# Patient Record
Sex: Male | Born: 1935
Health system: Southern US, Community
[De-identification: ages and names within clinical notes are randomized; demographics above are authoritative.]

## PROBLEM LIST (undated history)

## (undated) ENCOUNTER — Emergency Department (HOSPITAL_BASED_OUTPATIENT_CLINIC_OR_DEPARTMENT_OTHER): Payer: Medicare Other

## (undated) DIAGNOSIS — R001 Bradycardia, unspecified: Secondary | ICD-10-CM

## (undated) DIAGNOSIS — C61 Malignant neoplasm of prostate: Secondary | ICD-10-CM

## (undated) DIAGNOSIS — E785 Hyperlipidemia, unspecified: Secondary | ICD-10-CM

## (undated) DIAGNOSIS — J189 Pneumonia, unspecified organism: Secondary | ICD-10-CM

## (undated) DIAGNOSIS — I480 Paroxysmal atrial fibrillation: Secondary | ICD-10-CM

## (undated) DIAGNOSIS — I5042 Chronic combined systolic (congestive) and diastolic (congestive) heart failure: Secondary | ICD-10-CM

## (undated) DIAGNOSIS — I10 Essential (primary) hypertension: Secondary | ICD-10-CM

## (undated) DIAGNOSIS — M8580 Other specified disorders of bone density and structure, unspecified site: Secondary | ICD-10-CM

## (undated) DIAGNOSIS — Z8719 Personal history of other diseases of the digestive system: Secondary | ICD-10-CM

## (undated) DIAGNOSIS — N182 Chronic kidney disease, stage 2 (mild): Secondary | ICD-10-CM

## (undated) DIAGNOSIS — I509 Heart failure, unspecified: Secondary | ICD-10-CM

## (undated) DIAGNOSIS — R609 Edema, unspecified: Secondary | ICD-10-CM

## (undated) DIAGNOSIS — E669 Obesity, unspecified: Secondary | ICD-10-CM

## (undated) DIAGNOSIS — C4490 Unspecified malignant neoplasm of skin, unspecified: Secondary | ICD-10-CM

## (undated) DIAGNOSIS — R0600 Dyspnea, unspecified: Secondary | ICD-10-CM

## (undated) DIAGNOSIS — R0609 Other forms of dyspnea: Secondary | ICD-10-CM

## (undated) DIAGNOSIS — Z950 Presence of cardiac pacemaker: Secondary | ICD-10-CM

## (undated) DIAGNOSIS — I2510 Atherosclerotic heart disease of native coronary artery without angina pectoris: Secondary | ICD-10-CM

## (undated) DIAGNOSIS — D126 Benign neoplasm of colon, unspecified: Secondary | ICD-10-CM

## (undated) DIAGNOSIS — J45909 Unspecified asthma, uncomplicated: Secondary | ICD-10-CM

## (undated) HISTORY — PX: C-EYE SURGERY PROCEDURE: 102257504

## (undated) HISTORY — PX: ANTERIOR CERVICAL DECOMP/DISCECTOMY FUSION: SHX1161

## (undated) HISTORY — PX: PROSTATE BIOPSY: SHX241

## (undated) HISTORY — PX: BACK SURGERY: SHX140

## (undated) HISTORY — DX: Essential (primary) hypertension: I10

## (undated) HISTORY — DX: Malignant neoplasm of prostate: C61

## (undated) HISTORY — PX: EYE SURGERY: SHX253

## (undated) HISTORY — PX: MOHS SURGERY: SUR867

## (undated) HISTORY — PX: TRANSURETHRAL RESECTION OF PROSTATE: SHX73

## (undated) HISTORY — DX: Obesity, unspecified: E66.9

## (undated) HISTORY — DX: Hyperlipidemia, unspecified: E78.5

## (undated) HISTORY — DX: Edema, unspecified: R60.9

## (undated) HISTORY — DX: Atherosclerotic heart disease of native coronary artery without angina pectoris: I25.10

## (undated) HISTORY — PX: PACEMAKER INSERTION: SHX728

## (undated) HISTORY — PX: CATARACT EXTRACTION: SUR2

## (undated) HISTORY — DX: Benign neoplasm of colon, unspecified: D12.6

## (undated) HISTORY — DX: Other specified disorders of bone density and structure, unspecified site: M85.80

## (undated) HISTORY — DX: Other forms of dyspnea: R06.09

---

## 2000-08-02 ENCOUNTER — Emergency Department (HOSPITAL_COMMUNITY): Admission: EM | Admit: 2000-08-02 | Discharge: 2000-08-02 | Payer: Self-pay | Admitting: Emergency Medicine

## 2000-08-02 ENCOUNTER — Encounter: Payer: Self-pay | Admitting: Emergency Medicine

## 2002-08-06 ENCOUNTER — Encounter: Payer: Self-pay | Admitting: Urology

## 2002-08-09 ENCOUNTER — Observation Stay (HOSPITAL_COMMUNITY): Admission: RE | Admit: 2002-08-09 | Discharge: 2002-08-10 | Payer: Self-pay | Admitting: Urology

## 2002-08-09 ENCOUNTER — Encounter (INDEPENDENT_AMBULATORY_CARE_PROVIDER_SITE_OTHER): Payer: Self-pay

## 2002-11-16 ENCOUNTER — Ambulatory Visit (HOSPITAL_COMMUNITY): Admission: RE | Admit: 2002-11-16 | Discharge: 2002-11-16 | Payer: Self-pay | Admitting: Urology

## 2002-11-16 ENCOUNTER — Encounter: Payer: Self-pay | Admitting: Urology

## 2002-12-16 ENCOUNTER — Encounter: Payer: Self-pay | Admitting: Emergency Medicine

## 2002-12-16 ENCOUNTER — Emergency Department (HOSPITAL_COMMUNITY): Admission: EM | Admit: 2002-12-16 | Discharge: 2002-12-16 | Payer: Self-pay | Admitting: Emergency Medicine

## 2004-07-11 ENCOUNTER — Ambulatory Visit: Payer: Self-pay | Admitting: Gastroenterology

## 2006-04-10 ENCOUNTER — Emergency Department (HOSPITAL_COMMUNITY): Admission: EM | Admit: 2006-04-10 | Discharge: 2006-04-10 | Payer: Self-pay | Admitting: Emergency Medicine

## 2006-05-16 ENCOUNTER — Ambulatory Visit: Payer: Self-pay

## 2007-04-13 ENCOUNTER — Emergency Department (HOSPITAL_COMMUNITY): Admission: EM | Admit: 2007-04-13 | Discharge: 2007-04-13 | Payer: Self-pay | Admitting: Emergency Medicine

## 2008-09-12 ENCOUNTER — Ambulatory Visit (HOSPITAL_COMMUNITY): Admission: RE | Admit: 2008-09-12 | Discharge: 2008-09-12 | Payer: Self-pay | Admitting: Family Medicine

## 2009-06-28 ENCOUNTER — Encounter (INDEPENDENT_AMBULATORY_CARE_PROVIDER_SITE_OTHER): Payer: Self-pay | Admitting: *Deleted

## 2009-10-04 ENCOUNTER — Emergency Department (HOSPITAL_COMMUNITY): Admission: EM | Admit: 2009-10-04 | Discharge: 2009-10-04 | Payer: Self-pay | Admitting: Emergency Medicine

## 2009-12-04 ENCOUNTER — Encounter: Payer: Self-pay | Admitting: Gastroenterology

## 2009-12-04 DIAGNOSIS — Z86010 Personal history of colonic polyps: Secondary | ICD-10-CM

## 2009-12-08 ENCOUNTER — Telehealth: Payer: Self-pay | Admitting: Gastroenterology

## 2009-12-13 ENCOUNTER — Encounter: Payer: Self-pay | Admitting: Cardiovascular Disease

## 2009-12-25 ENCOUNTER — Telehealth: Payer: Self-pay | Admitting: Gastroenterology

## 2009-12-27 DIAGNOSIS — R5383 Other fatigue: Secondary | ICD-10-CM

## 2009-12-27 DIAGNOSIS — J40 Bronchitis, not specified as acute or chronic: Secondary | ICD-10-CM

## 2009-12-27 DIAGNOSIS — D126 Benign neoplasm of colon, unspecified: Secondary | ICD-10-CM | POA: Insufficient documentation

## 2009-12-27 DIAGNOSIS — M949 Disorder of cartilage, unspecified: Secondary | ICD-10-CM

## 2009-12-27 DIAGNOSIS — J438 Other emphysema: Secondary | ICD-10-CM | POA: Insufficient documentation

## 2009-12-27 DIAGNOSIS — E785 Hyperlipidemia, unspecified: Secondary | ICD-10-CM | POA: Insufficient documentation

## 2009-12-27 DIAGNOSIS — M899 Disorder of bone, unspecified: Secondary | ICD-10-CM | POA: Insufficient documentation

## 2009-12-27 DIAGNOSIS — R5381 Other malaise: Secondary | ICD-10-CM | POA: Insufficient documentation

## 2009-12-27 DIAGNOSIS — R071 Chest pain on breathing: Secondary | ICD-10-CM

## 2009-12-27 DIAGNOSIS — I10 Essential (primary) hypertension: Secondary | ICD-10-CM | POA: Insufficient documentation

## 2009-12-27 DIAGNOSIS — C61 Malignant neoplasm of prostate: Secondary | ICD-10-CM

## 2009-12-27 DIAGNOSIS — R079 Chest pain, unspecified: Secondary | ICD-10-CM

## 2009-12-27 DIAGNOSIS — I5030 Unspecified diastolic (congestive) heart failure: Secondary | ICD-10-CM | POA: Insufficient documentation

## 2009-12-28 ENCOUNTER — Ambulatory Visit: Payer: Self-pay | Admitting: Cardiovascular Disease

## 2010-01-04 ENCOUNTER — Ambulatory Visit (HOSPITAL_COMMUNITY): Admission: RE | Admit: 2010-01-04 | Discharge: 2010-01-04 | Payer: Self-pay | Admitting: Cardiovascular Disease

## 2010-01-04 ENCOUNTER — Encounter (INDEPENDENT_AMBULATORY_CARE_PROVIDER_SITE_OTHER): Payer: Self-pay | Admitting: *Deleted

## 2010-01-04 ENCOUNTER — Ambulatory Visit: Payer: Self-pay | Admitting: Internal Medicine

## 2010-01-04 ENCOUNTER — Ambulatory Visit: Payer: Self-pay

## 2010-01-04 ENCOUNTER — Ambulatory Visit: Payer: Self-pay | Admitting: Cardiovascular Disease

## 2010-01-04 ENCOUNTER — Encounter: Payer: Self-pay | Admitting: Cardiovascular Disease

## 2010-02-09 ENCOUNTER — Ambulatory Visit: Payer: Self-pay | Admitting: Internal Medicine

## 2010-02-12 ENCOUNTER — Encounter: Payer: Self-pay | Admitting: Internal Medicine

## 2010-04-05 ENCOUNTER — Ambulatory Visit: Payer: Self-pay | Admitting: Cardiovascular Disease

## 2010-08-02 DIAGNOSIS — I2510 Atherosclerotic heart disease of native coronary artery without angina pectoris: Secondary | ICD-10-CM

## 2010-08-02 HISTORY — DX: Atherosclerotic heart disease of native coronary artery without angina pectoris: I25.10

## 2010-08-20 ENCOUNTER — Inpatient Hospital Stay (HOSPITAL_COMMUNITY)
Admission: EM | Admit: 2010-08-20 | Discharge: 2010-08-28 | Payer: Self-pay | Source: Home / Self Care | Attending: Thoracic Surgery (Cardiothoracic Vascular Surgery) | Admitting: Thoracic Surgery (Cardiothoracic Vascular Surgery)

## 2010-08-21 ENCOUNTER — Encounter: Payer: Self-pay | Admitting: Thoracic Surgery (Cardiothoracic Vascular Surgery)

## 2010-08-22 ENCOUNTER — Encounter: Payer: Self-pay | Admitting: Thoracic Surgery (Cardiothoracic Vascular Surgery)

## 2010-08-23 HISTORY — PX: CORONARY ARTERY BYPASS GRAFT: SHX141

## 2010-08-23 HISTORY — PX: MEDIAN STERNOTOMY: SUR860

## 2010-08-31 ENCOUNTER — Ambulatory Visit: Admission: RE | Admit: 2010-08-31 | Discharge: 2010-08-31 | Payer: Self-pay | Source: Home / Self Care

## 2010-09-04 ENCOUNTER — Encounter: Payer: Self-pay | Admitting: Cardiovascular Disease

## 2010-09-10 ENCOUNTER — Ambulatory Visit
Admission: RE | Admit: 2010-09-10 | Discharge: 2010-09-10 | Payer: Self-pay | Source: Home / Self Care | Attending: Thoracic Surgery (Cardiothoracic Vascular Surgery) | Admitting: Thoracic Surgery (Cardiothoracic Vascular Surgery)

## 2010-09-10 ENCOUNTER — Encounter: Payer: Self-pay | Admitting: Cardiovascular Disease

## 2010-09-10 ENCOUNTER — Encounter
Admission: RE | Admit: 2010-09-10 | Discharge: 2010-09-10 | Payer: Self-pay | Source: Home / Self Care | Attending: Thoracic Surgery (Cardiothoracic Vascular Surgery) | Admitting: Thoracic Surgery (Cardiothoracic Vascular Surgery)

## 2010-09-10 ENCOUNTER — Ambulatory Visit: Admission: RE | Admit: 2010-09-10 | Discharge: 2010-09-10 | Payer: Self-pay | Source: Home / Self Care

## 2010-09-10 ENCOUNTER — Encounter: Payer: Self-pay | Admitting: Cardiology

## 2010-09-17 ENCOUNTER — Ambulatory Visit: Admission: RE | Admit: 2010-09-17 | Discharge: 2010-09-17 | Payer: Self-pay | Source: Home / Self Care

## 2010-09-17 LAB — CONVERTED CEMR LAB: POC INR: 1.3

## 2010-09-20 ENCOUNTER — Ambulatory Visit
Admission: RE | Admit: 2010-09-20 | Discharge: 2010-09-20 | Payer: Self-pay | Source: Home / Self Care | Attending: Cardiovascular Disease | Admitting: Cardiovascular Disease

## 2010-09-20 ENCOUNTER — Encounter: Payer: Self-pay | Admitting: Cardiovascular Disease

## 2010-09-20 DIAGNOSIS — I480 Paroxysmal atrial fibrillation: Secondary | ICD-10-CM | POA: Insufficient documentation

## 2010-09-24 ENCOUNTER — Ambulatory Visit: Admit: 2010-09-24 | Payer: Self-pay

## 2010-09-24 ENCOUNTER — Telehealth: Payer: Self-pay | Admitting: Cardiovascular Disease

## 2010-09-27 ENCOUNTER — Encounter (HOSPITAL_COMMUNITY)
Admission: RE | Admit: 2010-09-27 | Discharge: 2010-10-02 | Payer: Self-pay | Source: Home / Self Care | Attending: Cardiovascular Disease | Admitting: Cardiovascular Disease

## 2010-09-27 ENCOUNTER — Telehealth (INDEPENDENT_AMBULATORY_CARE_PROVIDER_SITE_OTHER): Payer: Self-pay | Admitting: *Deleted

## 2010-10-02 NOTE — Letter (Signed)
Summary: Outpatient Coinsurance Notice  Outpatient Coinsurance Notice   Imported By: Marylou Mccoy 01/05/2010 15:08:18  _____________________________________________________________________  External Attachment:    Type:   Image     Comment:   External Document

## 2010-10-02 NOTE — Letter (Signed)
Summary: [facility] Instructions  [facility]  [address] [place], [place] [address]   Phone: [phone]  Fax: [phone]       DAVIDMICHAEL ZARAZUA    28-Dec-1928    MRN: [id number]        Procedure Day /Date: Thursday 12/14/2009     Arrival Time: 10:30am       Procedure Time: 11:30am     Location of Procedure:                     [facility] (4th Floor)                        PREPARATION FOR COLONOSCOPY WITH MOVIPREP   Starting 5 days prior to your procedure 4/9/201 do not eat nuts, seeds, popcorn, corn, beans, peas,  salads, or any raw vegetables.  Do not take any fiber supplements (e.g. Metamucil, Citrucel, and Benefiber).  THE DAY BEFORE YOUR PROCEDURE         DATE: 12/13/2009  DAY:  Wednesday   1.  Drink clear liquids the entire day-NO SOLID FOOD  2.  Do not drink anything colored red or purple.  Avoid juices with pulp.  No orange juice.  3.  Drink at least 64 oz. (8 glasses) of fluid/clear liquids during the day to prevent dehydration and help the prep work efficiently.  CLEAR LIQUIDS INCLUDE: Water Jello Ice Popsicles Tea (sugar ok, no milk/cream) Powdered fruit flavored drinks Coffee (sugar ok, no milk/cream) Gatorade Juice: apple, white grape, white cranberry  Lemonade Clear bullion, consomm, broth Carbonated beverages (any kind) Strained chicken noodle soup Hard Candy                             4.  In the morning, mix first dose of MoviPrep solution:    Empty 1 Pouch A and 1 Pouch B into the disposable container    Add lukewarm drinking water to the top line of the container. Mix to dissolve    Refrigerate (mixed solution should be used within 24 hrs)  5.  Begin drinking the prep at 5:00 p.m. The MoviPrep container is divided by 4 marks.   Every 15 minutes drink the solution down to the next mark (approximately 8 oz) until the full liter is complete.   6.  Follow completed prep with 16 oz of clear liquid of your choice  (Nothing red or purple).  Continue to drink clear liquids until bedtime.  7.  Before going to bed, mix second dose of MoviPrep solution:    Empty 1 Pouch A and 1 Pouch B into the disposable container    Add lukewarm drinking water to the top line of the container. Mix to dissolve    Refrigerate  THE DAY OF YOUR PROCEDURE      DATE: 12/14/2009 DAY: Thursday  Beginning at 6:30a.m. (5 hours before procedure):         1. Every 15 minutes, drink the solution down to the next mark (approx 8 oz) until the full liter is complete.  2. Follow completed prep with 16 oz. of clear liquid of your choice.    3. You may drink clear liquids until 9:30a.m. (2 HOURS BEFORE PROCEDURE).   MEDICATION INSTRUCTIONS  Unless otherwise instructed, you should take regular prescription medications with a small sip of water   as early as possible the morning of your  procedure.         OTHER INSTRUCTIONS  You will need a responsible adult at least 75 years of age to accompany you and drive you home.   This person must remain in the waiting room during your procedure.  Wear loose fitting clothing that is easily removed.  Leave jewelry and other valuables at home.  However, you may wish to bring a book to read or  an iPod/MP3 player to listen to music as you wait for your procedure to start.  Remove all body piercing jewelry and leave at home.  Total time from sign-in until discharge is approximately 2-3 hours.  You should go home directly after your procedure and rest.  You can resume normal activities the  day after your procedure.   The day of your procedure you should not:   Drive   Make legal decisions   Operate machinery   Drink alcohol   Return to work  You will receive specific instructions about eating, activities and medications before you leave.   The above instructions have been reviewed and explained to me by   Harlow Mares CMA Duncan Dull)  December 04, 2009 10:25 AM      I  fully understand and can verbalize these instructions _____________________________ Date 12/04/2009

## 2010-10-02 NOTE — Progress Notes (Signed)
Summary: TRIAGE-SOB/Colonoscopy Cx.  Phone Note Call from Patient Call back at Home Phone [phone]   Caller: wife, Windell Moulding Call For: DR.KAPLAN Reason for Call: Talk to Nurse Complaint: Breathing Problems Summary of Call: worried about health condition before having procedure... pt having trouble breathing Initial call taken by: Vallarie Mare,  December 08, 2009 11:14 AM  Follow-up for Phone Call        DR.KAPLAN--Pt. wife states pt. is having episodes of fatigue and SOB, seems to be getting worse. I have advised Mrs.Arviso to get pt. scheduled w/his PCP ASAP for eval. of c/o. Pt. is scheduled for a Colonoscopy on 12-14-09, do you want him to procede with appt., or r/s until after c/o have been evaluated by PCP?  Follow-up by: Laureen Ochs LPN,  December 08, 2009 11:48 AM  Additional Follow-up for Phone Call Additional follow up Details #1::        wife called back and said that pt told her to just cancel the COL until he sees a cardiologist... informed wife that she was going to be getting a call back from our office regarding this issue and advise pt due to the original message taken... wife said she didnt want to wait and was sure in pt's decision to cancel... procedure is cancelled in [facility]  Additional Follow-up by: Vallarie Mare,  December 08, 2009 2:06 PM    Additional Follow-up for Phone Call Additional follow up Details #2::    ok Follow-up by: Louis Meckel MD,  December 11, 2009 8:37 AM  Additional Follow-up for Phone Call Additional follow up Details #3:: Details for Additional Follow-up Action Taken: Pt. wife advised to callback to reschedule Colonoscopy when they are ready. Additional Follow-up by: Laureen Ochs LPN,  December 11, 2009 8:40 AM

## 2010-10-02 NOTE — Procedures (Signed)
Summary: Colonoscopy  Patient: Levi [name] Note: All result statuses are Final unless otherwise noted.  Tests: (1) Colonoscopy (COL)   COL Colonoscopy           DONE     [facility]     [address]     [place], [place]  [address]           COLONOSCOPY PROCEDURE REPORT           PATIENT:  Levi [name], Levi [name]  MR#:  [id number]     BIRTHDATE:  08/11/1929, 80 yrs. old  GENDER:  male     ENDOSCOPIST:  Hedwig Morton. Juanda Chance, MD     REF. BY:     PROCEDURE DATE:  02/09/2010     PROCEDURE:  Colonoscopy 19147     ASA CLASS:  Class II     INDICATIONS:  surveillance and high-risk screening adenom polyps     2005 x2     MEDICATIONS:   Versed 10 mg, Fentanyl 100 mcg           DESCRIPTION OF PROCEDURE:   After the risks benefits and     alternatives of the procedure were thoroughly explained, informed     consent was obtained.  Digital rectal exam was performed and     revealed no rectal masses.   The [id number] endoscope was     introduced through the anus and advanced to the cecum, which was     identified by the ileocecal valve, without limitations.  The     quality of the prep was good, using MiraLax.  The instrument was     then slowly withdrawn as the colon was fully examined.     <<PROCEDUREIMAGES>>           FINDINGS:  There were multiple polyps identified and removed.     throughout the colon. 2 polyps right colon at 85 and 90 cm,     3 diminutive polyps at 20 cm The polyps were removed using cold     biopsy forceps. Polyp was snared without cautery. Retrieval was     successful (see image2, image3, and image9). snare polyp  Mild     diverticulosis was found in the sigmoid colon (see image1).     Internal hemorrhoids were found (see image10).  This was otherwise     a normal examination of the colon (see image8, image7, image6,     image5, and image4). unable to visualize appendiceal opening     Retroflexed views in the rectum revealed no abnormalities.    The     scope was  then withdrawn from the patient and the procedure     completed.           COMPLICATIONS:  None     ENDOSCOPIC IMPRESSION:     1) Polyps, multiple throughout the colon     2) Mild diverticulosis in the sigmoid colon     3) Internal hemorrhoids     4) Otherwise normal examination     RECOMMENDATIONS:     1) Await pathology results     REPEAT EXAM:  In 5 year(s) for.           ______________________________     Hedwig Morton. Juanda Chance, MD           CC:           n.     eSIGNED:   Hedwig Morton. Brodie at 02/09/2010 08:47 AM  Levi [name], Levi [name], [id number]  Note: An exclamation mark (!) indicates a result that was not dispersed into the flowsheet. Document Creation Date: 02/09/2010 8:48 AM _______________________________________________________________________  (1) Order result status: Final Collection or observation date-time: 02/09/2010 08:32 Requested date-time:  Receipt date-time:  Reported date-time:  Referring Physician:   Ordering Physician: Lina Sar [phone]) Specimen Source:  Source: Launa Grill Order Number: [id number] Lab site:   Appended Document: Colonoscopy     Procedures Next Due Date:    Colonoscopy: 02/2015

## 2010-10-02 NOTE — Consult Note (Signed)
Summary: [facility]  [facility]   Imported By: Marylou Mccoy 12/27/2009 12:46:06  _____________________________________________________________________  External Attachment:    Type:   Image     Comment:   External Document

## 2010-10-02 NOTE — Miscellaneous (Signed)
Summary: Orders Update  Clinical Lists Changes  Problems: Added new problem of COLONIC POLYPS, ADENOMATOUS, HX OF (ICD-V12.72) Medications: Added new medication of MOVIPREP 100 GM  SOLR (PEG-KCL-NACL-NASULF-NA ASC-C) As per prep instructions. - Signed Rx of MOVIPREP 100 GM  SOLR (PEG-KCL-NACL-NASULF-NA ASC-C) As per prep instructions.;  #1 x 0;  Signed;  Entered by: Harlow Mares CMA (AAMA);  Authorized by: Louis Meckel MD;  Method used: Electronically to [facility]*, [address], [place], [place]  [address], Ph: [phone] or [phone], Fax: [phone] Orders: Added new Test order of Colonoscopy (Colon) - Signed    Prescriptions: MOVIPREP 100 GM  SOLR (PEG-KCL-NACL-NASULF-NA ASC-C) As per prep instructions.  #1 x 0   Entered by:   Harlow Mares CMA (AAMA)   Authorized by:   Louis Meckel MD   Signed by:   Harlow Mares CMA (AAMA) on 12/04/2009   Method used:   Electronically to        [facility]* (retail)       [address] [place], [place]  [address]       Ph: [phone] or [phone]       Fax: [phone]   RxID:   [id number]

## 2010-10-02 NOTE — Progress Notes (Signed)
Summary: [facility]  Phone Note Other Incoming   Summary of Call: Patient was in the office with his wife today to see Dr Juanda Chance. Patient is due for a colonoscopy but when he called to schedule it, he was told that he would have procedure with Dr Arlyce Dice, so he cancelled. Patient is adament that he saw Dr Juanda Chance for his colonoscopy although our records clearly indicate otherwise. Dr Arlyce Dice, since patient wants further procedures completed by Dr Juanda Chance, are you okay with him switching to her care? Initial call taken by: Lamona Curl CMA Duncan Dull),  December 25, 2009 11:15 AM  Follow-up for Phone Call        ok Follow-up by: Louis Meckel MD,  December 25, 2009 2:40 PM  Additional Follow-up for Phone Call Additional follow up Details #1::        Dr Juanda Chance,  Will you accept patient? Additional Follow-up by: Lamona Curl CMA Duncan Dull),  December 25, 2009 3:00 PM    Additional Follow-up for Phone Call Additional follow up Details #2::    OK Follow-up by: Hart Carwin MD,  December 25, 2009 7:20 PM

## 2010-10-02 NOTE — Assessment & Plan Note (Signed)
Summary: 3 month rov.sl   Primary Provider:  Dr Tracey Harries  CC:  sob.  History of Present Illness: Levi [name] is seen today at the request of Dr Everlene Other for progressive dyspnea.  He has HTN, elevated lipids and quit smoking over 30 years ago.  He has gained over 30 lbs the last 2 years and is quite heavy.  He has PFT's apparantly that showed decreased FVC with ? emphysema on CXR.  He had no significant improvement on spireva.  He had some LE edema when on Ca blocker but this is improved.  He denies history of CAD, valvular heart disease, cough fever sputum.  He has not had any previous cardiac w/u.  He has progressive fatigue, exertional dyspnea.  No PND or orthopnea.  No audible wheezing.  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  He has mild LVH normal EF, no significant valve disease and normal RV size.  There was no evidence of pulmonary HTN.  We had a long talk about his diet and weight.  I will see him back in 3 months but do not think further cardiac testing is indicated at this time.  Further lung w/u will be per Dr Everlene Other.  ? apnea testing  Current Problems (verified): 1)  Hypertension  (ICD-401.9) 2)  Hyperlipidemia  (ICD-272.4) 3)  Chest Pain Unspecified  (ICD-786.50) 4)  Painful Respiration  (ICD-786.52) 5)  Edema  (ICD-782.3) 6)  Adenomatous Colonic Polyp  (ICD-211.3) 7)  Dyspnea On Exertion  (ICD-786.09) 8)  Prostate Cancer  (ICD-185) 9)  Osteopenia  (ICD-733.90) 10)  Emphysema  (ICD-492.8) 11)  Bronchitis  (ICD-490) 12)  Colonic Polyps, Adenomatous, Hx of  (ICD-V12.72) 13)  Obesity  (ICD-278.00) 14)  Malaise and Fatigue  (ICD-780.79)  Current Medications (verified): 1)  Hydrochlorothiazide 25 Mg Tabs (Hydrochlorothiazide) .... Take One Tablet By Mouth Daily. 2)  Benazepril Hcl 40 Mg Tabs (Benazepril Hcl) .Marland Kitchen.. 1 Tab By Mouth Once Daily 3)  Fish Oil   Oil (Fish Oil) .Marland Kitchen.. 1  Tab By Mouth Once Daily 4)  Calcium  (Calcium Carbonate) .... 2 Tabs By Mouth Once Daily  Allergies (verified): No Known Drug Allergies  Past History:  Past Medical History: Last updated: 12/27/2009 Current Problems:  HYPERTENSION (ICD-401.9) HYPERLIPIDEMIA (ICD-272.4) CHEST PAIN UNSPECIFIED (ICD-786.50) PAINFUL RESPIRATION (ICD-786.52) EDEMA (ICD-782.3) ADENOMATOUS COLONIC POLYP (ICD-211.3) DYSPNEA ON EXERTION (ICD-786.09) PROSTATE CANCER (ICD-185) OSTEOPENIA (ICD-733.90) EMPHYSEMA (ICD-492.8) BRONCHITIS (ICD-490) COLONIC POLYPS, ADENOMATOUS, HX OF (ICD-V12.72) OBESITY (ICD-278.00) MALAISE AND FATIGUE (ICD-780.79)  Past Surgical History: Last updated: 12/27/2009 cervical disk surgery  Family History: Last updated: 12/27/2009 Brother(2) Heart disease and skin cancer Brother(1) Heart disease Brother (3) Prostate cancer Father kidney cancer and heart attack  Social History: Last updated: 12/28/2009 former smoker, non-drinker, no drug abuse 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   Vital Signs:  Patient profile:   77 year old male Height:      70 inches Weight:      265 pounds BMI:     38.16 Pulse rate:   70 / minute Resp:     14 per minute BP sitting:   140 / 80  (left arm)  Vitals Entered By: Kem Parkinson (April 05, 2010 10:58 AM)  Physical Exam  General:  Affect appropriate Healthy:  appears stated age HEENT: normal Neck supple with no adenopathy JVP normal no bruits no thyromegaly Lungs clear with no wheezing and good diaphragmatic motion Heart:  S1/S2 no murmur,rub, gallop or click PMI normal Abdomen: benighn, BS positve, no tenderness, no AAA no bruit.  No HSM or HJR Distal pulses intact  with no bruits Plus one bilateral  edema Neuro non-focal Skin warm and dry    Impression & Recommendations:  Problem # 1:  HYPERTENSION (ICD-401.9) Well controlled His updated medication list for this problem includes:    Hydrochlorothiazide 25 Mg Tabs (Hydrochlorothiazide) .Marland Kitchen... Take one tablet by mouth daily.    Benazepril Hcl 40 Mg Tabs (Benazepril hcl) .Marland Kitchen... 1 tab by mouth once daily  Problem # 2:  HYPERLIPIDEMIA (ICD-272.4) F/U [place]  continue low chol diet  Problem # 3:  EDEMA (ICD-782.3) Continue current dose of diuretic.  Weight loss and low sodium diet  Problem # 4:  DYSPNEA ON EXERTION (ICD-786.09) In my mind clearly related to marked weight gain and obesity.  Discussed low carb [place] type diet.  Also previous smoker with moderate COPD.  F/U nutritionist and [facility] for Rx like Advair His updated medication list for this problem includes:    Hydrochlorothiazide 25 Mg Tabs (Hydrochlorothiazide) .Marland Kitchen... Take one tablet by mouth daily.    Benazepril Hcl 40 Mg Tabs (Benazepril hcl) .Marland Kitchen... 1 tab by mouth once daily  Patient Instructions: 1)  Your physician recommends that you schedule a follow-up appointment in: ONE YEAR

## 2010-10-02 NOTE — Assessment & Plan Note (Signed)
Summary: ok per nurse/pt having echo @ 1pm/saf   Visit Type:  Follow-up Primary Provider:  Dr Tracey Harries  CC:  Pt just had echo- t having sob  and headaches and chest pain.  History of Present Illness: Levi [name] is seen today at the request of Dr Everlene Other for progressive dyspnea.  He has HTN, elevated lipids and quit smoking over 30 years ago.  He has gained over 30 lbs the last 2 years and is quite heavy.  He has PFT's apparantly that showed decreased FVC with ? emphysema on CXR.  He had no significant improvement on spireva.  He had some LE edema when on Ca blocker but this is improved.  He denies history of CAD, valvular heart disease, cough fever sputum.  He has not had any previous cardiac w/u.  He has progressive fatigue, exertional dyspnea.  No PND or orthopnea.  No audible wheezing.  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  He has mild LVH normal EF, no significant valve disease and normal RV size.  There was no evidence of pulmonary HTN.  We had a long talk about his diet and weight.  I will see him back in 3 months but do not think further cardiac testing is indicated at this time.  Further lung w/u will be per Dr Everlene Other.  ? apnea testing  Current Problems (verified): 1)  Hypertension  (ICD-401.9) 2)  Hyperlipidemia  (ICD-272.4) 3)  Chest Pain Unspecified  (ICD-786.50) 4)  Painful Respiration  (ICD-786.52) 5)  Edema  (ICD-782.3) 6)  Adenomatous Colonic Polyp  (ICD-211.3) 7)  Dyspnea On Exertion  (ICD-786.09) 8)  Prostate Cancer  (ICD-185) 9)  Osteopenia  (ICD-733.90) 10)  Emphysema  (ICD-492.8) 11)  Bronchitis  (ICD-490) 12)  Colonic Polyps, Adenomatous, Hx of  (ICD-V12.72) 13)  Obesity  (ICD-278.00) 14)  Malaise and Fatigue  (ICD-780.79)  Current Medications (verified): 1)  Hydrochlorothiazide 25 Mg Tabs (Hydrochlorothiazide) .... Take One Tablet By Mouth Daily. 2)   Benazepril Hcl 40 Mg Tabs (Benazepril Hcl) .Marland Kitchen.. 1 Tab By Mouth Once Daily 3)  Fish Oil   Oil (Fish Oil) .Marland Kitchen.. 1 Tab By Mouth Once Daily 4)  Calcium  (Calcium Carbonate) .... 2 Tabs By Mouth Once Daily  Allergies (verified): No Known Drug Allergies  Past History:  Past Medical History: Last updated: 12/27/2009 Current Problems:  HYPERTENSION (ICD-401.9) HYPERLIPIDEMIA (ICD-272.4) CHEST PAIN UNSPECIFIED (ICD-786.50) PAINFUL RESPIRATION (ICD-786.52) EDEMA (ICD-782.3) ADENOMATOUS COLONIC POLYP (ICD-211.3) DYSPNEA ON EXERTION (ICD-786.09) PROSTATE CANCER (ICD-185) OSTEOPENIA (ICD-733.90) EMPHYSEMA (ICD-492.8) BRONCHITIS (ICD-490) COLONIC POLYPS, ADENOMATOUS, HX OF (ICD-V12.72) OBESITY (ICD-278.00) MALAISE AND FATIGUE (ICD-780.79)  Past Surgical History: Last updated: 12/27/2009 cervical disk surgery  Family History: Last updated: 12/27/2009 Brother(2) Heart disease and skin cancer Brother(1) Heart disease Brother (3) Prostate cancer Father kidney cancer and heart attack  Social History: Last updated: 12/28/2009 former smoker, non-drinker, no drug abuse 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   Vital Signs:  Patient profile:   77 year old male Height:      70 inches Weight:      268 pounds BMI:     38.59 Pulse rate:   70 /  minute BP sitting:   124 / 82  (left arm) Cuff size:   large  Vitals Entered By: Levi Kanaris, CNA (Jan 04, 2010 2:23 PM)  Physical Exam  General:  Affect appropriate Healthy:  appears stated age HEENT: normal Neck supple with no adenopathy JVP normal no bruits no thyromegaly Lungs clear with no wheezing and good diaphragmatic motion Heart:  S1/S2 no murmur,rub, gallop or click PMI  normal Abdomen: benighn, BS positve, no tenderness, no AAA no bruit.  No HSM or HJR Distal pulses intact with no bruits No edema Neuro non-focal Skin warm and dry    Impression & Recommendations:  Problem # 1:  HYPERTENSION (ICD-401.9) Well controlled His updated medication list for this problem includes:    Hydrochlorothiazide 25 Mg Tabs (Hydrochlorothiazide) .Marland Kitchen... Take one tablet by mouth daily.    Benazepril Hcl 40 Mg Tabs (Benazepril hcl) .Marland Kitchen... 1 tab by mouth once daily  BP today: 124/82 Prior BP: 136/78 (12/28/2009)  Problem # 2:  HYPERLIPIDEMIA (ICD-272.4) F/U [place].    Problem # 3:  DYSPNEA ON EXERTION (ICD-786.09) Does not appear to be cardiac.  F/U with me in 3 months.  Weight loss.   His updated medication list for this problem includes:    Hydrochlorothiazide 25 Mg Tabs (Hydrochlorothiazide) .Marland Kitchen... Take one tablet by mouth daily.    Benazepril Hcl 40 Mg Tabs (Benazepril hcl) .Marland Kitchen... 1 tab by mouth once daily  Patient Instructions: 1)  Your physician recommends that you schedule a follow-up appointment in: 3 months with Dr. Eden Emms 2)  Your physician recommends that you continue on your current medications as directed. Please refer to the Current Medication list given to you today.

## 2010-10-02 NOTE — Letter (Signed)
Summary: Patient Notice- Polyp Results  [facility]  [address] [place], [place] [address]   Phone: [phone]  Fax: [phone]        February 12, 2010 MRN: [id number]    Levi [name] [address] [place], [place]  [address]    Dear Levi [name],  I am pleased to inform you that the colon polyp(s) removed during your recent colonoscopy was (were) found to be benign (no cancer detected) upon pathologic examination.The polyps were adenomatous ( precancerous)  I recommend you have a repeat colonoscopy examination in 5_ years to look for recurrent polyps, as having colon polyps increases your risk for having recurrent polyps or even colon cancer in the future.  Should you develop new or worsening symptoms of abdominal pain, bowel habit changes or bleeding from the rectum or bowels, please schedule an evaluation with either your primary care physician or with me.  Additional information/recommendations:  _x_ No further action with gastroenterology is needed at this time. Please      follow-up with your primary care physician for your other healthcare      needs.  __ Please call [phone] to schedule a return visit to review your      situation.  __ Please keep your follow-up visit as already scheduled.  __ Continue treatment plan as outlined the day of your exam.  Please call us if you are having persistent problems or have questions about your condition that have not been fully answered at this time.  Sincerely,  Hart Carwin MD  This letter has been electronically signed by your physician.  Appended Document: Patient Notice- Polyp Results letter mailed 6.16.11

## 2010-10-02 NOTE — Assessment & Plan Note (Signed)
Summary: np3/DOE/jml   CC:  sob.  History of Present Illness: Levi [name] is seen today at the request of Dr Everlene Other for progressive dyspnea.  He has HTN, elevated lipids and quit smoking over 30 years ago.  He has gained over 30 lbs the last 2 years and is quite heavy.  He has PFT's apparantly that showed decreased FVC with ? emphysema on CXR.  He had no significant improvement on spireva.  He had some LE edema when on Ca blocker but this is improved.  He denies history of CAD, valvular heart disease, cough fever sputum.  He has not had any previous cardiac w/u.  He has progressive fatigue, exertional dyspnea.  No PND or orthopnea.  No audible wheezing.  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  Current Problems (verified): 1)  Hypertension  (ICD-401.9) 2)  Hyperlipidemia  (ICD-272.4) 3)  Chest Pain Unspecified  (ICD-786.50) 4)  Painful Respiration  (ICD-786.52) 5)  Edema  (ICD-782.3) 6)  Adenomatous Colonic Polyp  (ICD-211.3) 7)  Dyspnea On Exertion  (ICD-786.09) 8)  Prostate Cancer  (ICD-185) 9)  Osteopenia  (ICD-733.90) 10)  Emphysema  (ICD-492.8) 11)  Bronchitis  (ICD-490) 12)  Colonic Polyps, Adenomatous, Hx of  (ICD-V12.72) 13)  Obesity  (ICD-278.00) 14)  Malaise and Fatigue  (ICD-780.79)  Current Medications (verified): 1)  Hydrochlorothiazide 25 Mg Tabs (Hydrochlorothiazide) .... Take One Tablet By Mouth Daily. 2)  Benazepril Hcl 40 Mg Tabs (Benazepril Hcl) .Marland Kitchen.. 1 Tab By Mouth Once Daily 3)  Fish Oil   Oil (Fish Oil) .Marland Kitchen.. 1 Tab By Mouth Once Daily 4)  Calcium  (Calcium Carbonate) .... 2 Tabs By Mouth Once Daily  Allergies (verified): No Known Drug Allergies  Past History:  Past Medical History: Last updated: 12/27/2009 Current Problems:  HYPERTENSION (ICD-401.9) HYPERLIPIDEMIA (ICD-272.4) CHEST PAIN UNSPECIFIED (ICD-786.50) PAINFUL RESPIRATION (ICD-786.52) EDEMA (ICD-782.3) ADENOMATOUS COLONIC POLYP  (ICD-211.3) DYSPNEA ON EXERTION (ICD-786.09) PROSTATE CANCER (ICD-185) OSTEOPENIA (ICD-733.90) EMPHYSEMA (ICD-492.8) BRONCHITIS (ICD-490) COLONIC POLYPS, ADENOMATOUS, HX OF (ICD-V12.72) OBESITY (ICD-278.00) MALAISE AND FATIGUE (ICD-780.79)  Past Surgical History: Last updated: 12/27/2009 cervical disk surgery  Family History: Last updated: 12/27/2009 Brother(2) Heart disease and skin cancer Brother(1) Heart disease Brother (3) Prostate cancer Father kidney cancer and heart attack  Social History: Last updated: 12/28/2009 formersmoker, non-drinker, no drug abuse 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   Vital Signs:  Patient profile:   77 year old male Height:      70 inches Weight:      270 pounds BMI:     38.88 Pulse rate:   67 / minute Resp:     14 per minute BP sitting:   136 / 78  (left arm)  Vitals Entered By: Kem Parkinson (December 28, 2009 3:08 PM)  Physical Exam  General:  Affect appropriate Healthy:  appears stated age HEENT: normal Neck supple with no adenopathy JVP normal no bruits no thyromegaly Lungs clear with no wheezing and good diaphragmatic motion Heart:  S1/S2 no murmur,rub, gallop or click PMI normal Abdomen: benighn, BS positve, no tenderness, no AAA no bruit.  No HSM or HJR  Distal pulses intact with no bruits No edema Neuro non-focal Skin warm and dry    Impression & Recommendations:  Problem # 1:  HYPERTENSION (ICD-401.9) Well contorlled continue low sodium diet His updated medication list for this problem includes:    Hydrochlorothiazide 25 Mg Tabs  (Hydrochlorothiazide) .Marland Kitchen... Take one tablet by mouth daily.    Benazepril Hcl 40 Mg Tabs (Benazepril hcl) .Marland Kitchen... 1 tab by mouth once daily  Problem # 2:  HYPERLIPIDEMIA (ICD-272.4) F/U labs [place].  Target LDL under 130 with no documented vascular disease  Problem # 3:  EDEMA (ICD-782.3) Improved off calcium blocker.  Likely some dependant edema.  Depending on cardiac w/u may benefit from diuretic  Problem # 4:  DYSPNEA ON EXERTION (ICD-786.09) Likely from lung disease and deconditioning with marked weight gain.  Check echo for starters to assess RV and LV funciton His updated medication list for this problem includes:    Hydrochlorothiazide 25 Mg Tabs (Hydrochlorothiazide) .Marland Kitchen... Take one tablet by mouth daily.    Benazepril Hcl 40 Mg Tabs (Benazepril hcl) .Marland Kitchen... 1 tab by mouth once daily  Orders: Echocardiogram (Echo)  Patient Instructions: 1)  Your physician recommends that you schedule a follow-up appointment next week on day of echo 2)  Your physician has requested that you have an echocardiogram.  Echocardiography is a painless test that uses sound waves to create images of your heart. It provides your doctor with information about the size and shape of your heart and how well your heart's chambers and valves are working.  This procedure takes approximately one hour. There are no restrictions for this procedure. To be done next week and appt with Dr. Eden Emms same day   EKG Report  Procedure date:  12/28/2009  Findings:      NSR 66 RBBB/LAFB LVH Abnormal ECG

## 2010-10-03 ENCOUNTER — Encounter (HOSPITAL_COMMUNITY): Payer: Medicare Other | Attending: Cardiovascular Disease

## 2010-10-03 DIAGNOSIS — Z7901 Long term (current) use of anticoagulants: Secondary | ICD-10-CM | POA: Insufficient documentation

## 2010-10-03 DIAGNOSIS — I200 Unstable angina: Secondary | ICD-10-CM | POA: Insufficient documentation

## 2010-10-03 DIAGNOSIS — Z87891 Personal history of nicotine dependence: Secondary | ICD-10-CM | POA: Insufficient documentation

## 2010-10-03 DIAGNOSIS — I4891 Unspecified atrial fibrillation: Secondary | ICD-10-CM | POA: Insufficient documentation

## 2010-10-03 DIAGNOSIS — Z7982 Long term (current) use of aspirin: Secondary | ICD-10-CM | POA: Insufficient documentation

## 2010-10-03 DIAGNOSIS — I2510 Atherosclerotic heart disease of native coronary artery without angina pectoris: Secondary | ICD-10-CM | POA: Insufficient documentation

## 2010-10-03 DIAGNOSIS — I10 Essential (primary) hypertension: Secondary | ICD-10-CM | POA: Insufficient documentation

## 2010-10-03 DIAGNOSIS — E669 Obesity, unspecified: Secondary | ICD-10-CM | POA: Insufficient documentation

## 2010-10-03 DIAGNOSIS — Z951 Presence of aortocoronary bypass graft: Secondary | ICD-10-CM | POA: Insufficient documentation

## 2010-10-03 DIAGNOSIS — E119 Type 2 diabetes mellitus without complications: Secondary | ICD-10-CM | POA: Insufficient documentation

## 2010-10-03 DIAGNOSIS — C61 Malignant neoplasm of prostate: Secondary | ICD-10-CM | POA: Insufficient documentation

## 2010-10-03 DIAGNOSIS — E785 Hyperlipidemia, unspecified: Secondary | ICD-10-CM | POA: Insufficient documentation

## 2010-10-03 DIAGNOSIS — Z5189 Encounter for other specified aftercare: Secondary | ICD-10-CM | POA: Insufficient documentation

## 2010-10-04 NOTE — Progress Notes (Signed)
Summary: Records Request  Faxed OV to [facility] at Cardiac Rehab ([phone]). Debby Freiberg  September 27, 2010 2:25 PM

## 2010-10-04 NOTE — Medication Information (Signed)
Summary: rov/ewj  Anticoagulant Therapy  Managed by: Cloyde Reams, RN, BSN Referring MD: Eden Emms PCP: Dr Tracey Harries Supervising MD: Daleen Squibb MD, Maisie Fus Indication 1: Atrial Fibrillation Lab Used: [facility] [place] Site: [address] INR POC 1.3 INR RANGE 2.0-3.0  Dietary changes: no    Health status changes: no    Bleeding/hemorrhagic complications: no    Recent/future hospitalizations: no    Any changes in medication regimen? no    Recent/future dental: no  Any missed doses?: no       Is patient compliant with meds? yes       Allergies: No Known Drug Allergies  Anticoagulation Management History:      The patient is taking warfarin and comes in today for a routine follow up visit.  Positive risk factors for bleeding include an age of 64 years or older.  The bleeding index is 'intermediate risk'.  Positive CHADS2 values include History of HTN and Age > 5 years old.  Anticoagulation responsible provider: Daleen Squibb MD, Maisie Fus.  INR POC: 1.3.  Cuvette Lot#: [id number].  Exp: 10/2011.    Anticoagulation Management Assessment/Plan:      The patient's current anticoagulation dose is Warfarin sodium 2.5 mg tabs: Use as directed by Anticoagualtion Clinic.  The target INR is 2.0-3.0.  The next INR is due 09/24/2010.  Anticoagulation instructions were given to patient.  Results were reviewed/authorized by Cloyde Reams, RN, BSN.  He was notified by Cloyde Reams RN.         Prior Anticoagulation Instructions: INR 1.3  Take an extra tablet today, then start taking 1 tablet daily except 1.5 tablets on Mondays and Fridays. Recheck in 1 week.   Current Anticoagulation Instructions: INR 1.3  Take an extra 1 tablet today, then start taking 1.5 tablets daily except 1 tablet on Tuesdays, Thursdays, and Saturdays.  Recheck in 1 week.   Prescriptions: WARFARIN SODIUM 2.5 MG TABS (WARFARIN SODIUM) Use as directed by Anticoagualtion Clinic  #50 x 3   Entered by:   Cloyde Reams  RN   Authorized by:   Colon Branch, MD, [facility]   Signed by:   Cloyde Reams RN on 09/17/2010   Method used:   Electronically to        [facility]* (retail)       [address] [place], [place]  [address]       Ph: [phone] or [phone]       Fax: [phone]   RxID:   [id number]

## 2010-10-04 NOTE — Medication Information (Signed)
Summary: ccr/  Anticoagulant Therapy  Managed by: Weston Brass, PharmD Referring MD: Eden Emms PCP: Dr Tracey Harries Supervising MD: Clifton James MD, Cristal Deer Indication 1: Atrial Fibrillation Lab Used: [facility] [place] Site: [address] INR POC 2.1 INR RANGE 2.0-3.0  Dietary changes: no    Health status changes: no    Bleeding/hemorrhagic complications: no    Recent/future hospitalizations: yes       Details: discharged on 12/27 after CABG x 4.  Post op afib- now on Coumadin  Any changes in medication regimen? yes       Details: on amiodarone  Recent/future dental: no  Any missed doses?: no       Is patient compliant with meds? yes       Allergies: No Known Drug Allergies  Anticoagulation Management History:      The patient is taking warfarin and comes in today for a routine follow up visit.  Positive risk factors for bleeding include an age of 75 years or older.  The bleeding index is 'intermediate risk'.  Positive CHADS2 values include History of HTN and Age > 67 years old.  Anticoagulation responsible provider: Clifton James MD, Cristal Deer.  INR POC: 2.1.  Cuvette Lot#: [id number].  Exp: 10/2011.    Anticoagulation Management Assessment/Plan:      The target INR is 2.0-3.0.  The next INR is due 09/07/2010.  Anticoagulation instructions were given to patient.  Results were reviewed/authorized by Weston Brass, PharmD.  He was notified by Weston Brass PharmD.         Current Anticoagulation Instructions: INR 2.1  Continue same dose of 1 tablet every day.  Recheck INR in 1 week.

## 2010-10-04 NOTE — Miscellaneous (Signed)
Summary: Physician Order/Treatment Plan  Physician Order/Treatment Plan   Imported By: Roderic Ovens 09/18/2010 13:28:45  _____________________________________________________________________  External Attachment:    Type:   Image     Comment:   External Document

## 2010-10-04 NOTE — Progress Notes (Signed)
Summary: dose Levi [name] need pre medication  Phone Note Call from Patient Call back at Home Phone [phone]   Caller: Patient Reason for Call: Talk to Nurse, Talk to Doctor Summary of Call: Levi [name] needs to have some dental work done and he had cabg and needs to know if he needs pre medication Initial call taken by: Omer Jack,  September 24, 2010 8:50 AM  Follow-up for Phone Call        Levi [name]. would like to know if he needs to have antibiotics prior dental work. Levi [name]. had CABGs in December 2011, and no history of valve disease. I let Levi [name] know he does not need pre-op antibiotics, but it will be better if he could wait at least three months after heart surgery to have an elective prosedure to give his body time to recover. Levi [name]. verbalized understanding. Follow-up by: Ollen Gross, RN, BSN,  September 24, 2010 9:05 AM

## 2010-10-04 NOTE — Letter (Signed)
Summary: TCT Office Visit  TCT Office Visit   Imported By: Marylou Mccoy 09/19/2010 14:06:54  _____________________________________________________________________  External Attachment:    Type:   Image     Comment:   External Document

## 2010-10-04 NOTE — Medication Information (Signed)
Summary: rov/sp  Anticoagulant Therapy  Managed by: Cloyde Reams, RN, BSN Referring MD: Eden Emms PCP: Dr Tracey Harries Supervising MD: Excell Seltzer MD, Casimiro Needle Indication 1: Atrial Fibrillation Lab Used: [facility] [place] Site: [address] INR POC 1.3 INR RANGE 2.0-3.0  Dietary changes: no    Health status changes: no    Bleeding/hemorrhagic complications: no    Recent/future hospitalizations: no    Any changes in medication regimen? no    Recent/future dental: no  Any missed doses?: no       Is patient compliant with meds? yes       Allergies: No Known Drug Allergies  Anticoagulation Management History:      The patient is taking warfarin and comes in today for a routine follow up visit.  Positive risk factors for bleeding include an age of 75 years or older.  The bleeding index is 'intermediate risk'.  Positive CHADS2 values include History of HTN and Age > 38 years old.  Anticoagulation responsible provider: Excell Seltzer MD, Casimiro Needle.  INR POC: 1.3.  Cuvette Lot#: [id number].  Exp: 10/2011.    Anticoagulation Management Assessment/Plan:      The target INR is 2.0-3.0.  The next INR is due 09/17/2010.  Anticoagulation instructions were given to patient.  Results were reviewed/authorized by Cloyde Reams, RN, BSN.  He was notified by Cloyde Reams RN.         Prior Anticoagulation Instructions: INR 2.1  Continue same dose of 1 tablet every day.  Recheck INR in 1 week.   Current Anticoagulation Instructions: INR 1.3  Take an extra tablet today, then start taking 1 tablet daily except 1.5 tablets on Mondays and Fridays. Recheck in 1 week.

## 2010-10-04 NOTE — Assessment & Plan Note (Signed)
Summary: eph/d/c [facility] 08/28/10   Primary Provider:  Dr Tracey Harries  CC:  sob.  History of Present Illness: Levi [name] is seen today post hospital D/C.  I reviewed his notes and cath films.  He had recurrent indigestin and dyspnea.  R/O and cath showed LM and 3vd with normal EF.  Post op course complicated by PAF D/C on amiodarone and coumadin.  INR subRx today.  Has not F/U with rehab.  LDL cholesterol in hospital only 71 and Dr Clarisse Gouge stopped his statin.  Maint NSR since D/C about a month ago and amiodarone stopped.  Told him we could stop coumadin today.  LE edema from venotomy will continue HCTZ/  No SSCP, wounds healed well.  No palpitaitons or TIA like symptoms  Left carotid bruit with no high grade disease on preop duplex.  Discussed exercise and diet restrictions.  Will refer to cardiac rehab at [facility].  F/U BMET in 8 weeks given diuretic  Current Problems (verified): 1)  Hypertension  (ICD-401.9) 2)  Hyperlipidemia  (ICD-272.4) 3)  Chest Pain Unspecified  (ICD-786.50) 4)  Painful Respiration  (ICD-786.52) 5)  Edema  (ICD-782.3) 6)  Adenomatous Colonic Polyp  (ICD-211.3) 7)  Dyspnea On Exertion  (ICD-786.09) 8)  Prostate Cancer  (ICD-185) 9)  Osteopenia  (ICD-733.90) 10)  Emphysema  (ICD-492.8) 11)  Bronchitis  (ICD-490) 12)  Colonic Polyps, Adenomatous, Hx of  (ICD-V12.72) 13)  Obesity  (ICD-278.00) 14)  Malaise and Fatigue  (ICD-780.79)  Current Medications (verified): 1)  Hydrochlorothiazide 25 Mg Tabs (Hydrochlorothiazide) .... Take One Tablet By Mouth Daily. 2)  Calcium  (Calcium Carbonate) .... 2 Tabs By Mouth Once Daily 3)  Metoprolol Tartrate 25 Mg Tabs (Metoprolol Tartrate) .... 1/2 Tab By Mouth Two Times A Day 4)  Aspirin 81 Mg Tbec (Aspirin) .... Take One Tablet By Mouth Daily  Allergies (verified): No Known Drug Allergies  Past History:  Past Medical History: Last updated: 12/27/2009 Current Problems:  HYPERTENSION (ICD-401.9) HYPERLIPIDEMIA (ICD-272.4) CHEST  PAIN UNSPECIFIED (ICD-786.50) PAINFUL RESPIRATION (ICD-786.52) EDEMA (ICD-782.3) ADENOMATOUS COLONIC POLYP (ICD-211.3) DYSPNEA ON EXERTION (ICD-786.09) PROSTATE CANCER (ICD-185) OSTEOPENIA (ICD-733.90) EMPHYSEMA (ICD-492.8) BRONCHITIS (ICD-490) COLONIC POLYPS, ADENOMATOUS, HX OF (ICD-V12.72) OBESITY (ICD-278.00) MALAISE AND FATIGUE (ICD-780.79)  Past Surgical History: Last updated: 09/19/2010  PROCEDURE:  Median sternotomy for coronary artery bypass grafting x4  (left internal mammary artery to second diagonal branch, saphenous vein  graft to distal left anterior descending coronary artery, saphenous vein  graft to second obtuse marginal branch of the left circumflex coronary  artery, saphenous vein graft to posterior descending coronary artery,  endoscopic saphenous vein harvest from right thigh and right lower leg)  with clipping of left atrial appendage. SURGEON:  Salvatore Decent. 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   Vital Signs:  Patient profile:   77 year old male Height:  70 inches Weight:      262 pounds BMI:     37.73 Pulse rate:   58 / minute Resp:     14 per minute BP sitting:   129 / 67  (left arm)  Vitals Entered By: Kem Parkinson (September 20, 2010 1:48 PM)  Physical Exam  General:   Affect appropriate Healthy:  appears stated age HEENT: normal Neck supple with no adenopathy JVP normal no bruits no thyromegaly Lungs clear with no wheezing and good diaphragmatic motion Heart:  S1/S2 no murmur,rub, gallop or click PMI normal Abdomen: benighn, BS positve, no tenderness, no AAA no bruit.  No HSM or HJR Distal pulses intact with no bruits Plus one bilateral  edema Neuro non-focal Skin warm and dry Sternotomy well healed    Impression & Recommendations:  Problem # 1:  CHEST PAIN UNSPECIFIED (ICD-786.50) S/P CABG 12/11 with normal LV.  Cardiac rehab. Continue ASA and BB The following medications were removed from the medication list:    Benazepril Hcl 40 Mg Tabs (Benazepril hcl) .Marland Kitchen... 1 tab by mouth once daily    Warfarin Sodium 2.5 Mg Tabs (Warfarin sodium) ..... Use as directed by anticoagualtion clinic His updated medication list for this problem includes:    Metoprolol Tartrate 25 Mg Tabs (Metoprolol tartrate) .Marland Kitchen... 1/2 tab by mouth two times a day    Aspirin 81 Mg Tbec (Aspirin) .Marland Kitchen... Take one tablet by mouth daily  Problem # 2:  HYPERLIPIDEMIA (ICD-272.4) LDL 71 diet Rx and weight loss no need for statin  Problem # 3:  HYPERTENSION (ICD-401.9) Well controlled ARB not started post op The following medications were removed from the medication list:    Benazepril Hcl 40 Mg Tabs (Benazepril hcl) .Marland Kitchen... 1 tab by mouth once daily His updated medication list for this problem includes:    Hydrochlorothiazide 25 Mg Tabs (Hydrochlorothiazide) .Marland Kitchen... Take one tablet by mouth daily.    Metoprolol Tartrate 25 Mg Tabs (Metoprolol tartrate) .Marland Kitchen... 1/2 tab by mouth two times a day    Aspirin 81 Mg Tbec (Aspirin) .Marland Kitchen... Take one tablet by mouth daily  Problem # 4:  EDEMA (ICD-782.3) Continue HCTZ  may need loop diuretic F/U 8 weeks with BMET  Problem # 5:  ATRIAL FIBRILLATION (ICD-427.31) Maint NSR stop coumadin remain on ASA The following medications were removed from  the medication list:    Warfarin Sodium 2.5 Mg Tabs (Warfarin sodium) ..... Use as directed by anticoagualtion clinic His updated medication list for this problem includes:    Metoprolol Tartrate 25 Mg Tabs (Metoprolol tartrate) .Marland Kitchen... 1/2 tab by mouth two times a day    Aspirin 81 Mg Tbec (Aspirin) .Marland Kitchen... Take one tablet by mouth daily  Problem # 6:  OTHER SYMPTOMS INVOLVING CARDIOVASCULAR SYSTEM (ICD-785.9) Left bruit F/U carotid duplex 6 months  Patient Instructions: 1)  Your physician has recommended you make the following change in your medication: STOP WARFARIN 2)  Your physician recommends referral and attendance at a Cardiac Rehab Program. 3)  Your physician recommends that you schedule a follow-up appointment in: 8 WEEKS

## 2010-10-04 NOTE — Medication Information (Signed)
Summary: Coumadin Clinic  Anticoagulant Therapy  Managed by: Inactive Referring MD: Eden Emms PCP: Dr Tracey Harries Supervising MD: Daleen Squibb MD, Maisie Fus Indication 1: Atrial Fibrillation Lab Used: [facility] [place] Site: [address] INR RANGE 2.0-3.0          Comments: coumdin Cx at Dr Eden Emms appt today. Now on ASA  Allergies: No Known Drug Allergies  Anticoagulation Management History:      Positive risk factors for bleeding include an age of 75 years or older.  The bleeding index is 'intermediate risk'.  Positive CHADS2 values include History of HTN and Age > 75 years old.  Anticoagulation responsible provider: Daleen Squibb MD, Maisie Fus.  Exp: 10/2011.    Anticoagulation Management Assessment/Plan:      The target INR is 2.0-3.0.  The next INR is due 09/24/2010.  Anticoagulation instructions were given to patient.  Results were reviewed/authorized by Inactive.         Prior Anticoagulation Instructions: INR 1.3  Take an extra 1 tablet today, then start taking 1.5 tablets daily except 1 tablet on Tuesdays, Thursdays, and Saturdays.  Recheck in 1 week.

## 2010-10-05 ENCOUNTER — Encounter (HOSPITAL_COMMUNITY): Payer: Medicare Other

## 2010-10-08 ENCOUNTER — Encounter (HOSPITAL_COMMUNITY): Payer: Medicare Other

## 2010-10-10 ENCOUNTER — Encounter (HOSPITAL_COMMUNITY): Payer: Medicare Other

## 2010-10-12 ENCOUNTER — Encounter: Payer: Self-pay | Admitting: Cardiovascular Disease

## 2010-10-12 ENCOUNTER — Encounter (HOSPITAL_COMMUNITY): Payer: Medicare Other

## 2010-10-15 ENCOUNTER — Encounter: Payer: Self-pay | Admitting: Cardiovascular Disease

## 2010-10-15 ENCOUNTER — Encounter (HOSPITAL_COMMUNITY): Payer: Medicare Other

## 2010-10-17 ENCOUNTER — Encounter (HOSPITAL_COMMUNITY): Payer: Medicare Other

## 2010-10-17 ENCOUNTER — Encounter: Payer: Self-pay | Admitting: Cardiovascular Disease

## 2010-10-18 NOTE — Miscellaneous (Signed)
Summary: [place] Physician Order/Treatment Plan   [facility] Physician Order/Treatment Plan   Imported By: Roderic Ovens 10/08/2010 14:18:46  _____________________________________________________________________  External Attachment:    Type:   Image     Comment:   External Document

## 2010-10-19 ENCOUNTER — Encounter (HOSPITAL_COMMUNITY): Payer: Medicare Other

## 2010-10-22 ENCOUNTER — Encounter (HOSPITAL_COMMUNITY): Payer: Medicare Other

## 2010-10-24 ENCOUNTER — Encounter (HOSPITAL_COMMUNITY): Payer: Medicare Other

## 2010-10-24 NOTE — Progress Notes (Signed)
Summary: [facility]: Office Visit  [facility]: Office Visit   Imported By: Earl Many 10/11/2010 18:19:58  _____________________________________________________________________  External Attachment:    Type:   Image     Comment:   External Document

## 2010-10-25 ENCOUNTER — Telehealth (INDEPENDENT_AMBULATORY_CARE_PROVIDER_SITE_OTHER): Payer: Self-pay | Admitting: *Deleted

## 2010-10-26 ENCOUNTER — Encounter (HOSPITAL_COMMUNITY): Payer: Medicare Other

## 2010-10-29 ENCOUNTER — Encounter (HOSPITAL_COMMUNITY): Payer: Medicare Other

## 2010-10-30 NOTE — Progress Notes (Signed)
Summary: pt rtn call  Phone Note Outgoing Call   Call placed by: Deliah Goody, RN,  October 25, 2010 11:17 AM Summary of Call: received paperwork from cardiac rehab regarding elevated bp at rest before exercise. dr Eden Emms reviewed and wants pt to start cozaar 50mg  daily and have a bmp checked in 4 weeks. Left message to call back Deliah Goody, RN  October 25, 2010 11:19 AM   Follow-up for Phone Call        pt rtn call Levi [name]  October 25, 2010 1:17 PM   spoke with pt, his bp is running fine at home, usually 120/60's. he is going to take his bp cuff to rehab tomorrow and have them check it for him. he will cont to track his bp at home and let me know of consistant elevation Deliah Goody, RN  October 25, 2010 4:32 PM\par

## 2010-10-31 ENCOUNTER — Encounter (HOSPITAL_COMMUNITY): Payer: Medicare Other

## 2010-11-02 ENCOUNTER — Encounter (HOSPITAL_COMMUNITY): Payer: Medicare Other

## 2010-11-05 ENCOUNTER — Encounter (HOSPITAL_COMMUNITY): Payer: Medicare Other

## 2010-11-07 ENCOUNTER — Encounter (HOSPITAL_COMMUNITY): Payer: Medicare Other

## 2010-11-08 NOTE — Miscellaneous (Signed)
Summary: [facility] Physician Response to Notification of Patient Event   [place] Physician Response to Notification of Patient Event   Imported By: Roderic Ovens 11/01/2010 09:49:48  _____________________________________________________________________  External Attachment:    Type:   Image     Comment:   External Document

## 2010-11-09 ENCOUNTER — Encounter (HOSPITAL_COMMUNITY): Payer: Medicare Other

## 2010-11-12 ENCOUNTER — Encounter (HOSPITAL_COMMUNITY): Payer: Medicare Other

## 2010-11-12 LAB — POCT I-STAT 3, ART BLOOD GAS (G3+)
Acid-base deficit: 4 mmol/L — ABNORMAL HIGH (ref 0.0–2.0)
Bicarbonate: 24.2 mEq/L — ABNORMAL HIGH (ref 20.0–24.0)
O2 Saturation: 96 %
O2 Saturation: 96 %
O2 Saturation: 97 %
Patient temperature: 35.7
Patient temperature: 37.7
TCO2: 22 mmol/L (ref 0–100)
TCO2: 24 mmol/L (ref 0–100)
TCO2: 25 mmol/L (ref 0–100)
pCO2 arterial: 38.4 mmHg (ref 35.0–45.0)
pCO2 arterial: 38.6 mmHg (ref 35.0–45.0)
pCO2 arterial: 40.6 mmHg (ref 35.0–45.0)
pCO2 arterial: 42.3 mmHg (ref 35.0–45.0)
pH, Arterial: 7.324 — ABNORMAL LOW (ref 7.350–7.450)
pH, Arterial: 7.429 (ref 7.350–7.450)
pO2, Arterial: 81 mmHg (ref 80.0–100.0)
pO2, Arterial: 91 mmHg (ref 80.0–100.0)

## 2010-11-12 LAB — LIPID PANEL
HDL: 30 mg/dL — ABNORMAL LOW (ref >39)
LDL Cholesterol: 71 mg/dL (ref 0–99)
Total CHOL/HDL Ratio: 5.1 RATIO
Triglycerides: 253 mg/dL — ABNORMAL HIGH (ref <150)
VLDL: 51 mg/dL — ABNORMAL HIGH (ref 0–40)

## 2010-11-12 LAB — CBC
HCT: 29.2 % — ABNORMAL LOW (ref 39.0–52.0)
HCT: 32.2 % — ABNORMAL LOW (ref 39.0–52.0)
HCT: 36.3 % — ABNORMAL LOW (ref 39.0–52.0)
HCT: 40.5 % (ref 39.0–52.0)
Hemoglobin: 10.4 g/dL — ABNORMAL LOW (ref 13.0–17.0)
Hemoglobin: 12.5 g/dL — ABNORMAL LOW (ref 13.0–17.0)
Hemoglobin: 13.6 g/dL (ref 13.0–17.0)
Hemoglobin: 13.8 g/dL (ref 13.0–17.0)
Hemoglobin: 16.3 g/dL (ref 13.0–17.0)
Hemoglobin: 9.7 g/dL — ABNORMAL LOW (ref 13.0–17.0)
Hemoglobin: 9.7 g/dL — ABNORMAL LOW (ref 13.0–17.0)
MCH: 29.9 pg (ref 26.0–34.0)
MCH: 30 pg (ref 26.0–34.0)
MCH: 30 pg (ref 26.0–34.0)
MCH: 30.2 pg (ref 26.0–34.0)
MCH: 30.3 pg (ref 26.0–34.0)
MCH: 30.8 pg (ref 26.0–34.0)
MCH: 30.9 pg (ref 26.0–34.0)
MCHC: 32.8 g/dL (ref 30.0–36.0)
MCHC: 33.2 g/dL (ref 30.0–36.0)
MCHC: 33.3 g/dL (ref 30.0–36.0)
MCV: 89.3 fL (ref 78.0–100.0)
MCV: 89.5 fL (ref 78.0–100.0)
MCV: 90.2 fL (ref 78.0–100.0)
MCV: 90.2 fL (ref 78.0–100.0)
MCV: 90.7 fL (ref 78.0–100.0)
Platelets: 160 10*3/uL (ref 150–400)
Platelets: 165 10*3/uL (ref 150–400)
Platelets: 180 10*3/uL (ref 150–400)
Platelets: 197 10*3/uL (ref 150–400)
Platelets: 232 10*3/uL (ref 150–400)
RBC: 3.24 MIL/uL — ABNORMAL LOW (ref 4.22–5.81)
RBC: 3.27 MIL/uL — ABNORMAL LOW (ref 4.22–5.81)
RBC: 3.44 MIL/uL — ABNORMAL LOW (ref 4.22–5.81)
RBC: 3.77 MIL/uL — ABNORMAL LOW (ref 4.22–5.81)
RBC: 4.48 MIL/uL (ref 4.22–5.81)
RBC: 4.49 MIL/uL (ref 4.22–5.81)
RDW: 12.9 % (ref 11.5–15.5)
RDW: 13.3 % (ref 11.5–15.5)
RDW: 13.4 % (ref 11.5–15.5)
WBC: 15.2 10*3/uL — ABNORMAL HIGH (ref 4.0–10.5)
WBC: 15.9 10*3/uL — ABNORMAL HIGH (ref 4.0–10.5)
WBC: 22.2 10*3/uL — ABNORMAL HIGH (ref 4.0–10.5)
WBC: 9.3 10*3/uL (ref 4.0–10.5)
WBC: 9.4 10*3/uL (ref 4.0–10.5)

## 2010-11-12 LAB — BASIC METABOLIC PANEL
BUN: 17 mg/dL (ref 6–23)
BUN: 22 mg/dL (ref 6–23)
BUN: 30 mg/dL — ABNORMAL HIGH (ref 6–23)
BUN: 33 mg/dL — ABNORMAL HIGH (ref 6–23)
CO2: 20 mEq/L (ref 19–32)
CO2: 27 mEq/L (ref 19–32)
CO2: 29 mEq/L (ref 19–32)
CO2: 29 mEq/L (ref 19–32)
Calcium: 10.1 mg/dL (ref 8.4–10.5)
Calcium: 8.3 mg/dL — ABNORMAL LOW (ref 8.4–10.5)
Calcium: 8.3 mg/dL — ABNORMAL LOW (ref 8.4–10.5)
Chloride: 100 mEq/L (ref 96–112)
Chloride: 101 mEq/L (ref 96–112)
Chloride: 102 mEq/L (ref 96–112)
Chloride: 105 mEq/L (ref 96–112)
Chloride: 107 mEq/L (ref 96–112)
Creatinine, Ser: 1.3 mg/dL (ref 0.4–1.5)
Creatinine, Ser: 1.38 mg/dL (ref 0.4–1.5)
Creatinine, Ser: 1.47 mg/dL (ref 0.4–1.5)
Creatinine, Ser: 1.5 mg/dL (ref 0.4–1.5)
GFR calc Af Amer: 53 mL/min — ABNORMAL LOW (ref >60)
GFR calc Af Amer: 55 mL/min — ABNORMAL LOW (ref >60)
GFR calc Af Amer: 56 mL/min — ABNORMAL LOW (ref >60)
GFR calc Af Amer: >60 mL/min (ref >60)
GFR calc non Af Amer: 45 mL/min — ABNORMAL LOW (ref >60)
GFR calc non Af Amer: 46 mL/min — ABNORMAL LOW (ref >60)
GFR calc non Af Amer: 51 mL/min — ABNORMAL LOW (ref >60)
Glucose, Bld: 106 mg/dL — ABNORMAL HIGH (ref 70–99)
Glucose, Bld: 136 mg/dL — ABNORMAL HIGH (ref 70–99)
Glucose, Bld: 177 mg/dL — ABNORMAL HIGH (ref 70–99)
Potassium: 4.3 mEq/L (ref 3.5–5.1)
Potassium: 4.3 mEq/L (ref 3.5–5.1)
Potassium: 4.3 mEq/L (ref 3.5–5.1)
Sodium: 132 mEq/L — ABNORMAL LOW (ref 135–145)
Sodium: 133 mEq/L — ABNORMAL LOW (ref 135–145)
Sodium: 138 mEq/L (ref 135–145)
Sodium: 139 mEq/L (ref 135–145)

## 2010-11-12 LAB — POCT I-STAT 4, (NA,K, GLUC, HGB,HCT)
Glucose, Bld: 102 mg/dL — ABNORMAL HIGH (ref 70–99)
Glucose, Bld: 108 mg/dL — ABNORMAL HIGH (ref 70–99)
HCT: 35 % — ABNORMAL LOW (ref 39.0–52.0)
HCT: 36 % — ABNORMAL LOW (ref 39.0–52.0)
Hemoglobin: 10.2 g/dL — ABNORMAL LOW (ref 13.0–17.0)
Hemoglobin: 14.6 g/dL (ref 13.0–17.0)
Hemoglobin: 9.2 g/dL — ABNORMAL LOW (ref 13.0–17.0)
Potassium: 3.7 mEq/L (ref 3.5–5.1)
Potassium: 4.2 mEq/L (ref 3.5–5.1)
Potassium: 4.9 mEq/L (ref 3.5–5.1)
Sodium: 133 mEq/L — ABNORMAL LOW (ref 135–145)
Sodium: 136 mEq/L (ref 135–145)
Sodium: 140 mEq/L (ref 135–145)

## 2010-11-12 LAB — PROTIME-INR
INR: 0.95 (ref 0.00–1.49)
INR: 1.2 (ref 0.00–1.49)
INR: 1.22 (ref 0.00–1.49)
INR: 1.24 (ref 0.00–1.49)
INR: 1.54 — ABNORMAL HIGH (ref 0.00–1.49)
Prothrombin Time: 13.3 seconds (ref 11.6–15.2)
Prothrombin Time: 15.6 seconds — ABNORMAL HIGH (ref 11.6–15.2)
Prothrombin Time: 18.7 seconds — ABNORMAL HIGH (ref 11.6–15.2)

## 2010-11-12 LAB — GLUCOSE, CAPILLARY
Glucose-Capillary: 100 mg/dL — ABNORMAL HIGH (ref 70–99)
Glucose-Capillary: 106 mg/dL — ABNORMAL HIGH (ref 70–99)
Glucose-Capillary: 107 mg/dL — ABNORMAL HIGH (ref 70–99)
Glucose-Capillary: 116 mg/dL — ABNORMAL HIGH (ref 70–99)
Glucose-Capillary: 116 mg/dL — ABNORMAL HIGH (ref 70–99)
Glucose-Capillary: 116 mg/dL — ABNORMAL HIGH (ref 70–99)
Glucose-Capillary: 119 mg/dL — ABNORMAL HIGH (ref 70–99)
Glucose-Capillary: 122 mg/dL — ABNORMAL HIGH (ref 70–99)
Glucose-Capillary: 125 mg/dL — ABNORMAL HIGH (ref 70–99)
Glucose-Capillary: 137 mg/dL — ABNORMAL HIGH (ref 70–99)
Glucose-Capillary: 141 mg/dL — ABNORMAL HIGH (ref 70–99)
Glucose-Capillary: 147 mg/dL — ABNORMAL HIGH (ref 70–99)
Glucose-Capillary: 176 mg/dL — ABNORMAL HIGH (ref 70–99)
Glucose-Capillary: 75 mg/dL (ref 70–99)
Glucose-Capillary: 85 mg/dL (ref 70–99)

## 2010-11-12 LAB — APTT: aPTT: 39 seconds — ABNORMAL HIGH (ref 24–37)

## 2010-11-12 LAB — CARDIAC PANEL(CRET KIN+CKTOT+MB+TROPI)
CK, MB: 2.6 ng/mL (ref 0.3–4.0)
CK, MB: 2.8 ng/mL (ref 0.3–4.0)
Relative Index: INVALID (ref 0.0–2.5)
Relative Index: INVALID (ref 0.0–2.5)
Total CK: 48 U/L (ref 7–232)
Total CK: 54 U/L (ref 7–232)
Troponin I: 0.03 ng/mL (ref 0.00–0.06)

## 2010-11-12 LAB — MAGNESIUM
Magnesium: 2.1 mg/dL (ref 1.5–2.5)
Magnesium: 2.9 mg/dL — ABNORMAL HIGH (ref 1.5–2.5)

## 2010-11-12 LAB — BLOOD GAS, ARTERIAL
Bicarbonate: 24.8 mEq/L — ABNORMAL HIGH (ref 20.0–24.0)
TCO2: 25.9 mmol/L (ref 0–100)
pCO2 arterial: 35.9 mmHg (ref 35.0–45.0)
pH, Arterial: 7.453 — ABNORMAL HIGH (ref 7.350–7.450)
pO2, Arterial: 78.7 mmHg — ABNORMAL LOW (ref 80.0–100.0)

## 2010-11-12 LAB — CROSSMATCH
Antibody Screen: NEGATIVE
Unit division: 0

## 2010-11-12 LAB — URINALYSIS, ROUTINE W REFLEX MICROSCOPIC
Bilirubin Urine: NEGATIVE
Glucose, UA: NEGATIVE mg/dL
Hgb urine dipstick: NEGATIVE
Ketones, ur: NEGATIVE mg/dL
Nitrite: NEGATIVE
Protein, ur: NEGATIVE mg/dL
Specific Gravity, Urine: 1.018 (ref 1.005–1.030)
Urobilinogen, UA: 1 mg/dL (ref 0.0–1.0)
pH: 5.5 (ref 5.0–8.0)

## 2010-11-12 LAB — HEPARIN LEVEL (UNFRACTIONATED)
Heparin Unfractionated: 0.14 IU/mL — ABNORMAL LOW (ref 0.30–0.70)
Heparin Unfractionated: 0.19 IU/mL — ABNORMAL LOW (ref 0.30–0.70)

## 2010-11-12 LAB — COMPREHENSIVE METABOLIC PANEL
AST: 19 U/L (ref 0–37)
Alkaline Phosphatase: 70 U/L (ref 39–117)
BUN: 22 mg/dL (ref 6–23)
CO2: 30 mEq/L (ref 19–32)
GFR calc Af Amer: >60 mL/min (ref >60)
GFR calc non Af Amer: 55 mL/min — ABNORMAL LOW (ref >60)
Sodium: 139 mEq/L (ref 135–145)

## 2010-11-12 LAB — URINE CULTURE

## 2010-11-12 LAB — POCT I-STAT 3, VENOUS BLOOD GAS (G3P V)
Acid-base deficit: 1 mmol/L (ref 0.0–2.0)
pH, Ven: 7.336 — ABNORMAL HIGH (ref 7.250–7.300)

## 2010-11-12 LAB — CREATININE, SERUM
Creatinine, Ser: 1.07 mg/dL (ref 0.4–1.5)
GFR calc Af Amer: >60 mL/min (ref >60)

## 2010-11-12 LAB — URINE MICROSCOPIC-ADD ON

## 2010-11-12 LAB — HEMOGLOBIN A1C: Mean Plasma Glucose: 117 mg/dL — ABNORMAL HIGH (ref <117)

## 2010-11-12 LAB — POCT CARDIAC MARKERS
CKMB, poc: 1.7 ng/mL (ref 1.0–8.0)
Myoglobin, poc: 143 ng/mL (ref 12–200)

## 2010-11-12 LAB — CK TOTAL AND CKMB (NOT AT ARMC): CK, MB: 2.7 ng/mL (ref 0.3–4.0)

## 2010-11-12 LAB — HEMOGLOBIN AND HEMATOCRIT, BLOOD: Hemoglobin: 10.5 g/dL — ABNORMAL LOW (ref 13.0–17.0)

## 2010-11-12 LAB — POCT I-STAT, CHEM 8
BUN: 15 mg/dL (ref 6–23)
Calcium, Ion: 1.13 mmol/L (ref 1.12–1.32)
Creatinine, Ser: 1 mg/dL (ref 0.4–1.5)
Hemoglobin: 11.2 g/dL — ABNORMAL LOW (ref 13.0–17.0)
TCO2: 19 mmol/L (ref 0–100)

## 2010-11-12 LAB — MRSA PCR SCREENING: MRSA by PCR: NEGATIVE

## 2010-11-13 NOTE — Miscellaneous (Signed)
Summary: [place] [facility] Progress Report   [place] [facility] Progress Report   Imported By: Roderic Ovens 11/06/2010 15:35:43  _____________________________________________________________________  External Attachment:    Type:   Image     Comment:   External Document

## 2010-11-13 NOTE — Letter (Signed)
Summary: [facility]  [facility]   Imported By: Marylou Mccoy 11/07/2010 12:00:28  _____________________________________________________________________  External Attachment:    Type:   Image     Comment:   External Document

## 2010-11-14 ENCOUNTER — Encounter: Payer: Self-pay | Admitting: Cardiovascular Disease

## 2010-11-14 ENCOUNTER — Encounter (HOSPITAL_COMMUNITY): Payer: Medicare Other

## 2010-11-16 ENCOUNTER — Encounter (HOSPITAL_COMMUNITY): Payer: Medicare Other

## 2010-11-19 ENCOUNTER — Encounter (HOSPITAL_COMMUNITY): Payer: Medicare Other

## 2010-11-20 NOTE — Miscellaneous (Signed)
Summary: [place] [facility] Progress Report   [place] Progress Report   Imported By: Roderic Ovens 11/12/2010 13:53:29  _____________________________________________________________________  External Attachment:    Type:   Image     Comment:   External Document

## 2010-11-21 ENCOUNTER — Encounter (HOSPITAL_COMMUNITY): Payer: Medicare Other | Attending: Cardiovascular Disease

## 2010-11-21 DIAGNOSIS — E119 Type 2 diabetes mellitus without complications: Secondary | ICD-10-CM | POA: Insufficient documentation

## 2010-11-21 DIAGNOSIS — Z5189 Encounter for other specified aftercare: Secondary | ICD-10-CM | POA: Insufficient documentation

## 2010-11-21 DIAGNOSIS — Z7901 Long term (current) use of anticoagulants: Secondary | ICD-10-CM | POA: Insufficient documentation

## 2010-11-21 DIAGNOSIS — I2510 Atherosclerotic heart disease of native coronary artery without angina pectoris: Secondary | ICD-10-CM | POA: Insufficient documentation

## 2010-11-21 DIAGNOSIS — I4891 Unspecified atrial fibrillation: Secondary | ICD-10-CM | POA: Insufficient documentation

## 2010-11-21 DIAGNOSIS — Z87891 Personal history of nicotine dependence: Secondary | ICD-10-CM | POA: Insufficient documentation

## 2010-11-21 DIAGNOSIS — E785 Hyperlipidemia, unspecified: Secondary | ICD-10-CM | POA: Insufficient documentation

## 2010-11-21 DIAGNOSIS — I10 Essential (primary) hypertension: Secondary | ICD-10-CM | POA: Insufficient documentation

## 2010-11-21 DIAGNOSIS — Z7982 Long term (current) use of aspirin: Secondary | ICD-10-CM | POA: Insufficient documentation

## 2010-11-21 DIAGNOSIS — C61 Malignant neoplasm of prostate: Secondary | ICD-10-CM | POA: Insufficient documentation

## 2010-11-21 DIAGNOSIS — E669 Obesity, unspecified: Secondary | ICD-10-CM | POA: Insufficient documentation

## 2010-11-21 DIAGNOSIS — I200 Unstable angina: Secondary | ICD-10-CM | POA: Insufficient documentation

## 2010-11-21 DIAGNOSIS — Z951 Presence of aortocoronary bypass graft: Secondary | ICD-10-CM | POA: Insufficient documentation

## 2010-11-23 ENCOUNTER — Encounter (HOSPITAL_COMMUNITY): Payer: Medicare Other

## 2010-11-26 ENCOUNTER — Encounter (HOSPITAL_COMMUNITY): Payer: Medicare Other

## 2010-11-28 ENCOUNTER — Encounter: Payer: Self-pay | Admitting: Cardiovascular Disease

## 2010-11-28 ENCOUNTER — Ambulatory Visit (INDEPENDENT_AMBULATORY_CARE_PROVIDER_SITE_OTHER): Payer: Medicare Other | Admitting: Cardiovascular Disease

## 2010-11-28 ENCOUNTER — Encounter (HOSPITAL_COMMUNITY): Payer: Medicare Other

## 2010-11-28 DIAGNOSIS — E785 Hyperlipidemia, unspecified: Secondary | ICD-10-CM

## 2010-11-28 DIAGNOSIS — R079 Chest pain, unspecified: Secondary | ICD-10-CM

## 2010-11-28 DIAGNOSIS — I4891 Unspecified atrial fibrillation: Secondary | ICD-10-CM

## 2010-11-28 NOTE — Assessment & Plan Note (Signed)
Maint NSR Cotinue ASA No indication for coumadin

## 2010-11-28 NOTE — Progress Notes (Signed)
Levi [name] is seen today post hospital D/C  CABG 08/22/10 .  I reviewed his notes and cath films.  He had recurrent indigestin and dyspnea.  R/O and cath showed LM and 3vd with normal EF.  Post op course complicated by PAF D/C on amiodarone and coumadin.  INR subRx today.  Has not F/U with rehab.  LDL cholesterol in hospital only 71 and Dr Clarisse Gouge stopped his statin.  Maint NSR since D/C about a month ago and amiodarone stopped.  Told him we could stop coumadin today.  LE edema from venotomy will continue HCTZ/  No SSCP, wounds healed well.  No palpitaitons or TIA like symptoms  Left carotid bruit with no high grade disease on preop duplex.  Discussed exercise and diet restrictions.  Does not want to go back to cardiac rehab.  Recent cough Rx by Dr Everlene Other.  No fever or sputum  ROS: Denies fever, malais, weight loss, blurry vision, decreased visual acuity, cough, sputum, SOB, hemoptysis, pleuritic pain, palpitaitons, heartburn, abdominal pain, melena, lower extremity edema, claudication, or rash.   General: Affect appropriate Healthy:  appears stated age HEENT: normal Neck supple with no adenopathy JVP normal left  bruits no thyromegaly Lungs clear with no wheezing and good diaphragmatic motion Heart:  S1/S2 no murmur,rub, gallop or click PMI normal Abdomen: benighn, BS positve, no tenderness, no AAA no bruit.  No HSM or HJR Distal pulses intact with no bruits No edema Neuro non-focal Skin warm and dry No muscular weakness   Current Outpatient Prescriptions  Medication Sig Dispense Refill  . aspirin 81 MG tablet Take 81 mg by mouth daily.        . calcium carbonate 200 MG capsule Take 250 mg by mouth 2 (two) times daily with a meal.        . hydrochlorothiazide 25 MG tablet Take 25 mg by mouth daily.        . metoprolol tartrate (LOPRESSOR) 25 MG tablet Take 12.5 mg by mouth 2 (two) times daily.          Allergies  NKDA  Electrocardiogram:  NSR 59  RBBB LAFB LVH  Assessment and  Plan

## 2010-11-28 NOTE — Assessment & Plan Note (Signed)
S/P CABG 12/11  Dr Cornelius Moras,    Median sternotomy for coronary artery bypass grafting x4 (left internal mammary artery to second diagonal branch, saphenous vein graft to distal left anterior descending coronary artery, saphenous vein graft to second obtuse marginal branch of the left circumflex coronary artery, saphenous vein graft to posterior descending coronary artery, endoscopic saphenous vein harvest from right thigh and right lower leg) with clipping of left atrial appendage.  Stable no angina

## 2010-11-28 NOTE — Patient Instructions (Signed)
Your physician recommends that you schedule a follow-up appointment in: 6 months with Dr. Nishan 

## 2010-11-28 NOTE — Assessment & Plan Note (Signed)
Lab Results  Component Value Date   LDLCALC  Value: 70        Total Cholesterol/HDL:CHD Risk Coronary Heart Disease Risk Table                     Men   Women  1/2 Average Risk   3.4   3.3  Average Risk       5.0   4.4  2 X Average Risk   9.6   7.1  3 X Average Risk  23.4   11.0        Use the calculated Patient Ratio above and the CHD Risk Table to determine the patient's CHD Risk.        ATP III CLASSIFICATION (LDL):  <100     mg/dL   Optimal  562-130  mg/dL   Near or Above                    Optimal  130-159  mg/dL   Borderline  865-784  mg/dL   High  >696     mg/dL   Very High 29/52/8413  LDL less than 100 At goal

## 2010-11-29 ENCOUNTER — Encounter: Payer: Self-pay | Admitting: Cardiovascular Disease

## 2010-11-30 ENCOUNTER — Encounter (HOSPITAL_COMMUNITY): Payer: Medicare Other

## 2010-12-03 ENCOUNTER — Encounter (HOSPITAL_COMMUNITY): Payer: Medicare Other

## 2010-12-04 NOTE — Progress Notes (Signed)
Addended by: Kem Parkinson on: 12/04/2010 04:54 PM   Modules accepted: Orders

## 2010-12-05 ENCOUNTER — Encounter (HOSPITAL_COMMUNITY): Payer: Medicare Other

## 2010-12-07 ENCOUNTER — Encounter (HOSPITAL_COMMUNITY): Payer: Medicare Other

## 2010-12-10 ENCOUNTER — Encounter (HOSPITAL_COMMUNITY): Payer: Medicare Other

## 2010-12-10 ENCOUNTER — Telehealth: Payer: Self-pay | Admitting: Cardiovascular Disease

## 2010-12-10 MED ORDER — FUROSEMIDE 20 MG PO TABS: 20.0000 mg | ORAL_TABLET | Freq: Every day | ORAL | Status: DC

## 2010-12-10 NOTE — Telephone Encounter (Signed)
Per Dr Shirlee Latch stop hctz and start lasix 20mg  daily, check bmet and bnp in 1 week, attempted to call pt but line busy will try again later

## 2010-12-10 NOTE — Telephone Encounter (Signed)
Spoke w/pts wife, she states since Fri pt has been having trouble w/LE edema and increased SOB, no chest pain.  She states his legs are better in the AM but swell during the day and are sore and painful.  She states he is much more SOB than usual for him.  He is on HCTZ 25mg  daily and has been taking it.  She reports no increase in salt or fluid intake, will discuss w/DOD and call her back.

## 2010-12-10 NOTE — Telephone Encounter (Signed)
Pt's wife aware, new rx sent in, pt will come for labs Mon 4/16, also discussed keeping legs elevated when possible and watching salt and fluid intack

## 2010-12-12 ENCOUNTER — Encounter (HOSPITAL_COMMUNITY): Payer: Medicare Other

## 2010-12-14 ENCOUNTER — Encounter (HOSPITAL_COMMUNITY): Payer: Medicare Other

## 2010-12-17 ENCOUNTER — Other Ambulatory Visit (INDEPENDENT_AMBULATORY_CARE_PROVIDER_SITE_OTHER): Payer: Medicare Other | Admitting: *Deleted

## 2010-12-17 ENCOUNTER — Encounter (HOSPITAL_COMMUNITY): Payer: Medicare Other

## 2010-12-17 DIAGNOSIS — R609 Edema, unspecified: Secondary | ICD-10-CM

## 2010-12-17 DIAGNOSIS — R0989 Other specified symptoms and signs involving the circulatory and respiratory systems: Secondary | ICD-10-CM

## 2010-12-17 LAB — BRAIN NATRIURETIC PEPTIDE: Pro B Natriuretic peptide (BNP): 167.6 pg/mL — ABNORMAL HIGH (ref 0.0–100.0)

## 2010-12-17 LAB — BASIC METABOLIC PANEL WITH GFR
BUN: 15 mg/dL (ref 6–23)
CO2: 29 meq/L (ref 19–32)
Calcium: 9.1 mg/dL (ref 8.4–10.5)
Chloride: 103 meq/L (ref 96–112)
Creatinine, Ser: 1 mg/dL (ref 0.4–1.5)
GFR: 73.07 mL/min
Glucose, Bld: 78 mg/dL (ref 70–99)
Potassium: 4.8 meq/L (ref 3.5–5.1)
Sodium: 139 meq/L (ref 135–145)

## 2010-12-19 ENCOUNTER — Encounter (HOSPITAL_COMMUNITY): Payer: Medicare Other

## 2010-12-21 ENCOUNTER — Encounter (HOSPITAL_COMMUNITY): Payer: Medicare Other

## 2010-12-24 ENCOUNTER — Encounter (HOSPITAL_COMMUNITY): Payer: Medicare Other

## 2010-12-26 ENCOUNTER — Encounter (HOSPITAL_COMMUNITY): Payer: Medicare Other

## 2010-12-28 ENCOUNTER — Encounter (HOSPITAL_COMMUNITY): Payer: Medicare Other

## 2010-12-31 ENCOUNTER — Encounter (HOSPITAL_COMMUNITY): Payer: Medicare Other

## 2011-01-02 ENCOUNTER — Encounter (HOSPITAL_COMMUNITY): Payer: Medicare Other

## 2011-01-04 ENCOUNTER — Encounter (HOSPITAL_COMMUNITY): Payer: Medicare Other

## 2011-01-07 ENCOUNTER — Encounter (HOSPITAL_COMMUNITY): Payer: Medicare Other

## 2011-01-09 ENCOUNTER — Encounter (HOSPITAL_COMMUNITY): Payer: Medicare Other

## 2011-01-11 ENCOUNTER — Encounter (HOSPITAL_COMMUNITY): Payer: Medicare Other

## 2011-01-14 ENCOUNTER — Other Ambulatory Visit: Payer: Self-pay | Admitting: *Deleted

## 2011-01-14 ENCOUNTER — Encounter (HOSPITAL_COMMUNITY): Payer: Medicare Other

## 2011-01-14 MED ORDER — METOPROLOL TARTRATE 25 MG PO TABS: ORAL_TABLET | ORAL | Status: DC

## 2011-01-14 MED ORDER — FUROSEMIDE 20 MG PO TABS: 20.0000 mg | ORAL_TABLET | Freq: Every day | ORAL | Status: DC

## 2011-01-15 NOTE — Assessment & Plan Note (Signed)
OFFICE VISIT   HITESH, FOUCHE  DOB:  1928/12/02                                        September 10, 2010  CHART #:  [id number]   HISTORY OF PRESENT ILLNESS:  The patient returns for routine follow up  status post coronary artery bypass grafting x4 with clipping of left  atrial appendage on August 22, 2010.  The patient's postoperative  recovery has been uncomplicated.  Since hospital discharge, he has  continued to do quite nicely and he returns to our office for routine  follow up today.  Coumadin dose has been monitored and adjusted through  the [facility].  Early after surgery, he had problems with  nausea and dizziness.  This resolved after amiodarone and Crestor were  both discontinued.  Over the last few weeks, he has done very nicely.  He denies any fevers, chills, or productive cough.  He states he does  have some exertional shortness of breath and a dry nonproductive cough.  Overall, he states that he feels much better than he did prior to  surgery.  He has no significant soreness at all in his chest.  His  appetite is good.  He is not sleeping very well, but he states that he  just simply is not sleeping.  He is not having problems with pain or  shortness of breath at night.  He has not yet started the cardiac rehab  program.  The remainder of his review of systems are unremarkable.   PHYSICAL EXAMINATION:  Notable for well-appearing male with blood  pressure 116/75, pulse 58 and regular, oxygen saturation 96% on room  air.  Examination of the chest demonstrates a median sternotomy scar  that is healing nicely.  The sternum is stable on palpation.  Breath  sounds are clear to auscultation and symmetrical bilaterally.  Cardiovascular exam includes regular rate and rhythm.  No murmurs, rubs,  or gallops are noted.  The abdomen is soft, nontender.  The extremities  are warm and well perfused.  The small incisions from endoscopic vein  harvest is healing well, although there is some dry eschar over the  incision.  There is no cellulitis nor other sign of infection at all.  The remainder of his physical exam is unremarkable.   DIAGNOSTIC TESTS:  Chest x-ray performed today at the [facility] is reviewed.  This demonstrates clear lung fields bilaterally.  All the sternal wires appear intact.  There are no significant pleural  effusions at all.  No other abnormalities are noted.   IMPRESSION:  Excellent progress following recent coronary artery bypass  grafting with clipping of left atrial appendage.  The patient had one  brief episode of atrial fibrillation postoperatively, but for the most  part has remained in sinus rhythm.  He is doing well at this time.  I do  think he would benefit from getting involved in the cardiac rehab  program.   PLAN:  I have encouraged the patient to continue to gradually increase  his physical activity as tolerated with his only significant limitation  at this point remaining that he refrain from heavy lifting or strenuous  use of his arms or shoulders for least another 2 months.  I have  encouraged him to get involved in the cardiac rehab program.  We have  not made any changes to his current medications.  All of his questions  have been addressed.  In the future, he will call and return to see [place]  as needed.  He is scheduled to see Dr. Eden Emms for further follow up next  week.   Salvatore Decent. Cornelius Moras, M.D.  Electronically Signed   CHO/MEDQ  D:  09/10/2010  T:  09/11/2010  Job:  [id number]   cc:   Everardo Beals. Juanda Chance, MD, [facility]  Peter C. Eden Emms, MD, [facility]  Tracey Harries, M.D.

## 2011-01-16 ENCOUNTER — Encounter (HOSPITAL_COMMUNITY): Payer: Medicare Other

## 2011-01-17 ENCOUNTER — Telehealth: Payer: Self-pay | Admitting: Cardiovascular Disease

## 2011-01-17 MED ORDER — FUROSEMIDE 20 MG PO TABS: 20.0000 mg | ORAL_TABLET | Freq: Every day | ORAL | Status: DC

## 2011-01-17 MED ORDER — METOPROLOL TARTRATE 25 MG PO TABS: ORAL_TABLET | ORAL | Status: DC

## 2011-01-17 NOTE — Telephone Encounter (Signed)
Rx sent into pharmacy. Pt wife notified.

## 2011-01-17 NOTE — Telephone Encounter (Signed)
Pt needs three months supply on his meds to be fax or called in to prescription solution # [phone]

## 2011-01-18 ENCOUNTER — Encounter (HOSPITAL_COMMUNITY): Payer: Medicare Other

## 2011-01-18 NOTE — Op Note (Signed)
NAME:  Levi [name], Levi [name]                         ACCOUNT NO.:  [id number]   MEDICAL RECORD NO.:  [id number]                   PATIENT TYPE:  AMB   LOCATION:  DAY                                  FACILITY:  [facility]   PHYSICIAN:  Melvyn Novas, M.D.                DATE OF BIRTH:  15-Jun-1929   DATE OF PROCEDURE:  08/09/2002  DATE OF DISCHARGE:                                 OPERATIVE REPORT   PREOPERATIVE DIAGNOSES:  Bladder outlet obstruction and midline prostatic  hypertrophy.   POSTOPERATIVE DIAGNOSES:  Bladder outlet obstruction and midline prostatic  hypertrophy and bladder stones.   PROCEDURE:  1. Evacuation of bladder stones.  2. Transurethral resection of the prostate.   ANESTHESIA:  General.   SURGEON:  Sigmund I. Patsi Sears, M.D.]   ASSISTANT:  Melvyn Novas, M.D.   ESTIMATED BLOOD LOSS:  300 cc   DRAINS:  Foley catheter continuous bladder irrigation.   COMPLICATIONS:  None.   SPECIMENS:  TUR chips.   BRIEF HISTORY:  [name] is a 77 year old white male with a history of BPH  who has not responded to oral therapy for bladder outlet obstructive  symptoms.   DESCRIPTION OF PROCEDURE:  Following the administration of antibiotics and  anesthesia, Mr. Achille was prepped and draped in the dorsal lithotomy  position. A standard cystoscopy was performed using a 12 degree lens. The  patient was noted to have multiple bladder stones. These were removed from  the bladder through irrigation. The cystoscope was then removed. The urethra  was systematically dilated to 28 [place] using [employer]. A 28 French  resectoscope sheath was then placed using an obturator. The resectoscope was  then placed. The prostate contained a very large median lobe as well as  large lateral lobes. The bladder neck was quite high. We therefore placed a  Collins knife and excised the lateral aspects of the middle lobe from the  bladder neck to the verumontanum. We then replaced the  [facility] with  the resectoscope loop and proceeded to take down the middle lobe from the  bladder neck to the verumontanum down to the circular fibers of the surgical  capsule. The patient was noted to have a very large prostate. Following  taking down the middle lobe, we turned our attention to the patient's left  lateral lobe which was taken down from the 1 o'clock to the 6 o'clock  position systematically from the bladder neck to the verumontanum. This was  repeated on the opposite side. We then took down the hanging tissue at the  anterior prostate systematically again from the bladder neck to the  verumontanum. We evacuated several more bladder stones. Following resection  which was considerable, we irrigated all the prostate chips from the bladder  using an Ellik evacuator. We then fulgurated the resection bed at any sites  of bleeding systematically. A Foley catheter was then placed using  a  catheter guide. Of note, the patient still had some tissue at the bladder  neck. I am real concerned about undermining the bladder neck _________  without a catheter guide. A catheter guide was used to place the catheter,  it was placed without difficulty. Clear urine was evacuated. Final  irrigation was started. Lasix was given prior to the termination of the  procedure.   DISPOSITION:  The patient was taken to the recovery room in stable  condition.                                                 Melvyn Novas, M.D.    DK/MEDQ  D:  08/09/2002  T:  08/09/2002  Job:  [id number]

## 2011-01-18 NOTE — Op Note (Signed)
[place]  Patient:    Levi [name], Levi [name]                      MRN: [id number] Proc. Date: 08/02/00 Adm. Date:  [id number] Attending:  Susa Day                           Operative Report  PREOPERATIVE DIAGNOSIS:  Saw laceration, radial aspect, right wrist at base of thenar eminence and distal wrist flexion crease with uncontrolled arterial bleeding, and loss of sensibility on palmar surface of thumb, rule out laceration of radial artery and radial superficial femoral branch.  POSTOPERATIVE DIAGNOSIS:  Laceration of superficial branch of radial artery with arterial hemorrhage and laceration of palmar radial superficial sensory branch.  OPERATION: 1. Exploration of right radial wrist wound with identification of superficial    branch of radial artery laceration and chronic arterial hemorrhage.  This    was managed by identification of intact dorsal and proper radial artery    followed by ligation of the radial artery. 2. Microsurgical repair of radial superficial sensory branch.  SURGEON:  Katy Fitch. Sypher, Montez Hageman., M.D.  ASSISTANT:  Marveen Reeks Dasnoit, P.A.-C.  ANESTHESIA:  Axillary block.  SUPERVISING ANESTHESIOLOGIST:  Edwin Cap. Zoila Shutter, M.D.  INDICATIONS:  The patient is a 77 year old man who was in the process of putting away a sagittal saw at home earlier today.  He was wrapped the cord of the saw around the saw, and accidentally impaled his right dominant wrist on the saw blade.  He had immediate arterial bleeding from a 1.5 cm oblique laceration of the radial aspect of his wrist at the base of the thumb.  He was taken on an urgent basis to the [facility] on [address], where he was evaluated by Dr. Eula Listen.  Dr. Althea Charon was unable to control hemorrhage with just pressure and sought an urgent hand surgery consult.  The patient was placed in a compression dressing and transferred to the  [facility] Emergency Room for an urgent hand surgery evaluation followed by anticipated exploration and appropriate repairs.  Preoperatively, he was advised that we would repair his nerves and any tendon or arterial injury we deemed necessary upon exploration.  A preoperative History and Physical was completed.  Questions were invited and answered with the patient, his wife, and his son.  After informed consent, he was brought to the operating room at this time.  DESCRIPTION OF PROCEDURE:  The patient was brought to the operating room and placed in the supine position on the operating table.  Following placement of an axillary block in the holding area, anesthesia was satisfactory of the right arm.  The arm was then prepped with Betadine soap and solution, and sterilely draped.  A pneumatic tourniquet was applied to the proximal brachium.  Following exsanguination of the limb with an Esmarch bandage, the arterial tourniquet was inflated to 250 mmHg due to mild systolic hypertension.  The procedure commenced with debridement of the skin margins of the untidy saw wound.  These were freshened with a 15 blade.  The wound was extended with a Brunner zigzag incision to explore the radial artery proximal to the zone of injury.  The radial artery proper was identified.  Inversion tissue was dissected distally.  The dorsal branch of the radial artery passed deep to the first dorsal compartment tendons and was uninjured.  The superficial sensory branch was lacerated just at the level of its fascial perforation, and had retracted up near the thenar muscles.  This was the source of the chronic hemorrhage.  A considerable amount of clot was evacuated.  As the clot was cleared, it was obvious that he had lacerated the palmar branch of the radial superficial sensory branches.  Fortunately, the first dorsal compartment tendons were spared.  The wound was thoroughly lavaged with sterile  saline followed by debridement of all clot and foreign material.  The radial superficial branch was suture ligated with 4-0 Vicryl and was cauterized at its lumen.  The operating microscope was brought into position and the radial superficial sensory branch was repaired after attempted fascicular matching with 9-0 nylon using epineural technique.  An anatomic repair was achieved.  The wound was then lavaged with sterile saline and repaired with intradermal 3-0 Prolene and Steri-Strips.  A compressive dressing was applied with plaster splints, maintaining the wrist in radial deviation and the thumb in abduction at the [facility] and MP joints to relax the radial sensory branch.  There were no apparent complications.  The patient tolerated surgery and anesthesia well.  He was transferred to the recovery room with stable vital signs.  AFTERCARE:  He is a given a prescription for Percocet 5/225 one or two tablets p.o. q.4-6h. p.r.n. pain, a total of 24 tablets without refill.  He is also given Keflex 500 mg one p.o. q.8h. x 4 days as a prophylactic antibiotic.  He will continue to take a single aspirin tablet daily.  Note, that he was given a ____ tetanus booster in the holding area when it was identified that he had not had a tetanus booster for approximately 10 years. DD:  08/02/00 TD:  08/03/00 Job: [id number]

## 2011-01-21 ENCOUNTER — Encounter (HOSPITAL_COMMUNITY): Payer: Medicare Other

## 2011-01-23 ENCOUNTER — Encounter (HOSPITAL_COMMUNITY): Payer: Medicare Other

## 2011-01-25 ENCOUNTER — Encounter (HOSPITAL_COMMUNITY): Payer: Medicare Other

## 2011-01-28 ENCOUNTER — Encounter (HOSPITAL_COMMUNITY): Payer: Medicare Other

## 2011-01-30 ENCOUNTER — Encounter (HOSPITAL_COMMUNITY): Payer: Medicare Other

## 2011-02-01 ENCOUNTER — Encounter (HOSPITAL_COMMUNITY): Payer: Medicare Other

## 2011-02-04 ENCOUNTER — Encounter (HOSPITAL_COMMUNITY): Payer: Medicare Other

## 2011-02-06 ENCOUNTER — Encounter (HOSPITAL_COMMUNITY): Payer: Medicare Other

## 2011-05-08 ENCOUNTER — Telehealth: Payer: Self-pay | Admitting: Physician Assistant

## 2011-05-08 NOTE — Telephone Encounter (Signed)
Pt called Sun, 9/2, with c/o DOE for approx. 2 weeks. No CP. Has scheduled f/u with Dr Eden Emms on 10/3.  I instructed him to monitor for any worsening of symptoms, and that I would leave a message for our office RN staff to contact him for close f/u, and further recommendations.

## 2011-05-27 ENCOUNTER — Ambulatory Visit: Payer: Medicare Other | Admitting: Cardiovascular Disease

## 2011-06-04 ENCOUNTER — Encounter: Payer: Self-pay | Admitting: Cardiovascular Disease

## 2011-06-05 ENCOUNTER — Ambulatory Visit (INDEPENDENT_AMBULATORY_CARE_PROVIDER_SITE_OTHER): Payer: Medicare Other | Admitting: Cardiovascular Disease

## 2011-06-05 ENCOUNTER — Encounter: Payer: Self-pay | Admitting: Cardiovascular Disease

## 2011-06-05 DIAGNOSIS — I10 Essential (primary) hypertension: Secondary | ICD-10-CM

## 2011-06-05 DIAGNOSIS — I2510 Atherosclerotic heart disease of native coronary artery without angina pectoris: Secondary | ICD-10-CM | POA: Insufficient documentation

## 2011-06-05 DIAGNOSIS — R609 Edema, unspecified: Secondary | ICD-10-CM

## 2011-06-05 DIAGNOSIS — E785 Hyperlipidemia, unspecified: Secondary | ICD-10-CM

## 2011-06-05 MED ORDER — LOSARTAN POTASSIUM 50 MG PO TABS: 50.0000 mg | ORAL_TABLET | Freq: Every day | ORAL | Status: DC

## 2011-06-05 NOTE — Assessment & Plan Note (Signed)
No angina.  Low normal EF  Continue asa and beta blocker

## 2011-06-05 NOTE — Assessment & Plan Note (Signed)
Cholesterol is at goal.  Continue current dose of statin and diet Rx.  No myalgias or side effects.  F/U  LFT's in 6 months. Lab Results  Component Value Date   LDLCALC  Value: 3        Total Cholesterol/HDL:CHD Risk Coronary Heart Disease Risk Table                     Men   Women  1/2 Average Risk   3.4   3.3  Average Risk       5.0   4.4  2 X Average Risk   9.6   7.1  3 X Average Risk  23.4   11.0        Use the calculated Patient Ratio above and the CHD Risk Table to determine the patient's CHD Risk.        ATP III CLASSIFICATION (LDL):  <100     mg/dL   Optimal  161-096  mg/dL   Near or Above                    Optimal  130-159  mg/dL   Borderline  045-409  mg/dL   High  >811     mg/dL   Very High 91/47/8295

## 2011-06-05 NOTE — Progress Notes (Signed)
Levi [name] is seen today post hospital D/C CABG 08/22/10 . I reviewed his notes and cath films. He had recurrent indigestin and dyspnea. R/O and cath showed LM and 3vd with normal EF. Post op course complicated by PAF D/C on amiodarone and coumadin.  Marland Kitchen LDL cholesterol in hospital only 71 and Dr Clarisse Gouge stopped his statin. Maint NSR since D/C about a month ago and amiodarone stopped.  Now off coumadin. LE edema from venotomy will continue HCTZ/  No SSCP. No palpitaitons or TIA like symptoms Left carotid bruit with no high grade disease on preop duplex. Discussed exercise and diet restrictions. Does not want to go back to cardiac rehab.   SOB persists.  Functional with obesity.  Discussed exercise program and low carb diet   ROS: Denies fever, malais, weight loss, blurry vision, decreased visual acuity, cough, sputum, SOB, hemoptysis, pleuritic pain, palpitaitons, heartburn, abdominal pain, melena, lower extremity edema, claudication, or rash.  All other systems reviewed and negative  General: Affect appropriate Healthy:  appears stated age HEENT: normal Neck supple with no adenopathy JVP normal no bruits no thyromegaly Lungs clear with no wheezing and good diaphragmatic motion Heart:  S1/S2 no murmur,rub, gallop or click PMI normal Abdomen: benighn, BS positve, no tenderness, no AAA no bruit.  No HSM or HJR Distal pulses intact with no bruits Plus one bilateral edema Neuro non-focal Skin warm and dry No muscular weakness   Current Outpatient Prescriptions  Medication Sig Dispense Refill  . aspirin 81 MG tablet Take 81 mg by mouth daily.        . calcium carbonate 200 MG capsule Take 250 mg by mouth 2 (two) times daily with a meal.        . furosemide (LASIX) 20 MG tablet Take 1 tablet (20 mg total) by mouth daily.  90 tablet  2  . metoprolol tartrate (LOPRESSOR) 25 MG tablet 1/2 tab po bid  90 tablet  3    Allergies  Review of patient's allergies indicates no known  allergies.  Electrocardiogram:  Assessment and Plan

## 2011-06-05 NOTE — Assessment & Plan Note (Signed)
Start Cozaar low sodium F/U in 6 weeks

## 2011-06-05 NOTE — Patient Instructions (Signed)
Your physician recommends that you schedule a follow-up appointment in: 6 WEEKS WITH DR [facility] Your physician has recommended you make the following change in your medication: LOSARTAN  50 MG EVERY DAY

## 2011-06-05 NOTE — Assessment & Plan Note (Signed)
Continue current dose of lasix

## 2011-07-03 ENCOUNTER — Ambulatory Visit: Payer: Medicare Other | Admitting: Cardiovascular Disease

## 2011-07-12 ENCOUNTER — Ambulatory Visit: Payer: Medicare Other | Admitting: Cardiovascular Disease

## 2011-07-16 ENCOUNTER — Encounter: Payer: Self-pay | Admitting: Cardiovascular Disease

## 2011-07-16 ENCOUNTER — Ambulatory Visit (INDEPENDENT_AMBULATORY_CARE_PROVIDER_SITE_OTHER): Payer: Medicare Other | Admitting: Cardiovascular Disease

## 2011-07-16 DIAGNOSIS — I10 Essential (primary) hypertension: Secondary | ICD-10-CM

## 2011-07-16 DIAGNOSIS — I2510 Atherosclerotic heart disease of native coronary artery without angina pectoris: Secondary | ICD-10-CM

## 2011-07-16 DIAGNOSIS — E785 Hyperlipidemia, unspecified: Secondary | ICD-10-CM

## 2011-07-16 DIAGNOSIS — R609 Edema, unspecified: Secondary | ICD-10-CM

## 2011-07-16 MED ORDER — HYDROCHLOROTHIAZIDE 25 MG PO TABS: 25.0000 mg | ORAL_TABLET | Freq: Every day | ORAL | Status: DC

## 2011-07-16 MED ORDER — LOSARTAN POTASSIUM 50 MG PO TABS: 50.0000 mg | ORAL_TABLET | Freq: Every day | ORAL | Status: DC

## 2011-07-16 NOTE — Patient Instructions (Signed)
Your physician wants you to follow-up in:  6 months. You will receive a reminder letter in the mail two months in advance. If you don't receive a letter, please call our office to schedule the follow-up appointment.   

## 2011-07-16 NOTE — Assessment & Plan Note (Signed)
Well controlled.  Continue current medications and low sodium Dash type diet.    

## 2011-07-16 NOTE — Progress Notes (Signed)
Levi [name] is seen today post hospital D/C CABG 08/22/10 . I reviewed his notes and cath films. He had recurrent indigestin and dyspnea. R/O and cath showed LM and 3vd with normal EF. Post op course complicated by PAF D/C on amiodarone and coumadin. Marland Kitchen LDL cholesterol in hospital only 71 and Dr Cornelius Moras stopped his statin. Maint NSR since D/C about a month ago and amiodarone stopped. Now off coumadin. LE edema from venotomy will continue HCTZ/ No SSCP. No palpitaitons or TIA like symptoms Left carotid bruit with no high grade disease on preop duplex. Discussed exercise and diet restrictions. Does not want to go back to cardiac rehab.   ROS: Denies fever, malais, weight loss, blurry vision, decreased visual acuity, cough, sputum, SOB, hemoptysis, pleuritic pain, palpitaitons, heartburn, abdominal pain, melena, lower extremity edema, claudication, or rash.  All other systems reviewed and negative  General: Affect appropriate Healthy:  appears stated age HEENT: normal Neck supple with no adenopathy JVP normal no bruits no thyromegaly Lungs clear with no wheezing and good diaphragmatic motion Heart:  S1/S2 no murmur,rub, gallop or click PMI normal Abdomen: benighn, BS positve, no tenderness, no AAA no bruit.  No HSM or HJR Distal pulses intact with no bruits Plus one bilateral edema Neuro non-focal Skin warm and dry No muscular weakness   Current Outpatient Prescriptions  Medication Sig Dispense Refill  . aspirin 81 MG tablet Take 81 mg by mouth daily.        . furosemide (LASIX) 20 MG tablet Take 1 tablet (20 mg total) by mouth daily.  90 tablet  2  . hydrochlorothiazide (HYDRODIURIL) 25 MG tablet Take 25 mg by mouth daily.        Marland Kitchen losartan (COZAAR) 50 MG tablet Take 1 tablet (50 mg total) by mouth daily.  30 tablet  11  . metoprolol tartrate (LOPRESSOR) 25 MG tablet 1/2 tab po bid  90 tablet  3    Allergies  Review of patient's allergies indicates no known  allergies.  Electrocardiogram:  Assessment and Plan

## 2011-07-16 NOTE — Assessment & Plan Note (Signed)
No angina post CABG.  Continue ASA

## 2011-07-16 NOTE — Assessment & Plan Note (Signed)
Dependant from obesity  Continue low sodium diet and current dose of diuretic

## 2011-07-16 NOTE — Assessment & Plan Note (Signed)
Cholesterol is at goal.  Continue current dose of statin and diet Rx.  No myalgias or side effects.  F/U  LFT's in 6 months. Lab Results  Component Value Date   LDLCALC  Value: 71        Total Cholesterol/HDL:CHD Risk Coronary Heart Disease Risk Table                     Men   Women  1/2 Average Risk   3.4   3.3  Average Risk       5.0   4.4  2 X Average Risk   9.6   7.1  3 X Average Risk  23.4   11.0        Use the calculated Patient Ratio above and the CHD Risk Table to determine the patient's CHD Risk.        ATP III CLASSIFICATION (LDL):  <100     mg/dL   Optimal  100-129  mg/dL   Near or Above                    Optimal  130-159  mg/dL   Borderline  160-189  mg/dL   High  >190     mg/dL   Very High 08/21/2010             

## 2011-08-31 ENCOUNTER — Ambulatory Visit (INDEPENDENT_AMBULATORY_CARE_PROVIDER_SITE_OTHER): Payer: Medicare Other

## 2011-08-31 DIAGNOSIS — J111 Influenza due to unidentified influenza virus with other respiratory manifestations: Secondary | ICD-10-CM

## 2011-08-31 DIAGNOSIS — R05 Cough: Secondary | ICD-10-CM

## 2011-08-31 DIAGNOSIS — R071 Chest pain on breathing: Secondary | ICD-10-CM

## 2011-08-31 DIAGNOSIS — R059 Cough, unspecified: Secondary | ICD-10-CM

## 2011-09-23 ENCOUNTER — Telehealth: Payer: Self-pay | Admitting: Cardiovascular Disease

## 2011-09-23 NOTE — Telephone Encounter (Signed)
SPOKE WITH PT'S WIFE ,  PT UNABLE TO WALK ANY DISTANCE  WITHOUT COMPLAINING OF SOB. PER WIFE SYMPTOM NOT NEW  HAS BEEN GOING  ON SINCE  CABG ( OVER YEAR AGO) NO DIFFERENCE  . PER  WIFE NO  SWELLING NOTED  APPT MADE WITH LORI GERHARDT NP  FOR 09-25-11 AT 1:30 PM./CY

## 2011-09-23 NOTE — Telephone Encounter (Signed)
New Problem:    Patient's wife called because he seems to be doing worse after having his Open heart surgery and would like to have you give her a call back.  When offered to try and schedule an appointment for him, his wife said she preferred to speak with you first. Please call back.

## 2011-09-25 ENCOUNTER — Ambulatory Visit (INDEPENDENT_AMBULATORY_CARE_PROVIDER_SITE_OTHER): Payer: Medicare Other | Admitting: Nurse Practitioner

## 2011-09-25 ENCOUNTER — Encounter: Payer: Self-pay | Admitting: Nurse Practitioner

## 2011-09-25 VITALS — BP 118/72 | HR 61 | Ht 70.0 in | Wt 266.0 lb

## 2011-09-25 DIAGNOSIS — R5381 Other malaise: Secondary | ICD-10-CM | POA: Diagnosis not present

## 2011-09-25 DIAGNOSIS — R0989 Other specified symptoms and signs involving the circulatory and respiratory systems: Secondary | ICD-10-CM

## 2011-09-25 DIAGNOSIS — R0600 Dyspnea, unspecified: Secondary | ICD-10-CM

## 2011-09-25 DIAGNOSIS — R5383 Other fatigue: Secondary | ICD-10-CM

## 2011-09-25 DIAGNOSIS — R0609 Other forms of dyspnea: Secondary | ICD-10-CM

## 2011-09-25 LAB — BASIC METABOLIC PANEL
BUN: 20 mg/dL (ref 6–23)
CO2: 30 mEq/L (ref 19–32)
Calcium: 9.1 mg/dL (ref 8.4–10.5)
Chloride: 100 mEq/L (ref 96–112)
Creatinine, Ser: 1.1 mg/dL (ref 0.4–1.5)
GFR: 70.58 mL/min (ref >60.00)
Glucose, Bld: 82 mg/dL (ref 70–99)
Potassium: 3.6 mEq/L (ref 3.5–5.1)
Sodium: 138 mEq/L (ref 135–145)

## 2011-09-25 LAB — CBC WITH DIFFERENTIAL/PLATELET
Basophils Absolute: 0 10*3/uL (ref 0.0–0.1)
Basophils Relative: 0.4 % (ref 0.0–3.0)
Eosinophils Absolute: 0.4 10*3/uL (ref 0.0–0.7)
Eosinophils Relative: 4.6 % (ref 0.0–5.0)
HCT: 42.8 % (ref 39.0–52.0)
Hemoglobin: 14.8 g/dL (ref 13.0–17.0)
Lymphocytes Relative: 23.5 % (ref 12.0–46.0)
Lymphs Abs: 2 10*3/uL (ref 0.7–4.0)
MCHC: 34.6 g/dL (ref 30.0–36.0)
MCV: 90.6 fl (ref 78.0–100.0)
Monocytes Absolute: 0.8 10*3/uL (ref 0.1–1.0)
Monocytes Relative: 9.3 % (ref 3.0–12.0)
Neutro Abs: 5.3 10*3/uL (ref 1.4–7.7)
Neutrophils Relative %: 62.2 % (ref 43.0–77.0)
Platelets: 241 10*3/uL (ref 150.0–400.0)
RBC: 4.72 Mil/uL (ref 4.22–5.81)
RDW: 13.4 % (ref 11.5–14.6)
WBC: 8.6 10*3/uL (ref 4.5–10.5)

## 2011-09-25 LAB — BRAIN NATRIURETIC PEPTIDE: Pro B Natriuretic peptide (BNP): 50 pg/mL (ref 0.0–100.0)

## 2011-09-25 LAB — TSH: TSH: 3.68 u[IU]/mL (ref 0.35–5.50)

## 2011-09-25 NOTE — Progress Notes (Signed)
   Levi [name] Date of Birth: 1928/11/22 Medical Record #[id number]  History of Present Illness: Levi [name] is seen today for a work in visit. He is seen for Dr. Eden Emms. He is an 78 year old male with known CAD, prior CABG a little over one year ago. He does have HTN and is obese. Last visit here was in November.  He comes in today. He remains short of breath. He notes that he just can't do anything. This has been a chronic issue but he thinks it is worse. He thinks something is wrong with him and his wife agrees. He does not have chest pain but doesn't look like he had chest pain prior to his surgery. Has only been short of breath. EF in 2011 was normal. He does remain obese. He is on 2 diuretics but does not think he is taking both. He does report exertional fatigue. He is not having any lightheaded or dizziness. No syncope reported.   Current Outpatient Prescriptions on File Prior to Visit  Medication Sig Dispense Refill  . aspirin 81 MG tablet Take 81 mg by mouth daily.        . furosemide (LASIX) 20 MG tablet Take 1 tablet (20 mg total) by mouth daily.  90 tablet  2  . losartan (COZAAR) 50 MG tablet Take 1 tablet (50 mg total) by mouth daily.  90 tablet  3  . DISCONTD: metoprolol tartrate (LOPRESSOR) 25 MG tablet 1/2 tab po bid  90 tablet  3  . hydrochlorothiazide (HYDRODIURIL) 25 MG tablet Take 1 tablet (25 mg total) by mouth daily.  90 tablet  3    No Known Allergies  Past Medical History  Diagnosis Date  . HTN (hypertension)   . HLD (hyperlipidemia)   . Chest pain   . Painful respiration   . Edema   . Benign neoplasm of colon   . Dyspnea on exertion   . Prostate cancer   . Osteopenia   . Emphysema   . Bronchitis   . Obesity   . Malaise and fatigue     Past Surgical History  Procedure Date  . Median sternotomy 08/23/10  . Coronary artery bypass graft 08/23/10  . Cervical disc surgery     History  Smoking status  . Former Smoker  Smokeless tobacco  . Not on  file    History  Alcohol Use No    Family History  Problem Relation Age of Onset  . Heart disease Brother   . Skin cancer Brother   . Prostate cancer Brother   . Kidney cancer Father   . Heart attack Father     Review of Systems: The review of systems is positive for worsening DOE with exertional fatigue.  All other systems were reviewed and are negative.  Physical Exam: BP 118/72  Pulse 61  Ht 5\' 10"  (1.778 m)  Wt 266 lb (120.657 kg)  BMI 38.17 kg/m2  SpO2 97% Patient is very pleasant and in no acute distress. He is obese. Skin is warm and dry. Color is normal.  HEENT is unremarkable. Normocephalic/atraumatic. PERRL. Sclera are nonicteric. Neck is supple. No masses. No JVD. Lungs are clear with decreased breath sounds. Cardiac exam shows a regular rate and rhythm. Abdomen is obese but soft. Extremities are full but without edema. Gait and ROM are intact. No gross neurologic deficits noted.   LABORATORY DATA: PENDING  Assessment / Plan:

## 2011-09-25 NOTE — Patient Instructions (Signed)
We are going to check some labs today to include a test that tells [place] if your shortness of breath is coming from your heart or from your lungs.  We are going to arrange for a stress test to make sure your heart is ok.   We may need to send you to the lung doctor to evaluate your breathing.   For now, stay on your current medicines.   Call the [facility] office at [phone] if you have any questions, problems or concerns.

## 2011-09-25 NOTE — Assessment & Plan Note (Signed)
He is complaining of worsening symptoms. Does not appear that he had actual chest pain prior to his CABG. Also with exertional fatigue. We will check labs today. We will arrange for stress testing. I will check a BNP level today. We will check an EKG today as well. If his BNP is normal, he could be referred to pulmonary. Further disposition to follow. Patient is agreeable to this plan and will call if any problems develop in the interim.

## 2011-10-02 ENCOUNTER — Ambulatory Visit (HOSPITAL_COMMUNITY): Payer: Medicare Other | Attending: Cardiology | Admitting: Radiology

## 2011-10-02 DIAGNOSIS — Z87891 Personal history of nicotine dependence: Secondary | ICD-10-CM | POA: Insufficient documentation

## 2011-10-02 DIAGNOSIS — E785 Hyperlipidemia, unspecified: Secondary | ICD-10-CM | POA: Diagnosis not present

## 2011-10-02 DIAGNOSIS — R5381 Other malaise: Secondary | ICD-10-CM | POA: Insufficient documentation

## 2011-10-02 DIAGNOSIS — E669 Obesity, unspecified: Secondary | ICD-10-CM | POA: Diagnosis not present

## 2011-10-02 DIAGNOSIS — I10 Essential (primary) hypertension: Secondary | ICD-10-CM | POA: Diagnosis not present

## 2011-10-02 DIAGNOSIS — R0602 Shortness of breath: Secondary | ICD-10-CM | POA: Diagnosis not present

## 2011-10-02 DIAGNOSIS — Z8249 Family history of ischemic heart disease and other diseases of the circulatory system: Secondary | ICD-10-CM | POA: Insufficient documentation

## 2011-10-02 DIAGNOSIS — R0609 Other forms of dyspnea: Secondary | ICD-10-CM | POA: Insufficient documentation

## 2011-10-02 DIAGNOSIS — Z951 Presence of aortocoronary bypass graft: Secondary | ICD-10-CM | POA: Diagnosis not present

## 2011-10-02 DIAGNOSIS — I4510 Unspecified right bundle-branch block: Secondary | ICD-10-CM | POA: Insufficient documentation

## 2011-10-02 DIAGNOSIS — R000 Tachycardia, unspecified: Secondary | ICD-10-CM | POA: Diagnosis not present

## 2011-10-02 DIAGNOSIS — R079 Chest pain, unspecified: Secondary | ICD-10-CM | POA: Insufficient documentation

## 2011-10-02 DIAGNOSIS — I4891 Unspecified atrial fibrillation: Secondary | ICD-10-CM

## 2011-10-02 DIAGNOSIS — R0989 Other specified symptoms and signs involving the circulatory and respiratory systems: Secondary | ICD-10-CM | POA: Insufficient documentation

## 2011-10-02 DIAGNOSIS — R5383 Other fatigue: Secondary | ICD-10-CM

## 2011-10-02 DIAGNOSIS — I2510 Atherosclerotic heart disease of native coronary artery without angina pectoris: Secondary | ICD-10-CM

## 2011-10-02 MED ORDER — TECHNETIUM TC 99M TETROFOSMIN IV KIT
30.0000 | PACK | Freq: Once | INTRAVENOUS | Status: AC | PRN
  Administered 2011-10-02: 30 via INTRAVENOUS

## 2011-10-02 MED ORDER — REGADENOSON 0.4 MG/5ML IV SOLN
0.4000 mg | Freq: Once | INTRAVENOUS | Status: AC
  Administered 2011-10-02: 0.4 mg via INTRAVENOUS

## 2011-10-02 MED ORDER — TECHNETIUM TC 99M TETROFOSMIN IV KIT
10.0000 | PACK | Freq: Once | INTRAVENOUS | Status: AC | PRN
  Administered 2011-10-02: 10 via INTRAVENOUS

## 2011-10-02 NOTE — Progress Notes (Signed)
[facility] SITE 3 NUCLEAR MED [address] [place] [address] [phone]  Cardiology Nuclear Med Study  Levi [name] is a 78 y.o. male [id number] [birth year]   Nuclear Med Background Indication for Stress Test:  Evaluation for Ischemia and Graft Patency History:2007 Myocardial Perfusion Study EF 59% 2011 Heart Catheterization  12/11  CABG, 2011 Echo-EF 55-60% Cardiac Risk Factors: Family History - CAD, History of Smoking, Hypertension, Lipids, Obesity and RBBB  Symptoms:  Chest Pain, DOE, Fatigue, Rapid HR and SOB   Nuclear Pre-Procedure Caffeine/Decaff Intake:  None NPO After: 8:30pm   Lungs:  Clear IV 0.9% NS with Angio Cath:  18g  IV Site: R Antecubital  IV Started by:  Stanton Kidney, EMT-P  Chest Size (in):  48 Cup Size: n/a  Height: 5\' 10"  (1.778 m)  Weight:  265 lb (120.203 kg)  BMI:  Body mass index is 38.02 kg/(m^2). Tech Comments:  NA    Nuclear Med Study 1 or 2 day study: 1 day  Stress Test Type:  Lexiscan  Reading MD: Marca Ancona, MD  Order Authorizing Provider:  Charlton Haws, MD  Resting Radionuclide: Technetium 47m Tetrofosmin  Resting Radionuclide Dose: 11.0 mCi   Stress Radionuclide:  Technetium 67m Tetrofosmin  Stress Radionuclide Dose: 33.0 mCi           Stress Protocol Rest HR: 50 Stress HR: 62  Rest BP: 128/79 Stress BP: 100/64  Exercise Time (min): n/a METS: n/a   Predicted Max HR: 140 bpm % Max HR: 52.86 bpm Rate Pressure Product: 8584   Dose of Adenosine (mg):  n/a Dose of Lexiscan: 0.4 mg  Dose of Atropine (mg): n/a Dose of Dobutamine: n/a mcg/kg/min (at max HR)  Stress Test Technologist: Bonnita Levan, RN  Nuclear Technologist:  Domenic Polite, CNMT     Rest Procedure:  Myocardial perfusion imaging was performed at rest 45 minutes following the intravenous administration of Technetium 55m Tetrofosmin. Rest ECG: Sinus Bradycardia, RBBB  Stress Procedure:  The patient received IV Lexiscan 0.4 mg over 15-seconds.   Technetium 91m Tetrofosmin injected at 30-seconds.  There were no significant changes with Lexiscan. Patient became hypotensive with injection(63/44), and placed in Trendelenburg position. BP improved in recovery. Quantitative spect images were obtained after a 45 minute delay. Stress ECG: No significant change from baseline ECG  QPS Raw Data Images:  Normal; no motion artifact; normal heart/lung ratio. Stress Images:  Small, Mild to moderate in intensity apical inferior perfusion defect.  Rest Images:  Normal homogeneous uptake in all areas of the myocardium. Subtraction (SDS):  Reversible apical inferior perfusion defect.  Transient Ischemic Dilatation (Normal <1.22):  1.17 Lung/Heart Ratio (Normal <0.45):  0.38  Quantitative Gated Spect Images QGS EDV:  109 ml QGS ESV:  46 ml QGS cine images:  Septal hypokinesis.  QGS EF: 58%  Impression Exercise Capacity:  Lexiscan with no exercise. BP Response:  Hypotensive blood pressure response. Clinical Symptoms:  Lightheaded.  ECG Impression:  RBBB, no change with infusion.  Comparison with Prior Nuclear Study: Apical inferior perfusion defect not present on prior study.   Overall Impression:  Abnormal stress nuclear study.  There is a small, mild to moderate in intensity apical inferior perfusion defect.  This was not seen on the prior study.  This may represent ischemia.  EF normal with septal hypokinesis.    Levi [name] [employer]

## 2011-10-03 ENCOUNTER — Encounter: Payer: Self-pay | Admitting: *Deleted

## 2011-10-04 ENCOUNTER — Other Ambulatory Visit (INDEPENDENT_AMBULATORY_CARE_PROVIDER_SITE_OTHER): Payer: Medicare Other | Admitting: *Deleted

## 2011-10-04 ENCOUNTER — Other Ambulatory Visit: Payer: Self-pay | Admitting: *Deleted

## 2011-10-04 DIAGNOSIS — Z01810 Encounter for preprocedural cardiovascular examination: Secondary | ICD-10-CM

## 2011-10-04 DIAGNOSIS — R0602 Shortness of breath: Secondary | ICD-10-CM

## 2011-10-04 LAB — PROTIME-INR: Prothrombin Time: 10.8 s (ref 10.2–12.4)

## 2011-10-05 ENCOUNTER — Other Ambulatory Visit: Payer: Self-pay | Admitting: Cardiovascular Disease

## 2011-10-07 ENCOUNTER — Encounter (HOSPITAL_BASED_OUTPATIENT_CLINIC_OR_DEPARTMENT_OTHER)
Admission: RE | Disposition: A | Discharge status: Home / Self Care | Payer: Self-pay | Source: Ambulatory Visit | Attending: Cardiovascular Disease

## 2011-10-07 ENCOUNTER — Inpatient Hospital Stay (HOSPITAL_BASED_OUTPATIENT_CLINIC_OR_DEPARTMENT_OTHER)
Admission: RE | Admit: 2011-10-07 | Discharge: 2011-10-07 | Disposition: A | Discharge status: Home / Self Care | Payer: Medicare Other | Source: Ambulatory Visit | Attending: Cardiovascular Disease | Admitting: Cardiovascular Disease

## 2011-10-07 DIAGNOSIS — I25810 Atherosclerosis of coronary artery bypass graft(s) without angina pectoris: Secondary | ICD-10-CM | POA: Insufficient documentation

## 2011-10-07 DIAGNOSIS — R0609 Other forms of dyspnea: Secondary | ICD-10-CM | POA: Insufficient documentation

## 2011-10-07 DIAGNOSIS — R9439 Abnormal result of other cardiovascular function study: Secondary | ICD-10-CM | POA: Insufficient documentation

## 2011-10-07 DIAGNOSIS — I2510 Atherosclerotic heart disease of native coronary artery without angina pectoris: Secondary | ICD-10-CM

## 2011-10-07 DIAGNOSIS — R0989 Other specified symptoms and signs involving the circulatory and respiratory systems: Secondary | ICD-10-CM | POA: Insufficient documentation

## 2011-10-07 LAB — POCT I-STAT 3, VENOUS BLOOD GAS (G3P V)
Bicarbonate: 25.6 mEq/L — ABNORMAL HIGH (ref 20.0–24.0)
pH, Ven: 7.369 — ABNORMAL HIGH (ref 7.250–7.300)
pO2, Ven: 36 mmHg (ref 30.0–45.0)

## 2011-10-07 LAB — POCT I-STAT 3, ART BLOOD GAS (G3+)
Acid-Base Excess: 1 mmol/L (ref 0.0–2.0)
pH, Arterial: 7.414 (ref 7.350–7.450)

## 2011-10-07 SURGERY — JV LEFT HEART CATHETERIZATION WITH CORONARY/GRAFT ANGIOGRAM
Anesthesia: Moderate Sedation

## 2011-10-07 SURGERY — JV LEFT AND RIGHT HEART CATHETERIZATION WITH CORONARY ANGIOGRAM
Anesthesia: Moderate Sedation

## 2011-10-07 MED ORDER — ACETAMINOPHEN 325 MG PO TABS: 650.0000 mg | ORAL_TABLET | ORAL | Status: DC | PRN

## 2011-10-07 MED ORDER — SODIUM CHLORIDE 0.9 % IJ SOLN: 3.0000 mL | INTRAMUSCULAR | Status: DC | PRN

## 2011-10-07 MED ORDER — ONDANSETRON HCL 4 MG/2ML IJ SOLN: 4.0000 mg | Freq: Four times a day (QID) | INTRAMUSCULAR | Status: DC | PRN

## 2011-10-07 MED ORDER — SODIUM CHLORIDE 0.9 % IV SOLN: 250.0000 mL | INTRAVENOUS | Status: DC | PRN

## 2011-10-07 MED ORDER — SODIUM CHLORIDE 0.9 % IV SOLN
INTRAVENOUS | Status: DC
  Administered 2011-10-07: 08:00:00 via INTRAVENOUS

## 2011-10-07 MED ORDER — SODIUM CHLORIDE 0.9 % IV SOLN: INTRAVENOUS | Status: AC

## 2011-10-07 MED ORDER — ASPIRIN 81 MG PO CHEW
324.0000 mg | CHEWABLE_TABLET | ORAL | Status: AC
  Administered 2011-10-07: 324 mg via ORAL

## 2011-10-07 MED ORDER — SODIUM CHLORIDE 0.9 % IJ SOLN: 3.0000 mL | Freq: Two times a day (BID) | INTRAMUSCULAR | Status: DC

## 2011-10-07 NOTE — H&P (View-Only) (Signed)
   Levi [name] Date of Birth: 05/23/1929 Medical Record #[id number]  History of Present Illness: [name] is seen today for a work in visit. He is seen for Dr. Nishan. He is an 78 year old male with known CAD, prior CABG a little over one year ago. He does have HTN and is obese. Last visit here was in November.  He comes in today. He remains short of breath. He notes that he just can't do anything. This has been a chronic issue but he thinks it is worse. He thinks something is wrong with him and his wife agrees. He does not have chest pain but doesn't look like he had chest pain prior to his surgery. Has only been short of breath. EF in 2011 was normal. He does remain obese. He is on 2 diuretics but does not think he is taking both. He does report exertional fatigue. He is not having any lightheaded or dizziness. No syncope reported.   Current Outpatient Prescriptions on File Prior to Visit  Medication Sig Dispense Refill  . aspirin 81 MG tablet Take 81 mg by mouth daily.        . furosemide (LASIX) 20 MG tablet Take 1 tablet (20 mg total) by mouth daily.  90 tablet  2  . losartan (COZAAR) 50 MG tablet Take 1 tablet (50 mg total) by mouth daily.  90 tablet  3  . DISCONTD: metoprolol tartrate (LOPRESSOR) 25 MG tablet 1/2 tab po bid  90 tablet  3  . hydrochlorothiazide (HYDRODIURIL) 25 MG tablet Take 1 tablet (25 mg total) by mouth daily.  90 tablet  3    No Known Allergies  Past Medical History  Diagnosis Date  . HTN (hypertension)   . HLD (hyperlipidemia)   . Chest pain   . Painful respiration   . Edema   . Benign neoplasm of colon   . Dyspnea on exertion   . Prostate cancer   . Osteopenia   . Emphysema   . Bronchitis   . Obesity   . Malaise and fatigue     Past Surgical History  Procedure Date  . Median sternotomy 08/23/10  . Coronary artery bypass graft 08/23/10  . Cervical disc surgery     History  Smoking status  . Former Smoker  Smokeless tobacco  . Not on  file    History  Alcohol Use No    Family History  Problem Relation Age of Onset  . Heart disease Brother   . Skin cancer Brother   . Prostate cancer Brother   . Kidney cancer Father   . Heart attack Father     Review of Systems: The review of systems is positive for worsening DOE with exertional fatigue.  All other systems were reviewed and are negative.  Physical Exam: BP 118/72  Pulse 61  Ht 5' 10" (1.778 m)  Wt 266 lb (120.657 kg)  BMI 38.17 kg/m2  SpO2 97% Patient is very pleasant and in no acute distress. He is obese. Skin is warm and dry. Color is normal.  HEENT is unremarkable. Normocephalic/atraumatic. PERRL. Sclera are nonicteric. Neck is supple. No masses. No JVD. Lungs are clear with decreased breath sounds. Cardiac exam shows a regular rate and rhythm. Abdomen is obese but soft. Extremities are full but without edema. Gait and ROM are intact. No gross neurologic deficits noted.   LABORATORY DATA: PENDING  Assessment / Plan:  

## 2011-10-07 NOTE — Progress Notes (Signed)
Bedrest begins @ 0935.

## 2011-10-07 NOTE — Op Note (Signed)
Cardiac Catheterization Operative Report  Levi [name] [id number] 2/4/20139:22 AM Aura Dials, MD, MD  Procedure Performed:  1. Left Heart Catheterization 2. Selective Coronary Angiography 3. SVG angiography 4. LIMA graft angiography 5. Right Heart Catheterization 6. Left ventricular angiogram  Operator: Verne Carrow, MD  Indication: Dyspnea, prior CABG 12/11.   Abnormal stress myoview.                             Procedure Details: The risks, benefits, complications, treatment options, and expected outcomes were discussed with the patient. The patient and/or family concurred with the proposed plan, giving informed consent. The patient was brought to the outpatient cath lab after IV hydration was begun and oral premedication was given. The patient was further sedated with Versed and Fentanyl. The right groin was prepped and draped in the usual manner. Using the modified Seldinger access technique, a 4 French sheath was placed in the right femoral artery. A 6 French sheath was inserted into the right femoral vein. A multi-purpose catheter was used to perform a right heart catheterization. Standard diagnostic catheters were used to perform selective coronary angiography. An LCB catheter was used to engage the left sided vein grafts. A 3DRC catheter was used to engage the LIMA graft. A pigtail catheter was used to perform a left ventricular angiogram. There were no immediate complications. The patient was taken to the recovery area in stable condition.   Hemodynamic Findings: Ao:  154/74     LV:  157/15/17 RA:  8              RV:  34/6/9 PA:  37/11 (mean 21)       PCWP:  20 Fick Cardiac Output: 4.9 L/min Fick Cardiac Index:  2.1 L/min/m2 Central Aortic Saturation: 96% Pulmonary Artery Saturation: 66%   Angiographic Findings:  Left main:  Distal 70% stenosis.   Left Anterior Descending Artery: The ostium has 80% stenosis. The proximal LAD has diffuse 70% stenosis. The  first diagonal branch is occluded. The diagonal branch fills from the LIMA graft. The mid vessel has serial 90% lesions after the takeoff of the diagonal branch. The moderate sized septal perforating branch has 99% ostial stenosis. The distal vessel has diffuse plaque disease.   Circumflex Artery: This is a moderate sized vessel with diffuse disease. The proximal vessel has diffuse 50% stenosis. The OM branch has serial 70% lesions. The vein graft to this vessel is now occluded. The posterolateral branch has mild plaque.   Right Coronary Artery: Large dominant vessel with 30% proximal stenosis and mild plaque mid and distal. The posterolateral branch has 50% stenosis. The PDA is small and diffusely diseased.   SVG to PDA is occluded.  SVG to LAD is occluded.  SVG to OM is occluded.  LIMA to Diagonal is patent.   Left Ventricular Angiogram: LVEF 55%.    Impression: 1. Severe triple vessel CAD s/p 4 V CABG with only 1 patent bypass graft.  2. Severe disease in native LAD and Circumflex.  3. Preserved LV systolic function.   Recommendations: He has complex disease in his LAD that begins at the ostium and extends throughout the entire vessel. The LIMA has been used for the diagonal bypass. PCI of the LAD would not be beneficial. He also has disease in his Circumflex involving the OM. The distal LM has 70% narrowing. This makes PCI more difficult. Will attempt medical management for now. Will d/w Dr. Eden Emms.  If we cannot manage his symptoms with medical management, may have to consider PCI in the future.        Complications:  None; patient tolerated the procedure well.

## 2011-10-07 NOTE — Interval H&P Note (Signed)
History and Physical Interval Note:  10/07/2011 8:37 AM  Levi [name]  has presented today for cardiac cath with the diagnosis of Chest Pain abnormal myovue  The various methods of treatment have been discussed with the patient and family. After consideration of risks, benefits and other options for treatment, the patient has consented to  Procedure(s): JV LEFT HEART CATHETERIZATION WITH CORONARY/GRAFT ANGIOGRAM as a surgical intervention .  The patients' history has been reviewed, patient examined, no change in status, stable for surgery.  I have reviewed the patients' chart and labs.  Questions were answered to the patient's satisfaction.     [name]

## 2011-10-07 NOTE — Progress Notes (Signed)
Venous 6 french sheath removed @ 0924 by Theodoro Grist. Arterial 4 french sheath removed @ 0926 by Theodoro Grist.

## 2011-10-07 NOTE — Progress Notes (Signed)
Discharge instructions completed with patient and wife.  Heart Healthy diet info given to patient.  Discharged to home via wheelchair.

## 2011-10-08 ENCOUNTER — Telehealth: Payer: Self-pay | Admitting: Cardiovascular Disease

## 2011-10-08 NOTE — Telephone Encounter (Signed)
LOV x4 faxed to [facility] @ [phone] 10/08/11/KM

## 2011-10-24 ENCOUNTER — Ambulatory Visit (INDEPENDENT_AMBULATORY_CARE_PROVIDER_SITE_OTHER): Payer: Medicare Other | Admitting: Cardiovascular Disease

## 2011-10-24 ENCOUNTER — Encounter: Payer: Self-pay | Admitting: Cardiovascular Disease

## 2011-10-24 DIAGNOSIS — I2510 Atherosclerotic heart disease of native coronary artery without angina pectoris: Secondary | ICD-10-CM

## 2011-10-24 DIAGNOSIS — J438 Other emphysema: Secondary | ICD-10-CM

## 2011-10-24 DIAGNOSIS — E785 Hyperlipidemia, unspecified: Secondary | ICD-10-CM

## 2011-10-24 DIAGNOSIS — I10 Essential (primary) hypertension: Secondary | ICD-10-CM

## 2011-10-24 DIAGNOSIS — M549 Dorsalgia, unspecified: Secondary | ICD-10-CM

## 2011-10-24 MED ORDER — NITROGLYCERIN 0.4 MG SL SUBL: 0.4000 mg | SUBLINGUAL_TABLET | SUBLINGUAL | Status: DC | PRN

## 2011-10-24 MED ORDER — ATORVASTATIN CALCIUM 40 MG PO TABS: 40.0000 mg | ORAL_TABLET | Freq: Every day | ORAL | Status: DC

## 2011-10-24 NOTE — Assessment & Plan Note (Signed)
Change to generic lipitor to save money

## 2011-10-24 NOTE — Assessment & Plan Note (Signed)
Occluded SVG;s and patent Lima to D1/  Native LM disease.  Medical Rx unless intractable symptoms as only option would be fairly high risk stenting per CM.  Nitro called in to [employer] [facility]

## 2011-10-24 NOTE — Assessment & Plan Note (Signed)
Effusion resolved.  Dyspnea from obesity and COPD.  Right heart ok and normal EF  Dont think this is anginal equivalent

## 2011-10-24 NOTE — Assessment & Plan Note (Signed)
Well controlled.  Continue current medications and low sodium Dash type diet.    

## 2011-10-24 NOTE — Assessment & Plan Note (Signed)
Will call Dr Everlene Other to arrange plain films of right hip and ? MRI of lumbar spine.

## 2011-10-24 NOTE — Progress Notes (Signed)
Elderly male with recent cath by Dr Gabriel Carina.  Myovue 1/30 prior to cath with small amount of ischemia  Overall Impression: Abnormal stress nuclear study. There is a small, mild to moderate in intensity apical inferior perfusion defect. This was not seen on the prior study. This may represent ischemia. EF normal with septal hypokinesis.   Cath Results:  Impression:  1. Severe triple vessel CAD s/p 4 V CABG with only 1 patent bypass graft. Lima to D1 patent 2. Severe disease in native LAD and Circumflex.  3. Preserved LV systolic function.  Recommendations: He has complex disease in his LAD that begins at the ostium and extends throughout the entire vessel. The LIMA has been used for the diagonal bypass. PCI of the LAD would not be beneficial. He also has disease in his Circumflex involving the OM. The distal LM has 70% narrowing. This makes PCI more difficult. Will attempt medical management for now. Will d/w Dr. Eden Emms. If we cannot manage his symptoms with medical management, may have to consider PCI in the future.  Has had some dyspnea:  Right heart done at time of cath with reasonable PCWP and no pulmonary hypertension  Hemodynamic Findings:  Ao: 154/74  LV: 157/15/17  RA: 8  RV: 34/6/9  PA: 37/11 (mean 21)  PCWP: 20  Fick Cardiac Output: 4.9 L/min  Fick Cardiac Index: 2.1 L/min/m2  Central Aortic Saturation: 96%  Pulmonary Artery Saturation: 66%k  Complains of cost of crestor and Ranexa.  Has significant back pain on right with previous history of pinched nerve  Pain starts in lower back and radiates To inner thigh on right  ROS: Denies fever, malais, weight loss, blurry vision, decreased visual acuity, cough, sputum,  hemoptysis, pleuritic pain, palpitaitons, heartburn, abdominal pain, melena, lower extremity edema, claudication, or rash.  All other systems reviewed and negative  General: Affect appropriate Healthy:  obese HEENT: normal Neck supple with no adenopathy JVP  normal no bruits no thyromegaly Lungs clear with no wheezing and good diaphragmatic motion Heart:  S1/S2 no murmur, no rub, gallop or click PMI normal Abdomen: benighn, BS positve, no tenderness, no AAA no bruit.  No HSM or HJR Distal pulses intact with no bruits No edema Neuro non-focal Skin warm and dry No muscular weakness   Current Outpatient Prescriptions  Medication Sig Dispense Refill  . aspirin 81 MG tablet Take 81 mg by mouth daily.        . CRESTOR 10 MG tablet Take 1 tablet by mouth Daily.      . hydrochlorothiazide (HYDRODIURIL) 25 MG tablet Take 1 tablet (25 mg total) by mouth daily.  90 tablet  3  . losartan (COZAAR) 50 MG tablet Take 1 tablet (50 mg total) by mouth daily.  90 tablet  3  . metoprolol tartrate (LOPRESSOR) 25 MG tablet Take 12.5 mg by mouth 2 (two) times daily. 1/2 tab po bid      . ranolazine (RANEXA) 500 MG 12 hr tablet Take 500 mg by mouth 2 (two) times daily.        Allergies  Review of patient's allergies indicates no known allergies.  Electrocardiogram:  Assessment and Plan

## 2011-10-24 NOTE — Patient Instructions (Addendum)
Your physician recommends that you schedule a follow-up appointment in:  3 MONTHS WITH DR [facility] Your physician has recommended you make the following change in your medication: STOP CRESTOR  START ATORVASTATIN 40 MG EVERY DAY

## 2011-12-18 ENCOUNTER — Telehealth: Payer: Self-pay | Admitting: Cardiovascular Disease

## 2011-12-18 NOTE — Telephone Encounter (Signed)
Please return call to patient wife Bernardino Dowell [phone]  [facility] request call from Dr. Ladona Ridgel, who is suppose to be looking over patients chart to make medical treatment suggestion even though patient see's Nishan.  Please return call to patient to discuss future treatment. Patient request this message be sent to [facility] and [place].

## 2011-12-18 NOTE — Telephone Encounter (Signed)
Spoke with Ms Walkins and Dr Ladona Ridgel is going to discuss Mr Barton husband with Dr Eden Emms He had CABG X 4 and 3 of the 4 grafts are occluded.  She says he was told they could not do anything for him and Dr Ladona Ridgel was going to talk with Dr Eden Emms about it

## 2011-12-24 NOTE — Telephone Encounter (Signed)
Dr Eden Emms is going to review the films tomorrow and call the patient

## 2011-12-26 ENCOUNTER — Telehealth: Payer: Self-pay | Admitting: Cardiovascular Disease

## 2011-12-26 ENCOUNTER — Other Ambulatory Visit: Payer: Self-pay | Admitting: *Deleted

## 2011-12-26 MED ORDER — RANOLAZINE ER 500 MG PO TB12: 500.0000 mg | ORAL_TABLET | Freq: Two times a day (BID) | ORAL | Status: DC

## 2011-12-26 NOTE — Telephone Encounter (Signed)
PT'S WIFE AWARE  RANEXA CALLED INTO  PHARMACY TODAY. WILL SEND A 90 DAY SCRIPT  WHEN VISITS IN MAY  TO MAKE SURE  PT TO CONT WITH MED .Zack Seal

## 2011-12-26 NOTE — Telephone Encounter (Signed)
Fu call °Pt returning your call  °

## 2012-01-06 ENCOUNTER — Other Ambulatory Visit: Payer: Self-pay

## 2012-01-06 MED ORDER — METOPROLOL TARTRATE 25 MG PO TABS: 12.5000 mg | ORAL_TABLET | Freq: Two times a day (BID) | ORAL | Status: DC

## 2012-01-13 ENCOUNTER — Encounter: Payer: Self-pay | Admitting: Cardiovascular Disease

## 2012-01-13 ENCOUNTER — Ambulatory Visit (INDEPENDENT_AMBULATORY_CARE_PROVIDER_SITE_OTHER): Payer: Medicare Other | Admitting: Cardiovascular Disease

## 2012-01-13 VITALS — BP 121/72 | HR 54 | Ht 70.0 in | Wt 267.0 lb

## 2012-01-13 DIAGNOSIS — E785 Hyperlipidemia, unspecified: Secondary | ICD-10-CM

## 2012-01-13 DIAGNOSIS — I2510 Atherosclerotic heart disease of native coronary artery without angina pectoris: Secondary | ICD-10-CM

## 2012-01-13 DIAGNOSIS — I10 Essential (primary) hypertension: Secondary | ICD-10-CM

## 2012-01-13 DIAGNOSIS — J438 Other emphysema: Secondary | ICD-10-CM

## 2012-01-13 MED ORDER — METOPROLOL TARTRATE 25 MG PO TABS: ORAL_TABLET | ORAL | Status: DC

## 2012-01-13 NOTE — Assessment & Plan Note (Signed)
Stable tolerating Ranexa 500 bid.  Films reviewed with Dr Excell Seltzer, [place] and [place].  Medical Rx only option

## 2012-01-13 NOTE — Assessment & Plan Note (Signed)
Cholesterol is at goal.  Continue current dose of statin and diet Rx.  No myalgias or side effects.  F/U  LFT's in 6 months. Lab Results  Component Value Date   LDLCALC  Value: 71        Total Cholesterol/HDL:CHD Risk Coronary Heart Disease Risk Table                     Men   Women  1/2 Average Risk   3.4   3.3  Average Risk       5.0   4.4  2 X Average Risk   9.6   7.1  3 X Average Risk  23.4   11.0        Use the calculated Patient Ratio above and the CHD Risk Table to determine the patient's CHD Risk.        ATP III CLASSIFICATION (LDL):  <100     mg/dL   Optimal  100-129  mg/dL   Near or Above                    Optimal  130-159  mg/dL   Borderline  160-189  mg/dL   High  >190     mg/dL   Very High 08/21/2010             

## 2012-01-13 NOTE — Patient Instructions (Signed)
Your physician recommends that you schedule a follow-up appointment in:  3 MONTHS WITH  DR NISHAN  Your physician recommends that you continue on your current medications as directed. Please refer to the Current Medication list given to you today.  

## 2012-01-13 NOTE — Progress Notes (Signed)
Patient ID: Levi [name], male   DOB: April 16, 1929, 78 y.o.   MRN: [id number] Elderly male with recent cath by Dr Gabriel Carina 10/07/11  . Myovue 1/30 prior to cath with small amount of ischemia  Overall Impression: Abnormal stress nuclear study. There is a small, mild to moderate in intensity apical inferior perfusion defect. This was not seen on the prior study. This may represent ischemia. EF normal with septal hypokinesis.  Cath Results:  Impression:  1. Severe triple vessel CAD s/p 4 V CABG with only 1 patent bypass graft. Lima to D1 patent  2. Severe disease in native LAD and Circumflex.  3. Preserved LV systolic function.  Recommendations: He has complex disease in his LAD that begins at the ostium and extends throughout the entire vessel. The LIMA has been used for the diagonal bypass. PCI of the LAD would not be beneficial. He also has disease in his Circumflex involving the OM. The distal LM has 70% narrowing. This makes PCI more difficult. Will attempt medical management for now. Will d/w Dr. Eden Emms. If we cannot manage his symptoms with medical management, may have to consider PCI in the future.  Has had some dyspnea: Right heart done at time of cath with reasonable PCWP and no pulmonary hypertension  Hemodynamic Findings:  Ao: 154/74  LV: 157/15/17  RA: 8  RV: 34/6/9  PA: 37/11 (mean 21)  PCWP: 20  Fick Cardiac Output: 4.9 L/min  Fick Cardiac Index: 2.1 L/min/m2  Central Aortic Saturation: 96%  Pulmonary Artery Saturation: 66%k   Complains of cost of crestor and Ranexa. Has significant back pain on right with previous history of pinched nerve Pain starts in lower back and radiates  To inner thigh on right  ROS: Denies fever, malais, weight loss, blurry vision, decreased visual acuity, cough, sputum, SOB, hemoptysis, pleuritic pain, palpitaitons, heartburn, abdominal pain, melena, lower extremity edema, claudication, or rash.  All other systems reviewed and negative  General: Affect  appropriate Healthy:  appears stated age HEENT: normal Neck supple with no adenopathy JVP normal no bruits no thyromegaly Lungs clear with no wheezing and good diaphragmatic motion Heart:  S1/S2 no murmur, no rub, gallop or click PMI normal Abdomen: benighn, BS positve, no tenderness, no AAA no bruit.  No HSM or HJR Distal pulses intact with no bruits No edema Neuro non-focal Skin warm and dry No muscular weakness   Current Outpatient Prescriptions  Medication Sig Dispense Refill  . aspirin 81 MG tablet Take 81 mg by mouth daily.        Marland Kitchen atorvastatin (LIPITOR) 40 MG tablet Take 1 tablet (40 mg total) by mouth daily.  90 tablet  3  . hydrochlorothiazide (HYDRODIURIL) 25 MG tablet Take 1 tablet (25 mg total) by mouth daily.  90 tablet  3  . losartan (COZAAR) 50 MG tablet Take 1 tablet (50 mg total) by mouth daily.  90 tablet  3  . metoprolol tartrate (LOPRESSOR) 25 MG tablet Take 0.5 tablets (12.5 mg total) by mouth 2 (two) times daily. 1/2 tab po bid  90 tablet  4  . nitroGLYCERIN (NITROSTAT) 0.4 MG SL tablet Place 1 tablet (0.4 mg total) under the tongue every 5 (five) minutes as needed for chest pain.  25 tablet  4  . ranolazine (RANEXA) 500 MG 12 hr tablet Take 1 tablet (500 mg total) by mouth 2 (two) times daily.  60 tablet  11    Allergies  Review of patient's allergies indicates no known allergies.  Electrocardiogram:  Assessment and Plan

## 2012-01-13 NOTE — Assessment & Plan Note (Signed)
Hard to know how much of dyspnea is deconditioning, COPD or anginal equivalent.  [employer]adic[employer] forms filled out for parking Continue current meds

## 2012-01-13 NOTE — Assessment & Plan Note (Signed)
Well controlled.  Continue current medications and low sodium Dash type diet.    

## 2012-01-17 ENCOUNTER — Telehealth: Payer: Self-pay | Admitting: Cardiovascular Disease

## 2012-01-17 MED ORDER — METOPROLOL TARTRATE 25 MG PO TABS: ORAL_TABLET | ORAL | Status: DC

## 2012-01-17 NOTE — Telephone Encounter (Signed)
New Problem:     Patient's wife called in needing a refill of her husband's metoprolol tartrate (LOPRESSOR) 25 MG tablet with [facility] [phone].  Please call when you have placed the order.

## 2012-02-14 ENCOUNTER — Other Ambulatory Visit: Payer: Self-pay | Admitting: Dermatology

## 2012-02-17 ENCOUNTER — Telehealth: Payer: Self-pay | Admitting: Cardiovascular Disease

## 2012-02-17 NOTE — Telephone Encounter (Signed)
New problem:  Patient wife calling, need something gentric -crestor  due to cost.

## 2012-02-17 NOTE — Telephone Encounter (Signed)
Patient's wife concern of the cost of Crestor. Pt and wife made aware that crestor was discontinued on 10/24/11 and started on Atorvastatin 40 mg tablet daily. Patient verbalized understanding.

## 2012-03-09 ENCOUNTER — Telehealth: Payer: Self-pay | Admitting: Cardiovascular Disease

## 2012-03-09 NOTE — Telephone Encounter (Signed)
PER WIFE  RENEXA WILL COST  $129.00  FOR 3 MONTHS WOULD LIKE DIFFER MED  THAT'S CHEAPER  IN MEANTIME HAVE LEFT 2 WEEKS WORTH OF SAMPLES AT FRONT DESK FOR PICK UP   WOULD LIKE NEW SCRIPT SENT TO [facility] WILL FORWARD TO DR Eden Emms FOR REVIEW./CY

## 2012-03-09 NOTE — Telephone Encounter (Signed)
Ranexa is unique there is no other med like it

## 2012-03-09 NOTE — Telephone Encounter (Signed)
Please return call to Brynn Reznik  [phone] regarding patient medication.

## 2012-03-11 MED ORDER — METOPROLOL TARTRATE 25 MG PO TABS: ORAL_TABLET | ORAL | Status: DC

## 2012-03-11 NOTE — Telephone Encounter (Signed)
PER PT'S WIFE CANNOT AFFORD  RENEXA NEEDS CHEAPER  MED .Zack Seal

## 2012-03-11 NOTE — Telephone Encounter (Signed)
F/u   Patient wife calling back to speak with nurse.

## 2012-03-16 NOTE — Telephone Encounter (Signed)
F/U   Patient wife calling to check on the status of medication changed due to cost.

## 2012-03-16 NOTE — Telephone Encounter (Signed)
Patient's wife called today to check on the status of Ranexa 500 mg  medication changed, due to cost. Patient states has enough medication for one week. A two weeks supply placed at the front desk. Patient's  aware.

## 2012-03-17 NOTE — Telephone Encounter (Signed)
I have answered this twice already there is no substiute for Ranexa it is a unique med

## 2012-03-17 NOTE — Telephone Encounter (Signed)
NEEDS DIFFER MED THAN RENEXA  TO EXPENSIVE./CY

## 2012-03-18 NOTE — Telephone Encounter (Signed)
PT'S WIFE AWARE  THAT THERE IS NO SUBSTITUTE  PT FEELS LIKE HES NOT ANY BETTER SINCE STARTING MED  HAS APPT MID AUGUST WILL DISCUSS AT THAT TIME .Levi [name]

## 2012-03-26 ENCOUNTER — Other Ambulatory Visit: Payer: Self-pay | Admitting: Dermatology

## 2012-04-06 ENCOUNTER — Telehealth: Payer: Self-pay | Admitting: Cardiovascular Disease

## 2012-04-06 NOTE — Telephone Encounter (Signed)
New msg Pt wants to know if he should continue ranexa. Please call

## 2012-04-06 NOTE — Telephone Encounter (Signed)
PT AWARE NEEDS TO CONT MED  NO CHANGES WERE MADE  PER LAST  PHONE NOTE PT TO DISCUSS AT NEXT VISIT  PER WIFE PT  OUT OF MED  SAMPLES LEFT AT FRONT DESK  FOR PICK UP   RENEXA 500 MG  NUMBER 32  LOT [id number]./CY

## 2012-04-17 ENCOUNTER — Encounter: Payer: Self-pay | Admitting: Cardiovascular Disease

## 2012-04-17 ENCOUNTER — Ambulatory Visit (INDEPENDENT_AMBULATORY_CARE_PROVIDER_SITE_OTHER): Payer: Medicare Other | Admitting: Cardiovascular Disease

## 2012-04-17 VITALS — BP 142/72 | HR 53 | Resp 17 | Ht 70.0 in | Wt 266.0 lb

## 2012-04-17 DIAGNOSIS — E785 Hyperlipidemia, unspecified: Secondary | ICD-10-CM

## 2012-04-17 DIAGNOSIS — I2510 Atherosclerotic heart disease of native coronary artery without angina pectoris: Secondary | ICD-10-CM

## 2012-04-17 DIAGNOSIS — I10 Essential (primary) hypertension: Secondary | ICD-10-CM

## 2012-04-17 MED ORDER — PRAVASTATIN SODIUM 20 MG PO TABS: 20.0000 mg | ORAL_TABLET | Freq: Every day | ORAL | Status: DC

## 2012-04-17 NOTE — Assessment & Plan Note (Signed)
Called in generic pravachol  Labs in 6 months

## 2012-04-17 NOTE — Progress Notes (Signed)
Patient ID: Levi [name], male   DOB: May 15, 1929, 78 y.o.   MRN: [id number] Elderly male with recent cath by Dr Gabriel Carina 10/07/11 . Myovue 1/30 prior to cath with small amount of ischemia  Overall Impression: Abnormal stress nuclear study. There is a small, mild to moderate in intensity apical inferior perfusion defect. This was not seen on the prior study. This may represent ischemia. EF normal with septal hypokinesis.  Cath Results:  Impression:  1. Severe triple vessel CAD s/p 4 V CABG with only 1 patent bypass graft. Lima to D1 patent  2. Severe disease in native LAD and Circumflex.  3. Preserved LV systolic function.  Recommendations: He has complex disease in his LAD that begins at the ostium and extends throughout the entire vessel. The LIMA has been used for the diagonal bypass. PCI of the LAD would not be beneficial. He also has disease in his Circumflex involving the OM. The distal LM has 70% narrowing. This makes PCI more difficult. Will attempt medical management for now. Will d/w Dr. Eden Emms. If we cannot manage his symptoms with medical management, may have to consider PCI in the future.  Has had some dyspnea: Right heart done at time of cath with reasonable PCWP and no pulmonary hypertension  Hemodynamic Findings:  Ao: 154/74  LV: 157/15/17  RA: 8  RV: 34/6/9  PA: 37/11 (mean 21)  PCWP: 20  Fick Cardiac Output: 4.9 L/min  Fick Cardiac Index: 2.1 L/min/m2  Central Aortic Saturation: 96%  Pulmonary Artery Saturation: 66%k   Complains of cost of crestor and Ranexa. Stopped crestor and getting samples of ranexa at office.   Has significant back pain on right with previous history of pinched nerve Pain starts in lower back and radiates  To inner thigh on right  ROS: Denies fever, malais, weight loss, blurry vision, decreased visual acuity, cough, sputum, SOB, hemoptysis, pleuritic pain, palpitaitons, heartburn, abdominal pain, melena, lower extremity edema, claudication, or rash.   All other systems reviewed and negative  General: Affect appropriate Obese white male HEENT: normal Neck supple with no adenopathy JVP normal no bruits no thyromegaly Lungs clear with no wheezing and good diaphragmatic motion Heart:  S1/S2 no murmur, no rub, gallop or click PMI normal Abdomen: benighn, BS positve, no tenderness, no AAA no bruit.  No HSM or HJR Distal pulses intact with no bruits No edema Neuro non-focal Skin warm and dry  Basal cell over left ear No muscular weakness   Current Outpatient Prescriptions  Medication Sig Dispense Refill  . aspirin 81 MG tablet Take 81 mg by mouth daily.        . hydrochlorothiazide (HYDRODIURIL) 25 MG tablet Take 1 tablet (25 mg total) by mouth daily.  90 tablet  3  . metoprolol tartrate (LOPRESSOR) 25 MG tablet 1/2 tab po bid  90 tablet  4  . ranolazine (RANEXA) 500 MG 12 hr tablet Take 1 tablet (500 mg total) by mouth 2 (two) times daily.  60 tablet  11    Allergies  Review of patient's allergies indicates no known allergies.  Electrocardiogram:  Assessment and Plan

## 2012-04-17 NOTE — Assessment & Plan Note (Signed)
Well controlled.  Continue current medications and low sodium Dash type diet.    

## 2012-04-17 NOTE — Patient Instructions (Signed)
Your physician wants you to follow-up in:   6 MONTHS WITH DR Haywood Filler will receive a reminder letter in the mail two months in advance. If you don't receive a letter, please call our office to schedule the follow-up appointment. Your physician has recommended you make the following change in your medication: START PRAVASTATIN  20 MG EVERY DAY

## 2012-04-17 NOTE — Assessment & Plan Note (Signed)
Stable with no angina and good activity level.  Continue medical Rx  Samples of Ranexa given

## 2012-05-13 ENCOUNTER — Telehealth: Payer: Self-pay | Admitting: Cardiovascular Disease

## 2012-05-13 NOTE — Telephone Encounter (Signed)
Walk In pt Form " Pt needs refills" placed in [facility] 05/13/12/KM

## 2012-05-14 ENCOUNTER — Telehealth: Payer: Self-pay | Admitting: Cardiovascular Disease

## 2012-05-14 NOTE — Telephone Encounter (Signed)
New Problem:    Patient called in wanting a prescription for his Losartin sent in to his pharmacy.   Please call back if you have any questions.

## 2012-05-15 MED ORDER — LOSARTAN POTASSIUM 50 MG PO TABS: 50.0000 mg | ORAL_TABLET | Freq: Every day | ORAL | Status: DC

## 2012-05-15 NOTE — Telephone Encounter (Signed)
Seems as if losratan was taken off the list last visit called pt to verify if he i taken this med or not. Left voice mail

## 2012-06-15 DIAGNOSIS — J309 Allergic rhinitis, unspecified: Secondary | ICD-10-CM | POA: Insufficient documentation

## 2012-06-22 ENCOUNTER — Telehealth: Payer: Self-pay | Admitting: Cardiovascular Disease

## 2012-06-22 NOTE — Telephone Encounter (Signed)
New Problem:    Patient's wife called in wanting to know if there were any samples of ranolazine (RANEXA) 500 MG 12 hr tablet available for the patient to have.  Please call back.

## 2012-06-22 NOTE — Telephone Encounter (Signed)
Pt notified samples of Ranexa 500mg  4 weeks left at front desk for pick up.

## 2012-07-09 DIAGNOSIS — C61 Malignant neoplasm of prostate: Secondary | ICD-10-CM | POA: Insufficient documentation

## 2012-07-27 ENCOUNTER — Other Ambulatory Visit: Payer: Self-pay | Admitting: *Deleted

## 2012-07-27 ENCOUNTER — Telehealth: Payer: Self-pay | Admitting: Cardiovascular Disease

## 2012-07-27 MED ORDER — RANOLAZINE ER 500 MG PO TB12: 500.0000 mg | ORAL_TABLET | Freq: Two times a day (BID) | ORAL | Status: DC

## 2012-07-27 NOTE — Telephone Encounter (Signed)
New problem    Returning call back to nurse.   

## 2012-07-27 NOTE — Telephone Encounter (Signed)
SENT  1 MO SUPPLY TO  [facility]  AWAITING  REPS RETURN CALL ./CY

## 2012-07-27 NOTE — Telephone Encounter (Signed)
PT NEEDS RANEXA SAMPLES  CHECKED SAMPLE ROOM NONE AVAILABLE CALL OUT TO REP   LEFT MESSAGE NEEDING  SAMPLES AWAITING RETURN PHONE CALL ./CY

## 2012-07-27 NOTE — Telephone Encounter (Signed)
lmtcb ./cy 

## 2012-07-28 NOTE — Telephone Encounter (Signed)
[facility] REP LEFT RANEXA SAMPLES  PT'

## 2012-08-03 NOTE — Telephone Encounter (Signed)
SAMPLES LEFT AT FRONT DESK FOR PT TO PICK UP./CY

## 2012-08-29 ENCOUNTER — Encounter (HOSPITAL_COMMUNITY): Payer: Self-pay

## 2012-08-29 ENCOUNTER — Emergency Department (HOSPITAL_COMMUNITY): Payer: Medicare Other

## 2012-08-29 ENCOUNTER — Emergency Department (HOSPITAL_COMMUNITY)
Admission: EM | Admit: 2012-08-29 | Discharge: 2012-08-29 | Disposition: A | Discharge status: Home / Self Care | Payer: Medicare Other | Attending: Emergency Medicine | Admitting: Emergency Medicine

## 2012-08-29 DIAGNOSIS — Z87891 Personal history of nicotine dependence: Secondary | ICD-10-CM | POA: Insufficient documentation

## 2012-08-29 DIAGNOSIS — Z7982 Long term (current) use of aspirin: Secondary | ICD-10-CM | POA: Insufficient documentation

## 2012-08-29 DIAGNOSIS — I2510 Atherosclerotic heart disease of native coronary artery without angina pectoris: Secondary | ICD-10-CM | POA: Insufficient documentation

## 2012-08-29 DIAGNOSIS — R110 Nausea: Secondary | ICD-10-CM

## 2012-08-29 DIAGNOSIS — Z79899 Other long term (current) drug therapy: Secondary | ICD-10-CM | POA: Diagnosis not present

## 2012-08-29 DIAGNOSIS — R61 Generalized hyperhidrosis: Secondary | ICD-10-CM | POA: Diagnosis not present

## 2012-08-29 DIAGNOSIS — E785 Hyperlipidemia, unspecified: Secondary | ICD-10-CM | POA: Insufficient documentation

## 2012-08-29 DIAGNOSIS — Z8709 Personal history of other diseases of the respiratory system: Secondary | ICD-10-CM | POA: Insufficient documentation

## 2012-08-29 DIAGNOSIS — Z85038 Personal history of other malignant neoplasm of large intestine: Secondary | ICD-10-CM | POA: Insufficient documentation

## 2012-08-29 DIAGNOSIS — Z951 Presence of aortocoronary bypass graft: Secondary | ICD-10-CM | POA: Diagnosis not present

## 2012-08-29 DIAGNOSIS — E669 Obesity, unspecified: Secondary | ICD-10-CM | POA: Diagnosis not present

## 2012-08-29 DIAGNOSIS — I10 Essential (primary) hypertension: Secondary | ICD-10-CM | POA: Insufficient documentation

## 2012-08-29 DIAGNOSIS — R0602 Shortness of breath: Secondary | ICD-10-CM | POA: Insufficient documentation

## 2012-08-29 DIAGNOSIS — Z8546 Personal history of malignant neoplasm of prostate: Secondary | ICD-10-CM | POA: Diagnosis not present

## 2012-08-29 LAB — CBC
Hemoglobin: 14.5 g/dL (ref 13.0–17.0)
Platelets: 231 10*3/uL (ref 150–400)
RBC: 4.66 MIL/uL (ref 4.22–5.81)
WBC: 10.6 10*3/uL — ABNORMAL HIGH (ref 4.0–10.5)

## 2012-08-29 LAB — COMPREHENSIVE METABOLIC PANEL
ALT: 12 U/L (ref 0–53)
AST: 19 U/L (ref 0–37)
Alkaline Phosphatase: 80 U/L (ref 39–117)
CO2: 25 mEq/L (ref 19–32)
Chloride: 96 mEq/L (ref 96–112)
GFR calc non Af Amer: 50 mL/min — ABNORMAL LOW (ref >90)
Potassium: 3.3 mEq/L — ABNORMAL LOW (ref 3.5–5.1)
Sodium: 134 mEq/L — ABNORMAL LOW (ref 135–145)
Total Bilirubin: 0.5 mg/dL (ref 0.3–1.2)

## 2012-08-29 LAB — POCT I-STAT TROPONIN I

## 2012-08-29 LAB — TROPONIN I: Troponin I: <0.3 ng/mL (ref <0.30)

## 2012-08-29 NOTE — ED Provider Notes (Signed)
History     CSN: [id number]  Arrival date & time 08/29/12  1208   First MD Initiated Contact with Patient 08/29/12 1211      Chief Complaint  Patient presents with  . Shortness of Breath    (Consider location/radiation/quality/duration/timing/severity/associated sxs/prior treatment) The history is provided by the patient and medical records. No language interpreter was used.   78 year old male presents the emergency department with chief complaint of sudden onset nausea, diaphoresis, and shortness of breath.  The patient was riding his golf cart at the time that he began.  Patient has a past medical history significant for coronary artery disease.  He has had a quadruple bypass.  His cardiologist is Dr. Eden Emms. Patient denies chest pain or chest pressure.  He denies a history of congestive heart failure.  His son and wife are in the room and provides some of the history.  They state that he is chronically fatigued and has shortness of breath with it with exertion.  However they say he was "really struggling to breathe and gasping for air earlier."  The patient received nitroglycerin and aspirin on the ambulance ride.  He appears to be doing much better.  Patient denies a history of COPD or asthma. Eyes a recent viral illnesses or symptoms of upper respiratory infection.   Past Medical History  Diagnosis Date  . HTN (hypertension)   . HLD (hyperlipidemia)   . Edema   . Benign neoplasm of colon   . Dyspnea on exertion   . Prostate cancer   . Osteopenia   . Emphysema   . Bronchitis   . Obesity   . Malaise and fatigue   . CAD (coronary artery disease) Dec 2011    s/p CABG per Dr. Cornelius Moras; had normal EF    Past Surgical History  Procedure Date  . Median sternotomy 08/23/10  . Coronary artery bypass graft 08/23/10  . Cervical disc surgery     Family History  Problem Relation Age of Onset  . Heart disease Brother   . Skin cancer Brother   . Prostate cancer Brother   . Kidney  cancer Father   . Heart attack Father     History  Substance Use Topics  . Smoking status: Former Games developer  . Smokeless tobacco: Not on file  . Alcohol Use: No      Review of Systems Ten systems reviewed and are negative for acute change, except as noted in the HPI.   Allergies  Review of patient's allergies indicates no known allergies.  Home Medications   Current Outpatient Rx  Name  Route  Sig  Dispense  Refill  . IPRATROPIUM-ALBUTEROL 18-103 MCG/ACT IN AERO   Inhalation   Inhale 2 puffs into the lungs every 6 (six) hours as needed. For wheezing         . ASPIRIN 81 MG PO TABS   Oral   Take 81 mg by mouth at bedtime.          Marland Kitchen HYDROCHLOROTHIAZIDE 25 MG PO TABS   Oral   Take 25 mg by mouth every morning.         Marland Kitchen LOSARTAN POTASSIUM 50 MG PO TABS   Oral   Take 50 mg by mouth at bedtime.         Marland Kitchen METOPROLOL TARTRATE 25 MG PO TABS   Oral   Take 12.5 mg by mouth 2 (two) times daily.         Marland Kitchen PRAVASTATIN SODIUM 20  MG PO TABS   Oral   Take 20 mg by mouth daily.         Marland Kitchen RANOLAZINE ER 500 MG PO TB12   Oral   Take 1 tablet (500 mg total) by mouth 2 (two) times daily.   60 tablet   1     BP 138/64  Pulse 57  Temp 97.6 F (36.4 C) (Oral)  Resp 20  SpO2 100%  Physical Exam  Nursing note and vitals reviewed. Constitutional: He is oriented to person, place, and time. He appears well-developed and well-nourished. No distress.  HENT:  Head: Normocephalic and atraumatic.  Eyes: Conjunctivae normal are normal. No scleral icterus.  Neck: Normal range of motion. Neck supple.  Cardiovascular: Normal rate, regular rhythm and normal heart sounds.   Pulmonary/Chest: Breath sounds normal. No respiratory distress. He has no wheezes. He has no rales. He exhibits no tenderness.       Increased effort  Abdominal: Soft. There is no tenderness.  Musculoskeletal: Normal range of motion. He exhibits no edema and no tenderness.  Neurological: He is alert and  oriented to person, place, and time. No cranial nerve deficit.  Skin: Skin is warm and dry. He is not diaphoretic.  Psychiatric: His behavior is normal.    ED Course  Procedures (including critical care time)  Labs Reviewed  CBC - Abnormal; Notable for the following:    WBC 10.6 (*)     All other components within normal limits  PRO B NATRIURETIC PEPTIDE  COMPREHENSIVE METABOLIC PANEL    Date: 08/29/2012  Rate: 56  Rhythm: normal sinus rhythm  QRS Axis: right  Intervals: normal  ST/T Wave abnormalities: nonspecific T wave changes  Conduction Disutrbances:first-degree A-V block  and nonspecific intraventricular conduction delay  Narrative Interpretation:   Old EKG Reviewed: unchanged     No results found.   No diagnosis found.    MDM  1:33 PM Filed Vitals:   08/29/12 1215  BP: 138/64  Pulse: 57  Temp: 97.6 F (36.4 C)  Resp: 20  patient with sig, risk factors for ACS.  Initial ekg and troponin negative.  I have spoken with Dr. Johney Frame  Who is oncall for  [facility] cardiology. He asks that we get second troponin in 4 hours and if negative, patient may f/u in the office this week  6:15 PM Patient's second troponin negative.  He has had no return of sxs. Patient will be discharged with f/u at [facility] cardiology this week.  Office staff is aware and will contact him Monday morning.  At this time there does not appear to be any evidence of an acute emergency medical condition and the patient appears stable for discharge with appropriate outpatient follow up.Diagnosis was discussed with patient who verbalizes understanding and is agreeable to discharge. Pt case discussed with Dr. Blinda Leatherwood who agrees with my plan.         Arthor Captain, PA-C 09/02/12 1753

## 2012-08-29 NOTE — ED Notes (Signed)
Pt was driving a golf cart and sudden onset of SOB, diaphoresis, and nausea.  Pt denies pain, denies vomiting.  Pt took 1 SL Nitro prior to EMS arrival.  Pt with quadruple bypass in 2011, family reports on 1 "took".

## 2012-08-31 ENCOUNTER — Inpatient Hospital Stay (HOSPITAL_COMMUNITY)
Admission: AD | Admit: 2012-08-31 | Discharge: 2012-09-02 | DRG: 287 | Disposition: A | Discharge status: Home / Self Care | Payer: Medicare Other | Source: Ambulatory Visit | Attending: Internal Medicine | Admitting: Internal Medicine

## 2012-08-31 ENCOUNTER — Ambulatory Visit (INDEPENDENT_AMBULATORY_CARE_PROVIDER_SITE_OTHER): Payer: Medicare Other | Admitting: Internal Medicine

## 2012-08-31 ENCOUNTER — Encounter: Payer: Self-pay | Admitting: Internal Medicine

## 2012-08-31 ENCOUNTER — Telehealth: Payer: Self-pay | Admitting: Cardiovascular Disease

## 2012-08-31 ENCOUNTER — Encounter (HOSPITAL_COMMUNITY): Payer: Self-pay | Admitting: *Deleted

## 2012-08-31 VITALS — BP 130/76 | HR 55 | Ht 70.0 in | Wt 267.0 lb

## 2012-08-31 DIAGNOSIS — I200 Unstable angina: Secondary | ICD-10-CM

## 2012-08-31 DIAGNOSIS — I25810 Atherosclerosis of coronary artery bypass graft(s) without angina pectoris: Secondary | ICD-10-CM | POA: Diagnosis present

## 2012-08-31 DIAGNOSIS — R5383 Other fatigue: Secondary | ICD-10-CM

## 2012-08-31 DIAGNOSIS — M549 Dorsalgia, unspecified: Secondary | ICD-10-CM

## 2012-08-31 DIAGNOSIS — J438 Other emphysema: Secondary | ICD-10-CM

## 2012-08-31 DIAGNOSIS — R0989 Other specified symptoms and signs involving the circulatory and respiratory systems: Secondary | ICD-10-CM

## 2012-08-31 DIAGNOSIS — Z87891 Personal history of nicotine dependence: Secondary | ICD-10-CM

## 2012-08-31 DIAGNOSIS — I2510 Atherosclerotic heart disease of native coronary artery without angina pectoris: Principal | ICD-10-CM

## 2012-08-31 DIAGNOSIS — M899 Disorder of bone, unspecified: Secondary | ICD-10-CM

## 2012-08-31 DIAGNOSIS — D126 Benign neoplasm of colon, unspecified: Secondary | ICD-10-CM

## 2012-08-31 DIAGNOSIS — I4891 Unspecified atrial fibrillation: Secondary | ICD-10-CM

## 2012-08-31 DIAGNOSIS — E669 Obesity, unspecified: Secondary | ICD-10-CM

## 2012-08-31 DIAGNOSIS — E785 Hyperlipidemia, unspecified: Secondary | ICD-10-CM

## 2012-08-31 DIAGNOSIS — J40 Bronchitis, not specified as acute or chronic: Secondary | ICD-10-CM

## 2012-08-31 DIAGNOSIS — Z860100 Personal history of colon polyps, unspecified: Secondary | ICD-10-CM

## 2012-08-31 DIAGNOSIS — I10 Essential (primary) hypertension: Secondary | ICD-10-CM

## 2012-08-31 DIAGNOSIS — R079 Chest pain, unspecified: Secondary | ICD-10-CM

## 2012-08-31 DIAGNOSIS — Z6838 Body mass index (BMI) 38.0-38.9, adult: Secondary | ICD-10-CM

## 2012-08-31 DIAGNOSIS — R071 Chest pain on breathing: Secondary | ICD-10-CM

## 2012-08-31 DIAGNOSIS — C61 Malignant neoplasm of prostate: Secondary | ICD-10-CM

## 2012-08-31 DIAGNOSIS — M949 Disorder of cartilage, unspecified: Secondary | ICD-10-CM

## 2012-08-31 DIAGNOSIS — R609 Edema, unspecified: Secondary | ICD-10-CM

## 2012-08-31 DIAGNOSIS — R5381 Other malaise: Secondary | ICD-10-CM

## 2012-08-31 LAB — COMPREHENSIVE METABOLIC PANEL
ALT: 12 U/L (ref 0–53)
Alkaline Phosphatase: 89 U/L (ref 39–117)
CO2: 30 mEq/L (ref 19–32)
Chloride: 99 mEq/L (ref 96–112)
GFR calc Af Amer: 52 mL/min — ABNORMAL LOW (ref >90)
GFR calc non Af Amer: 45 mL/min — ABNORMAL LOW (ref >90)
Glucose, Bld: 94 mg/dL (ref 70–99)
Potassium: 4.3 mEq/L (ref 3.5–5.1)
Sodium: 140 mEq/L (ref 135–145)
Total Bilirubin: 0.3 mg/dL (ref 0.3–1.2)

## 2012-08-31 LAB — CBC WITH DIFFERENTIAL/PLATELET
Basophils Absolute: 0 10*3/uL (ref 0.0–0.1)
Basophils Relative: 0 % (ref 0–1)
Eosinophils Relative: 4 % (ref 0–5)
HCT: 40.3 % (ref 39.0–52.0)
MCHC: 33 g/dL (ref 30.0–36.0)
Monocytes Absolute: 0.8 10*3/uL (ref 0.1–1.0)
Neutro Abs: 4.9 10*3/uL (ref 1.7–7.7)
RDW: 12.9 % (ref 11.5–15.5)

## 2012-08-31 MED ORDER — IPRATROPIUM-ALBUTEROL 18-103 MCG/ACT IN AERO
2.0000 | INHALATION_SPRAY | Freq: Four times a day (QID) | RESPIRATORY_TRACT | Status: DC | PRN
  Filled 2012-08-31: qty 14.7

## 2012-08-31 MED ORDER — ASPIRIN 300 MG RE SUPP
300.0000 mg | RECTAL | Status: AC
  Filled 2012-08-31: qty 1

## 2012-08-31 MED ORDER — NITROGLYCERIN 0.4 MG SL SUBL: 0.4000 mg | SUBLINGUAL_TABLET | SUBLINGUAL | Status: DC | PRN

## 2012-08-31 MED ORDER — LOSARTAN POTASSIUM 50 MG PO TABS
50.0000 mg | ORAL_TABLET | Freq: Every day | ORAL | Status: DC
  Administered 2012-08-31 – 2012-09-01 (×2): 50 mg via ORAL
  Filled 2012-08-31 (×3): qty 1

## 2012-08-31 MED ORDER — HEPARIN BOLUS VIA INFUSION
4000.0000 [IU] | Freq: Once | INTRAVENOUS | Status: AC
  Administered 2012-08-31: 4000 [IU] via INTRAVENOUS
  Filled 2012-08-31: qty 4000

## 2012-08-31 MED ORDER — ASPIRIN 81 MG PO CHEW
324.0000 mg | CHEWABLE_TABLET | ORAL | Status: AC
  Administered 2012-08-31: 324 mg via ORAL
  Filled 2012-08-31: qty 4

## 2012-08-31 MED ORDER — RANOLAZINE ER 500 MG PO TB12
500.0000 mg | ORAL_TABLET | Freq: Two times a day (BID) | ORAL | Status: DC
  Administered 2012-08-31 – 2012-09-02 (×4): 500 mg via ORAL
  Filled 2012-08-31 (×5): qty 1

## 2012-08-31 MED ORDER — ONDANSETRON HCL 4 MG/2ML IJ SOLN: 4.0000 mg | Freq: Four times a day (QID) | INTRAMUSCULAR | Status: DC | PRN

## 2012-08-31 MED ORDER — METOPROLOL TARTRATE 12.5 MG HALF TABLET
12.5000 mg | ORAL_TABLET | Freq: Two times a day (BID) | ORAL | Status: DC
  Administered 2012-08-31: 12.5 mg via ORAL
  Filled 2012-08-31 (×3): qty 1

## 2012-08-31 MED ORDER — ASPIRIN EC 81 MG PO TBEC
81.0000 mg | DELAYED_RELEASE_TABLET | Freq: Every day | ORAL | Status: DC
  Administered 2012-08-31 – 2012-09-01 (×2): 81 mg via ORAL
  Filled 2012-08-31 (×3): qty 1

## 2012-08-31 MED ORDER — ASPIRIN 81 MG PO TABS: 81.0000 mg | ORAL_TABLET | Freq: Every day | ORAL | Status: DC

## 2012-08-31 MED ORDER — ASPIRIN EC 81 MG PO TBEC
81.0000 mg | DELAYED_RELEASE_TABLET | Freq: Every day | ORAL | Status: DC
  Administered 2012-09-01 – 2012-09-02 (×2): 81 mg via ORAL
  Filled 2012-08-31 (×2): qty 1

## 2012-08-31 MED ORDER — ACETAMINOPHEN 325 MG PO TABS: 650.0000 mg | ORAL_TABLET | ORAL | Status: DC | PRN

## 2012-08-31 MED ORDER — HYDROCHLOROTHIAZIDE 25 MG PO TABS
25.0000 mg | ORAL_TABLET | Freq: Every morning | ORAL | Status: DC
  Administered 2012-09-01 – 2012-09-02 (×2): 25 mg via ORAL
  Filled 2012-08-31 (×2): qty 1

## 2012-08-31 MED ORDER — HEPARIN (PORCINE) IN NACL 100-0.45 UNIT/ML-% IJ SOLN
1500.0000 [IU]/h | INTRAMUSCULAR | Status: DC
  Administered 2012-08-31: 1500 [IU]/h via INTRAVENOUS
  Filled 2012-08-31 (×3): qty 250

## 2012-08-31 MED ORDER — SIMVASTATIN 20 MG PO TABS
20.0000 mg | ORAL_TABLET | Freq: Every day | ORAL | Status: DC
  Administered 2012-08-31 – 2012-09-01 (×2): 20 mg via ORAL
  Filled 2012-08-31 (×3): qty 1

## 2012-08-31 MED ORDER — ALPRAZOLAM 0.25 MG PO TABS: 0.2500 mg | ORAL_TABLET | Freq: Two times a day (BID) | ORAL | Status: DC | PRN

## 2012-08-31 NOTE — Telephone Encounter (Signed)
Pt needs renexa samples, pls call

## 2012-08-31 NOTE — Progress Notes (Signed)
See history and physical in notes section.  Patient admitted to [facility].

## 2012-08-31 NOTE — Telephone Encounter (Signed)
Patient called spoke to daughter n law was told spoke to DOD Dr.Ross she will see patient today at 2:00 pm.Advised to go to ER if needed.

## 2012-08-31 NOTE — Progress Notes (Signed)
ANTICOAGULATION CONSULT NOTE - Initial Consult  Pharmacy Consult for heparin Indication: chest pain/ACS  No Known Allergies  Patient Measurements: Height: 5\' 10"  (177.8 cm) Weight: 263 lb 0.1 oz (119.3 kg) IBW/kg (Calculated) : 73  Heparin Dosing Weight: 100  Vital Signs: Temp: 97.4 F (36.3 C) (12/30 1611) Temp src: Oral (12/30 1611) BP: 148/62 mmHg (12/30 1611) Pulse Rate: 52  (12/30 1611)  Labs:  Basename 08/29/12 1457 08/29/12 1224  HGB -- 14.5  HCT -- 41.4  PLT -- 231  APTT -- --  LABPROT -- --  INR -- --  HEPARINUNFRC -- --  CREATININE -- 1.29  CKTOTAL -- --  CKMB -- --  TROPONINI <0.30 --    Estimated Creatinine Clearance: 58.1 ml/min (by C-G formula based on Cr of 1.29).   Medical History: Past Medical History  Diagnosis Date  . HTN (hypertension)   . HLD (hyperlipidemia)   . Edema   . Benign neoplasm of colon   . Dyspnea on exertion   . Prostate cancer   . Osteopenia   . Emphysema   . Bronchitis   . Obesity   . Malaise and fatigue   . CAD (coronary artery disease) Dec 2011    s/p CABG per Dr. Cornelius Moras; had normal EF    Medications:  Prescriptions prior to admission  Medication Sig Dispense Refill  . albuterol-ipratropium (COMBIVENT) 18-103 MCG/ACT inhaler Inhale 2 puffs into the lungs every 6 (six) hours as needed. For wheezing      . aspirin 81 MG tablet Take 81 mg by mouth at bedtime.       . hydrochlorothiazide (HYDRODIURIL) 25 MG tablet Take 25 mg by mouth every morning.      Marland Kitchen losartan (COZAAR) 50 MG tablet Take 50 mg by mouth at bedtime.      . metoprolol tartrate (LOPRESSOR) 25 MG tablet Take 12.5 mg by mouth 2 (two) times daily.      . pravastatin (PRAVACHOL) 20 MG tablet Take 20 mg by mouth daily.      . ranolazine (RANEXA) 500 MG 12 hr tablet Take 1 tablet (500 mg total) by mouth 2 (two) times daily.  60 tablet  1   Scheduled:    . aspirin  324 mg Oral NOW   Or  . aspirin  300 mg Rectal NOW  . aspirin EC  81 mg Oral Daily  .  aspirin EC  81 mg Oral QHS  . hydrochlorothiazide  25 mg Oral q morning - 10a  . losartan  50 mg Oral QHS  . metoprolol tartrate  12.5 mg Oral BID  . ranolazine  500 mg Oral BID  . simvastatin  20 mg Oral q1800  . [DISCONTINUED] aspirin  81 mg Oral QHS    Assessment: 78 yo with hx CAD who was admitted for CP. IV heparin has been ordered to r/o MI. He is not on any anticoag PTA.   Goal of Therapy:  Heparin level 0.3-0.7 units/ml Monitor platelets by anticoagulation protocol: Yes   Plan:  Heparin bolus 4000 units x1 Heparin drip at 1500 units/hr F/u with 8 hr heparin level  Ulyses Southward [place] 08/31/2012,4:15 PM

## 2012-08-31 NOTE — H&P (Signed)
Patient ID: Levi [name] MRN: [id number], DOB/AGE: Apr 09, 1929   Admit date: 08/31/2012 Date of Consult: @TODAY @  Primary Physician: Aura Dials, MD Primary Cardiologist: P. Nishan    Problem List: Past Medical History  Diagnosis Date  . HTN (hypertension)   . HLD (hyperlipidemia)   . Edema   . Benign neoplasm of colon   . Dyspnea on exertion   . Prostate cancer   . Osteopenia   . Emphysema   . Bronchitis   . Obesity   . Malaise and fatigue   . CAD (coronary artery disease) Dec 2011    s/p CABG per Dr. Cornelius Moras; had normal EF    Past Surgical History  Procedure Date  . Median sternotomy 08/23/10  . Coronary artery bypass graft 08/23/10  . Cervical disc surgery      Allergies: No Known Allergies  HPI:  Patient is an 78 yo with a history of CAD  PResented today to clinic for evaluation of chest tighness, nausea, sweats.  The patient has a history of CAD  He was last seen in cardiology clinic in [place].  Prior to that he had a cardiac cath in Feb 2013 (after myoview showed small area of apical ischemia) Cardiac cath showed severe 3 V CAD  There was only 1 patent graft out of 4 (LIMA to D1).  Severe diseaseof native LAD and LCx.  70% distal LM.  PAD with diffuse disease.  Recomm was for increased medical therapy. The family and patient report that he has chronic dyspnea with exertion over the summer  On Saturday he was sitting in a golf cart when he broke out into a sweat.  Felt weak.  Had chest pressure.  Devel N/  Patient brought to [facility] ER and snet home.  SUnday he sat around house  No real activity.  Today got up.  Again developed tightness, diaphoresis, SOB  CLinic was called  The patient notes chronic SOB that he says is without signif change. Sleeps in bed and in recliner.   Inpatient Medications:    . aspirin  324 mg Oral NOW   Or  . aspirin  300 mg Rectal NOW  . aspirin EC  81 mg Oral Daily  . aspirin EC  81 mg Oral QHS  . heparin  4,000 Units  Intravenous Once  . hydrochlorothiazide  25 mg Oral q morning - 10a  . losartan  50 mg Oral QHS  . metoprolol tartrate  12.5 mg Oral BID  . ranolazine  500 mg Oral BID  . simvastatin  20 mg Oral q1800    Family History  Problem Relation Age of Onset  . Heart disease Brother   . Skin cancer Brother   . Prostate cancer Brother   . Kidney cancer Father   . Heart attack Father      History   Social History  . Marital Status: Married    Spouse Name: N/A    Number of Children: N/A  . Years of Education: N/A   Occupational History  . retired Chartered certified accountant    Social History Main Topics  . Smoking status: Former Games developer  . Smokeless tobacco: Not on file  . Alcohol Use: No  . Drug Use: No  . Sexually Active: Not on file   Other Topics Concern  . Not on file   Social History Narrative  . No narrative on file     Review of Systems: General: negative for chills, fever, night sweats or weight  changes.  Cardiovascular: negative for chest pain, dyspnea on exertion, edema, orthopnea, palpitations, paroxysmal nocturnal dyspnea or shortness of breath Dermatological: negative for rash Respiratory: negative for cough or wheezing Urologic: negative for hematuria Abdominal: negative for nausea, vomiting, diarrhea, bright red blood per rectum, melena, or hematemesis Neurologic: negative for visual changes, syncope, or dizziness All other systems reviewed and are otherwise negative except as noted above.  Physical Exam: Filed Vitals:   08/31/12 1611  BP: 148/62  Pulse: 52  Temp: 97.4 F (36.3 C)  Resp: 20   No intake or output data in the 24 hours ending 08/31/12 1742  General: Well developed, obese 78 yo, in no acute distress. Head: Normocephalic, atraumatic, sclera non-icteric Neck: Negative for carotid bruits. JVP not elevated. Lungs: Clear bilaterally to auscultation without wheezes, rales, or rhonchi. Breathing is unlabored. Heart: RRR with S1 S2. No murmurs, rubs, or  gallops appreciated. Abdomen: Soft, non-tender, non-distended with normoactive bowel sounds. No hepatomegaly. No rebound/guarding. No obvious abdominal masses. Msk:  Strength and tone appears normal for age. Extremities: No clubbing, cyanosis or edema.  Distal pedal pulses are 2+ and equal bilaterally. Neuro: Alert and oriented X 3. Moves all extremities spontaneously. Psych:  Responds to questions appropriately with a normal affect.  Labs: Results for orders placed during the hospital encounter of 08/31/12 (from the past 24 hour(s))  CBC WITH DIFFERENTIAL     Status: Normal   Collection Time   08/31/12  4:19 PM      Component Value Range   WBC 7.6  4.0 - 10.5 K/uL   RBC 4.49  4.22 - 5.81 MIL/uL   Hemoglobin 13.3  13.0 - 17.0 g/dL   HCT 16.1  09.6 - 04.5 %   MCV 89.8  78.0 - 100.0 fL   MCH 29.6  26.0 - 34.0 pg   MCHC 33.0  30.0 - 36.0 g/dL   RDW 40.9  81.1 - 91.4 %   Platelets 231  150 - 400 K/uL   Neutrophils Relative 64  43 - 77 %   Neutro Abs 4.9  1.7 - 7.7 K/uL   Lymphocytes Relative 22  12 - 46 %   Lymphs Abs 1.7  0.7 - 4.0 K/uL   Monocytes Relative 10  3 - 12 %   Monocytes Absolute 0.8  0.1 - 1.0 K/uL   Eosinophils Relative 4  0 - 5 %   Eosinophils Absolute 0.3  0.0 - 0.7 K/uL   Basophils Relative 0  0 - 1 %   Basophils Absolute 0.0  0.0 - 0.1 K/uL  PROTIME-INR     Status: Normal   Collection Time   08/31/12  4:19 PM      Component Value Range   Prothrombin Time 13.4  11.6 - 15.2 seconds   INR 1.03  0.00 - 1.49  APTT     Status: Normal   Collection Time   08/31/12  4:19 PM      Component Value Range   aPTT 34  24 - 37 seconds  COMPREHENSIVE METABOLIC PANEL     Status: Abnormal   Collection Time   08/31/12  4:19 PM      Component Value Range   Sodium 140  135 - 145 mEq/L   Potassium 4.3  3.5 - 5.1 mEq/L   Chloride 99  96 - 112 mEq/L   CO2 30  19 - 32 mEq/L   Glucose, Bld 94  70 - 99 mg/dL   BUN 26 (*) 6 -  23 mg/dL   Creatinine, Ser 1.61 (*) 0.50 - 1.35 mg/dL     Calcium 9.5  8.4 - 10.5 mg/dL   Total Protein 7.0  6.0 - 8.3 g/dL   Albumin 3.7  3.5 - 5.2 g/dL   AST 16  0 - 37 U/L   ALT 12  0 - 53 U/L   Alkaline Phosphatase 89  39 - 117 U/L   Total Bilirubin 0.3  0.3 - 1.2 mg/dL   GFR calc non Af Amer 45 (*) >90 mL/min   GFR calc Af Amer 52 (*) >90 mL/min  TROPONIN I     Status: Normal   Collection Time   08/31/12  4:20 PM      Component Value Range   Troponin I <0.30  <0.30 ng/mL    Radiology/Studies: Dg Chest 2 View  08/29/2012  *RADIOLOGY REPORT*  Clinical Data: Short of breath  CHEST - 2 VIEW  Comparison: Chest film 09/10/2010  Findings: Sternotomy wires overlie normal cardiac silhouette with ectatic aorta.  Mild basilar atelectasis unchanged from prior. There is mild bronchitic change centrally which is stable. Anterior cervical fusion noted.  No pleural fluid.  No focal consolidation.  No pneumothorax. Degenerative osteophytosis of the thoracic spine.  IMPRESSION:  1. No acute cardiopulmonary process. 2.  Low lung volumes and chronic bronchitic change.   Original Report Authenticated By: Genevive Bi, M.D.     EKG:  Sinus bradycardia  51 bpm.  RBBB. LAFB>    ASSESSMENT AND PLAN:  Patient is an 78 yo with severe CAD  I have reviewed films with C McAlhany who performed cath in Feb.   I am concerned by his current presentation.  Unclear if he has had signif change in his anatomy, possibly treatable by intervention.  I would recomm taht the patient be admitted to [facility].  Cycle enzymes.  Start heparin  Would reocmm L heart cath tomorro to define anatomy of native vessels and graft.  2.  HL  COntinue statin.  3.  HTN  Follow.  Signed, Dietrich Pates 08/31/2012, 5:42 PM

## 2012-08-31 NOTE — Telephone Encounter (Signed)
F/u   Pt daughter in law has called back regarding breathing issues, pt scheduled to see Nishan on 09/11/12.  Plz advise daughter at [phone].  Sent msg unable to get ahold of triage.

## 2012-08-31 NOTE — Telephone Encounter (Signed)
Spoke to patient's daughter n Social worker.She stated patient had a  episode this past Saturday sob,dizzy,clammy was taken to ER.States this morning patient walked out to his garage and had same symptoms dizzy,sob,clammy,no chest pain.States patient got like this when he had heart surgery.Daughter n law was told Dr.Nishan not in office today will check with DOD DR.Ross and call her back.

## 2012-09-01 ENCOUNTER — Ambulatory Visit (HOSPITAL_COMMUNITY): Admission: RE | Admit: 2012-09-01 | Payer: Medicare Other | Source: Ambulatory Visit | Admitting: Cardiology

## 2012-09-01 ENCOUNTER — Encounter (HOSPITAL_COMMUNITY)
Admission: AD | Disposition: A | Discharge status: Home / Self Care | Payer: Self-pay | Source: Ambulatory Visit | Attending: Internal Medicine

## 2012-09-01 DIAGNOSIS — I2510 Atherosclerotic heart disease of native coronary artery without angina pectoris: Secondary | ICD-10-CM

## 2012-09-01 HISTORY — PX: LEFT HEART CATHETERIZATION WITH CORONARY/GRAFT ANGIOGRAM: SHX5450

## 2012-09-01 LAB — BASIC METABOLIC PANEL
Calcium: 9 mg/dL (ref 8.4–10.5)
Creatinine, Ser: 1.23 mg/dL (ref 0.50–1.35)
GFR calc Af Amer: 62 mL/min — ABNORMAL LOW (ref >90)

## 2012-09-01 LAB — TROPONIN I: Troponin I: <0.3 ng/mL (ref <0.30)

## 2012-09-01 LAB — CREATININE, SERUM: GFR calc non Af Amer: 51 mL/min — ABNORMAL LOW (ref >90)

## 2012-09-01 LAB — CBC
MCH: 31.3 pg (ref 26.0–34.0)
MCHC: 35 g/dL (ref 30.0–36.0)
Platelets: 196 10*3/uL (ref 150–400)
Platelets: 172 10*3/uL (ref 150–400)
RBC: 4.48 MIL/uL (ref 4.22–5.81)
RDW: 12.7 % (ref 11.5–15.5)
RDW: 12.8 % (ref 11.5–15.5)
WBC: 8.4 10*3/uL (ref 4.0–10.5)

## 2012-09-01 LAB — POCT ACTIVATED CLOTTING TIME: Activated Clotting Time: 149 seconds

## 2012-09-01 SURGERY — LEFT HEART CATHETERIZATION WITH CORONARY/GRAFT ANGIOGRAM
Anesthesia: LOCAL

## 2012-09-01 MED ORDER — ACETAMINOPHEN 325 MG PO TABS: 650.0000 mg | ORAL_TABLET | ORAL | Status: DC | PRN

## 2012-09-01 MED ORDER — HEPARIN (PORCINE) IN NACL 2-0.9 UNIT/ML-% IJ SOLN
INTRAMUSCULAR | Status: AC
  Filled 2012-09-01: qty 1000

## 2012-09-01 MED ORDER — NITROGLYCERIN 0.2 MG/ML ON CALL CATH LAB
INTRAVENOUS | Status: AC
  Filled 2012-09-01: qty 1

## 2012-09-01 MED ORDER — HEPARIN SODIUM (PORCINE) 5000 UNIT/ML IJ SOLN
5000.0000 [IU] | Freq: Three times a day (TID) | INTRAMUSCULAR | Status: DC
  Administered 2012-09-01 – 2012-09-02 (×3): 5000 [IU] via SUBCUTANEOUS
  Filled 2012-09-01 (×6): qty 1

## 2012-09-01 MED ORDER — METOPROLOL TARTRATE 12.5 MG HALF TABLET
12.5000 mg | ORAL_TABLET | Freq: Two times a day (BID) | ORAL | Status: DC
  Filled 2012-09-01 (×3): qty 1

## 2012-09-01 MED ORDER — SODIUM CHLORIDE 0.9 % IJ SOLN: 3.0000 mL | INTRAMUSCULAR | Status: DC | PRN

## 2012-09-01 MED ORDER — POTASSIUM CHLORIDE 20 MEQ PO PACK
40.0000 meq | PACK | Freq: Once | ORAL | Status: AC
  Administered 2012-09-01: 40 meq via ORAL
  Filled 2012-09-01: qty 2

## 2012-09-01 MED ORDER — ONDANSETRON HCL 4 MG/2ML IJ SOLN: 4.0000 mg | Freq: Four times a day (QID) | INTRAMUSCULAR | Status: DC | PRN

## 2012-09-01 MED ORDER — POTASSIUM CHLORIDE 20 MEQ/15ML (10%) PO LIQD
ORAL | Status: AC
  Filled 2012-09-01: qty 30

## 2012-09-01 MED ORDER — MAGNESIUM HYDROXIDE 400 MG/5ML PO SUSP
30.0000 mL | Freq: Every day | ORAL | Status: DC | PRN
  Administered 2012-09-01: 30 mL via ORAL
  Filled 2012-09-01: qty 30

## 2012-09-01 MED ORDER — MIDAZOLAM HCL 2 MG/2ML IJ SOLN
INTRAMUSCULAR | Status: AC
  Filled 2012-09-01: qty 2

## 2012-09-01 MED ORDER — SODIUM CHLORIDE 0.9 % IV SOLN: 250.0000 mL | INTRAVENOUS | Status: DC | PRN

## 2012-09-01 MED ORDER — FENTANYL CITRATE 0.05 MG/ML IJ SOLN
INTRAMUSCULAR | Status: AC
  Filled 2012-09-01: qty 2

## 2012-09-01 MED ORDER — LIDOCAINE HCL (PF) 1 % IJ SOLN
INTRAMUSCULAR | Status: AC
  Filled 2012-09-01: qty 30

## 2012-09-01 MED ORDER — ASPIRIN 81 MG PO CHEW: 324.0000 mg | CHEWABLE_TABLET | ORAL | Status: DC

## 2012-09-01 MED ORDER — SODIUM CHLORIDE 0.9 % IV SOLN: 1.0000 mL/kg/h | INTRAVENOUS | Status: DC

## 2012-09-01 MED ORDER — SODIUM CHLORIDE 0.9 % IJ SOLN
3.0000 mL | Freq: Two times a day (BID) | INTRAMUSCULAR | Status: DC
  Administered 2012-09-01: 3 mL via INTRAVENOUS

## 2012-09-01 MED ORDER — ISOSORBIDE MONONITRATE ER 30 MG PO TB24
30.0000 mg | ORAL_TABLET | Freq: Every day | ORAL | Status: DC
  Administered 2012-09-01 – 2012-09-02 (×2): 30 mg via ORAL
  Filled 2012-09-01 (×2): qty 1

## 2012-09-01 MED ORDER — SODIUM CHLORIDE 0.9 % IV SOLN
INTRAVENOUS | Status: AC
  Administered 2012-09-01: 75 mL/h via INTRAVENOUS

## 2012-09-01 NOTE — Progress Notes (Signed)
ANTICOAGULATION CONSULT NOTE Pharmacy Consult for heparin Indication: chest pain/ACS  No Known Allergies  Patient Measurements: Height: 5\' 10"  (177.8 cm) Weight: 261 lb 4.8 oz (118.525 kg) IBW/kg (Calculated) : 73  Heparin Dosing Weight: 100  Vital Signs: Temp: 97.8 F (36.6 C) (12/31 0416) Temp src: Oral (12/31 0416) BP: 139/59 mmHg (12/31 0416) Pulse Rate: 50  (12/31 0416)  Labs:  Basename 09/01/12 0605 08/31/12 2351 08/31/12 1620 08/31/12 1619 08/29/12 1457 08/29/12 1224  HGB 13.6 -- -- 13.3 -- --  HCT 38.9* -- -- 40.3 -- 41.4  PLT 196 -- -- 231 -- 231  APTT -- -- -- 34 -- --  LABPROT -- -- -- 13.4 -- --  INR -- -- -- 1.03 -- --  HEPARINUNFRC 0.54 -- -- -- -- --  CREATININE -- -- -- 1.41* -- 1.29  CKTOTAL -- -- -- -- -- --  CKMB -- -- -- -- -- --  TROPONINI -- <0.30 <0.30 -- <0.30 --    Estimated Creatinine Clearance: 53 ml/min (by C-G formula based on Cr of 1.41).  Assessment: 78 yo with chest pani for Heparin   Goal of Therapy:  Heparin level 0.3-0.7 units/ml Monitor platelets by anticoagulation protocol: Yes   Plan:  Continue Heparin at current rate F/U after cath  Jadier Rockers, Gary Fleet 09/01/2012,7:11 AM

## 2012-09-01 NOTE — CV Procedure (Signed)
Procedure Performed:  1. Left Heart Catheterization 2. Selective Coronary Angiography 3. LIMA graft angiography 4. Left ventricular angiogram  Indication: Dyspnea, prior CABG 12/11.  Known occlusion of 3/4 bypass grafts.   Procedure Details:  The risks, benefits, complications, treatment options, and expected outcomes were discussed with the patient. The patient and/or family concurred with the proposed plan, giving informed consent. The patient was brought to the outpatient cath lab after IV hydration was begun and oral premedication was given. The patient was further sedated with Versed and Fentanyl. The right groin was prepped and draped in the usual manner. Using the modified Seldinger access technique, a 5 French sheath was placed in the right femoral artery.  Standard diagnostic catheters were used to perform selective coronary angiography and LIMA angiography.  A pigtail catheter was used to perform a left ventricular angiogram. There were no immediate complications. The patient was taken to the recovery area in stable condition.   Hemodynamic Findings:  Ao: 147/68  LV: 132/19  Angiographic Findings:   Left main: Distal 60-70% stenosis.   Left Anterior Descending Artery: The ostium has 40-50% stenosis. The first diagonal branch is occluded. The diagonal branch fills from the LIMA graft. The proximal LAD prior to D1 takeoff has 80% stenosis followed by a small aneurysmal segment.  Following the aneurysmal segment, there is a long segment of up to 80% stenosis. The moderate sized septal perforating branch has 99% ostial stenosis. The distal vessel is small with diffuse mild to moderate disease. SVG to LAD is known to be occluded at the ostium.   Circumflex Artery: This is a moderate sized vessel with diffuse disease. The proximal vessel has diffuse 50% stenosis. The OM branch has serial 60% lesions. The SVG to OM is known to be occluded.   Right Coronary Artery: Large dominant vessel with  30% proximal stenosis and mild plaque mid and distal. There is a small filling defect in the mid RCA likely representing a calcified nodule of plaque.  This does not create hemodynamically significant stenosis.  The posterolateral branch has 50% stenosis. The PDA is small and diffusely diseased.  SVG to PDA is known to be occluded.    Left Ventricular Angiogram: LVEF 55%, no regional wall motion abnormalities .  Impression:  1. Severe triple vessel CAD s/p 4 V CABG with only 1 patent bypass graft.  2. Severe disease in native LAD and PDA. Moderate disease in LCx. 3. Preserved LV systolic function.   Recommendations: There is moderate (60%) stenosis in the distal LM and complex severe disease in the proximal LAD around D1.  The LAD disease could potentially be treated interventionally but would be difficult.  Will start Imdur and ambulate patient.  If he remains symptomatic, will consider intervention to LAD.  Discussed with Dr. Excell Seltzer.   Levi [name] 09/01/2012 9:20 AM

## 2012-09-01 NOTE — Interval H&P Note (Signed)
History and Physical Interval Note:  09/01/2012 7:42 AM  Levi [name]  has presented today for surgery, with the diagnosis of Chest pain  The various methods of treatment have been discussed with the patient and family. After consideration of risks, benefits and other options for treatment, the patient has consented to  Procedure(s) (LRB) with comments: LEFT HEART CATHETERIZATION WITH CORONARY/GRAFT ANGIOGRAM (N/A) as a surgical intervention .  The patient's history has been reviewed, patient examined, no change in status, stable for surgery.  I have reviewed the patient's chart and labs.  Questions were answered to the patient's satisfaction.     Corianna Avallone [employer]

## 2012-09-02 DIAGNOSIS — I200 Unstable angina: Secondary | ICD-10-CM

## 2012-09-02 MED ORDER — METOPROLOL TARTRATE 12.5 MG HALF TABLET: 12.5000 mg | ORAL_TABLET | Freq: Two times a day (BID) | ORAL | Status: DC

## 2012-09-02 MED ORDER — METOPROLOL TARTRATE 12.5 MG HALF TABLET
12.5000 mg | ORAL_TABLET | Freq: Two times a day (BID) | ORAL | Status: DC
  Administered 2012-09-02: 12.5 mg via ORAL
  Filled 2012-09-02 (×2): qty 1

## 2012-09-02 MED ORDER — ISOSORBIDE MONONITRATE ER 30 MG PO TB24: 30.0000 mg | ORAL_TABLET | Freq: Every day | ORAL | Status: DC

## 2012-09-02 MED ORDER — ALPRAZOLAM 0.25 MG PO TABS: 0.2500 mg | ORAL_TABLET | Freq: Two times a day (BID) | ORAL | Status: DC | PRN

## 2012-09-02 MED ORDER — NITROGLYCERIN 0.4 MG SL SUBL: 0.4000 mg | SUBLINGUAL_TABLET | SUBLINGUAL | Status: DC | PRN

## 2012-09-02 NOTE — Progress Notes (Signed)
09/02/2012 9:22 AM Nursing note RN noted inability to administer scheduled metoprolol due to HR 54. Pt. Asymptomatic VSS. Dr. Jens Som paged and made aware. Orders received to place hold parameters for HR less than 50 bpm. Pt. Updated on plan. Will continue to monitor patient.  Clevon Khader, Blanchard Kelch

## 2012-09-02 NOTE — Progress Notes (Signed)
   Subjective:  Denies CP or dyspnea   Objective:  Filed Vitals:   09/01/12 1330 09/01/12 1508 09/01/12 1949 09/02/12 0430  BP: 112/48 116/56 112/45 102/41  Pulse:  55 55 55  Temp:  98 F (36.7 C) 97.5 F (36.4 C) 98.3 F (36.8 C)  TempSrc:  Oral Oral Oral  Resp:  18 18 18   Height:      Weight:    267 lb 3.2 oz (121.201 kg)  SpO2:  97% 96% 99%    Intake/Output from previous day:  Intake/Output Summary (Last 24 hours) at 09/02/12 0806 Last data filed at 09/01/12 1700  Gross per 24 hour  Intake    360 ml  Output      0 ml  Net    360 ml    Physical Exam: Physical exam: Well-developed well-nourished in no acute distress.  Skin is warm and dry.  HEENT is normal.  Neck is supple. Chest is clear to auscultation with normal expansion.  Cardiovascular exam is regular rate and rhythm.  Abdominal exam nontender or distended. No masses palpated. Right groin with no hematoma and no bruit Extremities show no edema. neuro grossly intact    Lab Results: Basic Metabolic Panel:  Basename 09/01/12 1419 09/01/12 0605 08/31/12 1619  NA -- 137 140  K -- 3.3* 4.3  CL -- 100 99  CO2 -- 27 30  GLUCOSE -- 95 94  BUN -- 22 26*  CREATININE 1.27 1.23 --  CALCIUM -- 9.0 9.5  MG -- -- --  PHOS -- -- --   CBC:  Basename 09/01/12 1044 09/01/12 0605 08/31/12 1619  WBC 7.2 8.4 --  NEUTROABS -- -- 4.9  HGB 14.0 13.6 --  HCT 40.2 38.9* --  MCV 89.7 89.2 --  PLT 172 196 --   Cardiac Enzymes:  Basename 09/01/12 0605 08/31/12 2351 08/31/12 1620  CKTOTAL -- -- --  CKMB -- -- --  CKMBINDEX -- -- --  TROPONINI <0.30 <0.30 <0.30     Assessment/Plan:  1 Chest pain - symptoms resolved; continue medical therapy; ambulate; DC later today if remains asymptomatic; if recurrent symptoms in the future, consider PCI. 2 CAD - continue asa and statin. 3 HTN - continue present BP meds 4 Hyperlipidemia - continue statin. >30 min PA and physician time D2  Olga Millers 09/02/2012, 8:06  AM

## 2012-09-02 NOTE — Discharge Summary (Signed)
Physician Discharge Summary  Patient ID: Levi [name] MRN: [id number] DOB/AGE: 1929/03/03 79 y.o.  Admit date: 08/31/2012 Discharge date: 09/02/2012  Primary Discharge Diagnosis: Unstable Angina Secondary Discharge Diagnosis 1. Severe CAD 2. DOE 3. COPD/Emphysema 4. Prostate Cancer 5. Obesity 6. Osteopenia   Significant Diagnostic Studies:  1.Cardiac Cath 09/01/12  Angiographic Findings:  Left main: Distal 60-70% stenosis.  Left Anterior Descending Artery: The ostium has 40-50% stenosis. The first diagonal branch is occluded. The diagonal branch fills from the LIMA graft. The proximal LAD prior to D1 takeoff has 80% stenosis followed by a small aneurysmal segment. Following the aneurysmal segment, there is a long segment of up to 80% stenosis. The moderate sized septal perforating branch has 99% ostial stenosis. The distal vessel is small with diffuse mild to moderate disease. SVG to LAD is known to be occluded at the ostium.  Circumflex Artery: This is a moderate sized vessel with diffuse disease. The proximal vessel has diffuse 50% stenosis. The OM branch has serial 60% lesions. The SVG to OM is known to be occluded.  Right Coronary Artery: Large dominant vessel with 30% proximal stenosis and mild plaque mid and distal. There is a small filling defect in the mid RCA likely representing a calcified nodule of plaque. This does not create hemodynamically significant stenosis. The posterolateral branch has 50% stenosis. The PDA is small and diffusely diseased. SVG to PDA is known to be occluded.  Left Ventricular Angiogram: LVEF 55%, no regional wall motion abnormalities  .  Impression:  1. Severe triple vessel CAD s/p 4 V CABG with only 1 patent bypass graft.  2. Severe disease in native LAD and PDA. Moderate disease in LCx.  3. Preserved LV systolic function.  Recommendations: There is moderate (60%) stenosis in the distal LM and complex severe disease in the proximal LAD around D1.  The LAD disease could potentially be treated interventionally but would be difficult. Will start Imdur and ambulate patient. If he remains symptomatic, will consider intervention to LAD. Discussed with Dr. Excell Seltzer.    Hospital Course: [name] is an 79 year old patient of Dr. Charlton Haws admitted with complaints of chest tightness, nausea, and diaphoresis. He is a history of CAD and was seen in by Dr. Eden Emms in August of 2013. He had a cardiac catheterization in February 2013 after a nap Myoview showed a small area of apical ischemia. Cardiac cath showed three-vessel CAD with only one patent graft out of the 4 (LIMA to LAD) and severe disease of the native LAD and circumflex. He was recommended for increased medical therapy. On day of admission he was experiencing unstable angina while sitting in a golf cart. He described initial onset of diaphoresis, weakness, and then followed by chest pressure. He was seen at [facility] [place] but sent home. He rested the following day, the date admission again developed chest tightness and diaphoresis with dyspnea. Office was called and he was sent for direct admission and cardiac catheterization.    Cardiac catheterization was performed by Dr. Marca Ancona on 09/01/2002 with results as described above. His recommendation stated that there was moderate 60% stenosis of the distal left main and complex severe disease of the proximal LAD and diagonal one. The LAD disease could potentially be treated interventionally but it would be difficult. He wished to start the patient on nitrates and ambulate. If he remains symptomatic he would consider intervention to the LAD.   The patient was seen and examined by Dr. Jens Som on day of  discharge. Metoprolol was decreased to 12.5 mg daily due to bradycardia.Marland Kitchen He was continued on medical therapy. He had no further complaints of chest discomfort on day of discharge. The patient will return home with a followup appointment with Dr. Eden Emms  in the next couple of weeks. If he continues to have recurrent discomfort in his chest he will be readmitted with possibility of PCI to the LAD as described for cardiac catheterization recommendations.    Discharge Exam: Blood pressure 102/41, pulse 55, temperature 98.3 F (36.8 C), temperature source Oral, resp. rate 18, height 5\' 10"  (1.778 m), weight 267 lb 3.2 oz (121.201 kg), SpO2 99.00%.   Labs:   Lab Results  Component Value Date   WBC 7.2 09/01/2012   HGB 14.0 09/01/2012   HCT 40.2 09/01/2012   MCV 89.7 09/01/2012   PLT 172 09/01/2012    Lab Results  Component Value Date   CKTOTAL 48 08/21/2010   CKMB 2.8 08/21/2010   TROPONINI <0.30 09/01/2012    Lab Results  Component Value Date   CHOL  Value: 152        ATP III CLASSIFICATION:  <200     mg/dL   Desirable  295-621  mg/dL   Borderline High  >=308    mg/dL   High        65/78/4696   Lab Results  Component Value Date   HDL 30* 08/21/2010   Lab Results  Component Value Date   LDLCALC  Value: 71        Total Cholesterol/HDL:CHD Risk Coronary Heart Disease Risk Table                     Men   Women  1/2 Average Risk   3.4   3.3  Average Risk       5.0   4.4  2 X Average Risk   9.6   7.1  3 X Average Risk  23.4   11.0        Use the calculated Patient Ratio above and the CHD Risk Table to determine the patient's CHD Risk.        ATP III CLASSIFICATION (LDL):  <100     mg/dL   Optimal  295-284  mg/dL   Near or Above                    Optimal  130-159  mg/dL   Borderline  132-440  mg/dL   High  >102     mg/dL   Very High 72/53/6644   Lab Results  Component Value Date   TRIG 253* 08/21/2010   Lab Results  Component Value Date   CHOLHDL 5.1 08/21/2010   No results found for this basename: LDLDIRECT      Radiology: Dg Chest 2 View  08/29/2012  *RADIOLOGY REPORT*  Clinical Data: Short of breath  CHEST - 2 VIEW  Comparison: Chest film 09/10/2010  Findings: Sternotomy wires overlie normal cardiac silhouette with ectatic  aorta.  Mild basilar atelectasis unchanged from prior. There is mild bronchitic change centrally which is stable. Anterior cervical fusion noted.  No pleural fluid.  No focal consolidation.  No pneumothorax. Degenerative osteophytosis of the thoracic spine.  IMPRESSION:  1. No acute cardiopulmonary process. 2.  Low lung volumes and chronic bronchitic change.   Original Report Authenticated By: Genevive Bi, M.D.     EKG Rate of 51 bpm Sinus bradycardia with 1st degree A-V block Right bundle branch  block Left anterior fascicular block Bifascicular block  Minimal voltage criteria for LVH, may be normal variant Lateral infarct , age undetermined  FOLLOW UP PLANS AND APPOINTMENTS Discharge Orders    Future Appointments: Provider: Department: Dept Phone: Center:   09/11/2012 3:15 PM Wendall Stade, MD [facility] Main Office [place]) [phone] [id number]     Future Orders Please Complete By Expires   Diet - low sodium heart healthy      Increase activity slowly          Medication List     As of 09/02/2012  9:24 AM    TAKE these medications         albuterol-ipratropium 18-103 MCG/ACT inhaler   Commonly known as: COMBIVENT   Inhale 2 puffs into the lungs every 6 (six) hours as needed. For wheezing      ALPRAZolam 0.25 MG tablet   Commonly known as: XANAX   Take 1 tablet (0.25 mg total) by mouth 2 (two) times daily as needed for anxiety.      aspirin EC 81 MG tablet   Take 81 mg by mouth every evening.      hydrochlorothiazide 25 MG tablet   Commonly known as: HYDRODIURIL   Take 25 mg by mouth every morning.      isosorbide mononitrate 30 MG 24 hr tablet   Commonly known as: IMDUR   Take 1 tablet (30 mg total) by mouth daily.      losartan 50 MG tablet   Commonly known as: COZAAR   Take 50 mg by mouth at bedtime.      metoprolol tartrate 12.5 mg Tabs   Commonly known as: LOPRESSOR   Take 0.5 tablets (12.5 mg total) by mouth 2 (two) times daily.       nitroGLYCERIN 0.4 MG SL tablet   Commonly known as: NITROSTAT   Place 1 tablet (0.4 mg total) under the tongue every 5 (five) minutes x 3 doses as needed for chest pain.      pravastatin 20 MG tablet   Commonly known as: PRAVACHOL   Take 20 mg by mouth daily.      ranolazine 500 MG 12 hr tablet   Commonly known as: RANEXA   Take 1 tablet (500 mg total) by mouth 2 (two) times daily.           Follow-up Information    Follow up with Charlton Haws, MD. (Our office will call you for appointment)    Contact information:   [address] [place] [address] [phone]            Time spent with patient to include physician time:35 minutes Signed: Joni Reining 09/02/2012, 9:24 AM Co-Sign MD

## 2012-09-02 NOTE — Progress Notes (Signed)
09/02/2012 11:48 AM Nursing note Discharge avs form, rx, medications already taken today and those due this evening given and explained to patient and family. Follow up appointments, cath site care and when to call MD reviewed. Reviewed correct use of SL NTG with patient using teach back method. Pt. Verbalized understanding of SL NTG as well as how to correctly take BP/HR. D/c iv line. D/c tele. D/c home with wife per orders. Questions and concerns addressed.  Milanya Sunderland, Blanchard Kelch

## 2012-09-03 NOTE — ED Provider Notes (Signed)
Medical screening examination/treatment/procedure(s) were conducted as a shared visit with non-physician practitioner(s) and myself.  I personally evaluated the patient during the encounter.  Patient seen and examined. Care plan was developed in conjunction with Dr. Johney Frame, of cardiology. Patient will be seen in the office and followup at that time. Return if symptoms worsen.  Gilda Crease, MD 09/03/12 [phone]

## 2012-09-03 NOTE — Discharge Summary (Signed)
See progress notes Levi [name]  

## 2012-09-11 ENCOUNTER — Encounter: Payer: Medicare Other | Admitting: Cardiovascular Disease

## 2012-09-14 ENCOUNTER — Other Ambulatory Visit: Payer: Self-pay | Admitting: Cardiovascular Disease

## 2012-09-14 NOTE — Telephone Encounter (Signed)
New Problem:    Patient called in wanting to know if there were any samples of ranolazine (RANEXA) 500 MG 12 hr tablet available for him to have.  Please call back

## 2012-09-15 NOTE — Telephone Encounter (Signed)
F/u   Samples of ranexa 500 mg.

## 2012-09-15 NOTE — Telephone Encounter (Signed)
RANEXA SAMPLES  LEFT AT FRONT DESK  PT'S WIFE AWARE .Zack Seal

## 2012-09-16 ENCOUNTER — Encounter: Payer: Medicare Other | Admitting: Cardiovascular Disease

## 2012-09-21 ENCOUNTER — Other Ambulatory Visit: Payer: Self-pay | Admitting: Cardiovascular Disease

## 2012-09-21 ENCOUNTER — Encounter: Payer: Medicare Other | Admitting: Internal Medicine

## 2012-09-21 MED ORDER — HYDROCHLOROTHIAZIDE 25 MG PO TABS: 25.0000 mg | ORAL_TABLET | Freq: Every morning | ORAL | Status: DC

## 2012-09-28 ENCOUNTER — Encounter: Payer: Self-pay | Admitting: Internal Medicine

## 2012-09-28 ENCOUNTER — Ambulatory Visit (INDEPENDENT_AMBULATORY_CARE_PROVIDER_SITE_OTHER): Payer: Medicare Other | Admitting: Internal Medicine

## 2012-09-28 ENCOUNTER — Telehealth: Payer: Self-pay | Admitting: *Deleted

## 2012-09-28 VITALS — BP 100/55 | HR 52 | Ht 70.0 in | Wt 269.0 lb

## 2012-09-28 DIAGNOSIS — I10 Essential (primary) hypertension: Secondary | ICD-10-CM

## 2012-09-28 DIAGNOSIS — I25810 Atherosclerosis of coronary artery bypass graft(s) without angina pectoris: Secondary | ICD-10-CM

## 2012-09-28 DIAGNOSIS — E785 Hyperlipidemia, unspecified: Secondary | ICD-10-CM

## 2012-09-28 NOTE — Progress Notes (Signed)
HPI Patient is an 79 yo with a history of CAD>   Presented to [facility] in late December with CP  Cath showed  3V CAD  With 1/4 patient grafts.  Patient with 60^ distal LM  Complex disease of Prox LAD   Difficult to intervene on Plan for trial of medical Rx  Imdur started.   SInce d/c he continues to get SOB with activity.  No CP  Just cant do anything 2 episodes of near syncope  One occurred after walking to shop  Turned around  [place] back into house  Broke out into sweat No PND No Known Allergies  Current Outpatient Prescriptions  Medication Sig Dispense Refill  . albuterol-ipratropium (COMBIVENT) 18-103 MCG/ACT inhaler Inhale 2 puffs into the lungs every 6 (six) hours as needed. For wheezing      . ALPRAZolam (XANAX) 0.25 MG tablet Take 1 tablet (0.25 mg total) by mouth 2 (two) times daily as needed for anxiety.  30 tablet  1  . aspirin 325 MG tablet Take 1/2 tab daily      . hydrochlorothiazide (HYDRODIURIL) 25 MG tablet Take 1 tablet (25 mg total) by mouth every morning.  90 tablet  1  . isosorbide mononitrate (IMDUR) 30 MG 24 hr tablet Take 1 tablet (30 mg total) by mouth daily.  30 tablet  10  . losartan (COZAAR) 50 MG tablet Take 50 mg by mouth at bedtime.      . metoprolol tartrate (LOPRESSOR) 25 MG tablet Take 1/2 tab twice a day      . nitroGLYCERIN (NITROSTAT) 0.4 MG SL tablet Place 1 tablet (0.4 mg total) under the tongue every 5 (five) minutes x 3 doses as needed for chest pain.  30 tablet  4  . pravastatin (PRAVACHOL) 20 MG tablet Take 20 mg by mouth daily.      . ranolazine (RANEXA) 500 MG 12 hr tablet Take 1 tablet (500 mg total) by mouth 2 (two) times daily.  60 tablet  1    Past Medical History  Diagnosis Date  . HTN (hypertension)   . HLD (hyperlipidemia)   . Edema   . Benign neoplasm of colon   . Dyspnea on exertion   . Prostate cancer   . Osteopenia   . Emphysema   . Bronchitis   . Obesity   . Malaise and fatigue   . CAD (coronary artery disease) Dec 2011   s/p CABG per Dr. Cornelius Moras; had normal EF    Past Surgical History  Procedure Date  . Median sternotomy 08/23/10  . Coronary artery bypass graft 08/23/10  . Cervical disc surgery     Family History  Problem Relation Age of Onset  . Heart disease Brother   . Skin cancer Brother   . Prostate cancer Brother   . Kidney cancer Father   . Heart attack Father     History   Social History  . Marital Status: Married    Spouse Name: N/A    Number of Children: N/A  . Years of Education: N/A   Occupational History  . retired Chartered certified accountant    Social History Main Topics  . Smoking status: Former Games developer  . Smokeless tobacco: Not on file  . Alcohol Use: No  . Drug Use: No  . Sexually Active: Not on file   Other Topics Concern  . Not on file   Social History Narrative  . No narrative on file    Review of Systems:  All systems  reviewed.  They are negative to the above problem except as previously stated.  Vital Signs: BP 100/55  Pulse 52  Ht 5\' 10"  (1.778 m)  Wt 269 lb (122.018 kg)  BMI 38.60 kg/m2  Physical Exam Patient is in NAD HEENT:  Normocephalic, atraumatic. EOMI, PERRLA.  Neck: JVP is normal.   Lungs: clear to auscultation. No rales no wheezes.  Heart: Regular rate and rhythm. Normal S1, S2. No S3.   No significant murmurs. PMI not displaced.  Abdomen:  Supple, nontender. Normal bowel sounds. No masses. No hepatomegaly.  Extremities:   Good distal pulses throughout. No lower extremity edema.  Musculoskeletal :moving all extremities.  Neuro:   alert and oriented x3.  CN II-XII grossly intact.   Assessment and Plan:  1.  CAD  I have contacted [facility] and reviewed.  Will plan to sched patient for elective PTCA.  Patient would like take vacation  Will call back to set date.  Will need precath labs done prior  2.  HL  Keep on statin  3.  HTN  Good control.

## 2012-09-28 NOTE — Telephone Encounter (Signed)
Dr. Tenny Craw spoke with pt at length regarding the need for heart cath with intervention to be done by Dr. Excell Seltzer.  Pt was provided with Dr. Earmon Phoenix available cath lab dates.  Pt states that he will call back to schedule when he has decided on a date.

## 2012-09-28 NOTE — Patient Instructions (Addendum)
Dr. Tenny Craw wants to discuss your case with Dr. Excell Seltzer.  We will call you once Dr. Tenny Craw works out the details.

## 2012-10-01 ENCOUNTER — Encounter: Payer: Self-pay | Admitting: Internal Medicine

## 2012-10-01 NOTE — Telephone Encounter (Signed)
Error

## 2012-10-05 ENCOUNTER — Ambulatory Visit (INDEPENDENT_AMBULATORY_CARE_PROVIDER_SITE_OTHER): Payer: Medicare Other | Admitting: Cardiovascular Disease

## 2012-10-05 ENCOUNTER — Encounter: Payer: Self-pay | Admitting: Cardiovascular Disease

## 2012-10-05 VITALS — BP 104/60 | HR 48 | Ht 70.0 in | Wt 269.0 lb

## 2012-10-05 DIAGNOSIS — I2510 Atherosclerotic heart disease of native coronary artery without angina pectoris: Secondary | ICD-10-CM

## 2012-10-05 NOTE — Progress Notes (Signed)
 HPI:  79-year-old gentleman presenting for evaluation of coronary artery disease and chronic angina. The patient's had prior CABG. All of his vein grafts are occluded and his only remaining graft is a LIMA to diagonal. I have reviewed his cardiac cath films and he has moderately tight distal left main disease, severe diffuse proximal and mid LAD stenosis, moderate left circumflex stenosis, and moderate stenosis of the right coronary artery. He presents today to discuss potential revascularization option with PCI.  The patient is primarily limited by shortness of breath. He describes shortness of breath with low-level activity. This is been present for several years. He did not notice any improvement even early after his bypass surgery. He's had a few episodes of tightness in his chest associated diaphoresis. This is occurred 3 times in the last 2 months. He denies exertional chest tightness, chest pain, or lightheadedness. He denies orthopnea, PND, or leg swelling.  Outpatient Encounter Prescriptions as of 10/05/2012  Medication Sig Dispense Refill  . albuterol-ipratropium (COMBIVENT) 18-103 MCG/ACT inhaler Inhale 2 puffs into the lungs every 6 (six) hours as needed. For wheezing      . ALPRAZolam (XANAX) 0.25 MG tablet Take 1 tablet (0.25 mg total) by mouth 2 (two) times daily as needed for anxiety.  30 tablet  1  . aspirin 325 MG tablet Take 1/2 tab daily      . hydrochlorothiazide (HYDRODIURIL) 25 MG tablet Take 1 tablet (25 mg total) by mouth every morning.  90 tablet  1  . isosorbide mononitrate (IMDUR) 30 MG 24 hr tablet Take 1 tablet (30 mg total) by mouth daily.  30 tablet  10  . losartan (COZAAR) 50 MG tablet Take 50 mg by mouth at bedtime.      . metoprolol tartrate (LOPRESSOR) 25 MG tablet Take 1/2 tab twice a day      . nitroGLYCERIN (NITROSTAT) 0.4 MG SL tablet Place 1 tablet (0.4 mg total) under the tongue every 5 (five) minutes x 3 doses as needed for chest pain.  30 tablet  4  .  pravastatin (PRAVACHOL) 20 MG tablet Take 20 mg by mouth daily.      . ranolazine (RANEXA) 500 MG 12 hr tablet Take 1 tablet (500 mg total) by mouth 2 (two) times daily.  60 tablet  1  . fluticasone (FLONASE) 50 MCG/ACT nasal spray         Review of patient's allergies indicates no known allergies.  Past Medical History  Diagnosis Date  . HTN (hypertension)   . HLD (hyperlipidemia)   . Edema   . Benign neoplasm of colon   . Dyspnea on exertion   . Prostate cancer   . Osteopenia   . Emphysema   . Bronchitis   . Obesity   . Malaise and fatigue   . CAD (coronary artery disease) Dec 2011    s/p CABG per Dr. Owen; had normal EF    Past Surgical History  Procedure Date  . Median sternotomy 08/23/10  . Coronary artery bypass graft 08/23/10  . Cervical disc surgery     History   Social History  . Marital Status: Married    Spouse Name: N/A    Number of Children: N/A  . Years of Education: N/A   Occupational History  . retired machinist    Social History Main Topics  . Smoking status: Former Smoker  . Smokeless tobacco: Not on file  . Alcohol Use: No  . Drug Use: No  . Sexually   Active: Not on file   Other Topics Concern  . Not on file   Social History Narrative  . No narrative on file    Family History  Problem Relation Age of Onset  . Heart disease Brother   . Skin cancer Brother   . Prostate cancer Brother   . Kidney cancer Father   . Heart attack Father     ROS:  Negative except as outlined in the history of present illness  BP 104/60  Pulse 48  Ht 5' 10" (1.778 m)  Wt 122.018 kg (269 lb)  BMI 38.60 kg/m2  PHYSICAL EXAM: Pt is alert and oriented, WD, WN, obese elderly male in no distress. HEENT: normal Neck: JVP normal. Carotid upstrokes normal without bruits. No thyromegaly. Lungs: equal expansion, clear bilaterally CV: Apex is discrete and nondisplaced, RRR without murmur or gallop Abd: soft, NT, +BS Back: no CVA tenderness Ext: no C/C/E          DP/PT pulses intact and = Skin: warm and dry without rash Neuro: CNII-XII intact             Strength intact = bilaterally  Cardiac catheterization: Hemodynamic Findings:  Ao: 147/68  LV: 132/19  Angiographic Findings:  Left main: Distal 60-70% stenosis.  Left Anterior Descending Artery: The ostium has 40-50% stenosis. The first diagonal branch is occluded. The diagonal branch fills from the LIMA graft. The proximal LAD prior to D1 takeoff has 80% stenosis followed by a small aneurysmal segment. Following the aneurysmal segment, there is a long segment of up to 80% stenosis. The moderate sized septal perforating branch has 99% ostial stenosis. The distal vessel is small with diffuse mild to moderate disease. SVG to LAD is known to be occluded at the ostium.  Circumflex Artery: This is a moderate sized vessel with diffuse disease. The proximal vessel has diffuse 50% stenosis. The OM branch has serial 60% lesions. The SVG to OM is known to be occluded.  Right Coronary Artery: Large dominant vessel with 30% proximal stenosis and mild plaque mid and distal. There is a small filling defect in the mid RCA likely representing a calcified nodule of plaque. This does not create hemodynamically significant stenosis. The posterolateral branch has 50% stenosis. The PDA is small and diffusely diseased. SVG to PDA is known to be occluded.  Left Ventricular Angiogram: LVEF 55%, no regional wall motion abnormalities  .  Impression:  1. Severe triple vessel CAD s/p 4 V CABG with only 1 patent bypass graft.  2. Severe disease in native LAD and PDA. Moderate disease in LCx.  3. Preserved LV systolic function.  Recommendations: There is moderate (60%) stenosis in the distal LM and complex severe disease in the proximal LAD around D1. The LAD disease could potentially be treated interventionally but would be difficult. Will start Imdur and ambulate patient. If he remains symptomatic, will consider intervention  to LAD. Discussed with Dr. Jayleen Scaglione.   ASSESSMENT AND PLAN: Severe three-vessel coronary artery disease status post CABG with class III symptoms, primarily dyspnea. I have carefully reviewed the patient's cardiac catheterization studies. Images were reviewed in depth with the patient and his family. I think it is technically feasible to treat his LAD with percutaneous intervention. The procedure would be complex because of his distal left main disease which is moderate by my estimate. He also has long segment stenosis of the LAD. The diagonal branch is the only protected vessel. I reviewed these technical issues, but I believe the procedure   would have a high likelihood of success and the risk of major complication such as myocardial infarction or death would be in the 1-2% range.  My biggest concern is whether this patient's shortness of breath is related to coronary ischemia. He is obese and sedentary and I suspect these are the primary reasons for his dyspnea. He understands that PCI may not improve his breathing, but certainly could improve his symptoms of angina. He would like to think over his options a bit more and will contact me if he would like to proceed. In the meantime he will remain on his current medical program and will followup as scheduled with Dr. Ross.  Greater than 45 minutes was spent in discussion with the patient and his family today.  Levi [name] 10/05/2012 9:20 AM      

## 2012-10-05 NOTE — Patient Instructions (Signed)
Please contact the office if you would like to proceed with a coronary intervention.

## 2012-10-06 ENCOUNTER — Telehealth: Payer: Self-pay | Admitting: Cardiovascular Disease

## 2012-10-06 NOTE — Telephone Encounter (Signed)
New Problem:    Patient called in to schedule the date for his stent placement.  Please call back.

## 2012-10-06 NOTE — Telephone Encounter (Signed)
Will forward to Lauren to set-up.

## 2012-10-07 ENCOUNTER — Encounter (HOSPITAL_COMMUNITY): Payer: Self-pay | Admitting: Pharmacy Technician

## 2012-10-07 MED ORDER — CLOPIDOGREL BISULFATE 75 MG PO TABS: 75.0000 mg | ORAL_TABLET | Freq: Every day | ORAL | Status: AC

## 2012-10-07 NOTE — Telephone Encounter (Signed)
I spoke with the pt and gave him instructions for cardiac cath on 10/14/12. Rx for plavix sent to the pharmacy. The pt will start Plavix 75mg  once a day on 10/08/12.

## 2012-10-07 NOTE — Telephone Encounter (Signed)
New Problem    Pt wanted to schedule an appt to put a stient in.

## 2012-10-07 NOTE — Telephone Encounter (Signed)
Per Dr Excell Seltzer the pt should start Plavix 75mg  once a day.

## 2012-10-07 NOTE — Telephone Encounter (Signed)
I spoke with the pt and will arrange PCI on 10/14/12.  I will speak with Dr Excell Seltzer to determine if the pt needs to start Plavix etc. prior to PCI.

## 2012-10-13 ENCOUNTER — Encounter: Payer: Self-pay | Admitting: Cardiovascular Disease

## 2012-10-13 NOTE — Telephone Encounter (Signed)
New Problem   Pt is calling wanting to speak to nurse. He wants to know if his heart cath that is scheduled is totally necessarily.

## 2012-10-13 NOTE — Telephone Encounter (Signed)
This encounter was created in error - please disregard.

## 2012-10-14 ENCOUNTER — Encounter (HOSPITAL_COMMUNITY)
Admission: RE | Disposition: A | Discharge status: Home / Self Care | Payer: Self-pay | Source: Ambulatory Visit | Attending: Cardiovascular Disease

## 2012-10-14 ENCOUNTER — Ambulatory Visit (HOSPITAL_COMMUNITY)
Admission: RE | Admit: 2012-10-14 | Discharge: 2012-10-15 | Disposition: A | Discharge status: Home / Self Care | Payer: Medicare Other | Source: Ambulatory Visit | Attending: Cardiovascular Disease | Admitting: Cardiovascular Disease

## 2012-10-14 ENCOUNTER — Other Ambulatory Visit: Payer: Self-pay | Admitting: Cardiovascular Disease

## 2012-10-14 DIAGNOSIS — I2510 Atherosclerotic heart disease of native coronary artery without angina pectoris: Secondary | ICD-10-CM | POA: Insufficient documentation

## 2012-10-14 DIAGNOSIS — I209 Angina pectoris, unspecified: Secondary | ICD-10-CM | POA: Diagnosis not present

## 2012-10-14 DIAGNOSIS — E785 Hyperlipidemia, unspecified: Secondary | ICD-10-CM | POA: Diagnosis not present

## 2012-10-14 DIAGNOSIS — I10 Essential (primary) hypertension: Secondary | ICD-10-CM | POA: Insufficient documentation

## 2012-10-14 DIAGNOSIS — Z951 Presence of aortocoronary bypass graft: Secondary | ICD-10-CM | POA: Diagnosis not present

## 2012-10-14 HISTORY — PX: PERCUTANEOUS CORONARY STENT INTERVENTION (PCI-S): SHX5485

## 2012-10-14 HISTORY — PX: CORONARY ANGIOPLASTY WITH STENT PLACEMENT: SHX49

## 2012-10-14 LAB — CBC
HCT: 41.4 % (ref 39.0–52.0)
Hemoglobin: 14.1 g/dL (ref 13.0–17.0)
MCH: 30.6 pg (ref 26.0–34.0)
MCV: 89.8 fL (ref 78.0–100.0)
RBC: 4.61 MIL/uL (ref 4.22–5.81)

## 2012-10-14 LAB — BASIC METABOLIC PANEL
CO2: 27 mEq/L (ref 19–32)
Calcium: 9.4 mg/dL (ref 8.4–10.5)
Chloride: 101 mEq/L (ref 96–112)
Creatinine, Ser: 1.18 mg/dL (ref 0.50–1.35)
Glucose, Bld: 94 mg/dL (ref 70–99)

## 2012-10-14 SURGERY — PERCUTANEOUS CORONARY STENT INTERVENTION (PCI-S)
Anesthesia: LOCAL

## 2012-10-14 MED ORDER — ALPRAZOLAM 0.25 MG PO TABS: 0.2500 mg | ORAL_TABLET | Freq: Two times a day (BID) | ORAL | Status: DC | PRN

## 2012-10-14 MED ORDER — SODIUM CHLORIDE 0.9 % IJ SOLN: 3.0000 mL | INTRAMUSCULAR | Status: DC | PRN

## 2012-10-14 MED ORDER — ISOSORBIDE MONONITRATE ER 30 MG PO TB24
30.0000 mg | ORAL_TABLET | Freq: Every day | ORAL | Status: DC
  Administered 2012-10-15: 30 mg via ORAL
  Filled 2012-10-14: qty 1

## 2012-10-14 MED ORDER — SODIUM CHLORIDE 0.9 % IJ SOLN
3.0000 mL | Freq: Two times a day (BID) | INTRAMUSCULAR | Status: DC
  Administered 2012-10-14 – 2012-10-15 (×2): 3 mL via INTRAVENOUS

## 2012-10-14 MED ORDER — SODIUM CHLORIDE 0.9 % IV SOLN
INTRAVENOUS | Status: DC
  Administered 2012-10-14: 10:00:00 via INTRAVENOUS

## 2012-10-14 MED ORDER — NITROGLYCERIN 0.4 MG SL SUBL: 0.4000 mg | SUBLINGUAL_TABLET | SUBLINGUAL | Status: DC | PRN

## 2012-10-14 MED ORDER — FLUTICASONE PROPIONATE 50 MCG/ACT NA SUSP
1.0000 | Freq: Every day | NASAL | Status: DC | PRN
  Filled 2012-10-14: qty 16

## 2012-10-14 MED ORDER — HYDROCHLOROTHIAZIDE 25 MG PO TABS
25.0000 mg | ORAL_TABLET | Freq: Every morning | ORAL | Status: DC
  Administered 2012-10-15: 10:00:00 25 mg via ORAL
  Filled 2012-10-14: qty 1

## 2012-10-14 MED ORDER — ACETAMINOPHEN 325 MG PO TABS: 650.0000 mg | ORAL_TABLET | ORAL | Status: DC | PRN

## 2012-10-14 MED ORDER — SODIUM CHLORIDE 0.9 % IV SOLN
1.0000 mL/kg/h | INTRAVENOUS | Status: AC
  Administered 2012-10-14: 1 mL/kg/h via INTRAVENOUS

## 2012-10-14 MED ORDER — SODIUM CHLORIDE 0.9 % IJ SOLN: 3.0000 mL | Freq: Two times a day (BID) | INTRAMUSCULAR | Status: DC

## 2012-10-14 MED ORDER — LOSARTAN POTASSIUM 50 MG PO TABS
50.0000 mg | ORAL_TABLET | Freq: Every day | ORAL | Status: DC
  Administered 2012-10-14: 50 mg via ORAL
  Filled 2012-10-14 (×2): qty 1

## 2012-10-14 MED ORDER — METOPROLOL TARTRATE 12.5 MG HALF TABLET
12.5000 mg | ORAL_TABLET | Freq: Two times a day (BID) | ORAL | Status: DC
  Administered 2012-10-14 – 2012-10-15 (×2): 12.5 mg via ORAL
  Filled 2012-10-14 (×3): qty 1

## 2012-10-14 MED ORDER — IPRATROPIUM-ALBUTEROL 18-103 MCG/ACT IN AERO
2.0000 | INHALATION_SPRAY | Freq: Four times a day (QID) | RESPIRATORY_TRACT | Status: DC | PRN
  Filled 2012-10-14: qty 14.7

## 2012-10-14 MED ORDER — CLOPIDOGREL BISULFATE 75 MG PO TABS
75.0000 mg | ORAL_TABLET | Freq: Every day | ORAL | Status: DC
  Administered 2012-10-15: 10:00:00 75 mg via ORAL
  Filled 2012-10-14: qty 1

## 2012-10-14 MED ORDER — LIDOCAINE HCL (PF) 1 % IJ SOLN
INTRAMUSCULAR | Status: AC
  Filled 2012-10-14: qty 30

## 2012-10-14 MED ORDER — ASPIRIN 81 MG PO CHEW
324.0000 mg | CHEWABLE_TABLET | ORAL | Status: AC
  Administered 2012-10-14: 324 mg via ORAL

## 2012-10-14 MED ORDER — SIMVASTATIN 10 MG PO TABS
10.0000 mg | ORAL_TABLET | Freq: Every day | ORAL | Status: DC
  Administered 2012-10-14: 18:00:00 10 mg via ORAL
  Filled 2012-10-14 (×2): qty 1

## 2012-10-14 MED ORDER — OXYCODONE-ACETAMINOPHEN 5-325 MG PO TABS: 1.0000 | ORAL_TABLET | ORAL | Status: DC | PRN

## 2012-10-14 MED ORDER — HEPARIN (PORCINE) IN NACL 2-0.9 UNIT/ML-% IJ SOLN
INTRAMUSCULAR | Status: AC
  Filled 2012-10-14: qty 500

## 2012-10-14 MED ORDER — ONDANSETRON HCL 4 MG/2ML IJ SOLN: 4.0000 mg | Freq: Four times a day (QID) | INTRAMUSCULAR | Status: DC | PRN

## 2012-10-14 MED ORDER — SODIUM CHLORIDE 0.9 % IV SOLN: 250.0000 mL | INTRAVENOUS | Status: DC | PRN

## 2012-10-14 MED ORDER — RANOLAZINE ER 500 MG PO TB12
500.0000 mg | ORAL_TABLET | Freq: Two times a day (BID) | ORAL | Status: DC
  Administered 2012-10-14 – 2012-10-15 (×2): 500 mg via ORAL
  Filled 2012-10-14 (×3): qty 1

## 2012-10-14 MED ORDER — VERAPAMIL HCL 2.5 MG/ML IV SOLN
INTRAVENOUS | Status: AC
  Filled 2012-10-14: qty 2

## 2012-10-14 MED ORDER — BIVALIRUDIN 250 MG IV SOLR
0.2500 mg/kg/h | INTRAVENOUS | Status: DC
  Filled 2012-10-14 (×2): qty 250

## 2012-10-14 MED ORDER — HEPARIN (PORCINE) IN NACL 2-0.9 UNIT/ML-% IJ SOLN
INTRAMUSCULAR | Status: AC
  Filled 2012-10-14: qty 1000

## 2012-10-14 MED ORDER — ASPIRIN 81 MG PO CHEW
CHEWABLE_TABLET | ORAL | Status: AC
  Administered 2012-10-14: 324 mg via ORAL
  Filled 2012-10-14: qty 4

## 2012-10-14 MED ORDER — ASPIRIN 325 MG PO TABS
162.5000 mg | ORAL_TABLET | Freq: Every day | ORAL | Status: DC
  Filled 2012-10-14: qty 0.5

## 2012-10-14 MED ORDER — NITROGLYCERIN 1 MG/10 ML FOR IR/CATH LAB
INTRA_ARTERIAL | Status: AC
  Filled 2012-10-14: qty 10

## 2012-10-14 MED ORDER — BIVALIRUDIN 250 MG IV SOLR
INTRAVENOUS | Status: AC
  Filled 2012-10-14: qty 250

## 2012-10-14 MED ORDER — MIDAZOLAM HCL 2 MG/2ML IJ SOLN
INTRAMUSCULAR | Status: AC
  Filled 2012-10-14: qty 2

## 2012-10-14 MED ORDER — FENTANYL CITRATE 0.05 MG/ML IJ SOLN
INTRAMUSCULAR | Status: AC
  Filled 2012-10-14: qty 2

## 2012-10-14 NOTE — Progress Notes (Signed)
TR BAND REMOVAL  LOCATION:    right radial  DEFLATED PER PROTOCOL:    yes  TIME BAND OFF / DRESSING APPLIED:    1730   SITE UPON ARRIVAL:    Level 0  SITE AFTER BAND REMOVAL:    Level 0  REVERSE ALLEN'S TEST:     positive  CIRCULATION SENSATION AND MOVEMENT:    Within Normal Limits   yes  COMMENTS:   Tolerated procedure well 

## 2012-10-14 NOTE — CV Procedure (Signed)
   CARDIAC CATH NOTE  Name: Levi [name] MRN: [id number] DOB: Jul 30, 1929  Procedure: PTCA and stenting of the proximal and mid-LAD  Indication: CCS Class 3 angina (dyspnea as anginal equivalent), progressive symptoms, s/p CABG with only one remaining patent graft.  Procedural Details: The right wrist was prepped, draped, and anesthetized with 1% lidocaine. Using the modified Seldinger technique, a 6 Fr sheath was introduced into the radial artery. 3 mg verapamil was administered through the radial sheath. Weight-based bivalirudin was given for anticoagulation. Once a therapeutic ACT was achieved, a 6 [place] XB-LAD 4cm guide catheter was inserted.  A Cougar coronary guidewire was used to cross the lesion.  The lesion was predilated with a 2.0x20 mm balloon.  The lesion was then stented with a 2.25x32 mm Promus Premier drug-eluting stent.  Because of long lesion length, a second stent was required to be placed in overlapping fashion over the proximal edge of the first stent. A 2.5x20 mm Promus was chosen. The stent was postdilated with a 2.5x20 mm noncompliant balloon to 16 atm throughout.  Following PCI, there was 0% residual stenosis and TIMI-3 flow. Final angiography confirmed an excellent result. The patient tolerated the procedure well. There were no immediate procedural complications. A TR band was used for radial hemostasis. The patient was transferred to the post catheterization recovery area for further monitoring.  Lesion Data: Vessel: LAD/prox and mid Percent stenosis (pre): 90 TIMI-flow (pre):  3 Stent:  2.25x32 mm Promus DES and 2.5x20 mm Promus DES Percent stenosis (post): 0 TIMI-flow (post): 3  Conclusions: Successful PCI of severe stenosis in the proximal and mid-LAD  Recommendations: ASA/plavix at least 12 months. Complex CAD with residual moderate left main, ostial LAD, and proximal LCx stenoses. Any further intervention would require extremely complex stenting of the distal  left main and I would recommend medical therapy as first-line approach for treatment of residual CAD. He has failed medical therapy up to now. Despite max meds (imdur, ranolazine, metoprolol), he continued to have severe symptoms. I did not perform FFR because I felt the calcification and severe diffuse disease would lead to a high-risk of wire dissection.  Tonny Bollman 10/14/2012, 2:01 PM

## 2012-10-14 NOTE — H&P (View-Only) (Signed)
HPI:  79 year old gentleman presenting for evaluation of coronary artery disease and chronic angina. The patient's had prior CABG. All of his vein grafts are occluded and his only remaining graft is a LIMA to diagonal. I have reviewed his cardiac cath films and he has moderately tight distal left main disease, severe diffuse proximal and mid LAD stenosis, moderate left circumflex stenosis, and moderate stenosis of the right coronary artery. He presents today to discuss potential revascularization option with PCI.  The patient is primarily limited by shortness of breath. He describes shortness of breath with low-level activity. This is been present for several years. He did not notice any improvement even early after his bypass surgery. He's had a few episodes of tightness in his chest associated diaphoresis. This is occurred 3 times in the last 2 months. He denies exertional chest tightness, chest pain, or lightheadedness. He denies orthopnea, PND, or leg swelling.  Outpatient Encounter Prescriptions as of 10/05/2012  Medication Sig Dispense Refill  . albuterol-ipratropium (COMBIVENT) 18-103 MCG/ACT inhaler Inhale 2 puffs into the lungs every 6 (six) hours as needed. For wheezing      . ALPRAZolam (XANAX) 0.25 MG tablet Take 1 tablet (0.25 mg total) by mouth 2 (two) times daily as needed for anxiety.  30 tablet  1  . aspirin 325 MG tablet Take 1/2 tab daily      . hydrochlorothiazide (HYDRODIURIL) 25 MG tablet Take 1 tablet (25 mg total) by mouth every morning.  90 tablet  1  . isosorbide mononitrate (IMDUR) 30 MG 24 hr tablet Take 1 tablet (30 mg total) by mouth daily.  30 tablet  10  . losartan (COZAAR) 50 MG tablet Take 50 mg by mouth at bedtime.      . metoprolol tartrate (LOPRESSOR) 25 MG tablet Take 1/2 tab twice a day      . nitroGLYCERIN (NITROSTAT) 0.4 MG SL tablet Place 1 tablet (0.4 mg total) under the tongue every 5 (five) minutes x 3 doses as needed for chest pain.  30 tablet  4  .  pravastatin (PRAVACHOL) 20 MG tablet Take 20 mg by mouth daily.      . ranolazine (RANEXA) 500 MG 12 hr tablet Take 1 tablet (500 mg total) by mouth 2 (two) times daily.  60 tablet  1  . fluticasone (FLONASE) 50 MCG/ACT nasal spray         Review of patient's allergies indicates no known allergies.  Past Medical History  Diagnosis Date  . HTN (hypertension)   . HLD (hyperlipidemia)   . Edema   . Benign neoplasm of colon   . Dyspnea on exertion   . Prostate cancer   . Osteopenia   . Emphysema   . Bronchitis   . Obesity   . Malaise and fatigue   . CAD (coronary artery disease) Dec 2011    s/p CABG per Dr. Cornelius Moras; had normal EF    Past Surgical History  Procedure Date  . Median sternotomy 08/23/10  . Coronary artery bypass graft 08/23/10  . Cervical disc surgery     History   Social History  . Marital Status: Married    Spouse Name: N/A    Number of Children: N/A  . Years of Education: N/A   Occupational History  . retired Chartered certified accountant    Social History Main Topics  . Smoking status: Former Games developer  . Smokeless tobacco: Not on file  . Alcohol Use: No  . Drug Use: No  . Sexually  Active: Not on file   Other Topics Concern  . Not on file   Social History Narrative  . No narrative on file    Family History  Problem Relation Age of Onset  . Heart disease Brother   . Skin cancer Brother   . Prostate cancer Brother   . Kidney cancer Father   . Heart attack Father     ROS:  Negative except as outlined in the history of present illness  BP 104/60  Pulse 48  Ht 5\' 10"  (1.778 m)  Wt 122.018 kg (269 lb)  BMI 38.60 kg/m2  PHYSICAL EXAM: Pt is alert and oriented, WD, WN, obese elderly male in no distress. HEENT: normal Neck: JVP normal. Carotid upstrokes normal without bruits. No thyromegaly. Lungs: equal expansion, clear bilaterally CV: Apex is discrete and nondisplaced, RRR without murmur or gallop Abd: soft, NT, +BS Back: no CVA tenderness Ext: no C/C/E          DP/PT pulses intact and = Skin: warm and dry without rash Neuro: CNII-XII intact             Strength intact = bilaterally  Cardiac catheterization: Hemodynamic Findings:  Ao: 147/68  LV: 132/19  Angiographic Findings:  Left main: Distal 60-70% stenosis.  Left Anterior Descending Artery: The ostium has 40-50% stenosis. The first diagonal branch is occluded. The diagonal branch fills from the LIMA graft. The proximal LAD prior to D1 takeoff has 80% stenosis followed by a small aneurysmal segment. Following the aneurysmal segment, there is a long segment of up to 80% stenosis. The moderate sized septal perforating branch has 99% ostial stenosis. The distal vessel is small with diffuse mild to moderate disease. SVG to LAD is known to be occluded at the ostium.  Circumflex Artery: This is a moderate sized vessel with diffuse disease. The proximal vessel has diffuse 50% stenosis. The OM branch has serial 60% lesions. The SVG to OM is known to be occluded.  Right Coronary Artery: Large dominant vessel with 30% proximal stenosis and mild plaque mid and distal. There is a small filling defect in the mid RCA likely representing a calcified nodule of plaque. This does not create hemodynamically significant stenosis. The posterolateral branch has 50% stenosis. The PDA is small and diffusely diseased. SVG to PDA is known to be occluded.  Left Ventricular Angiogram: LVEF 55%, no regional wall motion abnormalities  .  Impression:  1. Severe triple vessel CAD s/p 4 V CABG with only 1 patent bypass graft.  2. Severe disease in native LAD and PDA. Moderate disease in LCx.  3. Preserved LV systolic function.  Recommendations: There is moderate (60%) stenosis in the distal LM and complex severe disease in the proximal LAD around D1. The LAD disease could potentially be treated interventionally but would be difficult. Will start Imdur and ambulate patient. If he remains symptomatic, will consider intervention  to LAD. Discussed with Dr. Excell Seltzer.   ASSESSMENT AND PLAN: Severe three-vessel coronary artery disease status post CABG with class III symptoms, primarily dyspnea. I have carefully reviewed the patient's cardiac catheterization studies. Images were reviewed in depth with the patient and his family. I think it is technically feasible to treat his LAD with percutaneous intervention. The procedure would be complex because of his distal left main disease which is moderate by my estimate. He also has long segment stenosis of the LAD. The diagonal branch is the only protected vessel. I reviewed these technical issues, but I believe the procedure  would have a high likelihood of success and the risk of major complication such as myocardial infarction or death would be in the 1-2% range.  My biggest concern is whether this patient's shortness of breath is related to coronary ischemia. He is obese and sedentary and I suspect these are the primary reasons for his dyspnea. He understands that PCI may not improve his breathing, but certainly could improve his symptoms of angina. He would like to think over his options a bit more and will contact me if he would like to proceed. In the meantime he will remain on his current medical program and will followup as scheduled with Dr. Tenny Craw.  Greater than 45 minutes was spent in discussion with the patient and his family today.  Tonny Bollman 10/05/2012 9:20 AM

## 2012-10-14 NOTE — Interval H&P Note (Signed)
History and Physical Interval Note:  10/14/2012 12:41 PM  Levi [name]  has presented today for surgery, with the diagnosis of pci lad  The various methods of treatment have been discussed with the patient and family. After consideration of risks, benefits and other options for treatment, the patient has consented to  Procedure(s): PERCUTANEOUS CORONARY STENT INTERVENTION (PCI-S) (N/A) as a surgical intervention .  The patient's history has been reviewed, patient examined, no change in status, stable for surgery.  I have reviewed the patient's chart and labs.  Questions were answered to the patient's satisfaction.     Tonny Bollman

## 2012-10-15 ENCOUNTER — Encounter (HOSPITAL_COMMUNITY): Payer: Self-pay | Admitting: *Deleted

## 2012-10-15 DIAGNOSIS — E785 Hyperlipidemia, unspecified: Secondary | ICD-10-CM

## 2012-10-15 DIAGNOSIS — I2510 Atherosclerotic heart disease of native coronary artery without angina pectoris: Secondary | ICD-10-CM

## 2012-10-15 DIAGNOSIS — I10 Essential (primary) hypertension: Secondary | ICD-10-CM

## 2012-10-15 DIAGNOSIS — I209 Angina pectoris, unspecified: Secondary | ICD-10-CM | POA: Diagnosis present

## 2012-10-15 LAB — CBC
HCT: 39.7 % (ref 39.0–52.0)
Hemoglobin: 13.7 g/dL (ref 13.0–17.0)
MCHC: 34.5 g/dL (ref 30.0–36.0)
MCV: 89.8 fL (ref 78.0–100.0)
RDW: 13.2 % (ref 11.5–15.5)
WBC: 7.3 10*3/uL (ref 4.0–10.5)

## 2012-10-15 LAB — BASIC METABOLIC PANEL
BUN: 18 mg/dL (ref 6–23)
Chloride: 100 mEq/L (ref 96–112)
Creatinine, Ser: 1.12 mg/dL (ref 0.50–1.35)
GFR calc Af Amer: 69 mL/min — ABNORMAL LOW (ref >90)
Glucose, Bld: 103 mg/dL — ABNORMAL HIGH (ref 70–99)

## 2012-10-15 MED ORDER — ASPIRIN 81 MG PO CHEW
162.0000 mg | CHEWABLE_TABLET | Freq: Every day | ORAL | Status: DC
  Administered 2012-10-15: 10:00:00 162 mg via ORAL

## 2012-10-15 MED ORDER — POTASSIUM CHLORIDE CRYS ER 20 MEQ PO TBCR
40.0000 meq | EXTENDED_RELEASE_TABLET | Freq: Once | ORAL | Status: AC
  Administered 2012-10-15: 13:00:00 40 meq via ORAL
  Filled 2012-10-15: qty 2

## 2012-10-15 MED FILL — Dextrose Inj 5%: INTRAVENOUS | Qty: 1000 | Status: AC

## 2012-10-15 NOTE — Discharge Instructions (Signed)
PLEASE REMEMBER TO BRING ALL OF YOUR MEDICATIONS TO EACH OF YOUR FOLLOW-UP OFFICE VISITS.  PLEASE ATTEND ALL SCHEDULED FOLLOW-UP APPOINTMENTS.   Activity: Increase activity slowly as tolerated. You may shower, but no soaking baths (or swimming) for 1 week. No driving for 2 days. No lifting over 5 lbs for 1 week. No sexual activity for 1 week.   You May Return to Work: in 1 week (if applicable)  Wound Care: You may wash cath site gently with soap and water. Keep cath site clean and dry. If you notice pain, swelling, bleeding or pus at your cath site, please call [phone].    Cardiac Cath Site Care Refer to this sheet in the next few weeks. These instructions provide you with information on caring for yourself after your procedure. Your caregiver may also give you more specific instructions. Your treatment has been planned according to current medical practices, but problems sometimes occur. Call your caregiver if you have any problems or questions after your procedure. HOME CARE INSTRUCTIONS  You may shower 24 hours after the procedure. Remove the bandage (dressing) and gently wash the site with plain soap and water. Gently pat the site dry.   Do not apply powder or lotion to the site.   Do not sit in a bathtub, swimming pool, or whirlpool for 5 to 7 days.   No bending, squatting, or lifting anything over 10 pounds (4.5 kg) as directed by your caregiver.   Inspect the site at least twice daily.   Do not drive home if you are discharged the same day of the procedure. Have someone else drive you.   You may drive 24 hours after the procedure unless otherwise instructed by your caregiver.  What to expect:  Any bruising will usually fade within 1 to 2 weeks.   Blood that collects in the tissue (hematoma) may be painful to the touch. It should usually decrease in size and tenderness within 1 to 2 weeks.  SEEK IMMEDIATE MEDICAL CARE IF:  You have unusual pain at the site or down the  affected limb.   You have redness, warmth, swelling, or pain at the site.   You have drainage (other than a small amount of blood on the dressing).   You have chills.   You have a fever or persistent symptoms for more than 72 hours.   You have a fever and your symptoms suddenly get worse.   Your leg becomes pale, cool, tingly, or numb.   You have heavy bleeding from the site. Hold pressure on the site.  Document Released: 09/21/2010 Document Revised: 08/08/2011 Document Reviewed: 09/21/2010 [facility] Patient Information 2012 [place], [place]. Coronary Angiography with Stent This is a procedure to widen or open a narrow blood vessel of the heart (coronary artery). When a coronary artery becomes partially blocked it decreases blood flow to that area. This may lead to chest pain or a heart attack (myocardial infarction). Arteries may become blocked by cholesterol buildup (plaque) in the lining or wall. A stent is a small piece of metal that looks like a mesh or a spring. Stent placement may be done right after an angiogram that finds a blocked artery or as a treatment for a heart attack. RISKS AND COMPLICATIONS  Damage to the heart.  A blockage may return.  Bleeding at the site.  Blood clot to another part of the body. PROCEDURE  You may be given a medication to help you relax before and during the procedure through an  IV in your hand or arm.  A local anesthetic to make the area numb may be used before inserting the catheter (a long, hollow tube about the size of a piece of cooked spaghetti).  You will be prepared for the procedure by washing and shaving the area where the catheter will be inserted. This is usually done in the groin.  A specially trained doctor will insert the catheter with a guide wire into an artery. This is guided under a special type of X-ray (fluoroscopy) to the opening of the blocked artery.  Special dye is then injected and X-rays are taken.  A tiny wire is  guided to the blocked spot and a balloon is inflated to make the artery wider. The stent is expanded and crushes the plaque into the wall of the vessel. The stent holds the area open like a scaffolding and improves the blood flow.  Sometimes the artery may be made wider using a laser or other tools to remove plaque.  When the blood flow is better, the catheter is removed. The lining of the artery will grow over the stent which stays where it was placed. AFTER THE PROCEDURE  You will stay in bed for several hours.  The access site will be watched and you will be checked frequently.  Blood tests, X-rays and an EKG may be done.  You may stay in the hospital overnight for observation. SEEK IMMEDIATE MEDICAL CARE IF:   You develop chest pain, shortness of breath, feel faint, or pass out.  There is bleeding, swelling, or drainage from the catheter insertion site.  You develop pain, discoloration, coldness, or severe bruising in the leg or arm that held the catheter.  You see blood in your urine or stool. This may be bright red blood in urine or stools, or also appear as black, tarry stools.  You have a fever. Document Released: 02/23/2003 Document Revised: 11/11/2011 Document Reviewed: 10/16/2007 [facility] Patient Information 2013 [place], [place]. Radial Site Care Refer to this sheet in the next few weeks. These instructions provide you with information on caring for yourself after your procedure. Your caregiver may also give you more specific instructions. Your treatment has been planned according to current medical practices, but problems sometimes occur. Call your caregiver if you have any problems or questions after your procedure. HOME CARE INSTRUCTIONS  You may shower the day after the procedure.Remove the bandage (dressing) and gently wash the site with plain soap and water.Gently pat the site dry.  Do not apply powder or lotion to the site.  Do not submerge the affected site in  water for 3 to 5 days.  Inspect the site at least twice daily.  Do not flex or bend the affected arm for 24 hours.  No lifting over 5 pounds (2.3 kg) for 5 days after your procedure.  Do not drive home if you are discharged the same day of the procedure. Have someone else drive you.  You may drive 24 hours after the procedure unless otherwise instructed by your caregiver.  Do not operate machinery or power tools for 24 hours.  A responsible adult should be with you for the first 24 hours after you arrive home. What to expect:  Any bruising will usually fade within 1 to 2 weeks.  Blood that collects in the tissue (hematoma) may be painful to the touch. It should usually decrease in size and tenderness within 1 to 2 weeks. SEEK IMMEDIATE MEDICAL CARE IF:  You have unusual  pain at the radial site.  You have redness, warmth, swelling, or pain at the radial site.  You have drainage (other than a small amount of blood on the dressing).  You have chills.  You have a fever or persistent symptoms for more than 72 hours.  You have a fever and your symptoms suddenly get worse.  Your arm becomes pale, cool, tingly, or numb.  You have heavy bleeding from the site. Hold pressure on the site. Document Released: 09/21/2010 Document Revised: 11/11/2011 Document Reviewed: 09/21/2010 [facility] Patient Information 2013 [place], [place].

## 2012-10-15 NOTE — Progress Notes (Signed)
Pt noted to have periods of sleep apnea up to 20 sec, O2 sat 94-97% on ra.  Pt awakens easily, in no apparent distress.  SB 50's.  Daughter at bedside.

## 2012-10-15 NOTE — Progress Notes (Signed)
Subjective:  No CP or SOB  Has been walking   Objective:  Vital Signs in the last 24 hours: Temp:  [97.5 F (36.4 C)-98.4 F (36.9 C)] 97.8 F (36.6 C) (02/13 0745) Pulse Rate:  [49-56] 53 (02/13 0502) Resp:  [15-22] 17 (02/13 0502) BP: (109-131)/(52-71) 125/71 mmHg (02/13 0502) SpO2:  [96 %-100 %] 97 % (02/13 0502) Weight:  [266 lb 15.6 oz (121.1 kg)-269 lb (122.018 kg)] 266 lb 15.6 oz (121.1 kg) (02/13 0015)  Intake/Output from previous day: 02/12 0701 - 02/13 0700 In: 1358.4 [P.O.:360; I.V.:998.4] Out: 375 [Urine:375] Intake/Output from this shift:    Physical Exam: Well appearing NAD HEENT: Unremarkable Neck:  No JVD, Lungs:  Clear HEART:  Regular rate rhythm, no murmurs, no rubs, no clicks  Ext:  2 plus pulses, no edema, no cyanosis, no clubbing  R wrist without hematoma  Tele:  SR  Lab Results:  Recent Labs  10/14/12 0953 10/15/12 0530  WBC 8.2 7.3  HGB 14.1 13.7  PLT 239 206    Recent Labs  10/14/12 0953 10/15/12 0530  NA 140 136  K 3.8 3.4*  CL 101 100  CO2 27 23  GLUCOSE 94 103*  BUN 20 18  CREATININE 1.18 1.12   No results found for this basename: TROPONINI, CK, MB,  in the last 72 hours Hepatic Function Panel No results found for this basename: PROT, ALBUMIN, AST, ALT, ALKPHOS, BILITOT, BILIDIR, IBILI,  in the last 72 hours No results found for this basename: CHOL,  in the last 72 hours No results found for this basename: PROTIME,  in the last 72 hours  Imaging: No results found.  Cardiac Studies:  Assessment/Plan:   1.  CAD  Patient s/p intervention to the LAD with PTCA/PROMUS stent x2    Doing well.  Continue plavix.  D/C home today.  Continue other meds.  F/U in clinic in 2 to 3 wks.  2.  HL  Continue statin  3.  HTN  Adequate control.  LOS: 1 day    Lewayne Bunting 10/15/2012, 9:13 AM

## 2012-10-15 NOTE — Progress Notes (Signed)
CARDIAC REHAB PHASE I   PRE:  Rate/Rhythm: 58 SB    BP: sitting 125/73    SaO2: 97 RA  MODE:  Ambulation: 580 ft   POST:  Rate/Rhythm: 72 SR    BP: sitting 139/66     SaO2:   Tolerated fairly well. Steady. Thinks his SOB is some better although did need to rest after going up incline and 300 ft. No major c/o. Ed completed. Not interested in CRPII. Sts he tried it after CABG and couldn't tolerate ex. Will walk as tolerated at home. Gave guidelines for increased frequency, shorter duration. [id number]  Harriet Masson CES, ACSM

## 2012-10-15 NOTE — Discharge Summary (Signed)
CARDIOLOGY DISCHARGE SUMMARY   Patient ID: Levi [name] MRN: [id number] DOB/AGE: 77-Nov-1930 77 y.o.  Admit date: 10/14/2012 Discharge date: 10/15/2012  Primary Discharge Diagnosis:     Angina, class III, s/p  2.25x32 mm Promus DES and 2.5x20 mm Promus DES to the proximal and mid LAD  Secondary Discharge Diagnosis:    HYPERLIPIDEMIA   HYPERTENSION  Procedures:  PTCA and stenting of the proximal and mid-LAD  Hospital Course:  Mr. Toner is an 79 year old male with a history of coronary artery disease. He came in with unstable angina in December of 2013 and had a cardiac catheterization. Initially, medical therapy was recommended. However, in an office visit on every third 2014, Mr. Tonny Branch was still having significant dyspnea on exertion, his anginal equivalent. Dr. Excell Seltzer reviewed his cath films and felt percutaneous intervention might help. He was scheduled for outpatient percutaneous intervention and came to the hospital for the procedure on 10/14/2012.  He had the procedure described below. He tolerated the procedure well. He was held overnight. His blood pressure was monitored and remained stable. His blood pressure is under good control. He was continued on nitrates, Ranexa and low-dose beta blocker as well as a statin. His potassium was slightly low, possibly secondary to hydration for the procedure, and was supplemented.  On 10/15/2012, he was seen by Dr. Excell Seltzer and by cardiac rehabilitation. His cath site was stable. He was ambulating without chest pain or shortness of breath and considered stable for discharge, to follow up as an outpatient.  Labs:   Lab Results  Component Value Date   WBC 7.3 10/15/2012   HGB 13.7 10/15/2012   HCT 39.7 10/15/2012   MCV 89.8 10/15/2012   PLT 206 10/15/2012     Recent Labs Lab 10/15/12 0530  NA 136  K 3.4*  CL 100  CO2 23  BUN 18  CREATININE 1.12  CALCIUM 8.8  GLUCOSE 103*    Recent Labs  10/14/12 0953  INR 1.02     Cardiac  Cath: 10/15/2012 Lesion Data:  Vessel: LAD/prox and mid  Percent stenosis (pre): 90  TIMI-flow (pre): 3  Stent: 2.25x32 mm Promus DES and 2.5x20 mm Promus DES  Percent stenosis (post): 0  TIMI-flow (post): 3  EKG: 15-Oct-2012 05:07:47    Sinus bradycardia with 1st degree A-V block Right bundle branch block Minimal voltage criteria for LVH, may be normal variant Lateral infarct , age undetermined Abnormal ECG 79mm/s 85mm/mV 100Hz  8.0.1 12SL 241 HD CID: 1 Referred by: MICHAEL COOPER Confirmed By: Orpah Cobb MD Vent. rate 52 BPM PR interval 224 ms QRS duration 154 ms QT/QTc 494/459 ms P-R-T axes 46 -20 32  FOLLOW UP PLANS AND APPOINTMENTS No Known Allergies   Medication List    TAKE these medications       albuterol-ipratropium 18-103 MCG/ACT inhaler  Commonly known as:  COMBIVENT  Inhale 2 puffs into the lungs every 6 (six) hours as needed. For wheezing     ALPRAZolam 0.25 MG tablet  Commonly known as:  XANAX  Take 1 tablet (0.25 mg total) by mouth 2 (two) times daily as needed for anxiety.     aspirin 325 MG tablet  Take 162.5 mg by mouth daily. Take 1/2 tab daily     clopidogrel 75 MG tablet  Commonly known as:  PLAVIX  Take 1 tablet (75 mg total) by mouth daily.     fluticasone 50 MCG/ACT nasal spray  Commonly known as:  FLONASE  Place 1 spray into  the nose daily as needed. For allergies     hydrochlorothiazide 25 MG tablet  Commonly known as:  HYDRODIURIL  Take 1 tablet (25 mg total) by mouth every morning.     isosorbide mononitrate 30 MG 24 hr tablet  Commonly known as:  IMDUR  Take 1 tablet (30 mg total) by mouth daily.     losartan 50 MG tablet  Commonly known as:  COZAAR  Take 50 mg by mouth at bedtime.     metoprolol tartrate 25 MG tablet  Commonly known as:  LOPRESSOR  Take 12.5 mg by mouth 2 (two) times daily.     nitroGLYCERIN 0.4 MG SL tablet  Commonly known as:  NITROSTAT  Place 1 tablet (0.4 mg total) under the tongue every 5 (five)  minutes x 3 doses as needed for chest pain.     pravastatin 20 MG tablet  Commonly known as:  PRAVACHOL  Take 20 mg by mouth daily.     ranolazine 500 MG 12 hr tablet  Commonly known as:  RANEXA  Take 1 tablet (500 mg total) by mouth 2 (two) times daily.        Discharge Orders   Future Orders Complete By Expires     Diet - low sodium heart healthy  As directed     Increase activity slowly  As directed       Follow-up Information   Follow up with Dietrich Pates, MD. (The office will call.)    Contact information:   [address] [place] [address] [phone]       BRING ALL MEDICATIONS WITH YOU TO FOLLOW UP APPOINTMENTS  Time spent with patient to include physician time: 34 min Signed: Theodore Demark 10/15/2012, 2:23 PM Co-Sign MD

## 2012-10-15 NOTE — Progress Notes (Signed)
Utilization Review Completed Corryn Madewell J. Noela Brothers, RN, BSN, NCM [phone]  

## 2012-10-19 MED FILL — Dextrose Inj 5%: INTRAVENOUS | Qty: 50 | Status: AC

## 2012-10-20 ENCOUNTER — Ambulatory Visit (INDEPENDENT_AMBULATORY_CARE_PROVIDER_SITE_OTHER): Payer: Medicare Other | Admitting: Nurse Practitioner

## 2012-10-20 ENCOUNTER — Encounter: Payer: Self-pay | Admitting: Nurse Practitioner

## 2012-10-20 VITALS — BP 90/62 | HR 56 | Ht 70.0 in | Wt 264.4 lb

## 2012-10-20 DIAGNOSIS — I2510 Atherosclerotic heart disease of native coronary artery without angina pectoris: Secondary | ICD-10-CM

## 2012-10-20 DIAGNOSIS — Z9861 Coronary angioplasty status: Secondary | ICD-10-CM

## 2012-10-20 DIAGNOSIS — Z955 Presence of coronary angioplasty implant and graft: Secondary | ICD-10-CM

## 2012-10-20 LAB — BASIC METABOLIC PANEL
BUN: 26 mg/dL — ABNORMAL HIGH (ref 6–23)
CO2: 28 mEq/L (ref 19–32)
Calcium: 9.1 mg/dL (ref 8.4–10.5)
Chloride: 102 mEq/L (ref 96–112)
Creatinine, Ser: 1.5 mg/dL (ref 0.4–1.5)
GFR: 48.41 mL/min — ABNORMAL LOW (ref >60.00)
Glucose, Bld: 92 mg/dL (ref 70–99)
Potassium: 3.9 mEq/L (ref 3.5–5.1)
Sodium: 137 mEq/L (ref 135–145)

## 2012-10-20 NOTE — Patient Instructions (Addendum)
Stay on your current medicines except stop the HCTZ  Monitor your blood pressure at home and keep a diary of your BP and your heart rates  We need to check lab today (BMET)  I will see you in 2 weeks on a day that Dr. Excell Seltzer is here  Try to be as active as possible  Call the [facility] Care office at [phone] if you have any questions, problems or concerns.

## 2012-10-20 NOTE — Progress Notes (Signed)
Levi [name] Date of Birth: 29-Dec-1928 Medical Record #[id number]  History of Present Illness: Levi [name] is seen back today for a post hospital visit. He is seen for Dr. Riccardo Dubin. He has known CAD with prior CABG in 2011. All of his vein grafts are occluded and only remaining graft is a LIMA to the DX. His other issues include HLD, HTN and COPD. He has chronic dyspnea. He has preserved LV function.   He most recently had cath back in December and medical therapy was recommended. However his symptoms persisted and he met with Dr. Excell Seltzer and they decided to proceed on with PTCA and stenting of the proximal and mid LAD. This was done last week. He remains on medical therapy as well with nitrates and Ranexa.   He comes back today. He is here with a family member. He is doing ok. Hard for him to say if he is feeling better. Has good days and bad days. Yesterday, he was able to walk more without stopping to catch his breath. This morning he had to stop several times just with trying to get the paper at the end of the drive way. No chest pain - seems like he has never had chest pain. BP is running low at home. HR in the mid 50's at home. Feels a little lightheaded at times. No syncope. He is not sure if his breathing is any better. No problems with his arm.   Current Outpatient Prescriptions on File Prior to Visit  Medication Sig Dispense Refill  . albuterol-ipratropium (COMBIVENT) 18-103 MCG/ACT inhaler Inhale 2 puffs into the lungs every 6 (six) hours as needed. For wheezing      . ALPRAZolam (XANAX) 0.25 MG tablet Take 1 tablet (0.25 mg total) by mouth 2 (two) times daily as needed for anxiety.  30 tablet  1  . aspirin 325 MG tablet Take 162.5 mg by mouth daily. Take 1/2 tab daily      . clopidogrel (PLAVIX) 75 MG tablet Take 1 tablet (75 mg total) by mouth daily.  30 tablet  11  . fluticasone (FLONASE) 50 MCG/ACT nasal spray Place 1 spray into the nose daily as needed. For allergies      .  isosorbide mononitrate (IMDUR) 30 MG 24 hr tablet Take 1 tablet (30 mg total) by mouth daily.  30 tablet  10  . losartan (COZAAR) 50 MG tablet Take 50 mg by mouth at bedtime.      . metoprolol tartrate (LOPRESSOR) 25 MG tablet Take 12.5 mg by mouth 2 (two) times daily.       . nitroGLYCERIN (NITROSTAT) 0.4 MG SL tablet Place 1 tablet (0.4 mg total) under the tongue every 5 (five) minutes x 3 doses as needed for chest pain.  30 tablet  4  . pravastatin (PRAVACHOL) 20 MG tablet Take 20 mg by mouth daily.      . ranolazine (RANEXA) 500 MG 12 hr tablet Take 1 tablet (500 mg total) by mouth 2 (two) times daily.  60 tablet  1   No current facility-administered medications on file prior to visit.    No Known Allergies  Past Medical History  Diagnosis Date  . HTN (hypertension)   . HLD (hyperlipidemia)   . Edema   . Benign neoplasm of colon   . Dyspnea on exertion   . Prostate cancer   . Osteopenia   . Emphysema   . Bronchitis   . Obesity   . Malaise and fatigue   .  CAD (coronary artery disease) Dec 2011    s/p CABG per Dr. Cornelius Moras; had normal EF; All SVGs occluded per follow up cath with only LIMA to LAD patent; s/p PCI of the proximal and mid LAD February 2014 per Dr. Excell Seltzer    Past Surgical History  Procedure Laterality Date  . Median sternotomy  08/23/10  . Coronary artery bypass graft  08/23/10  . Cervical disc surgery    . Cardiac catheterization  10/14/12  . Coronary stent placement  10/14/12    with stent to proximal and mid LAD    History  Smoking status  . Former Smoker  Smokeless tobacco  . Not on file    History  Alcohol Use No    Family History  Problem Relation Age of Onset  . Heart disease Brother   . Skin cancer Brother   . Prostate cancer Brother   . Kidney cancer Father   . Heart attack Father     Review of Systems: The review of systems is per the HPI.  All other systems were reviewed and are negative.  Physical Exam: BP 90/62  Pulse 56  Ht 5'  10" (1.778 m)  Wt 264 lb 6.4 oz (119.931 kg)  BMI 37.94 kg/m2 Patient is very pleasant and in no acute distress. He is obese. Skin is warm and dry. Color is normal.  HEENT is unremarkable. Normocephalic/atraumatic. PERRL. Sclera are nonicteric. Neck is supple. No masses. No JVD. Lungs are clear. Cardiac exam shows a regular rate and rhythm. Rate is 52 by me. Abdomen is obese but soft. Extremities are without edema. Gait and ROM are intact. No gross neurologic deficits noted.   LABORATORY DATA: BMET is pending  Lab Results  Component Value Date   WBC 7.3 10/15/2012   HGB 13.7 10/15/2012   HCT 39.7 10/15/2012   PLT 206 10/15/2012   GLUCOSE 103* 10/15/2012   CHOL  Value: 152        ATP III CLASSIFICATION:  <200     mg/dL   Desirable  161-096  mg/dL   Borderline High  >=045    mg/dL   High        40/98/1191   TRIG 253* 08/21/2010   HDL 30* 08/21/2010   LDLCALC  Value: 71        Total Cholesterol/HDL:CHD Risk Coronary Heart Disease Risk Table                     Men   Women  1/2 Average Risk   3.4   3.3  Average Risk       5.0   4.4  2 X Average Risk   9.6   7.1  3 X Average Risk  23.4   11.0        Use the calculated Patient Ratio above and the CHD Risk Table to determine the patient's CHD Risk.        ATP III CLASSIFICATION (LDL):  <100     mg/dL   Optimal  478-295  mg/dL   Near or Above                    Optimal  130-159  mg/dL   Borderline  621-308  mg/dL   High  >657     mg/dL   Very High 84/69/6295   ALT 12 08/31/2012   AST 16 08/31/2012   NA 136 10/15/2012   K 3.4* 10/15/2012   CL 100 10/15/2012  CREATININE 1.12 10/15/2012   BUN 18 10/15/2012   CO2 23 10/15/2012   TSH 3.68 09/25/2011   INR 1.02 10/14/2012   HGBA1C  Value: 5.7 (NOTE)                                                                       According to the ADA Clinical Practice Recommendations for 2011, when HbA1c is used as a screening test:   >=6.5%   Diagnostic of Diabetes Mellitus           (if abnormal result  is confirmed)   5.7-6.4%   Increased risk of developing Diabetes Mellitus  References:Diagnosis and Classification of Diabetes Mellitus,Diabetes Care,2011,34(Suppl 1):S62-S69 and Standards of Medical Care in         Diabetes - 2011,Diabetes Care,2011,34  (Suppl 1):S11-S61.* 08/20/2010    Lesion Data:  Vessel: LAD/prox and mid  Percent stenosis (pre): 90  TIMI-flow (pre): 3  Stent: 2.25x32 mm Promus DES and 2.5x20 mm Promus DES  Percent stenosis (post): 0  TIMI-flow (post): 3   Conclusions: Successful PCI of severe stenosis in the proximal and mid-LAD   Recommendations: ASA/plavix at least 12 months. Complex CAD with residual moderate left main, ostial LAD, and proximal LCx stenoses. Any further intervention would require extremely complex stenting of the distal left main and I would recommend medical therapy as first-line approach for treatment of residual CAD. He has failed medical therapy up to now. Despite max meds (imdur, ranolazine, metoprolol), he continued to have severe symptoms. I did not perform FFR because I felt the calcification and severe diffuse disease would lead to a high-risk of wire dissection.  Tonny Bollman  10/14/2012, 2:01 PM   Assessment / Plan:  1. CAD - with recent PCI to the proximal and mid LAD -  I think he improved but difficult to quantify. On Plavix for at least 12 months. Could consider increasing the Ranexa to 1000 mg BID in the future - cost of the Ranexa may be an issue in the future as well.   2. HTN - BP is running low. I have stopped the HCTZ and he will monitor at home. He will bring his readings in for review.   3. HLD - on statin therapy  I will see him back in about 2 weeks on a day that Dr. Excell Seltzer is here. Stopping the HCTZ today. Check BMET today. Encouraged activity.   Patient is agreeable to this plan and will call if any problems develop in the interim.

## 2012-10-21 NOTE — Telephone Encounter (Signed)
New Problem    Pt has some questions regarding the medication he is currently on.

## 2012-10-21 NOTE — Telephone Encounter (Signed)
Verified pt current medications.

## 2012-10-22 DIAGNOSIS — Z951 Presence of aortocoronary bypass graft: Secondary | ICD-10-CM | POA: Insufficient documentation

## 2012-11-06 ENCOUNTER — Ambulatory Visit: Payer: Medicare Other | Admitting: Nurse Practitioner

## 2012-11-10 ENCOUNTER — Encounter: Payer: Self-pay | Admitting: Cardiovascular Disease

## 2012-11-10 ENCOUNTER — Ambulatory Visit (INDEPENDENT_AMBULATORY_CARE_PROVIDER_SITE_OTHER): Payer: Medicare Other | Admitting: Cardiovascular Disease

## 2012-11-10 VITALS — BP 160/82 | HR 52 | Ht 70.0 in | Wt 272.8 lb

## 2012-11-10 DIAGNOSIS — I2510 Atherosclerotic heart disease of native coronary artery without angina pectoris: Secondary | ICD-10-CM

## 2012-11-10 MED ORDER — FUROSEMIDE 20 MG PO TABS: 20.0000 mg | ORAL_TABLET | Freq: Every day | ORAL | Status: DC

## 2012-11-10 NOTE — Progress Notes (Signed)
HPI:  79 year old woman presenting for followup evaluation. He has coronary artery disease with prior CABG. All of his vein grafts are occluded and is last remaining graft is a LIMA to diagonal. He recent he underwent coronary stenting of the LAD. His primary symptom has been shortness of breath with exertion. He continues to have shortness of breath with low to moderate level activity. This has not significantly changed since his PCI procedure. He's had no chest pain or pressure. He has mild leg swelling. He denies orthopnea or PND. He has no other complaints today. He's been compliant with his medical program.  Outpatient Encounter Prescriptions as of 11/10/2012  Medication Sig Dispense Refill  . albuterol-ipratropium (COMBIVENT) 18-103 MCG/ACT inhaler Inhale 2 puffs into the lungs every 6 (six) hours as needed. For wheezing      . ALPRAZolam (XANAX) 0.25 MG tablet Take 1 tablet (0.25 mg total) by mouth 2 (two) times daily as needed for anxiety.  30 tablet  1  . aspirin 325 MG tablet Take 162.5 mg by mouth daily. Take 1/2 tab daily      . clopidogrel (PLAVIX) 75 MG tablet Take 1 tablet (75 mg total) by mouth daily.  30 tablet  11  . fluticasone (FLONASE) 50 MCG/ACT nasal spray Place 1 spray into the nose daily as needed. For allergies      . isosorbide mononitrate (IMDUR) 30 MG 24 hr tablet Take 1 tablet (30 mg total) by mouth daily.  30 tablet  10  . losartan (COZAAR) 50 MG tablet Take 50 mg by mouth at bedtime.      . metoprolol tartrate (LOPRESSOR) 25 MG tablet Take 12.5 mg by mouth 2 (two) times daily.       . nitroGLYCERIN (NITROSTAT) 0.4 MG SL tablet Place 1 tablet (0.4 mg total) under the tongue every 5 (five) minutes x 3 doses as needed for chest pain.  30 tablet  4  . pravastatin (PRAVACHOL) 20 MG tablet Take 20 mg by mouth daily.      . [DISCONTINUED] ranolazine (RANEXA) 500 MG 12 hr tablet Take 1 tablet (500 mg total) by mouth 2 (two) times daily.  60 tablet  1  . furosemide (LASIX) 20  MG tablet Take 1 tablet (20 mg total) by mouth daily.  90 tablet  3   No facility-administered encounter medications on file as of 11/10/2012.    No Known Allergies  Past Medical History  Diagnosis Date  . HTN (hypertension)   . HLD (hyperlipidemia)   . Edema   . Benign neoplasm of colon   . Dyspnea on exertion   . Prostate cancer   . Osteopenia   . Emphysema   . Bronchitis   . Obesity   . Malaise and fatigue   . CAD (coronary artery disease) Dec 2011    s/p CABG per Dr. Cornelius Moras; had normal EF; All SVGs occluded per follow up cath with only LIMA to LAD patent; s/p PCI of the proximal and mid LAD February 2014 per Dr. Excell Seltzer    ROS: Negative except as per HPI  BP 160/82  Pulse 52  Ht 5\' 10"  (1.778 m)  Wt 123.741 kg (272 lb 12.8 oz)  BMI 39.14 kg/m2  PHYSICAL EXAM: Pt is alert and oriented, pleasant elderly male in NAD HEENT: normal Neck: JVP - normal, carotids 2+= without bruits Lungs: CTA bilaterally CV: RRR without murmur or gallop Abd: soft, NT, Positive BS, obese Ext: no C/C/E, distal pulses intact and equal  Skin: warm/dry no rash  ASSESSMENT AND PLAN: 1. Coronary artery disease, native vessel. The patient is stable. He will continue on dual antiplatelet therapy. He is not having anginal symptoms. He wondered about continuing Ranexa and I thought it would be reasonable to stop this medication. He has only taken it by samples from the office and has never received a prescription from the pharmacy.  2. Edema. Probably multifactorial. He has previously been on Lasix and his edema was better at that time. Will start furosemide 20 mg daily.  3. Hypertension. Home blood pressures have been recorded. He's had a few elevated readings, but the majority of blood pressures have been in the 120s and 130s systolic. There have been a few blood pressures in the 140 and 150s. He will continue on a combination of isosorbide, losartan, and metoprolol.  For followup, we will set him up  to see Dr. Tenny Craw in about 3 months.  Tonny Bollman 11/10/2012 5:40 PM

## 2012-11-10 NOTE — Patient Instructions (Addendum)
Your physician recommends that you schedule a follow-up appointment in: 3 MONTHS with Dr Dietrich Pates  Your physician has recommended you make the following change in your medication: STOP Ranexa, START Furosemide 20mg  take one by mouth daily

## 2013-02-01 ENCOUNTER — Ambulatory Visit (INDEPENDENT_AMBULATORY_CARE_PROVIDER_SITE_OTHER): Payer: Medicare Other | Admitting: Internal Medicine

## 2013-02-01 ENCOUNTER — Encounter: Payer: Self-pay | Admitting: Internal Medicine

## 2013-02-01 VITALS — BP 124/52 | HR 50

## 2013-02-01 DIAGNOSIS — L989 Disorder of the skin and subcutaneous tissue, unspecified: Secondary | ICD-10-CM

## 2013-02-01 DIAGNOSIS — I10 Essential (primary) hypertension: Secondary | ICD-10-CM

## 2013-02-01 LAB — CBC
HCT: 40.9 % (ref 39.0–52.0)
Hemoglobin: 13.9 g/dL (ref 13.0–17.0)
Platelets: 207 10*3/uL (ref 150.0–400.0)
RBC: 4.61 Mil/uL (ref 4.22–5.81)
WBC: 8.9 10*3/uL (ref 4.5–10.5)

## 2013-02-01 LAB — BASIC METABOLIC PANEL
Chloride: 98 mEq/L (ref 96–112)
Potassium: 3.5 mEq/L (ref 3.5–5.1)

## 2013-02-01 NOTE — Progress Notes (Signed)
HPI 79 year old woman presenting for followup evaluation. He has coronary artery disease with prior CABG. All of his vein grafts are occluded and is last remaining graft is a LIMA to diagonal. He underwent PCI/DES to  LAD in Feb  He was seen by Levi [name] in clinic in March. Since he was seen then he says he heas done well.  Breathing is OK  No Cp  Staying active\Has small area of erythemia in upper chest that he has had since admit.  Bothers a little  No Known Allergies  Current Outpatient Prescriptions  Medication Sig Dispense Refill  . ALPRAZolam (XANAX) 0.25 MG tablet Take 1 tablet (0.25 mg total) by mouth 2 (two) times daily as needed for anxiety.  30 tablet  1  . aspirin 325 MG tablet Take 162.5 mg by mouth daily. Take 1/2 tab daily      . clopidogrel (PLAVIX) 75 MG tablet Take 1 tablet (75 mg total) by mouth daily.  30 tablet  11  . furosemide (LASIX) 20 MG tablet Take 1 tablet (20 mg total) by mouth daily.  90 tablet  3  . isosorbide mononitrate (IMDUR) 30 MG 24 hr tablet Take 1 tablet (30 mg total) by mouth daily.  30 tablet  10  . losartan (COZAAR) 50 MG tablet Take 50 mg by mouth at bedtime.      . metoprolol tartrate (LOPRESSOR) 25 MG tablet Take 12.5 mg by mouth 2 (two) times daily.       . nitroGLYCERIN (NITROSTAT) 0.4 MG SL tablet Place 1 tablet (0.4 mg total) under the tongue every 5 (five) minutes x 3 doses as needed for chest pain.  30 tablet  4  . pravastatin (PRAVACHOL) 20 MG tablet Take 20 mg by mouth daily.       No current facility-administered medications for this visit.    Past Medical History  Diagnosis Date  . HTN (hypertension)   . HLD (hyperlipidemia)   . Edema   . Benign neoplasm of colon   . Dyspnea on exertion   . Prostate cancer   . Osteopenia   . Emphysema   . Bronchitis   . Obesity   . Malaise and fatigue   . CAD (coronary artery disease) Dec 2011    s/p CABG per Dr. Cornelius Moras; had normal EF; All SVGs occluded per follow up cath with only LIMA to LAD  patent; s/p PCI of the proximal and mid LAD February 2014 per Dr. Excell Seltzer    Past Surgical History  Procedure Laterality Date  . Median sternotomy  08/23/10  . Coronary artery bypass graft  08/23/10  . Cervical disc surgery    . Cardiac catheterization  10/14/12  . Coronary stent placement  10/14/12    with stent to proximal and mid LAD    Family History  Problem Relation Age of Onset  . Heart disease Brother   . Skin cancer Brother   . Prostate cancer Brother   . Kidney cancer Father   . Heart attack Father     History   Social History  . Marital Status: Married    Spouse Name: N/A    Number of Children: N/A  . Years of Education: N/A   Occupational History  . retired Chartered certified accountant    Social History Main Topics  . Smoking status: Former Games developer  . Smokeless tobacco: Not on file     Comment: reported that he chews tobacco 11/10/12  . Alcohol Use: No  . Drug Use:  No  . Sexually Active: Not Currently   Other Topics Concern  . Not on file   Social History Narrative  . No narrative on file    Review of Systems:  All systems reviewed.  They are negative to the above problem except as previously stated.  Vital Signs: BP 124/52  Pulse 50  Physical Exam Patient is in NAD HEENT:  Normocephalic, atrauma Chest:  Small region of erythema L upper parasternal region. Lungs: clear to auscultation. No rales no wheezes.  Heart: Regular rate and rhythm. Normal S1, S2. No S3.   No significant murmurs. PMI not displaced.  Abdomen:  Supple, nontender. Normal bowel sounds. No masses. No hepatomegaly.  Extremities:   Good distal pulses throughout. No lower extremity edema.  Musculoskeletal :moving all extremities.  Neuro:   alert and oriented x3.  CN II-XII grossly intact.   Assessment and Plan:  1.  CAD  Keep on same regimen  Stay active  F/U in fall  2.  HTN  Good control  3  Erythema  Will refer to derm.  I do not think related to cath / radiation.    4. HL  Keep on  statin.

## 2013-02-01 NOTE — Patient Instructions (Addendum)
LABS TODAY:  CBC, BMET  Your physician wants you to follow-up in: November 2014 with Dr. Tenny Craw.  You will receive a reminder letter in the mail two months in advance. If you don't receive a letter, please call our office to schedule the follow-up appointment.

## 2013-02-17 ENCOUNTER — Other Ambulatory Visit: Payer: Self-pay | Admitting: Pharmacist

## 2013-02-17 MED ORDER — NITROGLYCERIN 0.4 MG SL SUBL: 0.4000 mg | SUBLINGUAL_TABLET | SUBLINGUAL | Status: DC | PRN

## 2013-02-24 ENCOUNTER — Telehealth: Payer: Self-pay | Admitting: Internal Medicine

## 2013-02-24 ENCOUNTER — Other Ambulatory Visit (INDEPENDENT_AMBULATORY_CARE_PROVIDER_SITE_OTHER): Payer: Medicare Other

## 2013-02-24 ENCOUNTER — Telehealth: Payer: Self-pay | Admitting: Physician Assistant

## 2013-02-24 DIAGNOSIS — I2510 Atherosclerotic heart disease of native coronary artery without angina pectoris: Secondary | ICD-10-CM

## 2013-02-24 DIAGNOSIS — R0609 Other forms of dyspnea: Secondary | ICD-10-CM

## 2013-02-24 DIAGNOSIS — R609 Edema, unspecified: Secondary | ICD-10-CM | POA: Diagnosis not present

## 2013-02-24 DIAGNOSIS — R0989 Other specified symptoms and signs involving the circulatory and respiratory systems: Secondary | ICD-10-CM

## 2013-02-24 LAB — CBC WITH DIFFERENTIAL/PLATELET
Basophils Absolute: 0 10*3/uL (ref 0.0–0.1)
Eosinophils Absolute: 0.4 10*3/uL (ref 0.0–0.7)
HCT: 39.5 % (ref 39.0–52.0)
Lymphs Abs: 1.2 10*3/uL (ref 0.7–4.0)
MCHC: 33.1 g/dL (ref 30.0–36.0)
MCV: 91.4 fl (ref 78.0–100.0)
Monocytes Absolute: 0.6 10*3/uL (ref 0.1–1.0)
Neutrophils Relative %: 69.1 % (ref 43.0–77.0)
Platelets: 195 10*3/uL (ref 150.0–400.0)
RDW: 14.6 % (ref 11.5–14.6)

## 2013-02-24 LAB — BASIC METABOLIC PANEL
Calcium: 9.1 mg/dL (ref 8.4–10.5)
GFR: 66.72 mL/min (ref >60.00)
Glucose, Bld: 118 mg/dL — ABNORMAL HIGH (ref 70–99)
Potassium: 4.3 mEq/L (ref 3.5–5.1)
Sodium: 139 mEq/L (ref 135–145)

## 2013-02-24 LAB — D-DIMER, QUANTITATIVE: D-Dimer, Quant: 0.45 ug/mL-FEU (ref 0.00–0.48)

## 2013-02-24 LAB — TROPONIN I: Troponin I: 0.01 ng/mL (ref <0.06)

## 2013-02-24 LAB — BRAIN NATRIURETIC PEPTIDE: Pro B Natriuretic peptide (BNP): 275 pg/mL — ABNORMAL HIGH (ref 0.0–100.0)

## 2013-02-24 NOTE — Telephone Encounter (Signed)
Pt aware we will call him with lab results once reviewed by Dr. Waldon Merl RN

## 2013-02-24 NOTE — Telephone Encounter (Signed)
I spoke with Dr. Tenny Craw and she will contact patient. Mylo Red RN

## 2013-02-24 NOTE — Telephone Encounter (Signed)
Received stat labs of negative troponin, negative d-dimer. Called pt and informed him of labs. He has no current complaints. I told him to follow up as previously directed and he said he wasn't sure there was a plan. I told him to call our office tomorrow if he hasn't heard from [place] by noon. He also knows to go to the ER if he begins developing symptoms or feels worse. He verbalized understanding and gratitude. Lavanda Nevels PA-C

## 2013-02-24 NOTE — Telephone Encounter (Signed)
Dr. Tenny Craw reviewed & talked with pt wife Pt will come in for lab work today:  Cbc,bmp,bnp,troponin, & d-dimer Orders placed.

## 2013-02-24 NOTE — Telephone Encounter (Signed)
Pt comes in for lab work  Blood pressure check done   143/81  HR 48      Weight  271.4 pounds  Pt very talkative & cheerful wife present. Ambulates well. Discussed following a low sodium, low fat diet & portion control. Pt asking about how to lose weight. Mylo Red RN

## 2013-02-24 NOTE — Telephone Encounter (Signed)
Wife is calling today b/c pt blood pressure has been fluctuating & she is not sure if his medication needs readjusting  Before medications at 7am:  BP 173/70  HR  55                                      12 pm:      149/66         48                                      Recheck: 154/77         49  States pt is swimmy headed when he bends over but not when he is sitting or walking.  Also short of breath but states this is nothing new. States pt doesn't feel good & has been sleeping in a chair for about 1 month b/c of shortness of breath  She further states pt "doesn't feel good"  She states he does not need to go to the ED.  Wanted to know if medication needed to be readjusted by Dr. Tenny Craw

## 2013-02-24 NOTE — Addendum Note (Signed)
Addended by: Lisabeth Devoid F on: 02/24/2013 01:31 PM   Modules accepted: Orders

## 2013-02-24 NOTE — Telephone Encounter (Signed)
New Problem  Pt's wife said pt is swimmy headed and his BP has been fluctuating.  She said he is also having headaches.  She wants to know if he meds need to be adjusted.

## 2013-02-26 NOTE — Progress Notes (Signed)
LMTCB

## 2013-03-01 ENCOUNTER — Other Ambulatory Visit: Payer: Self-pay

## 2013-03-01 ENCOUNTER — Telehealth: Payer: Self-pay | Admitting: Internal Medicine

## 2013-03-01 MED ORDER — FUROSEMIDE 20 MG PO TABS: ORAL_TABLET | ORAL | Status: DC

## 2013-03-01 NOTE — Telephone Encounter (Signed)
New Problem:    Patient called in returning Cory's call.  Please call back.

## 2013-04-07 ENCOUNTER — Other Ambulatory Visit: Payer: Self-pay | Admitting: Dermatology

## 2013-05-10 ENCOUNTER — Telehealth: Payer: Self-pay | Admitting: Internal Medicine

## 2013-05-10 DIAGNOSIS — I25810 Atherosclerosis of coronary artery bypass graft(s) without angina pectoris: Secondary | ICD-10-CM

## 2013-05-10 DIAGNOSIS — I10 Essential (primary) hypertension: Secondary | ICD-10-CM

## 2013-05-10 NOTE — Telephone Encounter (Signed)
New problem     C/O heart rate is 48 last night , same this am . Sleeping a lot .

## 2013-05-10 NOTE — Telephone Encounter (Signed)
Spoke with patient's wife. Pt complained of being tired and sleeping a lot. She checked his heart rate and BP last night. Heart rate 48, BP 115/54, this morning BP 136/63 heart rate 48. Wife states pt denies any other symptoms except more sleepiness. Heart rate at OV with Dr Tenny Craw in June 2014 was 50, wife confirmed pt is taking metoprolol 12.5 mg two times a day. I will forward to Dr Tenny Craw for review.

## 2013-05-11 ENCOUNTER — Other Ambulatory Visit: Payer: Self-pay

## 2013-05-11 DIAGNOSIS — I10 Essential (primary) hypertension: Secondary | ICD-10-CM

## 2013-05-11 DIAGNOSIS — I25810 Atherosclerosis of coronary artery bypass graft(s) without angina pectoris: Secondary | ICD-10-CM

## 2013-05-11 MED ORDER — PRAVASTATIN SODIUM 20 MG PO TABS: 20.0000 mg | ORAL_TABLET | Freq: Every day | ORAL | Status: DC

## 2013-05-11 MED ORDER — METOPROLOL TARTRATE 25 MG PO TABS: ORAL_TABLET | ORAL | Status: DC

## 2013-05-11 NOTE — Telephone Encounter (Signed)
I advised patient's wife, Byrd Hesselbach of Dr. Charlott Rakes advice to take Metoprolol 12.5 mg QD for 3 days and then stop.  Patient's wife verbalized understanding and will call our office if patient's condition does not improve.

## 2013-05-11 NOTE — Telephone Encounter (Signed)
Take metoprolol 1x per day for 3 days and then stop.

## 2013-05-12 ENCOUNTER — Other Ambulatory Visit: Payer: Self-pay

## 2013-05-12 MED ORDER — PRAVASTATIN SODIUM 20 MG PO TABS: 20.0000 mg | ORAL_TABLET | Freq: Every day | ORAL | Status: DC

## 2013-05-26 DIAGNOSIS — C61 Malignant neoplasm of prostate: Secondary | ICD-10-CM | POA: Diagnosis not present

## 2013-06-10 ENCOUNTER — Encounter: Payer: Self-pay | Admitting: Internal Medicine

## 2013-06-10 ENCOUNTER — Ambulatory Visit (INDEPENDENT_AMBULATORY_CARE_PROVIDER_SITE_OTHER): Payer: Medicare Other | Admitting: Internal Medicine

## 2013-06-10 VITALS — BP 150/76 | HR 67 | Ht 70.0 in | Wt 267.0 lb

## 2013-06-10 DIAGNOSIS — R5383 Other fatigue: Secondary | ICD-10-CM

## 2013-06-10 DIAGNOSIS — I4891 Unspecified atrial fibrillation: Secondary | ICD-10-CM

## 2013-06-10 DIAGNOSIS — R5381 Other malaise: Secondary | ICD-10-CM

## 2013-06-10 DIAGNOSIS — E785 Hyperlipidemia, unspecified: Secondary | ICD-10-CM

## 2013-06-10 DIAGNOSIS — I25810 Atherosclerosis of coronary artery bypass graft(s) without angina pectoris: Secondary | ICD-10-CM

## 2013-06-10 DIAGNOSIS — I10 Essential (primary) hypertension: Secondary | ICD-10-CM

## 2013-06-10 DIAGNOSIS — R0602 Shortness of breath: Secondary | ICD-10-CM

## 2013-06-10 LAB — CBC WITH DIFFERENTIAL/PLATELET
Basophils Absolute: 0 10*3/uL (ref 0.0–0.1)
HCT: 43.5 % (ref 39.0–52.0)
Lymphocytes Relative: 19.3 % (ref 12.0–46.0)
Lymphs Abs: 1.4 10*3/uL (ref 0.7–4.0)
Monocytes Relative: 11.2 % (ref 3.0–12.0)
Neutrophils Relative %: 64.6 % (ref 43.0–77.0)
Platelets: 221 10*3/uL (ref 150.0–400.0)
RDW: 14.3 % (ref 11.5–14.6)

## 2013-06-10 LAB — BASIC METABOLIC PANEL
BUN: 14 mg/dL (ref 6–23)
Calcium: 8.8 mg/dL (ref 8.4–10.5)
Creatinine, Ser: 1 mg/dL (ref 0.4–1.5)
GFR: 75.98 mL/min (ref >60.00)

## 2013-06-10 LAB — BRAIN NATRIURETIC PEPTIDE: Pro B Natriuretic peptide (BNP): 94 pg/mL (ref 0.0–100.0)

## 2013-06-10 LAB — LIPID PANEL
Cholesterol: 136 mg/dL (ref 0–200)
Triglycerides: 325 mg/dL — ABNORMAL HIGH (ref 0.0–149.0)
VLDL: 65 mg/dL — ABNORMAL HIGH (ref 0.0–40.0)

## 2013-06-10 MED ORDER — LOSARTAN POTASSIUM 50 MG PO TABS: 50.0000 mg | ORAL_TABLET | Freq: Every day | ORAL | Status: DC

## 2013-06-10 MED ORDER — ISOSORBIDE MONONITRATE ER 30 MG PO TB24: 30.0000 mg | ORAL_TABLET | Freq: Every day | ORAL | Status: DC

## 2013-06-10 NOTE — Progress Notes (Signed)
HPI 79 year old man presenting for followup evaluation. He has coronary artery disease with prior CABG. All of his vein grafts are occluded and is last remaining graft is a LIMA to diagonal. He underwent PCI/DES to  LAD in Feb  He was seen by Dayle Points in clinic in March.I saw him in June. Since June has has been doing OK  Breathing gets short with activity but not worse  He thinks it is better since before PCI  Denies CP  Wife says he keeps busy.    No Known Allergies  Current Outpatient Prescriptions  Medication Sig Dispense Refill  . ALPRAZolam (XANAX) 0.25 MG tablet Take 1 tablet (0.25 mg total) by mouth 2 (two) times daily as needed for anxiety.  30 tablet  1  . aspirin 325 MG tablet Take 162.5 mg by mouth daily. Take 1/2 tab daily      . clopidogrel (PLAVIX) 75 MG tablet Take 1 tablet (75 mg total) by mouth daily.  30 tablet  11  . furosemide (LASIX) 20 MG tablet Take 40 mg once a week and 20 mg on all other days.  105 tablet  3  . isosorbide mononitrate (IMDUR) 30 MG 24 hr tablet Take 1 tablet (30 mg total) by mouth daily.  30 tablet  10  . losartan (COZAAR) 50 MG tablet Take 50 mg by mouth at bedtime.      . nitroGLYCERIN (NITROSTAT) 0.4 MG SL tablet Place 1 tablet (0.4 mg total) under the tongue every 5 (five) minutes x 3 doses as needed for chest pain.  30 tablet  4  . pravastatin (PRAVACHOL) 20 MG tablet Take 1 tablet (20 mg total) by mouth daily.  30 tablet  6   No current facility-administered medications for this visit.    Past Medical History  Diagnosis Date  . HTN (hypertension)   . HLD (hyperlipidemia)   . Edema   . Benign neoplasm of colon   . Dyspnea on exertion   . Prostate cancer   . Osteopenia   . Emphysema   . Bronchitis   . Obesity   . Malaise and fatigue   . CAD (coronary artery disease) Dec 2011    s/p CABG per Dr. Cornelius Moras; had normal EF; All SVGs occluded per follow up cath with only LIMA to LAD patent; s/p PCI of the proximal and mid LAD February 2014 per Dr.  Excell Seltzer    Past Surgical History  Procedure Laterality Date  . Median sternotomy  08/23/10  . Coronary artery bypass graft  08/23/10  . Cervical disc surgery    . Cardiac catheterization  10/14/12  . Coronary stent placement  10/14/12    with stent to proximal and mid LAD    Family History  Problem Relation Age of Onset  . Heart disease Brother   . Skin cancer Brother   . Prostate cancer Brother   . Kidney cancer Father   . Heart attack Father     History   Social History  . Marital Status: Married    Spouse Name: N/A    Number of Children: N/A  . Years of Education: N/A   Occupational History  . retired Chartered certified accountant    Social History Main Topics  . Smoking status: Former Games developer  . Smokeless tobacco: Not on file     Comment: reported that he chews tobacco 11/10/12  . Alcohol Use: No  . Drug Use: No  . Sexual Activity: Not Currently   Other Topics Concern  .  Not on file   Social History Narrative  . No narrative on file    Review of Systems:  All systems reviewed.  They are negative to the above problem except as previously stated.  Vital Signs: BP 150/76  Pulse 67  Ht 5\' 10"  (1.778 m)  Wt 267 lb (121.11 kg)  BMI 38.31 kg/m2  Physical Exam Patient is in NAD HEENT:  Normocephalic, atrauma Chest:  Small region of erythema L upper parasternal region. Lungs: clear to auscultation. No rales no wheezes.  Heart: Regular rate and rhythm. Normal S1, S2. No S3.   No significant murmurs. PMI not displaced.  Abdomen:  Supple, nontender. Normal bowel sounds. No masses. No hepatomegaly.  Extremities:   Good distal pulses throughout. No lower extremity edema.  Musculoskeletal :moving all extremities.  Neuro:   alert and oriented x3.  CN II-XII grossly intact.   Assessment and Plan:  1.  CAD  Keep on same regimen  Stay active  F/U in spring  Check CBC  2.  HTN Fair control  Will check labs   4. HL  Keep on statin.  Check labs.

## 2013-06-10 NOTE — Patient Instructions (Addendum)
Your physician wants you to follow-up in:end of Feb 2015 You will receive a reminder letter in the mail two months in advance. If you don't receive a letter, please call our office to schedule the follow-up appointment.  Lab work today: bmp, bnp, cbc We will call you with results.

## 2013-06-21 ENCOUNTER — Other Ambulatory Visit: Payer: Self-pay | Admitting: *Deleted

## 2013-06-21 DIAGNOSIS — I25810 Atherosclerosis of coronary artery bypass graft(s) without angina pectoris: Secondary | ICD-10-CM

## 2013-06-21 MED ORDER — ISOSORBIDE MONONITRATE ER 30 MG PO TB24: 30.0000 mg | ORAL_TABLET | Freq: Every day | ORAL | Status: DC

## 2013-08-09 ENCOUNTER — Other Ambulatory Visit: Payer: Self-pay

## 2013-08-09 MED ORDER — NITROGLYCERIN 0.4 MG SL SUBL: 0.4000 mg | SUBLINGUAL_TABLET | SUBLINGUAL | Status: DC | PRN

## 2013-08-09 MED ORDER — FUROSEMIDE 20 MG PO TABS: ORAL_TABLET | ORAL | Status: DC

## 2013-08-10 ENCOUNTER — Telehealth: Payer: Self-pay | Admitting: Internal Medicine

## 2013-08-10 NOTE — Telephone Encounter (Signed)
New Problem    Pt needs a new prescription for [place] please.  Called in to [facility] order.

## 2013-08-17 ENCOUNTER — Ambulatory Visit: Payer: Medicare Other

## 2013-08-17 ENCOUNTER — Ambulatory Visit (INDEPENDENT_AMBULATORY_CARE_PROVIDER_SITE_OTHER): Payer: Medicare Other | Admitting: Family Medicine

## 2013-08-17 VITALS — BP 130/82 | HR 57 | Temp 97.7°F | Resp 17 | Ht 69.0 in | Wt 266.0 lb

## 2013-08-17 DIAGNOSIS — J019 Acute sinusitis, unspecified: Secondary | ICD-10-CM

## 2013-08-17 DIAGNOSIS — R05 Cough: Secondary | ICD-10-CM

## 2013-08-17 DIAGNOSIS — J029 Acute pharyngitis, unspecified: Secondary | ICD-10-CM

## 2013-08-17 DIAGNOSIS — I2510 Atherosclerotic heart disease of native coronary artery without angina pectoris: Secondary | ICD-10-CM

## 2013-08-17 LAB — POCT CBC
Granulocyte percent: 76.1 %G (ref 37–80)
HCT, POC: 49.5 % (ref 43.5–53.7)
Hemoglobin: 15.5 g/dL (ref 14.1–18.1)
Lymph, poc: 1.8 (ref 0.6–3.4)
MCHC: 31.3 g/dL — AB (ref 31.8–35.4)
MID (cbc): 0.6 (ref 0–0.9)
MPV: 9.4 fL (ref 0–99.8)
POC Granulocyte: 7.6 — AB (ref 2–6.9)
POC LYMPH PERCENT: 17.7 %L (ref 10–50)
POC MID %: 6.2 %M (ref 0–12)
Platelet Count, POC: 286 10*3/uL (ref 142–424)
RDW, POC: 13.6 %

## 2013-08-17 MED ORDER — IPRATROPIUM BROMIDE 0.03 % NA SOLN: 2.0000 | Freq: Two times a day (BID) | NASAL | Status: DC

## 2013-08-17 MED ORDER — AMOXICILLIN-POT CLAVULANATE 875-125 MG PO TABS: 1.0000 | ORAL_TABLET | Freq: Two times a day (BID) | ORAL | Status: DC

## 2013-08-17 NOTE — Progress Notes (Signed)
Subjective:    Patient ID: Levi [name], male    DOB: [birth year], 79 y.o.   MRN: [id number]  HPI This 79 y.o. male presents for evaluation of five day history of sore throat, malaise, coughing.  Onset of chest soreness B; worried about pneumonia.  Still has ST, sinus drainage.  No fever but +chills; no sweats.  +fatigue.  Mild headache.  No ear pain.  +ST diffuse.  +rhinorrhea constant; +PND; mild nasal congestion bloody yellow.  +coughing; +SOB chronic for past six years.  No apparent worsening.  No vomiting or diarrhea.  History of pneumonia years ago.  S/p flu vaccine.  Decongestant.  Review of Systems  Constitutional: Positive for chills and fatigue. Negative for fever and diaphoresis.  HENT: Positive for congestion, rhinorrhea, sinus pressure and sore throat. Negative for ear pain and postnasal drip.   Respiratory: Positive for cough and shortness of breath. Negative for wheezing and stridor.   Cardiovascular: Negative for chest pain.  Gastrointestinal: Negative for nausea, vomiting and diarrhea.  Neurological: Negative for dizziness, light-headedness and headaches.   Past Medical History  Diagnosis Date  . HTN (hypertension)   . HLD (hyperlipidemia)   . Edema   . Benign neoplasm of colon   . Dyspnea on exertion   . Prostate cancer   . Osteopenia   . Emphysema   . Bronchitis   . Obesity   . Malaise and fatigue   . CAD (coronary artery disease) Dec 2011    s/p CABG per Dr. Cornelius Moras; had normal EF; All SVGs occluded per follow up cath with only LIMA to LAD patent; s/p PCI of the proximal and mid LAD February 2014 per Dr. Excell Seltzer  No Known Allergies History   Social History  . Marital Status: Married    Spouse Name: N/A    Number of Children: N/A  . Years of Education: N/A   Occupational History  . retired Chartered certified accountant    Social History Main Topics  . Smoking status: Former Games developer  . Smokeless tobacco: Not on file     Comment: reported that he chews tobacco 11/10/12  .  Alcohol Use: No  . Drug Use: No  . Sexual Activity: Not Currently   Other Topics Concern  . Not on file   Social History Narrative  . No narrative on file        Objective:   Physical Exam  Nursing note and vitals reviewed. Constitutional: He is oriented to person, place, and time. He appears well-developed and well-nourished. No distress.  HENT:  Head: Normocephalic and atraumatic.  Right Ear: External ear normal.  Left Ear: External ear normal.  Nose: Nose normal.  Mouth/Throat: Oropharynx is clear and moist.  Eyes: Conjunctivae and EOM are normal. Pupils are equal, round, and reactive to light.  Neck: Normal range of motion. Neck supple. Carotid bruit is not present. No thyromegaly present.  Cardiovascular: Normal rate, regular rhythm, normal heart sounds and intact distal pulses.  Exam reveals no gallop and no friction rub.   No murmur heard. Pulmonary/Chest: Effort normal and breath sounds normal. He has no wheezes. He has no rales.  Abdominal: Soft. Bowel sounds are normal. He exhibits no distension and no mass. There is no tenderness. There is no rebound and no guarding.  Lymphadenopathy:    He has no cervical adenopathy.  Neurological: He is alert and oriented to person, place, and time. No cranial nerve deficit.  Skin: Skin is warm and dry. No rash noted. He  is not diaphoretic.  Psychiatric: He has a normal mood and affect. His behavior is normal.    [facility] reading (PRIMARY) by  Dr. Katrinka Blazing. CXR: sternotomy wires present; cardiac stenting present; NAD.  Results for orders placed in visit on 08/17/13  POCT CBC      Result Value Range   WBC 10.0  4.6 - 10.2 K/uL   Lymph, poc 1.8  0.6 - 3.4   POC LYMPH PERCENT 17.7  10 - 50 %L   MID (cbc) 0.6  0 - 0.9   POC MID % 6.2  0 - 12 %M   POC Granulocyte 7.6 (*) 2 - 6.9   Granulocyte percent 76.1  37 - 80 %G   RBC 5.14  4.69 - 6.13 M/uL   Hemoglobin 15.5  14.1 - 18.1 g/dL   HCT, POC 08.6  57.8 - 53.7 %   MCV 96.4  80 - 97  fL   MCH, POC 30.2  27 - 31.2 pg   MCHC 31.3 (*) 31.8 - 35.4 g/dL   RDW, POC 46.9     Platelet Count, POC 286  142 - 424 K/uL   MPV 9.4  0 - 99.8 fL       Assessment & Plan:  Cough - Plan: POCT CBC, DG Chest 2 View  Acute pharyngitis  CAD (coronary artery disease)  Acute sinusitis  1. Acute pharyngitis/URI:  New.  Rx for Atrovent nasal spray provided; recommend Mucinex DM bid for cough. 2.  Acute sinusitis:  New.  Rx for Augmentin and Atrovent nasal spray provided. 3. CAD: stable; asymptomatic at this time.  Meds ordered this encounter  Medications  . DISCONTD: amoxicillin-clavulanate (AUGMENTIN) 875-125 MG per tablet    Sig: Take 1 tablet by mouth 2 (two) times daily.    Dispense:  20 tablet    Refill:  0  . ipratropium (ATROVENT) 0.03 % nasal spray    Sig: Place 2 sprays into the nose 2 (two) times daily.    Dispense:  30 mL    Refill:  0   Nilda Simmer, M.D.  [facility] [address] [place], [place]  [address] [phone] phone [phone] fax

## 2013-08-17 NOTE — Patient Instructions (Signed)

## 2013-10-29 DIAGNOSIS — Z87891 Personal history of nicotine dependence: Secondary | ICD-10-CM | POA: Insufficient documentation

## 2013-10-29 DIAGNOSIS — Z6838 Body mass index (BMI) 38.0-38.9, adult: Secondary | ICD-10-CM | POA: Diagnosis not present

## 2013-10-29 DIAGNOSIS — IMO0002 Reserved for concepts with insufficient information to code with codable children: Secondary | ICD-10-CM | POA: Diagnosis not present

## 2013-11-03 ENCOUNTER — Other Ambulatory Visit: Payer: Self-pay

## 2013-11-03 MED ORDER — CLOPIDOGREL BISULFATE 75 MG PO TABS: 75.0000 mg | ORAL_TABLET | Freq: Every day | ORAL | Status: DC

## 2013-11-04 ENCOUNTER — Telehealth: Payer: Self-pay | Admitting: *Deleted

## 2013-11-04 NOTE — Telephone Encounter (Signed)
Spoke with pt, pt had dropped off article for dr Harrington Challenger to read regarding recent surgical tech tackles valve problems. Pt aware dr Harrington Challenger has reviewed and it does not apply to him, he does not have a valve issue. Patient voiced understanding

## 2013-11-15 ENCOUNTER — Encounter: Payer: Self-pay | Admitting: Internal Medicine

## 2013-11-15 ENCOUNTER — Ambulatory Visit (INDEPENDENT_AMBULATORY_CARE_PROVIDER_SITE_OTHER): Payer: Medicare Other | Admitting: Internal Medicine

## 2013-11-15 VITALS — BP 174/75 | HR 51 | Ht 69.0 in | Wt 246.0 lb

## 2013-11-15 DIAGNOSIS — I209 Angina pectoris, unspecified: Secondary | ICD-10-CM

## 2013-11-15 DIAGNOSIS — R079 Chest pain, unspecified: Secondary | ICD-10-CM | POA: Diagnosis not present

## 2013-11-15 DIAGNOSIS — I4891 Unspecified atrial fibrillation: Secondary | ICD-10-CM | POA: Diagnosis not present

## 2013-11-15 DIAGNOSIS — I2510 Atherosclerotic heart disease of native coronary artery without angina pectoris: Secondary | ICD-10-CM

## 2013-11-15 LAB — BASIC METABOLIC PANEL
BUN: 14 mg/dL (ref 6–23)
CALCIUM: 8.5 mg/dL (ref 8.4–10.5)
CHLORIDE: 103 meq/L (ref 96–112)
CO2: 30 mEq/L (ref 19–32)
CREATININE: 1 mg/dL (ref 0.4–1.5)
GFR: 72.54 mL/min (ref >60.00)
Glucose, Bld: 90 mg/dL (ref 70–99)
Potassium: 3.5 mEq/L (ref 3.5–5.1)
Sodium: 139 mEq/L (ref 135–145)

## 2013-11-15 LAB — CBC
HEMATOCRIT: 41.7 % (ref 39.0–52.0)
Hemoglobin: 13.9 g/dL (ref 13.0–17.0)
MCHC: 33.5 g/dL (ref 30.0–36.0)
MCV: 90.6 fl (ref 78.0–100.0)
Platelets: 228 10*3/uL (ref 150.0–400.0)
RBC: 4.6 Mil/uL (ref 4.22–5.81)
RDW: 13.2 % (ref 11.5–14.6)
WBC: 8.6 10*3/uL (ref 4.5–10.5)

## 2013-11-15 LAB — TSH: TSH: 4.63 u[IU]/mL (ref 0.35–5.50)

## 2013-11-15 MED ORDER — AMLODIPINE BESYLATE 5 MG PO TABS
2.5000 mg | ORAL_TABLET | Freq: Every day | ORAL | Status: DC
Start: 2013-11-15 — End: 2013-12-14

## 2013-11-15 MED ORDER — AMLODIPINE BESYLATE 5 MG PO TABS: 2.5000 mg | ORAL_TABLET | Freq: Every day | ORAL | Status: DC

## 2013-11-15 NOTE — Patient Instructions (Signed)
Your physician has recommended you make the following change in your medication:   BEGIN AMLODIPINE 5MG  TABS---TAKE 2.5MG  DAILY  Your physician recommends that you return for lab work in: TODAY (BMP, CBC, TSH) . Dr. Harrington Challenger will be in touch with you after reviewing the results of blood work.

## 2013-11-15 NOTE — Progress Notes (Signed)
HPI 80 year old man presenting for followup evaluation. He has coronary artery disease with prior CABG. All of his vein grafts are occluded and is last remaining graft is a LIMA to diagonal. He underwent PCI/DES to  LAD in Feb 2014  He was seen by Ezzie Dural in clinic in March.I saw him last fall  2 wks ago had diarrhea.  Son also.   Weak   Last week couldn't walk a lot  SOB  No hurting in chest  No PND On talking to him he says that on thinking back to Feb 2014, he may have improved a little from intervetion but not long       No Known Allergies  Current Outpatient Prescriptions  Medication Sig Dispense Refill  . ALPRAZolam (XANAX) 0.25 MG tablet Take 1 tablet (0.25 mg total) by mouth 2 (two) times daily as needed for anxiety.  30 tablet  1  . aspirin 325 MG tablet Take 1/2 tab daily      . clopidogrel (PLAVIX) 75 MG tablet Take 1 tablet (75 mg total) by mouth daily.  90 tablet  1  . furosemide (LASIX) 20 MG tablet Take 40 mg once a week and 20 mg on all other days.  105 tablet  3  . ipratropium (ATROVENT) 0.03 % nasal spray Place 2 sprays into the nose 2 (two) times daily.  30 mL  0  . isosorbide mononitrate (IMDUR) 30 MG 24 hr tablet Take 1 tablet (30 mg total) by mouth daily.  30 tablet  0  . losartan (COZAAR) 50 MG tablet Take 1 tablet (50 mg total) by mouth at bedtime.  90 tablet  3  . nitroGLYCERIN (NITROSTAT) 0.4 MG SL tablet Place 1 tablet (0.4 mg total) under the tongue every 5 (five) minutes x 3 doses as needed for chest pain.  30 tablet  4  . pravastatin (PRAVACHOL) 20 MG tablet Take 1 tablet (20 mg total) by mouth daily.  30 tablet  6   No current facility-administered medications for this visit.    Past Medical History  Diagnosis Date  . HTN (hypertension)   . HLD (hyperlipidemia)   . Edema   . Benign neoplasm of colon   . Dyspnea on exertion   . Prostate cancer   . Osteopenia   . Emphysema   . Bronchitis   . Obesity   . Malaise and fatigue   . CAD (coronary artery  disease) Dec 2011    s/p CABG per Dr. Roxy Manns; had normal EF; All SVGs occluded per follow up cath with only LIMA to LAD patent; s/p PCI of the proximal and mid LAD February 2014 per Dr. Burt Knack    Past Surgical History  Procedure Laterality Date  . Median sternotomy  08/23/10  . Coronary artery bypass graft  08/23/10  . Cervical disc surgery    . Cardiac catheterization  10/14/12  . Coronary stent placement  10/14/12    with stent to proximal and mid LAD    Family History  Problem Relation Age of Onset  . Heart disease Brother   . Skin cancer Brother   . Prostate cancer Brother   . Kidney cancer Father   . Heart attack Father     History   Social History  . Marital Status: Married    Spouse Name: N/A    Number of Children: N/A  . Years of Education: N/A   Occupational History  . retired Furniture conservator/restorer    Social History Main Topics  .  Smoking status: Former Research scientist (life sciences)  . Smokeless tobacco: Not on file     Comment: reported that he chews tobacco 11/10/12  . Alcohol Use: No  . Drug Use: No  . Sexual Activity: Not Currently   Other Topics Concern  . Not on file   Social History Narrative  . No narrative on file    Review of Systems:  All systems reviewed.  They are negative to the above problem except as previously stated.  Vital Signs: BP 174/75  Pulse 51  Ht 5\' 9"  (1.753 m)  Wt 246 lb (111.585 kg)  BMI 36.31 kg/m2  Physical Exam Patient is in NAD HEENT:  Normocephalic, atrauma Chest:  Small region of erythema L upper parasternal region. Lungs: clear to auscultation. No rales no wheezes.  Heart: Regular rate and rhythm. Normal S1, S2. No S3.   No significant murmurs. PMI not displaced.  Abdomen:  Supple, nontender. Normal bowel sounds. No masses. No hepatomegaly.  Extremities:   Good distal pulses throughout. No lower extremity edema.  Musculoskeletal :moving all extremities.  Neuro:   alert and oriented x3.  CN II-XII grossly intact.  EKG  SB 51  First degre AV  block.  PR 238 msec.  RBBB.  Lateral MI  LVH Assessment and Plan:  1.  CAD  Concerning symptoms  I would recomm TSH, CBC and BMET.  I will review cath with Ezzie Dural.  ? Further testing     2.  HTN Not good control  May be conttributing to symtpoms  164/70 on my check  I would add a 2.5 mg  May need to increase   4. HL  Keep on statin.  LDL in Oct was 70, Trig 324, HDL 28  Will call

## 2013-11-22 ENCOUNTER — Telehealth: Payer: Self-pay | Admitting: Internal Medicine

## 2013-11-22 DIAGNOSIS — I2510 Atherosclerotic heart disease of native coronary artery without angina pectoris: Secondary | ICD-10-CM

## 2013-11-22 NOTE — Telephone Encounter (Signed)
Spoke with pt, aware of lab results. Pt reports dr Harrington Challenger was going to talk with dr cooper about his stents and if anything could be done to get him more air.  Will discuss with dr Harrington Challenger and call the pt back.

## 2013-11-22 NOTE — Telephone Encounter (Signed)
New message     Wife calling back for test results.

## 2013-11-29 ENCOUNTER — Other Ambulatory Visit: Payer: Self-pay | Admitting: *Deleted

## 2013-11-29 ENCOUNTER — Other Ambulatory Visit: Payer: Self-pay

## 2013-11-29 ENCOUNTER — Telehealth: Payer: Self-pay | Admitting: *Deleted

## 2013-11-29 DIAGNOSIS — I25810 Atherosclerosis of coronary artery bypass graft(s) without angina pectoris: Secondary | ICD-10-CM

## 2013-11-29 MED ORDER — ISOSORBIDE MONONITRATE ER 30 MG PO TB24: 30.0000 mg | ORAL_TABLET | Freq: Every day | ORAL | Status: DC

## 2013-11-29 MED ORDER — FUROSEMIDE 20 MG PO TABS: ORAL_TABLET | ORAL | Status: DC

## 2013-11-29 MED ORDER — PRAVASTATIN SODIUM 20 MG PO TABS: 20.0000 mg | ORAL_TABLET | Freq: Every day | ORAL | Status: DC

## 2013-11-29 NOTE — Telephone Encounter (Signed)
Wife called for 90 day refills

## 2013-12-01 NOTE — Telephone Encounter (Addendum)
Message from dr Harrington Challenger     Reviewed cath with Ezzie Dural.    He would recomm Lexiscan myoview to see if there is anteiror ischemia    Wll need to follow up with BP as outpatient    Left message for pt to call

## 2013-12-02 NOTE — Telephone Encounter (Signed)
Follow up    Returning Debra's call

## 2013-12-02 NOTE — Telephone Encounter (Signed)
Spoke with pt, aware of dr Harrington Challenger recommendations. Order placed and sent to [facility] for scheduling. Patient voiced understanding of instructions

## 2013-12-07 ENCOUNTER — Ambulatory Visit (HOSPITAL_COMMUNITY): Payer: Medicare Other | Attending: Cardiovascular Disease | Admitting: Radiology

## 2013-12-07 VITALS — BP 120/78 | Ht 70.0 in | Wt 258.0 lb

## 2013-12-07 DIAGNOSIS — Z87891 Personal history of nicotine dependence: Secondary | ICD-10-CM | POA: Insufficient documentation

## 2013-12-07 DIAGNOSIS — R0602 Shortness of breath: Secondary | ICD-10-CM | POA: Insufficient documentation

## 2013-12-07 DIAGNOSIS — I2510 Atherosclerotic heart disease of native coronary artery without angina pectoris: Secondary | ICD-10-CM

## 2013-12-07 DIAGNOSIS — R002 Palpitations: Secondary | ICD-10-CM | POA: Insufficient documentation

## 2013-12-07 DIAGNOSIS — I25810 Atherosclerosis of coronary artery bypass graft(s) without angina pectoris: Secondary | ICD-10-CM | POA: Insufficient documentation

## 2013-12-07 DIAGNOSIS — I4510 Unspecified right bundle-branch block: Secondary | ICD-10-CM | POA: Diagnosis not present

## 2013-12-07 DIAGNOSIS — R079 Chest pain, unspecified: Secondary | ICD-10-CM | POA: Diagnosis not present

## 2013-12-07 DIAGNOSIS — R0989 Other specified symptoms and signs involving the circulatory and respiratory systems: Secondary | ICD-10-CM | POA: Insufficient documentation

## 2013-12-07 DIAGNOSIS — J45909 Unspecified asthma, uncomplicated: Secondary | ICD-10-CM | POA: Insufficient documentation

## 2013-12-07 DIAGNOSIS — Z8249 Family history of ischemic heart disease and other diseases of the circulatory system: Secondary | ICD-10-CM | POA: Insufficient documentation

## 2013-12-07 DIAGNOSIS — R0609 Other forms of dyspnea: Secondary | ICD-10-CM | POA: Diagnosis not present

## 2013-12-07 DIAGNOSIS — I4891 Unspecified atrial fibrillation: Secondary | ICD-10-CM

## 2013-12-07 MED ORDER — TECHNETIUM TC 99M SESTAMIBI GENERIC - CARDIOLITE
30.0000 | Freq: Once | INTRAVENOUS | Status: AC | PRN
  Administered 2013-12-07: 30 via INTRAVENOUS

## 2013-12-07 MED ORDER — TECHNETIUM TC 99M SESTAMIBI GENERIC - CARDIOLITE
10.0000 | Freq: Once | INTRAVENOUS | Status: AC | PRN
  Administered 2013-12-07: 10 via INTRAVENOUS

## 2013-12-07 MED ORDER — REGADENOSON 0.4 MG/5ML IV SOLN
0.4000 mg | Freq: Once | INTRAVENOUS | Status: AC
  Administered 2013-12-07: 0.4 mg via INTRAVENOUS

## 2013-12-07 NOTE — Progress Notes (Signed)
[facility] [place], [place] [address] [phone]    Cardiology Nuclear Med Study  Levi [name] is a 80 y.o. male     MRN : [id number]     DOB: [birth year]  Procedure Date: 12/07/2013  Nuclear Med Background Indication for Stress Test:  Evaluation for Ischemia, Graft Patency and Stent Patency History:  '11 ECHO EF: 50-55% MPI: '13 Ischemia EF: 58% mild-mod intensity apical inferior perfusion defect, Asthma, CAD, Cath-CABG All grafts occluded LAD  Cardiac Risk Factors: Family History - CAD, History of Smoking, Hypertension, Lipids and RBBB  Symptoms:  Chest Pain, DOE, Palpitations and SOB   Nuclear Pre-Procedure Caffeine/Decaff Intake:  None > 12 hrs NPO After: 8:00pm   Lungs:  clear O2 Sat: 97% on room air. IV 0.9% NS with Angio Cath:  22g  IV Site: R Antecubital, tolerated well IV Started by:  Irven Baltimore, RN  Chest Size (in):  46 Cup Size: n/a  Height: 5\' 10"  (1.778 m)  Weight:  258 lb (117.028 kg)  BMI:  Body mass index is 37.02 kg/(m^2). Tech Comments:  Took Norvasc and Losartan this am.    Nuclear Med Study 1 or 2 day study: 1 day  Stress Test Type:  Lexiscan  Reading MD: N/A  Order Authorizing Provider:  Minus Breeding, MD  Resting Radionuclide: Technetium 5m Sestamibi  Resting Radionuclide Dose: 11.0 mCi   Stress Radionuclide:  Technetium 19m Sestamibi  Stress Radionuclide Dose: 33.0 mCi           Stress Protocol Rest HR: 50 Stress HR: 68  Rest BP: 120/78 Stress BP: 151/69  Exercise Time (min): n/a METS: n/a   Predicted Max HR: 138 bpm % Max HR: 49.28 bpm Rate Pressure Product: 10268   Dose of Adenosine (mg):  n/a Dose of Lexiscan: 0.4 mg  Dose of Atropine (mg): n/a Dose of Dobutamine: n/a mcg/kg/min (at max HR)  Stress Test Technologist: Perrin Maltese, EMT-P  Nuclear Technologist:  Charlton Amor, CNMT     Rest Procedure:  Myocardial perfusion imaging was performed at rest 45 minutes following the  intravenous administration of Technetium 49m Sestamibi. Rest ECG: NSR-RBBB, LVH  Stress Procedure:  The patient received IV Lexiscan 0.4 mg over 15-seconds.  Technetium 22m Sestamibi injected at 30-seconds. This patient was sob with the Lexiscan injection. Quantitative spect images were obtained after a 45 minute delay. Stress ECG: No significant change from baseline ECG , PVCs  QPS Raw Data Images:  Normal; no motion artifact; normal heart/lung ratio. Stress Images:  There is decreased uptake in the apical anterior, inferior walls and in the true apex.  Rest Images:  There is decreased uptake in the apical anterior, inferior walls and in the true apex.  Subtraction (SDS):  No evidence of ischemia. Transient Ischemic Dilatation (Normal <1.22):  1.01 Lung/Heart Ratio (Normal <0.45):  0.34  Quantitative Gated Spect Images QGS EDV:  117 ml QGS ESV:  51 ml  Impression Exercise Capacity:  Lexiscan with no exercise. BP Response:  Normal blood pressure response. Clinical Symptoms:  There is dyspnea. ECG Impression:  No significant ST segment change suggestive of ischemia. Comparison with Prior Nuclear Study: No images to compare  Overall Impression:  Low risk stress nuclear study with a fixed defect in the apical anterior, inferior walls and in the true apex consistent with distal LAD scar. .No ischemia.   LV Ejection Fraction: 56%.  LV Wall Motion:  NL LV Function; NL Wall Motion  Dorothy Spark 12/07/2013

## 2013-12-13 ENCOUNTER — Telehealth: Payer: Self-pay | Admitting: Internal Medicine

## 2013-12-13 DIAGNOSIS — I10 Essential (primary) hypertension: Secondary | ICD-10-CM

## 2013-12-13 NOTE — Telephone Encounter (Signed)
New message ° ° ° ° °Want stress test results °

## 2013-12-14 MED ORDER — AMLODIPINE BESYLATE 2.5 MG PO TABS: 2.5000 mg | ORAL_TABLET | Freq: Two times a day (BID) | ORAL | Status: DC

## 2013-12-14 NOTE — Telephone Encounter (Signed)
Spoke with pt, aware of test results Patient voiced understanding of med change He will track his bp Follow up scheduled

## 2014-01-09 NOTE — Progress Notes (Signed)
HPI 80 year old man presenting for followup evaluation. He has coronary artery disease with prior CABG. All of his vein grafts are occluded and is last remaining graft is a LIMA to diagonal. He underwent PCI/DES to  LAD in Feb 2014  He was seen by Ezzie Dural in clinic in March.I saw him at the end of march. He had a lexiscna myoview to eval for ischemia.  THis showed small region of dis ttal anterior, distal inferior and apical scar  LVEF normal.  No ischemia.  REcomm medical Rx.    SInce I saw him he denies CP  He stays active but he does get SOB    He stopped amlodipine because he was getting dizzy All the spells he has had has been with bending to standing   BP at home is up and down  (98/ - 170/)    The last week BP has been 98/ to 130s/    No Known Allergies  Current Outpatient Prescriptions  Medication Sig Dispense Refill  . ALPRAZolam (XANAX) 0.25 MG tablet Take 1 tablet (0.25 mg total) by mouth 2 (two) times daily as needed for anxiety.  30 tablet  1  . aspirin 325 MG tablet Take 1/2 tab daily      . clopidogrel (PLAVIX) 75 MG tablet Take 1 tablet (75 mg total) by mouth daily.  90 tablet  1  . furosemide (LASIX) 20 MG tablet Take 40 mg once a week and 20 mg on all other days.  105 tablet  1  . ipratropium (ATROVENT) 0.03 % nasal spray Place 2 sprays into the nose 2 (two) times daily.  30 mL  0  . losartan (COZAAR) 50 MG tablet Take 1 tablet (50 mg total) by mouth at bedtime.  90 tablet  3  . nitroGLYCERIN (NITROSTAT) 0.4 MG SL tablet Place 1 tablet (0.4 mg total) under the tongue every 5 (five) minutes x 3 doses as needed for chest pain.  30 tablet  4  . pravastatin (PRAVACHOL) 20 MG tablet Take 1 tablet (20 mg total) by mouth daily.  90 tablet  3   No current facility-administered medications for this visit.    Past Medical History  Diagnosis Date  . HTN (hypertension)   . HLD (hyperlipidemia)   . Edema   . Benign neoplasm of colon   . Dyspnea on exertion   . Prostate cancer    . Osteopenia   . Emphysema   . Bronchitis   . Obesity   . Malaise and fatigue   . CAD (coronary artery disease) Dec 2011    s/p CABG per Dr. Roxy Manns; had normal EF; All SVGs occluded per follow up cath with only LIMA to LAD patent; s/p PCI of the proximal and mid LAD February 2014 per Dr. Burt Knack    Past Surgical History  Procedure Laterality Date  . Median sternotomy  08/23/10  . Coronary artery bypass graft  08/23/10  . Cervical disc surgery    . Cardiac catheterization  10/14/12  . Coronary stent placement  10/14/12    with stent to proximal and mid LAD    Family History  Problem Relation Age of Onset  . Heart disease Brother   . Skin cancer Brother   . Prostate cancer Brother   . Kidney cancer Father   . Heart attack Father     History   Social History  . Marital Status: Married    Spouse Name: N/A    Number of Children:  N/A  . Years of Education: N/A   Occupational History  . retired Furniture conservator/restorer    Social History Main Topics  . Smoking status: Former Research scientist (life sciences)  . Smokeless tobacco: Not on file     Comment: reported that he chews tobacco 11/10/12  . Alcohol Use: No  . Drug Use: No  . Sexual Activity: Not Currently   Other Topics Concern  . Not on file   Social History Narrative  . No narrative on file    Review of Systems:  All systems reviewed.  They are negative to the above problem except as previously stated.  Vital Signs: BP 120/72  Pulse 60  Ht 5\' 9"  (1.753 m)  Wt 264 lb (119.75 kg)  BMI 38.97 kg/m2  Physical Exam Patient is in NAD HEENT:  Normocephalic, atrauma Chest:  Small region of erythema L upper parasternal region. Lungs: clear to auscultation. No rales no wheezes.  Heart: Regular rate and rhythm. Normal S1, S2. No S3.   No significant murmurs. PMI not displaced.  Abdomen:  Supple, nontender. Normal bowel sounds. No masses. No hepatomegaly.  Extremities:   Good distal pulses throughout. No lower extremity edema.  Musculoskeletal :moving  all extremities.  Neuro:   alert and oriented x3.  CN II-XII grossly intact.  Assessment and Plan:  1.  CAD  I would keep on same meds  No clear cut ischemia on myoview  He does have occluded grafts so SOB is not unexpected.   I recommm he try to lose wt  He admits to eating a lot of carbs.    2.  HTN   Keep on same meds 4. HL  Continue statin Will f/u in September

## 2014-01-14 ENCOUNTER — Ambulatory Visit (INDEPENDENT_AMBULATORY_CARE_PROVIDER_SITE_OTHER): Payer: Medicare Other | Admitting: Internal Medicine

## 2014-01-14 ENCOUNTER — Encounter: Payer: Self-pay | Admitting: Internal Medicine

## 2014-01-14 DIAGNOSIS — I10 Essential (primary) hypertension: Secondary | ICD-10-CM

## 2014-01-14 DIAGNOSIS — E785 Hyperlipidemia, unspecified: Secondary | ICD-10-CM | POA: Diagnosis not present

## 2014-01-14 DIAGNOSIS — I25810 Atherosclerosis of coronary artery bypass graft(s) without angina pectoris: Secondary | ICD-10-CM

## 2014-01-14 DIAGNOSIS — I2510 Atherosclerotic heart disease of native coronary artery without angina pectoris: Secondary | ICD-10-CM

## 2014-01-14 NOTE — Patient Instructions (Signed)
Your physician recommends that you schedule a follow-up appointment in: [place]

## 2014-02-11 DIAGNOSIS — M545 Low back pain, unspecified: Secondary | ICD-10-CM | POA: Diagnosis not present

## 2014-02-18 ENCOUNTER — Telehealth: Payer: Self-pay | Admitting: Internal Medicine

## 2014-02-18 DIAGNOSIS — I10 Essential (primary) hypertension: Secondary | ICD-10-CM

## 2014-02-18 MED ORDER — LOSARTAN POTASSIUM 50 MG PO TABS
50.0000 mg | ORAL_TABLET | Freq: Two times a day (BID) | ORAL | Status: DC
Start: 2014-02-18 — End: 2014-06-30

## 2014-02-18 NOTE — Telephone Encounter (Signed)
Pt calls today b/c last pm ( after having taking Losartan) he was taking a shower & began to feel bad. Stated he broke out in a sweat  & " I felt like I was going to pass out" " I was sweating like a horse"  States his blood pressure at that time was 207/89.  His wife was with him at the time. Stated the sweating broke he cooled off & felt better.  BP this am:  172/69   & 161/66 No nausea or dizziness at this time He has been out & checked on his garden & eaten a bowl of cereal.  Pt is concerned that his blood pressure is bouncing all around & the "spell" he had last night shook him up Will forward to Dr. Harrington Challenger for review.

## 2014-02-18 NOTE — Telephone Encounter (Signed)
New message    Patient calling have trouble with blood pressure last night 207/ 89 . Today  161/66 . No chest pain, no sob .

## 2014-02-18 NOTE — Telephone Encounter (Signed)
I would recomm that he increase losartan to 50 bid Cont lasix 1x per day F/U with BP check in 2 wks. Patient needs to keep diary.

## 2014-02-18 NOTE — Telephone Encounter (Signed)
Spoke with pt, Aware of dr Harrington Challenger recommendation  Follow up scheduled for nurse room blood pressure check in 2 weeks.

## 2014-02-21 DIAGNOSIS — R109 Unspecified abdominal pain: Secondary | ICD-10-CM | POA: Diagnosis not present

## 2014-02-21 DIAGNOSIS — M47817 Spondylosis without myelopathy or radiculopathy, lumbosacral region: Secondary | ICD-10-CM | POA: Insufficient documentation

## 2014-02-21 DIAGNOSIS — C61 Malignant neoplasm of prostate: Secondary | ICD-10-CM | POA: Diagnosis not present

## 2014-02-28 ENCOUNTER — Ambulatory Visit (INDEPENDENT_AMBULATORY_CARE_PROVIDER_SITE_OTHER): Payer: Medicare Other | Admitting: *Deleted

## 2014-02-28 VITALS — BP 110/60 | HR 60 | Resp 12 | Wt 262.8 lb

## 2014-02-28 DIAGNOSIS — I10 Essential (primary) hypertension: Secondary | ICD-10-CM

## 2014-02-28 NOTE — Progress Notes (Signed)
1.) Reason for visit: elevated blood pressure  2.) Name of MD requesting visit: dr Harrington Challenger  3.) H&P: hypertension and medication change  4.) ROS related to problem: blood pressure readings from home range from 207-101/58-89             Pt will continue the same for now.             Will discuss with dr Harrington Challenger when she is in the office this afternoon and call the pt with instructions.              Pt agreed with this plan.   5.) Assessment and plan per MD:

## 2014-02-28 NOTE — Patient Instructions (Signed)
CONTINUE ON THE CURRENT MEDICATION.  WILL DISCUSS WITH DR ROSS AND CALL YOU BACK.

## 2014-03-01 ENCOUNTER — Emergency Department (HOSPITAL_BASED_OUTPATIENT_CLINIC_OR_DEPARTMENT_OTHER): Payer: Medicare Other

## 2014-03-01 ENCOUNTER — Emergency Department (HOSPITAL_BASED_OUTPATIENT_CLINIC_OR_DEPARTMENT_OTHER)
Admission: EM | Admit: 2014-03-01 | Discharge: 2014-03-01 | Disposition: A | Discharge status: Home / Self Care | Payer: Medicare Other | Attending: Emergency Medicine | Admitting: Emergency Medicine

## 2014-03-01 ENCOUNTER — Encounter (HOSPITAL_BASED_OUTPATIENT_CLINIC_OR_DEPARTMENT_OTHER): Payer: Self-pay | Admitting: Emergency Medicine

## 2014-03-01 ENCOUNTER — Telehealth: Payer: Self-pay | Admitting: *Deleted

## 2014-03-01 DIAGNOSIS — Z85038 Personal history of other malignant neoplasm of large intestine: Secondary | ICD-10-CM | POA: Diagnosis not present

## 2014-03-01 DIAGNOSIS — Z8546 Personal history of malignant neoplasm of prostate: Secondary | ICD-10-CM | POA: Insufficient documentation

## 2014-03-01 DIAGNOSIS — E669 Obesity, unspecified: Secondary | ICD-10-CM | POA: Diagnosis not present

## 2014-03-01 DIAGNOSIS — J438 Other emphysema: Secondary | ICD-10-CM | POA: Insufficient documentation

## 2014-03-01 DIAGNOSIS — Z87891 Personal history of nicotine dependence: Secondary | ICD-10-CM | POA: Insufficient documentation

## 2014-03-01 DIAGNOSIS — I10 Essential (primary) hypertension: Secondary | ICD-10-CM | POA: Insufficient documentation

## 2014-03-01 DIAGNOSIS — Z79899 Other long term (current) drug therapy: Secondary | ICD-10-CM | POA: Insufficient documentation

## 2014-03-01 DIAGNOSIS — R110 Nausea: Secondary | ICD-10-CM | POA: Insufficient documentation

## 2014-03-01 DIAGNOSIS — I2510 Atherosclerotic heart disease of native coronary artery without angina pectoris: Secondary | ICD-10-CM | POA: Diagnosis not present

## 2014-03-01 DIAGNOSIS — E785 Hyperlipidemia, unspecified: Secondary | ICD-10-CM | POA: Insufficient documentation

## 2014-03-01 DIAGNOSIS — Z7982 Long term (current) use of aspirin: Secondary | ICD-10-CM | POA: Diagnosis not present

## 2014-03-01 DIAGNOSIS — Z951 Presence of aortocoronary bypass graft: Secondary | ICD-10-CM | POA: Insufficient documentation

## 2014-03-01 DIAGNOSIS — M545 Low back pain, unspecified: Secondary | ICD-10-CM | POA: Diagnosis not present

## 2014-03-01 DIAGNOSIS — Z8709 Personal history of other diseases of the respiratory system: Secondary | ICD-10-CM | POA: Diagnosis not present

## 2014-03-01 DIAGNOSIS — Z7902 Long term (current) use of antithrombotics/antiplatelets: Secondary | ICD-10-CM | POA: Diagnosis not present

## 2014-03-01 DIAGNOSIS — R935 Abnormal findings on diagnostic imaging of other abdominal regions, including retroperitoneum: Secondary | ICD-10-CM | POA: Diagnosis not present

## 2014-03-01 DIAGNOSIS — M5137 Other intervertebral disc degeneration, lumbosacral region: Secondary | ICD-10-CM | POA: Diagnosis not present

## 2014-03-01 LAB — BASIC METABOLIC PANEL
BUN: 14 mg/dL (ref 6–23)
CALCIUM: 8.8 mg/dL (ref 8.4–10.5)
CO2: 25 mEq/L (ref 19–32)
Chloride: 104 mEq/L (ref 96–112)
Creatinine, Ser: 0.9 mg/dL (ref 0.50–1.35)
GFR calc non Af Amer: 77 mL/min — ABNORMAL LOW (ref >90)
GFR, EST AFRICAN AMERICAN: 89 mL/min — AB (ref >90)
Glucose, Bld: 101 mg/dL — ABNORMAL HIGH (ref 70–99)
POTASSIUM: 4 meq/L (ref 3.7–5.3)
SODIUM: 141 meq/L (ref 137–147)

## 2014-03-01 LAB — CBC WITH DIFFERENTIAL/PLATELET
BASOS PCT: 1 % (ref 0–1)
Basophils Absolute: 0 10*3/uL (ref 0.0–0.1)
EOS ABS: 0.4 10*3/uL (ref 0.0–0.7)
EOS PCT: 6 % — AB (ref 0–5)
HCT: 41.4 % (ref 39.0–52.0)
Hemoglobin: 13.9 g/dL (ref 13.0–17.0)
Lymphocytes Relative: 22 % (ref 12–46)
Lymphs Abs: 1.5 10*3/uL (ref 0.7–4.0)
MCH: 30.5 pg (ref 26.0–34.0)
MCHC: 33.6 g/dL (ref 30.0–36.0)
MCV: 90.8 fL (ref 78.0–100.0)
MONOS PCT: 10 % (ref 3–12)
Monocytes Absolute: 0.7 10*3/uL (ref 0.1–1.0)
Neutro Abs: 4 10*3/uL (ref 1.7–7.7)
Neutrophils Relative %: 61 % (ref 43–77)
PLATELETS: 194 10*3/uL (ref 150–400)
RBC: 4.56 MIL/uL (ref 4.22–5.81)
RDW: 13.3 % (ref 11.5–15.5)
WBC: 6.6 10*3/uL (ref 4.0–10.5)

## 2014-03-01 LAB — URINALYSIS, ROUTINE W REFLEX MICROSCOPIC
BILIRUBIN URINE: NEGATIVE
Glucose, UA: NEGATIVE mg/dL
Hgb urine dipstick: NEGATIVE
KETONES UR: NEGATIVE mg/dL
Nitrite: NEGATIVE
Protein, ur: NEGATIVE mg/dL
Specific Gravity, Urine: 1.016 (ref 1.005–1.030)
UROBILINOGEN UA: 0.2 mg/dL (ref 0.0–1.0)
pH: 6 (ref 5.0–8.0)

## 2014-03-01 LAB — URINE MICROSCOPIC-ADD ON

## 2014-03-01 MED ORDER — ONDANSETRON HCL 4 MG/2ML IJ SOLN
4.0000 mg | Freq: Once | INTRAMUSCULAR | Status: AC
  Administered 2014-03-01: 4 mg via INTRAVENOUS
  Filled 2014-03-01: qty 2

## 2014-03-01 MED ORDER — HYDROMORPHONE HCL PF 1 MG/ML IJ SOLN
0.5000 mg | Freq: Once | INTRAMUSCULAR | Status: AC
  Administered 2014-03-01: 0.5 mg via INTRAVENOUS
  Filled 2014-03-01: qty 1

## 2014-03-01 MED ORDER — TRAMADOL HCL 50 MG PO TABS
50.0000 mg | ORAL_TABLET | Freq: Four times a day (QID) | ORAL | Status: DC | PRN
Start: 2014-03-01 — End: 2014-05-20

## 2014-03-01 MED ORDER — SODIUM CHLORIDE 0.9 % IV SOLN
INTRAVENOUS | Status: DC
  Administered 2014-03-01: 12:00:00 via INTRAVENOUS

## 2014-03-01 MED ORDER — HYDROCODONE-ACETAMINOPHEN 5-325 MG PO TABS: 1.0000 | ORAL_TABLET | Freq: Four times a day (QID) | ORAL | Status: DC | PRN

## 2014-03-01 NOTE — ED Notes (Signed)
Pt given coke to drink, md ok'd

## 2014-03-01 NOTE — Discharge Instructions (Signed)
CT scan and MRI without definite explanation for the back pain. They're worse from abnormalities along the lumbar spine but nothing of real severe. Recommend followup with orthopedics referral provided. Call and make an appointment. Take the tramadol or regular basis. Can supplement with hydrocodone as needed for more severe pain would try one pill at first. Return for any newer worse symptoms.

## 2014-03-01 NOTE — ED Notes (Signed)
Patient transported to MRI 

## 2014-03-01 NOTE — ED Notes (Signed)
Pt declines w/c, amb to room 3 with slow, slightly unsteady gait with cg@. Pt family reports pt c/o back pain x 1 month, worsening over last week. Pt saw his "kidney doctor" this week and was told he does not have a kidney stone per family, had ct scan this week, but they have been unable to get results.

## 2014-03-01 NOTE — ED Provider Notes (Signed)
CSN: [id number]     Arrival date & time 03/01/14  1046 History   First MD Initiated Contact with Patient 03/01/14 1107     Chief Complaint  Patient presents with  . Back Pain     (Consider location/radiation/quality/duration/timing/severity/associated sxs/prior Treatment) Patient is a 80 y.o. male presenting with back pain. The history is provided by the patient, the spouse and a relative.  Back Pain Associated symptoms: no abdominal pain, no chest pain, no dysuria, no fever, no headaches, no numbness and no weakness    patient with persistent left-sided lumbar back pain. Not midline. Ongoing for one to 2 months is slowly getting worse. Was seen by his urologist to rule out any kidney stone they did a scan in the opposite is not available here that was supposedly negative. Patient can use to be uncomfortable. No neuro deficits to the legs. Pain does not radiate into the thigh or the back of the leg. But at times it is 10 out of 10 it does cause nausea. The pain now when it is very sharp it is usually due to some sort of movement. No vomiting but does get nauseated when pain is worse. No urinary symptoms. Patient does have a history of prostate cancer. No known injury.  Past Medical History  Diagnosis Date  . HTN (hypertension)   . HLD (hyperlipidemia)   . Edema   . Benign neoplasm of colon   . Dyspnea on exertion   . Prostate cancer   . Osteopenia   . Emphysema   . Bronchitis   . Obesity   . Malaise and fatigue   . CAD (coronary artery disease) Dec 2011    s/p CABG per Dr. Roxy Manns; had normal EF; All SVGs occluded per follow up cath with only LIMA to LAD patent; s/p PCI of the proximal and mid LAD February 2014 per Dr. Burt Knack   Past Surgical History  Procedure Laterality Date  . Median sternotomy  08/23/10  . Coronary artery bypass graft  08/23/10  . Cervical disc surgery    . Cardiac catheterization  10/14/12  . Coronary stent placement  10/14/12    with stent to proximal and mid  LAD   Family History  Problem Relation Age of Onset  . Heart disease Brother   . Skin cancer Brother   . Prostate cancer Brother   . Kidney cancer Father   . Heart attack Father    History  Substance Use Topics  . Smoking status: Former Research scientist (life sciences)  . Smokeless tobacco: Not on file     Comment: reported that he chews tobacco 11/10/12  . Alcohol Use: No    Review of Systems  Constitutional: Negative for fever.  HENT: Negative for congestion.   Eyes: Negative for visual disturbance.  Respiratory: Negative for shortness of breath.   Cardiovascular: Negative for chest pain.  Gastrointestinal: Positive for nausea. Negative for vomiting and abdominal pain.  Genitourinary: Negative for dysuria and testicular pain.  Musculoskeletal: Positive for back pain. Negative for neck pain.  Skin: Negative for rash.  Neurological: Negative for weakness, numbness and headaches.  Hematological: Does not bruise/bleed easily.  Psychiatric/Behavioral: Negative for confusion.      Allergies  Review of patient's allergies indicates no known allergies.  Home Medications   Prior to Admission medications   Medication Sig Start Date End Date Taking? Authorizing Provider  ALPRAZolam (XANAX) 0.25 MG tablet Take 1 tablet (0.25 mg total) by mouth 2 (two) times daily as needed for anxiety.  09/02/12   Lendon Colonel, NP  aspirin 325 MG tablet Take 1/2 tab daily    Historical Provider, MD  clopidogrel (PLAVIX) 75 MG tablet Take 1 tablet (75 mg total) by mouth daily. 11/03/13   [name], MD  furosemide (LASIX) 20 MG tablet Take 40 mg once a week and 20 mg on all other days. 11/29/13   [name], MD  ipratropium (ATROVENT) 0.03 % nasal spray Place 2 sprays into the nose 2 (two) times daily. 08/17/13   Wardell Honour, MD  losartan (COZAAR) 50 MG tablet Take 1 tablet (50 mg total) by mouth 2 (two) times daily. 02/18/14   [name], MD  nitroGLYCERIN (NITROSTAT) 0.4 MG SL tablet Place 1 tablet (0.4 mg total)  under the tongue every 5 (five) minutes x 3 doses as needed for chest pain. 08/09/13   [name], MD  pravastatin (PRAVACHOL) 20 MG tablet Take 1 tablet (20 mg total) by mouth daily. 11/29/13 11/29/14  [name], MD   BP 177/83  Pulse 51  Temp(Src) 97.8 F (36.6 C) (Oral)  Resp 20  Ht 5\' 9"  (1.753 m)  Wt 267 lb (121.11 kg)  BMI 39.41 kg/m2  SpO2 97% Physical Exam  Nursing note and vitals reviewed. Constitutional: He is oriented to person, place, and time. He appears well-developed and well-nourished. No distress.  HENT:  Head: Normocephalic and atraumatic.  Mouth/Throat: Oropharynx is clear and moist.  Eyes: Conjunctivae and EOM are normal. Pupils are equal, round, and reactive to light.  Neck: Normal range of motion.  Cardiovascular: Normal rate, regular rhythm and normal heart sounds.   Pulmonary/Chest: Effort normal and breath sounds normal. No respiratory distress.  Abdominal: Soft. Bowel sounds are normal. There is no tenderness.  Musculoskeletal: Normal range of motion. He exhibits no tenderness.  Neurological: He is alert and oriented to person, place, and time. No cranial nerve deficit. He exhibits normal muscle tone. Coordination normal.  Skin: Skin is warm. No rash noted.    ED Course  Procedures (including critical care time) Labs Review Labs Reviewed  URINALYSIS, ROUTINE W REFLEX MICROSCOPIC - Abnormal; Notable for the following:    Leukocytes, UA SMALL (*)    All other components within normal limits  CBC WITH DIFFERENTIAL - Abnormal; Notable for the following:    Eosinophils Relative 6 (*)    All other components within normal limits  BASIC METABOLIC PANEL - Abnormal; Notable for the following:    Glucose, Bld 101 (*)    GFR calc non Af Amer 77 (*)    GFR calc Af Amer 89 (*)    All other components within normal limits  URINE MICROSCOPIC-ADD ON - Abnormal; Notable for the following:    Crystals CA OXALATE CRYSTALS (*)    All other components within normal  limits    Imaging Review Ct Abdomen Pelvis Wo Contrast  03/01/2014   CLINICAL DATA:  BACK PAIN  EXAM: CT ABDOMEN AND PELVIS WITHOUT CONTRAST  TECHNIQUE: Multidetector CT imaging of the abdomen and pelvis was performed following the standard protocol without IV contrast.  COMPARISON:  CT abdomen pelvis 02/21/2014  FINDINGS: Linear areas of scarring versus atelectasis in the lung bases.  Noncontrast evaluation of the liver, spleen, adrenals, pancreas is unremarkable.  Noncontrast evaluation the kidneys, stable. Likely cyst left kidney. Specifically, no calculus disease nor obstructive uropathy.  Within the limitations of a noncontrast CT no abdominal or pelvic masses, free fluid, loculated fluid collections nor  adenopathy.  The bowel, gallbladder fossa, and appendix negative. Moderate amount of stool within the colon.  No aggressive appearing osseous lesions.  Multilevel spondylosis.  No abdominal aortic aneurysm. Atherosclerotic calcifications in the aorta and iliac vessels.  No abdominal wall nor inguinal hernia.  IMPRESSION: No urinary tract calculi nor obstructive uropathy.  Within the limitations of noncontrast CT no evidence of abdominal pelvic pathology.   Electronically Signed   By: Margaree Mackintosh M.D.   On: 03/01/2014 12:33   Mr Lumbar Spine Wo Contrast  03/01/2014   CLINICAL DATA:  Left side low back pain. History of injury, fall 1-1/2 weeks ago. Remote history of prostate cancer.  EXAM: MRI LUMBAR SPINE WITHOUT CONTRAST  TECHNIQUE: Multiplanar, multisequence MR imaging of the lumbar spine was performed. No intravenous contrast was administered.  COMPARISON:  CT abdomen and pelvis 03/01/2014 11:52 a.m.  FINDINGS: There is no fracture with scattered Schmorl's nodes noted. The patient has bilateral L4 pars interarticularis defects with associated 0.2 cm anterolisthesis L4 on L5. The conus medullaris is normal in signal and position. Imaged intra-abdominal contents are unremarkable.  The T11-12 and  T12-L1 levels are imaged in the sagittal plane only. Shallow disc bulging is seen at each level without central canal or foraminal narrowing.  L1-2: The disc is partially calcified. There is a minimal bulge without central canal or foraminal stenosis.  L2-3: A shallow disc bulge causes mild central canal or foraminal narrowing without nerve root compression.  L3-4: The patient has shallow disc bulge with some ligamentum flavum thickening. The central canal and left lateral recess are mildly narrowed. Mild to moderate foraminal narrowing is more notable on the left.  L4-5: The disc is uncovered with a shallow bulge. The central canal is widely patent. Mild bilateral foraminal narrowing is present.  L5-S1:  Negative.  IMPRESSION: Negative for evidence of trauma.  No acute abnormality.  Chronic bilateral L4 pars interarticularis defects result in 0.2 cm anterolisthesis. Mild bilateral foraminal narrowing is present at L4-5 but the central canal is widely patent.  Mild central canal and foraminal narrowing bilaterally at L2-3.  Mild to moderate foraminal narrowing L3-4, worse on the left.   Electronically Signed   By: Inge Rise M.D.   On: 03/01/2014 15:11     EKG Interpretation None      MDM   Final diagnoses:  Left-sided low back pain without sciatica    Patient with left-sided abdominal pain and discomfort for approximately 2 months. Recently seen by his urologist was workup and felt not to be related to the kidneys or kidney stone. Patient with past history of prostate cancer. Patient states that the discomfort is getting worse. Does not radiate into the thigh or the posterior part of the leg. No neural deficits. It is made worse by movement. Just not improving.  Here patient had CT scan abdomen and pelvis to reevaluate any renal problems. Because the study, urology was not available. That was negative. Patient underwent MRI which has some of subtle changes in the lumbar area but nothing acute.  Followup with orthopedics would be appropriate. We'll treat with pain medication. Patient given tramadol for daily use and hydrocodone as needed for more severe pain. Patient also has primary care Dr. to followup with.    Fredia Sorrow, MD 03/01/14 1536

## 2014-03-01 NOTE — Progress Notes (Signed)
BP is labile  Keep on same regimen

## 2014-03-01 NOTE — Telephone Encounter (Signed)
Spoke with patient. Informed that per Dr. Harrington Challenger : "keep on same meds. No changes". Pt is c/o back pain which is not new.  He feels it may be kidney stones but has seen urology and had CT scan which did not show stones. Patient's wife states their daughter is coming to take patient to hospital this AM.  She is not sure where daughter is taking the patient.

## 2014-03-03 NOTE — Progress Notes (Signed)
Spoke with pt wife, Aware of dr Harrington Challenger recommendation

## 2014-03-04 ENCOUNTER — Other Ambulatory Visit: Payer: Self-pay | Admitting: Internal Medicine

## 2014-03-07 ENCOUNTER — Telehealth: Payer: Self-pay | Admitting: Internal Medicine

## 2014-03-07 NOTE — Telephone Encounter (Signed)
Called stating his stent card has been ruined by water and would like a replacement card.  Spoke w/scheduler-Crystal- at cath lab and they will mail him another card.  Advised pt.

## 2014-03-07 NOTE — Telephone Encounter (Signed)
New message     Patient calling need an replacement stent card.

## 2014-03-08 NOTE — Telephone Encounter (Signed)
Please adivse

## 2014-03-09 DIAGNOSIS — M545 Low back pain, unspecified: Secondary | ICD-10-CM | POA: Diagnosis not present

## 2014-03-22 ENCOUNTER — Telehealth: Payer: Self-pay | Admitting: Internal Medicine

## 2014-03-22 DIAGNOSIS — M549 Dorsalgia, unspecified: Secondary | ICD-10-CM | POA: Diagnosis not present

## 2014-03-22 NOTE — Telephone Encounter (Signed)
lmtcb

## 2014-03-22 NOTE — Telephone Encounter (Signed)
New message     Pt is requesting a "map" on where the stents are in his heart

## 2014-03-22 NOTE — Telephone Encounter (Signed)
Spoke with pt and informed him that I will mail him some resources on stent placement, angioplasty and stenting.  Told pt this will be mailed to him on 7/22 for the mail has already gone out for the day.  Pt verbalized understanding and pleased with all the assistance.

## 2014-05-06 ENCOUNTER — Other Ambulatory Visit: Payer: Self-pay | Admitting: Internal Medicine

## 2014-05-19 NOTE — Progress Notes (Signed)
HPI 80 year old man presenting for followup evaluation. He has coronary artery disease with prior CABG. All of his vein grafts are occluded and is last remaining graft is a LIMA to diagonal. He underwent PCI/DES to  LAD in Feb 2014  He was seen by Ezzie Dural in clinic in March.I saw him at the end of march. He had a lexiscna myoview to eval for ischemia.  THis showed small region of disttal anterior, distal inferior and apical scar  LVEF normal.  No ischemia.  REcomm medical Rx.    Patient's BP is up and down  Stopped amlodipine in past due to dizziness. Denis CP  Does get short winded with activity. He was last seen in May.   No Known Allergies  Current Outpatient Prescriptions  Medication Sig Dispense Refill  . ALPRAZolam (XANAX) 0.25 MG tablet Take 1 tablet (0.25 mg total) by mouth 2 (two) times daily as needed for anxiety.  30 tablet  1  . aspirin 81 MG tablet Take 81 mg by mouth at bedtime.      . clopidogrel (PLAVIX) 75 MG tablet Take 1 tablet by mouth  daily  90 tablet  0  . furosemide (LASIX) 20 MG tablet Take 40 mg once a week and 20 mg on all other days.  105 tablet  1  . losartan (COZAAR) 50 MG tablet Take 1 tablet (50 mg total) by mouth 2 (two) times daily.  180 tablet  3  . nitroGLYCERIN (NITROSTAT) 0.4 MG SL tablet Place 1 tablet (0.4 mg total) under the tongue every 5 (five) minutes x 3 doses as needed for chest pain.  30 tablet  4  . pravastatin (PRAVACHOL) 20 MG tablet Take 1 tablet (20 mg total) by mouth daily.  90 tablet  3   No current facility-administered medications for this visit.    Past Medical History  Diagnosis Date  . HTN (hypertension)   . HLD (hyperlipidemia)   . Edema   . Benign neoplasm of colon   . Dyspnea on exertion   . Prostate cancer   . Osteopenia   . Emphysema   . Bronchitis   . Obesity   . Malaise and fatigue   . CAD (coronary artery disease) Dec 2011    s/p CABG per Dr. Roxy Manns; had normal EF; All SVGs occluded per follow up cath with only LIMA  to LAD patent; s/p PCI of the proximal and mid LAD February 2014 per Dr. Burt Knack    Past Surgical History  Procedure Laterality Date  . Median sternotomy  08/23/10  . Coronary artery bypass graft  08/23/10  . Cervical disc surgery    . Cardiac catheterization  10/14/12  . Coronary stent placement  10/14/12    with stent to proximal and mid LAD    Family History  Problem Relation Age of Onset  . Heart disease Brother   . Skin cancer Brother   . Prostate cancer Brother   . Kidney cancer Father   . Heart attack Father     History   Social History  . Marital Status: Married    Spouse Name: N/A    Number of Children: N/A  . Years of Education: N/A   Occupational History  . retired Furniture conservator/restorer    Social History Main Topics  . Smoking status: Former Research scientist (life sciences)  . Smokeless tobacco: Not on file     Comment: reported that he chews tobacco 11/10/12  . Alcohol Use: No  . Drug Use: No  .  Sexual Activity: Not Currently   Other Topics Concern  . Not on file   Social History Narrative  . No narrative on file    Review of Systems:  All systems reviewed.  They are negative to the above problem except as previously stated.  Vital Signs: BP 114/68  Pulse 52  Physical Exam Patient is in NAD HEENT:  Normocephalic, atrauma Chest:  Small region of erythema L upper parasternal region. Lungs: clear to auscultation. No rales no wheezes.  Heart: Regular rate and rhythm. Normal S1, S2. No S3.   No significant murmurs. PMI not displaced.  Abdomen:  Supple, nontender. Normal bowel sounds. No masses. No hepatomegaly.  Extremities:   Good distal pulses throughout. No lower extremity edema.  Musculoskeletal :moving all extremities.  Neuro:   alert and oriented x3.  CN II-XII grossly intact.  EKG  SB 52 bpm  First degree AV block  PR 236 msec  RBBB.  LVH.   Assessment and Plan:  1.  CAD  I would keep on same meds   He does have occluded grafts so SOB is not unexpected.   I recommm he try to  lose wt  He admits to eating a lot of carbs    2.  HTN  Good today.   Keep on same meds Very labile   I would not push too low.   4. HL  Continue statin  Check lipids  Will f/u in April

## 2014-05-20 ENCOUNTER — Ambulatory Visit (INDEPENDENT_AMBULATORY_CARE_PROVIDER_SITE_OTHER): Payer: Medicare Other | Admitting: Internal Medicine

## 2014-05-20 VITALS — BP 114/68 | HR 52

## 2014-05-20 DIAGNOSIS — Z79899 Other long term (current) drug therapy: Secondary | ICD-10-CM | POA: Diagnosis not present

## 2014-05-20 DIAGNOSIS — E785 Hyperlipidemia, unspecified: Secondary | ICD-10-CM

## 2014-05-20 DIAGNOSIS — I2510 Atherosclerotic heart disease of native coronary artery without angina pectoris: Secondary | ICD-10-CM | POA: Diagnosis not present

## 2014-05-20 NOTE — Patient Instructions (Signed)
Your physician recommends that you continue on your current medications as directed. Please refer to the Current Medication list given to you today. Your physician recommends that you return for lab work on 06/01/14.  (LIPIDS, CBC, AST) Your physician wants you to follow-up in: May 2016.   You will receive a reminder letter in the mail two months in advance. If you don't receive a letter, please call our office to schedule the follow-up appointment.

## 2014-05-25 DIAGNOSIS — Z23 Encounter for immunization: Secondary | ICD-10-CM | POA: Diagnosis not present

## 2014-06-01 ENCOUNTER — Other Ambulatory Visit (INDEPENDENT_AMBULATORY_CARE_PROVIDER_SITE_OTHER): Payer: Medicare Other

## 2014-06-01 DIAGNOSIS — E785 Hyperlipidemia, unspecified: Secondary | ICD-10-CM | POA: Diagnosis not present

## 2014-06-01 DIAGNOSIS — Z79899 Other long term (current) drug therapy: Secondary | ICD-10-CM

## 2014-06-01 DIAGNOSIS — C61 Malignant neoplasm of prostate: Secondary | ICD-10-CM | POA: Diagnosis not present

## 2014-06-01 LAB — CBC
HEMATOCRIT: 41.9 % (ref 39.0–52.0)
Hemoglobin: 13.9 g/dL (ref 13.0–17.0)
MCHC: 33.1 g/dL (ref 30.0–36.0)
MCV: 92.4 fl (ref 78.0–100.0)
Platelets: 210 10*3/uL (ref 150.0–400.0)
RBC: 4.54 Mil/uL (ref 4.22–5.81)
RDW: 13.4 % (ref 11.5–15.5)
WBC: 7.5 10*3/uL (ref 4.0–10.5)

## 2014-06-01 LAB — LIPID PANEL
CHOL/HDL RATIO: 3
Cholesterol: 113 mg/dL (ref 0–200)
HDL: 32.4 mg/dL — ABNORMAL LOW (ref >39.00)
LDL CALC: 58 mg/dL (ref 0–99)
NonHDL: 80.6
TRIGLYCERIDES: 115 mg/dL (ref 0.0–149.0)
VLDL: 23 mg/dL (ref 0.0–40.0)

## 2014-06-01 LAB — AST: AST: 19 U/L (ref 0–37)

## 2014-06-02 DIAGNOSIS — Z85828 Personal history of other malignant neoplasm of skin: Secondary | ICD-10-CM | POA: Diagnosis not present

## 2014-06-02 DIAGNOSIS — L570 Actinic keratosis: Secondary | ICD-10-CM | POA: Diagnosis not present

## 2014-06-02 DIAGNOSIS — D485 Neoplasm of uncertain behavior of skin: Secondary | ICD-10-CM | POA: Diagnosis not present

## 2014-06-02 DIAGNOSIS — D1801 Hemangioma of skin and subcutaneous tissue: Secondary | ICD-10-CM | POA: Diagnosis not present

## 2014-06-02 DIAGNOSIS — L821 Other seborrheic keratosis: Secondary | ICD-10-CM | POA: Diagnosis not present

## 2014-06-02 DIAGNOSIS — C44311 Basal cell carcinoma of skin of nose: Secondary | ICD-10-CM | POA: Diagnosis not present

## 2014-06-02 DIAGNOSIS — D492 Neoplasm of unspecified behavior of bone, soft tissue, and skin: Secondary | ICD-10-CM | POA: Diagnosis not present

## 2014-06-06 ENCOUNTER — Telehealth: Payer: Self-pay | Admitting: Internal Medicine

## 2014-06-06 NOTE — Telephone Encounter (Signed)
Pt made aware of results by phone.

## 2014-06-06 NOTE — Telephone Encounter (Signed)
Follow up   Pt called for lab results please give him a call back.

## 2014-06-08 DIAGNOSIS — C61 Malignant neoplasm of prostate: Secondary | ICD-10-CM | POA: Diagnosis not present

## 2014-06-10 DIAGNOSIS — M19032 Primary osteoarthritis, left wrist: Secondary | ICD-10-CM | POA: Diagnosis not present

## 2014-06-17 ENCOUNTER — Other Ambulatory Visit: Payer: Self-pay | Admitting: Internal Medicine

## 2014-06-29 DIAGNOSIS — M19032 Primary osteoarthritis, left wrist: Secondary | ICD-10-CM | POA: Diagnosis not present

## 2014-06-30 ENCOUNTER — Emergency Department (HOSPITAL_COMMUNITY): Payer: Medicare Other

## 2014-06-30 ENCOUNTER — Observation Stay (HOSPITAL_COMMUNITY)
Admission: EM | Admit: 2014-06-30 | Discharge: 2014-07-02 | Disposition: A | Discharge status: Home / Self Care | Payer: Medicare Other | Attending: Internal Medicine | Admitting: Internal Medicine

## 2014-06-30 ENCOUNTER — Other Ambulatory Visit: Payer: Self-pay | Admitting: Orthopedic Surgery

## 2014-06-30 DIAGNOSIS — E785 Hyperlipidemia, unspecified: Secondary | ICD-10-CM | POA: Insufficient documentation

## 2014-06-30 DIAGNOSIS — I2510 Atherosclerotic heart disease of native coronary artery without angina pectoris: Secondary | ICD-10-CM | POA: Diagnosis not present

## 2014-06-30 DIAGNOSIS — I10 Essential (primary) hypertension: Secondary | ICD-10-CM | POA: Insufficient documentation

## 2014-06-30 DIAGNOSIS — R531 Weakness: Secondary | ICD-10-CM | POA: Diagnosis not present

## 2014-06-30 DIAGNOSIS — I480 Paroxysmal atrial fibrillation: Secondary | ICD-10-CM | POA: Diagnosis present

## 2014-06-30 DIAGNOSIS — R55 Syncope and collapse: Secondary | ICD-10-CM | POA: Diagnosis not present

## 2014-06-30 DIAGNOSIS — I4891 Unspecified atrial fibrillation: Secondary | ICD-10-CM | POA: Diagnosis not present

## 2014-06-30 DIAGNOSIS — Z7982 Long term (current) use of aspirin: Secondary | ICD-10-CM | POA: Insufficient documentation

## 2014-06-30 DIAGNOSIS — Z87891 Personal history of nicotine dependence: Secondary | ICD-10-CM | POA: Insufficient documentation

## 2014-06-30 DIAGNOSIS — R52 Pain, unspecified: Secondary | ICD-10-CM

## 2014-06-30 DIAGNOSIS — Z951 Presence of aortocoronary bypass graft: Secondary | ICD-10-CM | POA: Insufficient documentation

## 2014-06-30 DIAGNOSIS — I200 Unstable angina: Secondary | ICD-10-CM

## 2014-06-30 DIAGNOSIS — R404 Transient alteration of awareness: Secondary | ICD-10-CM | POA: Diagnosis not present

## 2014-06-30 DIAGNOSIS — Z79899 Other long term (current) drug therapy: Secondary | ICD-10-CM | POA: Insufficient documentation

## 2014-06-30 DIAGNOSIS — Z7902 Long term (current) use of antithrombotics/antiplatelets: Secondary | ICD-10-CM | POA: Diagnosis not present

## 2014-06-30 DIAGNOSIS — C61 Malignant neoplasm of prostate: Secondary | ICD-10-CM | POA: Diagnosis not present

## 2014-06-30 DIAGNOSIS — I700 Atherosclerosis of aorta: Secondary | ICD-10-CM | POA: Diagnosis not present

## 2014-06-30 LAB — COMPREHENSIVE METABOLIC PANEL
ALT: 15 U/L (ref 0–53)
AST: 19 U/L (ref 0–37)
Albumin: 3.7 g/dL (ref 3.5–5.2)
Alkaline Phosphatase: 102 U/L (ref 39–117)
Anion gap: 13 (ref 5–15)
BUN: 16 mg/dL (ref 6–23)
CALCIUM: 9.1 mg/dL (ref 8.4–10.5)
CO2: 26 mEq/L (ref 19–32)
Chloride: 102 mEq/L (ref 96–112)
Creatinine, Ser: 1.05 mg/dL (ref 0.50–1.35)
GFR calc non Af Amer: 64 mL/min — ABNORMAL LOW (ref >90)
GFR, EST AFRICAN AMERICAN: 74 mL/min — AB (ref >90)
Glucose, Bld: 127 mg/dL — ABNORMAL HIGH (ref 70–99)
Potassium: 4 mEq/L (ref 3.7–5.3)
Sodium: 141 mEq/L (ref 137–147)
TOTAL PROTEIN: 7 g/dL (ref 6.0–8.3)
Total Bilirubin: 0.4 mg/dL (ref 0.3–1.2)

## 2014-06-30 LAB — I-STAT TROPONIN, ED: TROPONIN I, POC: 0.04 ng/mL (ref 0.00–0.08)

## 2014-06-30 LAB — CBC
HCT: 42.9 % (ref 39.0–52.0)
Hemoglobin: 14.5 g/dL (ref 13.0–17.0)
MCH: 30.6 pg (ref 26.0–34.0)
MCHC: 33.8 g/dL (ref 30.0–36.0)
MCV: 90.5 fL (ref 78.0–100.0)
PLATELETS: 217 10*3/uL (ref 150–400)
RBC: 4.74 MIL/uL (ref 4.22–5.81)
RDW: 12.9 % (ref 11.5–15.5)
WBC: 10 10*3/uL (ref 4.0–10.5)

## 2014-06-30 LAB — PROTIME-INR
INR: 1.07 (ref 0.00–1.49)
Prothrombin Time: 14 seconds (ref 11.6–15.2)

## 2014-06-30 LAB — APTT: aPTT: 32 seconds (ref 24–37)

## 2014-06-30 MED ORDER — LOSARTAN POTASSIUM 50 MG PO TABS
50.0000 mg | ORAL_TABLET | Freq: Two times a day (BID) | ORAL | Status: DC
  Administered 2014-06-30 – 2014-07-02 (×4): 50 mg via ORAL
  Filled 2014-06-30 (×5): qty 1

## 2014-06-30 MED ORDER — ONDANSETRON HCL 4 MG PO TABS: 4.0000 mg | ORAL_TABLET | Freq: Four times a day (QID) | ORAL | Status: DC | PRN

## 2014-06-30 MED ORDER — ALPRAZOLAM 0.25 MG PO TABS: 0.2500 mg | ORAL_TABLET | Freq: Two times a day (BID) | ORAL | Status: DC | PRN

## 2014-06-30 MED ORDER — SODIUM CHLORIDE 0.9 % IJ SOLN
3.0000 mL | Freq: Two times a day (BID) | INTRAMUSCULAR | Status: DC
  Administered 2014-06-30 – 2014-07-02 (×4): 3 mL via INTRAVENOUS

## 2014-06-30 MED ORDER — PRAVASTATIN SODIUM 20 MG PO TABS
20.0000 mg | ORAL_TABLET | Freq: Every day | ORAL | Status: DC
  Administered 2014-06-30 – 2014-07-01 (×2): 20 mg via ORAL
  Filled 2014-06-30 (×3): qty 1

## 2014-06-30 MED ORDER — ENOXAPARIN SODIUM 40 MG/0.4ML ~~LOC~~ SOLN
40.0000 mg | SUBCUTANEOUS | Status: DC
  Administered 2014-06-30 – 2014-07-01 (×2): 40 mg via SUBCUTANEOUS
  Filled 2014-06-30 (×3): qty 0.4

## 2014-06-30 MED ORDER — CLOPIDOGREL BISULFATE 75 MG PO TABS
75.0000 mg | ORAL_TABLET | Freq: Every day | ORAL | Status: DC
  Administered 2014-06-30 – 2014-07-02 (×3): 75 mg via ORAL
  Filled 2014-06-30 (×3): qty 1

## 2014-06-30 MED ORDER — SODIUM CHLORIDE 0.9 % IV SOLN
1000.0000 mL | INTRAVENOUS | Status: DC
  Administered 2014-06-30: 1000 mL via INTRAVENOUS

## 2014-06-30 MED ORDER — ISOSORBIDE MONONITRATE ER 30 MG PO TB24
30.0000 mg | ORAL_TABLET | Freq: Every day | ORAL | Status: DC
  Administered 2014-07-01 – 2014-07-02 (×2): 30 mg via ORAL
  Filled 2014-06-30 (×2): qty 1

## 2014-06-30 MED ORDER — NITROGLYCERIN 0.4 MG SL SUBL: 0.4000 mg | SUBLINGUAL_TABLET | SUBLINGUAL | Status: DC | PRN

## 2014-06-30 MED ORDER — ACETAMINOPHEN 325 MG PO TABS: 650.0000 mg | ORAL_TABLET | Freq: Four times a day (QID) | ORAL | Status: DC | PRN

## 2014-06-30 MED ORDER — ASPIRIN EC 81 MG PO TBEC
81.0000 mg | DELAYED_RELEASE_TABLET | Freq: Every day | ORAL | Status: DC
  Administered 2014-06-30 – 2014-07-01 (×2): 81 mg via ORAL
  Filled 2014-06-30 (×3): qty 1

## 2014-06-30 MED ORDER — ONDANSETRON HCL 4 MG/2ML IJ SOLN: 4.0000 mg | Freq: Four times a day (QID) | INTRAMUSCULAR | Status: DC | PRN

## 2014-06-30 MED ORDER — ACETAMINOPHEN 650 MG RE SUPP: 650.0000 mg | Freq: Four times a day (QID) | RECTAL | Status: DC | PRN

## 2014-06-30 NOTE — ED Provider Notes (Signed)
CSN: [id number]     Arrival date & time 06/30/14  1626 History   First MD Initiated Contact with Patient 06/30/14 1638     Chief Complaint  Patient presents with  . Weakness  . Shortness of Breath  . Dizziness   HPI Pt was outside working in the yard when he suddenly started feeling weak.  He became hot and nauseated.  No chest pain. He had an episode of vomiting.  He felt like he was going to pass out.  He got back in his golf cart and went to his house to lay down.   At baseline he has trouble with dyspnea on exertion but has not noticed any recent changes.  He has seen his heart doctors but does not know what has caused it. Earlier in the day he did feel fine.  No pain.  No trouble with his appetite.  This past weekend he did have a similar event that was not as severe. Past Medical History  Diagnosis Date  . HTN (hypertension)   . HLD (hyperlipidemia)   . Edema   . Benign neoplasm of colon   . Dyspnea on exertion   . Prostate cancer   . Osteopenia   . Emphysema   . Bronchitis   . Obesity   . Malaise and fatigue   . CAD (coronary artery disease) Dec 2011    s/p CABG per Dr. Roxy Manns; had normal EF; All SVGs occluded per follow up cath with only LIMA to LAD patent; s/p PCI of the proximal and mid LAD February 2014 per Dr. Burt Knack   Past Surgical History  Procedure Laterality Date  . Median sternotomy  08/23/10  . Coronary artery bypass graft  08/23/10  . Cervical disc surgery    . Cardiac catheterization  10/14/12  . Coronary stent placement  10/14/12    with stent to proximal and mid LAD   Family History  Problem Relation Age of Onset  . Heart disease Brother   . Skin cancer Brother   . Prostate cancer Brother   . Kidney cancer Father   . Heart attack Father    History  Substance Use Topics  . Smoking status: Former Research scientist (life sciences)  . Smokeless tobacco: Not on file     Comment: reported that he chews tobacco 11/10/12  . Alcohol Use: No    Review of Systems  All other systems  reviewed and are negative.     Allergies  Review of patient's allergies indicates no known allergies.  Home Medications   Prior to Admission medications   Medication Sig Start Date End Date Taking? Authorizing Provider  ALPRAZolam (XANAX) 0.25 MG tablet Take 0.25 mg by mouth 2 (two) times daily as needed for anxiety. 09/02/12  Yes Lendon Colonel, NP  aspirin 81 MG tablet Take 81 mg by mouth at bedtime.   Yes Historical Provider, MD  clopidogrel (PLAVIX) 75 MG tablet Take 1 tablet by mouth  daily 06/17/14  Yes [name], MD  furosemide (LASIX) 20 MG tablet Take 40 mg once a week and 20 mg on all other days. 11/29/13  Yes [name], MD  isosorbide mononitrate (IMDUR) 30 MG 24 hr tablet Take 30 mg by mouth daily.   Yes Historical Provider, MD  losartan (COZAAR) 50 MG tablet Take 50 mg by mouth 2 (two) times daily. 02/18/14  Yes [name], MD  pravastatin (PRAVACHOL) 20 MG tablet Take 20 mg by mouth daily. 11/29/13 11/29/14 Yes Nevin Bloodgood  Alcus Dad, MD  nitroGLYCERIN (NITROSTAT) 0.4 MG SL tablet Place 0.4 mg under the tongue every 5 (five) minutes x 3 doses as needed for chest pain. 08/09/13   [name], MD   BP 146/70  Pulse 52  Temp(Src) 97.7 F (36.5 C) (Oral)  Resp 21  Ht 5\' 10"  (1.778 m)  Wt 267 lb (121.11 kg)  BMI 38.31 kg/m2  SpO2 94% Physical Exam  Nursing note and vitals reviewed. Constitutional: No distress.  HENT:  Head: Normocephalic and atraumatic.  Right Ear: External ear normal.  Left Ear: External ear normal.  Eyes: Conjunctivae are normal. Right eye exhibits no discharge. Left eye exhibits no discharge. No scleral icterus.  Neck: Neck supple. No tracheal deviation present.  Cardiovascular: Normal rate, regular rhythm and intact distal pulses.   Pulmonary/Chest: Effort normal and breath sounds normal. No stridor. No respiratory distress. He has no wheezes. He has no rales.  Midline sternotomy scar   Abdominal: Soft. Bowel sounds are normal. He exhibits no  distension. There is no tenderness. There is no rebound and no guarding.  Musculoskeletal: He exhibits edema (trace le). He exhibits no tenderness.  Neurological: He is alert. He has normal strength. No cranial nerve deficit (no facial droop, extraocular movements intact, no slurred speech) or sensory deficit. He exhibits normal muscle tone. He displays no seizure activity. Coordination normal.  Skin: Skin is warm. No rash noted. He is diaphoretic.  Psychiatric: He has a normal mood and affect.    ED Course  Procedures (including critical care time) Labs Review Labs Reviewed  COMPREHENSIVE METABOLIC PANEL - Abnormal; Notable for the following:    Glucose, Bld 127 (*)    GFR calc non Af Amer 64 (*)    GFR calc Af Amer 74 (*)    All other components within normal limits  APTT  CBC  PROTIME-INR  I-STAT TROPOININ, ED    Imaging Review Dg Chest Portable 1 View  06/30/2014   CLINICAL DATA:  80 year old male with shortness of breath. Hypertension. Prostate cancer. Nonsmoker. Initial encounter.  EXAM: PORTABLE CHEST - 1 VIEW  COMPARISON:  08/17/2013.  FINDINGS: Cardiomegaly post CABG.  Mild central pulmonary vascular prominence without frank pulmonary edema.  No segmental consolidation or gross pneumothorax.  Calcified tortuous aorta.  IMPRESSION: Post CABG.  Cardiomegaly.  Mild central pulmonary vascular prominence without frank pulmonary edema.  No segmental consolidation.  Calcified tortuous aorta.   Electronically Signed   By: Chauncey Cruel M.D.   On: 06/30/2014 17:22     EKG Interpretation   Date/Time:  Thursday June 30 2014 16:35:13 EDT Ventricular Rate:  52 PR Interval:  228 QRS Duration: 165 QT Interval:  519 QTC Calculation: 483 R Axis:   -42 Text Interpretation:  Sinus rhythm Prolonged PR interval RBBB and LAFB  Left ventricular hypertrophy No significant change since last tracing  Confirmed by Beautifull Cisar  MD-J, Corabelle Spackman ([id number]) on 06/30/2014 4:43:08 PM      MDM   Final  diagnoses:  Syncope, unspecified syncope type  Weakness    Pt presented to ED with an episode of near syncope, diaphoresis, nausea.  Concerning for possible anginal equivalent.  Could have had a cardiac dysrhythmia.  Will consult with medical service regarding admission.    Dorie Rank, MD 06/30/14 [phone]

## 2014-06-30 NOTE — ED Notes (Signed)
Port chest xray at bedside

## 2014-06-30 NOTE — ED Notes (Signed)
Dr.Knapp at bedside  

## 2014-06-30 NOTE — ED Notes (Signed)
Gave pt a [place] sandwich and coke.

## 2014-06-30 NOTE — ED Notes (Signed)
Per EMS: patient was collecting pecans with a pickeruper thing (was not bending down or exerting, using a tool) when he became very hot, generalized weakness, dizziness, very nauseated with 1 episode of vomiting after dry retching. EMS was called, patient was pale, diaphoretic, hot to touch with EMS when they arrived. hx of 2 NSTEMIs, CABG, 2 Stents. Hx of htn.  12 lead unremarkable other than 1st degree block.  Given 324 ASA en route, 4 zofran.  Nausea resolved with zofran but returned when patient arrived in room here in ED.  Similar episode occurred on Saturday, patient also had 3 or 4 episodes of diarrhea on that day as well.  Diarrhea has resolved since that time, denies chest pain or having chest pain during this event.  No NTG was given.

## 2014-06-30 NOTE — H&P (Signed)
[facility] History and Physical  Patient: Levi [name]  DGU:[id number]  DOB: February 10, 1929  DOS: the patient was seen and examined on 06/30/2014 PCP: [name], MD  Chief Complaint: Dizziness and lightheadedness  HPI: Levi [name] is a 80 y.o. male with Past medical history of hypertension, dyslipidemia, coronary artery disease. The patient presented with complaints of dizziness and lightheadedness. He mentions that he was working outside in the yard at which time he started feeling suddenly weak and had nausea. He started having sweating and shortness of breath. He also felt dizzy and lightheaded without any vertigo. He had an episode of vomiting. He felt he was going to pass out and had blacking out. He sat on his golf cart and when he felt better and he tried to drive back home. He had similar episode of dizziness 6 months ago. He denies any focal deficit he denies any chest pain he denies any changes in his medication. He had loose bowel motions motions Saturday.  The patient is coming from home. And at his baseline independent for most of his ADL.  Review of Systems: as mentioned in the history of present illness.  A Comprehensive review of the other systems is negative.  Past Medical History  Diagnosis Date  . HTN (hypertension)   . HLD (hyperlipidemia)   . Edema   . Benign neoplasm of colon   . Dyspnea on exertion   . Prostate cancer   . Osteopenia   . Emphysema   . Bronchitis   . Obesity   . Malaise and fatigue   . CAD (coronary artery disease) Dec 2011    s/p CABG per Dr. Roxy Manns; had normal EF; All SVGs occluded per follow up cath with only LIMA to LAD patent; s/p PCI of the proximal and mid LAD February 2014 per Dr. Burt Knack   Past Surgical History  Procedure Laterality Date  . Median sternotomy  08/23/10  . Coronary artery bypass graft  08/23/10  . Cervical disc surgery    . Cardiac catheterization  10/14/12  . Coronary stent placement  10/14/12    with  stent to proximal and mid LAD   Social History:  reports that he has quit smoking. He does not have any smokeless tobacco history on file. He reports that he does not drink alcohol or use illicit drugs.  No Known Allergies  Family History  Problem Relation Age of Onset  . Heart disease Brother   . Skin cancer Brother   . Prostate cancer Brother   . Kidney cancer Father   . Heart attack Father     Prior to Admission medications   Medication Sig Start Date End Date Taking? Authorizing Provider  ALPRAZolam (XANAX) 0.25 MG tablet Take 0.25 mg by mouth 2 (two) times daily as needed for anxiety. 09/02/12  Yes Lendon Colonel, NP  aspirin 81 MG tablet Take 81 mg by mouth at bedtime.   Yes Historical Provider, MD  clopidogrel (PLAVIX) 75 MG tablet Take 1 tablet by mouth  daily 06/17/14  Yes [name], MD  furosemide (LASIX) 20 MG tablet Take 40 mg once a week and 20 mg on all other days. 11/29/13  Yes [name], MD  isosorbide mononitrate (IMDUR) 30 MG 24 hr tablet Take 30 mg by mouth daily.   Yes Historical Provider, MD  losartan (COZAAR) 50 MG tablet Take 50 mg by mouth 2 (two) times daily. 02/18/14  Yes [name], MD  pravastatin (PRAVACHOL)  20 MG tablet Take 20 mg by mouth daily. 11/29/13 11/29/14 Yes [name], MD  nitroGLYCERIN (NITROSTAT) 0.4 MG SL tablet Place 0.4 mg under the tongue every 5 (five) minutes x 3 doses as needed for chest pain. 08/09/13   [name], MD    Physical Exam: Filed Vitals:   06/30/14 1830 06/30/14 1900 06/30/14 1932 06/30/14 2014  BP: 178/83 153/70 168/70 185/63  Pulse: 54 51 55 55  Temp:    97.8 F (36.6 C)  TempSrc:    Oral  Resp: 15 23 19 18   Height:    5\' 9"  (1.753 m)  Weight:    120.6 kg (265 lb 14 oz)  SpO2: 97% 94% 96% 96%    General: Alert, Awake and Oriented to Time, Place and Person. Appear in mild distress Eyes: PERRL ENT: Oral Mucosa clear moist. Neck: no JVD Cardiovascular: S1 and S2 Present, no Murmur, Peripheral Pulses  Present Respiratory: Bilateral Air entry equal and Decreased, Clear to Auscultation, noCrackles, no wheezes Abdomen: Bowel Sound present , Soft and no tender Skin: no Rash Extremities: no Pedal edema, no calf tenderness Neurologic: Grossly no focal neuro deficit.  Labs on Admission:  CBC:  Recent Labs Lab 06/30/14 1705  WBC 10.0  HGB 14.5  HCT 42.9  MCV 90.5  PLT 217    CMP     Component Value Date/Time   NA 141 06/30/2014 1705   K 4.0 06/30/2014 1705   CL 102 06/30/2014 1705   CO2 26 06/30/2014 1705   GLUCOSE 127* 06/30/2014 1705   BUN 16 06/30/2014 1705   CREATININE 1.05 06/30/2014 1705   CALCIUM 9.1 06/30/2014 1705   PROT 7.0 06/30/2014 1705   ALBUMIN 3.7 06/30/2014 1705   AST 19 06/30/2014 1705   ALT 15 06/30/2014 1705   ALKPHOS 102 06/30/2014 1705   BILITOT 0.4 06/30/2014 1705   GFRNONAA 64* 06/30/2014 1705   GFRAA 74* 06/30/2014 1705    No results found for this basename: LIPASE, AMYLASE,  in the last 168 hours No results found for this basename: AMMONIA,  in the last 168 hours  No results found for this basename: CKTOTAL, CKMB, CKMBINDEX, TROPONINI,  in the last 168 hours BNP (last 3 results) No results found for this basename: PROBNP,  in the last 8760 hours  Radiological Exams on Admission: Dg Chest Portable 1 View  06/30/2014   CLINICAL DATA:  80 year old male with shortness of breath. Hypertension. Prostate cancer. Nonsmoker. Initial encounter.  EXAM: PORTABLE CHEST - 1 VIEW  COMPARISON:  08/17/2013.  FINDINGS: Cardiomegaly post CABG.  Mild central pulmonary vascular prominence without frank pulmonary edema.  No segmental consolidation or gross pneumothorax.  Calcified tortuous aorta.  IMPRESSION: Post CABG.  Cardiomegaly.  Mild central pulmonary vascular prominence without frank pulmonary edema.  No segmental consolidation.  Calcified tortuous aorta.   Electronically Signed   By: Chauncey Cruel M.D.   On: 06/30/2014 17:22    EKG: Independently reviewed.  normal sinus rhythm, RBBB, LAFB.  Assessment/Plan Principal Problem:   Near syncope Active Problems:   Essential hypertension   Atrial fibrillation   CAD (coronary artery disease)   1. Near syncope The patient is presenting with near syncopal event. He reportedly had first-degree heart block when initially was seen by EMS. At present the patient's EKG shows left anterior fascicular block as well as RBBB. Both has been seen earlier. Patient at present remains asymptomatic. We will continue to monitor him on telemetry to rule out  any other cardiac arrhythmia. PT OT consultation will also be done.  2. Essential hypertension. Continue home medications. At present patient is not orthostatic.  3. Dyslipidemia. Continue protocol.  4. Coronary artery disease. EKG does not show any evidence of ischemia. Initial troponin negative. Continue to monitor.  Advance goals of care discussion: Full code as per my discussion with patient   DVT Prophylaxis: subcutaneous Heparin Nutrition: Cardiac diet  Family Communication: Family was present at bedside, opportunity was given to ask question and all questions were answered satisfactorily at the time of interview. Disposition: Admitted to observation in telemetry unit.  Author: Berle Mull, MD [facility] Pager: [phone] 06/30/2014, 8:39 PM    If 7PM-7AM, please contact night-coverage [email or web address] Password [email or web address]

## 2014-07-01 DIAGNOSIS — R55 Syncope and collapse: Secondary | ICD-10-CM | POA: Diagnosis not present

## 2014-07-01 LAB — PROTIME-INR
INR: 1.1 (ref 0.00–1.49)
Prothrombin Time: 14.4 seconds (ref 11.6–15.2)

## 2014-07-01 LAB — CBC
HCT: 40.6 % (ref 39.0–52.0)
Hemoglobin: 13.7 g/dL (ref 13.0–17.0)
MCH: 30.5 pg (ref 26.0–34.0)
MCHC: 33.7 g/dL (ref 30.0–36.0)
MCV: 90.4 fL (ref 78.0–100.0)
PLATELETS: 200 10*3/uL (ref 150–400)
RBC: 4.49 MIL/uL (ref 4.22–5.81)
RDW: 13 % (ref 11.5–15.5)
WBC: 7.5 10*3/uL (ref 4.0–10.5)

## 2014-07-01 LAB — COMPREHENSIVE METABOLIC PANEL
ALBUMIN: 3.1 g/dL — AB (ref 3.5–5.2)
ALT: 12 U/L (ref 0–53)
ANION GAP: 11 (ref 5–15)
AST: 16 U/L (ref 0–37)
Alkaline Phosphatase: 92 U/L (ref 39–117)
BUN: 16 mg/dL (ref 6–23)
CALCIUM: 8.7 mg/dL (ref 8.4–10.5)
CHLORIDE: 107 meq/L (ref 96–112)
CO2: 25 mEq/L (ref 19–32)
CREATININE: 1.05 mg/dL (ref 0.50–1.35)
GFR calc Af Amer: 74 mL/min — ABNORMAL LOW (ref >90)
GFR calc non Af Amer: 64 mL/min — ABNORMAL LOW (ref >90)
Glucose, Bld: 97 mg/dL (ref 70–99)
Potassium: 4.3 mEq/L (ref 3.7–5.3)
Sodium: 143 mEq/L (ref 137–147)
Total Bilirubin: 0.4 mg/dL (ref 0.3–1.2)
Total Protein: 6.3 g/dL (ref 6.0–8.3)

## 2014-07-01 LAB — TROPONIN I: Troponin I: <0.3 ng/mL (ref <0.30)

## 2014-07-01 NOTE — Progress Notes (Signed)
UR completed 

## 2014-07-01 NOTE — Progress Notes (Addendum)
[facility]  This is an 80 y/o male admitted this morning for a temporary lightheaded sensation. No chest pain ,dyspnea or palpitation. Orthostatics negative. Bradycardic by BP stable. EKG shows 1st degree AV block with LVH and RBBB- unchanged from baseline.  Symptoms have resolved.  Cont to follow on tele 24 hrs to follow for arrythmias and check 2D ECHO. Plan discussed with patient and family. Patient examined- he is able to ambulate without recurrence of symptoms. Will re-evaluate in AM.   Debbe Odea, MD

## 2014-07-02 DIAGNOSIS — R55 Syncope and collapse: Secondary | ICD-10-CM | POA: Diagnosis not present

## 2014-07-02 DIAGNOSIS — I200 Unstable angina: Secondary | ICD-10-CM

## 2014-07-02 DIAGNOSIS — I10 Essential (primary) hypertension: Secondary | ICD-10-CM | POA: Diagnosis not present

## 2014-07-02 DIAGNOSIS — I369 Nonrheumatic tricuspid valve disorder, unspecified: Secondary | ICD-10-CM

## 2014-07-02 NOTE — Progress Notes (Signed)
  Echocardiogram 2D Echocardiogram has been performed.  Alick Lecomte FRANCES 07/02/2014, 10:15 AM

## 2014-07-02 NOTE — Discharge Summary (Signed)
Physician Discharge Summary  KATHRYN LINAREZ VOH:[id number] DOB: 08/21/1929 DOA: 06/30/2014  PCP: [name], MD  Admit date: 06/30/2014 Discharge date: 07/02/2014  Time spent: >45 minutes  Recommendations for Outpatient Follow-up:  1. ECHO completed- patient wanting to leave the hospital without waiting for the results.   Discharge Condition: stable Diet recommendation: heart healthy  Discharge Diagnoses:  Principal Problem:   Near syncope Active Problems:   Essential hypertension   Atrial fibrillation   CAD (coronary artery disease)   History of present illness:  Levi [name] is a 80 y.o. male with Past medical history of hypertension, dyslipidemia, coronary artery disease with h/o CABG.  The patient presented with complaints of dizziness and lightheadedness. He mentions that he was working outside in the yard at which time he started feeling suddenly weak and had nausea. He started having sweating and shortness of breath and had an episode of vomiting. He felt he was going to pass out but did not actually have syncope. He sat on his golf cart and when he felt better and he tried to drive back home. He did not have chest pain, palpitations or neurological symptoms during this episode. He recalled having a similar episode of nausea at home a few days earlier but this was in relation to a severe episode of diarrhea that occurred at the same time as the nausea.    Hospital Course:  Dizziness/ shortness of breath/ diaphoresis - one episode which resolved spontaneously -Although he denied chest pain and palpitations, we have trended troponins to r/o MI and have monitored on telemeter for arrhythmias he has been stable- Troponins negative- no recurrence of symptoms with ambulation in the hospital - I did also order an ECHO to rule out valvular abnormalities but he is not willing to wait on the results, saying he is doing well now and would like to go home as soon as possible - he  has chronic dyspnea on exertion which his cardiologist notes as well on the last progress note- recent stress test (12/07/13) was negative and therefore, we did not consult cardiology at this time.  - he is advised if these episodes continue, he should discuss it with his PCP as soon as possible - will f/u on ECHO and make patient aware if it is abnormal  His remaining medical issues remained stable during the hospital stay.     Procedures:  ECHO  Consultations:  none  Discharge Exam: Filed Weights   06/30/14 2014 07/01/14 0500 07/02/14 0458  Weight: 120.6 kg (265 lb 14 oz) 120.158 kg (264 lb 14.4 oz) 120.158 kg (264 lb 14.4 oz)   Filed Vitals:   07/02/14 0458  BP: 152/61  Pulse: 51  Temp: 97.8 F (36.6 C)  Resp: 18    General: AAO x 3, no distress Cardiovascular: RRR, no murmurs  Respiratory: clear to auscultation bilaterally GI: soft, non-tender, non-distended, bowel sound positive  Discharge Instructions You were cared for by a hospitalist during your hospital stay. If you have any questions about your discharge medications or the care you received while you were in the hospital after you are discharged, you can call the unit and asked to speak with the hospitalist on call if the hospitalist that took care of you is not available. Once you are discharged, your primary care physician will handle any further medical issues. Please note that NO REFILLS for any discharge medications will be authorized once you are discharged, as it is imperative that you return to  your primary care physician (or establish a relationship with a primary care physician if you do not have one) for your aftercare needs so that they can reassess your need for medications and monitor your lab values.      Discharge Instructions   Diet - low sodium heart healthy    Complete by:  As directed      Increase activity slowly    Complete by:  As directed             Medication List          ALPRAZolam 0.25 MG tablet  Commonly known as:  XANAX  Take 0.25 mg by mouth 2 (two) times daily as needed for anxiety.     aspirin 81 MG tablet  Take 81 mg by mouth at bedtime.     clopidogrel 75 MG tablet  Commonly known as:  PLAVIX  Take 1 tablet by mouth  daily     furosemide 20 MG tablet  Commonly known as:  LASIX  Take 40 mg once a week and 20 mg on all other days.     isosorbide mononitrate 30 MG 24 hr tablet  Commonly known as:  IMDUR  Take 30 mg by mouth daily.     losartan 50 MG tablet  Commonly known as:  COZAAR  Take 50 mg by mouth 2 (two) times daily.     nitroGLYCERIN 0.4 MG SL tablet  Commonly known as:  NITROSTAT  Place 0.4 mg under the tongue every 5 (five) minutes x 3 doses as needed for chest pain.     pravastatin 20 MG tablet  Commonly known as:  PRAVACHOL  Take 20 mg by mouth daily.       No Known Allergies    The results of significant diagnostics from this hospitalization (including imaging, microbiology, ancillary and laboratory) are listed below for reference.    Significant Diagnostic Studies: Dg Chest Portable 1 View  06/30/2014   CLINICAL DATA:  80 year old male with shortness of breath. Hypertension. Prostate cancer. Nonsmoker. Initial encounter.  EXAM: PORTABLE CHEST - 1 VIEW  COMPARISON:  08/17/2013.  FINDINGS: Cardiomegaly post CABG.  Mild central pulmonary vascular prominence without frank pulmonary edema.  No segmental consolidation or gross pneumothorax.  Calcified tortuous aorta.  IMPRESSION: Post CABG.  Cardiomegaly.  Mild central pulmonary vascular prominence without frank pulmonary edema.  No segmental consolidation.  Calcified tortuous aorta.   Electronically Signed   By: Chauncey Cruel M.D.   On: 06/30/2014 17:22    Microbiology: No results found for this or any previous visit (from the past 240 hour(s)).   Labs: Basic Metabolic Panel:  Recent Labs Lab 06/30/14 1705 07/01/14 0520  NA 141 143  K 4.0 4.3  CL 102 107  CO2 26  25  GLUCOSE 127* 97  BUN 16 16  CREATININE 1.05 1.05  CALCIUM 9.1 8.7   Liver Function Tests:  Recent Labs Lab 06/30/14 1705 07/01/14 0520  AST 19 16  ALT 15 12  ALKPHOS 102 92  BILITOT 0.4 0.4  PROT 7.0 6.3  ALBUMIN 3.7 3.1*   No results found for this basename: LIPASE, AMYLASE,  in the last 168 hours No results found for this basename: AMMONIA,  in the last 168 hours CBC:  Recent Labs Lab 06/30/14 1705 07/01/14 0520  WBC 10.0 7.5  HGB 14.5 13.7  HCT 42.9 40.6  MCV 90.5 90.4  PLT 217 200   Cardiac Enzymes:  Recent Labs Lab 06/30/14 2331  07/01/14 0520 07/01/14 1139  TROPONINI <0.30 <0.30 <0.30   BNP: BNP (last 3 results) No results found for this basename: PROBNP,  in the last 8760 hours CBG: No results found for this basename: GLUCAP,  in the last 168 hours     Signed:  Debbe Odea, MD [facility] 07/02/2014, 10:59 AM

## 2014-07-02 NOTE — Progress Notes (Signed)
Pt is being discharged home. Pt has been provided with discharge instructions. RN went over instructions with the patient. Pt is being transported home by his wife.

## 2014-07-03 ENCOUNTER — Telehealth: Payer: Self-pay | Admitting: Physician Assistant

## 2014-07-03 NOTE — Telephone Encounter (Signed)
    Recently discharged from hospital .Patient was feeling "swimmyheaded" and diaphoretic today. No CP or SOB. BP 152/80. HR 54 but had been in the 40s. Now feeling fine. Not on any AV nodal blockers to stop.  I advised him to come to the ED if bradycardia and lightheadedness returns. Otherwise call the office tomorrow AM.   Angelena Form PA-C  [facility]

## 2014-07-04 ENCOUNTER — Ambulatory Visit (INDEPENDENT_AMBULATORY_CARE_PROVIDER_SITE_OTHER): Payer: Medicare Other | Admitting: Internal Medicine

## 2014-07-04 ENCOUNTER — Encounter: Payer: Self-pay | Admitting: Internal Medicine

## 2014-07-04 ENCOUNTER — Telehealth: Payer: Self-pay | Admitting: Internal Medicine

## 2014-07-04 VITALS — BP 134/90 | HR 62 | Ht 69.0 in | Wt 265.8 lb

## 2014-07-04 DIAGNOSIS — R42 Dizziness and giddiness: Secondary | ICD-10-CM | POA: Diagnosis not present

## 2014-07-04 DIAGNOSIS — I200 Unstable angina: Secondary | ICD-10-CM

## 2014-07-04 DIAGNOSIS — R2681 Unsteadiness on feet: Secondary | ICD-10-CM | POA: Diagnosis not present

## 2014-07-04 NOTE — Progress Notes (Signed)
HPI 80 year old man presenting for followup evaluation. He has coronary artery disease with prior CABG. All of his vein grafts are occluded and is last remaining graft is a LIMA to diagonal. He underwent PCI/DES to  LAD in Feb 2014  He was seen by Ezzie Dural in clinic in March.I saw him at the end of march. He had a lexiscna myoview to eval for ischemia.  THis showed small region of disttal anterior, distal inferior and apical scar  LVEF normal.  No ischemia.  REcomm medical Rx.    Patient's BP is up and down  Stopped amlodipine in past due to dizziness. Denis CP  Does get short winded with activity. He was last in September  Last week he had diarrhea during the weekend.  Later in weak he went to hosp for dizziness, n/v  No syncope Admitted  Review of orthostatics negative.  Labs neg  Echo with normal LV function. He was sent home. Since being at home he has had a couple episodes od n/v and dizziness  Again, no syncope or falls.    No Known Allergies  Current Outpatient Prescriptions  Medication Sig Dispense Refill  . ALPRAZolam (XANAX) 0.25 MG tablet Take 0.25 mg by mouth 2 (two) times daily as needed for anxiety.    Marland Kitchen aspirin 81 MG tablet Take 81 mg by mouth at bedtime.    . clopidogrel (PLAVIX) 75 MG tablet Take 1 tablet by mouth  daily    . furosemide (LASIX) 20 MG tablet Take 40 mg once a week and 20 mg on all other days.    . isosorbide mononitrate (IMDUR) 30 MG 24 hr tablet Take 30 mg by mouth daily.    Marland Kitchen losartan (COZAAR) 50 MG tablet Take 50 mg by mouth 2 (two) times daily.    . nitroGLYCERIN (NITROSTAT) 0.4 MG SL tablet Place 0.4 mg under the tongue every 5 (five) minutes x 3 doses as needed for chest pain.    . pravastatin (PRAVACHOL) 20 MG tablet Take 20 mg by mouth daily.     No current facility-administered medications for this visit.    Past Medical History  Diagnosis Date  . HTN (hypertension)   . HLD (hyperlipidemia)   . Edema   . Benign neoplasm of colon   . Dyspnea  on exertion   . Prostate cancer   . Osteopenia   . Emphysema   . Bronchitis   . Obesity   . Malaise and fatigue   . CAD (coronary artery disease) Dec 2011    s/p CABG per Dr. Roxy Manns; had normal EF; All SVGs occluded per follow up cath with only LIMA to LAD patent; s/p PCI of the proximal and mid LAD February 2014 per Dr. Burt Knack    Past Surgical History  Procedure Laterality Date  . Median sternotomy  08/23/10  . Coronary artery bypass graft  08/23/10  . Cervical disc surgery    . Cardiac catheterization  10/14/12  . Coronary stent placement  10/14/12    with stent to proximal and mid LAD    Family History  Problem Relation Age of Onset  . Heart disease Brother   . Skin cancer Brother   . Prostate cancer Brother   . Kidney cancer Father   . Heart attack Father     History   Social History  . Marital Status: Married    Spouse Name: N/A    Number of Children: N/A  . Years of Education: N/A  Occupational History  . retired Furniture conservator/restorer    Social History Main Topics  . Smoking status: Former Research scientist (life sciences)  . Smokeless tobacco: Not on file     Comment: reported that he chews tobacco 11/10/12  . Alcohol Use: No  . Drug Use: No  . Sexual Activity: Not Currently   Other Topics Concern  . Not on file   Social History Narrative    Review of Systems:  All systems reviewed.  They are negative to the above problem except as previously stated.  Vital Signs: BP 134/90 mmHg  Pulse 62  Ht 5\' 9"  (1.753 m)  Wt 265 lb 12.8 oz (120.566 kg)  BMI 39.23 kg/m2  SpO2 98%  Physical Exam Patient is in NAD HEENT:  Normocephalic, atrauma Chest:  Small region of erythema L upper parasternal region. Lungs: clear to auscultation. No rales no wheezes.  Heart: Regular rate and rhythm. Normal S1, S2. No S3.   No significant murmurs. PMI not displaced.  Abdomen:  Supple, nontender. Normal bowel sounds. No masses. No hepatomegaly.  Extremities:   Good distal pulses throughout. No lower extremity  edema.  Musculoskeletal :moving all extremities.  Neuro:   alert and oriented x3.  CN II-XII grossly intact. Gait a little unsteady.  Romberg negative  Rapid alternating movem  Assessment and Plan:  1.  Dizziness, N.  Initially I thought it may be related to viral infection with preceding diarrhea  He has continued to have and appetite good In hosp, no report of arrhythmia. He was symptomatic during orthostatics here in office with no signif change in BP or P  This argues against an arrhythmia also QUestion if patinet had neurologic event  Would sched for MRI of brain.   I would also refer to neuro Continue meds  Get assistance as needed   2.  CAD  Patient has signif CAD as noted above  But sympotms continue without definite ischemia. Plan as abov.e     3.  HTN  Good today.  No Changes for now.     4. HL  Continue statin

## 2014-07-04 NOTE — Telephone Encounter (Signed)
New message     Yesterday pt was feeling like he was going to pass out and he called the doctor on call.  The PA on call told pt to call the office today and be seen today.

## 2014-07-04 NOTE — Telephone Encounter (Signed)
Spoke with patient's wife. She reports he was at hospital Sat for same symptoms.  He is SOB with exertion and had another episode of sweating and feeling like he was going to pass out. Spoke with cardiology PA yesterday and was informed to call office today for appointment.  Scheduled patient.  No CP, no syncope. No further episodes since yesterday when he called.

## 2014-07-04 NOTE — Patient Instructions (Signed)
Your physician recommends that you continue on your current medications as directed. Please refer to the Current Medication list given to you today. You have been referred to neurology Dr. Carles Collet.  Dr. Doristine Devoid office will call you to schedule an appointment. Dr. Harrington Challenger recommends you have an MRI of your brain for evaluation of your dizziness and unsteadiness.

## 2014-07-05 ENCOUNTER — Ambulatory Visit
Admission: RE | Admit: 2014-07-05 | Discharge: 2014-07-05 | Disposition: A | Discharge status: Home / Self Care | Payer: Medicare Other | Source: Ambulatory Visit | Attending: Internal Medicine | Admitting: Internal Medicine

## 2014-07-05 DIAGNOSIS — R42 Dizziness and giddiness: Secondary | ICD-10-CM | POA: Diagnosis not present

## 2014-07-05 DIAGNOSIS — Z85820 Personal history of malignant melanoma of skin: Secondary | ICD-10-CM | POA: Diagnosis not present

## 2014-07-05 DIAGNOSIS — R2681 Unsteadiness on feet: Secondary | ICD-10-CM | POA: Diagnosis not present

## 2014-07-05 DIAGNOSIS — H9190 Unspecified hearing loss, unspecified ear: Secondary | ICD-10-CM | POA: Diagnosis not present

## 2014-07-05 MED ORDER — GADOBENATE DIMEGLUMINE 529 MG/ML IV SOLN
20.0000 mL | Freq: Once | INTRAVENOUS | Status: AC | PRN
  Administered 2014-07-05: 20 mL via INTRAVENOUS

## 2014-07-07 ENCOUNTER — Telehealth: Payer: Self-pay | Admitting: Internal Medicine

## 2014-07-07 NOTE — Telephone Encounter (Signed)
Pt made aware of brain MRI results by phone. He has been feeling better since Monday and thinks it could have been a virus because others around him have had similar symptoms and asked if he still needs to see neurology. Encourage patient to keep the appointment with Neuro.

## 2014-07-07 NOTE — Telephone Encounter (Signed)
New message      Want test results-----pt went to [facility] on Monday or tuesday

## 2014-07-11 ENCOUNTER — Telehealth: Payer: Self-pay | Admitting: Neurology

## 2014-07-11 NOTE — Telephone Encounter (Signed)
Pt cancelled 07/15/14 appt w/ Dr. Tomi Likens. Referring provider's office notified via EP[facility] referral / Sherri S.

## 2014-07-14 ENCOUNTER — Ambulatory Visit
Admission: RE | Admit: 2014-07-14 | Discharge: 2014-07-14 | Disposition: A | Discharge status: Home / Self Care | Payer: Medicare Other | Source: Ambulatory Visit | Attending: Orthopedic Surgery | Admitting: Orthopedic Surgery

## 2014-07-14 ENCOUNTER — Other Ambulatory Visit: Payer: Self-pay | Admitting: Orthopedic Surgery

## 2014-07-14 DIAGNOSIS — R52 Pain, unspecified: Secondary | ICD-10-CM

## 2014-07-14 DIAGNOSIS — M25531 Pain in right wrist: Secondary | ICD-10-CM | POA: Diagnosis not present

## 2014-07-14 MED ORDER — IOHEXOL 180 MG/ML  SOLN
3.0000 mL | Freq: Once | INTRAMUSCULAR | Status: AC | PRN
  Administered 2014-07-14: 3 mL via INTRA_ARTICULAR

## 2014-07-15 ENCOUNTER — Ambulatory Visit: Payer: Medicare Other | Admitting: Neurology

## 2014-07-15 ENCOUNTER — Other Ambulatory Visit: Payer: Medicare Other

## 2014-07-25 ENCOUNTER — Other Ambulatory Visit: Payer: Self-pay

## 2014-07-25 MED ORDER — FUROSEMIDE 20 MG PO TABS: 40.0000 mg | ORAL_TABLET | Freq: Every day | ORAL | Status: DC

## 2014-07-25 MED ORDER — LOSARTAN POTASSIUM 50 MG PO TABS: 50.0000 mg | ORAL_TABLET | Freq: Two times a day (BID) | ORAL | Status: DC

## 2014-08-01 ENCOUNTER — Telehealth: Payer: Self-pay | Admitting: Internal Medicine

## 2014-08-01 NOTE — Telephone Encounter (Signed)
F/U  Patient returning call please contact at [phone].

## 2014-08-01 NOTE — Telephone Encounter (Signed)
New message     Wife says her husband's bp is high,.  Yesterday it was 209/88 pulse 49.  Today it is 179/73 pulse 54.  Please advise

## 2014-08-01 NOTE — Telephone Encounter (Signed)
Patient explained blood pressure elevated in mornings, before taking any medications. He states that it goes up and down, but usually lower by evening. Yesterday morning 209/88, 170/78 - HR 49. Yesterday evening 142/80. This morning 179/73 - HR 54. Reports no symptoms. Informed patient would have Dr. Harrington Challenger review and we would call him back once she has reviewed. Patient is agreeable to plan.

## 2014-08-01 NOTE — Telephone Encounter (Signed)
Patient someone called him from office but he could not hear them. Informed patient that I am not sure who called him, but hopefully they will call back. He was ok with this.

## 2014-08-02 ENCOUNTER — Ambulatory Visit (INDEPENDENT_AMBULATORY_CARE_PROVIDER_SITE_OTHER): Payer: Medicare Other | Admitting: Physician Assistant

## 2014-08-02 ENCOUNTER — Encounter: Payer: Self-pay | Admitting: Physician Assistant

## 2014-08-02 VITALS — BP 150/80 | HR 50 | Ht 69.0 in | Wt 268.0 lb

## 2014-08-02 DIAGNOSIS — I200 Unstable angina: Secondary | ICD-10-CM

## 2014-08-02 DIAGNOSIS — I10 Essential (primary) hypertension: Secondary | ICD-10-CM | POA: Diagnosis not present

## 2014-08-02 DIAGNOSIS — Z720 Tobacco use: Secondary | ICD-10-CM | POA: Diagnosis not present

## 2014-08-02 DIAGNOSIS — I2510 Atherosclerotic heart disease of native coronary artery without angina pectoris: Secondary | ICD-10-CM

## 2014-08-02 DIAGNOSIS — E785 Hyperlipidemia, unspecified: Secondary | ICD-10-CM | POA: Diagnosis not present

## 2014-08-02 MED ORDER — LOSARTAN POTASSIUM 100 MG PO TABS: 100.0000 mg | ORAL_TABLET | Freq: Every evening | ORAL | Status: DC

## 2014-08-02 MED ORDER — ISOSORBIDE MONONITRATE ER 60 MG PO TB24: 60.0000 mg | ORAL_TABLET | ORAL | Status: DC

## 2014-08-02 NOTE — Telephone Encounter (Signed)
Spoke with pt and his wife. Pt's BP has been high since Saturday. His BP today prior taking his medication was 203/91 and 190/75 Hr 49 beats/ minute . Pt took his AM  medication at 9:00 AM today Losartan 50 mg Imdur 30 mg also Furosemide 20 mg. Pt's BP now is 179/66 HR 49 beats/ minute. Pt states that his BP was high all day yesterday also and went down later in the evening to a normal level. Pt states does not feels well. He would like to know what to do. Pt has an appointment with Richardson Dopp for today at 1:30 PM.  I a tempted to call pt X 2, phone sound busy. Pt's daughter [name] aware of appointment.

## 2014-08-02 NOTE — Telephone Encounter (Signed)
Follow up      Family members have called several times regarding patient's bp.  Michalene asked to have message sent to triage since she is working with a doctor and cannot call them back soon.  Please call regarding pts bp

## 2014-08-02 NOTE — Telephone Encounter (Signed)
Follow up      Talk to a nurse regarding patient's bp.  Wife says it is high and they are waiting for a nurse to call them back.

## 2014-08-02 NOTE — Progress Notes (Signed)
Cardiology Office Note   Date:  08/02/2014   ID:  Levi [name], DOB [birth year], MRN [id number]  PCP:  [name], MD  Cardiologist:  Dr. [name]     History of Present Illness: Levi [name] is a 80 y.o. male with a hx of CAD s/p prior CABG, chronic angina, HTN, HL.  Last cath in 2013 demonstrated distal LM and prox LAD disease and 3/4 bypass grafts occluded.  Medical Rx was attempted first.  He continued to have anginal symptoms and ultimately underwent PCI with DES x 2 to the proximal and mid LAD.  Myoview in April of this year was neg for ischemia.    Admitted in 06/2014 with near syncope in the setting of nausea/vomiting/diarrhea.  OP echo after DC demonstrated normal Echo.    Last seen by Dr. [name] 07/04/14.  Notes indicate the patient's BP has been labile.  Amlodipine had been DCd in past due to dizziness (started in 3/15 and DC'd by appt in 5/15).  Given symptoms, Dr. Harrington Challenger obtained an MRI.  This demonstrated no acute CVA.  He was referred to Neurology.  However, the patient has cancelled this appointment.    He called in today with high BPs and was added no to my schedule for evaluation.  He is here with his wife and son.  His son is a Airline pilot.  He has checked the patient's BP with a manual cuff and confirmed the readings.  His BP has been higher over the past week.  He brings in a list of his readings.  It has been as high as 211/80.  He tells me that he feels weak when his BP is high.  He denies significant HA.  He denies facial droop, unilateral weakness.  He denies chest pain. He does note DOE.  He is NYHA 2b-3.  This is chronic without change.  He denies orthopnea.  He sleeps in a recliner due to post nasal drip.  He denies PND or significant edema.  Denies syncope.    Of note, he admits to a diet high in salt.  He eats out at places like [employer] and [employer].  He adds salt to his food and admits to eating crackers, etc.  He drinks coffee and soft drinks  throughout the day.  He quit smoking years ago.  But, he still chews smokeless tobacco.     Studies:   - LHC (12/13):  Dist LM 60-70%, ostial LAD 40-50%, D1 occluded, prox LAD 80% then aneurysmal segment then 80%, ostial septal perforator 99%, prox CFX 50%, OM branch serial 60% lesions, prox RCA 30%, PL Br 50%; LIMA-LAD patent, SVG-LAD occluded, SVG-OM occluded, SVG-PDA occluded >> Med Rx >> continued angina >>  PCI (2/14):  2.25 x 32 mm Promus DES and 2.5 x 20 mm Promus DES to prox and mid LAD  - Echo (10/15):  Mild LVH, EF 55-60%, no RWMA, mod LAE, mild TR, PASP 42 mmHg  - Nuclear (4/15):  Low risk stress nuclear study with a fixed defect in the apical anterior, inferior walls and in the true apex consistent with distal LAD scar. No ischemia. EF 56%   Recent Labs: 11/15/2013: TSH 4.63 06/01/2014: LDL (calc) 58 07/01/2014: ALT 12; BUN 16; Creatinine 1.05; Hemoglobin 13.7; Potassium 4.3; Sodium 143    Recent Radiology: Mr Kizzie Fantasia Contrast   07/05/2014    IMPRESSION: 1.  No acute or metastatic intracranial abnormality. 2. Mild for age signal changes most  suggestive of chronic small vessel disease.   Electronically Signed   By: Lars Pinks M.D.   On: 07/05/2014 11:43    Wt Readings from Last 3 Encounters:  08/02/14 268 lb (121.564 kg)  07/04/14 265 lb 12.8 oz (120.566 kg)  07/02/14 264 lb 14.4 oz (120.158 kg)     Past Medical History  Diagnosis Date  . HTN (hypertension)   . HLD (hyperlipidemia)   . Edema   . Benign neoplasm of colon   . Dyspnea on exertion   . Prostate cancer   . Osteopenia   . Emphysema   . Bronchitis   . Obesity   . Malaise and fatigue   . CAD (coronary artery disease) Dec 2011    s/p CABG per Dr. Roxy Manns; had normal EF; All SVGs occluded per follow up cath with only LIMA to LAD patent; s/p PCI of the proximal and mid LAD February 2014 per Dr. Burt Knack    Current Outpatient Prescriptions  Medication Sig Dispense Refill  . ALPRAZolam (XANAX) 0.25 MG tablet Take  0.25 mg by mouth 2 (two) times daily as needed for anxiety.    Marland Kitchen aspirin 81 MG tablet Take 81 mg by mouth at bedtime.    . clopidogrel (PLAVIX) 75 MG tablet Take 1 tablet by mouth  daily    . furosemide (LASIX) 20 MG tablet Take 2 tablets (40 mg total) by mouth daily. Take 40 mg once a week and 20 mg on all other days. 90 tablet 2  . isosorbide mononitrate (IMDUR) 30 MG 24 hr tablet Take 30 mg by mouth daily.    Marland Kitchen losartan (COZAAR) 50 MG tablet Take 1 tablet (50 mg total) by mouth 2 (two) times daily. 180 tablet 2  . nitroGLYCERIN (NITROSTAT) 0.4 MG SL tablet Place 0.4 mg under the tongue every 5 (five) minutes x 3 doses as needed for chest pain.    . pravastatin (PRAVACHOL) 20 MG tablet Take 20 mg by mouth daily.     No current facility-administered medications for this visit.     Allergies:   Review of patient's allergies indicates no known allergies.   Social History:  The patient  reports that he has quit smoking. He does not have any smokeless tobacco history on file. He reports that he does not drink alcohol or use illicit drugs.   Family History:  The patient's family history includes Heart attack in his father; Heart disease in his brother; Kidney cancer in his father; Prostate cancer in his brother; Skin cancer in his brother.    ROS:  Please see the history of present illness.   He has had some nausea.   All other systems reviewed and negative.    PHYSICAL EXAM: VS:  BP 150/80 mmHg  Pulse 50  Ht 5\' 9"  (1.753 m)  Wt 268 lb (121.564 kg)  BMI 39.56 kg/m2 Well nourished, well developed, in no acute distress HEENT: normal Neck: no JVD Cardiac:  normal S1, S2;  RRR; no murmur   Lungs:   clear to auscultation bilaterally, no wheezing, rhonchi or rales Abd: soft, nontender, no hepatomegaly Ext: trace bilateral LE edema Skin: warm and dry Neuro:  CNs 2-12 intact, no focal abnormalities noted  EKG:  Sinus brady, HR 50, LAD, RBBB, 1st degree AVB, no change from prior tracing        ASSESSMENT AND PLAN:  1.  Essential hypertension:  BP is uncontrolled.  He admits to a diet high in salt.  He also  drinks a lot of caffeine and chews tobacco.  He also checks his BP several times a day ("once every hour or 2").  He was intolerant of Amlodipine in the past due to dizziness.     -  We had a long discussion regarding a low sodium diet and how this relates to treatment of HTN.  I have recommended that he reduce his salt and caffeine intake.    -  Increase Isosorbide to 60 mg QAM.    -  Change Losartan to 100 mg QPM.    -  Consider adding Hydralazine if BP remains elevated.    -  I have encouraged him to check his BP less frequently.  2.  Coronary artery disease:  No angina.  Recent nuclear study negative for ischemia.      -  Continue ASA, Plavix, statin, nitrates.    -  He is not on beta blocker due to bradycardia. 3.  HLD (hyperlipidemia):  Continue statin.  4.  Smokeless tobacco use:  I have encouraged him to stop this.   Disposition:   FU with me in 2 weeks.    Signed, Versie Starks, MHS 08/02/2014 2:43 PM    [facility] [place], [place], [place]  [address] Phone: [phone]; Fax: [phone]

## 2014-08-02 NOTE — Patient Instructions (Addendum)
Your physician has recommended you make the following change in your medication:  1. INCREASE IMDUR TO 60 MG IN THE MORNING 2. CHANGE LOSARTAN TO 100 MG IN THE EVENING STARTING 08/03/14  Your physician recommends that you schedule a follow-up appointment on 08/15/14 @ 12:10 WITH Serenity Fortner, PAC    Low-Sodium Eating Plan Sodium raises blood pressure and causes water to be held in the body. Getting less sodium from food will help lower your blood pressure, reduce any swelling, and protect your heart, liver, and kidneys. We get sodium by adding salt (sodium chloride) to food. Most of our sodium comes from canned, boxed, and frozen foods. Restaurant foods, fast foods, and pizza are also very high in sodium. Even if you take medicine to lower your blood pressure or to reduce fluid in your body, getting less sodium from your food is important. WHAT IS MY PLAN? Most people should limit their sodium intake to 2,300 mg a day. Your health care provider recommends that you limit your sodium intake to 4000 mg (4 grams) a day.  WHAT DO I NEED TO KNOW ABOUT THIS EATING PLAN? For the low-sodium eating plan, you will follow these general guidelines:  Choose foods with a % Daily Value for sodium of less than 5% (as listed on the food label).   Use salt-free seasonings or herbs instead of table salt or sea salt.   Check with your health care provider or pharmacist before using salt substitutes.   Eat fresh foods.  Eat more vegetables and fruits.  Limit canned vegetables. If you do use them, rinse them well to decrease the sodium.   Limit cheese to 1 oz (28 g) per day.   Eat lower-sodium products, often labeled as "lower sodium" or "no salt added."  Avoid foods that contain monosodium glutamate (MSG). MSG is sometimes added to [place] food and some canned foods.  Check food labels (Nutrition Facts labels) on foods to learn how much sodium is in one serving.  Eat more home-cooked food and less  restaurant, buffet, and fast food.  When eating at a restaurant, ask that your food be prepared with less salt or none, if possible.  HOW DO I READ FOOD LABELS FOR SODIUM INFORMATION? The Nutrition Facts label lists the amount of sodium in one serving of the food. If you eat more than one serving, you must multiply the listed amount of sodium by the number of servings. Food labels may also identify foods as:  Sodium free--Less than 5 mg in a serving.  Very low sodium--35 mg or less in a serving.  Low sodium--140 mg or less in a serving.  Light in sodium--50% less sodium in a serving. For example, if a food that usually has 300 mg of sodium is changed to become light in sodium, it will have 150 mg of sodium.  Reduced sodium--25% less sodium in a serving. For example, if a food that usually has 400 mg of sodium is changed to reduced sodium, it will have 300 mg of sodium. WHAT FOODS CAN I EAT? Grains Low-sodium cereals, including oats, puffed wheat and rice, and shredded wheat cereals. Low-sodium crackers. Unsalted rice and pasta. Lower-sodium bread.  Vegetables Frozen or fresh vegetables. Low-sodium or reduced-sodium canned vegetables. Low-sodium or reduced-sodium tomato sauce and paste. Low-sodium or reduced-sodium tomato and vegetable juices.  Fruits Fresh, frozen, and canned fruit. Fruit juice.  Meat and Other Protein Products Low-sodium canned tuna and salmon. Fresh or frozen meat, poultry, seafood, and fish.  Lamb. Unsalted nuts. Dried beans, peas, and lentils without added salt. Unsalted canned beans. Homemade soups without salt. Eggs.  Dairy Milk. Soy milk. Ricotta cheese. Low-sodium or reduced-sodium cheeses. Yogurt.  Condiments Fresh and dried herbs and spices. Salt-free seasonings. Onion and garlic powders. Low-sodium varieties of mustard and ketchup. Lemon juice.  Fats and Oils Reduced-sodium salad dressings. Unsalted butter.  Other Unsalted popcorn and pretzels.    The items listed above may not be a complete list of recommended foods or beverages. Contact your dietitian for more options. WHAT FOODS ARE NOT RECOMMENDED? Grains Instant hot cereals. Bread stuffing, pancake, and biscuit mixes. Croutons. Seasoned rice or pasta mixes. Noodle soup cups. Boxed or frozen macaroni and cheese. Self-rising flour. Regular salted crackers. Vegetables Regular canned vegetables. Regular canned tomato sauce and paste. Regular tomato and vegetable juices. Frozen vegetables in sauces. Salted french fries. Olives. Angie Fava. Relishes. Sauerkraut. Salsa. Meat and Other Protein Products Salted, canned, smoked, spiced, or pickled meats, seafood, or fish. Bacon, ham, sausage, hot dogs, corned beef, chipped beef, and packaged luncheon meats. Salt pork. Jerky. Pickled herring. Anchovies, regular canned tuna, and sardines. Salted nuts. Dairy Processed cheese and cheese spreads. Cheese curds. Blue cheese and cottage cheese. Buttermilk.  Condiments Onion and garlic salt, seasoned salt, table salt, and sea salt. Canned and packaged gravies. Worcestershire sauce. Tartar sauce. Barbecue sauce. Teriyaki sauce. Soy sauce, including reduced sodium. Steak sauce. Fish sauce. Oyster sauce. Cocktail sauce. Horseradish. Regular ketchup and mustard. Meat flavorings and tenderizers. Bouillon cubes. Hot sauce. Tabasco sauce. Marinades. Taco seasonings. Relishes. Fats and Oils Regular salad dressings. Salted butter. Margarine. Ghee. Bacon fat.  Other Potato and tortilla chips. Corn chips and puffs. Salted popcorn and pretzels. Canned or dried soups. Pizza. Frozen entrees and pot pies.  The items listed above may not be a complete list of foods and beverages to avoid. Contact your dietitian for more information. Document Released: 02/08/2002 Document Revised: 08/24/2013 Document Reviewed: 06/23/2013 [facility] Patient Information 2015 [place], [place]. This information is not intended to replace  advice given to you by your health care provider. Make sure you discuss any questions you have with your health care provider.     DASH Eating Plan DASH stands for "Dietary Approaches to Stop Hypertension." The DASH eating plan is a healthy eating plan that has been shown to reduce high blood pressure (hypertension). Additional health benefits may include reducing the risk of type 2 diabetes mellitus, heart disease, and stroke. The DASH eating plan may also help with weight loss. WHAT DO I NEED TO KNOW ABOUT THE DASH EATING PLAN? For the DASH eating plan, you will follow these general guidelines:  Choose foods with a percent daily value for sodium of less than 5% (as listed on the food label).  Use salt-free seasonings or herbs instead of table salt or sea salt.  Check with your health care provider or pharmacist before using salt substitutes.  Eat lower-sodium products, often labeled as "lower sodium" or "no salt added."  Eat fresh foods.  Eat more vegetables, fruits, and low-fat dairy products.  Choose whole grains. Look for the word "whole" as the first word in the ingredient list.  Choose fish and skinless chicken or [place] more often than red meat. Limit fish, poultry, and meat to 6 oz (170 g) each day.  Limit sweets, desserts, sugars, and sugary drinks.  Choose heart-healthy fats.  Limit cheese to 1 oz (28 g) per day.  Eat more home-cooked food and less restaurant, buffet, and fast  food.  Limit fried foods.  Cook foods using methods other than frying.  Limit canned vegetables. If you do use them, rinse them well to decrease the sodium.  When eating at a restaurant, ask that your food be prepared with less salt, or no salt if possible. WHAT FOODS CAN I EAT? Seek help from a dietitian for individual calorie needs. Grains Whole grain or whole wheat bread. Brown rice. Whole grain or whole wheat pasta. Quinoa, bulgur, and whole grain cereals. Low-sodium cereals. Corn or  whole wheat flour tortillas. Whole grain cornbread. Whole grain crackers. Low-sodium crackers. Vegetables Fresh or frozen vegetables (raw, steamed, roasted, or grilled). Low-sodium or reduced-sodium tomato and vegetable juices. Low-sodium or reduced-sodium tomato sauce and paste. Low-sodium or reduced-sodium canned vegetables.  Fruits All fresh, canned (in natural juice), or frozen fruits. Meat and Other Protein Products Ground beef (85% or leaner), grass-fed beef, or beef trimmed of fat. Skinless chicken or [place]. Ground chicken or [place]. Pork trimmed of fat. All fish and seafood. Eggs. Dried beans, peas, or lentils. Unsalted nuts and seeds. Unsalted canned beans. Dairy Low-fat dairy products, such as skim or 1% milk, 2% or reduced-fat cheeses, low-fat ricotta or cottage cheese, or plain low-fat yogurt. Low-sodium or reduced-sodium cheeses. Fats and Oils Tub margarines without trans fats. Light or reduced-fat mayonnaise and salad dressings (reduced sodium). Avocado. Safflower, olive, or canola oils. Natural peanut or almond butter. Other Unsalted popcorn and pretzels. The items listed above may not be a complete list of recommended foods or beverages. Contact your dietitian for more options. WHAT FOODS ARE NOT RECOMMENDED? Grains White bread. White pasta. White rice. Refined cornbread. Bagels and croissants. Crackers that contain trans fat. Vegetables Creamed or fried vegetables. Vegetables in a cheese sauce. Regular canned vegetables. Regular canned tomato sauce and paste. Regular tomato and vegetable juices. Fruits Dried fruits. Canned fruit in light or heavy syrup. Fruit juice. Meat and Other Protein Products Fatty cuts of meat. Ribs, chicken wings, bacon, sausage, bologna, salami, chitterlings, fatback, hot dogs, bratwurst, and packaged luncheon meats. Salted nuts and seeds. Canned beans with salt. Dairy Whole or 2% milk, cream, half-and-half, and cream cheese. Whole-fat or sweetened  yogurt. Full-fat cheeses or blue cheese. Nondairy creamers and whipped toppings. Processed cheese, cheese spreads, or cheese curds. Condiments Onion and garlic salt, seasoned salt, table salt, and sea salt. Canned and packaged gravies. Worcestershire sauce. Tartar sauce. Barbecue sauce. Teriyaki sauce. Soy sauce, including reduced sodium. Steak sauce. Fish sauce. Oyster sauce. Cocktail sauce. Horseradish. Ketchup and mustard. Meat flavorings and tenderizers. Bouillon cubes. Hot sauce. Tabasco sauce. Marinades. Taco seasonings. Relishes. Fats and Oils Butter, stick margarine, lard, shortening, ghee, and bacon fat. Coconut, palm kernel, or palm oils. Regular salad dressings. Other Pickles and olives. Salted popcorn and pretzels. The items listed above may not be a complete list of foods and beverages to avoid. Contact your dietitian for more information. WHERE CAN I FIND MORE INFORMATION? National Heart, [employer], and [employer] Institute: travelstabloid.com Document Released: 08/08/2011 Document Revised: 01/03/2014 Document Reviewed: 06/23/2013 [facility] Patient Information 2015 [place], [place]. This information is not intended to replace advice given to you by your health care provider. Make sure you discuss any questions you have with your health care provider.

## 2014-08-09 ENCOUNTER — Other Ambulatory Visit: Payer: Self-pay

## 2014-08-09 DIAGNOSIS — I2510 Atherosclerotic heart disease of native coronary artery without angina pectoris: Secondary | ICD-10-CM

## 2014-08-09 MED ORDER — ISOSORBIDE MONONITRATE ER 60 MG PO TB24: 60.0000 mg | ORAL_TABLET | ORAL | Status: DC

## 2014-08-09 MED ORDER — LOSARTAN POTASSIUM 100 MG PO TABS: 100.0000 mg | ORAL_TABLET | Freq: Every evening | ORAL | Status: DC

## 2014-08-10 DIAGNOSIS — D040 Carcinoma in situ of skin of lip: Secondary | ICD-10-CM | POA: Diagnosis not present

## 2014-08-10 DIAGNOSIS — C44329 Squamous cell carcinoma of skin of other parts of face: Secondary | ICD-10-CM | POA: Diagnosis not present

## 2014-08-11 ENCOUNTER — Encounter (HOSPITAL_COMMUNITY): Payer: Self-pay | Admitting: Cardiology

## 2014-08-15 ENCOUNTER — Encounter: Payer: Self-pay | Admitting: Physician Assistant

## 2014-08-15 ENCOUNTER — Ambulatory Visit (INDEPENDENT_AMBULATORY_CARE_PROVIDER_SITE_OTHER): Payer: Medicare Other | Admitting: Physician Assistant

## 2014-08-15 VITALS — BP 130/70 | HR 56 | Ht 69.0 in | Wt 268.0 lb

## 2014-08-15 DIAGNOSIS — I200 Unstable angina: Secondary | ICD-10-CM | POA: Diagnosis not present

## 2014-08-15 DIAGNOSIS — I2510 Atherosclerotic heart disease of native coronary artery without angina pectoris: Secondary | ICD-10-CM

## 2014-08-15 DIAGNOSIS — R0683 Snoring: Secondary | ICD-10-CM

## 2014-08-15 DIAGNOSIS — I4891 Unspecified atrial fibrillation: Secondary | ICD-10-CM

## 2014-08-15 DIAGNOSIS — I10 Essential (primary) hypertension: Secondary | ICD-10-CM | POA: Diagnosis not present

## 2014-08-15 DIAGNOSIS — E785 Hyperlipidemia, unspecified: Secondary | ICD-10-CM | POA: Diagnosis not present

## 2014-08-15 DIAGNOSIS — M2550 Pain in unspecified joint: Secondary | ICD-10-CM

## 2014-08-15 LAB — URIC ACID: URIC ACID, SERUM: 4.7 mg/dL (ref 4.0–7.8)

## 2014-08-15 NOTE — Progress Notes (Signed)
Cardiology Office Note   Date:  08/15/2014   ID:  Levi [name], DOB 1929-02-03, MRN [id number]  PCP:  Levi Inches, MD  Cardiologist:  Dr. [name]     History of Present Illness: Levi [name] is a 80 y.o. male with a hx of CAD s/p prior CABG, chronic angina, HTN, HL.  Last cath in 2013 demonstrated distal LM and prox LAD disease and 3/4 bypass grafts occluded.  Medical Rx was attempted first.  He continued to have anginal symptoms and ultimately underwent PCI with DES x 2 to the proximal and mid LAD.  Myoview in April of this year was neg for ischemia.    Admitted in 06/2014 with near syncope in the setting of nausea/vomiting/diarrhea.  OP echo after DC demonstrated normal EF.    Last seen by Dr. [name] 07/04/14.  Amlodipine had been DCd in past due to dizziness (started in 3/15 and DC'd by appt in 5/15). MRI demonstrated no acute CVA.  He was referred to Neurology.  However, the patient has cancelled this appointment.    He called in 12/1 with high BPs and was added no to my schedule for evaluation.  His BP was as high as 211/80.  Of note, he admitted to a diet high in salt.  I had him reduce his salt.  I increased his Isosorbide and changed his Losartan to in the evening.  He returns for FU.  He is here with his wife.  He is doing well.  He brings in a list of his BPs.  They range 94/57 to 178/75.  On avg, they are < 150/90.  He has tried to cut back on his salt.  He denies chest pain.  He continues to note DOE and remains NYHA 2b-3.  He sleeps in a recliner at times.  Denies PND.  He denies significant change in his LE edema.  He does have a hx of snoring.  He admits to daytime hypersomnolence.  He has never been tested for sleep apnea.    Studies:   - LHC (12/13):  Dist LM 60-70%, ostial LAD 40-50%, D1 occluded, prox LAD 80% then aneurysmal segment then 80%, ostial septal perforator 99%, prox CFX 50%, OM branch serial 60% lesions, prox RCA 30%, PL Br 50%; LIMA-LAD patent,  SVG-LAD occluded, SVG-OM occluded, SVG-PDA occluded >> Med Rx >> continued angina >>  PCI (2/14):  2.25 x 32 mm Promus DES and 2.5 x 20 mm Promus DES to prox and mid LAD  - Echo (10/15):  Mild LVH, EF 55-60%, no RWMA, mod LAE, mild TR, PASP 42 mmHg  - Nuclear (4/15):  Low risk stress nuclear study with a fixed defect in the apical anterior, inferior walls and in the true apex consistent with distal LAD scar. No ischemia. EF 56%   Recent Labs: 11/15/2013: TSH 4.63 06/01/2014: LDL (calc) 58 07/01/2014: ALT 12; BUN 16; Creatinine 1.05; Hemoglobin 13.7; Potassium 4.3; Sodium 143    Recent Radiology: Mr Levi [name] Contrast   07/05/2014    IMPRESSION: 1.  No acute or metastatic intracranial abnormality. 2. Mild for age signal changes most suggestive of chronic small vessel disease.   Electronically Signed   By: Lars Pinks M.D.   On: 07/05/2014 11:43    Wt Readings from Last 3 Encounters:  08/15/14 268 lb (121.564 kg)  08/02/14 268 lb (121.564 kg)  07/04/14 265 lb 12.8 oz (120.566 kg)     Past Medical History  Diagnosis  Date  . HTN (hypertension)   . HLD (hyperlipidemia)   . Edema   . Benign neoplasm of colon   . Dyspnea on exertion   . Prostate cancer   . Osteopenia   . Emphysema   . Bronchitis   . Obesity   . Malaise and fatigue   . CAD (coronary artery disease) Dec 2011    s/p CABG per Dr. Roxy Manns; had normal EF; All SVGs occluded per follow up cath with only LIMA to LAD patent; s/p PCI of the proximal and mid LAD February 2014 per Dr. Burt Knack    Current Outpatient Prescriptions  Medication Sig Dispense Refill  . ALPRAZolam (XANAX) 0.25 MG tablet Take 0.25 mg by mouth 2 (two) times daily as needed for anxiety.    Marland Kitchen aspirin 81 MG tablet Take 81 mg by mouth at bedtime.    . clopidogrel (PLAVIX) 75 MG tablet Take 1 tablet by mouth  daily    . furosemide (LASIX) 20 MG tablet Take 2 tablets (40 mg total) by mouth daily. Take 40 mg once a week and 20 mg on all other days. 90 tablet 2  .  isosorbide mononitrate (IMDUR) 60 MG 24 hr tablet Take 60 mg by mouth daily.    Marland Kitchen losartan (COZAAR) 100 MG tablet Take 100 mg by mouth daily.    . nitroGLYCERIN (NITROSTAT) 0.4 MG SL tablet Place 0.4 mg under the tongue every 5 (five) minutes x 3 doses as needed for chest pain.    . pravastatin (PRAVACHOL) 20 MG tablet Take 20 mg by mouth daily.     No current facility-administered medications for this visit.     Allergies:   Review of patient's allergies indicates no known allergies.   Social History:  The patient  reports that he has quit smoking. He does not have any smokeless tobacco history on file. He reports that he does not drink alcohol or use illicit drugs.   Family History:  The patient's family history includes Heart attack in his father; Heart disease in his brother; Kidney cancer in his father; Prostate cancer in his brother; Skin cancer in his brother.    ROS:  Please see the history of present illness.   He complains of L wrist pain that started suddenly.  He denies trauma.  All other systems reviewed and negative.    PHYSICAL EXAM: VS:  BP 130/70 mmHg  Pulse 56  Ht 5\' 9"  (1.753 m)  Wt 268 lb (121.564 kg)  BMI 39.56 kg/m2 Well nourished, well developed, in no acute distress HEENT: normal Neck:  I cannot appreciate JVD Cardiac:  Distant S1, S2;  RRR; no murmur   Lungs:   Decreased breath sounds bilaterally, no wheezing, rhonchi or rales Abd: protuberent, nontender, no hepatomegaly Ext: trace bilateral LE edema MSK:  L wrist with tenderness to palpation over the area near the ulnar stylus  Skin: warm and dry Neuro:  CNs 2-12 intact, no focal abnormalities noted  EKG:  Sinus brady, HR 56, LAD, RBBB, no change from prior tracing   ASSESSMENT AND PLAN:   1.  Essential hypertension:  BP looks better.  He never made the changes I prescribed the last time.  He had cut back on his salt and is eating out less.  I will keep his medications at their current dose.      -   He can take extra Imdur 30 mg QD prn for increased BP > 170/100. 2.  Coronary artery disease:  No angina.  Recent nuclear study negative for ischemia.      -  Continue ASA, Plavix, statin, nitrates.    -  He is not on beta blocker due to bradycardia. 3.  HLD (hyperlipidemia):  Continue statin.  4.  Snoring:  He has a lot of symptoms of OSA.  STOP-BANG score 8.      -  Arrange split-night sleep study. 5.  L Wrist Pain:  This would be an unusual place for gout.  But, he has no real explanation for the pain.    -  Check Uric Acid level.  If elevated, start Colchicine.    -  Schedule FU with PCP.   Disposition:   FU with Dr. [name] or me in 3 mos.    Signed, Versie Starks, MHS 08/15/2014 12:35 PM    [facility] [place], [place], [place]  [address] Phone: [phone]; Fax: [phone]

## 2014-08-15 NOTE — Patient Instructions (Signed)
CONTINUE CURRENT MEDICATIONS  MONITOR BLOOD PRESSURE AND IF YOUR BP IS RUNNING ABOVE 170/100 THEN OK TO TAKE EXTRA 30 MG IMDUR  SPLIT NIGHT SLEEP STUDY; DX SNORING  LAB WORK TODAY; URIC ACID  Your physician recommends that you schedule a follow-up appointment in: 3 MONTHS WITH DR. ROSS OR SCOTT WEAVER, PAC SAME DAY DR. Harrington Challenger IS IN THE OFFICE

## 2014-08-16 ENCOUNTER — Telehealth: Payer: Self-pay | Admitting: *Deleted

## 2014-08-16 ENCOUNTER — Ambulatory Visit: Payer: Medicare Other | Admitting: Physician Assistant

## 2014-08-16 NOTE — Telephone Encounter (Signed)
pt notified about normal lab results and to f/u w/PCP for further evaluation. Pt verbalized understanding.

## 2014-08-16 NOTE — Telephone Encounter (Signed)
lmptcb for lab results 

## 2014-10-10 ENCOUNTER — Other Ambulatory Visit: Payer: Self-pay | Admitting: Dermatology

## 2014-10-10 ENCOUNTER — Telehealth: Payer: Self-pay | Admitting: Internal Medicine

## 2014-10-10 DIAGNOSIS — C4441 Basal cell carcinoma of skin of scalp and neck: Secondary | ICD-10-CM | POA: Diagnosis not present

## 2014-10-10 DIAGNOSIS — D485 Neoplasm of uncertain behavior of skin: Secondary | ICD-10-CM | POA: Diagnosis not present

## 2014-10-10 DIAGNOSIS — L570 Actinic keratosis: Secondary | ICD-10-CM | POA: Diagnosis not present

## 2014-10-10 NOTE — Telephone Encounter (Signed)
New Message        Pt's wife calling stating that pt saw a dermatologist this morning and that doctor recommended that pt call Dr. Harrington Challenger and ask if his aspirin dosage needs to be decreased. Pt has gotten a black eye that just appeared without any trauma to the area and the dermatologist recommends talking to Dr. Harrington Challenger. Please call back and advise.

## 2014-10-21 NOTE — Telephone Encounter (Signed)
plavix dc'd from medication list.

## 2014-10-21 NOTE — Telephone Encounter (Signed)
I spoke with patient. He described having a black eye. His sclera was never bloody, his vision remained normal. The discoloration under his eye has cleared up.  He reports he is only taking baby aspirin. Not plavix and asa as listed in meds.  Has appointment with Dr. Harrington Challenger 11/17/14.

## 2014-10-21 NOTE — Telephone Encounter (Signed)
Just keep on Aspirin  I would not add plavix back if already off

## 2014-11-03 ENCOUNTER — Encounter (HOSPITAL_BASED_OUTPATIENT_CLINIC_OR_DEPARTMENT_OTHER): Payer: Medicare Other

## 2014-11-03 DIAGNOSIS — C4441 Basal cell carcinoma of skin of scalp and neck: Secondary | ICD-10-CM | POA: Diagnosis not present

## 2014-11-03 DIAGNOSIS — Z85828 Personal history of other malignant neoplasm of skin: Secondary | ICD-10-CM | POA: Diagnosis not present

## 2014-11-04 DIAGNOSIS — S63502A Unspecified sprain of left wrist, initial encounter: Secondary | ICD-10-CM | POA: Diagnosis not present

## 2014-11-04 DIAGNOSIS — M65332 Trigger finger, left middle finger: Secondary | ICD-10-CM | POA: Diagnosis not present

## 2014-11-17 ENCOUNTER — Ambulatory Visit (INDEPENDENT_AMBULATORY_CARE_PROVIDER_SITE_OTHER): Payer: Medicare Other | Admitting: Internal Medicine

## 2014-11-17 ENCOUNTER — Encounter: Payer: Self-pay | Admitting: Internal Medicine

## 2014-11-17 VITALS — BP 154/63 | HR 59 | Ht 69.0 in | Wt 267.0 lb

## 2014-11-17 DIAGNOSIS — R0602 Shortness of breath: Secondary | ICD-10-CM

## 2014-11-17 DIAGNOSIS — I4891 Unspecified atrial fibrillation: Secondary | ICD-10-CM | POA: Diagnosis not present

## 2014-11-17 LAB — BASIC METABOLIC PANEL
BUN: 20 mg/dL (ref 6–23)
CHLORIDE: 105 meq/L (ref 96–112)
CO2: 32 meq/L (ref 19–32)
Calcium: 9.2 mg/dL (ref 8.4–10.5)
Creatinine, Ser: 1.1 mg/dL (ref 0.40–1.50)
GFR: 67.83 mL/min (ref >60.00)
Glucose, Bld: 90 mg/dL (ref 70–99)
Potassium: 4.5 mEq/L (ref 3.5–5.1)
SODIUM: 139 meq/L (ref 135–145)

## 2014-11-17 LAB — BRAIN NATRIURETIC PEPTIDE: PRO B NATRI PEPTIDE: 141 pg/mL — AB (ref 0.0–100.0)

## 2014-11-17 LAB — CBC
HEMATOCRIT: 43.9 % (ref 39.0–52.0)
Hemoglobin: 14.7 g/dL (ref 13.0–17.0)
MCHC: 33.6 g/dL (ref 30.0–36.0)
MCV: 90.4 fl (ref 78.0–100.0)
PLATELETS: 244 10*3/uL (ref 150.0–400.0)
RBC: 4.86 Mil/uL (ref 4.22–5.81)
RDW: 13.3 % (ref 11.5–15.5)
WBC: 10.3 10*3/uL (ref 4.0–10.5)

## 2014-11-17 LAB — TSH: TSH: 2.52 u[IU]/mL (ref 0.35–4.50)

## 2014-11-17 NOTE — Patient Instructions (Signed)
Your physician recommends that you continue on your current medications as directed. Please refer to the Current Medication list given to you today. Your physician recommends that you return for lab work today. (cbc, bmet, tsh, bnp) Your physician has requested that you have a lexiscan myoview. For further information please visit [email or web address]. Please follow instruction sheet, as given.

## 2014-11-17 NOTE — Progress Notes (Signed)
Cardiology Office Note   Date:  11/17/2014   ID:  Levi [name], DOB 1929-05-25, MRN [id number]  PCP:  [name], MD  Cardiologist:   [name], MD   Chief Complaint  Patient presents with  . 3 MO F/U VISIT      History of Present Illness: Levi [name] is a 81 y.o. male with a history of HPI  coronary artery disease with prior CABG. All of his vein grafts are occluded and is last remaining graft is a LIMA to diagonal. He underwent PCI/DES to LAD in Feb 2014 He was seen by Ezzie Dural in clinic in March.I saw him at the end of march. He had a lexiscna myoview to eval for ischemia. THis showed small region of disttal anterior, distal inferior and apical scar LVEF normal. No ischemia.  He has been seen by Kathleen Argue  BP has been up and down  Last seen in December.  Off of amlodipine  Takes imdur extra as needed  Wife says he has been more SOB over the past year  Takes longer to do things  No CP  Wife thinks it is getting worse.  No PND  No CP    Patient says his BP is still up and down  120 to 170 /    STUDIES:   - LHC (12/13): Dist LM 60-70%, ostial LAD 40-50%, D1 occluded, prox LAD 80% then aneurysmal segment then 80%, ostial septal perforator 99%, prox CFX 50%, OM branch serial 60% lesions, prox RCA 30%, PL Br 50%; LIMA-LAD patent, SVG-LAD occluded, SVG-OM occluded, SVG-PDA occluded >> Med Rx >> continued angina >>  PCI (2/14): 2.25 x 32 mm Promus DES and 2.5 x 20 mm Promus DES to prox and mid LAD - Echo (10/15): Mild LVH, EF 55-60%, no RWMA, mod LAE, mild TR, PASP 42 mmHg - Nuclear (4/15): Low risk stress nuclear study with a fixed defect in the apical anterior, inferior walls and in the true apex consistent with distal LAD scar. No ischemia. EF 56%    Current Outpatient Prescriptions  Medication Sig Dispense Refill  . ALPRAZolam (XANAX) 0.25 MG tablet Take 0.25 mg by mouth 2 (two) times daily as needed for anxiety.    Marland Kitchen aspirin 81 MG tablet Take 81 mg by  mouth at bedtime.    . clopidogrel (PLAVIX) 75 MG tablet Take 75 mg by mouth daily.    . furosemide (LASIX) 20 MG tablet Take 2 tablets (40 mg total) by mouth daily. Take 40 mg once a week and 20 mg on all other days. 90 tablet 2  . isosorbide mononitrate (IMDUR) 60 MG 24 hr tablet Take 30 mg by mouth daily.    Marland Kitchen losartan (COZAAR) 100 MG tablet Take 50 mg by mouth 2 (two) times daily.    . nitroGLYCERIN (NITROSTAT) 0.4 MG SL tablet Place 0.4 mg under the tongue every 5 (five) minutes x 3 doses as needed for chest pain.    . pravastatin (PRAVACHOL) 20 MG tablet Take 20 mg by mouth daily.     No current facility-administered medications for this visit.    Allergies:   Review of patient's allergies indicates no known allergies.   Past Medical History  Diagnosis Date  . HTN (hypertension)   . HLD (hyperlipidemia)   . Edema   . Benign neoplasm of colon   . Dyspnea on exertion   . Prostate cancer   . Osteopenia   . Emphysema   . Bronchitis   .  Obesity   . Malaise and fatigue   . CAD (coronary artery disease) Dec 2011    s/p CABG per Dr. Roxy Manns; had normal EF; All SVGs occluded per follow up cath with only LIMA to LAD patent; s/p PCI of the proximal and mid LAD February 2014 per Dr. Burt Knack    Past Surgical History  Procedure Laterality Date  . Median sternotomy  08/23/10  . Coronary artery bypass graft  08/23/10  . Cervical disc surgery    . Cardiac catheterization  10/14/12  . Coronary stent placement  10/14/12    with stent to proximal and mid LAD  . Left heart catheterization with coronary/graft angiogram N/A 09/01/2012    Procedure: LEFT HEART CATHETERIZATION WITH [name];  Surgeon: [name], MD;  Location: [facility] CATH LAB;  Service: Cardiovascular;  Laterality: N/A;  . Percutaneous coronary stent intervention (pci-s) N/A 10/14/2012    Procedure: PERCUTANEOUS CORONARY STENT INTERVENTION (PCI-S);  Surgeon: [name], MD;  Location: [facility] CATH LAB;  Service:  Cardiovascular;  Laterality: N/A;     Social History:  The patient  reports that he has quit smoking. He does not have any smokeless tobacco history on file. He reports that he does not drink alcohol or use illicit drugs.   Family History:  The patient's family history includes Heart attack in his father; Heart disease in his brother; Kidney cancer in his father; Prostate cancer in his brother; Skin cancer in his brother.    ROS:  Please see the history of present illness. All other systems are reviewed and  Negative to the above problem except as noted.    PHYSICAL EXAM: VS:  BP 154/63 mmHg  Pulse 59  Ht 5\' 9"  (1.753 m)  Wt 267 lb (121.11 kg)  BMI 39.41 kg/m2  GEN: Morbidly obese 81 yo  in no acute distress HEENT: normal Neck: no JVD, carotid bruits, or masses Cardiac: RRR; no murmurs, rubs, or gallops,no edema  Respiratory:  clear to auscultation bilaterally, normal work of breathing GI: soft, nontender, nondistended, + BS  No hepatomegaly  MS: no deformity Moving all extremities   Skin: warm and dry, no rash Neuro:  Strength and sensation are intact Psych: euthymic mood, full affect   EKG:  EKG is not ordered today.   Lipid Panel    Component Value Date/Time   CHOL 113 06/01/2014 0930   TRIG 115.0 06/01/2014 0930   HDL 32.40* 06/01/2014 0930   CHOLHDL 3 06/01/2014 0930   VLDL 23.0 06/01/2014 0930   LDLCALC 58 06/01/2014 0930   LDLDIRECT 70.7 06/10/2013 1344      Wt Readings from Last 3 Encounters:  11/17/14 267 lb (121.11 kg)  08/15/14 268 lb (121.564 kg)  08/02/14 268 lb (121.564 kg)      ASSESSMENT AND PLAN: 1  Dysnea  Will check labs tday  If no signif abnormalities sched for lexiscan myoview to r/o ischemia in the LAD distribution.  2  CAD  As above    3  HTN  Labile  Watch salt  I would not push tighter as he has been below 100 before  4.  HL  Keep on meds  Encouraged him to stay active  Diff with DJD      Current medicines are reviewed at  length with the patient today.  The patient does not have concerns regarding medicines.  The following changes have been made: No change    Labs/ tests ordered today include:  CBC, BEMT, BNP,  TSH  Myoview  Orders Placed This Encounter  Procedures  . CBC  . TSH  . Basic Metabolic Panel (BMET)  . B Nat Peptide  . Myocardial Perfusion Imaging     Disposition:   FU with me in August    Signed, Quyen Cutsforth, MD  11/17/2014 12:14 PM    [facility] [place], [place], [place]  [address] Phone: [phone]; Fax: [phone]

## 2014-11-23 ENCOUNTER — Encounter: Payer: Self-pay | Admitting: Cardiovascular Disease

## 2014-11-23 ENCOUNTER — Encounter: Payer: Self-pay | Admitting: Cardiology

## 2014-11-28 ENCOUNTER — Ambulatory Visit (HOSPITAL_COMMUNITY): Payer: Medicare Other | Attending: Internal Medicine | Admitting: Radiology

## 2014-11-28 DIAGNOSIS — I4891 Unspecified atrial fibrillation: Secondary | ICD-10-CM | POA: Insufficient documentation

## 2014-11-28 DIAGNOSIS — R0602 Shortness of breath: Secondary | ICD-10-CM | POA: Diagnosis not present

## 2014-11-28 MED ORDER — TECHNETIUM TC 99M SESTAMIBI GENERIC - CARDIOLITE
33.0000 | Freq: Once | INTRAVENOUS | Status: AC | PRN
  Administered 2014-11-28: 33 via INTRAVENOUS

## 2014-11-28 MED ORDER — TECHNETIUM TC 99M SESTAMIBI GENERIC - CARDIOLITE
11.0000 | Freq: Once | INTRAVENOUS | Status: AC | PRN
  Administered 2014-11-28: 11 via INTRAVENOUS

## 2014-11-28 MED ORDER — REGADENOSON 0.4 MG/5ML IV SOLN
0.4000 mg | Freq: Once | INTRAVENOUS | Status: AC
  Administered 2014-11-28: 0.4 mg via INTRAVENOUS

## 2014-11-28 NOTE — Progress Notes (Signed)
[facility]UCLEAR [facility] [address] [place], [place] [address] [phone]    Cardiology Nuclear Med Study  ADRIEN SHANKAR is a 81 y.o. male     MRN : [id number]     DOB: [birth year]  Procedure Date: 11/28/2014  Nuclear Med Background Indication for Stress Test:  Evaluation for Ischemia and Follow up CAD History:  Emphysema, 2/15 MPI: EF: 56% AFIB,  Cardiac Risk Factors: Hypertension and Lipids  Symptoms:  DOE, Fatigue, Palpitations, SOB and Syncope   Nuclear Pre-Procedure Caffeine/Decaff Intake:  None NPO After: 6:30am   Lungs:  clear O2 Sat: 96% on room air. IV 0.9% NS with Angio Cath:  22g  IV Site: R Hand  IV Started by:  Matilde Haymaker, RN  Chest Size (in):  48 Cup Size: n/a  Height: 5\' 9"  (1.753 m)  Weight:  268 lb (121.564 kg)  BMI:  Body mass index is 39.56 kg/(m^2). Tech Comments:  n/a    Nuclear Med Study 1 or 2 day study: 1 day  Stress Test Type:  Lexiscan  Reading MD: n/a  Order Authorizing Provider:  Verlin Dike  Resting Radionuclide: Technetium 19m Sestamibi  Resting Radionuclide Dose: 11.0 mCi   Stress Radionuclide:  Technetium 75m Sestamibi  Stress Radionuclide Dose: 33.0 mCi           Stress Protocol Rest HR: 48 Stress HR: 59  Rest BP: 157/77 Stress BP: 152/66  Exercise Time (min): n/a METS: n/a   Predicted Max HR: 137 bpm % Max HR: 43.07 bpm Rate Pressure Product: 9263   Dose of Adenosine (mg):  n/a Dose of Lexiscan: 0.4 mg  Dose of Atropine (mg): n/a Dose of Dobutamine: n/a mcg/kg/min (at max HR)  Stress Test Technologist: Perrin Maltese, EMT-P  Nuclear Technologist:  Earl Many, CNMT     Rest Procedure:  Myocardial perfusion imaging was performed at rest 45 minutes following the intravenous administration of Technetium 42m Sestamibi. Rest ECG: Sinus bradycardia with first degree AV block, RBBB, LVH.  Stress Procedure:  The patient received IV Lexiscan 0.4 mg over 15-seconds.  Technetium 28m Sestamibi  injected at 30-seconds. This patient was sob with the Lexiscan injection. Quantitative spect images were obtained after a 45 minute delay. Stress ECG: No significant ST segment change suggestive of ischemia.  QPS Raw Data Images:  Acquisition technically good; normal left ventricular size. Stress Images:  There is decreased uptake in the inferior wall. Rest Images:  There is decreased uptake in the inferior wall. Subtraction (SDS):  No evidence of ischemia. Transient Ischemic Dilatation (Normal <1.22):  0.91 Lung/Heart Ratio (Normal <0.45):  0.35  Quantitative Gated Spect Images QGS EDV:  118 ml QGS ESV:  52 ml  Impression Exercise Capacity:  Lexiscan with no exercise. BP Response:  Normal blood pressure response. Clinical Symptoms:  There is dyspnea. ECG Impression:  No significant ST segment change suggestive of ischemia. Comparison with Prior Nuclear Study: No significant change from previous study  Overall Impression:  Low risk stress nuclear study with a moderate size, medium intensity, fixed inferior/apical defect consistent with prior infarct; no significant ischemia.  LV Ejection Fraction: 56%.  LV Wall Motion:  Inferior hypokinesis.  Kirk Ruths

## 2014-12-09 ENCOUNTER — Telehealth: Payer: Self-pay | Admitting: Internal Medicine

## 2014-12-09 DIAGNOSIS — M545 Low back pain: Secondary | ICD-10-CM | POA: Diagnosis not present

## 2014-12-09 NOTE — Telephone Encounter (Signed)
Pt made aware of results no questions at this time.

## 2014-12-09 NOTE — Telephone Encounter (Signed)
New Message  Pt calling to speak w/ Rn about stress test results/ Please call back and discuss.

## 2014-12-14 ENCOUNTER — Encounter: Payer: Self-pay | Admitting: Internal Medicine

## 2014-12-21 ENCOUNTER — Other Ambulatory Visit: Payer: Self-pay | Admitting: Internal Medicine

## 2014-12-22 ENCOUNTER — Other Ambulatory Visit: Payer: Self-pay | Admitting: Dermatology

## 2014-12-22 DIAGNOSIS — Z85828 Personal history of other malignant neoplasm of skin: Secondary | ICD-10-CM | POA: Diagnosis not present

## 2014-12-22 DIAGNOSIS — L821 Other seborrheic keratosis: Secondary | ICD-10-CM | POA: Diagnosis not present

## 2014-12-22 DIAGNOSIS — L905 Scar conditions and fibrosis of skin: Secondary | ICD-10-CM | POA: Diagnosis not present

## 2014-12-22 DIAGNOSIS — L570 Actinic keratosis: Secondary | ICD-10-CM | POA: Diagnosis not present

## 2014-12-22 DIAGNOSIS — D0439 Carcinoma in situ of skin of other parts of face: Secondary | ICD-10-CM | POA: Diagnosis not present

## 2014-12-22 DIAGNOSIS — D0412 Carcinoma in situ of skin of left eyelid, including canthus: Secondary | ICD-10-CM | POA: Diagnosis not present

## 2014-12-22 DIAGNOSIS — D485 Neoplasm of uncertain behavior of skin: Secondary | ICD-10-CM | POA: Diagnosis not present

## 2014-12-28 DIAGNOSIS — Z23 Encounter for immunization: Secondary | ICD-10-CM | POA: Diagnosis not present

## 2015-01-04 DIAGNOSIS — C44119 Basal cell carcinoma of skin of left eyelid, including canthus: Secondary | ICD-10-CM | POA: Diagnosis not present

## 2015-01-04 DIAGNOSIS — Z85828 Personal history of other malignant neoplasm of skin: Secondary | ICD-10-CM | POA: Diagnosis not present

## 2015-01-06 ENCOUNTER — Encounter: Payer: Self-pay | Admitting: Internal Medicine

## 2015-01-06 ENCOUNTER — Ambulatory Visit (INDEPENDENT_AMBULATORY_CARE_PROVIDER_SITE_OTHER): Payer: Medicare Other | Admitting: Internal Medicine

## 2015-01-06 ENCOUNTER — Telehealth: Payer: Self-pay | Admitting: Internal Medicine

## 2015-01-06 VITALS — BP 90/40 | HR 97 | Ht 70.0 in | Wt 264.2 lb

## 2015-01-06 DIAGNOSIS — I4892 Unspecified atrial flutter: Secondary | ICD-10-CM

## 2015-01-06 LAB — PROTIME-INR
INR: 1.1 ratio — AB (ref 0.8–1.0)
PROTHROMBIN TIME: 11.8 s (ref 9.6–13.1)

## 2015-01-06 LAB — BASIC METABOLIC PANEL
BUN: 21 mg/dL (ref 6–23)
CO2: 29 mEq/L (ref 19–32)
Calcium: 9.1 mg/dL (ref 8.4–10.5)
Chloride: 104 mEq/L (ref 96–112)
Creatinine, Ser: 1.38 mg/dL (ref 0.40–1.50)
GFR: 52.19 mL/min — AB (ref >60.00)
GLUCOSE: 145 mg/dL — AB (ref 70–99)
Potassium: 4 mEq/L (ref 3.5–5.1)
SODIUM: 138 meq/L (ref 135–145)

## 2015-01-06 LAB — TROPONIN I: TNIDX: 0.01 ug/l (ref 0.00–0.06)

## 2015-01-06 MED ORDER — ASPIRIN 81 MG PO TABS: 81.0000 mg | ORAL_TABLET | Freq: Every day | ORAL | Status: DC

## 2015-01-06 MED ORDER — RIVAROXABAN 20 MG PO TABS: 20.0000 mg | ORAL_TABLET | Freq: Every day | ORAL | Status: DC

## 2015-01-06 NOTE — Telephone Encounter (Signed)
New message      Pt c/o of Chest Pain: STAT if CP now or developed within 24 hours  1. Are you having CP right now? No---but feels like he is going to pass out when he walks around 2. Are you experiencing any other symptoms (ex. SOB, nausea, vomiting, sweating)? Pt always has sob 3. How long have you been experiencing CP? 2 days ago  4. Is your CP continuous or coming and going? Comes and goes  5. Have you taken Nitroglycerin?  Yes-----last night ?

## 2015-01-06 NOTE — Progress Notes (Addendum)
Cardiology Office Note   Date:  01/06/2015   ID:  Levi [name], DOB 07-Mar-1929, MRN [id number]  PCP:  Levi Inches, MD  Cardiologist:   [name], MD   No chief complaint on file.     History of Present Illness: Levi [name] is a 81 y.o. male with a history of CAD  He is s/p CABG. All of his vein grafts are occluded and is last remaining graft is a LIMA to diagonal. He underwent PCI/DES to LAD in Feb 2014 He was seen by Levi [name] in clinic in March.I saw him at the end of march. He had a lexiscna myoview to eval for ischemia. THis showed small region of disttal anterior, distal inferior and apical scar LVEF normal. No ischemia.  He has been seen by Levi [name] BP has been up and down Last seen in December. Off of amlodipine Takes imdur extra as needed  Myoview March 2016 was normal    Patient called in complaining of chest discomfort with exertion.  Eased with NTG  Complains of fatigue  Some diaphoresis.  Nausea  Dizziness No synope   Does have some chest pains  Worse with inspiration  In epigastric area.    STUDIES:  - LHC (12/13): Dist LM 60-70%, ostial LAD 40-50%, D1 occluded, prox LAD 80% then aneurysmal segment then 80%, ostial septal perforator 99%, prox CFX 50%, OM branch serial 60% lesions, prox RCA 30%, PL Br 50%; LIMA-LAD patent, SVG-LAD occluded, SVG-OM occluded, SVG-PDA occluded >> Med Rx >> continued angina >>  PCI (2/14): 2.25 x 32 mm Promus DES and 2.5 x 20 mm Promus DES to prox and mid LAD - Echo (10/15): Mild LVH, EF 55-60%, no RWMA, mod LAE, mild TR, PASP 42 mmHg - Nuclear (4/15): Low risk stress nuclear study with a fixed defect in the apical anterior, inferior walls and in the true apex consistent with distal LAD scar. No ischemia. EF 56%              Current Outpatient Prescriptions  Medication Sig Dispense Refill  . ALPRAZolam (XANAX) 0.25 MG tablet Take 0.25 mg by mouth 2 (two) times daily as needed for anxiety.    Marland Kitchen  aspirin 81 MG tablet Take 81 mg by mouth at bedtime.    . clopidogrel (PLAVIX) 75 MG tablet Take 75 mg by mouth daily.    Marland Kitchen doxycycline (VIBRAMYCIN) 100 MG capsule Take 100 mg by mouth 2 (two) times daily.    . furosemide (LASIX) 20 MG tablet Take 2 tablets (40 mg total) by mouth daily. Take 40 mg once a week and 20 mg on all other days. 90 tablet 2  . isosorbide mononitrate (IMDUR) 60 MG 24 hr tablet Take 30 mg by mouth daily.    Marland Kitchen losartan (COZAAR) 100 MG tablet Take 50 mg by mouth 2 (two) times daily.    . nitroGLYCERIN (NITROSTAT) 0.4 MG SL tablet Place 0.4 mg under the tongue every 5 (five) minutes x 3 doses as needed for chest pain.    . rosuvastatin (CRESTOR) 10 MG tablet Take 10 mg by mouth daily.    . pravastatin (PRAVACHOL) 20 MG tablet Take 20 mg by mouth daily.     No current facility-administered medications for this visit.    Allergies:   Review of patient's allergies indicates no known allergies.   Past Medical History  Diagnosis Date  . HTN (hypertension)   . HLD (hyperlipidemia)   . Edema   .  Benign neoplasm of colon   . Dyspnea on exertion   . Prostate cancer   . Osteopenia   . Emphysema   . Bronchitis   . Obesity   . Malaise and fatigue   . CAD (coronary artery disease) Dec 2011    s/p CABG per Dr. Roxy Manns; had normal EF; All SVGs occluded per follow up cath with only LIMA to LAD patent; s/p PCI of the proximal and mid LAD February 2014 per Dr. Burt Knack    Past Surgical History  Procedure Laterality Date  . Median sternotomy  08/23/10  . Coronary artery bypass graft  08/23/10  . Cervical disc surgery    . Cardiac catheterization  10/14/12  . Coronary stent placement  10/14/12    with stent to proximal and mid LAD  . Left heart catheterization with coronary/graft angiogram N/A 09/01/2012    Procedure: LEFT HEART CATHETERIZATION WITH Levi [name];  Surgeon: Levi Dresser, MD;  Location: [facility] CATH LAB;  Service: Cardiovascular;  Laterality: N/A;  .  Percutaneous coronary stent intervention (pci-s) N/A 10/14/2012    Procedure: PERCUTANEOUS CORONARY STENT INTERVENTION (PCI-S);  Surgeon: Levi Mocha, MD;  Location: [facility] CATH LAB;  Service: Cardiovascular;  Laterality: N/A;     Social History:  The patient  reports that he has quit smoking. He does not have any smokeless tobacco history on file. He reports that he does not drink alcohol or use illicit drugs.   Family History:  The patient's family history includes Heart attack in his father; Heart disease in his brother; Kidney cancer in his father; Prostate cancer in his brother; Skin cancer in his brother.    ROS:  Please see the history of present illness. All other systems are reviewed and  Negative to the above problem except as noted.    PHYSICAL EXAM: VS:  Ht '5\' 10"'$  (1.778 m)  Wt 264 lb 3.2 oz (119.84 kg)  BMI 37.91 kg/m2   BP 90/40 GEN: Well nourished, well developed, in no acute distress HEENT: normal Neck: no JVD, carotid bruits, or masses Cardiac: RRR; no murmurs, rubs, or gallops,no edema  Respiratory:  clear to auscultation bilaterally, normal work of breathing GI: soft, nontender, nondistended, + BS  No hepatomegaly  MS: no deformity Moving all extremities   Skin: warm and dry, no rash Neuro:  Strength and sensation are intact Psych: euthymic mood, full affect   EKG:  EKG is ordered today.Atrial flutter  76 bpm  RBBB.     Lipid Panel    Component Value Date/Time   CHOL 113 06/01/2014 0930   TRIG 115.0 06/01/2014 0930   HDL 32.40* 06/01/2014 0930   CHOLHDL 3 06/01/2014 0930   VLDL 23.0 06/01/2014 0930   LDLCALC 58 06/01/2014 0930   LDLDIRECT 70.7 06/10/2013 1344      Wt Readings from Last 3 Encounters:  01/06/15 264 lb 3.2 oz (119.84 kg)  11/28/14 268 lb (121.564 kg)  11/17/14 267 lb (121.11 kg)      ASSESSMENT AND PLAN:  1.  Atrial flutter  New  Not documented beofre  Most likely explains symptoms  WOuld d/c plavix  Start Xarelto.  Check labs   Sched TEE Cardioversion as he is very symotmatic  2.  Chest tightnss, SOB  Again, prob due to 1 in setting of CAD  WIll cut back cozaar and let BP run up some   Check labs  Plan for cardioversion next wk  Chest pains he is describing are atypical  (pleuritic,  positional)  3.  CAD  As above  4  HL  Keep on statin     Current medicines are reviewed at length with the patient today.  The patient does not have concerns regarding medicines.  The following changes have been made: STop Plavix  Start Xarelto    Labs/ tests ordered  No orders of the defined types were placed in this encounter.     Disposition:  PLan for TEE / Cardioversion next wk.    Signed, [name], MD  01/06/2015 [facility] [place], [place], [place]  [address] Phone: [phone]; Fax: [phone]

## 2015-01-06 NOTE — Telephone Encounter (Signed)
Spoke with wife and patient c/o chest pains with any exertion NTG and rest relieve pain When patient up and moves around chest pain returns feels hot, sweaty, nausea, and feels like he is going to pass out Blood pressure 115/72 HR 92 Per wife patient refuses to go to ED Discussed with Dr Harrington Challenger and she will see patient in office this afternoon Advised wife of appointment time and to go to ED if pain returns and does not go away or worse, verbalized understanding

## 2015-01-06 NOTE — Patient Instructions (Signed)
Medication Instructions:  Your physician has recommended you make the following change in your medication:  1) STOP Plavix 2) START Xarelto 20 mg daily - take each evening with dinner.  Samples provide and trial card   Labwork: Your physician recommends that you return for lab work TODAY (CBC, INR/PT,BMET, Trop, D-Dimer)   Testing/Procedures: Your physician has requested that you have a TEE/Cardioversion. During a TEE, sound waves are used to create images of your heart. It provides your doctor with information about the size and shape of your heart and how well your heart's chambers and valves are working. In this test, a transducer is attached to the end of a flexible tube that is guided down you throat and into your esophagus (the tube leading from your mouth to your stomach) to get a more detailed image of your heart. Once the TEE has determined that a blood clot is not present, the cardioversion begins. Electrical Cardioversion uses a jolt of electricity to your heart either through paddles or wired patches attached to your chest. This is a controlled, usually prescheduled, procedure. This procedure is done at the hospital and you are not awake during the procedure. You usually go home the day of the procedure. Please see the instruction sheet given to you today for more information.   Follow-Up: Your physician recommends that you schedule a follow-up appointment in: 3-4 weeks from 5/11 with Dr. Harrington Challenger.   Any Other Special Instructions Will Be Listed Below (If Applicable).

## 2015-01-09 ENCOUNTER — Telehealth: Payer: Self-pay | Admitting: Internal Medicine

## 2015-01-09 NOTE — Telephone Encounter (Signed)
New message     Wife calling has a questions regarding upcoming procedure on Wednesday .

## 2015-01-09 NOTE — Telephone Encounter (Signed)
Spoke with patient's wife. She said they were told Dr. Harrington Challenger was doing the TEE/Cardioversion. The instruction letter says Dr. Radford Pax is doing the procedures. Advised that if the patient would like to wait until Friday, Dr. Harrington Challenger would be available at that time. Pt stated it is fine as long as the doctor is good.  Advised if he'd like to change to Friday it would be ok, but he feels symptomatic with his atrial fibrillation and prefers not to wait.  Reviewed instruction letter with Mrs. Nie. Adv to call back with any further questions/concerns.

## 2015-01-11 ENCOUNTER — Encounter (HOSPITAL_COMMUNITY)
Admission: RE | Disposition: A | Discharge status: Home / Self Care | Payer: Medicare Other | Source: Ambulatory Visit | Attending: Cardiology

## 2015-01-11 ENCOUNTER — Telehealth: Payer: Self-pay | Admitting: Internal Medicine

## 2015-01-11 ENCOUNTER — Ambulatory Visit (HOSPITAL_COMMUNITY)
Admission: RE | Admit: 2015-01-11 | Discharge: 2015-01-11 | Disposition: A | Discharge status: Home / Self Care | Payer: Medicare Other | Source: Ambulatory Visit | Attending: Cardiology | Admitting: Cardiology

## 2015-01-11 DIAGNOSIS — M8580 Other specified disorders of bone density and structure, unspecified site: Secondary | ICD-10-CM | POA: Diagnosis not present

## 2015-01-11 DIAGNOSIS — Z8546 Personal history of malignant neoplasm of prostate: Secondary | ICD-10-CM | POA: Diagnosis not present

## 2015-01-11 DIAGNOSIS — R9431 Abnormal electrocardiogram [ECG] [EKG]: Secondary | ICD-10-CM | POA: Diagnosis not present

## 2015-01-11 DIAGNOSIS — Z6837 Body mass index (BMI) 37.0-37.9, adult: Secondary | ICD-10-CM | POA: Diagnosis not present

## 2015-01-11 DIAGNOSIS — I4892 Unspecified atrial flutter: Secondary | ICD-10-CM | POA: Diagnosis not present

## 2015-01-11 DIAGNOSIS — I440 Atrioventricular block, first degree: Secondary | ICD-10-CM | POA: Diagnosis not present

## 2015-01-11 DIAGNOSIS — Z7982 Long term (current) use of aspirin: Secondary | ICD-10-CM | POA: Insufficient documentation

## 2015-01-11 DIAGNOSIS — I10 Essential (primary) hypertension: Secondary | ICD-10-CM | POA: Diagnosis not present

## 2015-01-11 DIAGNOSIS — Z792 Long term (current) use of antibiotics: Secondary | ICD-10-CM | POA: Insufficient documentation

## 2015-01-11 DIAGNOSIS — Z7902 Long term (current) use of antithrombotics/antiplatelets: Secondary | ICD-10-CM | POA: Insufficient documentation

## 2015-01-11 DIAGNOSIS — I2510 Atherosclerotic heart disease of native coronary artery without angina pectoris: Secondary | ICD-10-CM | POA: Insufficient documentation

## 2015-01-11 DIAGNOSIS — Z79899 Other long term (current) drug therapy: Secondary | ICD-10-CM | POA: Diagnosis not present

## 2015-01-11 DIAGNOSIS — E669 Obesity, unspecified: Secondary | ICD-10-CM | POA: Diagnosis not present

## 2015-01-11 DIAGNOSIS — Z87891 Personal history of nicotine dependence: Secondary | ICD-10-CM | POA: Insufficient documentation

## 2015-01-11 DIAGNOSIS — E785 Hyperlipidemia, unspecified: Secondary | ICD-10-CM | POA: Insufficient documentation

## 2015-01-11 DIAGNOSIS — Z955 Presence of coronary angioplasty implant and graft: Secondary | ICD-10-CM | POA: Insufficient documentation

## 2015-01-11 DIAGNOSIS — Z539 Procedure and treatment not carried out, unspecified reason: Secondary | ICD-10-CM | POA: Insufficient documentation

## 2015-01-11 DIAGNOSIS — I4510 Unspecified right bundle-branch block: Secondary | ICD-10-CM | POA: Insufficient documentation

## 2015-01-11 DIAGNOSIS — Z951 Presence of aortocoronary bypass graft: Secondary | ICD-10-CM | POA: Insufficient documentation

## 2015-01-11 SURGERY — CANCELLED PROCEDURE

## 2015-01-11 MED ORDER — SODIUM CHLORIDE 0.9 % IV SOLN: INTRAVENOUS | Status: DC

## 2015-01-11 NOTE — Telephone Encounter (Signed)
New Message  Pt wife called to make 1 week f/u. Pt had TEE today 5/11 and was told by Dr. Radford Pax to f/u w/ Dr. Harrington Challenger in 1 week. No d/c instructions in system indicating 1 week f/u. Pt requested to speak w/ Rn about waht to do.

## 2015-01-11 NOTE — Interval H&P Note (Signed)
History and Physical Interval Note:  01/11/2015 10:57 AM  Levi [name]  has presented today for surgery, with the diagnosis of A FLUTTER   The various methods of treatment have been discussed with the patient and family. After consideration of risks, benefits and other options for treatment, the patient has consented to  Procedure(s): TRANSESOPHAGEAL ECHOCARDIOGRAM (TEE) (N/A) CARDIOVERSION (N/A) as a surgical intervention .  The patient's history has been reviewed, patient examined, no change in status, stable for surgery.  I have reviewed the patient's chart and labs.  Questions were answered to the patient's satisfaction.     Fatin Bachicha R

## 2015-01-11 NOTE — H&P (View-Only) (Signed)
Cardiology Office Note   Date:  01/06/2015   ID:  Levi [name], DOB 1928/12/15, MRN [id number]  PCP:  [name], MD  Cardiologist:   [name], MD   No chief complaint on file.     History of Present Illness: Levi [name] is a 81 y.o. male with a history of CAD  He is s/p CABG. All of his vein grafts are occluded and is last remaining graft is a LIMA to diagonal. He underwent PCI/DES to LAD in Feb 2014 He was seen by Ezzie Dural in clinic in March.I saw him at the end of march. He had a lexiscna myoview to eval for ischemia. THis showed small region of disttal anterior, distal inferior and apical scar LVEF normal. No ischemia.  He has been seen by Kathleen Argue BP has been up and down Last seen in December. Off of amlodipine Takes imdur extra as needed  Myoview March 2016 was normal    Patient called in complaining of chest discomfort with exertion.  Eased with NTG  Complains of fatigue  Some diaphoresis.  Nausea  Dizziness No synope   Does have some chest pains  Worse with inspiration  In epigastric area.    STUDIES:  - LHC (12/13): Dist LM 60-70%, ostial LAD 40-50%, D1 occluded, prox LAD 80% then aneurysmal segment then 80%, ostial septal perforator 99%, prox CFX 50%, OM branch serial 60% lesions, prox RCA 30%, PL Br 50%; LIMA-LAD patent, SVG-LAD occluded, SVG-OM occluded, SVG-PDA occluded >> Med Rx >> continued angina >>  PCI (2/14): 2.25 x 32 mm Promus DES and 2.5 x 20 mm Promus DES to prox and mid LAD - Echo (10/15): Mild LVH, EF 55-60%, no RWMA, mod LAE, mild TR, PASP 42 mmHg - Nuclear (4/15): Low risk stress nuclear study with a fixed defect in the apical anterior, inferior walls and in the true apex consistent with distal LAD scar. No ischemia. EF 56%              Current Outpatient Prescriptions  Medication Sig Dispense Refill  . ALPRAZolam (XANAX) 0.25 MG tablet Take 0.25 mg by mouth 2 (two) times daily as needed for anxiety.    Marland Kitchen  aspirin 81 MG tablet Take 81 mg by mouth at bedtime.    . clopidogrel (PLAVIX) 75 MG tablet Take 75 mg by mouth daily.    Marland Kitchen doxycycline (VIBRAMYCIN) 100 MG capsule Take 100 mg by mouth 2 (two) times daily.    . furosemide (LASIX) 20 MG tablet Take 2 tablets (40 mg total) by mouth daily. Take 40 mg once a week and 20 mg on all other days. 90 tablet 2  . isosorbide mononitrate (IMDUR) 60 MG 24 hr tablet Take 30 mg by mouth daily.    Marland Kitchen losartan (COZAAR) 100 MG tablet Take 50 mg by mouth 2 (two) times daily.    . nitroGLYCERIN (NITROSTAT) 0.4 MG SL tablet Place 0.4 mg under the tongue every 5 (five) minutes x 3 doses as needed for chest pain.    . rosuvastatin (CRESTOR) 10 MG tablet Take 10 mg by mouth daily.    . pravastatin (PRAVACHOL) 20 MG tablet Take 20 mg by mouth daily.     No current facility-administered medications for this visit.    Allergies:   Review of patient's allergies indicates no known allergies.   Past Medical History  Diagnosis Date  . HTN (hypertension)   . HLD (hyperlipidemia)   . Edema   .  Benign neoplasm of colon   . Dyspnea on exertion   . Prostate cancer   . Osteopenia   . Emphysema   . Bronchitis   . Obesity   . Malaise and fatigue   . CAD (coronary artery disease) Dec 2011    s/p CABG per Dr. Roxy Manns; had normal EF; All SVGs occluded per follow up cath with only LIMA to LAD patent; s/p PCI of the proximal and mid LAD February 2014 per Dr. Burt Knack    Past Surgical History  Procedure Laterality Date  . Median sternotomy  08/23/10  . Coronary artery bypass graft  08/23/10  . Cervical disc surgery    . Cardiac catheterization  10/14/12  . Coronary stent placement  10/14/12    with stent to proximal and mid LAD  . Left heart catheterization with coronary/graft angiogram N/A 09/01/2012    Procedure: LEFT HEART CATHETERIZATION WITH [name];  Surgeon: [name], MD;  Location: [facility] CATH LAB;  Service: Cardiovascular;  Laterality: N/A;  .  Percutaneous coronary stent intervention (pci-s) N/A 10/14/2012    Procedure: PERCUTANEOUS CORONARY STENT INTERVENTION (PCI-S);  Surgeon: [name], MD;  Location: [facility] CATH LAB;  Service: Cardiovascular;  Laterality: N/A;     Social History:  The patient  reports that he has quit smoking. He does not have any smokeless tobacco history on file. He reports that he does not drink alcohol or use illicit drugs.   Family History:  The patient's family history includes Heart attack in his father; Heart disease in his brother; Kidney cancer in his father; Prostate cancer in his brother; Skin cancer in his brother.    ROS:  Please see the history of present illness. All other systems are reviewed and  Negative to the above problem except as noted.    PHYSICAL EXAM: VS:  Ht '5\' 10"'$  (1.778 m)  Wt 264 lb 3.2 oz (119.84 kg)  BMI 37.91 kg/m2   BP 90/40 GEN: Well nourished, well developed, in no acute distress HEENT: normal Neck: no JVD, carotid bruits, or masses Cardiac: RRR; no murmurs, rubs, or gallops,no edema  Respiratory:  clear to auscultation bilaterally, normal work of breathing GI: soft, nontender, nondistended, + BS  No hepatomegaly  MS: no deformity Moving all extremities   Skin: warm and dry, no rash Neuro:  Strength and sensation are intact Psych: euthymic mood, full affect   EKG:  EKG is ordered today.Atrial flutter  76 bpm  RBBB.     Lipid Panel    Component Value Date/Time   CHOL 113 06/01/2014 0930   TRIG 115.0 06/01/2014 0930   HDL 32.40* 06/01/2014 0930   CHOLHDL 3 06/01/2014 0930   VLDL 23.0 06/01/2014 0930   LDLCALC 58 06/01/2014 0930   LDLDIRECT 70.7 06/10/2013 1344      Wt Readings from Last 3 Encounters:  01/06/15 264 lb 3.2 oz (119.84 kg)  11/28/14 268 lb (121.564 kg)  11/17/14 267 lb (121.11 kg)      ASSESSMENT AND PLAN:  1.  Atrial flutter  New  Not documented beofre  Most likely explains symptoms  WOuld d/c plavix  Start Xarelto.  Check labs   Sched TEE Cardioversion as he is very symotmatic  2.  Chest tightnss, SOB  Again, prob due to 1 in setting of CAD  WIll cut back cozaar and let BP run up some   Check labs  Plan for cardioversion next wk  Chest pains he is describing are atypical  (pleuritic,  positional)  3.  CAD  As above  4  HL  Keep on statin     Current medicines are reviewed at length with the patient today.  The patient does not have concerns regarding medicines.  The following changes have been made: STop Plavix  Start Xarelto    Labs/ tests ordered  No orders of the defined types were placed in this encounter.     Disposition:  PLan for TEE / Cardioversion next wk.    Signed, [name], MD  01/06/2015 [facility] [place], [place], [place]  [address] Phone: [phone]; Fax: [phone]

## 2015-01-11 NOTE — Telephone Encounter (Signed)
Wife calling stating his TEE was cancelled this AM because he was in normal rhythm.  Scheduled f/u appointment for 5/23 w/Michele Lenze,PA. Advised if goes back into [employer] to call the office.

## 2015-01-13 ENCOUNTER — Telehealth: Payer: Self-pay

## 2015-01-13 NOTE — Telephone Encounter (Signed)
Prior auth sent to [facility] for Xarelto '20mg'$  via fax.

## 2015-01-13 NOTE — Telephone Encounter (Signed)
Prior auth sent to [facility] for [employer]. [id number].

## 2015-01-23 ENCOUNTER — Telehealth: Payer: Self-pay

## 2015-01-23 ENCOUNTER — Ambulatory Visit (INDEPENDENT_AMBULATORY_CARE_PROVIDER_SITE_OTHER): Payer: Medicare Other | Admitting: Physician Assistant

## 2015-01-23 ENCOUNTER — Encounter: Payer: Self-pay | Admitting: Physician Assistant

## 2015-01-23 VITALS — BP 140/70 | HR 53 | Ht 70.0 in | Wt 266.1 lb

## 2015-01-23 DIAGNOSIS — J438 Other emphysema: Secondary | ICD-10-CM | POA: Diagnosis not present

## 2015-01-23 DIAGNOSIS — I4892 Unspecified atrial flutter: Secondary | ICD-10-CM

## 2015-01-23 DIAGNOSIS — E669 Obesity, unspecified: Secondary | ICD-10-CM

## 2015-01-23 DIAGNOSIS — I4891 Unspecified atrial fibrillation: Secondary | ICD-10-CM | POA: Diagnosis not present

## 2015-01-23 MED ORDER — RIVAROXABAN 20 MG PO TABS: 20.0000 mg | ORAL_TABLET | Freq: Every day | ORAL | Status: DC

## 2015-01-23 NOTE — Assessment & Plan Note (Signed)
Weight loss recommended. Think it could be contributing to his dyspnea on exertion.

## 2015-01-23 NOTE — Telephone Encounter (Signed)
[facility] Prior Auth [id number] for Xarelto has been approved through 5/11/207

## 2015-01-23 NOTE — Progress Notes (Signed)
Cardiology Office Note   Date:  01/23/2015   ID:  Levi [name], DOB 05/17/1929, MRN [id number]  PCP:  [name], MD  Cardiologist:  [name], MD  Chief Complaint: Dyspnea    History of Present Illness: Levi [name] is a 81 y.o. male who presents for follow-up of atrial flutter. The patient was scheduled for TEE guided cardioversion but was in sinus rhythm and it was canceled. He was complaining of dyspnea on exertion which she continues to have and it has not improved since he is in sinus rhythm. Dr. Harrington Challenger felt some of his symptoms may have been coming from his atrial flutter. He was also having chest pain but has not had any further chest pain. He is overweight and she is tobacco. He quit smoking 30 years ago but was a heavy smoker at one point.    Past Medical History  Diagnosis Date  . HTN (hypertension)   . HLD (hyperlipidemia)   . Edema   . Benign neoplasm of colon   . Dyspnea on exertion   . Prostate cancer   . Osteopenia   . Emphysema   . Bronchitis   . Obesity   . Malaise and fatigue   . CAD (coronary artery disease) Dec 2011    s/p CABG per Dr. Roxy Manns; had normal EF; All SVGs occluded per follow up cath with only LIMA to LAD patent; s/p PCI of the proximal and mid LAD February 2014 per Dr. Burt Knack    Past Surgical History  Procedure Laterality Date  . Median sternotomy  08/23/10  . Coronary artery bypass graft  08/23/10  . Cervical disc surgery    . Cardiac catheterization  10/14/12  . Coronary stent placement  10/14/12    with stent to proximal and mid LAD  . Left heart catheterization with coronary/graft angiogram N/A 09/01/2012    Procedure: LEFT HEART CATHETERIZATION WITH [name];  Surgeon: [name], MD;  Location: [facility] CATH LAB;  Service: Cardiovascular;  Laterality: N/A;  . Percutaneous coronary stent intervention (pci-s) N/A 10/14/2012    Procedure: PERCUTANEOUS CORONARY STENT INTERVENTION (PCI-S);  Surgeon: [name], MD;   Location: [facility] CATH LAB;  Service: Cardiovascular;  Laterality: N/A;     Current Outpatient Prescriptions  Medication Sig Dispense Refill  . ALPRAZolam (XANAX) 0.25 MG tablet Take 0.25 mg by mouth 2 (two) times daily as needed for anxiety.    Marland Kitchen aspirin 81 MG tablet Take 1 tablet (81 mg total) by mouth at bedtime. 30 tablet   . doxycycline (VIBRAMYCIN) 100 MG capsule Take 100 mg by mouth 2 (two) times daily.    . furosemide (LASIX) 20 MG tablet Take 2 tablets (40 mg total) by mouth daily. Take 40 mg once a week and 20 mg on all other days. 90 tablet 2  . isosorbide mononitrate (IMDUR) 60 MG 24 hr tablet Take 30 mg by mouth daily.    Marland Kitchen losartan (COZAAR) 100 MG tablet Take 50 mg by mouth 2 (two) times daily.    . nitroGLYCERIN (NITROSTAT) 0.4 MG SL tablet Place 0.4 mg under the tongue every 5 (five) minutes x 3 doses as needed for chest pain.    . pravastatin (PRAVACHOL) 20 MG tablet Take 20 mg by mouth daily.    . rivaroxaban (XARELTO) 20 MG TABS tablet Take 1 tablet (20 mg total) by mouth daily with supper. 30 tablet 3  . rosuvastatin (CRESTOR) 10 MG tablet Take 10 mg by mouth daily.  No current facility-administered medications for this visit.    Allergies:   Review of patient's allergies indicates no known allergies.    Social History:  The patient  reports that he has quit smoking. He does not have any smokeless tobacco history on file. He reports that he does not drink alcohol or use illicit drugs.   Family History:  The patient's    family history includes Heart attack in his father; Heart disease in his brother; Kidney cancer in his father; Prostate cancer in his brother; Skin cancer in his brother.    ROS:  Please see the history of present illness.   Otherwise, review of systems are positive for none.   All other systems are reviewed and negative.    PHYSICAL EXAM: VS:  BP 140/70 mmHg  Pulse 53  Ht '5\' 10"'$  (1.778 m)  Wt 266 lb 1.9 oz (120.711 kg)  BMI 38.18 kg/m2 , BMI Body  mass index is 38.18 kg/(m^2). GEN: Obese, in no acute distress Neck: no JVD, HJR, carotid bruits, or masses Cardiac: RRR; 2/6 systolic murmur at the left sternal border, no gallop, rubs, thrill or heave,  Respiratory:  Decreased breath sounds with fine crackles GI: soft, nontender, nondistended, + BS MS: no deformity or atrophy Extremities: without cyanosis, clubbing, edema, good distal pulses bilaterally.  Skin: warm and dry, no rash Neuro:  Strength and sensation are intact    EKG:  EKG is ordered today. The ekg ordered today demonstrates sinus bradycardia with first-degree AV block, right bundle branch block, LVH nonspecific ST-T wave changes, no acute change Recent Labs: 07/01/2014: ALT 12 11/17/2014: Hemoglobin 14.7; Platelets 244.0; Pro B Natriuretic peptide (BNP) 141.0*; TSH 2.52 01/06/2015: BUN 21; Creatinine 1.38; Potassium 4.0; Sodium 138    Lipid Panel    Component Value Date/Time   CHOL 113 06/01/2014 0930   TRIG 115.0 06/01/2014 0930   HDL 32.40* 06/01/2014 0930   CHOLHDL 3 06/01/2014 0930   VLDL 23.0 06/01/2014 0930   LDLCALC 58 06/01/2014 0930   LDLDIRECT 70.7 06/10/2013 1344      Wt Readings from Last 3 Encounters:  01/23/15 266 lb 1.9 oz (120.711 kg)  01/06/15 264 lb 3.2 oz (119.84 kg)  11/28/14 268 lb (121.564 kg)      Other studies Reviewed: Additional studies/ records that were reviewed today include and review of the records demonstrates:  He is s/p CABG. All of his vein grafts are occluded and is last remaining graft is a LIMA to diagonal. He underwent PCI/DES to  LAD in Feb 2014  He was seen by Ezzie Dural in clinic in March.I saw him at the end of march. He had a lexiscna myoview to eval for ischemia.  THis showed small region of disttal anterior, distal inferior and apical scar  LVEF normal.  No ischemia.     He has been seen by Kathleen Argue  BP has been up and down  Last seen in December.  Off of amlodipine  Takes imdur extra as needed  Myoview March 2016  was normal    STUDIES:     - LHC (12/13):  Dist LM 60-70%, ostial LAD 40-50%, D1 occluded, prox LAD 80% then aneurysmal segment then 80%, ostial septal perforator 99%, prox CFX 50%, OM branch serial 60% lesions, prox RCA 30%, PL Br 50%; LIMA-LAD patent, SVG-LAD occluded, SVG-OM occluded, SVG-PDA occluded >> Med Rx >> continued angina >>   PCI (2/14):  2.25 x 32 mm Promus DES and 2.5 x 20  mm Promus DES to prox and mid LAD  - Echo (10/15):  Mild LVH, EF 55-60%, no RWMA, mod LAE, mild TR, PASP 42 mmHg  - Nuclear (4/15):  Low risk stress nuclear study with a fixed defect in the apical anterior, inferior walls and in the true apex consistent with distal LAD scar. No ischemia. EF 56%  ASSESSMENT AND PLAN:   Sumner Boast, PA-C  01/23/2015 11:48 AM    [facility] [address], [place], [place]  [address] Phone: [phone]; Fax: [phone]

## 2015-01-23 NOTE — Assessment & Plan Note (Signed)
Patient was scheduled for TEE guided cardioversion but was found to be in sinus rhythm. He is maintaining sinus rhythm. Continue Xarelto for now. His heart rate is 53 and he is not on any rate lowering medications. He continues to complain of dyspnea on exertion. I wonder if it's due to his obesity and deconditioning. He had a normal stress Myoview in March 2016. He also has history of excessive cigarette smoking and could have COPD. Consider pulmonary evaluation.

## 2015-01-23 NOTE — Assessment & Plan Note (Signed)
He continues to complain of dyspnea on exertion. I wonder if it's due to his obesity and deconditioning. He had a normal stress Myoview in March 2016. He also has history of excessive cigarette smoking and could have COPD. Consider pulmonary evaluation.

## 2015-01-23 NOTE — Patient Instructions (Signed)
Medication Instructions:  Your physician recommends that you continue on your current medications as directed. Please refer to the Current Medication list given to you today.   Labwork:   Testing/Procedures:   Follow-Up:  WITH DR ROSS IN 2 TO 3 MONTHS    Any Other Special Instructions Will Be Listed Below (If Applicable).

## 2015-01-24 NOTE — Telephone Encounter (Signed)
That's good

## 2015-02-27 ENCOUNTER — Ambulatory Visit (INDEPENDENT_AMBULATORY_CARE_PROVIDER_SITE_OTHER): Payer: Medicare Other | Admitting: Internal Medicine

## 2015-02-27 ENCOUNTER — Other Ambulatory Visit: Payer: Self-pay | Admitting: Internal Medicine

## 2015-02-27 ENCOUNTER — Encounter: Payer: Self-pay | Admitting: Internal Medicine

## 2015-02-27 VITALS — BP 158/90 | HR 50 | Ht 70.0 in | Wt 269.0 lb

## 2015-02-27 DIAGNOSIS — I4891 Unspecified atrial fibrillation: Secondary | ICD-10-CM | POA: Diagnosis not present

## 2015-02-27 LAB — BASIC METABOLIC PANEL
BUN: 14 mg/dL (ref 6–23)
CHLORIDE: 105 meq/L (ref 96–112)
CO2: 29 mEq/L (ref 19–32)
Calcium: 8.7 mg/dL (ref 8.4–10.5)
Creatinine, Ser: 0.97 mg/dL (ref 0.40–1.50)
GFR: 78.37 mL/min (ref >60.00)
Glucose, Bld: 94 mg/dL (ref 70–99)
POTASSIUM: 4.1 meq/L (ref 3.5–5.1)
SODIUM: 138 meq/L (ref 135–145)

## 2015-02-27 LAB — CBC
HCT: 41.9 % (ref 39.0–52.0)
HEMOGLOBIN: 13.9 g/dL (ref 13.0–17.0)
MCHC: 33.1 g/dL (ref 30.0–36.0)
MCV: 92.5 fl (ref 78.0–100.0)
PLATELETS: 210 10*3/uL (ref 150.0–400.0)
RBC: 4.53 Mil/uL (ref 4.22–5.81)
RDW: 13.7 % (ref 11.5–15.5)
WBC: 7.2 10*3/uL (ref 4.0–10.5)

## 2015-02-27 NOTE — Patient Instructions (Signed)
Medication Instructions:  Your physician recommends that you continue on your current medications as directed. Please refer to the Current Medication list given to you today.   Labwork: Lab work to be done today--BMP, CBC  Testing/Procedures: none  Follow-Up: Your physician recommends that you schedule a follow-up appointment in: about 8 weeks with Dr. Harrington Challenger   Please call our office this afternoon [phone]) with the medications you are taking so we can update your list.

## 2015-02-27 NOTE — Progress Notes (Signed)
Cardiology Office Note   Date:  02/27/2015   ID:  Levi [name], DOB [birth year], MRN [id number]  PCP:  [name], MD  Cardiologist:   [name], MD   No chief complaint on file.  Pt presents for f/u of atrial flutter   History of Present Illness: Levi [name] is a 82 y.o. male with a history of atrial flutter.  Had cardioversion scheduled but converted on own.   Cant walk without getting SOB  Rides mower   No dizziness NO CP    Last night talked like drunk  BP 180s to 200s     BP better today  Pt feeling better      Current Outpatient Prescriptions  Medication Sig Dispense Refill  . ALPRAZolam (XANAX) 0.25 MG tablet Take 0.25 mg by mouth 2 (two) times daily as needed for anxiety.    Marland Kitchen aspirin 81 MG tablet Take 1 tablet (81 mg total) by mouth at bedtime. 30 tablet   . doxycycline (VIBRAMYCIN) 100 MG capsule Take 100 mg by mouth 2 (two) times daily.    . furosemide (LASIX) 20 MG tablet Take 2 tablets (40 mg total) by mouth daily. Take 40 mg once a week and 20 mg on all other days. 90 tablet 2  . isosorbide mononitrate (IMDUR) 60 MG 24 hr tablet Take 30 mg by mouth daily.    Marland Kitchen losartan (COZAAR) 100 MG tablet Take 50 mg by mouth 2 (two) times daily.    . nitroGLYCERIN (NITROSTAT) 0.4 MG SL tablet Place 0.4 mg under the tongue every 5 (five) minutes x 3 doses as needed for chest pain.    . pravastatin (PRAVACHOL) 20 MG tablet Take 20 mg by mouth daily.    . rivaroxaban (XARELTO) 20 MG TABS tablet Take 1 tablet (20 mg total) by mouth daily with supper. 30 tablet 8  . rosuvastatin (CRESTOR) 10 MG tablet Take 10 mg by mouth daily.     No current facility-administered medications for this visit.    Allergies:   Review of patient's allergies indicates no known allergies.   Past Medical History  Diagnosis Date  . HTN (hypertension)   . HLD (hyperlipidemia)   . Edema   . Benign neoplasm of colon   . Dyspnea on exertion   . Prostate cancer   . Osteopenia   .  Emphysema   . Bronchitis   . Obesity   . Malaise and fatigue   . CAD (coronary artery disease) Dec 2011    s/p CABG per Dr. Roxy Manns; had normal EF; All SVGs occluded per follow up cath with only LIMA to LAD patent; s/p PCI of the proximal and mid LAD February 2014 per Dr. Burt Knack    Past Surgical History  Procedure Laterality Date  . Median sternotomy  08/23/10  . Coronary artery bypass graft  08/23/10  . Cervical disc surgery    . Cardiac catheterization  10/14/12  . Coronary stent placement  10/14/12    with stent to proximal and mid LAD  . Left heart catheterization with coronary/graft angiogram N/A 09/01/2012    Procedure: LEFT HEART CATHETERIZATION WITH [name];  Surgeon: [name], MD;  Location: [facility] CATH LAB;  Service: Cardiovascular;  Laterality: N/A;  . Percutaneous coronary stent intervention (pci-s) N/A 10/14/2012    Procedure: PERCUTANEOUS CORONARY STENT INTERVENTION (PCI-S);  Surgeon: [name], MD;  Location: [facility] CATH LAB;  Service: Cardiovascular;  Laterality: N/A;     Social History:  The  patient  reports that he has quit smoking. He does not have any smokeless tobacco history on file. He reports that he does not drink alcohol or use illicit drugs.   Family History:  The patient's family history includes Heart attack in his father; Heart disease in his brother; Kidney cancer in his father; Prostate cancer in his brother; Skin cancer in his brother.    ROS:  Please see the history of present illness. All other systems are reviewed and  Negative to the above problem except as noted.    PHYSICAL EXAM: VS:  BP 158/90 mmHg  Pulse 50  Ht '5\' 10"'$  (1.778 m)  Wt 269 lb (122.018 kg)  BMI 38.60 kg/m2  SpO2 97%  GEN: Well nourished, well developed, in no acute distress HEENT: normal Neck: no JVD, carotid bruits, or masses Cardiac: RRR; no murmurs, rubs, or gallops,no edema  Respiratory:  clear to auscultation bilaterally, normal work of breathing GI:  soft, nontender, nondistended, + BS  No hepatomegaly  MS: no deformity Moving all extremities   Skin: warm and dry, no rash Neuro:  Strength and sensation are intact Psych: euthymic mood, full affect   EKG:  EKG is not ordered today.   Lipid Panel    Component Value Date/Time   CHOL 113 06/01/2014 0930   TRIG 115.0 06/01/2014 0930   HDL 32.40* 06/01/2014 0930   CHOLHDL 3 06/01/2014 0930   VLDL 23.0 06/01/2014 0930   LDLCALC 58 06/01/2014 0930   LDLDIRECT 70.7 06/10/2013 1344      Wt Readings from Last 3 Encounters:  02/27/15 269 lb (122.018 kg)  01/23/15 266 lb 1.9 oz (120.711 kg)  01/06/15 264 lb 3.2 oz (119.84 kg)      ASSESSMENT AND PLAN:  1   Atrial flutter APpears to be in SR on exam.  Keep on anticoagulation  2.  HTN  BP is lable  WIll need to confirm meds  He was unsure of list today    3.  HL  Continue on crestor    F/U in winter    Signed, [name], MD  02/27/2015 9:46 AM    [address], [place],   [address] Phone: [phone]; Fax: [phone]

## 2015-02-27 NOTE — Telephone Encounter (Signed)
Patient calling and informed the office of what he is taking and not taking. Patient is not taking doxycycline, xanax or xarelto. Patient is only taking 20 mg of Lasix daily. Patient states he can't afford paying for xarelto. Patient wants to know if there is anything that is cheaper that he can take. Patient received samples of xarelto today for a couple of weeks. Encouraged patient to take what he has for now and this message would be sent to Dr. Harrington Challenger. Patient verbalized understanding. Will route to Dr. Harrington Challenger for advisement.

## 2015-02-27 NOTE — Telephone Encounter (Signed)
New Prob    Pt has some questions regarding his medications. Please call.

## 2015-02-28 NOTE — Telephone Encounter (Signed)
Routing to [employer] for follow up on insurance approval/cost for Pradaxa.

## 2015-02-28 NOTE — Telephone Encounter (Signed)
We did not discuss cost of xarelto on visit ? If pradaxa cheaper. Need to check if he qualifies for program

## 2015-03-01 MED ORDER — AMLODIPINE BESYLATE 2.5 MG PO TABS: 2.5000 mg | ORAL_TABLET | Freq: Every day | ORAL | Status: DC

## 2015-03-01 NOTE — Telephone Encounter (Signed)
Denies any chest pain, typical shortness of breath, no worse.  No headache, visual changes or weakness.  Denies changes in sodium intake. Noted in office note patient blood pressure stated by provider to be labile. Also note that Dr. Harrington Challenger wants to start him on amlodipine 2.5 mg daily with a nurse visit in one month Sent to scheduling to arrange. RX sent locally for one moth with three refills and told him to call back when he gets low so we can send it to his mail order pharmacy.  Patient went over each medication of what he is taking it reading off the bottle.  Has received 2 weeks sample of Xarelto 20 mg he is now taking and is taking Clopidogrel 75 mg every night.  Blood pressures of his record are 6/28 7a 170/74 6/28 330p 176/73 6/29 630 am 199/87 6/29 8am 129/60 6/29 815a 180/75  Medications that he said he is currently taking as read from his bottles at home are:  Lasix 20 mg every morning Provachol 20 mg every am Isorbide 30 mg every an Clopidogrel 75 mg every night lorsartan 50 mg twice a day Asa 81 mg daily  Xarelto 20 mg (has 2 weeks of samples)  Crestor   Called patient instructed him to take plavix out of his medicine bottle box and not to take anymore

## 2015-03-01 NOTE — Telephone Encounter (Signed)
Follow up    Pt c/o BP issue: STAT if pt c/o blurred vision, one-sided weakness or slurred speech  1. What are your last 5 BP readings? Yesterday 7am 170/74; 3:30p 176/73; this morning 6:30am 199/87; 8:00am 129/60; 8:15am 180/75   2. Are you having any other symptoms (ex. Dizziness, headache, blurred vision, passed out)? dizziness  3. What is your BP issue? B/p is going up and down   Please advise

## 2015-03-02 ENCOUNTER — Telehealth: Payer: Self-pay

## 2015-03-02 ENCOUNTER — Encounter: Payer: Self-pay | Admitting: *Deleted

## 2015-03-02 ENCOUNTER — Telehealth: Payer: Self-pay | Admitting: *Deleted

## 2015-03-02 ENCOUNTER — Ambulatory Visit (INDEPENDENT_AMBULATORY_CARE_PROVIDER_SITE_OTHER): Payer: Medicare Other | Admitting: *Deleted

## 2015-03-02 VITALS — BP 138/72 | HR 52 | Wt 267.0 lb

## 2015-03-02 DIAGNOSIS — I10 Essential (primary) hypertension: Secondary | ICD-10-CM

## 2015-03-02 DIAGNOSIS — I4892 Unspecified atrial flutter: Secondary | ICD-10-CM

## 2015-03-02 MED ORDER — RIVAROXABAN 20 MG PO TABS: 20.0000 mg | ORAL_TABLET | Freq: Every day | ORAL | Status: DC

## 2015-03-02 NOTE — Telephone Encounter (Signed)
Xarelto '20mg'$  is a Tier 3 and would cost $90.00 for a 90 day supply. Pradaxa would require another PA and would cost about as much as Xarelto. I will call the patient with this info.

## 2015-03-02 NOTE — Telephone Encounter (Signed)
CONFIRMED WITH DR ROSS, PATIENT IS TO BE OFF PLAVIX. CALLED PATIENT TO CONFIRM HE WILL STOP TAKING PLAVIX.  CONTINUE ALL OTHER MEDICATIONS.

## 2015-03-02 NOTE — Telephone Encounter (Signed)
PATIENT WAS CONTACTED YESTERDAY AND INSTRUCTED TO STOP PLAVIX PER PHONE NOTE 03/01/15.

## 2015-03-02 NOTE — Progress Notes (Signed)
1.) Reason for visit: Blood Pressure Check  Name of MD requesting visit:  Dr. Harrington Challenger  History of hypertention  2.) Assessment and plan per MD:   Return in one month for nurse blood pressure check

## 2015-03-02 NOTE — Telephone Encounter (Signed)
Spoke with patient about Xarelto. He is agreeable to taking it. Will send a Rx for 90 days to his mail order Co.

## 2015-03-02 NOTE — Telephone Encounter (Signed)
Attempted to call patient. Left message on machine.

## 2015-03-02 NOTE — Patient Instructions (Signed)
Return in one month for nurse visit: blood pressure check.    Continue to monitor and record blood pressure.  Call if you have any questions, concerns or problems regarding blood pressure.

## 2015-03-02 NOTE — Telephone Encounter (Signed)
Patient's wife just called, wishing to speak with Dr. Harrington Challenger. They are having second thoughts about the Xarelto. Asking if you would allow him to take Coumadin instead. [facility], so is thinking they may come together. Please advise.

## 2015-03-07 NOTE — Telephone Encounter (Signed)
Spoke with pt.  His wife has a Coumadin appt on 7/6 and will discuss switching from Xarelto to Coumadin at that time.  He states Xarelto is too expensive to continue

## 2015-03-08 ENCOUNTER — Other Ambulatory Visit: Payer: Self-pay | Admitting: *Deleted

## 2015-03-08 ENCOUNTER — Telehealth: Payer: Self-pay | Admitting: *Deleted

## 2015-03-08 MED ORDER — WARFARIN SODIUM 5 MG PO TABS: 5.0000 mg | ORAL_TABLET | Freq: Every day | ORAL | Status: DC

## 2015-03-08 NOTE — Telephone Encounter (Signed)
Patient in the office today to start Coumadin.  Advised to overlap Xarelto and Coumadin for 3 days, he verbalized understanding. Appointment set for 1 week.

## 2015-03-15 ENCOUNTER — Ambulatory Visit (INDEPENDENT_AMBULATORY_CARE_PROVIDER_SITE_OTHER): Payer: Medicare Other | Admitting: Pharmacist

## 2015-03-15 DIAGNOSIS — I4891 Unspecified atrial fibrillation: Secondary | ICD-10-CM

## 2015-03-15 LAB — POCT INR: INR: 2.4

## 2015-03-16 ENCOUNTER — Other Ambulatory Visit: Payer: Self-pay | Admitting: *Deleted

## 2015-03-16 MED ORDER — ISOSORBIDE MONONITRATE ER 60 MG PO TB24: 30.0000 mg | ORAL_TABLET | Freq: Every day | ORAL | Status: DC

## 2015-03-17 ENCOUNTER — Encounter: Payer: Self-pay | Admitting: Internal Medicine

## 2015-03-17 ENCOUNTER — Encounter: Payer: Self-pay | Admitting: Gastroenterology

## 2015-03-22 ENCOUNTER — Ambulatory Visit (INDEPENDENT_AMBULATORY_CARE_PROVIDER_SITE_OTHER): Payer: Medicare Other | Admitting: *Deleted

## 2015-03-22 DIAGNOSIS — I4891 Unspecified atrial fibrillation: Secondary | ICD-10-CM | POA: Diagnosis not present

## 2015-03-22 LAB — POCT INR: INR: 3.7

## 2015-03-24 ENCOUNTER — Other Ambulatory Visit: Payer: Self-pay | Admitting: Internal Medicine

## 2015-03-24 MED ORDER — WARFARIN SODIUM 5 MG PO TABS: 5.0000 mg | ORAL_TABLET | Freq: Every day | ORAL | Status: DC

## 2015-03-31 ENCOUNTER — Ambulatory Visit (INDEPENDENT_AMBULATORY_CARE_PROVIDER_SITE_OTHER): Payer: Medicare Other

## 2015-03-31 VITALS — BP 158/80 | HR 55 | Wt 271.0 lb

## 2015-03-31 DIAGNOSIS — I10 Essential (primary) hypertension: Secondary | ICD-10-CM

## 2015-03-31 DIAGNOSIS — I4891 Unspecified atrial fibrillation: Secondary | ICD-10-CM

## 2015-03-31 LAB — POCT INR: INR: 2.3

## 2015-03-31 MED ORDER — AMLODIPINE BESYLATE 2.5 MG PO TABS: 2.5000 mg | ORAL_TABLET | Freq: Two times a day (BID) | ORAL | Status: DC

## 2015-03-31 NOTE — Patient Instructions (Signed)
Increase Amlodipine to 2.5 mg twice a day  Follow up appointment is August 29th at 1045am  Call for any problems, questions, concerns or problems

## 2015-03-31 NOTE — Progress Notes (Signed)
Here for one  month blood pressure check.  Has been tracking blood pressure several times a day.  1st AM blood pressures: 7/15 174/77 p73 7/16 195/80 p51 7/17 157/72 p57 7/18 191/83 p49 7/19 169/83 p54 7/20 134/66 p55 7/21 166/79 p57  7/22 207/85 p47  7/23 179/85 p52 7/24 144/71 p48  7/25 175/82 p53  Dr. Harrington Challenger ordered Amlodipine 2.5 mg to be increased to twice a day.  Recheck at end of August.  Appointment August 29th at 1045am   Patient understands to take Amlodipine 2.5 mg morning and night.  Let patient know date and time of next appointment as instructed by Dr. Harrington Challenger to be seen at the end of next month

## 2015-04-04 ENCOUNTER — Telehealth: Payer: Self-pay | Admitting: Internal Medicine

## 2015-04-04 NOTE — Telephone Encounter (Signed)
New message       Pt said nurse need to fax clinical info to [facility] regarding his amlodopine 2.'5mg'$ .  Their fax number is [phone]

## 2015-04-07 ENCOUNTER — Ambulatory Visit (INDEPENDENT_AMBULATORY_CARE_PROVIDER_SITE_OTHER): Payer: Medicare Other | Admitting: Pharmacist

## 2015-04-07 ENCOUNTER — Other Ambulatory Visit: Payer: Self-pay

## 2015-04-07 DIAGNOSIS — I4891 Unspecified atrial fibrillation: Secondary | ICD-10-CM | POA: Diagnosis not present

## 2015-04-07 LAB — POCT INR: INR: 2.9

## 2015-04-07 MED ORDER — AMLODIPINE BESYLATE 2.5 MG PO TABS: 2.5000 mg | ORAL_TABLET | Freq: Two times a day (BID) | ORAL | Status: DC

## 2015-04-07 NOTE — Telephone Encounter (Signed)
Left message to call back. New rx sent to [facility] for Amlodipine 2.'5mg'$  bid #180/3 refills.

## 2015-04-07 NOTE — Telephone Encounter (Signed)
Patient is still waiting on his Pravastatin and Warfarin, both of which were ordered on 03/24/2015.

## 2015-04-19 ENCOUNTER — Ambulatory Visit (INDEPENDENT_AMBULATORY_CARE_PROVIDER_SITE_OTHER): Payer: Medicare Other | Admitting: *Deleted

## 2015-04-19 DIAGNOSIS — I4891 Unspecified atrial fibrillation: Secondary | ICD-10-CM | POA: Diagnosis not present

## 2015-04-19 LAB — POCT INR: INR: 1.7

## 2015-05-01 ENCOUNTER — Ambulatory Visit: Payer: Medicare Other | Admitting: Internal Medicine

## 2015-05-05 ENCOUNTER — Encounter: Payer: Self-pay | Admitting: Internal Medicine

## 2015-05-05 ENCOUNTER — Ambulatory Visit (INDEPENDENT_AMBULATORY_CARE_PROVIDER_SITE_OTHER): Payer: Medicare Other | Admitting: *Deleted

## 2015-05-05 ENCOUNTER — Ambulatory Visit (INDEPENDENT_AMBULATORY_CARE_PROVIDER_SITE_OTHER): Payer: Medicare Other | Admitting: Internal Medicine

## 2015-05-05 VITALS — BP 132/62 | HR 52 | Ht 70.0 in | Wt 273.0 lb

## 2015-05-05 DIAGNOSIS — I4891 Unspecified atrial fibrillation: Secondary | ICD-10-CM | POA: Diagnosis not present

## 2015-05-05 DIAGNOSIS — I10 Essential (primary) hypertension: Secondary | ICD-10-CM

## 2015-05-05 DIAGNOSIS — E785 Hyperlipidemia, unspecified: Secondary | ICD-10-CM | POA: Diagnosis not present

## 2015-05-05 LAB — POCT INR: INR: 2.8

## 2015-05-05 NOTE — Progress Notes (Signed)
Cardiology Office Note   Date:  05/05/2015   ID:  GURDEEP KEESEY, DOB 11/29/1928, MRN [id number]  PCP:  [name], MD  Cardiologist:   [name], MD   No chief complaint on file.     History of Present Illness: Levi [name] is a 81 y.o. male with a history of  atrial flutter, HTN, CAD  And SOB  I sw the pt in June.    Since seen he denies palpitations.   BP in early AM before meds can be high  Later in day ok He does get SOB with activity  No wheezing   Denies CP    Current Outpatient Prescriptions  Medication Sig Dispense Refill  . amLODipine (NORVASC) 2.5 MG tablet Take 1 tablet (2.5 mg total) by mouth 2 (two) times daily. 180 tablet 3  . aspirin 81 MG tablet Take 1 tablet (81 mg total) by mouth at bedtime. 30 tablet   . furosemide (LASIX) 20 MG tablet Take 20 mg by mouth daily.    . isosorbide mononitrate (IMDUR) 60 MG 24 hr tablet Take 0.5 tablets (30 mg total) by mouth daily. 45 tablet 0  . losartan (COZAAR) 100 MG tablet Take 50 mg by mouth 2 (two) times daily.    . nitroGLYCERIN (NITROSTAT) 0.4 MG SL tablet Place 0.4 mg under the tongue every 5 (five) minutes x 3 doses as needed for chest pain.    . pravastatin (PRAVACHOL) 20 MG tablet Take 1 tablet by mouth  daily 90 tablet 1  . warfarin (COUMADIN) 5 MG tablet Take 1 tablet (5 mg total) by mouth daily. 90 tablet 1   No current facility-administered medications for this visit.    Allergies:   Review of patient's allergies indicates no known allergies.   Past Medical History  Diagnosis Date  . HTN (hypertension)   . HLD (hyperlipidemia)   . Edema   . Benign neoplasm of colon   . Dyspnea on exertion   . Prostate cancer   . Osteopenia   . Emphysema   . Bronchitis   . Obesity   . Malaise and fatigue   . CAD (coronary artery disease) Dec 2011    s/p CABG per Dr. Roxy Manns; had normal EF; All SVGs occluded per follow up cath with only LIMA to LAD patent; s/p PCI of the proximal and mid LAD February 2014 per  Dr. Burt Knack    Past Surgical History  Procedure Laterality Date  . Median sternotomy  08/23/10  . Coronary artery bypass graft  08/23/10  . Cervical disc surgery    . Cardiac catheterization  10/14/12  . Coronary stent placement  10/14/12    with stent to proximal and mid LAD  . Left heart catheterization with coronary/graft angiogram N/A 09/01/2012    Procedure: LEFT HEART CATHETERIZATION WITH [name];  Surgeon: [name], MD;  Location: [facility] CATH LAB;  Service: Cardiovascular;  Laterality: N/A;  . Percutaneous coronary stent intervention (pci-s) N/A 10/14/2012    Procedure: PERCUTANEOUS CORONARY STENT INTERVENTION (PCI-S);  Surgeon: [name], MD;  Location: [facility] CATH LAB;  Service: Cardiovascular;  Laterality: N/A;     Social History:  The patient  reports that he has quit smoking. He does not have any smokeless tobacco history on file. He reports that he does not drink alcohol or use illicit drugs.   Family History:  The patient's family history includes Heart attack in his father; Heart disease in his brother; Kidney cancer in  his father; Prostate cancer in his brother; Skin cancer in his brother.    ROS:  Please see the history of present illness. All other systems are reviewed and  Negative to the above problem except as noted.    PHYSICAL EXAM: VS:  BP 132/62 mmHg  Pulse 52  Ht '5\' 10"'$  (1.778 m)  Wt 273 lb (123.832 kg)  BMI 39.17 kg/m2  SpO2 98%  GEN: Well nourished, well developed, in no acute distress HEENT: normal Neck: no JVD, carotid bruits, or masses Cardiac: RRR; no murmurs, rubs, or gallops,no edema  Respiratory:  clear to auscultation bilaterally, normal work of breathing GI: soft, nontender, nondistended, + BS  No hepatomegaly  MS: no deformity Moving all extremities   Skin: warm and dry, no rash Neuro:  Strength and sensation are intact Psych: euthymic mood, full affect   EKG:  EKG is not ordered today.   Lipid Panel    Component  Value Date/Time   CHOL 113 06/01/2014 0930   TRIG 115.0 06/01/2014 0930   HDL 32.40* 06/01/2014 0930   CHOLHDL 3 06/01/2014 0930   VLDL 23.0 06/01/2014 0930   LDLCALC 58 06/01/2014 0930   LDLDIRECT 70.7 06/10/2013 1344      Wt Readings from Last 3 Encounters:  05/05/15 273 lb (123.832 kg)  03/31/15 271 lb (122.925 kg)  03/02/15 267 lb (121.11 kg)      ASSESSMENT AND PLAN:  1  Atrial flutter.  Keep on same regimen including coumadin    2   HTN  BP is up in early am but better later.  I am reluctant tp press further   3.  HL   Keep on pravastatin.    4.  CAD I am not cinvinced of active ischemia  5.  Dyspnea  No wheezing on exam  Volume status looks good    I wonder if releated to wt  Encouraged a diet plan     Signed, [name], MD  05/05/2015 2:36 PM    [facility] [place], [place], [place]  [address] Phone: [phone]; Fax: [phone]

## 2015-05-05 NOTE — Patient Instructions (Signed)
Your physician recommends that you continue on your current medications as directed. Please refer to the Current Medication list given to you today. Your physician wants you to follow-up in: 6 months with Dr. Ross.  You will receive a reminder letter in the mail two months in advance. If you don't receive a letter, please call our office to schedule the follow-up appointment.  

## 2015-05-15 DIAGNOSIS — I10 Essential (primary) hypertension: Secondary | ICD-10-CM | POA: Diagnosis not present

## 2015-05-15 DIAGNOSIS — R600 Localized edema: Secondary | ICD-10-CM | POA: Diagnosis not present

## 2015-05-22 DIAGNOSIS — I10 Essential (primary) hypertension: Secondary | ICD-10-CM | POA: Diagnosis not present

## 2015-05-22 DIAGNOSIS — C44319 Basal cell carcinoma of skin of other parts of face: Secondary | ICD-10-CM | POA: Diagnosis not present

## 2015-05-22 DIAGNOSIS — D485 Neoplasm of uncertain behavior of skin: Secondary | ICD-10-CM | POA: Diagnosis not present

## 2015-05-22 DIAGNOSIS — L570 Actinic keratosis: Secondary | ICD-10-CM | POA: Diagnosis not present

## 2015-05-22 DIAGNOSIS — R0600 Dyspnea, unspecified: Secondary | ICD-10-CM | POA: Diagnosis not present

## 2015-05-22 DIAGNOSIS — J0110 Acute frontal sinusitis, unspecified: Secondary | ICD-10-CM | POA: Diagnosis not present

## 2015-05-22 DIAGNOSIS — R600 Localized edema: Secondary | ICD-10-CM | POA: Diagnosis not present

## 2015-05-22 DIAGNOSIS — Z85828 Personal history of other malignant neoplasm of skin: Secondary | ICD-10-CM | POA: Diagnosis not present

## 2015-05-26 ENCOUNTER — Ambulatory Visit (INDEPENDENT_AMBULATORY_CARE_PROVIDER_SITE_OTHER): Payer: Medicare Other | Admitting: Pharmacist

## 2015-05-26 ENCOUNTER — Other Ambulatory Visit: Payer: Self-pay | Admitting: Internal Medicine

## 2015-05-26 DIAGNOSIS — I4891 Unspecified atrial fibrillation: Secondary | ICD-10-CM

## 2015-05-26 LAB — POCT INR: INR: 2.8

## 2015-06-05 DIAGNOSIS — Z23 Encounter for immunization: Secondary | ICD-10-CM | POA: Diagnosis not present

## 2015-06-05 DIAGNOSIS — I10 Essential (primary) hypertension: Secondary | ICD-10-CM | POA: Diagnosis not present

## 2015-06-05 DIAGNOSIS — C61 Malignant neoplasm of prostate: Secondary | ICD-10-CM | POA: Diagnosis not present

## 2015-06-05 DIAGNOSIS — R600 Localized edema: Secondary | ICD-10-CM | POA: Diagnosis not present

## 2015-06-05 DIAGNOSIS — I2510 Atherosclerotic heart disease of native coronary artery without angina pectoris: Secondary | ICD-10-CM | POA: Diagnosis not present

## 2015-06-12 DIAGNOSIS — C61 Malignant neoplasm of prostate: Secondary | ICD-10-CM | POA: Diagnosis not present

## 2015-06-16 ENCOUNTER — Other Ambulatory Visit: Payer: Self-pay | Admitting: Internal Medicine

## 2015-06-28 ENCOUNTER — Ambulatory Visit (INDEPENDENT_AMBULATORY_CARE_PROVIDER_SITE_OTHER): Payer: Medicare Other | Admitting: *Deleted

## 2015-06-28 DIAGNOSIS — I4891 Unspecified atrial fibrillation: Secondary | ICD-10-CM

## 2015-06-28 LAB — POCT INR: INR: 2.5

## 2015-06-29 ENCOUNTER — Telehealth: Payer: Self-pay | Admitting: *Deleted

## 2015-06-29 NOTE — Telephone Encounter (Signed)
Have left 2 messages for pt to call coumadin clinic regarding message this nurse received from [place] at Dr Chales Abrahams office today. Elmyra Ricks states that Dr Coletta Memos wants pt to continue to take ASA 81 mg daily  Pt just returned call to clinic and he was informed to continue to take ASA '81mg'$  daily and he states understanding

## 2015-07-21 ENCOUNTER — Ambulatory Visit (INDEPENDENT_AMBULATORY_CARE_PROVIDER_SITE_OTHER): Payer: Medicare Other | Admitting: *Deleted

## 2015-07-21 DIAGNOSIS — I4891 Unspecified atrial fibrillation: Secondary | ICD-10-CM | POA: Diagnosis not present

## 2015-07-21 LAB — POCT INR: INR: 1.5

## 2015-07-26 NOTE — Telephone Encounter (Signed)
Patient has 90 day supply/w refills on all his meds starting 06/16/15. No RX sent.

## 2015-08-04 ENCOUNTER — Ambulatory Visit (INDEPENDENT_AMBULATORY_CARE_PROVIDER_SITE_OTHER): Payer: Medicare Other | Admitting: *Deleted

## 2015-08-04 DIAGNOSIS — I4891 Unspecified atrial fibrillation: Secondary | ICD-10-CM

## 2015-08-04 LAB — POCT INR: INR: 2.5

## 2015-08-07 DIAGNOSIS — I10 Essential (primary) hypertension: Secondary | ICD-10-CM | POA: Diagnosis not present

## 2015-08-12 ENCOUNTER — Other Ambulatory Visit: Payer: Self-pay | Admitting: Internal Medicine

## 2015-08-14 DIAGNOSIS — J3089 Other allergic rhinitis: Secondary | ICD-10-CM | POA: Diagnosis not present

## 2015-08-14 DIAGNOSIS — R0600 Dyspnea, unspecified: Secondary | ICD-10-CM | POA: Diagnosis not present

## 2015-08-14 DIAGNOSIS — R600 Localized edema: Secondary | ICD-10-CM | POA: Diagnosis not present

## 2015-08-14 DIAGNOSIS — R0689 Other abnormalities of breathing: Secondary | ICD-10-CM | POA: Diagnosis not present

## 2015-08-14 DIAGNOSIS — R0989 Other specified symptoms and signs involving the circulatory and respiratory systems: Secondary | ICD-10-CM | POA: Diagnosis not present

## 2015-08-14 DIAGNOSIS — I2510 Atherosclerotic heart disease of native coronary artery without angina pectoris: Secondary | ICD-10-CM | POA: Diagnosis not present

## 2015-08-14 DIAGNOSIS — R0602 Shortness of breath: Secondary | ICD-10-CM | POA: Diagnosis not present

## 2015-08-14 DIAGNOSIS — I10 Essential (primary) hypertension: Secondary | ICD-10-CM | POA: Diagnosis not present

## 2015-08-14 DIAGNOSIS — Z981 Arthrodesis status: Secondary | ICD-10-CM | POA: Diagnosis not present

## 2015-08-14 DIAGNOSIS — Z0000 Encounter for general adult medical examination without abnormal findings: Secondary | ICD-10-CM | POA: Diagnosis not present

## 2015-08-14 DIAGNOSIS — I700 Atherosclerosis of aorta: Secondary | ICD-10-CM | POA: Diagnosis not present

## 2015-08-14 DIAGNOSIS — Z951 Presence of aortocoronary bypass graft: Secondary | ICD-10-CM | POA: Diagnosis not present

## 2015-08-18 ENCOUNTER — Ambulatory Visit (INDEPENDENT_AMBULATORY_CARE_PROVIDER_SITE_OTHER): Payer: Medicare Other | Admitting: *Deleted

## 2015-08-18 DIAGNOSIS — I4891 Unspecified atrial fibrillation: Secondary | ICD-10-CM

## 2015-08-18 LAB — POCT INR: INR: 2.7

## 2015-08-30 ENCOUNTER — Emergency Department (HOSPITAL_COMMUNITY)
Admission: EM | Admit: 2015-08-30 | Discharge: 2015-08-30 | Disposition: A | Discharge status: Home / Self Care | Payer: Medicare Other | Attending: Emergency Medicine | Admitting: Emergency Medicine

## 2015-08-30 ENCOUNTER — Telehealth: Payer: Self-pay | Admitting: Internal Medicine

## 2015-08-30 ENCOUNTER — Encounter (HOSPITAL_COMMUNITY): Payer: Self-pay | Admitting: Emergency Medicine

## 2015-08-30 ENCOUNTER — Emergency Department (HOSPITAL_COMMUNITY): Payer: Medicare Other

## 2015-08-30 ENCOUNTER — Other Ambulatory Visit: Payer: Self-pay | Admitting: Physician Assistant

## 2015-08-30 DIAGNOSIS — Z951 Presence of aortocoronary bypass graft: Secondary | ICD-10-CM | POA: Insufficient documentation

## 2015-08-30 DIAGNOSIS — Z7901 Long term (current) use of anticoagulants: Secondary | ICD-10-CM | POA: Diagnosis not present

## 2015-08-30 DIAGNOSIS — Z8546 Personal history of malignant neoplasm of prostate: Secondary | ICD-10-CM | POA: Diagnosis not present

## 2015-08-30 DIAGNOSIS — E785 Hyperlipidemia, unspecified: Secondary | ICD-10-CM | POA: Insufficient documentation

## 2015-08-30 DIAGNOSIS — Z8709 Personal history of other diseases of the respiratory system: Secondary | ICD-10-CM | POA: Diagnosis not present

## 2015-08-30 DIAGNOSIS — Z87891 Personal history of nicotine dependence: Secondary | ICD-10-CM | POA: Insufficient documentation

## 2015-08-30 DIAGNOSIS — I2510 Atherosclerotic heart disease of native coronary artery without angina pectoris: Secondary | ICD-10-CM | POA: Insufficient documentation

## 2015-08-30 DIAGNOSIS — R0602 Shortness of breath: Secondary | ICD-10-CM | POA: Diagnosis not present

## 2015-08-30 DIAGNOSIS — Z7982 Long term (current) use of aspirin: Secondary | ICD-10-CM | POA: Insufficient documentation

## 2015-08-30 DIAGNOSIS — E669 Obesity, unspecified: Secondary | ICD-10-CM | POA: Diagnosis not present

## 2015-08-30 DIAGNOSIS — I10 Essential (primary) hypertension: Secondary | ICD-10-CM | POA: Insufficient documentation

## 2015-08-30 DIAGNOSIS — R0609 Other forms of dyspnea: Secondary | ICD-10-CM

## 2015-08-30 DIAGNOSIS — Z79899 Other long term (current) drug therapy: Secondary | ICD-10-CM | POA: Insufficient documentation

## 2015-08-30 DIAGNOSIS — R600 Localized edema: Secondary | ICD-10-CM | POA: Insufficient documentation

## 2015-08-30 DIAGNOSIS — Z86018 Personal history of other benign neoplasm: Secondary | ICD-10-CM | POA: Diagnosis not present

## 2015-08-30 DIAGNOSIS — R0989 Other specified symptoms and signs involving the circulatory and respiratory systems: Secondary | ICD-10-CM

## 2015-08-30 DIAGNOSIS — R079 Chest pain, unspecified: Secondary | ICD-10-CM | POA: Insufficient documentation

## 2015-08-30 DIAGNOSIS — R0600 Dyspnea, unspecified: Secondary | ICD-10-CM

## 2015-08-30 LAB — BASIC METABOLIC PANEL
Anion gap: 7 (ref 5–15)
BUN: 16 mg/dL (ref 6–20)
CO2: 27 mmol/L (ref 22–32)
Calcium: 8.8 mg/dL — ABNORMAL LOW (ref 8.9–10.3)
Chloride: 106 mmol/L (ref 101–111)
Creatinine, Ser: 1.18 mg/dL (ref 0.61–1.24)
GFR calc Af Amer: >60 mL/min (ref >60)
GFR calc non Af Amer: 55 mL/min — ABNORMAL LOW (ref >60)
Glucose, Bld: 98 mg/dL (ref 65–99)
Potassium: 4.5 mmol/L (ref 3.5–5.1)
Sodium: 140 mmol/L (ref 135–145)

## 2015-08-30 LAB — CBC
HCT: 43.6 % (ref 39.0–52.0)
Hemoglobin: 14.1 g/dL (ref 13.0–17.0)
MCH: 29.7 pg (ref 26.0–34.0)
MCHC: 32.3 g/dL (ref 30.0–36.0)
MCV: 92 fL (ref 78.0–100.0)
Platelets: 194 10*3/uL (ref 150–400)
RBC: 4.74 MIL/uL (ref 4.22–5.81)
RDW: 13.5 % (ref 11.5–15.5)
WBC: 7.9 10*3/uL (ref 4.0–10.5)

## 2015-08-30 LAB — I-STAT TROPONIN, ED: Troponin i, poc: 0.01 ng/mL (ref 0.00–0.08)

## 2015-08-30 LAB — PROTIME-INR
INR: 1.12 (ref 0.00–1.49)
Prothrombin Time: 14.6 seconds (ref 11.6–15.2)

## 2015-08-30 NOTE — ED Notes (Signed)
Walked patient around poc a patient did real oxygen stayed at 93 room air walking and heart rate at 60 l\sitting down oxygen level stayed at 97 98

## 2015-08-30 NOTE — Telephone Encounter (Signed)
SPOKE WITH PT'S WIFE  AND  PT IN GREAT LENGTH. PT  COMPLAINING OF SOB  "GASPING  FOR BREATH THIS IS SCARY " WORSENED  OVER   THE  LAST  2-3  DAYS   ALSO NOTES   HEART RATE  YESTERDAY OF  40  AND  41  TODAY    B/P  RUNNING SOMEWHAT  HIGH   181/70 WEIGHT  STABLE AND NO   EDEMA NOTED  . PT  HAD  VISIT  AT  PMD   2 WEEKS  AGO  AND  HAD  CXR  THAT  WAS  GOOD  NO  FLUID PER  WIFE PMD  INCREASED FUROSEMIDE   TO   40 MG  EVERY DAY .PT HAD  ALSO TAKEN  NTG   COUPLE DAYS  AGO  WITH  NO RELIEF .MYOVIEW  THAT WAS DONE IN [place]  SUGGESTED NO ISCHEMIA . [place]  .PER  DR Caryl Comes PT  NEEDS TO  GO TO  ED  FOR EVAL  AND  TREATMENT  POSSIBLY NEEDS  CATH . PHONED  PT   AND   INFORMED  OF  RECOMMENDATIONS  AND  WILL  GO AFTER  GETS  DRESSED  AND  SOMETHING  TO EAT

## 2015-08-30 NOTE — ED Provider Notes (Signed)
CSN: [id number]     Arrival date & time 08/30/15  1109 History   First MD Initiated Contact with Patient 08/30/15 1316     Chief Complaint  Patient presents with  . Shortness of Breath  . Chest Pain     (Consider location/radiation/quality/duration/timing/severity/associated sxs/prior Treatment) HPI   81 year old male with exertional dyspnea. This has been progressively worsening over the past several weeks to months. Has got to the point where he cannot bathe himself or walk across the room without feeling short of breath. Up until today he hasn't had any chest pain. Today he reports 3 episodes of very brief, sharp pain in the left anterior chest. Currently denies. Significant PMHx including CAD, afib/flutter and HTN. Some lower edema, but denies any acute change. No fever or chills. No cough. Reports compliance with medications. Says called cardiology office today and advised to come to ED.   Past Medical History  Diagnosis Date  . HTN (hypertension)   . HLD (hyperlipidemia)   . Edema   . Benign neoplasm of colon   . Dyspnea on exertion   . Prostate cancer ([place])   . Osteopenia   . Emphysema   . Bronchitis   . Obesity   . Malaise and fatigue   . CAD (coronary artery disease) Dec 2011    s/p CABG per Dr. Roxy Manns; had normal EF; All SVGs occluded per follow up cath with only LIMA to LAD patent; s/p PCI of the proximal and mid LAD February 2014 per Dr. Burt Knack   Past Surgical History  Procedure Laterality Date  . Median sternotomy  08/23/10  . Coronary artery bypass graft  08/23/10  . Cervical disc surgery    . Cardiac catheterization  10/14/12  . Coronary stent placement  10/14/12    with stent to proximal and mid LAD  . Left heart catheterization with coronary/graft angiogram N/A 09/01/2012    Procedure: LEFT HEART CATHETERIZATION WITH [name];  Surgeon: [name], MD;  Location: [facility] CATH LAB;  Service: Cardiovascular;  Laterality: N/A;  . Percutaneous  coronary stent intervention (pci-s) N/A 10/14/2012    Procedure: PERCUTANEOUS CORONARY STENT INTERVENTION (PCI-S);  Surgeon: [name], MD;  Location: [facility] CATH LAB;  Service: Cardiovascular;  Laterality: N/A;   Family History  Problem Relation Age of Onset  . Heart disease Brother   . Skin cancer Brother   . Prostate cancer Brother   . Kidney cancer Father   . Heart attack Father    Social History  Substance Use Topics  . Smoking status: Former Research scientist (life sciences)  . Smokeless tobacco: None     Comment: reported that he chews tobacco 11/10/12  . Alcohol Use: No    Review of Systems  All systems reviewed and negative, other than as noted in HPI.   Allergies  Review of patient's allergies indicates no known allergies.  Home Medications   Prior to Admission medications   Medication Sig Start Date End Date Taking? Authorizing Provider  amLODipine (NORVASC) 2.5 MG tablet Take 1 tablet (2.5 mg total) by mouth 2 (two) times daily. 04/07/15   [name], MD  aspirin 81 MG tablet Take 1 tablet (81 mg total) by mouth at bedtime. 01/06/15   [name], MD  furosemide (LASIX) 20 MG tablet Take 1 tablet by mouth  every day for 6 days, then  2 tablets for 1 day 06/16/15   [name], MD  isosorbide mononitrate (IMDUR) 60 MG 24 hr tablet Take one-half  tablet by  mouth daily 08/14/15   [name], MD  losartan (COZAAR) 100 MG tablet Take 50 mg by mouth 2 (two) times daily.    Historical Provider, MD  nitroGLYCERIN (NITROSTAT) 0.4 MG SL tablet Place 0.4 mg under the tongue every 5 (five) minutes x 3 doses as needed for chest pain. 08/09/13   [name], MD  pravastatin (PRAVACHOL) 20 MG tablet Take 1 tablet by mouth  daily 06/16/15   [name], MD  spironolactone (ALDACTONE) 25 MG tablet Take 25 mg by mouth daily.    Historical Provider, MD  warfarin (COUMADIN) 5 MG tablet Take 1 tablet by mouth  daily 06/16/15   [name], MD   BP 174/83 mmHg  Pulse 50  Temp(Src) 97.8 F (36.6 C) (Oral)   Resp 18  Ht '5\' 10"'$  (1.778 m)  Wt 267 lb (121.11 kg)  BMI 38.31 kg/m2  SpO2 95% Physical Exam  Constitutional: He appears well-developed and well-nourished. No distress.  HENT:  Head: Normocephalic and atraumatic.  Eyes: Conjunctivae are normal. Right eye exhibits no discharge. Left eye exhibits no discharge.  Neck: Neck supple.  Cardiovascular: Normal rate, regular rhythm and normal heart sounds.  Exam reveals no gallop and no friction rub.   No murmur heard. Pulmonary/Chest: Effort normal and breath sounds normal. No respiratory distress.  Abdominal: Soft. He exhibits no distension. There is no tenderness.  Musculoskeletal: He exhibits edema. He exhibits no tenderness.  Mild symmetric LE edema  Neurological: He is alert.  Skin: Skin is warm and dry.  Psychiatric: He has a normal mood and affect. His behavior is normal. Thought content normal.  Nursing note and vitals reviewed.   ED Course  Procedures (including critical care time) Labs Review Labs Reviewed  BASIC METABOLIC PANEL - Abnormal; Notable for the following:    Calcium 8.8 (*)    GFR calc non Af Amer 55 (*)    All other components within normal limits  CBC  PROTIME-INR  I-STAT TROPOININ, ED    Imaging Review Dg Chest 2 View  08/30/2015  CLINICAL DATA:  Left-sided chest pain, shortness of breath EXAM: CHEST  2 VIEW COMPARISON:  08/14/2015 FINDINGS: Lungs are clear.  No pleural effusion or pneumothorax. The heart is top-normal in size/ mildly enlarged. Postsurgical changes related to prior CABG. Mild degenerative changes of the visualized thoracolumbar spine. Cervical spine fixation hardware. IMPRESSION: No evidence of acute cardiopulmonary disease. Electronically Signed   By: Julian Hy M.D.   On: 08/30/2015 12:25   I have personally reviewed and evaluated these images and lab results as part of my medical decision-making.   EKG Interpretation   Date/Time:  Wednesday August 30 2015 11:47:30  EST Ventricular Rate:  52 PR Interval:  262 QRS Duration: 156 QT Interval:  512 QTC Calculation: 476 R Axis:   -42 Text Interpretation:  Sinus bradycardia with 1st degree A-V block Left  axis deviation Right bundle branch block Left ventricular hypertrophy  Abnormal ECG Confirmed by Wilson Singer  MD, Sherrise Liberto ([id number]) on 08/30/2015 1:23:58  PM      MDM   Final diagnoses:  Exertional dyspnea    81 year old male with progressively worsening exertional dyspnea. Concerned may be cardiac in nature. Doubt infectious. Doubt pulmonary embolism. Patient is on warfarin. His INR today is normal. He reports symptoms for the past several weeks though including when he had an INR in the therapeutic range. Clinically he is not significantly fluid overloaded. Reports chest pain today  but this sounds atypical. Very brief, sharp pain lasting a few seconds. ED workup fairly unremarkable.  Evaluated by cardiology. Does not feel dyspnea is cardiac related. I agree that seems more chronic in nature although worsening. Doubt emergent process though. I feel can be further w/u as outpt.     Virgel Manifold, MD 09/08/15 1215

## 2015-08-30 NOTE — Discharge Instructions (Signed)

## 2015-08-30 NOTE — Telephone Encounter (Signed)
New Message  Pt c/o Shortness Of Breath: STAT if SOB developed within the last 24 hours or pt is noticeably SOB on the phone  1. Are you currently SOB (can you hear that pt is SOB on the phone)? Not right this minute  2. How long have you been experiencing SOB? Gotten worse within the last 2 weeks  3. Are you SOB when sitting or when up moving around? Moving around 4.  Are you currently experiencing any other symptoms? No other symptom.   Comments: Last night he was gasping for breath as he moves around.

## 2015-08-30 NOTE — Consult Note (Signed)
CARDIOLOGY CONSULT NOTE   Patient ID: Levi [name] MRN: [id number] DOB/AGE: [birth year] 81 y.o.  Admit date: 08/30/2015  Primary Physician   [name], MD Primary Cardiologist   Dr. Harrington Challenger Reason for Consultation   Dyspnea on exertion Requesting Physician Dr. Wilson Singer  HPI: Levi [name] is a 81 y.o. male with a history of CAD s/p CABG x 4 (2011), HTN, HL, paroxysmal A. fib/atrial flutter,  prostate cancer, and chronic dyspnea on exertion who came to [facility] ED 08/30/2015 for evaluation of worsening SOB and L sided sharp pain.   Hx of CABG s/p Successful PCI of severe stenosis in the proximal and mid-LAD 10/2012. The patient has chronic shortness of breath for the past 3-4 years. However,  worsened in the last 1 month. He can only walks a few steps and gets shortness of breath. He also admits to have sharp left-sided chest pain underneath his breast x 3, worse morning that lead him to ED presentation. The patient also had a nose and upper respiratory infection symptoms for the past few weeks. Recently seen by his PCP in approximately 2 months ago and had a normal chest x-ray. Patient is a former smoker (smoke 1ppd for 30-40 years) who quit approximately 30 years ago. The patient denies any recent trauma or injury or fall. The patient denies nausea, vomiting, fever, palpitations,  orthopnea, PND, dizziness, syncope, cough, congestion, abdominal pain, hematochezia, melena, lower extremity edema. Nothing makes his pain worse or better.  In ED, POC troponin 0.01. CXR without acute abnormality. EKG showed sinus rhythm with 1st degree AV block and RBBB. No acute change.     Past Medical History  Diagnosis Date  . HTN (hypertension)   . HLD (hyperlipidemia)   . Edema   . Benign neoplasm of colon   . Dyspnea on exertion   . Prostate cancer ([place])   . Osteopenia   . Emphysema   . Bronchitis   . Obesity   . Malaise and fatigue   . CAD (coronary artery disease) Dec 2011    s/p CABG per Dr.  Roxy Manns; had normal EF; All SVGs occluded per follow up cath with only LIMA to LAD patent; s/p PCI of the proximal and mid LAD February 2014 per Dr. Burt Knack     Past Surgical History  Procedure Laterality Date  . Median sternotomy  08/23/10  . Coronary artery bypass graft  08/23/10  . Cervical disc surgery    . Cardiac catheterization  10/14/12  . Coronary stent placement  10/14/12    with stent to proximal and mid LAD  . Left heart catheterization with coronary/graft angiogram N/A 09/01/2012    Procedure: LEFT HEART CATHETERIZATION WITH [name];  Surgeon: [name], MD;  Location[facility] C[facility] LAB;  Service: Cardiovascular;  Laterality: N/A;  . Percutaneous coronary stent intervention (pci-s) N/A 10/14/2012    Procedure: PERCUTANEOUS CORONARY STENT INTERVENTION (PCI-S);  Surgeon: [name], MD;  Location: [facility] CATH LAB;  Service: Cardiovascular;  Laterality: N/A;    No Known Allergies  I have reviewed the patient's current medications     Prior to Admission medications   Medication Sig Start Date End Date Taking? Authorizing Provider  amLODipine (NORVASC) 2.5 MG tablet Take 1 tablet (2.5 mg total) by mouth 2 (two) times daily. 04/07/15   [name], MD  aspirin 81 MG tablet Take 1 tablet (81 mg total) by mouth at bedtime. 01/06/15   [name], MD  furosemide (LASIX) 20 MG  tablet Take 1 tablet by mouth  every day for 6 days, then  2 tablets for 1 day 06/16/15   [name], MD  isosorbide mononitrate (IMDUR) 60 MG 24 hr tablet Take one-half tablet by  mouth daily 08/14/15   [name], MD  losartan (COZAAR) 100 MG tablet Take 50 mg by mouth 2 (two) times daily.    Historical Provider, MD  nitroGLYCERIN (NITROSTAT) 0.4 MG SL tablet Place 0.4 mg under the tongue every 5 (five) minutes x 3 doses as needed for chest pain. 08/09/13   [name], MD  pravastatin (PRAVACHOL) 20 MG tablet Take 1 tablet by mouth  daily 06/16/15   [name], MD  spironolactone (ALDACTONE)  25 MG tablet Take 25 mg by mouth daily.    Historical Provider, MD  warfarin (COUMADIN) 5 MG tablet Take 1 tablet by mouth  daily 06/16/15   [name], MD     Social History   Social History  . Marital Status: Married    Spouse Name: N/A  . Number of Children: N/A  . Years of Education: N/A   Occupational History  . retired Furniture conservator/restorer    Social History Main Topics  . Smoking status: Former Research scientist (life sciences)  . Smokeless tobacco: Not on file     Comment: reported that he chews tobacco 11/10/12  . Alcohol Use: No  . Drug Use: No  . Sexual Activity: Not Currently   Other Topics Concern  . Not on file   Social History Narrative    Family Status  Relation Status Death Age  . Father Deceased   . Mother Deceased    Family History  Problem Relation Age of Onset  . Heart disease Brother   . Skin cancer Brother   . Prostate cancer Brother   . Kidney cancer Father   . Heart attack Father      ROS:  Full 14 point review of systems complete and found to be negative unless listed above.  Physical Exam: Blood pressure 174/83, pulse 50, temperature 97.8 F (36.6 C), temperature source Oral, resp. rate 18, height '5\' 10"'$  (1.778 m), weight 267 lb (121.11 kg), SpO2 95 %.  General: Well developed, well nourished, male in no acute distress Head: Eyes PERRLA, No xanthomas. Normocephalic and atraumatic, oropharynx without edema or exudate.  Lungs: Resp regular and unlabored, CTA. Pain reproducible palpation underneath left breast.  Heart: RR with bradycardia.  no s3, s4, or murmurs..   Neck: No carotid bruits. No lymphadenopathy.  No JVD. Abdomen: Bowel sounds present, abdomen soft.  Hernias over epigastric area. TTP over RUQ.  Msk:  No spine or cva tenderness. No weakness, no joint deformities or effusions. Extremities: No clubbing, cyanosis or edema. DP/PT/Radials 2+ and equal bilaterally. Neuro: Alert and oriented X 3. No focal deficits noted. Psych:  Good affect, responds  appropriately Skin: No rashes or lesions noted.  Labs:   Lab Results  Component Value Date   WBC 7.9 08/30/2015   HGB 14.1 08/30/2015   HCT 43.6 08/30/2015   MCV 92.0 08/30/2015   PLT 194 08/30/2015    Recent Labs  08/30/15 1204  INR 1.12    Recent Labs Lab 08/30/15 1204  NA 140  K 4.5  CL 106  CO2 27  BUN 16  CREATININE 1.18  CALCIUM 8.8*  GLUCOSE 98   MAGNESIUM  Date Value Ref Range Status  08/23/2010 2.5 1.5 - 2.5 mg/dL Final   No results for input(s):  CKTOTAL, CKMB, TROPONINI in the last 72 hours.  Recent Labs  08/30/15 1223  TROPIPOC 0.01    Echo: 07/03/2015 LV EF: 55% -  60%  ------------------------------------------------------------------- Indications:   Syncope 780.2.  ------------------------------------------------------------------- History:  PMH:  Atrial fibrillation. Coronary artery disease. Risk factors: Former tobacco use. Hypertension.  ------------------------------------------------------------------- Study Conclusions  - Left ventricle: The cavity size was normal. Wall thickness was increased in a pattern of mild LVH. Systolic function was normal. The estimated ejection fraction was in the range of 55% to 60%. Wall motion was normal; there were no regional wall motion abnormalities. Left ventricular diastolic function parameters were normal. - Ventricular septum: Septal motion showed paradox. These changes are consistent with intraventricular conduction delay. - Left atrium: The atrium was moderately dilated. - Tricuspid valve: There was mild regurgitation. Diastolic regurgitation was present. - Pulmonary arteries: Systolic pressure was mildly increased. PA peak pressure: 42 mm Hg (S).  ECG:   Vent. rate 52 BPM PR interval 262 ms QRS duration 156 ms QT/QTc 512/476 ms P-R-T axes 55 -42 98  Cath 08/14/2012 Angiographic Findings:   Left main: Distal 60-70% stenosis.   Left Anterior Descending  Artery: The ostium has 40-50% stenosis. The first diagonal branch is occluded. The diagonal branch fills from the LIMA graft. The proximal LAD prior to D1 takeoff has 80% stenosis followed by a small aneurysmal segment. Following the aneurysmal segment, there is a long segment of up to 80% stenosis. The moderate sized septal perforating branch has 99% ostial stenosis. The distal vessel is small with diffuse mild to moderate disease. SVG to LAD is known to be occluded at the ostium.   Circumflex Artery: This is a moderate sized vessel with diffuse disease. The proximal vessel has diffuse 50% stenosis. The OM branch has serial 60% lesions. The SVG to OM is known to be occluded.   Right Coronary Artery: Large dominant vessel with 30% proximal stenosis and mild plaque mid and distal. There is a small filling defect in the mid RCA likely representing a calcified nodule of plaque. This does not create hemodynamically significant stenosis. The posterolateral branch has 50% stenosis. The PDA is small and diffusely diseased. SVG to PDA is known to be occluded.   Left Ventricular Angiogram: LVEF 55%, no regional wall motion abnormalities .  Impression:  1. Severe triple vessel CAD s/p 4 V CABG with only 1 patent bypass graft.  2. Severe disease in native LAD and PDA. Moderate disease in LCx. 3. Preserved LV systolic function.   Recommendations: There is moderate (60%) stenosis in the distal LM and complex severe disease in the proximal LAD around D1. The LAD disease could potentially be treated interventionally but would be difficult. Will start Imdur and ambulate patient. If he remains symptomatic, will consider intervention to LAD. Discussed with Dr. Burt Knack.   Cath 10/14/2012 Lesion Data: Vessel: LAD/prox and mid Percent stenosis (pre): 90 TIMI-flow (pre): 3 Stent: 2.25x32 mm Promus DES and 2.5x20 mm Promus DES Percent stenosis (post): 0 TIMI-flow (post): 3  Conclusions: Successful PCI  of severe stenosis in the proximal and mid-LAD  Recommendations: ASA/plavix at least 12 months. Complex CAD with residual moderate left main, ostial LAD, and proximal LCx stenoses. Any further intervention would require extremely complex stenting of the distal left main and I would recommend medical therapy as first-line approach for treatment of residual CAD. He has failed medical therapy up to now. Despite max meds (imdur, ranolazine, metoprolol), he continued to have severe symptoms. I did not  perform FFR because I felt the calcification and severe diffuse disease would lead to a high-risk of wire dissection.  Myoview 11/09/2014 No ischemia  Radiology:  Dg Chest 2 View  08/30/2015  CLINICAL DATA:  Left-sided chest pain, shortness of breath EXAM: CHEST  2 VIEW COMPARISON:  08/14/2015 FINDINGS: Lungs are clear.  No pleural effusion or pneumothorax. The heart is top-normal in size/ mildly enlarged. Postsurgical changes related to prior CABG. Mild degenerative changes of the visualized thoracolumbar spine. Cervical spine fixation hardware. IMPRESSION: No evidence of acute cardiopulmonary disease. Electronically Signed   By: Julian Hy M.D.   On: 08/30/2015 12:25    ASSESSMENT AND PLAN:    1. Chest pain - Very atypical, described as sharp. The reproducible with palpation underneath left breast. Pain reproducible with deep breath as well on exam. He is also tender to palpation over right upper quadrant - abdominal.  - Suspect musculoskeletal versus costochondritis. He also has a epigastric hernia which nontender to palpation.  2. Dyspnea or exertion - Worsening over the past month. He is also recovering from URI. - Myoview 11/29/2014 without evidence of ischemia.  - Echocardiogram 07/02/2014 shows left ventricular function of 55-60%, mild LVH, no wall motion abnormality, moderately dilated left atrium, pulmonary peak pressure of 42 mmHg, mild tricuspid regurgitation, Ventricular septum showed  paradox with intraventricular conduction delay.  3. CAD - S/p CABG x 4 2011. - Successful PCI of severe stenosis in the proximal and mid-LAD 10/2012.  4. Paroxsymal d A. fib/A flutter - Maintaining sinus rhythm. CHADSVASCs score of 4. Continue coumadin.    SignedLeanor Kail, PA 08/30/2015, 2:13 PM Pager [phone]  Co-Sign MD   History and all data above reviewed.  Patient examined.  I agree with the findings as above.  The patient presented to the ED with progressive dyspnea.  After careful discussion it is clear that this is a chronic dyspnea that has been progressive over two years.  He has not had any acute exacerbation.  He has echo last year and Lexiscan Myoview this year without clear cardiac etiology.  He has not had pulmonary work up.  He did have some fleeting sharp apical chest pain this morning.  He has no cough, fever or chills.  He has had no weight gain or edema.  The patient exam reveals COR:RRR  ,  Lungs: Bilateral fine basilar crackles  ,  Abd: Positive bowel sounds, no rebound no guarding, Ext No edema  .  All available labs, radiology testing, previous records reviewed. Agree with documented assessment and plan. Dyspnea:  This has been chronic and slowly progressive.  He does have some dry crackles on exam.  At this point I don't see a reason for in patient cardiac work up.  I don't suspect new ischemia.  It is unlikely that his bradycardia is contributing.  However, this can be considered if pulmonary work up is negative.  We will arrange a pulmonary appt.  Of note he has carotid bruits and we will arrange a Doppler.    Jeneen Rinks Chayton Murata  2:56 PM  08/30/2015

## 2015-08-30 NOTE — ED Notes (Signed)
Pt sent from dr's office, has increasing shortness of breath and chest pain. Pain under left rib.

## 2015-09-05 ENCOUNTER — Telehealth: Payer: Self-pay | Admitting: Internal Medicine

## 2015-09-05 NOTE — Telephone Encounter (Signed)
New problem    Pt want nurse to get him a sooner appt w/Dr Ashok Cordia. Please call pt.

## 2015-09-05 NOTE — Telephone Encounter (Signed)
Spoke with patient who states he wanted to see Dr. Ashok Cordia, pulmonary.  He has an appointment Thursday with Dr. Melvyn Novas which he states is the earliest appointment they have available.  I advised him that Dr. Harrington Challenger is out of the office today.  I advised that after reviewing his ED visit from last week, reading note from Dr. Percival Spanish and with holiday schedule, that Thursday appointment should be sufficient for him.  I advised him to call back with specific questions or concerns.  He verbalized understanding and agreement and thanked me for the call.

## 2015-09-06 ENCOUNTER — Ambulatory Visit (INDEPENDENT_AMBULATORY_CARE_PROVIDER_SITE_OTHER): Payer: Medicare Other | Admitting: *Deleted

## 2015-09-06 DIAGNOSIS — I4891 Unspecified atrial fibrillation: Secondary | ICD-10-CM

## 2015-09-06 LAB — POCT INR: INR: 1.3

## 2015-09-07 ENCOUNTER — Encounter: Payer: Self-pay | Admitting: Internal Medicine

## 2015-09-07 ENCOUNTER — Telehealth: Payer: Self-pay | Admitting: Internal Medicine

## 2015-09-07 ENCOUNTER — Other Ambulatory Visit (INDEPENDENT_AMBULATORY_CARE_PROVIDER_SITE_OTHER): Payer: Medicare Other

## 2015-09-07 ENCOUNTER — Ambulatory Visit (INDEPENDENT_AMBULATORY_CARE_PROVIDER_SITE_OTHER): Payer: Medicare Other | Admitting: Internal Medicine

## 2015-09-07 VITALS — BP 134/88 | HR 55 | Ht 70.0 in | Wt 273.0 lb

## 2015-09-07 DIAGNOSIS — R0600 Dyspnea, unspecified: Secondary | ICD-10-CM

## 2015-09-07 LAB — D-DIMER, QUANTITATIVE: D-Dimer, Quant: 0.49 ug/mL-FEU — ABNORMAL HIGH (ref 0.00–0.48)

## 2015-09-07 LAB — BASIC METABOLIC PANEL
BUN: 17 mg/dL (ref 6–23)
CHLORIDE: 104 meq/L (ref 96–112)
CO2: 28 meq/L (ref 19–32)
CREATININE: 1.08 mg/dL (ref 0.40–1.50)
Calcium: 9.2 mg/dL (ref 8.4–10.5)
GFR: 69.15 mL/min (ref >60.00)
GLUCOSE: 93 mg/dL (ref 70–99)
Potassium: 4.4 mEq/L (ref 3.5–5.1)
Sodium: 138 mEq/L (ref 135–145)

## 2015-09-07 LAB — CBC WITH DIFFERENTIAL/PLATELET
BASOS PCT: 0.5 % (ref 0.0–3.0)
Basophils Absolute: 0 10*3/uL (ref 0.0–0.1)
EOS PCT: 4 % (ref 0.0–5.0)
Eosinophils Absolute: 0.4 10*3/uL (ref 0.0–0.7)
HEMATOCRIT: 45.7 % (ref 39.0–52.0)
HEMOGLOBIN: 15 g/dL (ref 13.0–17.0)
Lymphocytes Relative: 18.3 % (ref 12.0–46.0)
Lymphs Abs: 1.7 10*3/uL (ref 0.7–4.0)
MCHC: 32.8 g/dL (ref 30.0–36.0)
MCV: 90.6 fl (ref 78.0–100.0)
MONO ABS: 0.8 10*3/uL (ref 0.1–1.0)
Monocytes Relative: 9 % (ref 3.0–12.0)
NEUTROS ABS: 6.4 10*3/uL (ref 1.4–7.7)
Neutrophils Relative %: 68.2 % (ref 43.0–77.0)
PLATELETS: 208 10*3/uL (ref 150.0–400.0)
RBC: 5.04 Mil/uL (ref 4.22–5.81)
RDW: 13.8 % (ref 11.5–15.5)
WBC: 9.4 10*3/uL (ref 4.0–10.5)

## 2015-09-07 LAB — BRAIN NATRIURETIC PEPTIDE: Pro B Natriuretic peptide (BNP): 204 pg/mL — ABNORMAL HIGH (ref 0.0–100.0)

## 2015-09-07 LAB — TSH: TSH: 5.95 u[IU]/mL — ABNORMAL HIGH (ref 0.35–4.50)

## 2015-09-07 MED ORDER — FAMOTIDINE 20 MG PO TABS: ORAL_TABLET | ORAL | Status: DC

## 2015-09-07 MED ORDER — PANTOPRAZOLE SODIUM 40 MG PO TBEC: 40.0000 mg | DELAYED_RELEASE_TABLET | Freq: Every day | ORAL | Status: DC

## 2015-09-07 NOTE — Patient Instructions (Addendum)
Pantoprazole (protonix) 40 mg   Take  30-60 min before first meal of the day and Pepcid (famotidine)  20 mg one @  bedtime until return to office - this is the best way to tell whether stomach acid is contributing to your problem.   GERD (REFLUX)  is an extremely common cause of respiratory symptoms just like yours , many times with no obvious heartburn at all.    It can be treated with medication, but also with lifestyle changes including elevation of the head of your bed (ideally with 6 inch  bed blocks),  Smoking cessation, avoidance of late meals, excessive alcohol, and avoid fatty foods, chocolate, peppermint, colas, red wine, and acidic juices such as orange juice.  NO MINT OR MENTHOL PRODUCTS SO NO COUGH DROPS  USE SUGARLESS CANDY INSTEAD (Jolley ranchers or Stover's or Life Savers) or even ice chips will also do - the key is to swallow to prevent all throat clearing. NO OIL BASED VITAMINS - use powdered substitutes.   Please remember to go to the lab  department downstairs for your tests - we will call you with the results when they are available.     Please schedule a follow up office visit in 4 weeks, sooner if needed

## 2015-09-07 NOTE — Progress Notes (Signed)
Subjective:    Patient ID: Levi [name], male    DOB: 05-04-1929,   MRN: [id number]  HPI  30 yowm quit smoking in 1970s chronic sob x 2006 but could still walk a half mile but ever since cabg 2011 feels has gone steadily downhill with breathing even p cardiac rehab never went back to walking and much worse since around late November 2016 so referred to pulmonary clinic 09/10/2015 assoc with voice change.    09/07/2015 1st [facility] office visit/ Phyillis Dascoli   Chief Complaint  Patient presents with  . Advice Only    referred for dyspnea per Dr. Harrington Challenger. pt c/o worsening SOB X5-6 years, worse after bypass surgery on 08/24/2010.   no problem at rest sitting still but noct sinuses get stopped >> Has to sleep recliner  Nasal symptoms worse since breathing got worse  Assoc with sensation of swimmy headed when stand up too quickly   No obvious   day to day or daytime variabilty or assoc chronic cough or cp or chest tightness, subjective wheeze or overt  hb symptoms. No unusual exp hx or h/o childhood pna/ asthma or knowledge of premature birth.  Sleeping ok without nocturnal  or early am exacerbation  of respiratory  c/o's or need for noct saba. Also denies any obvious fluctuation of symptoms with weather or environmental changes or other aggravating or alleviating factors except as outlined above   Current Medications, Allergies, Complete Past Medical History, Past Surgical History, Family History, and Social History were reviewed in [employer] record.             Review of Systems  Constitutional: Negative for fever and unexpected weight change.  HENT: Positive for congestion, postnasal drip, rhinorrhea and sinus pressure. Negative for dental problem, ear pain, nosebleeds, sneezing, sore throat and trouble swallowing.   Eyes: Negative for redness and itching.  Respiratory: Positive for shortness of breath. Negative for cough, chest tightness and wheezing.     Cardiovascular: Negative for palpitations and leg swelling.  Gastrointestinal: Negative for nausea and vomiting.  Genitourinary: Negative for dysuria.  Musculoskeletal: Negative for joint swelling.  Skin: Negative for rash.  Neurological: Negative for headaches.  Hematological: Does not bruise/bleed easily.  Psychiatric/Behavioral: Negative for dysphoric mood. The patient is not nervous/anxious.        Objective:   Physical Exam  amb wm gruff voice/ nad/ mod pseudowheeze    Wt Readings from Last 3 Encounters:  09/07/15 273 lb (123.832 kg)  08/30/15 267 lb (121.11 kg)  05/05/15 273 lb (123.832 kg)    Vital signs reviewed   HEENT: nl dentition, turbinates, and oropharynx. Nl external ear canals without cough reflex   NECK :  without JVD/Nodes/TM/ nl carotid upstrokes bilaterally   LUNGS: no acc muscle use,  Nl contour chest which is clear to A and P bilaterally without cough on insp or exp maneuvers   CV:  RRR  no s3 or murmur or increase in P2,  Trace bilateral lower ext edema   ABD:  soft and nontender with nl inspiratory excursion in the supine position. No bruits or organomegaly, bowel sounds nl  MS:  Nl gait/ ext warm without deformities, calf tenderness, cyanosis or clubbing No obvious joint restrictions   SKIN: warm and dry without lesions    NEURO:  alert, approp, nl sensorium with  no motor deficits      I personally reviewed images and agree with radiology impression as follows:  CXR:  08/30/15 No evidence of acute cardiopulmonary disease.   Labs ordered/ reviewed:   Lab 09/07/15 1158  NA 138  K 4.4  CL 104  CO2 28  BUN 17  CREATININE 1.08  GLUCOSE 93   Lab 09/07/15 1158  HGB 15.0  HCT 45.7  WBC 9.4  PLT 208.0   Lab Results  Component Value Date   TSH 5.95* 09/07/2015     Lab Results  Component Value Date   PROBNP 204.0* 09/07/2015          I personally reviewed images and agree with radiology impression as follows:  CXR:   08/30/15 No evidence of acute cardiopulmonary disease.            Assessment & Plan:

## 2015-09-07 NOTE — Telephone Encounter (Signed)
ATC PT, line rings busy x 3 WCB I have resent in pepcid and protonix to [facility]

## 2015-09-08 LAB — ALLERGY FULL PROFILE
Alternaria Alternata: <0.1 kU/L
Bahia Grass: <0.1 kU/L
Box Elder IgE: <0.1 kU/L
Cat Dander: <0.1 kU/L
Curvularia lunata: <0.1 kU/L
Dog Dander: <0.1 kU/L
Elm IgE: <0.1 kU/L
Fescue: <0.1 kU/L
G005 Rye, Perennial: <0.1 kU/L
G009 Red Top: <0.1 kU/L
Helminthosporium halodes: <0.1 kU/L
House Dust Hollister: <0.1 kU/L
IGE (IMMUNOGLOBULIN E), SERUM: 16 kU/L (ref <115)
Lamb's Quarters: <0.1 kU/L
Oak: <0.1 kU/L
Sycamore Tree: <0.1 kU/L
Timothy Grass: <0.1 kU/L

## 2015-09-08 NOTE — Telephone Encounter (Signed)
ATC pt and line is still ringing busy. WCB

## 2015-09-10 ENCOUNTER — Encounter: Payer: Self-pay | Admitting: Internal Medicine

## 2015-09-10 NOTE — Assessment & Plan Note (Signed)
Complicated by HBP/ Hyperlipidemia  Body mass index is 39.17 kg/(m^2).  Lab Results  Component Value Date   TSH 5.95* 09/07/2015     Contributing to gerd tendency/ doe/reviewed the need and the process to achieve and maintain neg calorie balance > defer f/u primary care including   monitoring thyroid status as may be borderline hypothyroid

## 2015-09-10 NOTE — Assessment & Plan Note (Addendum)
-   09/07/2015  Walked RA x 1 laps @ 185 ft each stopped due to sob/slow pace/ pulse dropped to 40 >  NSR with 1st deg/ LAHB  - Spirometry 09/07/2015  FEV1  2.17 (70%) ratio 85 = mild restriction/ no obst  - allergy profile 09/07/15 >   Eos 0.4,  IgE 10 with  neg RAST    Symptoms are markedly disproportionate to objective findings and not clear this is a lung problem but pt does appear to have difficult airway management issues.  DDX of  difficult airways management almost all start with A and  include Adherence, Ace Inhibitors, Acid Reflux, Active Sinus Disease, Alpha 1 Antitripsin deficiency, Anxiety masquerading as Airways dz,  ABPA,  Allergy(esp in young), Aspiration (esp in elderly), Adverse effects of meds,  Active smokers, A bunch of PE's (a small clot burden can't cause this syndrome unless there is already severe underlying pulm or vascular dz with poor reserve) plus two Bs  = Bronchiectasis and Beta blocker use..and one C= CHF   Adherence is always the initial "prime suspect" and is a multilayered concern that requires a "trust but verify" approach in every patient - starting with knowing how to use medications, especially inhalers, correctly, keeping up with refills and understanding the fundamental difference between maintenance and prns vs those medications only taken for a very short course and then stopped and not refilled.   ? Acid (or non-acid) GERD > always difficult to exclude as up to 75% of pts in some series report no assoc GI/ Heartburn symptoms> rec max (24h)  acid suppression and diet restrictions/ reviewed and instructions given in writing.   ?  A bunch of pe's > not likely on coumadin   ? Cardiac > note drop in pulse rate with walking suggests chronotropic incompetence despite no BB rx (? Related to elevated tsh ? > refer back to Cards for re-eval and to primary care for f/u thyroid fxn  Will see back in 4 weeks to be sure we close the loop on the w/u but longterm pulmonary f/u not  needed

## 2015-09-11 NOTE — Telephone Encounter (Signed)
ATC, line still ringing busy.  [facility], Pharmacist Hilda Blades verified that these rx's have been picked up by pt since we sent in rx's on 09/07/15.  Will close message.

## 2015-09-11 NOTE — Progress Notes (Signed)
Quick Note:  ATC, NA and no option to leave a msg ______ 

## 2015-09-12 NOTE — Progress Notes (Signed)
Quick Note:  Spoke with pt and notified of results per Dr. Wert. Pt verbalized understanding and denied any questions.  ______ 

## 2015-09-13 ENCOUNTER — Ambulatory Visit (INDEPENDENT_AMBULATORY_CARE_PROVIDER_SITE_OTHER): Payer: Medicare Other | Admitting: Pharmacist

## 2015-09-13 DIAGNOSIS — I4891 Unspecified atrial fibrillation: Secondary | ICD-10-CM

## 2015-09-13 LAB — POCT INR: INR: 2.7

## 2015-09-14 ENCOUNTER — Ambulatory Visit (HOSPITAL_COMMUNITY)
Admission: RE | Admit: 2015-09-14 | Discharge: 2015-09-14 | Disposition: A | Discharge status: Home / Self Care | Payer: Medicare Other | Source: Ambulatory Visit | Attending: Cardiology | Admitting: Cardiology

## 2015-09-14 DIAGNOSIS — E785 Hyperlipidemia, unspecified: Secondary | ICD-10-CM | POA: Insufficient documentation

## 2015-09-14 DIAGNOSIS — R0989 Other specified symptoms and signs involving the circulatory and respiratory systems: Secondary | ICD-10-CM | POA: Diagnosis not present

## 2015-09-14 DIAGNOSIS — I10 Essential (primary) hypertension: Secondary | ICD-10-CM | POA: Diagnosis not present

## 2015-09-14 DIAGNOSIS — I6523 Occlusion and stenosis of bilateral carotid arteries: Secondary | ICD-10-CM | POA: Diagnosis not present

## 2015-09-19 ENCOUNTER — Ambulatory Visit (INDEPENDENT_AMBULATORY_CARE_PROVIDER_SITE_OTHER): Payer: Medicare Other | Admitting: Cardiology

## 2015-09-19 ENCOUNTER — Encounter: Payer: Self-pay | Admitting: Cardiology

## 2015-09-19 VITALS — BP 155/80 | HR 60 | Ht 70.0 in | Wt 273.0 lb

## 2015-09-19 DIAGNOSIS — I452 Bifascicular block: Secondary | ICD-10-CM

## 2015-09-19 DIAGNOSIS — Z951 Presence of aortocoronary bypass graft: Secondary | ICD-10-CM

## 2015-09-19 DIAGNOSIS — I480 Paroxysmal atrial fibrillation: Secondary | ICD-10-CM

## 2015-09-19 DIAGNOSIS — I4589 Other specified conduction disorders: Secondary | ICD-10-CM | POA: Diagnosis not present

## 2015-09-19 DIAGNOSIS — R7989 Other specified abnormal findings of blood chemistry: Secondary | ICD-10-CM

## 2015-09-19 DIAGNOSIS — I2510 Atherosclerotic heart disease of native coronary artery without angina pectoris: Secondary | ICD-10-CM | POA: Diagnosis not present

## 2015-09-19 DIAGNOSIS — R0609 Other forms of dyspnea: Secondary | ICD-10-CM | POA: Diagnosis not present

## 2015-09-19 DIAGNOSIS — R799 Abnormal finding of blood chemistry, unspecified: Secondary | ICD-10-CM | POA: Diagnosis not present

## 2015-09-19 DIAGNOSIS — E079 Disorder of thyroid, unspecified: Secondary | ICD-10-CM | POA: Diagnosis not present

## 2015-09-19 DIAGNOSIS — R001 Bradycardia, unspecified: Secondary | ICD-10-CM

## 2015-09-19 DIAGNOSIS — Z9861 Coronary angioplasty status: Secondary | ICD-10-CM | POA: Diagnosis not present

## 2015-09-19 DIAGNOSIS — Z7901 Long term (current) use of anticoagulants: Secondary | ICD-10-CM | POA: Insufficient documentation

## 2015-09-19 DIAGNOSIS — R0600 Dyspnea, unspecified: Secondary | ICD-10-CM

## 2015-09-19 LAB — T3 UPTAKE: T3 Uptake: 33 % (ref 22–35)

## 2015-09-19 LAB — T4, FREE: Free T4: 1.06 ng/dL (ref 0.80–1.80)

## 2015-09-19 LAB — TSH: TSH: 4.941 u[IU]/mL — ABNORMAL HIGH (ref 0.350–4.500)

## 2015-09-19 NOTE — Assessment & Plan Note (Signed)
Native LAD DES Feb 2014, LIMA patent, all SVGs occluded

## 2015-09-19 NOTE — Assessment & Plan Note (Signed)
RBBB LAFB and 1st degree AVB

## 2015-09-19 NOTE — Assessment & Plan Note (Signed)
CABG x 05 Aug 2010- LIMA-Dx, SVG-OM, SVG-PDA, SVG-LAD

## 2015-09-19 NOTE — Assessment & Plan Note (Signed)
BMI 39 

## 2015-09-19 NOTE — Assessment & Plan Note (Addendum)
This has been chronic but seems to be getting worse in the last few months.  Suspect this is from chronotropic incomptence

## 2015-09-19 NOTE — Progress Notes (Signed)
09/19/2015 Levi [name]   1929/07/14  [id number]  Primary Physician [name], MD Primary Cardiologist: Dr Harrington Challenger  HPI:  82 y/o male with a history of CAD- s/p CABG in 2011 and native LAD DES in 2014. He has had PAF (CHADs VASc =4) and is on chronic Coumadin Rx. He has had DOE which he says is chronic but worse in the last few months. He denies syncope. He was seen in the ED in [place] with dyspnea and atypical chest pain. His HR was noted to be 52 with 1st degree AVB, LAFB, and RBBB. He was referred to Dr Melvyn Novas, the pt does have a history of smoking and asbestos exposure. Dr Melvyn Novas did not feel his dyspnea was primarily pulmonary. He did have some bradycardia during his PFTs. He is seeing me in the office today for follow up. He tells me he has dyspnea with any exertion, for example walking across the street.    Current Outpatient Prescriptions  Medication Sig Dispense Refill  . amLODipine (NORVASC) 2.5 MG tablet Take 1 tablet (2.5 mg total) by mouth 2 (two) times daily. 180 tablet 3  . aspirin 81 MG tablet Take 1 tablet (81 mg total) by mouth at bedtime. 30 tablet   . famotidine (PEPCID) 20 MG tablet One at bedtime 30 tablet 2  . furosemide (LASIX) 20 MG tablet Take 20 mg by mouth daily.    . isosorbide mononitrate (IMDUR) 60 MG 24 hr tablet Take one-half tablet by  mouth daily 45 tablet 2  . losartan (COZAAR) 100 MG tablet Take 50 mg by mouth 2 (two) times daily.    . nitroGLYCERIN (NITROSTAT) 0.4 MG SL tablet Place 0.4 mg under the tongue every 5 (five) minutes x 3 doses as needed for chest pain.    . pantoprazole (PROTONIX) 40 MG tablet Take 1 tablet (40 mg total) by mouth daily. Take 30-60 min before first meal of the day 30 tablet 2  . polyethylene glycol powder (GLYCOLAX/MIRALAX) powder Take 1 Container by mouth continuous as needed (constipation).     . pravastatin (PRAVACHOL) 20 MG tablet Take 1 tablet by mouth  daily 90 tablet 1  . spironolactone (ALDACTONE) 25 MG tablet Take 25  mg by mouth daily.    Marland Kitchen warfarin (COUMADIN) 5 MG tablet Take 1 tablet by mouth  daily 90 tablet 1   No current facility-administered medications for this visit.    No Known Allergies  Social History   Social History  . Marital Status: Married    Spouse Name: N/A  . Number of Children: N/A  . Years of Education: N/A   Occupational History  . retired Furniture conservator/restorer    Social History Main Topics  . Smoking status: Former Smoker -- 1.00 packs/day for 30 years    Types: Cigarettes    Quit date: 09/07/1975  . Smokeless tobacco: Former Systems developer    Types: [place] date: 07/27/2015  . Alcohol Use: No  . Drug Use: No  . Sexual Activity: Not Currently   Other Topics Concern  . Not on file   Social History Narrative     Review of Systems: General: negative for chills, fever, night sweats or weight changes.  Cardiovascular: negative for chest pain, edema, orthopnea, palpitations, paroxysmal nocturnal dyspnea Dermatological: negative for rash Respiratory: negative for cough or wheezing Urologic: negative for hematuria Abdominal: negative for nausea, vomiting, diarrhea, bright red blood per rectum, melena, or hematemesis Neurologic: negative for visual changes, syncope,  or dizziness All other systems reviewed and are otherwise negative except as noted above.    Blood pressure 155/80, pulse 60, height '5\' 10"'$  (1.778 m), weight 273 lb (123.832 kg).  General appearance: alert, cooperative, no distress and moderately obese Neck: no JVD and soft bruit Lungs: velcro crackles rt Heart: regular rate and rhythm and slow rate Abdomen: truncal obesity Extremities: trace edema Skin: cool and dry Neurologic: Grossly normal  EKG NSR, SB 49, RBBB, LAFB, 1st AVB  Echo Oct 2015 Study Conclusions  - Left ventricle: The cavity size was normal. Wall thickness was increased in a pattern of mild LVH. Systolic function was normal. The estimated ejection fraction was in the range of 55% to  60%. Wall motion was normal; there were no regional wall motion abnormalities. Left ventricular diastolic function parameters were normal. - Ventricular septum: Septal motion showed paradox. These changes are consistent with intraventricular conduction delay. - Left atrium: The atrium was moderately dilated. - Tricuspid valve: There was mild regurgitation. Diastolic regurgitation was present. - Pulmonary arteries: Systolic pressure was mildly increased. PA peak pressure: 42 mm Hg (S).  Myoview 11/28/14 Overall Impression: Low risk stress nuclear study with a moderate size, medium intensity, fixed inferior/apical defect consistent with prior infarct; no significant ischemia.  LV Ejection Fraction: 56%. LV Wall Motion: Inferior hypokinesis.  Carotid Dopplers 09/14/15 Heterogeneous plaque, bilaterally. Stable 1-39% bilateral ICA stenosis. >50% bilateral ECA stenosis. Normal right subclavian artery. Right vertebral artery is patent and antegrade. There is a partial left subclavian steal with "to and fro" flow in the left vertebral artery.  PFTs 09/07/2015 Walked RA x 1 laps @ 185 ft each stopped due to sob/slow pace/ pulse dropped to 40 > NSR with 1st deg/ LAHB  - Spirometry 09/07/2015 FEV1 2.17 (70%) ratio 85 = mild restriction/ no obst  - allergy profile 09/07/15 > Eos 0.4, IgE 10 with neg RAST   ASSESSMENT AND PLAN:   Dyspnea on exertion This has been chronic but seems to be getting worse in the last few months.  Suspect this is from chronotropic incomptence  H/O coronary artery bypass surgery CABG x 05 Aug 2010- LIMA-Dx, SVG-OM, SVG-PDA, SVG-LAD  CAD S/P PCI 2014 Native LAD DES Feb 2014, LIMA patent, all SVGs occluded  PAF (paroxysmal atrial fibrillation) (HCC) NSR/ SB today  Morbid obesity (HCC) BMI 39  Chronic anticoagulation Coumadin Rx  Bifascicular block RBBB LAFB and 1st degree AVB  Bradycardia HR 48 in the office today   PLAN  I  discussed with Dr Lovena Le. I suspect he has chronotropic incompetence and will need a pacemaker. Modified treadmill recommended to document this. F/U pending this result. He did have a mildly elevated TSH on 09/07/15, I ordered a f/u TSH with free T4 today to make sure he is not hypothyroid.   Kerin Ransom K PA-C 09/19/2015 9:32 AM

## 2015-09-19 NOTE — Assessment & Plan Note (Signed)
Coumadin Rx 

## 2015-09-19 NOTE — Assessment & Plan Note (Signed)
NSR/ SB today

## 2015-09-19 NOTE — Patient Instructions (Addendum)
Medication Instructions: CONTINUE ON SAME MEDICATIONS   If you need a refill on your cardiac medications before your next appointment, please call your pharmacy.  Labwork:  TSH T3 FREE T4   Testing/Procedures: Your physician has requested that you have an MODIFIEDexercise tolerance test.  For further information please visit [email or web address]. Please also follow instruction sheet, as given.   Follow-Up: BASED UPON TEST RESULTS...   Any Other Special Instructions Will Be Listed Below (If Applicable).

## 2015-09-19 NOTE — Assessment & Plan Note (Signed)
HR 48 in the office today

## 2015-09-21 ENCOUNTER — Telehealth: Payer: Self-pay | Admitting: Cardiology

## 2015-09-21 NOTE — Telephone Encounter (Signed)
Labs OK per Lurena Joiner - pt advised. Verbalized understanding.

## 2015-09-21 NOTE — Telephone Encounter (Signed)
Pt called in stating Hilda Blades called him yesterday about some lab results and he is returning the phone call. Please f/u with him  Thanks

## 2015-09-27 ENCOUNTER — Ambulatory Visit (INDEPENDENT_AMBULATORY_CARE_PROVIDER_SITE_OTHER): Payer: Medicare Other | Admitting: *Deleted

## 2015-09-27 ENCOUNTER — Encounter: Payer: Medicare Other | Admitting: Physician Assistant

## 2015-09-27 ENCOUNTER — Ambulatory Visit (INDEPENDENT_AMBULATORY_CARE_PROVIDER_SITE_OTHER): Payer: Medicare Other

## 2015-09-27 ENCOUNTER — Encounter: Payer: Self-pay | Admitting: *Deleted

## 2015-09-27 DIAGNOSIS — Z951 Presence of aortocoronary bypass graft: Secondary | ICD-10-CM

## 2015-09-27 DIAGNOSIS — I10 Essential (primary) hypertension: Secondary | ICD-10-CM

## 2015-09-27 DIAGNOSIS — I480 Paroxysmal atrial fibrillation: Secondary | ICD-10-CM

## 2015-09-27 DIAGNOSIS — I4589 Other specified conduction disorders: Secondary | ICD-10-CM

## 2015-09-27 DIAGNOSIS — I442 Atrioventricular block, complete: Secondary | ICD-10-CM

## 2015-09-27 DIAGNOSIS — I4891 Unspecified atrial fibrillation: Secondary | ICD-10-CM | POA: Diagnosis not present

## 2015-09-27 DIAGNOSIS — R0609 Other forms of dyspnea: Secondary | ICD-10-CM | POA: Diagnosis not present

## 2015-09-27 DIAGNOSIS — Z01812 Encounter for preprocedural laboratory examination: Secondary | ICD-10-CM

## 2015-09-27 LAB — EXERCISE TOLERANCE TEST
CSEPEDS: 56 s
CSEPEW: 1.6 METS
MPHR: 136 {beats}/min
Rest HR: 59 {beats}/min

## 2015-09-27 LAB — BASIC METABOLIC PANEL
BUN: 19 mg/dL (ref 7–25)
CALCIUM: 9 mg/dL (ref 8.6–10.3)
CO2: 24 mmol/L (ref 20–31)
CREATININE: 1.06 mg/dL (ref 0.70–1.11)
Chloride: 102 mmol/L (ref 98–110)
Glucose, Bld: 86 mg/dL (ref 65–99)
Potassium: 4.2 mmol/L (ref 3.5–5.3)
SODIUM: 137 mmol/L (ref 135–146)

## 2015-09-27 LAB — POCT INR: INR: 2.3

## 2015-09-27 MED ORDER — SPIRONOLACTONE 25 MG PO TABS: 25.0000 mg | ORAL_TABLET | Freq: Every day | ORAL | Status: DC

## 2015-09-27 NOTE — Patient Instructions (Addendum)
Medication Instructions:  Your physician recommends that you continue on your current medications as directed. Please refer to the Current Medication list given to you today.  Labwork: Pre procedure labs today: BMET, CBCD, INR  Testing/Procedures: Your physician has requested that you have an echocardiogram (have done in the hospital - before 10/05/15). Echocardiography is a painless test that uses sound waves to create images of your heart. It provides your doctor with information about the size and shape of your heart and how well your heart's chambers and valves are working. This procedure takes approximately one hour. There are no restrictions for this procedure.  Your physician has recommended that you have a pacemaker inserted. A pacemaker is a small device that is placed under the skin of your chest or abdomen to help control abnormal heart rhythms. This device uses electrical pulses to prompt the heart to beat at a normal rate. Pacemakers are used to treat heart rhythms that are too slow. Wire (leads) are attached to the pacemaker that goes into the chambers of you heart. This is done in the hospital and usually requires and overnight stay. Please see the instruction sheet given to you today for more information.  Follow-Up: Your physician recommends that you schedule a follow-up appointment in: 10-14 days, after procedure on 10/05/15, with device clinic for wound check.  Your physician recommends that you schedule a follow-up appointment in: 3 months, after procedure on 10/05/15, with Dr. Lovena Le.   If you need a refill on your cardiac medications before your next appointment, please call your pharmacy.  Thank you for choosing [facility]!!     Any Other Special Instructions Will Be Listed Below (If Applicable).  Pacemaker Implantation The heart has its own electrical system, or natural pacemaker, to regulate the heartbeat. Sometimes, the natural pacemaker system of the heart fails and  causes the heart to beat too slowly. If this happens, a pacemaker can be surgically placed to help the heart beat at a normal or programmed rate. A pacemaker is a small, battery-powered device that is placed under the skin and is programmed to sense your heartbeats. If your heart rate is lower than the programmed rate, the pacemaker will pace your heart. Parts of a pacemaker include:  Wires or leads. The leads are placed in the heart and transmit electricity to the heart. The leads are connected to the pulse generator.  Pulse generator. The pulse generator contains a computer and a memory system. The pulse generator also produces the electrical signal that triggers the heart to beat. A pacemaker may be placed if:  You have a slow heartbeat (bradycardia).  You have fainting (syncope).  Shortness of breath (dyspnea) due to heart problems. LET [facility] CARE PROVIDER KNOW ABOUT:  Any allergies you may have.  All medicines you are taking, including vitamins, herbs, eye drops, creams, and over-the-counter medicines.  Previous problems you or members of your family have had with the use of anesthetics.  Any blood disorders you have.  Previous surgeries you have had.  Medical conditions you have.  Possibility of pregnancy, if this applies. RISKS AND COMPLICATIONS Generally, pacemaker implantation is a safe procedure. However, problems can occur and include:  Bleeding.  Unable to place the pacemaker under local sedation.  Infection. BEFORE THE PROCEDURE  You will have blood work drawn before the procedure.  Do not use any tobacco products including cigarettes, chewing tobacco, or electronic cigarettes. If you need help quitting, ask your health care provider.  Do  not eat or drink anything after midnight on the night before the procedure or as directed by your health care provider.  Ask your health care provider about:  Changing or stopping your regular medicines. This is  especially important if you are taking diabetes medicines or blood thinners.  Taking medicines such as aspirin and ibuprofen. These medicines can thin your blood. Do not take these medicines before your procedure if your health care provider asks you not to.  Ask your health care provider if you can take a sip of water with any approved medicines the morning of the procedure. PROCEDURE  The surgery to place a pacemaker is considered a minimally invasive surgical procedure. It is done under a local anesthetic, which is an injection at the incision site that makes the skin numb. You are also given sedation and pain medicine that makes you drowsy during the procedure.   An intravenous line (IV) will be started in your hand or arm so sedation and pain medicine can be given during the pacemaker procedure.  A numbing medicine will be injected into the skin where the pacemaker is to be placed. A small incision will then be made into the skin. The pacemaker is usually placed under the skin near the collarbone.  After the incision has been made, the leads will be inserted into a large vein and guided into the heart using X-ray.  Using the same incision that was used to place the leads, a small pocket will be created under the skin to hold the pulse generator. The leads will then be connected to the pulse generator.  The incision site will then be closed. A bandage (dressing) is placed over the pacemaker site. The dressing is removed 24-48 hours afterward. AFTER THE PROCEDURE  You will be taken to a recovery area after the pacemaker implant. Your vital signs such as blood pressure, heart rate, breathing, and oxygen levels will be monitored.  A chest X-ray will be done after the pacemaker has been implanted. This is to make sure the pacemaker and leads are in the correct place.   This information is not intended to replace advice given to you by your health care provider. Make sure you discuss any  questions you have with your health care provider.   Document Released: 08/09/2002 Document Revised: 09/09/2014 Document Reviewed: 12/24/2011 Elsevier Interactive Patient Education 2016 Elsevier Inc[employer]   Pacemaker Implantation, Care After Refer to this sheet over the next few weeks. These instructions provide you with information on caring for yourself after the procedure. Your health care provider may also give you more specific instructions. Your treatment has been planned according to current medical practices, but problems sometimes occur. Call your health care provider if you have any problems or questions regarding your pacemaker.  WHAT TO EXPECT AFTER THE PROCEDURE  You may feel pain. Some pain is normal. It may last a few days.  A slight bump may be seen over the skin where the device was placed. Sometimes, it is possible to feel the device under the skin. This is normal.  In the months and years afterward, your health care provider will check the device, the leads, and the battery every few months. Eventually, when the battery is low, the device will be replaced. HOME CARE INSTRUCTIONS Medicines  Take medicines only as directed by your health care provider.  If you were prescribed an antibiotic medicine, finish it all even if you start to feel better.  Do not take any  other medicines without asking your health care provider first. Some medicines, including certain painkillers, can cause bleeding in your stomach after surgery. Wound Care  Do not remove the bandage on your chest until directed to do so by your health care provider.  After your bandage is removed, you may see pieces of tape called skin adhesive strips over the area where the cut was made (incision site). Let them fall off on their own.  Check the incision site every day to make sure it is not infected, bleeding, or starting to pull apart.  Do not use lotions or ointments near the incision site unless directed to  do so.  Keep the incision area clean and dry for 2-3 days after the procedure or as directed by your health care provider. It takes several weeks for the incision site to completely heal.  Do not take baths, swim, or use a hot tub until your health care provider approves. Activities  Try to walk a little every day. Exercising is important after this procedure. It is also important to use your shoulder on the side of the pacemaker in daily tasks that do not require exaggerated motion.  Avoid sudden jerking, pulling, or chopping movements that pull your upper arm far away from your body for at least 6 weeks.  Do not lift your upper arm above your shoulders for at least 6 weeks. This means no tennis, golf, or swimming for this period of time. If you sleep with the arm above your head, use a restraint to prevent this from happening as you sleep.  You may go back to work when your health care provider says it is okay. Check with your health care provider before you start to drive or play sports. Other Instructions  Follow diet instructions if they were provided. You should be able to eat what you usually do right away, but you may need to limit your salt intake.  Weigh yourself every day. If you suddenly gain weight, fluid may be building up in your body.  Always carry your pacemaker identification card with you. The card should list the implant date, device model, and manufacturer. Consider wearing a medical alert bracelet or necklace.  Tell all health care providers that you have a pacemaker. This may prevent them from giving you a magnetic resource imaging scan (MRI) because of the strong magnets used during that test.  If you must pass through a metal detector, quickly walk through it. Do not stop under the detector or stand near it.  Avoid places or objects with a strong electric or magnetic field, including:  Engineer, maintenance. When at the airport, let officials know you have a  pacemaker. Your ID card will let you be checked in a way that is safe for you and that will not damage your pacemaker. Also, do not let a security person wave a magnetic wand near your pacemaker. That can make it stop working.  Power plants.  Large electrical generators.  Radiofrequency transmission towers, such as cell phone and radio towers.  Do not use amateur (ham) radio equipment or electric (arc) welding torches. Some devices are safe to use if held at least 1 foot from your pacemaker. These include power tools, lawn mowers, and speakers. If you are unsure of whether something is safe to use, ask your health care provider.  You may safely use electric blankets, heating pads, computers, and microwave ovens.  When using your cell phone, hold it to the ear  opposite the pacemaker. Do not leave your cell phone in a pocket over the pacemaker.  Keep all follow-up visits as directed by your health care provider. This is how your health care provider makes sure your chest is healing the way it should. Ask your health care provider when you should come back to have your stitches or staples taken out.  Have your pacemaker checked every 3-6 months or as directed by your health care provider. Most pacemakers last for 4-8 years before a new one is needed. SEEK MEDICAL CARE IF:  You gain weight suddenly.  Your legs or feet swell more than they have before.  It feels like your heart is fluttering or skipping beats (heart palpitations).  You have a fever. SEEK IMMEDIATE MEDICAL CARE IF:  You have chest pain.  You feel more short of breath than you have felt before.  You feel more light-headed than you have felt before.  You have problems with your incision site, such as swelling or bleeding, or it starts to open up.  You have drainage, redness, swelling, or pain at your incision site.   This information is not intended to replace advice given to you by your health care provider. Make sure  you discuss any questions you have with your health care provider.   Document Released: 03/08/2005 Document Revised: 09/09/2014 Document Reviewed: 12/20/2011 Elsevier Interactive Patient Education [employer].

## 2015-09-28 ENCOUNTER — Ambulatory Visit (HOSPITAL_COMMUNITY)
Admission: RE | Admit: 2015-09-28 | Discharge: 2015-09-28 | Disposition: A | Discharge status: Home / Self Care | Payer: Medicare Other | Source: Ambulatory Visit | Attending: Internal Medicine | Admitting: Internal Medicine

## 2015-09-28 DIAGNOSIS — I517 Cardiomegaly: Secondary | ICD-10-CM | POA: Diagnosis not present

## 2015-09-28 DIAGNOSIS — I059 Rheumatic mitral valve disease, unspecified: Secondary | ICD-10-CM | POA: Diagnosis not present

## 2015-09-28 DIAGNOSIS — I442 Atrioventricular block, complete: Secondary | ICD-10-CM | POA: Diagnosis not present

## 2015-09-28 DIAGNOSIS — I071 Rheumatic tricuspid insufficiency: Secondary | ICD-10-CM | POA: Diagnosis not present

## 2015-09-28 DIAGNOSIS — I10 Essential (primary) hypertension: Secondary | ICD-10-CM | POA: Diagnosis not present

## 2015-09-28 DIAGNOSIS — I340 Nonrheumatic mitral (valve) insufficiency: Secondary | ICD-10-CM | POA: Diagnosis not present

## 2015-09-28 DIAGNOSIS — Z6839 Body mass index (BMI) 39.0-39.9, adult: Secondary | ICD-10-CM | POA: Diagnosis not present

## 2015-09-28 DIAGNOSIS — E669 Obesity, unspecified: Secondary | ICD-10-CM | POA: Insufficient documentation

## 2015-09-28 DIAGNOSIS — R0609 Other forms of dyspnea: Secondary | ICD-10-CM

## 2015-09-28 DIAGNOSIS — E785 Hyperlipidemia, unspecified: Secondary | ICD-10-CM | POA: Diagnosis not present

## 2015-09-28 NOTE — Progress Notes (Signed)
  Echocardiogram 2D Echocardiogram has been performed.  Levi [name] 09/28/2015, 10:48 AM

## 2015-10-04 ENCOUNTER — Other Ambulatory Visit: Payer: Self-pay | Admitting: Physician Assistant

## 2015-10-05 ENCOUNTER — Encounter (HOSPITAL_COMMUNITY): Payer: Self-pay | Admitting: General Practice

## 2015-10-05 ENCOUNTER — Encounter (HOSPITAL_COMMUNITY)
Admission: RE | Disposition: A | Discharge status: Home / Self Care | Payer: Self-pay | Source: Ambulatory Visit | Attending: Internal Medicine

## 2015-10-05 ENCOUNTER — Ambulatory Visit (HOSPITAL_COMMUNITY)
Admission: RE | Admit: 2015-10-05 | Discharge: 2015-10-07 | Disposition: A | Discharge status: Home / Self Care | Payer: Medicare Other | Source: Ambulatory Visit | Attending: Internal Medicine | Admitting: Internal Medicine

## 2015-10-05 ENCOUNTER — Ambulatory Visit: Payer: Medicare Other | Admitting: Internal Medicine

## 2015-10-05 DIAGNOSIS — Z77090 Contact with and (suspected) exposure to asbestos: Secondary | ICD-10-CM | POA: Insufficient documentation

## 2015-10-05 DIAGNOSIS — I480 Paroxysmal atrial fibrillation: Secondary | ICD-10-CM | POA: Insufficient documentation

## 2015-10-05 DIAGNOSIS — E785 Hyperlipidemia, unspecified: Secondary | ICD-10-CM | POA: Diagnosis not present

## 2015-10-05 DIAGNOSIS — Z950 Presence of cardiac pacemaker: Secondary | ICD-10-CM

## 2015-10-05 DIAGNOSIS — Z955 Presence of coronary angioplasty implant and graft: Secondary | ICD-10-CM | POA: Insufficient documentation

## 2015-10-05 DIAGNOSIS — I2510 Atherosclerotic heart disease of native coronary artery without angina pectoris: Secondary | ICD-10-CM | POA: Diagnosis not present

## 2015-10-05 DIAGNOSIS — Z7901 Long term (current) use of anticoagulants: Secondary | ICD-10-CM | POA: Diagnosis not present

## 2015-10-05 DIAGNOSIS — Z7982 Long term (current) use of aspirin: Secondary | ICD-10-CM | POA: Insufficient documentation

## 2015-10-05 DIAGNOSIS — I441 Atrioventricular block, second degree: Secondary | ICD-10-CM

## 2015-10-05 DIAGNOSIS — Z95818 Presence of other cardiac implants and grafts: Secondary | ICD-10-CM

## 2015-10-05 DIAGNOSIS — I10 Essential (primary) hypertension: Secondary | ICD-10-CM | POA: Insufficient documentation

## 2015-10-05 DIAGNOSIS — Z87891 Personal history of nicotine dependence: Secondary | ICD-10-CM | POA: Insufficient documentation

## 2015-10-05 HISTORY — PX: EP IMPLANTABLE DEVICE: SHX172B

## 2015-10-05 HISTORY — DX: Personal history of other diseases of the digestive system: Z87.19

## 2015-10-05 HISTORY — PX: INSERT / REPLACE / REMOVE PACEMAKER: SUR710

## 2015-10-05 HISTORY — DX: Presence of cardiac pacemaker: Z95.0

## 2015-10-05 HISTORY — DX: Bradycardia, unspecified: R00.1

## 2015-10-05 HISTORY — DX: Unspecified asthma, uncomplicated: J45.909

## 2015-10-05 HISTORY — DX: Pneumonia, unspecified organism: J18.9

## 2015-10-05 HISTORY — DX: Unspecified malignant neoplasm of skin, unspecified: C44.90

## 2015-10-05 LAB — PROTIME-INR
INR: 2.16 — AB (ref 0.00–1.49)
Prothrombin Time: 23.9 seconds — ABNORMAL HIGH (ref 11.6–15.2)

## 2015-10-05 LAB — CBC
HEMATOCRIT: 46 % (ref 39.0–52.0)
HEMOGLOBIN: 15.6 g/dL (ref 13.0–17.0)
MCH: 30.8 pg (ref 26.0–34.0)
MCHC: 33.9 g/dL (ref 30.0–36.0)
MCV: 90.9 fL (ref 78.0–100.0)
Platelets: 209 10*3/uL (ref 150–400)
RBC: 5.06 MIL/uL (ref 4.22–5.81)
RDW: 13.8 % (ref 11.5–15.5)
WBC: 7.2 10*3/uL (ref 4.0–10.5)

## 2015-10-05 LAB — SURGICAL PCR SCREEN
MRSA, PCR: NEGATIVE
STAPHYLOCOCCUS AUREUS: NEGATIVE

## 2015-10-05 SURGERY — PACEMAKER IMPLANT
Anesthesia: LOCAL

## 2015-10-05 MED ORDER — LIDOCAINE HCL (PF) 1 % IJ SOLN
INTRAMUSCULAR | Status: AC
  Filled 2015-10-05: qty 30

## 2015-10-05 MED ORDER — SODIUM CHLORIDE 0.9 % IR SOLN
80.0000 mg | Status: AC
  Administered 2015-10-05: 80 mg
  Filled 2015-10-05: qty 2

## 2015-10-05 MED ORDER — CHLORHEXIDINE GLUCONATE 4 % EX LIQD: 60.0000 mL | Freq: Once | CUTANEOUS | Status: DC

## 2015-10-05 MED ORDER — FENTANYL CITRATE (PF) 100 MCG/2ML IJ SOLN
INTRAMUSCULAR | Status: AC
  Filled 2015-10-05: qty 2

## 2015-10-05 MED ORDER — SODIUM CHLORIDE 0.9 % IR SOLN
Status: AC
  Filled 2015-10-05: qty 2

## 2015-10-05 MED ORDER — WARFARIN - PHYSICIAN DOSING INPATIENT
Freq: Every day | Status: DC
  Administered 2015-10-05: 19:00:00

## 2015-10-05 MED ORDER — ASPIRIN EC 81 MG PO TBEC
81.0000 mg | DELAYED_RELEASE_TABLET | Freq: Every day | ORAL | Status: DC
  Administered 2015-10-06: 21:00:00 81 mg via ORAL
  Filled 2015-10-05: qty 1

## 2015-10-05 MED ORDER — CEFAZOLIN SODIUM-DEXTROSE 2-3 GM-% IV SOLR
2.0000 g | Freq: Four times a day (QID) | INTRAVENOUS | Status: AC
  Administered 2015-10-05 – 2015-10-06 (×3): 2 g via INTRAVENOUS
  Filled 2015-10-05 (×3): qty 50

## 2015-10-05 MED ORDER — MUPIROCIN 2 % EX OINT
TOPICAL_OINTMENT | CUTANEOUS | Status: AC
  Administered 2015-10-05: 1 via TOPICAL
  Filled 2015-10-05: qty 22

## 2015-10-05 MED ORDER — DEXTROSE 5 % IV SOLN
3.0000 g | INTRAVENOUS | Status: AC
  Administered 2015-10-05: 3 g via INTRAVENOUS
  Filled 2015-10-05 (×2): qty 3000

## 2015-10-05 MED ORDER — YOU HAVE A PACEMAKER BOOK
Freq: Once | Status: AC
  Administered 2015-10-05: 20:00:00
  Filled 2015-10-05: qty 1

## 2015-10-05 MED ORDER — MIDAZOLAM HCL 5 MG/5ML IJ SOLN
INTRAMUSCULAR | Status: DC | PRN
  Administered 2015-10-05: 2 mg via INTRAVENOUS

## 2015-10-05 MED ORDER — NITROGLYCERIN 0.4 MG SL SUBL: 0.4000 mg | SUBLINGUAL_TABLET | SUBLINGUAL | Status: DC | PRN

## 2015-10-05 MED ORDER — LOSARTAN POTASSIUM 50 MG PO TABS
50.0000 mg | ORAL_TABLET | Freq: Two times a day (BID) | ORAL | Status: DC
  Administered 2015-10-05 – 2015-10-07 (×4): 50 mg via ORAL
  Filled 2015-10-05 (×5): qty 1

## 2015-10-05 MED ORDER — ACETAMINOPHEN 325 MG PO TABS: 325.0000 mg | ORAL_TABLET | ORAL | Status: DC | PRN

## 2015-10-05 MED ORDER — SODIUM CHLORIDE 0.9 % IV SOLN
INTRAVENOUS | Status: DC
  Administered 2015-10-05: 10:00:00 via INTRAVENOUS

## 2015-10-05 MED ORDER — PRAVASTATIN SODIUM 10 MG PO TABS
10.0000 mg | ORAL_TABLET | Freq: Every day | ORAL | Status: DC
  Administered 2015-10-05 – 2015-10-06 (×2): 10 mg via ORAL
  Filled 2015-10-05 (×3): qty 1

## 2015-10-05 MED ORDER — SODIUM CHLORIDE 0.9% FLUSH: 3.0000 mL | INTRAVENOUS | Status: DC | PRN

## 2015-10-05 MED ORDER — MIDAZOLAM HCL 5 MG/5ML IJ SOLN
INTRAMUSCULAR | Status: AC
  Filled 2015-10-05: qty 5

## 2015-10-05 MED ORDER — LIDOCAINE HCL (PF) 1 % IJ SOLN
INTRAMUSCULAR | Status: DC | PRN
  Administered 2015-10-05: 50 mL

## 2015-10-05 MED ORDER — SODIUM CHLORIDE 0.9% FLUSH: 3.0000 mL | Freq: Two times a day (BID) | INTRAVENOUS | Status: DC

## 2015-10-05 MED ORDER — ONDANSETRON HCL 4 MG/2ML IJ SOLN
4.0000 mg | Freq: Four times a day (QID) | INTRAMUSCULAR | Status: DC | PRN
  Administered 2015-10-06: 4 mg via INTRAVENOUS
  Filled 2015-10-05: qty 2

## 2015-10-05 MED ORDER — AMLODIPINE BESYLATE 5 MG PO TABS
2.5000 mg | ORAL_TABLET | Freq: Two times a day (BID) | ORAL | Status: DC
  Administered 2015-10-05 – 2015-10-07 (×4): 2.5 mg via ORAL
  Filled 2015-10-05 (×5): qty 1

## 2015-10-05 MED ORDER — MUPIROCIN 2 % EX OINT
1.0000 "application " | TOPICAL_OINTMENT | Freq: Once | CUTANEOUS | Status: AC
  Administered 2015-10-05: 1 via TOPICAL

## 2015-10-05 MED ORDER — SPIRONOLACTONE 25 MG PO TABS
25.0000 mg | ORAL_TABLET | Freq: Every day | ORAL | Status: DC
  Administered 2015-10-05 – 2015-10-07 (×3): 25 mg via ORAL
  Filled 2015-10-05 (×3): qty 1

## 2015-10-05 MED ORDER — FENTANYL CITRATE (PF) 100 MCG/2ML IJ SOLN
INTRAMUSCULAR | Status: DC | PRN
  Administered 2015-10-05: 25 ug via INTRAVENOUS

## 2015-10-05 MED ORDER — HEPARIN (PORCINE) IN NACL 2-0.9 UNIT/ML-% IJ SOLN
INTRAMUSCULAR | Status: AC
  Filled 2015-10-05: qty 500

## 2015-10-05 MED ORDER — ISOSORBIDE MONONITRATE ER 30 MG PO TB24
30.0000 mg | ORAL_TABLET | Freq: Every day | ORAL | Status: DC
  Administered 2015-10-05 – 2015-10-07 (×3): 30 mg via ORAL
  Filled 2015-10-05 (×3): qty 1

## 2015-10-05 MED ORDER — HEPARIN (PORCINE) IN NACL 2-0.9 UNIT/ML-% IJ SOLN
INTRAMUSCULAR | Status: DC | PRN
  Administered 2015-10-05: 13:00:00

## 2015-10-05 MED ORDER — FUROSEMIDE 20 MG PO TABS
20.0000 mg | ORAL_TABLET | Freq: Every day | ORAL | Status: DC
  Administered 2015-10-05 – 2015-10-07 (×2): 20 mg via ORAL
  Filled 2015-10-05 (×3): qty 1

## 2015-10-05 MED ORDER — FAMOTIDINE 20 MG PO TABS
20.0000 mg | ORAL_TABLET | Freq: Every day | ORAL | Status: DC
  Administered 2015-10-05 – 2015-10-07 (×3): 20 mg via ORAL
  Filled 2015-10-05 (×3): qty 1

## 2015-10-05 MED ORDER — WARFARIN SODIUM 5 MG PO TABS
2.5000 mg | ORAL_TABLET | Freq: Every day | ORAL | Status: DC
  Administered 2015-10-05 – 2015-10-06 (×2): 2.5 mg via ORAL
  Filled 2015-10-05 (×2): qty 0.5

## 2015-10-05 SURGICAL SUPPLY — 8 items
CABLE SURGICAL S-101-97-12 (CABLE) ×1 IMPLANT
HOVERMATT SINGLE USE (MISCELLANEOUS) ×1 IMPLANT
LEAD TENDRIL SDX 2088TC-52CM (Lead) ×1 IMPLANT
LEAD TENDRIL SDX 2088TC-58CM (Lead) ×1 IMPLANT
PAD DEFIB LIFELINK (PAD) ×1 IMPLANT
PPM ASSURITY DR PM2240 (Pacemaker) ×1 IMPLANT
SHEATH CLASSIC 7F (SHEATH) ×2 IMPLANT
TRAY PACEMAKER INSERTION (PACKS) ×1 IMPLANT

## 2015-10-05 NOTE — Progress Notes (Signed)
Orthopedic Tech Progress Note Patient Details:  Levi [name] 02-07-1929 [id number]  Ortho Devices Type of Ortho Device: Arm sling Ortho Device/Splint Interventions: Application   [name] 10/05/2015, 2:50 PM

## 2015-10-05 NOTE — Discharge Instructions (Signed)
° ° °  Supplemental Discharge Instructions for  Pacemaker/Defibrillator Patients  Activity No heavy lifting or vigorous activity with your left/right arm for 6 to 8 weeks.  Do not raise your left/right arm above your head for one week.  Gradually raise your affected arm as drawn below.             10/10/15                       10/11/15                     10/12/15                     10/13/15 __  NO DRIVING for  1 week   ; you may begin driving on  1/51/76   .  WOUND CARE - Keep the wound area clean and dry.  Do not get this area wet for one week. No showers for one week; you may shower on 10/13/15    . - The tape/steri-strips on your wound will fall off; do not pull them off.  No bandage is needed on the site.  DO  NOT apply any creams, oils, or ointments to the wound area. - If you notice any drainage or discharge from the wound, any swelling or bruising at the site, or you develop a fever > 101? F after you are discharged home, call the office at once.  Special Instructions - You are still able to use cellular telephones; use the ear opposite the side where you have your pacemaker/defibrillator.  Avoid carrying your cellular phone near your device. - When traveling through airports, show security personnel your identification card to avoid being screened in the metal detectors.  Ask the security personnel to use the hand wand. - Avoid arc welding equipment, MRI testing (magnetic resonance imaging), TENS units (transcutaneous nerve stimulators).  Call the office for questions about other devices. - Avoid electrical appliances that are in poor condition or are not properly grounded. - Microwave ovens are safe to be near or to operate.  Additional information for defibrillator patients should your device go off: - If your device goes off ONCE and you feel fine afterward, notify the device clinic nurses. - If your device goes off ONCE and you do not feel well afterward, call 911. - If your device  goes off TWICE, call 911. - If your device goes off THREE times in one day, call 911.  DO NOT DRIVE YOURSELF OR A FAMILY MEMBER WITH A DEFIBRILLATOR TO THE HOSPITAL--CALL 911.

## 2015-10-05 NOTE — H&P (Signed)
Progress Notes      Dyspnea on exertion - Erlene Quan, PA-C at 09/19/2015 9:08 AM     Status: Edited Related Problem: Dyspnea on exertion   Expand All Collapse All   This has been chronic but seems to be getting worse in the last few months.  Suspect this is from chronotropic incomptence           Revision History       Date/Time User Action    > 09/19/2015 9:30 AM Erlene Quan, PA-C Edit     09/19/2015 9:23 AM Erlene Quan, PA-C Edit     09/19/2015 9:08 AM Erlene Quan, PA-C Create              H/O coronary artery bypass surgery - Erlene Quan, PA-C at 09/19/2015 9:09 AM     Status: Written Related Problem: H/O coronary artery bypass surgery   Expand All Collapse All   CABG x 05 Aug 2010- LIMA-Dx, SVG-OM, SVG-PDA, SVG-LAD            CAD S/P PCI 2014 - Luke K Kilroy, PA-C at 09/19/2015 9:11 AM     Status: Written Related Problem: CAD S/P PCI 2014   Expand All Collapse All   Native LAD DES Feb 2014, LIMA patent, all SVGs occluded            PAF (paroxysmal atrial fibrillation) ([place]) - Erlene Quan, PA-C at 09/19/2015 9:25 AM     Status: Written Related Problem: PAF (paroxysmal atrial fibrillation) ([place])   Expand All Collapse All   NSR/ SB today            Morbid obesity ([place]) - Erlene Quan, PA-C at 09/19/2015 9:26 AM     Status: Written Related Problem: Morbid obesity ([place])   Expand All Collapse All   BMI 39            Chronic anticoagulation - Erlene Quan, PA-C at 09/19/2015 9:28 AM     Status: Written Related Problem: Chronic anticoagulation   Expand All Collapse All   Coumadin Rx            Bifascicular block - Erlene Quan, PA-C at 09/19/2015 9:29 AM     Status: Written Related Problem: Bifascicular block   Expand All Collapse All   RBBB LAFB and 1st degree AVB            Bradycardia - Erlene Quan, PA-C at 09/19/2015 9:31 AM     Status: Written Related Problem:  Bradycardia   Expand All Collapse All   HR 48 in the office today            Erlene Quan, PA-C at 09/19/2015 9:32 AM     Status: Signed       Expand All Collapse All      09/19/2015 Levi [name]  12/30/1928  [id number]  Primary Physician [name], MD Primary Cardiologist: Dr Harrington Challenger  HPI: 82 y/o male with a history of CAD- s/p CABG in 2011 and native LAD DES in 2014. He has had PAF (CHADs VASc =4) and is on chronic Coumadin Rx. He has had DOE which he says is chronic but worse in the last few months. He denies syncope. He was seen in the ED in [place] with dyspnea and atypical chest pain. His HR was noted to be 52 with 1st degree AVB, LAFB, and RBBB. He was referred to Dr Melvyn Novas, the pt  does have a history of smoking and asbestos exposure. Dr Melvyn Novas did not feel his dyspnea was primarily pulmonary. He did have some bradycardia during his PFTs. He is seeing me in the office today for follow up. He tells me he has dyspnea with any exertion, for example walking across the street.    Current Outpatient Prescriptions  Medication Sig Dispense Refill  . amLODipine (NORVASC) 2.5 MG tablet Take 1 tablet (2.5 mg total) by mouth 2 (two) times daily. 180 tablet 3  . aspirin 81 MG tablet Take 1 tablet (81 mg total) by mouth at bedtime. 30 tablet   . famotidine (PEPCID) 20 MG tablet One at bedtime 30 tablet 2  . furosemide (LASIX) 20 MG tablet Take 20 mg by mouth daily.    . isosorbide mononitrate (IMDUR) 60 MG 24 hr tablet Take one-half tablet by mouth daily 45 tablet 2  . losartan (COZAAR) 100 MG tablet Take 50 mg by mouth 2 (two) times daily.    . nitroGLYCERIN (NITROSTAT) 0.4 MG SL tablet Place 0.4 mg under the tongue every 5 (five) minutes x 3 doses as needed for chest pain.    . pantoprazole (PROTONIX) 40 MG tablet Take 1 tablet (40 mg total) by mouth daily. Take 30-60 min before first meal of the day 30 tablet 2  . polyethylene  glycol powder (GLYCOLAX/MIRALAX) powder Take 1 Container by mouth continuous as needed (constipation).     . pravastatin (PRAVACHOL) 20 MG tablet Take 1 tablet by mouth daily 90 tablet 1  . spironolactone (ALDACTONE) 25 MG tablet Take 25 mg by mouth daily.    Marland Kitchen warfarin (COUMADIN) 5 MG tablet Take 1 tablet by mouth daily 90 tablet 1   No current facility-administered medications for this visit.    No Known Allergies  Social History   Social History  . Marital Status: Married    Spouse Name: N/A  . Number of Children: N/A  . Years of Education: N/A   Occupational History  . retired Furniture conservator/restorer    Social History Main Topics  . Smoking status: Former Smoker -- 1.00 packs/day for 30 years    Types: Cigarettes    Quit date: 09/07/1975  . Smokeless tobacco: Former Systems developer    Types: [place] date: 07/27/2015  . Alcohol Use: No  . Drug Use: No  . Sexual Activity: Not Currently   Other Topics Concern  . Not on file   Social History Narrative     Review of Systems: General: negative for chills, fever, night sweats or weight changes.  Cardiovascular: negative for chest pain, edema, orthopnea, palpitations, paroxysmal nocturnal dyspnea Dermatological: negative for rash Respiratory: negative for cough or wheezing Urologic: negative for hematuria Abdominal: negative for nausea, vomiting, diarrhea, bright red blood per rectum, melena, or hematemesis Neurologic: negative for visual changes, syncope, or dizziness All other systems reviewed and are otherwise negative except as noted above.    Blood pressure 155/80, pulse 60, height '5\' 10"'$  (1.778 m), weight 273 lb (123.832 kg).  General appearance: alert, cooperative, no distress and moderately obese Neck: no JVD and soft bruit Lungs: velcro crackles rt Heart: regular rate and rhythm and slow rate Abdomen: truncal obesity Extremities:  trace edema Skin: cool and dry Neurologic: Grossly normal  EKG NSR, SB 49, RBBB, LAFB, 1st AVB  Echo Oct 2015 Study Conclusions  - Left ventricle: The cavity size was normal. Wall thickness was increased in a pattern of mild LVH. Systolic function was normal. The  estimated ejection fraction was in the range of 55% to 60%. Wall motion was normal; there were no regional wall motion abnormalities. Left ventricular diastolic function parameters were normal. - Ventricular septum: Septal motion showed paradox. These changes are consistent with intraventricular conduction delay. - Left atrium: The atrium was moderately dilated. - Tricuspid valve: There was mild regurgitation. Diastolic regurgitation was present. - Pulmonary arteries: Systolic pressure was mildly increased. PA peak pressure: 42 mm Hg (S).  Myoview 11/28/14 Overall Impression: Low risk stress nuclear study with a moderate size, medium intensity, fixed inferior/apical defect consistent with prior infarct; no significant ischemia.  LV Ejection Fraction: 56%. LV Wall Motion: Inferior hypokinesis.  Carotid Dopplers 09/14/15 Heterogeneous plaque, bilaterally. Stable 1-39% bilateral ICA stenosis. >50% bilateral ECA stenosis. Normal right subclavian artery. Right vertebral artery is patent and antegrade. There is a partial left subclavian steal with "to and fro" flow in the left vertebral artery.  PFTs 09/07/2015 Walked RA x 1 laps @ 185 ft each stopped due to sob/slow pace/ pulse dropped to 40 > NSR with 1st deg/ LAHB  - Spirometry 09/07/2015 FEV1 2.17 (70%) ratio 85 = mild restriction/ no obst  - allergy profile 09/07/15 > Eos 0.4, IgE 10 with neg RAST   ASSESSMENT AND PLAN:   Dyspnea on exertion This has been chronic but seems to be getting worse in the last few months.  Suspect this is from chronotropic incomptence  H/O coronary artery bypass surgery CABG x 05 Aug 2010- LIMA-Dx, SVG-OM,  SVG-PDA, SVG-LAD  CAD S/P PCI 2014 Native LAD DES Feb 2014, LIMA patent, all SVGs occluded  PAF (paroxysmal atrial fibrillation) ([facility]) NSR/ SB today  Morbid obesity ([facility]) BMI 39  Chronic anticoagulation Coumadin Rx  Bifascicular block RBBB LAFB and 1st degree AVB  Bradycardia HR 48 in the office today   PLAN I discussed with Dr Lovena Le. I suspect he has chronotropic incompetence and will need a pacemaker. Modified treadmill recommended to document this. F/U pending this result. He did have a mildly elevated TSH on 09/07/15, I ordered a f/u TSH with free T4 today to make sure he is not hypothyroid.   Erlene Quan PA-C 09/19/2015 9:32 AM  EP Attending  Patient seen and examined. Agree with above. I have had the patient undergo exercise testing and he developed worsening heart block and bradycardia during exercise with HR's going into the 40's. He is on no AV nodal blocking drugs and will undergo DDD PM insertion. The risks/benefits/goals/expectations of PPM implant have been reviewed and he wishes to proceed.  [name].D.

## 2015-10-06 ENCOUNTER — Other Ambulatory Visit: Payer: Self-pay

## 2015-10-06 ENCOUNTER — Encounter (HOSPITAL_COMMUNITY)
Admission: RE | Disposition: A | Discharge status: Home / Self Care | Payer: Self-pay | Source: Ambulatory Visit | Attending: Internal Medicine

## 2015-10-06 ENCOUNTER — Ambulatory Visit (HOSPITAL_COMMUNITY): Payer: Medicare Other

## 2015-10-06 ENCOUNTER — Encounter (HOSPITAL_COMMUNITY): Payer: Self-pay | Admitting: Internal Medicine

## 2015-10-06 DIAGNOSIS — T82120A Displacement of cardiac electrode, initial encounter: Secondary | ICD-10-CM | POA: Diagnosis not present

## 2015-10-06 DIAGNOSIS — I442 Atrioventricular block, complete: Secondary | ICD-10-CM | POA: Diagnosis not present

## 2015-10-06 DIAGNOSIS — Z950 Presence of cardiac pacemaker: Secondary | ICD-10-CM | POA: Diagnosis not present

## 2015-10-06 DIAGNOSIS — Z955 Presence of coronary angioplasty implant and graft: Secondary | ICD-10-CM | POA: Diagnosis not present

## 2015-10-06 DIAGNOSIS — I2510 Atherosclerotic heart disease of native coronary artery without angina pectoris: Secondary | ICD-10-CM | POA: Diagnosis not present

## 2015-10-06 DIAGNOSIS — Z7901 Long term (current) use of anticoagulants: Secondary | ICD-10-CM | POA: Diagnosis not present

## 2015-10-06 DIAGNOSIS — I441 Atrioventricular block, second degree: Secondary | ICD-10-CM | POA: Diagnosis not present

## 2015-10-06 DIAGNOSIS — I480 Paroxysmal atrial fibrillation: Secondary | ICD-10-CM | POA: Diagnosis not present

## 2015-10-06 HISTORY — PX: EP IMPLANTABLE DEVICE: SHX172B

## 2015-10-06 SURGERY — LEAD REVISION/REPAIR
Anesthesia: LOCAL

## 2015-10-06 MED ORDER — LIDOCAINE HCL (PF) 1 % IJ SOLN
INTRAMUSCULAR | Status: DC | PRN
  Administered 2015-10-06: 31 mL
  Administered 2015-10-06: 28 mL

## 2015-10-06 MED ORDER — MIDAZOLAM HCL 5 MG/5ML IJ SOLN
INTRAMUSCULAR | Status: DC | PRN
  Administered 2015-10-06 (×4): 1 mg via INTRAVENOUS

## 2015-10-06 MED ORDER — CHLORHEXIDINE GLUCONATE 4 % EX LIQD
60.0000 mL | Freq: Once | CUTANEOUS | Status: AC
  Administered 2015-10-06: 4 via TOPICAL
  Filled 2015-10-06: qty 60

## 2015-10-06 MED ORDER — GENTAMICIN SULFATE 40 MG/ML IJ SOLN
80.0000 mg | Freq: Once | INTRAMUSCULAR | Status: AC
  Administered 2015-10-06: 80 mg

## 2015-10-06 MED ORDER — SODIUM CHLORIDE 0.9 % IR SOLN
80.0000 mg | Status: AC
  Administered 2015-10-06: 80 mg
  Filled 2015-10-06 (×2): qty 2

## 2015-10-06 MED ORDER — MIDAZOLAM HCL 5 MG/5ML IJ SOLN
INTRAMUSCULAR | Status: AC
  Filled 2015-10-06: qty 5

## 2015-10-06 MED ORDER — ACETAMINOPHEN 325 MG PO TABS: 325.0000 mg | ORAL_TABLET | ORAL | Status: DC | PRN

## 2015-10-06 MED ORDER — FENTANYL CITRATE (PF) 100 MCG/2ML IJ SOLN
INTRAMUSCULAR | Status: DC | PRN
  Administered 2015-10-06 (×2): 25 ug via INTRAVENOUS

## 2015-10-06 MED ORDER — SODIUM CHLORIDE 0.9 % IV SOLN
INTRAVENOUS | Status: DC
  Administered 2015-10-06: 10:00:00 via INTRAVENOUS

## 2015-10-06 MED ORDER — LIDOCAINE HCL (PF) 1 % IJ SOLN
INTRAMUSCULAR | Status: AC
  Filled 2015-10-06: qty 30

## 2015-10-06 MED ORDER — HEPARIN (PORCINE) IN NACL 2-0.9 UNIT/ML-% IJ SOLN
INTRAMUSCULAR | Status: DC | PRN
  Administered 2015-10-06: 18:00:00

## 2015-10-06 MED ORDER — FENTANYL CITRATE (PF) 100 MCG/2ML IJ SOLN
INTRAMUSCULAR | Status: AC
  Filled 2015-10-06: qty 2

## 2015-10-06 MED ORDER — HEPARIN (PORCINE) IN NACL 2-0.9 UNIT/ML-% IJ SOLN
INTRAMUSCULAR | Status: AC
  Filled 2015-10-06: qty 500

## 2015-10-06 MED ORDER — ONDANSETRON HCL 4 MG/2ML IJ SOLN: 4.0000 mg | Freq: Four times a day (QID) | INTRAMUSCULAR | Status: DC | PRN

## 2015-10-06 MED ORDER — LIDOCAINE HCL (PF) 1 % IJ SOLN
INTRAMUSCULAR | Status: AC
  Filled 2015-10-06: qty 60

## 2015-10-06 MED ORDER — CEFAZOLIN SODIUM 1-5 GM-% IV SOLN
1.0000 g | Freq: Four times a day (QID) | INTRAVENOUS | Status: AC
  Administered 2015-10-06 – 2015-10-07 (×3): 1 g via INTRAVENOUS
  Filled 2015-10-06 (×3): qty 50

## 2015-10-06 MED ORDER — SODIUM CHLORIDE 0.9 % IR SOLN
Status: AC
  Filled 2015-10-06: qty 2

## 2015-10-06 MED ORDER — DEXTROSE 5 % IV SOLN
3.0000 g | INTRAVENOUS | Status: AC
  Administered 2015-10-06: 3 g via INTRAVENOUS
  Filled 2015-10-06 (×2): qty 3000

## 2015-10-06 SURGICAL SUPPLY — 7 items
CABLE SURGICAL S-101-97-12 (CABLE) ×2 IMPLANT
HOVERMATT SINGLE USE (MISCELLANEOUS) ×1 IMPLANT
LEAD TENDRIL SDX 2088TC-52CM (Lead) ×1 IMPLANT
PAD DEFIB LIFELINK (PAD) ×2 IMPLANT
SET INTRODUCER MICROPUNCT 5F (INTRODUCER) ×1 IMPLANT
SHEATH CLASSIC 7F (SHEATH) ×1 IMPLANT
TRAY PACEMAKER INSERTION (PACKS) ×2 IMPLANT

## 2015-10-06 NOTE — Progress Notes (Signed)
    SUBJECTIVE: The patient is doing well today.  At this time, he denies chest pain, shortness of breath, or any new concerns. No significant procedure site pain  . amLODipine  2.5 mg Oral BID  . aspirin EC  81 mg Oral QHS  . famotidine  20 mg Oral Daily  . furosemide  20 mg Oral Daily  . isosorbide mononitrate  30 mg Oral Daily  . losartan  50 mg Oral BID  . pravastatin  10 mg Oral q1800  . spironolactone  25 mg Oral Daily  . warfarin  2.5 mg Oral q1800  . Warfarin - Physician Dosing Inpatient   Does not apply q1800      OBJECTIVE: Physical Exam: Filed Vitals:   10/06/15 0426 10/06/15 0600 10/06/15 0755 10/06/15 0758  BP: 132/58 151/71 85/57 101/68  Pulse: 60  62   Temp: 97.6 F (36.4 C)  97.5 F (36.4 C)   TempSrc: Oral  Oral   Resp: '21 25 20   '$ Height:      Weight: 268 lb 1.3 oz (121.6 kg)     SpO2: 97% 97% 97%     Intake/Output Summary (Last 24 hours) at 10/06/15 8185 Last data filed at 10/06/15 0755  Gross per 24 hour  Intake    360 ml  Output   1450 ml  Net  -1090 ml    Telemetry reveals paced rhythm  GEN- The patient is well appearing, alert and oriented x 3 today.   Head- normocephalic, atraumatic Eyes-  Sclera clear, conjunctiva pink Ears- hearing intact Oropharynx- clear Neck- supple, no JVP Lungs- Clear to ausculation bilaterally, normal work of breathing Heart- Regular rate and rhythm, no significant murmurs, no rubs or gallops GI- soft, NT, ND Extremities- no clubbing, cyanosis, or edema Skin- no rash or lesion Psych- euthymic mood, full affect Neuro- no gross deficits appreciated Procedure site is nontender, no swelling,  dressing is dry  LABS: CBC:  Recent Labs  10/05/15 1019  WBC 7.2  HGB 15.6  HCT 46.0  MCV 90.9  PLT 209    RADIOLOGY: Dg Chest 2 View 10/06/2015  CLINICAL DATA:  Pacemaker. EXAM: CHEST  2 VIEW COMPARISON:  08/30/2015 . FINDINGS: Cardiac pacer with lead tips in right atrium right ventricle. Prior CABG. Left atrial  appendage clip. Heart size stable. Low lung volumes. No pleural effusion or pneumothorax. Cervical spine fusion IMPRESSION: 1. Cardiac pacer in stable position. Prior CABG. Heart size stable. 2. Low lung volumes with mild bibasilar atelectasis. Chest is stable from prior exam. Electronically Signed   By: [name]  Register   On: 10/06/2015 07:24    ASSESSMENT AND PLAN:  Active Problems:  1. Mobitz type 2 second degree atrioventricular block      S/p PPM implant yesterday with Dr. Lovena Le      Atrial lead has dislodged      Plan for lead revision today      Patient and daughter at bedside informed.  2. CAD      No CP   3. HTN     Appears stable        Tommye Standard, PA-C 10/06/2015 8:12 AM  ATRIAL LEAD dislodgement  Will need revision Risks reviewd

## 2015-10-07 ENCOUNTER — Ambulatory Visit (HOSPITAL_COMMUNITY): Payer: Medicare Other

## 2015-10-07 ENCOUNTER — Other Ambulatory Visit: Payer: Self-pay

## 2015-10-07 DIAGNOSIS — I2510 Atherosclerotic heart disease of native coronary artery without angina pectoris: Secondary | ICD-10-CM | POA: Diagnosis not present

## 2015-10-07 DIAGNOSIS — Z955 Presence of coronary angioplasty implant and graft: Secondary | ICD-10-CM | POA: Diagnosis not present

## 2015-10-07 DIAGNOSIS — Z7901 Long term (current) use of anticoagulants: Secondary | ICD-10-CM | POA: Diagnosis not present

## 2015-10-07 DIAGNOSIS — I480 Paroxysmal atrial fibrillation: Secondary | ICD-10-CM | POA: Diagnosis not present

## 2015-10-07 DIAGNOSIS — I441 Atrioventricular block, second degree: Secondary | ICD-10-CM | POA: Diagnosis not present

## 2015-10-07 DIAGNOSIS — Z950 Presence of cardiac pacemaker: Secondary | ICD-10-CM | POA: Diagnosis not present

## 2015-10-07 NOTE — Discharge Summary (Signed)
Discharge Summary    Patient ID: Levi [name],  MRN: [id number], DOB/AGE: 1928-12-18 82 y.o.  Admit date: 10/05/2015 Discharge date: 10/07/2015  Primary Care Provider: [name] Primary Cardiologist:  Harrington Challenger  Discharge Diagnoses    Principal Problem:   Mobitz type 2 second degree atrioventricular block Active Problems:   HLD (hyperlipidemia)   Essential hypertension   CAD S/P PCI 2014   Chronic anticoagulation   Allergies No Known Allergies  Diagnostic Studies/Procedures    CHEST 2 VIEW  COMPARISON: 08/30/2015 .  FINDINGS: Cardiac pacer with lead tips in right atrium right ventricle. Prior CABG. Left atrial appendage clip. Heart size stable. Low lung volumes. No pleural effusion or pneumothorax. Cervical spine fusion  IMPRESSION: 1. Cardiac pacer in stable position. Prior CABG. Heart size stable. 2. Low lung volumes with mild bibasilar atelectasis. Chest is stable from prior exam.  _____________   History of Present Illness     82 y/o male with a history of CAD- s/p CABG in 2011 and native LAD DES in 2014. He has had PAF (CHADs VASc =4) and is on chronic Coumadin Rx. He has had DOE which he says is chronic but worse in the last few months. He denies syncope. He was seen in the ED in [place] with dyspnea and atypical chest pain. His HR was noted to be 52 with 1st degree AVB, LAFB, and RBBB. He was referred to Dr Melvyn Novas, the pt does have a history of smoking and asbestos exposure. Dr Melvyn Novas did not feel his dyspnea was primarily pulmonary. He did have some bradycardia during his PFTs.  Second degree AV block.  Hospital Course     Consultants: None  Patient was admitted for his maker implant.  The pacemaker was implanted on February 2 however, he had atrial lead dislodged and underwent revision on February 3.  He underwent post procedural chest x-ray which showed no pneumothorax. The patient was seen by Dr. Curt Bears who felt he was stable for DC home.   Avis DR model [id number] (serial number [id number] ) pacemaker [place] 2088 (serial number [id number]) right atrial lead. [employer]. Jude (serial number [id number]) right ventricular lead  _____________  Discharge Vitals Blood pressure 151/62, pulse 75, temperature 98.3 F (36.8 C), temperature source Oral, resp. rate 23, height '5\' 10"'$  (1.778 m), weight 272 lb 14.9 oz (123.8 kg), SpO2 95 %.  Filed Weights   10/05/15 0931 10/06/15 0426 10/07/15 0421  Weight: 273 lb (123.832 kg) 268 lb 1.3 oz (121.6 kg) 272 lb 14.9 oz (123.8 kg)    Labs & Radiologic Studies     CBC  Recent Labs  10/05/15 1019  WBC 7.2  HGB 15.6  HCT 46.0  MCV 90.9  PLT 209   Dg Chest 2 View  10/06/2015  CLINICAL DATA:  Pacemaker. EXAM: CHEST  2 VIEW COMPARISON:  08/30/2015 . FINDINGS: Cardiac pacer with lead tips in right atrium right ventricle. Prior CABG. Left atrial appendage clip. Heart size stable. Low lung volumes. No pleural effusion or pneumothorax. Cervical spine fusion IMPRESSION: 1. Cardiac pacer in stable position. Prior CABG. Heart size stable. 2. Low lung volumes with mild bibasilar atelectasis. Chest is stable from prior exam. Electronically Signed   By: [name]  Register   On: 10/06/2015 07:24    Disposition   Pt is being discharged home today in good condition.  Follow-up Plans & Appointments    Follow-up Information    Follow up with  [facility] On 10/11/2015.   Specialty:  Cardiology   Why:  coumadin clinic, lab draw   Contact information:   [address] [place] [phone]      Follow up with [facility] On 10/19/2015.   Specialty:  Cardiology   Why:  11:00AM wound check   Contact information:   [address], [place] [phone]      Follow up with Levi Peru, MD On 01/04/2016.   Specialty:  Cardiology   Why:  10:45AM   Contact information:    [address] [place] [address] [phone]      Discharge Instructions    Diet - low sodium heart healthy    Complete by:  As directed      Increase activity slowly    Complete by:  As directed            Discharge Medications   Current Discharge Medication List    CONTINUE these medications which have NOT CHANGED   Details  amLODipine (NORVASC) 2.5 MG tablet Take 1 tablet (2.5 mg total) by mouth 2 (two) times daily. Qty: 180 tablet, Refills: 3    aspirin 81 MG tablet Take 1 tablet (81 mg total) by mouth at bedtime. Qty: 30 tablet    famotidine (PEPCID) 20 MG tablet Take 20 mg by mouth daily.    furosemide (LASIX) 20 MG tablet Take 20 mg by mouth daily.    isosorbide mononitrate (IMDUR) 60 MG 24 hr tablet Take one-half tablet by  mouth daily Qty: 45 tablet, Refills: 2    losartan (COZAAR) 100 MG tablet Take 50 mg by mouth 2 (two) times daily.    nitroGLYCERIN (NITROSTAT) 0.4 MG SL tablet Place 0.4 mg under the tongue every 5 (five) minutes x 3 doses as needed for chest pain.    polyethylene glycol powder (GLYCOLAX/MIRALAX) powder Take 1 Container by mouth daily.     pravastatin (PRAVACHOL) 20 MG tablet Take 1 tablet by mouth  daily Qty: 90 tablet, Refills: 1    spironolactone (ALDACTONE) 25 MG tablet Take 1 tablet (25 mg total) by mouth daily. Qty: 30 tablet, Refills: 6    warfarin (COUMADIN) 5 MG tablet Take 1 tablet by mouth  daily Qty: 90 tablet, Refills: 1            Outstanding Labs/Studies     Duration of Discharge Encounter   Greater than 30 minutes including physician time.  Otilio Connors PAC 10/07/2015, 8:16 AM  I have seen and examined this patient with Tarri Fuller on 10/06/14.  Agree with above, note added to reflect my findings.  On exam, regular rhythm, no murmurs, lungs clear.  Had dual chamber pacemaker placed for heart block.  Atrial lead dislodged and required revision.  Tolerated prcedure well.  Plan for  follow up in EP clinic in 10 days for device check.    Adilyn Humes Curt Bears, MD 10/08/2015 7:50 AM

## 2015-10-07 NOTE — Progress Notes (Signed)
    SUBJECTIVE: The patient is doing well today.  At this time, he denies chest pain, shortness of breath, or any new concerns. No significant procedure site pain.  Had lead revision of RA lead due to dislodgement.  No further complaints.  RA lead parameters within normal limits.  Marland Kitchen amLODipine  2.5 mg Oral BID  . aspirin EC  81 mg Oral QHS  .  ceFAZolin (ANCEF) IV  1 g Intravenous Q6H  . famotidine  20 mg Oral Daily  . furosemide  20 mg Oral Daily  . isosorbide mononitrate  30 mg Oral Daily  . losartan  50 mg Oral BID  . pravastatin  10 mg Oral q1800  . spironolactone  25 mg Oral Daily  . warfarin  2.5 mg Oral q1800  . Warfarin - Physician Dosing Inpatient   Does not apply q1800      OBJECTIVE: Physical Exam: Filed Vitals:   10/06/15 1819 10/06/15 1900 10/06/15 1919 10/07/15 0421  BP: 160/100 191/72 162/97 151/62  Pulse: 0 78 75 75  Temp:   97.5 F (36.4 C) 98.3 F (36.8 C)  TempSrc:   Oral Oral  Resp: '9 22 16 23  '$ Height:      Weight:    272 lb 14.9 oz (123.8 kg)  SpO2: 100% 96% 97% 95%    Intake/Output Summary (Last 24 hours) at 10/07/15 0751 Last data filed at 10/06/15 2110  Gross per 24 hour  Intake     50 ml  Output    175 ml  Net   -125 ml    Telemetry reveals paced rhythm  GEN- The patient is well appearing, alert and oriented x 3 today.   Head- normocephalic, atraumatic Eyes-  Sclera clear, conjunctiva pink Ears- hearing intact Oropharynx- clear Neck- supple, no JVP Lungs- Clear to ausculation bilaterally, normal work of breathing Heart- Regular rate and rhythm, no significant murmurs, no rubs or gallops GI- soft, NT, ND Extremities- no clubbing, cyanosis, or edema Skin- no rash or lesion Psych- euthymic mood, full affect Neuro- no gross deficits appreciated Procedure site is nontender, no swelling,  dressing is dry  LABS: CBC:  Recent Labs  10/05/15 1019  WBC 7.2  HGB 15.6  HCT 46.0  MCV 90.9  PLT 209    RADIOLOGY: Dg Chest 2  View 10/06/2015  CLINICAL DATA:  Pacemaker. EXAM: CHEST  2 VIEW COMPARISON:  08/30/2015 . FINDINGS: Cardiac pacer with lead tips in right atrium right ventricle. Prior CABG. Left atrial appendage clip. Heart size stable. Low lung volumes. No pleural effusion or pneumothorax. Cervical spine fusion IMPRESSION: 1. Cardiac pacer in stable position. Prior CABG. Heart size stable. 2. Low lung volumes with mild bibasilar atelectasis. Chest is stable from prior exam. Electronically Signed   By: [name]  Register   On: 10/06/2015 07:24    ASSESSMENT AND PLAN:  Active Problems:  1. Mobitz type 2 second degree atrioventricular block      S/p PPM implant 2 days ago with Dr. Lovena Le      Atrial lead has dislodged, replaced yesterday with new RA lead      RA lead with stable parameters today, CXR without abnormality.  Plan for discharge.  2. CAD      No CP   3. HTN     Appears stable        Tommye Standard, PA-C 10/07/2015 7:51 AM

## 2015-10-08 ENCOUNTER — Telehealth: Payer: Self-pay | Admitting: Physician Assistant

## 2015-10-08 NOTE — Telephone Encounter (Signed)
Paged by answering service, The has been feeling intermittent dizziness since discharge yesterday. It occurs with sitting and with standing. Last for few mins and resolved. This morning his BP was 110/65 with of 95 @ 14M and BP of 145/93 with pulse of 78 @ 9am.   The patient denies nausea, vomiting, fever, chest pain, palpitations, shortness of breath, orthopnea, PND, syncope, cough, congestion, abdominal pain, hematochezia, melena, lower extremity edema.  Vital during conversation:                                BP                  Pulse Reclining             161/65                 48 Standing              133/83                81 Standing (43mns) 140/75                75   Advised to hold Norvasc if BP below 111/55. Wait 30-60sec before started walking from sitting. IF worsen symptoms go to ER for further evaluation. IF symptoms persisted call office in AM. Avoid fall. The patient and daughter aggress with plan.    [place]ar [place]el, [place]

## 2015-10-09 ENCOUNTER — Encounter (HOSPITAL_COMMUNITY): Payer: Self-pay | Admitting: Cardiology

## 2015-10-10 ENCOUNTER — Institutional Professional Consult (permissible substitution): Payer: Medicare Other | Admitting: Pulmonary Disease

## 2015-10-11 ENCOUNTER — Other Ambulatory Visit (INDEPENDENT_AMBULATORY_CARE_PROVIDER_SITE_OTHER): Payer: Medicare Other

## 2015-10-11 ENCOUNTER — Ambulatory Visit (INDEPENDENT_AMBULATORY_CARE_PROVIDER_SITE_OTHER): Payer: Medicare Other | Admitting: Surgery

## 2015-10-11 DIAGNOSIS — I4891 Unspecified atrial fibrillation: Secondary | ICD-10-CM | POA: Diagnosis not present

## 2015-10-11 DIAGNOSIS — I10 Essential (primary) hypertension: Secondary | ICD-10-CM

## 2015-10-11 DIAGNOSIS — I480 Paroxysmal atrial fibrillation: Secondary | ICD-10-CM | POA: Diagnosis not present

## 2015-10-11 DIAGNOSIS — E785 Hyperlipidemia, unspecified: Secondary | ICD-10-CM | POA: Diagnosis not present

## 2015-10-11 LAB — BASIC METABOLIC PANEL
BUN: 19 mg/dL (ref 7–25)
CHLORIDE: 102 mmol/L (ref 98–110)
CO2: 26 mmol/L (ref 20–31)
Calcium: 8.9 mg/dL (ref 8.6–10.3)
Creat: 1.05 mg/dL (ref 0.70–1.11)
GLUCOSE: 87 mg/dL (ref 65–99)
POTASSIUM: 4 mmol/L (ref 3.5–5.3)
SODIUM: 137 mmol/L (ref 135–146)

## 2015-10-11 LAB — POCT INR: INR: 1.9

## 2015-10-19 ENCOUNTER — Ambulatory Visit (INDEPENDENT_AMBULATORY_CARE_PROVIDER_SITE_OTHER): Payer: Medicare Other | Admitting: Pharmacist

## 2015-10-19 ENCOUNTER — Ambulatory Visit (INDEPENDENT_AMBULATORY_CARE_PROVIDER_SITE_OTHER): Payer: Medicare Other | Admitting: *Deleted

## 2015-10-19 ENCOUNTER — Encounter: Payer: Self-pay | Admitting: Internal Medicine

## 2015-10-19 DIAGNOSIS — I4891 Unspecified atrial fibrillation: Secondary | ICD-10-CM | POA: Diagnosis not present

## 2015-10-19 DIAGNOSIS — I442 Atrioventricular block, complete: Secondary | ICD-10-CM

## 2015-10-19 DIAGNOSIS — I480 Paroxysmal atrial fibrillation: Secondary | ICD-10-CM | POA: Diagnosis not present

## 2015-10-19 DIAGNOSIS — I4589 Other specified conduction disorders: Secondary | ICD-10-CM

## 2015-10-19 DIAGNOSIS — Z950 Presence of cardiac pacemaker: Secondary | ICD-10-CM

## 2015-10-19 LAB — CUP PACEART INCLINIC DEVICE CHECK
Battery Remaining Longevity: 99.6
Battery Voltage: 3.04 V
Brady Statistic RV Percent Paced: 97 %
Date Time Interrogation Session: 20170216160609
Implantable Lead Implant Date: 20170202
Implantable Lead Location: 753859
Lead Channel Impedance Value: 412.5 Ohm
Lead Channel Pacing Threshold Amplitude: 0.625 V
Lead Channel Pacing Threshold Amplitude: 0.75 V
Lead Channel Pacing Threshold Pulse Width: 0.4 ms
Lead Channel Setting Pacing Amplitude: 0.875
Lead Channel Setting Sensing Sensitivity: 2 mV
MDC IDC LEAD IMPLANT DT: 20170202
MDC IDC LEAD LOCATION: 753860
MDC IDC MSMT LEADCHNL RA SENSING INTR AMPL: 2 mV
MDC IDC MSMT LEADCHNL RV IMPEDANCE VALUE: 625 Ohm
MDC IDC MSMT LEADCHNL RV PACING THRESHOLD PULSEWIDTH: 0.4 ms
MDC IDC MSMT LEADCHNL RV SENSING INTR AMPL: 12 mV
MDC IDC PG SERIAL: 7864186
MDC IDC SET LEADCHNL RA PACING AMPLITUDE: 2.5 V
MDC IDC SET LEADCHNL RV PACING PULSEWIDTH: 0.4 ms
MDC IDC STAT BRADY RA PERCENT PACED: 86 %
Pulse Gen Model: 2240

## 2015-10-19 LAB — POCT INR: INR: 2.2

## 2015-10-22 NOTE — Progress Notes (Signed)
Wound check appointment. Steri-strips removed. Wound without redness or edema. Incision edges approximated, wound well healed. Normal device function. Thresholds, sensing, and impedances consistent with implant measurements. Auto capture programmed on for extra safety margin until 3 month visit. Histogram distribution appropriate for patient and level of activity. 14 AMS episodes (5.7%), AF +warfarin, longest 9hr 98mn, peak A 640bpm, peak V 121bpm. 1 PMT episode, no EGM. No high ventricular rates noted. Patient educated about wound care, arm mobility, lifting restrictions. ROV in 3 months with GT.

## 2015-11-02 NOTE — Progress Notes (Signed)
Cardiology Office Note   Date:  11/03/2015   ID:  Levi [name], DOB [birth year], MRN [id number]  PCP:  [name], MD  Cardiologist:   [name], MD   F/U of CAD    History of Present Illness: Levi [name] is a 82 y.o. male with a history of CAD (s/p CABG in 2011; DES to LAD in 2014)  Also ahistory of PAF (CHADSVASc 4), SB, LAFB, RBBB  And SOB  He was seen by [name] in Jan 2017  Underwent PPM placement in Feb ([place]) with lead revision on Feb 3.    Since pacer was placed he says he has had a lot more energey  Wish he had it laced sooner  Denies CP  Breathing is OK  Needs to start walking more   Trying to lose wt       Outpatient Prescriptions Prior to Visit  Medication Sig Dispense Refill  . amLODipine (NORVASC) 2.5 MG tablet Take 1 tablet (2.5 mg total) by mouth 2 (two) times daily. 180 tablet 3  . aspirin 81 MG tablet Take 1 tablet (81 mg total) by mouth at bedtime. 30 tablet   . famotidine (PEPCID) 20 MG tablet Take 20 mg by mouth daily.    . furosemide (LASIX) 20 MG tablet Take 20 mg by mouth daily.    . isosorbide mononitrate (IMDUR) 60 MG 24 hr tablet Take one-half tablet by  mouth daily 45 tablet 2  . losartan (COZAAR) 100 MG tablet Take 50 mg by mouth 2 (two) times daily.    . nitroGLYCERIN (NITROSTAT) 0.4 MG SL tablet Place 0.4 mg under the tongue every 5 (five) minutes x 3 doses as needed for chest pain.    . polyethylene glycol powder (GLYCOLAX/MIRALAX) powder Take 1 Container by mouth daily.     . pravastatin (PRAVACHOL) 20 MG tablet Take 1 tablet by mouth  daily 90 tablet 1  . spironolactone (ALDACTONE) 25 MG tablet Take 1 tablet (25 mg total) by mouth daily. 30 tablet 6  . warfarin (COUMADIN) 5 MG tablet Take 1 tablet by mouth  daily (Patient taking differently: Take 1 tablet by mouth  daily, 2.5 mg on Friday, 5 mg all other days) 90 tablet 1   No facility-administered medications prior to visit.     Allergies:   Review of patient's allergies  indicates no known allergies.   Past Medical History  Diagnosis Date  . HTN (hypertension)   . HLD (hyperlipidemia)   . Edema   . Benign neoplasm of colon   . Dyspnea on exertion   . Osteopenia   . Obesity   . Malaise and fatigue   . CAD (coronary artery disease) Dec 2011    s/p CABG per Dr. Roxy Manns; had normal EF; All SVGs occluded per follow up cath with only LIMA to LAD patent; s/p PCI of the proximal and mid LAD February 2014 per Dr. Burt Knack  . Presence of permanent cardiac pacemaker   . Asthma   . Pneumonia 1960s X 1; 1990s X 1  . Emphysema     "never treated for it" (10/05/2015)  . History of hiatal hernia   . Prostate cancer ([place])   . Skin cancer     "face, nose, top of ears, scalp"  . Symptomatic bradycardia     STJ PPM, 10/05/15, Dr. Lovena Le    Past Surgical History  Procedure Laterality Date  . Median sternotomy  08/23/10  . Anterior cervical decomp/discectomy fusion    .  Left heart catheterization with coronary/graft angiogram N/A 09/01/2012    Procedure: LEFT HEART CATHETERIZATION WITH [name];  Surgeon: [name], MD;  Location: [facility] CATH LAB;  Service: Cardiovascular;  Laterality: N/A;  . Percutaneous coronary stent intervention (pci-s) N/A 10/14/2012    Procedure: PERCUTANEOUS CORONARY STENT INTERVENTION (PCI-S);  Surgeon: [name], MD;  Location: [facility] CATH LAB;  Service: Cardiovascular;  Laterality: N/A;  . Insert / replace / remove pacemaker  10/05/2015  . Back surgery    . Mohs surgery  "@ least twice"  . Prostate biopsy    . Transurethral resection of prostate    . Coronary artery bypass graft  08/23/10    "CABG X4"  . Coronary angioplasty with stent placement  10/14/12    with stent to proximal and mid LAD  . Cataract extraction Right   . Pacemaker insertion      STJ 10/05/15  . Ep implantable device N/A 10/05/2015    Procedure: Pacemaker Implant;  Surgeon: [name], MD;  Location: [place] CV LAB;  Service: Cardiovascular;   Laterality: N/A;  . Ep implantable device N/A 10/06/2015    Procedure: Lead Revision/Repair;  Surgeon: [name], MD;  Location: [place] CV LAB;  Service: Cardiovascular;  Laterality: N/A;     Social History:  The patient  reports that he quit smoking about 40 years ago. His smoking use included Cigarettes. He has a 30 pack-year smoking history. He quit smokeless tobacco use about 3 months ago. His smokeless tobacco use included Chew. He reports that he does not drink alcohol or use illicit drugs.   Family History:  The patient's family history includes Heart attack in his father; Heart disease in his brother; Hypertension in his mother; Kidney cancer in his father; Prostate cancer in his brother; Skin cancer in his brother; Stroke in his mother.    ROS:  Please see the history of present illness. All other systems are reviewed and  Negative to the above problem except as noted.    PHYSICAL EXAM: VS:  BP 142/76 mmHg  Pulse 88  Ht '5\' 10"'$  (1.778 m)  Wt 275 lb 3.2 oz (124.83 kg)  BMI 39.49 kg/m2  SpO2 95%  GEN: Well nourished, well developed, in no acute distress HEENT: normal Neck: no JVD, carotid bruits, or masses Cardiac: RRR; no murmurs, rubs, or gallops,no edema  Respiratory:  clear to auscultation bilaterally, normal work of breathing GI: soft, nontender, nondistended, + BS  No hepatomegaly  MS: no deformity Moving all extremities   Skin: warm and dry, no rash Neuro:  Strength and sensation are intact Psych: euthymic mood, full affect   EKG:  EKG is not ordered today.   Lipid Panel    Component Value Date/Time   CHOL 113 06/01/2014 0930   TRIG 115.0 06/01/2014 0930   HDL 32.40* 06/01/2014 0930   CHOLHDL 3 06/01/2014 0930   VLDL 23.0 06/01/2014 0930   LDLCALC 58 06/01/2014 0930   LDLDIRECT 70.7 06/10/2013 1344      Wt Readings from Last 3 Encounters:  11/03/15 275 lb 3.2 oz (124.83 kg)  10/07/15 272 lb 14.9 oz (123.8 kg)  09/19/15 273 lb (123.832 kg)        ASSESSMENT AND PLAN:  1  CAD  No symptoms to sugg angina  Keep on same reginen  2.  Bradycardia  S/p PPM    3.  HL  Keep on pravastatin  4.  HTN  OK control  5.  Atrial flutter  Continue anticoag     Disposition:   FU with me in October     Signed, Roberta Angell, MD  11/03/2015 1:35 PM    [facility] [address], [place], [place]  [address] Phone: [phone]; Fax: [phone]

## 2015-11-03 ENCOUNTER — Encounter: Payer: Self-pay | Admitting: Internal Medicine

## 2015-11-03 ENCOUNTER — Ambulatory Visit (INDEPENDENT_AMBULATORY_CARE_PROVIDER_SITE_OTHER): Payer: Medicare Other | Admitting: Internal Medicine

## 2015-11-03 ENCOUNTER — Ambulatory Visit (INDEPENDENT_AMBULATORY_CARE_PROVIDER_SITE_OTHER): Payer: Medicare Other | Admitting: *Deleted

## 2015-11-03 VITALS — BP 142/76 | HR 88 | Ht 70.0 in | Wt 275.2 lb

## 2015-11-03 DIAGNOSIS — I2510 Atherosclerotic heart disease of native coronary artery without angina pectoris: Secondary | ICD-10-CM

## 2015-11-03 DIAGNOSIS — Z9861 Coronary angioplasty status: Secondary | ICD-10-CM | POA: Diagnosis not present

## 2015-11-03 DIAGNOSIS — I25709 Atherosclerosis of coronary artery bypass graft(s), unspecified, with unspecified angina pectoris: Secondary | ICD-10-CM | POA: Diagnosis not present

## 2015-11-03 DIAGNOSIS — I4891 Unspecified atrial fibrillation: Secondary | ICD-10-CM | POA: Diagnosis not present

## 2015-11-03 DIAGNOSIS — I480 Paroxysmal atrial fibrillation: Secondary | ICD-10-CM

## 2015-11-03 LAB — POCT INR: INR: 2.4

## 2015-11-03 NOTE — Patient Instructions (Signed)
Your physician recommends that you continue on your current medications as directed. Please refer to the Current Medication list given to you today. (you can stop pepcid and see how your reflux does)  Your physician wants you to follow-up in: October, 2017 with Dr. Harrington Challenger.  You will receive a reminder letter in the mail two months in advance. If you don't receive a letter, please call our office to schedule the follow-up appointment.

## 2015-11-16 ENCOUNTER — Encounter: Payer: Self-pay | Admitting: Internal Medicine

## 2015-11-20 DIAGNOSIS — L570 Actinic keratosis: Secondary | ICD-10-CM | POA: Diagnosis not present

## 2015-11-20 DIAGNOSIS — Z85828 Personal history of other malignant neoplasm of skin: Secondary | ICD-10-CM | POA: Diagnosis not present

## 2015-11-20 DIAGNOSIS — L821 Other seborrheic keratosis: Secondary | ICD-10-CM | POA: Diagnosis not present

## 2015-11-24 ENCOUNTER — Ambulatory Visit (INDEPENDENT_AMBULATORY_CARE_PROVIDER_SITE_OTHER): Payer: Medicare Other | Admitting: *Deleted

## 2015-11-24 DIAGNOSIS — I4891 Unspecified atrial fibrillation: Secondary | ICD-10-CM

## 2015-11-24 DIAGNOSIS — I480 Paroxysmal atrial fibrillation: Secondary | ICD-10-CM

## 2015-11-24 LAB — POCT INR: INR: 2.4

## 2015-12-20 ENCOUNTER — Telehealth: Payer: Self-pay | Admitting: Internal Medicine

## 2015-12-20 NOTE — Telephone Encounter (Signed)
Request for surgical clearance:  1. What type of surgery is being performed?Tooth Extraction  2. When is this surgery scheduled? Pending    3. Are there any medications that need to be held prior to surgery and how long?Coumadin to held 3-5 days prior    4. Name of physician performing surgery? Dr.Scott Walrond   5. What is your office phone and fax number? [phone]

## 2015-12-20 NOTE — Telephone Encounter (Signed)
Returned call to [employer] with Dr.Walrond's office.Patient scheduled to have 1 tooth extraction on Mon 12/25/15.Dr.Walrond advises to hold coumadin at least the day before he wants to make sure ok with Dr.Taylor.Office closed on Friday ok to leave message.

## 2015-12-21 ENCOUNTER — Telehealth: Payer: Self-pay | Admitting: Internal Medicine

## 2015-12-21 NOTE — Telephone Encounter (Signed)
Walk in pt form- [employer] paper dropped off - gave to [facility]

## 2015-12-22 ENCOUNTER — Ambulatory Visit (INDEPENDENT_AMBULATORY_CARE_PROVIDER_SITE_OTHER): Payer: Medicare Other

## 2015-12-22 DIAGNOSIS — I4891 Unspecified atrial fibrillation: Secondary | ICD-10-CM | POA: Diagnosis not present

## 2015-12-22 DIAGNOSIS — I480 Paroxysmal atrial fibrillation: Secondary | ICD-10-CM

## 2015-12-22 LAB — POCT INR: INR: 1.6

## 2015-12-22 NOTE — Telephone Encounter (Signed)
Received fax from Dr. Wells Guiles office that patient is pending 9 simple extractions and 1 surgical extraction.  Dr. Harrington Challenger has okayed him holding Coumadin x 5 days prior to this procedure.  Pt has an appt this morning in the Coumadin clinic and we will try to clarify how many teeth are being pulled and date of the procedure.

## 2015-12-23 ENCOUNTER — Other Ambulatory Visit: Payer: Self-pay | Admitting: Internal Medicine

## 2015-12-25 ENCOUNTER — Telehealth: Payer: Self-pay

## 2015-12-25 NOTE — Telephone Encounter (Signed)
Discussed with pt.  He does need 10 extractions but there is 1 tooth that is causing him pain that he wants to have pulled on 4/24.   Pt's INR on 4/21 was 1.6.  Ok to have tooth pulled on Monday then hold x 5 days when they can do the other 9.

## 2015-12-25 NOTE — Telephone Encounter (Signed)
Pt called states he started holding Coumadin on Saturday 12/23/15 for dental extractions x 9 on Wednesday 12/27/15.  Pt will resume Coumadin after procedure on 12/27/15 at previous dosage and will keep scheduled f/u appt on 01/05/16 to recheck INR post procedure.

## 2016-01-04 ENCOUNTER — Encounter: Payer: Medicare Other | Admitting: Internal Medicine

## 2016-01-05 ENCOUNTER — Ambulatory Visit (INDEPENDENT_AMBULATORY_CARE_PROVIDER_SITE_OTHER): Payer: Medicare Other | Admitting: Pharmacist

## 2016-01-05 DIAGNOSIS — I4891 Unspecified atrial fibrillation: Secondary | ICD-10-CM | POA: Diagnosis not present

## 2016-01-05 DIAGNOSIS — I480 Paroxysmal atrial fibrillation: Secondary | ICD-10-CM | POA: Diagnosis not present

## 2016-01-05 LAB — POCT INR: INR: 1.3

## 2016-01-06 ENCOUNTER — Other Ambulatory Visit: Payer: Self-pay | Admitting: Internal Medicine

## 2016-01-19 ENCOUNTER — Encounter: Payer: Self-pay | Admitting: Internal Medicine

## 2016-01-19 ENCOUNTER — Ambulatory Visit (INDEPENDENT_AMBULATORY_CARE_PROVIDER_SITE_OTHER): Payer: Medicare Other | Admitting: *Deleted

## 2016-01-19 ENCOUNTER — Ambulatory Visit (INDEPENDENT_AMBULATORY_CARE_PROVIDER_SITE_OTHER): Payer: Medicare Other | Admitting: Internal Medicine

## 2016-01-19 VITALS — BP 112/82 | HR 70 | Ht 70.0 in | Wt 274.0 lb

## 2016-01-19 DIAGNOSIS — I2510 Atherosclerotic heart disease of native coronary artery without angina pectoris: Secondary | ICD-10-CM

## 2016-01-19 DIAGNOSIS — Z9861 Coronary angioplasty status: Secondary | ICD-10-CM | POA: Diagnosis not present

## 2016-01-19 DIAGNOSIS — I480 Paroxysmal atrial fibrillation: Secondary | ICD-10-CM

## 2016-01-19 DIAGNOSIS — I4891 Unspecified atrial fibrillation: Secondary | ICD-10-CM

## 2016-01-19 DIAGNOSIS — I441 Atrioventricular block, second degree: Secondary | ICD-10-CM

## 2016-01-19 LAB — CUP PACEART INCLINIC DEVICE CHECK
Brady Statistic RV Percent Paced: 96 %
Date Time Interrogation Session: 20170519113848
Implantable Lead Implant Date: 20170202
Implantable Lead Location: 753860
Lead Channel Impedance Value: 650 Ohm
Lead Channel Pacing Threshold Amplitude: 0.625 V
Lead Channel Pacing Threshold Amplitude: 0.75 V
Lead Channel Sensing Intrinsic Amplitude: 12 mV
Lead Channel Setting Sensing Sensitivity: 2 mV
MDC IDC LEAD IMPLANT DT: 20170202
MDC IDC LEAD LOCATION: 753859
MDC IDC MSMT BATTERY REMAINING LONGEVITY: 102
MDC IDC MSMT BATTERY VOLTAGE: 2.99 V
MDC IDC MSMT LEADCHNL RA IMPEDANCE VALUE: 475 Ohm
MDC IDC MSMT LEADCHNL RA PACING THRESHOLD PULSEWIDTH: 0.4 ms
MDC IDC MSMT LEADCHNL RA SENSING INTR AMPL: 1.6 mV
MDC IDC MSMT LEADCHNL RV PACING THRESHOLD PULSEWIDTH: 0.4 ms
MDC IDC SET LEADCHNL RA PACING AMPLITUDE: 2.5 V
MDC IDC SET LEADCHNL RV PACING AMPLITUDE: 0.875
MDC IDC SET LEADCHNL RV PACING PULSEWIDTH: 0.4 ms
MDC IDC STAT BRADY RA PERCENT PACED: 91 %
Pulse Gen Model: 2240
Pulse Gen Serial Number: 7864186

## 2016-01-19 LAB — POCT INR: INR: 2.8

## 2016-01-19 NOTE — Patient Instructions (Signed)
Medication Instructions:  Your physician recommends that you continue on your current medications as directed. Please refer to the Current Medication list given to you today.   Labwork: None ordered   Testing/Procedures: None ordered   Follow-Up: Your physician wants you to follow-up in: 9 months with Dr Knox Saliva will receive a reminder letter in the mail two months in advance. If you don't receive a letter, please call our office to schedule the follow-up appointment.   Remote monitoring is used to monitor your Pacemaker of ICD from home. This monitoring reduces the number of office visits required to check your device to one time per year. It allows [place] to keep an eye on the functioning of your device to ensure it is working properly. You are scheduled for a device check from home on 04/19/16. You may send your transmission at any time that day. If you have a wireless device, the transmission will be sent automatically. After your physician reviews your transmission, you will receive a postcard with your next transmission date.     Any Other Special Instructions Will Be Listed Below (If Applicable).     If you need a refill on your cardiac medications before your next appointment, please call your pharmacy.

## 2016-01-19 NOTE — Progress Notes (Signed)
HPI Mr. Vanmaanen returns today for followup. He is a pleasant obese 82 yo man with a h/o CAD, s/p CABG, HTN who was found to have symptomatic 2:1 AV block and underwent PPM insertion in February complicated by PPM lead dislodgement. He was revised successfully and returns today for followup. In the interim, he has done well. No chest pain. He notes that his activity has improved since his PPM was placed and he denies other complaints. He has been unable to lose weight. No Known Allergies   Current Outpatient Prescriptions  Medication Sig Dispense Refill  . amLODipine (NORVASC) 2.5 MG tablet Take 1 tablet (2.5 mg total) by mouth 2 (two) times daily. 180 tablet 3  . aspirin 81 MG tablet Take 1 tablet (81 mg total) by mouth at bedtime. 30 tablet   . famotidine (PEPCID) 20 MG tablet Take 20 mg by mouth daily.    . furosemide (LASIX) 20 MG tablet Take 1 tablet (20 mg total) by mouth daily. 90 tablet 1  . isosorbide mononitrate (IMDUR) 60 MG 24 hr tablet Take one-half tablet by  mouth daily 45 tablet 2  . losartan (COZAAR) 100 MG tablet Take 50 mg by mouth 2 (two) times daily.    . nitroGLYCERIN (NITROSTAT) 0.4 MG SL tablet Place 0.4 mg under the tongue every 5 (five) minutes x 3 doses as needed for chest pain.    . polyethylene glycol powder (GLYCOLAX/MIRALAX) powder Take 1 Container by mouth daily.     . pravastatin (PRAVACHOL) 20 MG tablet Take 1 tablet by mouth  daily 90 tablet 0  . spironolactone (ALDACTONE) 25 MG tablet Take 1 tablet (25 mg total) by mouth daily. 30 tablet 6  . warfarin (COUMADIN) 5 MG tablet Take 1 tablet by mouth  daily 90 tablet 0   No current facility-administered medications for this visit.     Past Medical History  Diagnosis Date  . HTN (hypertension)   . HLD (hyperlipidemia)   . Edema   . Benign neoplasm of colon   . Dyspnea on exertion   . Osteopenia   . Obesity   . Malaise and fatigue   . CAD (coronary artery disease) Dec 2011    s/p CABG per Dr.  Roxy Manns; had normal EF; All SVGs occluded per follow up cath with only LIMA to LAD patent; s/p PCI of the proximal and mid LAD February 2014 per Dr. Burt Knack  . Presence of permanent cardiac pacemaker   . Asthma   . Pneumonia 1960s X 1; 1990s X 1  . Emphysema     "never treated for it" (10/05/2015)  . History of hiatal hernia   . Prostate cancer ([place])   . Skin cancer     "face, nose, top of ears, scalp"  . Symptomatic bradycardia     STJ PPM, 10/05/15, Dr. Lovena Le    ROS:   All systems reviewed and negative except as noted in the HPI.   Past Surgical History  Procedure Laterality Date  . Median sternotomy  08/23/10  . Anterior cervical decomp/discectomy fusion    . Left heart catheterization with coronary/graft angiogram N/A 09/01/2012    Procedure: LEFT HEART CATHETERIZATION WITH [name];  Surgeon: [name], MD;  Location: [facility] CATH LAB;  Service: Cardiovascular;  Laterality: N/A;  . Percutaneous coronary stent intervention (pci-s) N/A 10/14/2012    Procedure: PERCUTANEOUS CORONARY STENT INTERVENTION (PCI-S);  Surgeon: [name], MD;  Location: [facility] CATH LAB;  Service: Cardiovascular;  Laterality: N/A;  . Insert / replace / remove pacemaker  10/05/2015  . Back surgery    . Mohs surgery  "@ least twice"  . Prostate biopsy    . Transurethral resection of prostate    . Coronary artery bypass graft  08/23/10    "CABG X4"  . Coronary angioplasty with stent placement  10/14/12    with stent to proximal and mid LAD  . Cataract extraction Right   . Pacemaker insertion      STJ 10/05/15  . Ep implantable device N/A 10/05/2015    Procedure: Pacemaker Implant;  Surgeon: [name], MD;  Location: [place] CV LAB;  Service: Cardiovascular;  Laterality: N/A;  . Ep implantable device N/A 10/06/2015    Procedure: Lead Revision/Repair;  Surgeon: [name], MD;  Location: [place] CV LAB;  Service: Cardiovascular;  Laterality: N/A;     Family History  Problem  Relation Age of Onset  . Heart disease Brother   . Skin cancer Brother   . Prostate cancer Brother   . Kidney cancer Father   . Heart attack Father   . Hypertension Mother   . Stroke Mother      Social History   Social History  . Marital Status: Married    Spouse Name: N/A  . Number of Children: N/A  . Years of Education: N/A   Occupational History  . retired Furniture conservator/restorer    Social History Main Topics  . Smoking status: Former Smoker -- 1.00 packs/day for 30 years    Types: Cigarettes    Quit date: 09/07/1975  . Smokeless tobacco: Former Systems developer    Types: [place] date: 07/27/2015  . Alcohol Use: No  . Drug Use: No  . Sexual Activity: Not Currently   Other Topics Concern  . Not on file   Social History Narrative     BP 112/82 mmHg  Pulse 70  Ht '5\' 10"'$  (1.778 m)  Wt 274 lb (124.286 kg)  BMI 39.32 kg/m2  Physical Exam:  Well appearing obese elderly man, NAD HEENT: Unremarkable Neck:  No JVD, no thyromegally Lymphatics:  No adenopathy Back:  No CVA tenderness Lungs:  Clear with no wheezes HEART:  Regular rate rhythm, no murmurs, no rubs, no clicks Abd:  soft, positive bowel sounds, no organomegally, no rebound, no guarding Ext:  2 plus pulses, no edema, no cyanosis, no clubbing Skin:  No rashes no nodules Neuro:  CN II through XII intact, motor grossly intact  EKG - nsr with AV pacing  DEVICE  Normal device function.  See PaceArt for details.   Assess/Plan: 1. Symptomatic 2:1 AV block - he is s/p ppm and doing well. He will followup in several months 2. HTN - his blood pressure is well controlled today. He is encouraged to lose weight. 3. CAD - he is active working in his garden. Will follow. 4. PPM - his [employer] DDD PM is working normally. Will follow.  Levi [name].D.

## 2016-01-24 NOTE — Addendum Note (Signed)
Addended by: Freada Bergeron on: 01/24/2016 04:16 PM   Modules accepted: Orders

## 2016-01-30 ENCOUNTER — Telehealth: Payer: Self-pay | Admitting: Internal Medicine

## 2016-01-30 NOTE — Telephone Encounter (Signed)
Returned call to pt, pt states he had some dental extractions done a couple weeks ago and has a piece of a tooth left sticking through gums.  Pt is scheduled to go to have removed tomorrow 01/31/16.  Pt states dental office advised pt he did not need to hold his Coumadin prior to procedure, advised pt ok to continue on Coumadin per DDS recommendation.  We do not want pt to hold Coumadin unless necessary.

## 2016-01-30 NOTE — Telephone Encounter (Signed)
New msesage  Pt wife calling to speak w/ Coumadin about possible upcoming surgery. Please call back and discuss.

## 2016-02-16 ENCOUNTER — Ambulatory Visit (INDEPENDENT_AMBULATORY_CARE_PROVIDER_SITE_OTHER): Payer: Medicare Other | Admitting: *Deleted

## 2016-02-16 DIAGNOSIS — I4891 Unspecified atrial fibrillation: Secondary | ICD-10-CM

## 2016-02-16 DIAGNOSIS — I480 Paroxysmal atrial fibrillation: Secondary | ICD-10-CM | POA: Diagnosis not present

## 2016-02-16 LAB — POCT INR: INR: 1.9

## 2016-03-15 ENCOUNTER — Ambulatory Visit (INDEPENDENT_AMBULATORY_CARE_PROVIDER_SITE_OTHER): Payer: Medicare Other | Admitting: *Deleted

## 2016-03-15 DIAGNOSIS — I480 Paroxysmal atrial fibrillation: Secondary | ICD-10-CM

## 2016-03-15 DIAGNOSIS — I4891 Unspecified atrial fibrillation: Secondary | ICD-10-CM

## 2016-03-15 LAB — POCT INR: INR: 1.4

## 2016-03-25 ENCOUNTER — Ambulatory Visit (INDEPENDENT_AMBULATORY_CARE_PROVIDER_SITE_OTHER): Payer: Medicare Other | Admitting: Pharmacist

## 2016-03-25 DIAGNOSIS — I4891 Unspecified atrial fibrillation: Secondary | ICD-10-CM | POA: Diagnosis not present

## 2016-03-25 DIAGNOSIS — I480 Paroxysmal atrial fibrillation: Secondary | ICD-10-CM

## 2016-03-25 LAB — POCT INR: INR: 2.5

## 2016-04-22 ENCOUNTER — Ambulatory Visit (INDEPENDENT_AMBULATORY_CARE_PROVIDER_SITE_OTHER): Payer: Medicare Other | Admitting: *Deleted

## 2016-04-22 ENCOUNTER — Telehealth: Payer: Self-pay | Admitting: Internal Medicine

## 2016-04-22 DIAGNOSIS — I441 Atrioventricular block, second degree: Secondary | ICD-10-CM | POA: Diagnosis not present

## 2016-04-22 NOTE — Telephone Encounter (Signed)
Called pt back. Pts home monitor was audibley beeping. Had pt press the white button and the beeping stopped. Gave pts wife number to [employer] to have them further troubleshoot monitor. Pts wife voiced understanding.

## 2016-04-22 NOTE — Progress Notes (Signed)
Remote pacemaker transmission.   

## 2016-04-22 NOTE — Telephone Encounter (Signed)
New Message   1. Has your device fired? no  2. Is you device beeping? Per pt wife yes   3. Are you experiencing draining or swelling at device site? no  4. Are you calling to see if we received your device transmission? no  5. Have you passed out? no  Please call back to discuss

## 2016-04-24 ENCOUNTER — Encounter: Payer: Self-pay | Admitting: Cardiology

## 2016-04-26 ENCOUNTER — Other Ambulatory Visit: Payer: Self-pay

## 2016-04-26 ENCOUNTER — Ambulatory Visit (INDEPENDENT_AMBULATORY_CARE_PROVIDER_SITE_OTHER): Payer: Medicare Other | Admitting: *Deleted

## 2016-04-26 DIAGNOSIS — I480 Paroxysmal atrial fibrillation: Secondary | ICD-10-CM | POA: Diagnosis not present

## 2016-04-26 DIAGNOSIS — I4891 Unspecified atrial fibrillation: Secondary | ICD-10-CM

## 2016-04-26 LAB — POCT INR: INR: 2.8

## 2016-04-29 ENCOUNTER — Telehealth: Payer: Self-pay | Admitting: Cardiology

## 2016-04-29 NOTE — Telephone Encounter (Signed)
Follow Up:    Pt said he received a letter from you.

## 2016-04-30 NOTE — Telephone Encounter (Signed)
LMOVM for pt to return call 

## 2016-05-01 LAB — CUP PACEART REMOTE DEVICE CHECK
Battery Remaining Longevity: 101 mo
Brady Statistic AP VP Percent: 90 %
Brady Statistic AS VP Percent: 6.4 %
Brady Statistic RA Percent Paced: 90 %
Brady Statistic RV Percent Paced: 97 %
Implantable Lead Implant Date: 20170202
Implantable Lead Location: 753859
Implantable Lead Location: 753860
Lead Channel Impedance Value: 610 Ohm
Lead Channel Pacing Threshold Pulse Width: 0.4 ms
Lead Channel Sensing Intrinsic Amplitude: 12 mV
Lead Channel Setting Pacing Amplitude: 2.5 V
Lead Channel Setting Sensing Sensitivity: 2 mV
MDC IDC LEAD IMPLANT DT: 20170202
MDC IDC MSMT BATTERY REMAINING PERCENTAGE: 95.5 %
MDC IDC MSMT BATTERY VOLTAGE: 2.99 V
MDC IDC MSMT LEADCHNL RA IMPEDANCE VALUE: 400 Ohm
MDC IDC MSMT LEADCHNL RA PACING THRESHOLD AMPLITUDE: 0.75 V
MDC IDC MSMT LEADCHNL RA SENSING INTR AMPL: 2.1 mV
MDC IDC MSMT LEADCHNL RV PACING THRESHOLD AMPLITUDE: 0.875 V
MDC IDC MSMT LEADCHNL RV PACING THRESHOLD PULSEWIDTH: 0.4 ms
MDC IDC SESS DTM: 20170821075456
MDC IDC SET LEADCHNL RV PACING AMPLITUDE: 1.125
MDC IDC SET LEADCHNL RV PACING PULSEWIDTH: 0.4 ms
MDC IDC STAT BRADY AP VS PERCENT: 1.8 %
MDC IDC STAT BRADY AS VS PERCENT: 1 %
Pulse Gen Model: 2240
Pulse Gen Serial Number: 7864186

## 2016-05-01 NOTE — Telephone Encounter (Signed)
Spoke w/ pt and informed him that the letter was to let him know that we received his and that this next time we will check his device from home is 07-22-2016. Pt verbalized understanding.

## 2016-05-04 ENCOUNTER — Other Ambulatory Visit: Payer: Self-pay | Admitting: Internal Medicine

## 2016-05-16 DIAGNOSIS — Z961 Presence of intraocular lens: Secondary | ICD-10-CM | POA: Diagnosis not present

## 2016-05-16 DIAGNOSIS — H2512 Age-related nuclear cataract, left eye: Secondary | ICD-10-CM | POA: Diagnosis not present

## 2016-05-23 DIAGNOSIS — L821 Other seborrheic keratosis: Secondary | ICD-10-CM | POA: Diagnosis not present

## 2016-05-23 DIAGNOSIS — C44319 Basal cell carcinoma of skin of other parts of face: Secondary | ICD-10-CM | POA: Diagnosis not present

## 2016-05-23 DIAGNOSIS — Z85828 Personal history of other malignant neoplasm of skin: Secondary | ICD-10-CM | POA: Diagnosis not present

## 2016-05-23 DIAGNOSIS — D225 Melanocytic nevi of trunk: Secondary | ICD-10-CM | POA: Diagnosis not present

## 2016-05-23 DIAGNOSIS — L570 Actinic keratosis: Secondary | ICD-10-CM | POA: Diagnosis not present

## 2016-05-23 DIAGNOSIS — C44519 Basal cell carcinoma of skin of other part of trunk: Secondary | ICD-10-CM | POA: Diagnosis not present

## 2016-05-23 DIAGNOSIS — D485 Neoplasm of uncertain behavior of skin: Secondary | ICD-10-CM | POA: Diagnosis not present

## 2016-05-23 DIAGNOSIS — D1801 Hemangioma of skin and subcutaneous tissue: Secondary | ICD-10-CM | POA: Diagnosis not present

## 2016-05-24 ENCOUNTER — Ambulatory Visit (INDEPENDENT_AMBULATORY_CARE_PROVIDER_SITE_OTHER): Payer: Medicare Other | Admitting: *Deleted

## 2016-05-24 DIAGNOSIS — I480 Paroxysmal atrial fibrillation: Secondary | ICD-10-CM | POA: Diagnosis not present

## 2016-05-24 DIAGNOSIS — I4891 Unspecified atrial fibrillation: Secondary | ICD-10-CM | POA: Diagnosis not present

## 2016-05-24 LAB — POCT INR: INR: 2

## 2016-06-06 ENCOUNTER — Ambulatory Visit (INDEPENDENT_AMBULATORY_CARE_PROVIDER_SITE_OTHER): Payer: Medicare Other | Admitting: Internal Medicine

## 2016-06-06 ENCOUNTER — Encounter: Payer: Self-pay | Admitting: Internal Medicine

## 2016-06-06 ENCOUNTER — Encounter (INDEPENDENT_AMBULATORY_CARE_PROVIDER_SITE_OTHER): Payer: Self-pay

## 2016-06-06 VITALS — BP 124/72 | HR 94 | Ht 70.0 in | Wt 282.0 lb

## 2016-06-06 DIAGNOSIS — Z7901 Long term (current) use of anticoagulants: Secondary | ICD-10-CM | POA: Diagnosis not present

## 2016-06-06 DIAGNOSIS — I2510 Atherosclerotic heart disease of native coronary artery without angina pectoris: Secondary | ICD-10-CM | POA: Diagnosis not present

## 2016-06-06 DIAGNOSIS — R0609 Other forms of dyspnea: Secondary | ICD-10-CM

## 2016-06-06 DIAGNOSIS — Z9861 Coronary angioplasty status: Secondary | ICD-10-CM | POA: Diagnosis not present

## 2016-06-06 DIAGNOSIS — Z85828 Personal history of other malignant neoplasm of skin: Secondary | ICD-10-CM | POA: Diagnosis not present

## 2016-06-06 DIAGNOSIS — C44319 Basal cell carcinoma of skin of other parts of face: Secondary | ICD-10-CM | POA: Diagnosis not present

## 2016-06-06 LAB — CBC
HCT: 43.5 % (ref 38.5–50.0)
Hemoglobin: 14.8 g/dL (ref 13.2–17.1)
MCH: 30.6 pg (ref 27.0–33.0)
MCHC: 34 g/dL (ref 32.0–36.0)
MCV: 89.9 fL (ref 80.0–100.0)
MPV: 10.4 fL (ref 7.5–12.5)
PLATELETS: 243 10*3/uL (ref 140–400)
RBC: 4.84 MIL/uL (ref 4.20–5.80)
RDW: 13.5 % (ref 11.0–15.0)
WBC: 7.7 10*3/uL (ref 3.8–10.8)

## 2016-06-06 LAB — TSH: TSH: 4.21 m[IU]/L (ref 0.40–4.50)

## 2016-06-06 NOTE — Patient Instructions (Addendum)
Your physician recommends that you continue on your current medications as directed. Please refer to the Current Medication list given to you today.  Your physician recommends that you return for lab work CBC, BMET, BNP, TSH Your physician has requested that you have an echocardiogram. Echocardiography is a painless test that uses sound waves to create images of your heart. It provides your doctor with information about the size and shape of your heart and how well your heart's chambers and valves are working. This procedure takes approximately one hour. There are no restrictions for this procedure.  Your physician recommends that you schedule a follow-up appointment in: December, 2017 with Dr. Harrington Challenger.

## 2016-06-06 NOTE — Progress Notes (Signed)
Cardiology Office Note   Date:  06/06/2016   ID:  Levi [name], DOB [birth year], MRN [id number]  PCP:  [name], [name]  Cardiologist:   [name], [name]    F/U of CAD and PPM     History of Present Illness: Levi [name] is a 82 y.o. male with a history of CAD (s/p CABG in 2011; DES to LAD in 2014)  Also ahistory of PAF (CHADSVASc 4), SB, LAFB, RBBB  And SOB  He was seen by Levi [name] in Jan 2017  Underwent PPM placement in Feb ([name]) with lead revision on Feb 3.   After pacer was placed he was feeling much better   But now he says his energy is down  He gives out wit hactivity  Notes increased swelling       Outpatient Medications Prior to Visit  Medication Sig Dispense Refill  . amLODipine (NORVASC) 2.5 MG tablet Take 1 tablet by mouth  twice a day 180 tablet 0  . aspirin 81 MG tablet Take 1 tablet (81 mg total) by mouth at bedtime. 30 tablet   . famotidine (PEPCID) 20 MG tablet Take 20 mg by mouth daily.    . furosemide (LASIX) 20 MG tablet Take 1 tablet (20 mg total) by mouth daily. 90 tablet 1  . isosorbide mononitrate (IMDUR) 60 MG 24 hr tablet Take one-half tablet by  mouth daily 45 tablet 2  . losartan (COZAAR) 100 MG tablet Take 50 mg by mouth 2 (two) times daily.    . nitroGLYCERIN (NITROSTAT) 0.4 MG SL tablet Place 0.4 mg under the tongue every 5 (five) minutes x 3 doses as needed for chest pain.    . polyethylene glycol powder (GLYCOLAX/MIRALAX) powder Take 1 Container by mouth daily.     . pravastatin (PRAVACHOL) 20 MG tablet Take 1 tablet by mouth  daily 90 tablet 0  . spironolactone (ALDACTONE) 25 MG tablet Take 1 tablet (25 mg total) by mouth daily. 30 tablet 6  . warfarin (COUMADIN) 5 MG tablet Take 1 tablet by mouth  daily 90 tablet 0   No facility-administered medications prior to visit.      Allergies:   Review of patient's allergies indicates no known allergies.   Past Medical History:  Diagnosis Date  . Asthma   . Benign neoplasm of colon   .  CAD (coronary artery disease) Dec 2011   s/p CABG per Levi [name]; had normal EF; All SVGs occluded per follow up cath with only LIMA to LAD patent; s/p PCI of the proximal and mid LAD February 2014 per Levi [name]  . Dyspnea on exertion   . Edema   . Emphysema    "never treated for it" (10/05/2015)  . History of hiatal hernia   . HLD (hyperlipidemia)   . HTN (hypertension)   . Malaise and fatigue   . Obesity   . Osteopenia   . Pneumonia 1960s X 1; 1990s X 1  . Presence of permanent cardiac pacemaker   . Prostate cancer ([place])   . Skin cancer    "face, nose, top of ears, scalp"  . Symptomatic bradycardia    STJ PPM, 10/05/15, Levi [name]    Past Surgical History:  Procedure Laterality Date  . ANTERIOR CERVICAL DECOMP/DISCECTOMY FUSION    . BACK SURGERY    . CATARACT EXTRACTION Right   . CORONARY ANGIOPLASTY WITH STENT PLACEMENT  10/14/12   with stent to proximal and mid LAD  .  CORONARY ARTERY BYPASS GRAFT  08/23/10   "CABG X4"  . EP IMPLANTABLE DEVICE N/A 10/05/2015   Procedure: Pacemaker Implant;  Surgeon: Levi [name];  Location: [place] [facility];  Service: Cardiovascular;  Laterality: N/A;  . EP IMPLANTABLE DEVICE N/A 10/06/2015   Procedure: Lead Revision/Repair;  Surgeon: Levi [name];  Location: [place] [facility];  Service: Cardiovascular;  Laterality: N/A;  . INSERT / REPLACE / REMOVE PACEMAKER  10/05/2015  . LEFT HEART CATHETERIZATION WITH CORONARY/GRAFT ANGIOGRAM N/A 09/01/2012   Procedure: LEFT HEART CATHETERIZATION WITH Levi [name];  Surgeon: Levi [name];  Location: [facility] CATH LAB;  Service: Cardiovascular;  Laterality: N/A;  . MEDIAN STERNOTOMY  08/23/10  . MOHS SURGERY  "@ least twice"  . PACEMAKER INSERTION     STJ 10/05/15  . PERCUTANEOUS CORONARY STENT INTERVENTION (PCI-S) N/A 10/14/2012   Procedure: PERCUTANEOUS CORONARY STENT INTERVENTION (PCI-S);  Surgeon: Levi [name];  Location: [facility] CATH LAB;  Service: Cardiovascular;  Laterality:  N/A;  . PROSTATE BIOPSY    . TRANSURETHRAL RESECTION OF PROSTATE       Social History:  The patient  reports that he quit smoking about 40 years ago. His smoking use included Cigarettes. He has a 30.00 pack-year smoking history. He quit smokeless tobacco use about 10 months ago. His smokeless tobacco use included Chew. He reports that he does not drink alcohol or use drugs.   Family History:  The patient's family history includes Heart attack in his father; Heart disease in his brother; Hypertension in his mother; Kidney cancer in his father; Prostate cancer in his brother; Skin cancer in his brother; Stroke in his mother.    ROS:  Please see the history of present illness. All other systems are reviewed and  Negative to the above problem except as noted.    PHYSICAL EXAM: VS:  BP 124/72   Pulse 94   Ht '5\' 10"'$  (1.778 m)   Wt 282 lb (127.9 kg)   BMI 40.46 kg/m   GEN: Well nourished, well developed, in no acute distress  HEENT: normal  Neck: Neck full  JVP is increased  , carotid bruits, or masses Cardiac: RRR; no murmurs, rubs, or gallops,1+ edema  Respiratory:  clear to auscultation bilaterally, normal work of breathing GI: soft, nontender, nondistended, + BS  No hepatomegaly  MS: no deformity Moving all extremities   Skin: warm and dry, no rash Neuro:  Strength and sensation are intact Psych: euthymic mood, full affect   EKG:  EKG is NOt ordered today. Pacer interrogation done    Lipid Panel    Component Value Date/Time   CHOL 113 06/01/2014 0930   TRIG 115.0 06/01/2014 0930   HDL 32.40 (Levi) 06/01/2014 0930   CHOLHDL 3 06/01/2014 0930   VLDL 23.0 06/01/2014 0930   LDLCALC 58 06/01/2014 0930   LDLDIRECT 70.7 06/10/2013 1344      Wt Readings from Last 3 Encounters:  06/06/16 282 lb (127.9 kg)  01/19/16 274 lb (124.3 kg)  11/03/15 275 lb 3.2 oz (124.8 kg)      ASSESSMENT AND PLAN:  1  Fatigue/ SOB   Exam shows increased volume  Pacer interrogation shows pt is  paced almost 100% of the time  Levi get CBC, BMET, BNP, TSH  Levi aslo set up for echo to reeval LVEF  2  CAD  Follow  I am not convinced o active angina.     Current medicines are reviewed at length  with the patient today.  The patient does not have concerns regarding medicines.  Signed, [name], [name]  06/06/2016 2:03 PM    [address], [place], [place]  [address] Phone: [phone]; Fax: [phone]

## 2016-06-07 LAB — BASIC METABOLIC PANEL
BUN: 16 mg/dL (ref 7–25)
CALCIUM: 8.8 mg/dL (ref 8.6–10.3)
CO2: 27 mmol/L (ref 20–31)
CREATININE: 1.27 mg/dL — AB (ref 0.70–1.11)
Chloride: 104 mmol/L (ref 98–110)
Glucose, Bld: 100 mg/dL — ABNORMAL HIGH (ref 65–99)
Potassium: 4.4 mmol/L (ref 3.5–5.3)
Sodium: 140 mmol/L (ref 135–146)

## 2016-06-07 LAB — BRAIN NATRIURETIC PEPTIDE: Brain Natriuretic Peptide: 90.5 pg/mL (ref <100)

## 2016-06-11 DIAGNOSIS — H2512 Age-related nuclear cataract, left eye: Secondary | ICD-10-CM | POA: Diagnosis not present

## 2016-06-13 DIAGNOSIS — L570 Actinic keratosis: Secondary | ICD-10-CM | POA: Diagnosis not present

## 2016-06-17 DIAGNOSIS — H2512 Age-related nuclear cataract, left eye: Secondary | ICD-10-CM | POA: Diagnosis not present

## 2016-06-20 ENCOUNTER — Other Ambulatory Visit: Payer: Self-pay

## 2016-06-20 ENCOUNTER — Ambulatory Visit (INDEPENDENT_AMBULATORY_CARE_PROVIDER_SITE_OTHER): Payer: Medicare Other

## 2016-06-20 ENCOUNTER — Ambulatory Visit (HOSPITAL_COMMUNITY): Payer: Medicare Other | Attending: Cardiovascular Disease

## 2016-06-20 DIAGNOSIS — R0609 Other forms of dyspnea: Secondary | ICD-10-CM | POA: Diagnosis not present

## 2016-06-20 DIAGNOSIS — Z951 Presence of aortocoronary bypass graft: Secondary | ICD-10-CM | POA: Diagnosis not present

## 2016-06-20 DIAGNOSIS — I480 Paroxysmal atrial fibrillation: Secondary | ICD-10-CM

## 2016-06-20 DIAGNOSIS — Z6841 Body Mass Index (BMI) 40.0 and over, adult: Secondary | ICD-10-CM | POA: Insufficient documentation

## 2016-06-20 DIAGNOSIS — E669 Obesity, unspecified: Secondary | ICD-10-CM | POA: Diagnosis not present

## 2016-06-20 DIAGNOSIS — I4891 Unspecified atrial fibrillation: Secondary | ICD-10-CM | POA: Diagnosis not present

## 2016-06-20 DIAGNOSIS — I2510 Atherosclerotic heart disease of native coronary artery without angina pectoris: Secondary | ICD-10-CM | POA: Insufficient documentation

## 2016-06-20 DIAGNOSIS — Z7901 Long term (current) use of anticoagulants: Secondary | ICD-10-CM | POA: Diagnosis not present

## 2016-06-20 DIAGNOSIS — Z9861 Coronary angioplasty status: Secondary | ICD-10-CM | POA: Diagnosis not present

## 2016-06-20 LAB — POCT INR: INR: 2.8

## 2016-06-26 ENCOUNTER — Telehealth: Payer: Self-pay | Admitting: Internal Medicine

## 2016-06-26 NOTE — Telephone Encounter (Signed)
Patient's wife notified of echo results.  She said today in the grocery store he had been walking around, got really warm and weak and took off his coat and then went to the car.   Symptoms resolved in the car.  He drove home and seems to be fine ever since.   She thinks he looked ok, and had no other complaints but warm and felt weak.  Currently he "stepped out" so I was unable to speak with patient.  I asked her to call back if this occurs again and advised I will forward to Dr. Harrington Challenger to make aware.

## 2016-06-26 NOTE — Telephone Encounter (Signed)
returning the nurse call

## 2016-06-26 NOTE — Telephone Encounter (Signed)
New message ° ° ° ° °Returning a call to the nurse °

## 2016-06-26 NOTE — Telephone Encounter (Signed)
REviewed  Agree with recommendation

## 2016-07-06 ENCOUNTER — Other Ambulatory Visit: Payer: Self-pay | Admitting: Internal Medicine

## 2016-07-18 ENCOUNTER — Encounter (INDEPENDENT_AMBULATORY_CARE_PROVIDER_SITE_OTHER): Payer: Self-pay

## 2016-07-18 ENCOUNTER — Ambulatory Visit (INDEPENDENT_AMBULATORY_CARE_PROVIDER_SITE_OTHER): Payer: Medicare Other | Admitting: *Deleted

## 2016-07-18 DIAGNOSIS — Z9861 Coronary angioplasty status: Secondary | ICD-10-CM

## 2016-07-18 DIAGNOSIS — I2510 Atherosclerotic heart disease of native coronary artery without angina pectoris: Secondary | ICD-10-CM

## 2016-07-18 DIAGNOSIS — I4891 Unspecified atrial fibrillation: Secondary | ICD-10-CM | POA: Diagnosis not present

## 2016-07-18 DIAGNOSIS — I480 Paroxysmal atrial fibrillation: Secondary | ICD-10-CM | POA: Diagnosis not present

## 2016-07-18 LAB — POCT INR: INR: 1.8

## 2016-07-22 ENCOUNTER — Ambulatory Visit (INDEPENDENT_AMBULATORY_CARE_PROVIDER_SITE_OTHER): Payer: Medicare Other | Admitting: *Deleted

## 2016-07-22 DIAGNOSIS — I441 Atrioventricular block, second degree: Secondary | ICD-10-CM

## 2016-07-23 NOTE — Progress Notes (Signed)
Remote pacemaker transmission.   

## 2016-07-24 ENCOUNTER — Encounter: Payer: Self-pay | Admitting: Cardiology

## 2016-07-26 ENCOUNTER — Other Ambulatory Visit: Payer: Self-pay | Admitting: Internal Medicine

## 2016-08-01 ENCOUNTER — Ambulatory Visit (INDEPENDENT_AMBULATORY_CARE_PROVIDER_SITE_OTHER): Payer: Medicare Other | Admitting: *Deleted

## 2016-08-01 DIAGNOSIS — I480 Paroxysmal atrial fibrillation: Secondary | ICD-10-CM

## 2016-08-01 DIAGNOSIS — I4891 Unspecified atrial fibrillation: Secondary | ICD-10-CM

## 2016-08-01 DIAGNOSIS — I2510 Atherosclerotic heart disease of native coronary artery without angina pectoris: Secondary | ICD-10-CM

## 2016-08-01 DIAGNOSIS — Z9861 Coronary angioplasty status: Secondary | ICD-10-CM

## 2016-08-01 LAB — POCT INR: INR: 3.6

## 2016-08-14 LAB — CUP PACEART REMOTE DEVICE CHECK
Battery Remaining Percentage: 95.5 %
Battery Voltage: 2.99 V
Brady Statistic AP VP Percent: 89 %
Brady Statistic AS VS Percent: 1 %
Brady Statistic RA Percent Paced: 88 %
Implantable Lead Implant Date: 20170202
Implantable Lead Implant Date: 20170202
Implantable Lead Location: 753860
Implantable Pulse Generator Implant Date: 20170202
Lead Channel Impedance Value: 540 Ohm
Lead Channel Pacing Threshold Amplitude: 1 V
Lead Channel Pacing Threshold Pulse Width: 0.4 ms
Lead Channel Pacing Threshold Pulse Width: 0.4 ms
Lead Channel Sensing Intrinsic Amplitude: 2.5 mV
Lead Channel Setting Pacing Amplitude: 1.125
Lead Channel Setting Sensing Sensitivity: 2 mV
MDC IDC LEAD LOCATION: 753859
MDC IDC MSMT BATTERY REMAINING LONGEVITY: 100 mo
MDC IDC MSMT LEADCHNL RA IMPEDANCE VALUE: 440 Ohm
MDC IDC MSMT LEADCHNL RV PACING THRESHOLD AMPLITUDE: 0.875 V
MDC IDC MSMT LEADCHNL RV SENSING INTR AMPL: 12 mV
MDC IDC PG SERIAL: 7864186
MDC IDC SESS DTM: 20171120070020
MDC IDC SET LEADCHNL RA PACING AMPLITUDE: 2.5 V
MDC IDC SET LEADCHNL RV PACING PULSEWIDTH: 0.4 ms
MDC IDC STAT BRADY AP VS PERCENT: 1.3 %
MDC IDC STAT BRADY AS VP PERCENT: 9 %
MDC IDC STAT BRADY RV PERCENT PACED: 98 %

## 2016-08-17 ENCOUNTER — Other Ambulatory Visit: Payer: Self-pay | Admitting: Internal Medicine

## 2016-08-19 ENCOUNTER — Encounter: Payer: Self-pay | Admitting: Internal Medicine

## 2016-08-19 ENCOUNTER — Encounter (INDEPENDENT_AMBULATORY_CARE_PROVIDER_SITE_OTHER): Payer: Self-pay

## 2016-08-19 ENCOUNTER — Ambulatory Visit (INDEPENDENT_AMBULATORY_CARE_PROVIDER_SITE_OTHER): Payer: Medicare Other | Admitting: Internal Medicine

## 2016-08-19 ENCOUNTER — Ambulatory Visit (INDEPENDENT_AMBULATORY_CARE_PROVIDER_SITE_OTHER): Payer: Medicare Other

## 2016-08-19 VITALS — BP 126/74 | HR 71 | Ht 70.0 in | Wt 281.0 lb

## 2016-08-19 DIAGNOSIS — Z9861 Coronary angioplasty status: Secondary | ICD-10-CM | POA: Diagnosis not present

## 2016-08-19 DIAGNOSIS — I4891 Unspecified atrial fibrillation: Secondary | ICD-10-CM

## 2016-08-19 DIAGNOSIS — I2510 Atherosclerotic heart disease of native coronary artery without angina pectoris: Secondary | ICD-10-CM | POA: Diagnosis not present

## 2016-08-19 DIAGNOSIS — I25810 Atherosclerosis of coronary artery bypass graft(s) without angina pectoris: Secondary | ICD-10-CM | POA: Diagnosis not present

## 2016-08-19 DIAGNOSIS — R0600 Dyspnea, unspecified: Secondary | ICD-10-CM | POA: Diagnosis not present

## 2016-08-19 DIAGNOSIS — I480 Paroxysmal atrial fibrillation: Secondary | ICD-10-CM

## 2016-08-19 LAB — POCT INR: INR: 2.2

## 2016-08-19 NOTE — Progress Notes (Signed)
Cardiology Office Note   Date:  08/19/2016   ID:  KENZEL RUESCH, DOB 1928/09/18, MRN [id number]  PCP:  [name], MD  Cardiologist:   [name], MD    F?U of CAD     History of Present Illness: Levi [name] is a 82 y.o. male with a history of  CAD (s/p CABG in 2011; DES to LAD in 2014) Also ahistory of PAF (CHADSVASc 4), SB, LAFB, RBBB And SOB He was seen by [name] in Jan 2017 Underwent PPM placement in Feb ([place]) with lead revision on Feb 3.  After pacer was placed he was feeling much better   But now he says his energy is down  He gives out wit hactivity  Notes increased swelling        Current Meds  Medication Sig  . amLODipine (NORVASC) 2.5 MG tablet TAKE 1 TABLET BY MOUTH  TWICE A DAY  . aspirin 81 MG tablet Take 1 tablet (81 mg total) by mouth at bedtime.  . famotidine (PEPCID) 20 MG tablet Take 20 mg by mouth daily.  . furosemide (LASIX) 20 MG tablet Take 1 tablet (20 mg total) by mouth daily.  . isosorbide mononitrate (IMDUR) 60 MG 24 hr tablet TAKE ONE-HALF TABLET BY  MOUTH DAILY  . losartan (COZAAR) 100 MG tablet Take 50 mg by mouth 2 (two) times daily.  . nitroGLYCERIN (NITROSTAT) 0.4 MG SL tablet Place 0.4 mg under the tongue every 5 (five) minutes x 3 doses as needed for chest pain.  . polyethylene glycol powder (GLYCOLAX/MIRALAX) powder Take 1 Container by mouth daily.   . pravastatin (PRAVACHOL) 20 MG tablet TAKE 1 TABLET BY MOUTH  DAILY  . spironolactone (ALDACTONE) 25 MG tablet Take 1 tablet (25 mg total) by mouth daily.  Marland Kitchen warfarin (COUMADIN) 5 MG tablet TAKE 1 TABLET BY MOUTH  DAILY     Allergies:   Patient has no known allergies.   Past Medical History:  Diagnosis Date  . Asthma   . Benign neoplasm of colon   . CAD (coronary artery disease) Dec 2011   s/p CABG per Dr. Roxy Manns; had normal EF; All SVGs occluded per follow up cath with only LIMA to LAD patent; s/p PCI of the proximal and mid LAD February 2014 per Dr. Burt Knack  .  Dyspnea on exertion   . Edema   . Emphysema    "never treated for it" (10/05/2015)  . History of hiatal hernia   . HLD (hyperlipidemia)   . HTN (hypertension)   . Malaise and fatigue   . Obesity   . Osteopenia   . Pneumonia 1960s X 1; 1990s X 1  . Presence of permanent cardiac pacemaker   . Prostate cancer ([place])   . Skin cancer    "face, nose, top of ears, scalp"  . Symptomatic bradycardia    STJ PPM, 10/05/15, Dr. Lovena Le    Past Surgical History:  Procedure Laterality Date  . ANTERIOR CERVICAL DECOMP/DISCECTOMY FUSION    . BACK SURGERY    . CATARACT EXTRACTION Right   . CORONARY ANGIOPLASTY WITH STENT PLACEMENT  10/14/12   with stent to proximal and mid LAD  . CORONARY ARTERY BYPASS GRAFT  08/23/10   "CABG X4"  . EP IMPLANTABLE DEVICE N/A 10/05/2015   Procedure: Pacemaker Implant;  Surgeon: [name], MD;  Location: [place] [facility];  Service: Cardiovascular;  Laterality: N/A;  . EP IMPLANTABLE DEVICE N/A 10/06/2015   Procedure: Lead  Revision/Repair;  Surgeon: [name], MD;  Location: [place] [facility];  Service: Cardiovascular;  Laterality: N/A;  . INSERT / REPLACE / REMOVE PACEMAKER  10/05/2015  . LEFT HEART CATHETERIZATION WITH CORONARY/GRAFT ANGIOGRAM N/A 09/01/2012   Procedure: LEFT HEART CATHETERIZATION WITH [name];  Surgeon: [name], MD;  Location: [facility] CATH LAB;  Service: Cardiovascular;  Laterality: N/A;  . MEDIAN STERNOTOMY  08/23/10  . MOHS SURGERY  "@ least twice"  . PACEMAKER INSERTION     STJ 10/05/15  . PERCUTANEOUS CORONARY STENT INTERVENTION (PCI-S) N/A 10/14/2012   Procedure: PERCUTANEOUS CORONARY STENT INTERVENTION (PCI-S);  Surgeon: [name], MD;  Location[facility] C[facility] LAB;  Service: Cardiovascular;  Laterality: N/A;  . PROSTATE BIOPSY    . TRANSURETHRAL RESECTION OF PROSTATE       Social History:  The patient  reports that he quit smoking about 40 years ago. His smoking use included Cigarettes. He has a 30.00 pack-year  smoking history. He quit smokeless tobacco use about 12 months ago. His smokeless tobacco use included Chew. He reports that he does not drink alcohol or use drugs.   Family History:  The patient's family history includes Heart attack in his father; Heart disease in his brother; Hypertension in his mother; Kidney cancer in his father; Prostate cancer in his brother; Skin cancer in his brother; Stroke in his mother.    ROS:  Please see the history of present illness. All other systems are reviewed and  Negative to the above problem except as noted.    PHYSICAL EXAM: VS:  BP 126/74   Pulse 71   Ht '5\' 10"'$  (1.778 m)   Wt 281 lb (127.5 kg)   SpO2 98%   BMI 40.32 kg/m   CHE:NIDPO 82 yo  in no acute distress  HEENT: normal  Neck: no JVD, carotid bruits, or masses Cardiac: RRR; no murmurs, rubs, or gallops,1+ edema  Respiratory:  clear to auscultation bilaterally, normal work of breathing GI: soft, nontender, nondistended, + BS  No hepatomegaly  MS: no deformity Moving all extremities   Skin: warm and dry, no rash Neuro:  Strength and sensation are intact Psych: euthymic mood, full affect   EKG:  EKG is not  ordered today.   Lipid Panel    Component Value Date/Time   CHOL 113 06/01/2014 0930   TRIG 115.0 06/01/2014 0930   HDL 32.40 (L) 06/01/2014 0930   CHOLHDL 3 06/01/2014 0930   VLDL 23.0 06/01/2014 0930   LDLCALC 58 06/01/2014 0930   LDLDIRECT 70.7 06/10/2013 1344      Wt Readings from Last 3 Encounters:  08/19/16 281 lb (127.5 kg)  06/06/16 282 lb (127.9 kg)  01/19/16 274 lb (124.3 kg)      ASSESSMENT AND PLAN:  1  Dyspnea  Difficult  Pt said after last coronary intervention he did not feel better  Did notice some increased energy after PPM  Now down  I am not completely eonvinced of active ischemia  Wt is up some  ? Volume  Exam is diffiuclt  Will review  Get back in touch with pt   2  CAD  As above Again, not convinced of worsening ischemia   Current medicines  are reviewed at length with the patient today.  The patient does not have concerns regarding medicines.  Signed, [name], MD  08/19/2016 10:41 AM    [address], [place], [place]  [address] Phone: [phone];  Fax: [phone]

## 2016-08-19 NOTE — Patient Instructions (Signed)
Your physician recommends that you continue on your current medications as directed. Please refer to the Current Medication list given to you today.     

## 2016-08-22 ENCOUNTER — Telehealth: Payer: Self-pay | Admitting: Internal Medicine

## 2016-08-22 NOTE — Telephone Encounter (Signed)
Spoke with pt's wife After review I question if if volume is increased   I would recomm taking 2 lasix every other day for 3 doses  40-20-40-20-40  Call with response  Follow wt. No plans for cardiac testing (cath, stress test) for now.   She is agreeable

## 2016-08-24 ENCOUNTER — Other Ambulatory Visit: Payer: Self-pay | Admitting: Internal Medicine

## 2016-09-09 DIAGNOSIS — J069 Acute upper respiratory infection, unspecified: Secondary | ICD-10-CM | POA: Diagnosis not present

## 2016-09-16 ENCOUNTER — Ambulatory Visit (INDEPENDENT_AMBULATORY_CARE_PROVIDER_SITE_OTHER): Payer: Medicare Other | Admitting: Pharmacist

## 2016-09-16 DIAGNOSIS — I4891 Unspecified atrial fibrillation: Secondary | ICD-10-CM | POA: Diagnosis not present

## 2016-09-16 DIAGNOSIS — I480 Paroxysmal atrial fibrillation: Secondary | ICD-10-CM | POA: Diagnosis not present

## 2016-09-16 LAB — POCT INR: INR: 1.7

## 2016-10-11 ENCOUNTER — Encounter: Payer: Self-pay | Admitting: Internal Medicine

## 2016-10-12 ENCOUNTER — Other Ambulatory Visit: Payer: Self-pay | Admitting: Internal Medicine

## 2016-10-16 ENCOUNTER — Ambulatory Visit (INDEPENDENT_AMBULATORY_CARE_PROVIDER_SITE_OTHER): Payer: Medicare Other | Admitting: *Deleted

## 2016-10-16 ENCOUNTER — Encounter (INDEPENDENT_AMBULATORY_CARE_PROVIDER_SITE_OTHER): Payer: Self-pay

## 2016-10-16 ENCOUNTER — Ambulatory Visit (INDEPENDENT_AMBULATORY_CARE_PROVIDER_SITE_OTHER): Payer: Medicare Other | Admitting: Internal Medicine

## 2016-10-16 ENCOUNTER — Encounter: Payer: Self-pay | Admitting: Internal Medicine

## 2016-10-16 VITALS — BP 136/70 | HR 90 | Ht 70.0 in | Wt 280.8 lb

## 2016-10-16 DIAGNOSIS — Z9861 Coronary angioplasty status: Secondary | ICD-10-CM | POA: Diagnosis not present

## 2016-10-16 DIAGNOSIS — I4891 Unspecified atrial fibrillation: Secondary | ICD-10-CM | POA: Diagnosis not present

## 2016-10-16 DIAGNOSIS — I2510 Atherosclerotic heart disease of native coronary artery without angina pectoris: Secondary | ICD-10-CM

## 2016-10-16 DIAGNOSIS — I480 Paroxysmal atrial fibrillation: Secondary | ICD-10-CM

## 2016-10-16 DIAGNOSIS — Z950 Presence of cardiac pacemaker: Secondary | ICD-10-CM

## 2016-10-16 LAB — POCT INR: INR: 3.3

## 2016-10-16 NOTE — Patient Instructions (Addendum)
Medication Instructions:  Your physician recommends that you continue on your current medications as directed. Please refer to the Current Medication list given to you today.   Labwork: None Ordered   Testing/Procedures: None Ordered   Follow-Up: Your physician wants you to follow-up in: 1 year with Dr. Lovena Le. You will receive a reminder letter in the mail two months in advance. If you don't receive a letter, please call our office to schedule the follow-up appointment.  Remote monitoring is used to monitor your Pacemaker from home. This monitoring reduces the number of office visits required to check your device to one time per year. It allows [place] to keep an eye on the functioning of your device to ensure it is working properly. You are scheduled for a device check from home on 01/15/17. You may send your transmission at any time that day. If you have a wireless device, the transmission will be sent automatically. After your physician reviews your transmission, you will receive a postcard with your next transmission date.      Any Other Special Instructions Will Be Listed Below (If Applicable). Dr. Lovena Le will speak with Dr. Burt Knack and Dr. Harrington Challenger regarding heart cath.    If you need a refill on your cardiac medications before your next appointment, please call your pharmacy.

## 2016-10-16 NOTE — Progress Notes (Signed)
HPI [name] returns today for followup. He is a pleasant obese 83 yo man with a h/o CAD, s/p CABG, HTN who was found to have symptomatic 2:1 AV block and underwent PPM insertion in February complicated by PPM lead dislodgement. He was revised successfully and returns today for followup. In the interim, he has worsened.  No chest pain. Today he complains of worsening sob so that he cannot walk across the room, dress himself or take a shower without having to stop and rest. No chest pain but he did not have chest pain before he had CABG.  He has been unable to lose weight.  No Known Allergies   Current Outpatient Prescriptions  Medication Sig Dispense Refill  . amLODipine (NORVASC) 2.5 MG tablet TAKE 1 TABLET BY MOUTH  TWICE A DAY 180 tablet 2  . aspirin 81 MG tablet Take 1 tablet (81 mg total) by mouth at bedtime. 30 tablet   . famotidine (PEPCID) 20 MG tablet Take 20 mg by mouth daily.    . furosemide (LASIX) 20 MG tablet Take 1 tablet (20 mg total) by mouth daily. 90 tablet 3  . isosorbide mononitrate (IMDUR) 60 MG 24 hr tablet TAKE ONE-HALF TABLET BY  MOUTH DAILY 45 tablet 2  . losartan (COZAAR) 100 MG tablet Take 50 mg by mouth 2 (two) times daily.    . nitroGLYCERIN (NITROSTAT) 0.4 MG SL tablet Place 0.4 mg under the tongue every 5 (five) minutes x 3 doses as needed for chest pain.    . polyethylene glycol powder (GLYCOLAX/MIRALAX) powder Take 1 Container by mouth daily.     . pravastatin (PRAVACHOL) 20 MG tablet TAKE 1 TABLET BY MOUTH  DAILY 90 tablet 2  . spironolactone (ALDACTONE) 25 MG tablet Take 1 tablet (25 mg total) by mouth daily. 30 tablet 6  . warfarin (COUMADIN) 5 MG tablet TAKE 1 TABLET BY MOUTH  DAILY 90 tablet 0   No current facility-administered medications for this visit.      Past Medical History:  Diagnosis Date  . Asthma   . Benign neoplasm of colon   . CAD (coronary artery disease) Dec 2011   s/p CABG per Dr. Roxy Manns; had normal EF; All SVGs occluded  per follow up cath with only LIMA to LAD patent; s/p PCI of the proximal and mid LAD February 2014 per Dr. Burt Knack  . Dyspnea on exertion   . Edema   . Emphysema    "never treated for it" (10/05/2015)  . History of hiatal hernia   . HLD (hyperlipidemia)   . HTN (hypertension)   . Malaise and fatigue   . Obesity   . Osteopenia   . Pneumonia 1960s X 1; 1990s X 1  . Presence of permanent cardiac pacemaker   . Prostate cancer ([place])   . Skin cancer    "face, nose, top of ears, scalp"  . Symptomatic bradycardia    STJ PPM, 10/05/15, Dr. Lovena Le    ROS:   All systems reviewed and negative except as noted in the HPI.   Past Surgical History:  Procedure Laterality Date  . ANTERIOR CERVICAL DECOMP/DISCECTOMY FUSION    . BACK SURGERY    . CATARACT EXTRACTION Right   . CORONARY ANGIOPLASTY WITH STENT PLACEMENT  10/14/12   with stent to proximal and mid LAD  . CORONARY ARTERY BYPASS GRAFT  08/23/10   "CABG X4"  . EP IMPLANTABLE DEVICE N/A 10/05/2015   Procedure: Pacemaker Implant;  Surgeon:  [name], MD;  Location: [place] CV LAB;  Service: Cardiovascular;  Laterality: N/A;  . EP IMPLANTABLE DEVICE N/A 10/06/2015   Procedure: Lead Revision/Repair;  Surgeon: [name], MD;  Location: [place] [facility] LAB;  Service: Cardiovascular;  Laterality: N/A;  . INSERT / REPLACE / REMOVE PACEMAKER  10/05/2015  . LEFT HEART CATHETERIZATION WITH CORONARY/GRAFT ANGIOGRAM N/A 09/01/2012   Procedure: LEFT HEART CATHETERIZATION WITH [name];  Surgeon: [name], MD;  Location: [facility] C[facility] LAB;  Service: Cardiovascular;  Laterality: N/A;  . MEDIAN STERNOTOMY  08/23/10  . MOHS SURGERY  "@ least twice"  . PACEMAKER INSERTION     STJ 10/05/15  . PERCUTANEOUS CORONARY STENT INTERVENTION (PCI-S) N/A 10/14/2012   Procedure: PERCUTANEOUS CORONARY STENT INTERVENTION (PCI-S);  Surgeon: [name], MD;  Location: [facility] CATH LAB;  Service: Cardiovascular;  Laterality: N/A;  . PROSTATE  BIOPSY    . TRANSURETHRAL RESECTION OF PROSTATE       Family History  Problem Relation Age of Onset  . Kidney cancer Father   . Heart attack Father   . Hypertension Mother   . Stroke Mother   . Heart disease Brother   . Skin cancer Brother   . Prostate cancer Brother      Social History   Social History  . Marital status: Married    Spouse name: N/A  . Number of children: N/A  . Years of education: N/A   Occupational History  . retired Furniture conservator/restorer Retired   Social History Main Topics  . Smoking status: Former Smoker    Packs/day: 1.00    Years: 30.00    Types: Cigarettes    Quit date: 09/07/1975  . Smokeless tobacco: Former Systems developer    Types: [place] date: 07/27/2015  . Alcohol use No  . Drug use: No  . Sexual activity: Not Currently   Other Topics Concern  . Not on file   Social History Narrative  . No narrative on file     BP 136/70   Pulse 90   Ht '5\' 10"'$  (1.778 m)   Wt 280 lb 12.8 oz (127.4 kg)   SpO2 96%   BMI 40.29 kg/m   Physical Exam:  stable appearing obese elderly man, NAD HEENT: Unremarkable Neck:  6 cm JVD, no thyromegally Lymphatics:  No adenopathy Back:  No CVA tenderness Lungs:  Clear with no wheezes HEART:  Regular rate rhythm, no murmurs, no rubs, no clicks Abd:  soft, positive bowel sounds, no organomegally, no rebound, no guarding Ext:  2 plus pulses, no edema, no cyanosis, no clubbing Skin:  No rashes no nodules Neuro:  CN II through XII intact, motor grossly intact  EKG - nsr with AV pacing  DEVICE  Normal device function.  See PaceArt for details.   Assess/Plan: 1. Symptomatic heart block - he is s/p ppm and stable from an arrhythmia perspective. He will followup in several months 2. HTN - his blood pressure is reasonably well controlled. He is encouraged to lose weight. 3. CAD - he is unable to do much of anything. He feels like he did before his CABG. I will discuss his situation with Dr. Harrington Challenger and Dr. Burt Knack. He may  need another heart cath although his advanced age and advanced disease makes him a difficult PCI candidate. 4. PPM - his [employer] Jude DDD PM is working normally. Will follow.  [name].D.

## 2016-10-17 LAB — CUP PACEART INCLINIC DEVICE CHECK
Battery Voltage: 2.99 V
Brady Statistic RA Percent Paced: 84 %
Brady Statistic RV Percent Paced: 98 %
Date Time Interrogation Session: 20180214205612
Implantable Lead Location: 753860
Lead Channel Pacing Threshold Amplitude: 0.75 V
Lead Channel Pacing Threshold Amplitude: 1.25 V
Lead Channel Pacing Threshold Amplitude: 1.25 V
Lead Channel Pacing Threshold Pulse Width: 0.4 ms
Lead Channel Pacing Threshold Pulse Width: 0.4 ms
Lead Channel Pacing Threshold Pulse Width: 0.4 ms
Lead Channel Setting Pacing Pulse Width: 0.4 ms
MDC IDC LEAD IMPLANT DT: 20170202
MDC IDC LEAD IMPLANT DT: 20170202
MDC IDC LEAD LOCATION: 753859
MDC IDC MSMT LEADCHNL RA IMPEDANCE VALUE: 437.5 Ohm
MDC IDC MSMT LEADCHNL RA SENSING INTR AMPL: 1.4 mV
MDC IDC MSMT LEADCHNL RV IMPEDANCE VALUE: 475 Ohm
MDC IDC MSMT LEADCHNL RV PACING THRESHOLD AMPLITUDE: 0.75 V
MDC IDC MSMT LEADCHNL RV PACING THRESHOLD PULSEWIDTH: 0.4 ms
MDC IDC MSMT LEADCHNL RV SENSING INTR AMPL: 10.8 mV
MDC IDC PG IMPLANT DT: 20170202
MDC IDC SET LEADCHNL RA PACING AMPLITUDE: 2.5 V
MDC IDC SET LEADCHNL RV PACING AMPLITUDE: 1.125
MDC IDC SET LEADCHNL RV SENSING SENSITIVITY: 2 mV
Pulse Gen Model: 2240
Pulse Gen Serial Number: 7864186

## 2016-10-25 ENCOUNTER — Telehealth: Payer: Self-pay | Admitting: *Deleted

## 2016-10-25 NOTE — Telephone Encounter (Signed)
Cancelled cath for 2/27 with Crystal in lab.

## 2016-10-25 NOTE — Telephone Encounter (Signed)
No answer at patient's home number.   No voice mail available.  Unable to leave message.

## 2016-10-25 NOTE — Telephone Encounter (Signed)
Call back from patient's daughter. Mrs. Cambre is currently admitted to [facility] and so patient has been spending a lot of time there with his wife.    Discussed plan/recommendations for [facility] and that I had planned to set up for 2/27.       She will discuss with her brother whether to talk to patient about this right now. I informed that due to his worsening sob, his doctors discussed that a [facility] would be the best plan for now. She appreciated the call and will call back either today or early next week.

## 2016-10-25 NOTE — Telephone Encounter (Signed)
-----   Message from [name], MD sent at 10/24/2016 10:26 PM EST ----- Spoke to [name] and Ezzie Dural  Would recomm [facility] to define anatomy given symptoms  Needs precath labs and date set.

## 2016-10-25 NOTE — Telephone Encounter (Signed)
Placed patient on Dr. Antionette Char cath schedule for 10/29/16 for now.   Have called several times to patient's home number.  Have not been able to reach anyone.  Continuing to try to reach him.

## 2016-10-25 NOTE — Telephone Encounter (Signed)
No answer at patient's home number.   Left message on daughter [name]'s phone (emergency contact) to call back.

## 2016-10-29 ENCOUNTER — Ambulatory Visit (HOSPITAL_COMMUNITY): Admission: RE | Admit: 2016-10-29 | Payer: Medicare Other | Source: Ambulatory Visit | Admitting: Cardiovascular Disease

## 2016-10-29 ENCOUNTER — Encounter (HOSPITAL_COMMUNITY): Admission: RE | Payer: Self-pay | Source: Ambulatory Visit

## 2016-10-29 SURGERY — LEFT HEART CATH AND CORONARY ANGIOGRAPHY
Anesthesia: LOCAL

## 2016-11-07 ENCOUNTER — Ambulatory Visit (INDEPENDENT_AMBULATORY_CARE_PROVIDER_SITE_OTHER): Payer: Medicare Other | Admitting: Pharmacist

## 2016-11-07 DIAGNOSIS — I480 Paroxysmal atrial fibrillation: Secondary | ICD-10-CM

## 2016-11-07 DIAGNOSIS — I4891 Unspecified atrial fibrillation: Secondary | ICD-10-CM

## 2016-11-07 DIAGNOSIS — I2510 Atherosclerotic heart disease of native coronary artery without angina pectoris: Secondary | ICD-10-CM

## 2016-11-07 DIAGNOSIS — Z9861 Coronary angioplasty status: Secondary | ICD-10-CM

## 2016-11-07 LAB — POCT INR: INR: 1.8

## 2016-11-12 NOTE — Telephone Encounter (Signed)
Dtr states that pt's wife is coming home from rehab, post hip fx, this Thursday.  Dtr states that she still hasn't told dad recommendation/s from this office due to all that was going on and pt being overwhelmed w/ wife's situation.  She asks that Edwards, RN call pt next week to discuss/inform him so that he can make his own decision as to wether to proceed w/ cath. Will forward to Michalene to address w/ pt next week.

## 2016-11-12 NOTE — Telephone Encounter (Signed)
F/U Call:  Patient's daughter is calling back to discuss the plans that were recommended for patient. Please call to discuss, thanks.   [name] can be reached at work [phone] (ask for Marc Leichter ) or on her mobile number [phone]

## 2016-11-15 DIAGNOSIS — H26492 Other secondary cataract, left eye: Secondary | ICD-10-CM | POA: Diagnosis not present

## 2016-11-15 DIAGNOSIS — Z961 Presence of intraocular lens: Secondary | ICD-10-CM | POA: Diagnosis not present

## 2016-11-15 DIAGNOSIS — H02834 Dermatochalasis of left upper eyelid: Secondary | ICD-10-CM | POA: Diagnosis not present

## 2016-11-15 DIAGNOSIS — H02831 Dermatochalasis of right upper eyelid: Secondary | ICD-10-CM | POA: Diagnosis not present

## 2016-11-21 ENCOUNTER — Telehealth: Payer: Self-pay | Admitting: Internal Medicine

## 2016-11-21 ENCOUNTER — Ambulatory Visit (INDEPENDENT_AMBULATORY_CARE_PROVIDER_SITE_OTHER): Payer: Medicare Other | Admitting: *Deleted

## 2016-11-21 DIAGNOSIS — I4891 Unspecified atrial fibrillation: Secondary | ICD-10-CM | POA: Diagnosis not present

## 2016-11-21 DIAGNOSIS — Z9861 Coronary angioplasty status: Secondary | ICD-10-CM

## 2016-11-21 DIAGNOSIS — I480 Paroxysmal atrial fibrillation: Secondary | ICD-10-CM

## 2016-11-21 DIAGNOSIS — I2510 Atherosclerotic heart disease of native coronary artery without angina pectoris: Secondary | ICD-10-CM

## 2016-11-21 LAB — POCT INR: INR: 2

## 2016-11-21 NOTE — Telephone Encounter (Signed)
Spoke to pt   His wife just got back home  Busy He still gives out with activity  Worse over past few months   Discussed L heart cath  He will consider  Will call back in 1 wks

## 2016-11-21 NOTE — Telephone Encounter (Signed)
Left message for patient to call back  

## 2016-11-21 NOTE — Telephone Encounter (Signed)
Spoke with patient and informed that Dr. Lovena Le and Dr. Harrington Challenger have discussed his worsening SOB and recommend [facility] to evaluate this.   He is willing to consider this and would like to speak with Dr. Harrington Challenger.  He will be available to speak anytime the rest of today or Friday 3/23 before 10:30 am.  He is aware that after he speaks with Dr. Harrington Challenger I will contact him to schedule.  Pt's wife continues to recover from hip fx.

## 2016-11-21 NOTE — Telephone Encounter (Signed)
New Message    Pt returning a phone call and no message was left

## 2016-11-22 DIAGNOSIS — D1801 Hemangioma of skin and subcutaneous tissue: Secondary | ICD-10-CM | POA: Diagnosis not present

## 2016-11-22 DIAGNOSIS — L821 Other seborrheic keratosis: Secondary | ICD-10-CM | POA: Diagnosis not present

## 2016-11-22 DIAGNOSIS — Z85828 Personal history of other malignant neoplasm of skin: Secondary | ICD-10-CM | POA: Diagnosis not present

## 2016-11-22 DIAGNOSIS — L570 Actinic keratosis: Secondary | ICD-10-CM | POA: Diagnosis not present

## 2016-12-05 DIAGNOSIS — I5032 Chronic diastolic (congestive) heart failure: Secondary | ICD-10-CM | POA: Diagnosis not present

## 2016-12-05 DIAGNOSIS — E785 Hyperlipidemia, unspecified: Secondary | ICD-10-CM | POA: Diagnosis not present

## 2016-12-05 DIAGNOSIS — I2510 Atherosclerotic heart disease of native coronary artery without angina pectoris: Secondary | ICD-10-CM | POA: Diagnosis not present

## 2016-12-05 DIAGNOSIS — Z79899 Other long term (current) drug therapy: Secondary | ICD-10-CM | POA: Diagnosis not present

## 2016-12-05 DIAGNOSIS — Z7982 Long term (current) use of aspirin: Secondary | ICD-10-CM | POA: Diagnosis not present

## 2016-12-05 DIAGNOSIS — E669 Obesity, unspecified: Secondary | ICD-10-CM | POA: Diagnosis not present

## 2016-12-05 DIAGNOSIS — I25119 Atherosclerotic heart disease of native coronary artery with unspecified angina pectoris: Secondary | ICD-10-CM | POA: Diagnosis not present

## 2016-12-05 DIAGNOSIS — Z87891 Personal history of nicotine dependence: Secondary | ICD-10-CM | POA: Diagnosis not present

## 2016-12-05 DIAGNOSIS — I110 Hypertensive heart disease with heart failure: Secondary | ICD-10-CM | POA: Diagnosis not present

## 2016-12-05 DIAGNOSIS — I10 Essential (primary) hypertension: Secondary | ICD-10-CM | POA: Diagnosis not present

## 2016-12-05 DIAGNOSIS — Z7901 Long term (current) use of anticoagulants: Secondary | ICD-10-CM | POA: Diagnosis not present

## 2016-12-05 DIAGNOSIS — Z951 Presence of aortocoronary bypass graft: Secondary | ICD-10-CM | POA: Diagnosis not present

## 2016-12-05 DIAGNOSIS — R0609 Other forms of dyspnea: Secondary | ICD-10-CM | POA: Diagnosis not present

## 2016-12-05 DIAGNOSIS — Z6841 Body Mass Index (BMI) 40.0 and over, adult: Secondary | ICD-10-CM | POA: Diagnosis not present

## 2016-12-06 DIAGNOSIS — H04122 Dry eye syndrome of left lacrimal gland: Secondary | ICD-10-CM | POA: Diagnosis not present

## 2016-12-06 DIAGNOSIS — H16222 Keratoconjunctivitis sicca, not specified as Sjogren's, left eye: Secondary | ICD-10-CM | POA: Diagnosis not present

## 2016-12-06 DIAGNOSIS — F329 Major depressive disorder, single episode, unspecified: Secondary | ICD-10-CM | POA: Diagnosis present

## 2016-12-06 DIAGNOSIS — H35371 Puckering of macula, right eye: Secondary | ICD-10-CM | POA: Diagnosis not present

## 2016-12-12 ENCOUNTER — Ambulatory Visit (INDEPENDENT_AMBULATORY_CARE_PROVIDER_SITE_OTHER): Payer: Medicare Other | Admitting: *Deleted

## 2016-12-12 DIAGNOSIS — I480 Paroxysmal atrial fibrillation: Secondary | ICD-10-CM

## 2016-12-12 DIAGNOSIS — I4891 Unspecified atrial fibrillation: Secondary | ICD-10-CM | POA: Diagnosis not present

## 2016-12-12 DIAGNOSIS — Z9861 Coronary angioplasty status: Secondary | ICD-10-CM

## 2016-12-12 DIAGNOSIS — I2510 Atherosclerotic heart disease of native coronary artery without angina pectoris: Secondary | ICD-10-CM

## 2016-12-12 LAB — POCT INR: INR: 2.3

## 2016-12-12 NOTE — Telephone Encounter (Signed)
Patient reports that his wife's health is very poor at SNF.  He is spending a lot of his time there.  He is not prepared at this time to consider heart catheterization.  States he has many things going on and has had to make several big decisions recently.  Advised I will inform Dr. Harrington Challenger.

## 2016-12-24 DIAGNOSIS — H9193 Unspecified hearing loss, bilateral: Secondary | ICD-10-CM | POA: Insufficient documentation

## 2016-12-30 ENCOUNTER — Encounter (HOSPITAL_BASED_OUTPATIENT_CLINIC_OR_DEPARTMENT_OTHER): Payer: Self-pay | Admitting: Emergency Medicine

## 2016-12-30 ENCOUNTER — Emergency Department (HOSPITAL_BASED_OUTPATIENT_CLINIC_OR_DEPARTMENT_OTHER): Payer: Medicare Other

## 2016-12-30 ENCOUNTER — Emergency Department (HOSPITAL_BASED_OUTPATIENT_CLINIC_OR_DEPARTMENT_OTHER)
Admission: EM | Admit: 2016-12-30 | Discharge: 2016-12-30 | Disposition: A | Discharge status: Home / Self Care | Payer: Medicare Other | Attending: Emergency Medicine | Admitting: Emergency Medicine

## 2016-12-30 DIAGNOSIS — I25810 Atherosclerosis of coronary artery bypass graft(s) without angina pectoris: Secondary | ICD-10-CM | POA: Diagnosis not present

## 2016-12-30 DIAGNOSIS — Y939 Activity, unspecified: Secondary | ICD-10-CM | POA: Diagnosis not present

## 2016-12-30 DIAGNOSIS — Y929 Unspecified place or not applicable: Secondary | ICD-10-CM | POA: Insufficient documentation

## 2016-12-30 DIAGNOSIS — I10 Essential (primary) hypertension: Secondary | ICD-10-CM | POA: Diagnosis not present

## 2016-12-30 DIAGNOSIS — S61419A Laceration without foreign body of unspecified hand, initial encounter: Secondary | ICD-10-CM

## 2016-12-30 DIAGNOSIS — Z87891 Personal history of nicotine dependence: Secondary | ICD-10-CM | POA: Diagnosis not present

## 2016-12-30 DIAGNOSIS — Z951 Presence of aortocoronary bypass graft: Secondary | ICD-10-CM | POA: Insufficient documentation

## 2016-12-30 DIAGNOSIS — S61411A Laceration without foreign body of right hand, initial encounter: Secondary | ICD-10-CM | POA: Diagnosis not present

## 2016-12-30 DIAGNOSIS — Y990 Civilian activity done for income or pay: Secondary | ICD-10-CM | POA: Diagnosis not present

## 2016-12-30 DIAGNOSIS — J45909 Unspecified asthma, uncomplicated: Secondary | ICD-10-CM | POA: Diagnosis not present

## 2016-12-30 DIAGNOSIS — Z23 Encounter for immunization: Secondary | ICD-10-CM | POA: Insufficient documentation

## 2016-12-30 DIAGNOSIS — Z8546 Personal history of malignant neoplasm of prostate: Secondary | ICD-10-CM | POA: Insufficient documentation

## 2016-12-30 DIAGNOSIS — Z79899 Other long term (current) drug therapy: Secondary | ICD-10-CM | POA: Diagnosis not present

## 2016-12-30 DIAGNOSIS — W268XXA Contact with other sharp object(s), not elsewhere classified, initial encounter: Secondary | ICD-10-CM | POA: Insufficient documentation

## 2016-12-30 DIAGNOSIS — Z85828 Personal history of other malignant neoplasm of skin: Secondary | ICD-10-CM | POA: Diagnosis not present

## 2016-12-30 MED ORDER — LIDOCAINE-EPINEPHRINE 2 %-1:100000 IJ SOLN
20.0000 mL | Freq: Once | INTRAMUSCULAR | Status: DC
  Filled 2016-12-30: qty 1

## 2016-12-30 MED ORDER — CEPHALEXIN 500 MG PO CAPS: 500.0000 mg | ORAL_CAPSULE | Freq: Four times a day (QID) | ORAL | 0 refills | Status: DC

## 2016-12-30 MED ORDER — TETANUS-DIPHTH-ACELL PERTUSSIS 5-2.5-18.5 LF-MCG/0.5 IM SUSP
0.5000 mL | Freq: Once | INTRAMUSCULAR | Status: AC
  Administered 2016-12-30: 0.5 mL via INTRAMUSCULAR
  Filled 2016-12-30: qty 0.5

## 2016-12-30 NOTE — ED Triage Notes (Signed)
Pt has laceration to right hand from tractor.  Bleeding controlled.  Good circulation.  Irregular laceration.  Good movement.

## 2016-12-30 NOTE — ED Notes (Signed)
ED Provider at bedside. 

## 2016-12-30 NOTE — ED Provider Notes (Signed)
[facility] Provider Note   CSN: [id number] Arrival date & time: 12/30/16  1854   By signing my name below, I, Levi [name], attest that this documentation has been prepared under the direction and in the presence of Levi Etienne, DO  Electronically Signed: Delton [name], ED Scribe. 12/30/16. 8:20 PM.   History   Chief Complaint Chief Complaint  Patient presents with  . Laceration    HPI Comments:  Levi [name] is a 83 y.o. male who presents to the Emergency Department complaining of acute onset, moderate laceration to his right hand s/p an incident which occurred earlier today. Pt states he cut his hand on a dirty tractor while trying to get on it. No alleviating factors noted. Pt denies any other associated symptoms. Pt is on blood thinner for heart issues. No other complaints noted at this time.   The history is provided by the patient. No language interpreter was used.  Laceration   The incident occurred 1 to 2 hours ago. The laceration is located on the right hand. The laceration mechanism was a a metal edge. The pain is moderate. The pain has been constant since onset. He reports no foreign bodies present. His tetanus status is unknown.    Past Medical History:  Diagnosis Date  . Asthma   . Benign neoplasm of colon   . CAD (coronary artery disease) Dec 2011   s/p CABG per Dr. Roxy Manns; had normal EF; All SVGs occluded per follow up cath with only LIMA to LAD patent; s/p PCI of the proximal and mid LAD February 2014 per Dr. Burt Knack  . Dyspnea on exertion   . Edema   . Emphysema    "never treated for it" (10/05/2015)  . History of hiatal hernia   . HLD (hyperlipidemia)   . HTN (hypertension)   . Malaise and fatigue   . Obesity   . Osteopenia   . Pneumonia 1960s X 1; 1990s X 1  . Presence of permanent cardiac pacemaker   . Prostate cancer ([place])   . Skin cancer    "face, nose, top of ears, scalp"  . Symptomatic bradycardia    STJ PPM, 10/05/15, Dr. Lovena Le     Patient Active Problem List   Diagnosis Date Noted  . Mobitz type 2 second degree atrioventricular block 10/05/2015  . Chronic anticoagulation 09/19/2015  . Bifascicular block 09/19/2015  . Bradycardia 09/19/2015  . Syncope 06/30/2014  . Near syncope 06/30/2014  . Lumbosacral spondylosis 02/21/2014  . Personal history of nicotine dependence 10/29/2013  . H/O coronary artery bypass surgery 10/22/2012  . Angina, class III ([place]) 10/15/2012  . CA of prostate ([place]) 07/09/2012  . Esophagitis, reflux 07/09/2012  . Allergic rhinitis 06/15/2012  . Back pain 10/24/2011  . CAD S/P PCI 2014 06/05/2011  . PAF (paroxysmal atrial fibrillation) ([place]) 09/20/2010  . OTHER SYMPTOMS INVOLVING CARDIOVASCULAR SYSTEM 09/20/2010  . PROSTATE CANCER 12/27/2009  . ADENOMATOUS COLONIC POLYP 12/27/2009  . HLD (hyperlipidemia) 12/27/2009  . Morbid obesity ([place]) 12/27/2009  . Essential hypertension 12/27/2009  . BRONCHITIS 12/27/2009  . EMPHYSEMA 12/27/2009  . OSTEOPENIA 12/27/2009  . MALAISE AND FATIGUE 12/27/2009  . EDEMA 12/27/2009  . Dyspnea on exertion 12/27/2009  . CHEST PAIN UNSPECIFIED 12/27/2009  . PAINFUL RESPIRATION 12/27/2009  . COLONIC POLYPS, ADENOMATOUS, HX OF 12/04/2009    Past Surgical History:  Procedure Laterality Date  . ANTERIOR CERVICAL DECOMP/DISCECTOMY FUSION    . BACK SURGERY    . CATARACT EXTRACTION Right   .  CORONARY ANGIOPLASTY WITH STENT PLACEMENT  10/14/12   with stent to proximal and mid LAD  . CORONARY ARTERY BYPASS GRAFT  08/23/10   "CABG X4"  . EP IMPLANTABLE DEVICE N/A 10/05/2015   Procedure: Pacemaker Implant;  Surgeon: [name], MD;  Location: [facility] LAB;  Service: Cardiovascular;  Laterality: N/A;  . EP IMPLANTABLE DEVICE N/A 10/06/2015   Procedure: Lead Revision/Repair;  Surgeon: [name], MD;  Location: [facility];  Service: Cardiovascular;  Laterality: N/A;  . INSERT / REPLACE / REMOVE PACEMAKER  10/05/2015  . LEFT HEART  CATHETERIZATION WITH CORONARY/GRAFT ANGIOGRAM N/A 09/01/2012   Procedure: LEFT HEART CATHETERIZATION WITH Levi [name];  Surgeon: Levi Dresser, MD;  Location[facility] CATH LAB;  Service: Cardiovascular;  Laterality: N/A;  . MEDIAN STERNOTOMY  08/23/10  . MOHS SURGERY  "@ least twice"  . PACEMAKER INSERTION     STJ 10/05/15  . PERCUTANEOUS CORONARY STENT INTERVENTION (PCI-S) N/A 10/14/2012   Procedure: PERCUTANEOUS CORONARY STENT INTERVENTION (PCI-S);  Surgeon: [name], MD;  Location: [facility] CATH LAB;  Service: Cardiovascular;  Laterality: N/A;  . PROSTATE BIOPSY    . TRANSURETHRAL RESECTION OF PROSTATE      Home Medications    Prior to Admission medications   Medication Sig Start Date End Date Taking? Authorizing Provider  amLODipine (NORVASC) 2.5 MG tablet TAKE 1 TABLET BY MOUTH  TWICE A DAY 08/19/16   [name], MD  aspirin 81 MG tablet Take 1 tablet (81 mg total) by mouth at bedtime. 01/06/15   [name], MD  cephALEXin (KEFLEX) 500 MG capsule Take 1 capsule (500 mg total) by mouth 4 (four) times daily. 12/30/16   Levi Etienne, DO  famotidine (PEPCID) 20 MG tablet Take 20 mg by mouth daily. 09/07/15   Historical Provider, MD  furosemide (LASIX) 20 MG tablet Take 1 tablet (20 mg total) by mouth daily. 08/27/16   [name], MD  isosorbide mononitrate (IMDUR) 60 MG 24 hr tablet TAKE ONE-HALF TABLET BY  MOUTH DAILY 07/29/16   [name], MD  losartan (COZAAR) 100 MG tablet Take 50 mg by mouth 2 (two) times daily.    Historical Provider, MD  nitroGLYCERIN (NITROSTAT) 0.4 MG SL tablet Place 0.4 mg under the tongue every 5 (five) minutes x 3 doses as needed for chest pain. 08/09/13   [name], MD  polyethylene glycol powder (GLYCOLAX/MIRALAX) powder Take 1 Container by mouth daily.  09/12/15   Historical Provider, MD  pravastatin (PRAVACHOL) 20 MG tablet TAKE 1 TABLET BY MOUTH  DAILY 07/08/16   [name], MD  spironolactone (ALDACTONE) 25 MG tablet Take 1 tablet (25 mg total)  by mouth daily. 09/27/15   [name], MD  warfarin (COUMADIN) 5 MG tablet TAKE 1 TABLET BY MOUTH  DAILY 10/14/16   [name], MD    Family History Family History  Problem Relation Age of Onset  . Kidney cancer Father   . Heart attack Father   . Hypertension Mother   . Stroke Mother   . Heart disease Brother   . Skin cancer Brother   . Prostate cancer Brother     Social History Social History  Substance Use Topics  . Smoking status: Former Smoker    Packs/day: 1.00    Years: 30.00    Types: Cigarettes    Quit date: 09/07/1975  . Smokeless tobacco: Former Systems developer    Types: Loss adjuster, chartered  Quit date: 07/27/2015  . Alcohol use No     Allergies   Patient has no known allergies.   Review of Systems Review of Systems  Constitutional: Negative for chills and fever.  HENT: Negative for congestion and facial swelling.   Eyes: Negative for discharge and visual disturbance.  Respiratory: Negative for shortness of breath.   Cardiovascular: Negative for chest pain and palpitations.  Gastrointestinal: Negative for abdominal pain, diarrhea and vomiting.  Musculoskeletal: Negative for arthralgias and myalgias.  Skin: Positive for wound. Negative for color change and rash.  Neurological: Negative for tremors, syncope and headaches.  Psychiatric/Behavioral: Negative for confusion and dysphoric mood.   Physical Exam Updated Vital Signs BP (!) 157/74 (BP Location: Right Arm)   Pulse 89   Temp 98.1 F (36.7 C) (Oral)   Resp 16   Ht '5\' 10"'$  (1.778 m)   Wt 281 lb (127.5 kg)   SpO2 96%   BMI 40.32 kg/m   Physical Exam  Constitutional: He is oriented to person, place, and time. He appears well-developed and well-nourished.  HENT:  Head: Normocephalic and atraumatic.  Eyes: EOM are normal. Pupils are equal, round, and reactive to light.  Neck: Normal range of motion. Neck supple. No JVD present.  Cardiovascular: Normal rate and regular rhythm.  Exam reveals no gallop and no friction  rub.   No murmur heard. Pulmonary/Chest: No respiratory distress. He has no wheezes.  Abdominal: He exhibits no distension. There is no rebound and no guarding.  Musculoskeletal: Normal range of motion.  Neurological: He is alert and oriented to person, place, and time.  Skin: No rash noted. No pallor.  Skin tear to the dorsal aspect of the right hand along 3rd, 4th and 5th metacarpals. Some brisk capillary bleeding from base of the wound. No foreign body noted.   Psychiatric: He has a normal mood and affect. His behavior is normal.  Nursing note and vitals reviewed.  ED Treatments / Results  DIAGNOSTIC STUDIES:  Oxygen Saturation is 100% on RA, normal by my interpretation.    COORDINATION OF CARE:  8:18 PM Discussed treatment plan with pt at bedside and pt agreed to plan.  Labs (all labs ordered are listed, but only abnormal results are displayed) Labs Reviewed - No data to display  EKG  EKG Interpretation None       Radiology Dg Hand Complete Right  Result Date: 12/30/2016 CLINICAL DATA:  Dorsal right hand laceration following an injury working with a tractor today. EXAM: RIGHT HAND - COMPLETE 3+ VIEW COMPARISON:  None. FINDINGS: Mild dorsal soft tissue swelling and irregularity with overlying bandage material. No fracture, dislocation or radiopaque foreign body. IMPRESSION: No fracture or radiopaque foreign body. Electronically Signed   By: Claudie Revering M.D.   On: 12/30/2016 19:30    Procedures Wound repair Date/Time: 12/30/2016 9:16 PM Performed by: Tyrone Nine Shreyan Hinz Authorized by: Levi [name]  Consent: Verbal consent obtained. Consent given by: patient Patient identity confirmed: verbally with patient Local anesthesia used: yes Anesthesia: local infiltration  Anesthesia: Local anesthesia used: yes Local Anesthetic: lidocaine 2% with epinephrine  Sedation: Patient sedated: no Patient tolerance: Patient tolerated the procedure well with no immediate  complications Comments: Patient had some capillary bleeding along a skin tear. This improved greatly with direct pressure in lidocaine with epinephrine.    (including critical care time)  Medications Ordered in ED Medications  lidocaine-EPINEPHrine (XYLOCAINE W/EPI) 2 %-1:100000 (with pres) injection 20 mL (not administered)  Tdap (BOOSTRIX) injection 0.5 mL (0.5  mLs Intramuscular Given 12/30/16 2032)     Initial Impression / Assessment and Plan / ED Course  I have reviewed the triage vital signs and the nursing notes.  Pertinent labs & imaging results that were available during my care of the patient were reviewed by me and considered in my medical decision making (see chart for details).     83 yo M with a chief complaint of a hand laceration. Occurred while he was trying to get on his tractor he closed the lid of the engine compartment on top of his hand. Skin tear with no foreign bodies. X-ray negative for fracture. Unable to repair this as the skin is been avulsed. Bleeding controlled at bedside.  9:18 PM:  I have discussed the diagnosis/risks/treatment options with the patient and family and believe the pt to be eligible for discharge home to follow-up with PCP. We also discussed returning to the ED immediately if new or worsening sx occur. We discussed the sx which are most concerning (e.g., sudden worsening pain, fever, inability to tolerate by mouth) that necessitate immediate return. Medications administered to the patient during their visit and any new prescriptions provided to the patient are listed below.  Medications given during this visit Medications  lidocaine-EPINEPHrine (XYLOCAINE W/EPI) 2 %-1:100000 (with pres) injection 20 mL (not administered)  Tdap (BOOSTRIX) injection 0.5 mL (0.5 mLs Intramuscular Given 12/30/16 2032)     The patient appears reasonably screen and/or stabilized for discharge and I doubt any other medical condition or other [facility] requiring further  screening, evaluation, or treatment in the ED at this time prior to discharge.    Final Clinical Impressions(s) / ED Diagnoses   Final diagnoses:  Skin tear of hand without complication, initial encounter    New Prescriptions New Prescriptions   CEPHALEXIN (KEFLEX) 500 MG CAPSULE    Take 1 capsule (500 mg total) by mouth 4 (four) times daily.  I personally performed the services described in this documentation, which was scribed in my presence. The recorded information has been reviewed and is accurate.      Levi Etienne, DO 12/30/16 2118

## 2017-01-06 DIAGNOSIS — J984 Other disorders of lung: Secondary | ICD-10-CM | POA: Diagnosis not present

## 2017-01-06 DIAGNOSIS — F329 Major depressive disorder, single episode, unspecified: Secondary | ICD-10-CM | POA: Diagnosis not present

## 2017-01-06 DIAGNOSIS — R0609 Other forms of dyspnea: Secondary | ICD-10-CM | POA: Diagnosis not present

## 2017-01-06 DIAGNOSIS — I2510 Atherosclerotic heart disease of native coronary artery without angina pectoris: Secondary | ICD-10-CM | POA: Diagnosis not present

## 2017-01-06 DIAGNOSIS — R0600 Dyspnea, unspecified: Secondary | ICD-10-CM | POA: Diagnosis not present

## 2017-01-06 DIAGNOSIS — R0689 Other abnormalities of breathing: Secondary | ICD-10-CM | POA: Diagnosis not present

## 2017-01-07 DIAGNOSIS — Z961 Presence of intraocular lens: Secondary | ICD-10-CM | POA: Diagnosis not present

## 2017-01-07 DIAGNOSIS — H16222 Keratoconjunctivitis sicca, not specified as Sjogren's, left eye: Secondary | ICD-10-CM | POA: Diagnosis not present

## 2017-01-07 DIAGNOSIS — H02135 Senile ectropion of left lower eyelid: Secondary | ICD-10-CM | POA: Diagnosis not present

## 2017-01-08 ENCOUNTER — Telehealth: Payer: Self-pay | Admitting: Internal Medicine

## 2017-01-08 DIAGNOSIS — R600 Localized edema: Secondary | ICD-10-CM | POA: Diagnosis not present

## 2017-01-08 DIAGNOSIS — I2510 Atherosclerotic heart disease of native coronary artery without angina pectoris: Secondary | ICD-10-CM | POA: Diagnosis not present

## 2017-01-08 NOTE — Telephone Encounter (Signed)
New Message:    Need to speak to a nurse,concerning medicine

## 2017-01-08 NOTE — Telephone Encounter (Signed)
Pt has appointment with APP 01/21/17.

## 2017-01-08 NOTE — Telephone Encounter (Signed)
Message sent to scheduling.  Pt should have follow up with Dr. Harrington Challenger.

## 2017-01-08 NOTE — Telephone Encounter (Signed)
I spoke with Dr. Coletta Memos (pt's primary care provider).  He is seeing pt today and pt reports he is not taking Cozaar or Aldactone. Pt has also been seen at [facility] cardiology and [facility] has it listed pt has been taking these medications.  Dr. Coletta Memos is asking if Cozaar and Aldactone were stopped by Dr. Harrington Challenger.  I reviewed chart and told him these were on pt's active medication list and I did not see that our office had stopped them.  Dr. Coletta Memos reports he will have pt resume aldactone and low dose Cozaar.  Pt would like to follow up with Dr. Harrington Challenger.  Dr. Coletta Memos will have pt contact our office to schedule an appointment.

## 2017-01-09 ENCOUNTER — Ambulatory Visit (INDEPENDENT_AMBULATORY_CARE_PROVIDER_SITE_OTHER): Payer: Medicare Other | Admitting: *Deleted

## 2017-01-09 DIAGNOSIS — I4891 Unspecified atrial fibrillation: Secondary | ICD-10-CM

## 2017-01-09 DIAGNOSIS — I480 Paroxysmal atrial fibrillation: Secondary | ICD-10-CM | POA: Diagnosis not present

## 2017-01-09 DIAGNOSIS — Z9861 Coronary angioplasty status: Secondary | ICD-10-CM

## 2017-01-09 DIAGNOSIS — I2510 Atherosclerotic heart disease of native coronary artery without angina pectoris: Secondary | ICD-10-CM

## 2017-01-09 LAB — POCT INR: INR: 1.8

## 2017-01-12 NOTE — Telephone Encounter (Signed)
See if can add on when I am in clinic

## 2017-01-14 ENCOUNTER — Encounter: Payer: Self-pay | Admitting: Internal Medicine

## 2017-01-15 ENCOUNTER — Encounter: Payer: Self-pay | Admitting: Internal Medicine

## 2017-01-15 ENCOUNTER — Ambulatory Visit (INDEPENDENT_AMBULATORY_CARE_PROVIDER_SITE_OTHER): Payer: Medicare Other | Admitting: Internal Medicine

## 2017-01-15 ENCOUNTER — Ambulatory Visit (INDEPENDENT_AMBULATORY_CARE_PROVIDER_SITE_OTHER): Payer: Medicare Other | Admitting: *Deleted

## 2017-01-15 ENCOUNTER — Telehealth: Payer: Self-pay | Admitting: Cardiology

## 2017-01-15 VITALS — BP 124/68 | HR 64 | Ht 70.0 in | Wt 279.8 lb

## 2017-01-15 DIAGNOSIS — R0602 Shortness of breath: Secondary | ICD-10-CM | POA: Diagnosis not present

## 2017-01-15 DIAGNOSIS — Z9861 Coronary angioplasty status: Secondary | ICD-10-CM | POA: Diagnosis not present

## 2017-01-15 DIAGNOSIS — E782 Mixed hyperlipidemia: Secondary | ICD-10-CM

## 2017-01-15 DIAGNOSIS — R0600 Dyspnea, unspecified: Secondary | ICD-10-CM

## 2017-01-15 DIAGNOSIS — I442 Atrioventricular block, complete: Secondary | ICD-10-CM

## 2017-01-15 DIAGNOSIS — I2510 Atherosclerotic heart disease of native coronary artery without angina pectoris: Secondary | ICD-10-CM

## 2017-01-15 DIAGNOSIS — I5032 Chronic diastolic (congestive) heart failure: Secondary | ICD-10-CM | POA: Diagnosis not present

## 2017-01-15 NOTE — Telephone Encounter (Signed)
LMOVM reminding pt to send remote transmission.   

## 2017-01-15 NOTE — Progress Notes (Signed)
Cardiology Office Note   Date:  01/15/2017   ID:  Levi [name], DOB 17-Sep-1928, MRN [id number]  PCP:  Bernerd Limbo, MD  Cardiologist:   [name], MD   F/U of CAD and diastolic CHF      History of Present Illness: Levi [name] is a 83 y.o. male with a history of CAD (s/p CABG in 2011; DES to LAD in 2014) Also ahistory of PAF (CHADSVASc 4), SB, LAFB, RBBB And SOB He was seen by [name] in Jan 2017 Underwent PPM placement in Feb [facility]) with lead revision on Feb 3.  After pacer was placed he felt  much better  When I saw him in Dec 2017 he was giving out more  Recomm adjusting lasix some  Seen by [name] in Feb for PPM   Continued to feel bad   Felt like prior to CABG Reviewed with Drs Lovena Le and Burt Knack  REcomm L heart cath to redefine anatomy Pt however did not want to pursue at the time since his wife was very ill Since I saw him his wife passed away.   He continues to complain of giving out.  SOB  Tired. He was seen at [facility] by Dr Maylon Peppers  Lasix increased   Pt set up for nuclear stresss test and PFTs (both pending) He says that since lasix increased he has not noticed much change in urine output Energy about the same   He denies CP    He comes today with daughter  She went with him to [facility] for second opinon  They are trying to decide where to go   Current Meds  Medication Sig  . amLODipine (NORVASC) 2.5 MG tablet TAKE 1 TABLET BY MOUTH  TWICE A DAY  . aspirin 81 MG tablet Take 1 tablet (81 mg total) by mouth at bedtime.  . furosemide (LASIX) 40 MG tablet Take 40 mg by mouth daily.  . isosorbide mononitrate (IMDUR) 60 MG 24 hr tablet TAKE ONE-HALF TABLET BY  MOUTH DAILY  . losartan (COZAAR) 25 MG tablet Take 25 mg by mouth 2 (two) times daily.  . nitroGLYCERIN (NITROSTAT) 0.4 MG SL tablet Place 0.4 mg under the tongue every 5 (five) minutes x 3 doses as needed for chest pain.  . polyethylene glycol powder (GLYCOLAX/MIRALAX) powder Take 1 Container by mouth  daily.   . pravastatin (PRAVACHOL) 20 MG tablet TAKE 1 TABLET BY MOUTH  DAILY  . spironolactone (ALDACTONE) 25 MG tablet Take 1 tablet (25 mg total) by mouth daily.  Marland Kitchen warfarin (COUMADIN) 5 MG tablet TAKE 1 TABLET BY MOUTH  DAILY     Allergies:   Patient has no known allergies.   Past Medical History:  Diagnosis Date  . Asthma   . Benign neoplasm of colon   . CAD (coronary artery disease) Dec 2011   s/p CABG per Dr. Roxy Manns; had normal EF; All SVGs occluded per follow up cath with only LIMA to LAD patent; s/p PCI of the proximal and mid LAD February 2014 per Dr. Burt Knack  . Dyspnea on exertion   . Edema   . Emphysema    "never treated for it" (10/05/2015)  . History of hiatal hernia   . HLD (hyperlipidemia)   . HTN (hypertension)   . Malaise and fatigue   . Obesity   . Osteopenia   . Pneumonia 1960s X 1; 1990s X 1  . Presence of permanent cardiac pacemaker   . Prostate cancer ([place])   .  Skin cancer    "face, nose, top of ears, scalp"  . Symptomatic bradycardia    STJ PPM, 10/05/15, Dr. Lovena Le    Past Surgical History:  Procedure Laterality Date  . ANTERIOR CERVICAL DECOMP/DISCECTOMY FUSION    . BACK SURGERY    . CATARACT EXTRACTION Right   . CORONARY ANGIOPLASTY WITH STENT PLACEMENT  10/14/12   with stent to proximal and mid LAD  . CORONARY ARTERY BYPASS GRAFT  08/23/10   "CABG X4"  . EP IMPLANTABLE DEVICE N/A 10/05/2015   Procedure: Pacemaker Implant;  Surgeon: [name], MD;  Location: [place] CV LAB;  Service: Cardiovascular;  Laterality: N/A;  . EP IMPLANTABLE DEVICE N/A 10/06/2015   Procedure: Lead Revision/Repair;  Surgeon: [name], MD;  Location: [place] CV LAB;  Service: Cardiovascular;  Laterality: N/A;  . INSERT / REPLACE / REMOVE PACEMAKER  10/05/2015  . LEFT HEART CATHETERIZATION WITH CORONARY/GRAFT ANGIOGRAM N/A 09/01/2012   Procedure: LEFT HEART CATHETERIZATION WITH [name];  Surgeon: [name], MD;  Location: [facility] CATH LAB;   Service: Cardiovascular;  Laterality: N/A;  . MEDIAN STERNOTOMY  08/23/10  . MOHS SURGERY  "@ least twice"  . PACEMAKER INSERTION     STJ 10/05/15  . PERCUTANEOUS CORONARY STENT INTERVENTION (PCI-S) N/A 10/14/2012   Procedure: PERCUTANEOUS CORONARY STENT INTERVENTION (PCI-S);  Surgeon: [name], MD;  Location: [facility] CATH LAB;  Service: Cardiovascular;  Laterality: N/A;  . PROSTATE BIOPSY    . TRANSURETHRAL RESECTION OF PROSTATE       Social History:  The patient  reports that he quit smoking about 41 years ago. His smoking use included Cigarettes. He has a 30.00 pack-year smoking history. He quit smokeless tobacco use about 17 months ago. His smokeless tobacco use included Chew. He reports that he does not drink alcohol or use drugs.   Family History:  The patient's family history includes Heart attack in his father; Heart disease in his brother; Hypertension in his mother; Kidney cancer in his father; Prostate cancer in his brother; Skin cancer in his brother; Stroke in his mother.    ROS:  Please see the history of present illness. All other systems are reviewed and  Negative to the above problem except as noted.    PHYSICAL EXAM: VS:  BP 124/68   Pulse 64   Ht '5\' 10"'$  (1.778 m)   Wt 279 lb 12.8 oz (126.9 kg)   BMI 40.15 kg/m   GEN: Morbidly obese 83 yo in no acute distress  HEENT: normal  Neck: Neck full   Cardiac: RRR; no murmurs, rubs, or gallops,  Tr  edema  Respiratory:  clear to auscultation bilaterally, normal work of breathing GI: soft, nontender, nondistended, + BS  No hepatomegaly  MS: no deformity Moving all extremities   Skin: warm and dry, no rash Neuro:  Strength and sensation are intact Psych: euthymic mood, full affect   EKG:  EKG is not ordered today.   Lipid Panel    Component Value Date/Time   CHOL 113 06/01/2014 0930   TRIG 115.0 06/01/2014 0930   HDL 32.40 (L) 06/01/2014 0930   CHOLHDL 3 06/01/2014 0930   VLDL 23.0 06/01/2014 0930   LDLCALC 58  06/01/2014 0930   LDLDIRECT 70.7 06/10/2013 1344      Wt Readings from Last 3 Encounters:  01/15/17 279 lb 12.8 oz (126.9 kg)  12/30/16 281 lb (127.5 kg)  10/16/16 280 lb 12.8 oz (127.4 kg)  ASSESSMENT AND PLAN  1  CAD  I spent a long time (over 20 min) reviewing with pt and informing daughter about rationale for testing  Concerned about SOB and decreased energy  ? Anginal equivalents.  He is due to have nuclear stress test next week.  I had recomm L heart cath  There is no "right" answer but if stress test normal cause still needes to be pursued. Labs at [facility] was elevated  Lasix increased  He may still have some fluid on exam  Exam difficult due to his size  Will check labs today   He is due for stress test and PFTs and  poss sleep stud I would keep on same meds   2  Diastolic CHF  Labs today  No change in meds  3  S/p PPM  Followed by G taylor  4  HL  Keep on pravastatin    Current medicines are reviewed at length with the patient today.  The patient does not have concerns regarding medicines.  Signed, [name], MD  01/15/2017 2:16 PM    [facility] [address], [place], [place]  [address] Phone: [phone]; Fax: [phone]

## 2017-01-15 NOTE — Patient Instructions (Signed)
Your physician recommends that you continue on your current medications as directed. Please refer to the Current Medication list given to you today.  Your physician recommends that you return for lab work today (BMET, BNP)

## 2017-01-16 ENCOUNTER — Encounter: Payer: Self-pay | Admitting: Cardiology

## 2017-01-16 ENCOUNTER — Other Ambulatory Visit: Payer: Self-pay | Admitting: *Deleted

## 2017-01-16 DIAGNOSIS — Z85828 Personal history of other malignant neoplasm of skin: Secondary | ICD-10-CM | POA: Diagnosis not present

## 2017-01-16 DIAGNOSIS — H61002 Unspecified perichondritis of left external ear: Secondary | ICD-10-CM | POA: Diagnosis not present

## 2017-01-16 LAB — CUP PACEART REMOTE DEVICE CHECK
Brady Statistic AP VP Percent: 80 %
Brady Statistic AP VS Percent: 1 %
Brady Statistic AS VP Percent: 18 %
Brady Statistic RA Percent Paced: 79 %
Brady Statistic RV Percent Paced: 98 %
Date Time Interrogation Session: 20180517030957
Implantable Lead Implant Date: 20170202
Implantable Lead Location: 753859
Implantable Lead Location: 753860
Lead Channel Impedance Value: 450 Ohm
Lead Channel Impedance Value: 530 Ohm
Lead Channel Pacing Threshold Amplitude: 0.875 V
Lead Channel Pacing Threshold Pulse Width: 0.4 ms
Lead Channel Pacing Threshold Pulse Width: 0.4 ms
Lead Channel Sensing Intrinsic Amplitude: 12 mV
Lead Channel Setting Pacing Amplitude: 2.5 V
Lead Channel Setting Pacing Pulse Width: 0.4 ms
Lead Channel Setting Sensing Sensitivity: 2 mV
MDC IDC LEAD IMPLANT DT: 20170202
MDC IDC MSMT BATTERY REMAINING LONGEVITY: 107 mo
MDC IDC MSMT BATTERY REMAINING PERCENTAGE: 95.5 %
MDC IDC MSMT BATTERY VOLTAGE: 2.99 V
MDC IDC MSMT LEADCHNL RA PACING THRESHOLD AMPLITUDE: 1.25 V
MDC IDC MSMT LEADCHNL RA SENSING INTR AMPL: 2.7 mV
MDC IDC PG IMPLANT DT: 20170202
MDC IDC SET LEADCHNL RV PACING AMPLITUDE: 1.125
MDC IDC STAT BRADY AS VS PERCENT: 1 %
Pulse Gen Model: 2240
Pulse Gen Serial Number: 7864186

## 2017-01-16 LAB — BASIC METABOLIC PANEL
BUN/Creatinine Ratio: 16 (ref 10–24)
BUN: 15 mg/dL (ref 8–27)
CALCIUM: 8.7 mg/dL (ref 8.6–10.2)
CHLORIDE: 102 mmol/L (ref 96–106)
CO2: 24 mmol/L (ref 18–29)
Creatinine, Ser: 0.96 mg/dL (ref 0.76–1.27)
GFR calc non Af Amer: 72 mL/min/{1.73_m2} (ref >59)
GFR, EST AFRICAN AMERICAN: 83 mL/min/{1.73_m2} (ref >59)
GLUCOSE: 88 mg/dL (ref 65–99)
Potassium: 4.1 mmol/L (ref 3.5–5.2)
Sodium: 142 mmol/L (ref 134–144)

## 2017-01-16 LAB — PRO B NATRIURETIC PEPTIDE: NT-PRO BNP: 762 pg/mL — AB (ref 0–486)

## 2017-01-16 NOTE — Progress Notes (Signed)
Remote pacemaker transmission.   

## 2017-01-16 NOTE — Telephone Encounter (Signed)
Follow up    Pt daughter called back.

## 2017-01-16 NOTE — Telephone Encounter (Signed)
CALLED PT, HE DOES NOT KNOW WHAT HE TAKES, WAS REFERRED TO HIS DPR KAREN WHOM IS HIS DAUGHTHER, WILL CONFIRM WITH HER PT'S MED LIST.    LVM FOR DPR TO CALL BACK TO CONFIRM MEDICATIONS.

## 2017-01-17 MED ORDER — FUROSEMIDE 40 MG PO TABS: 40.0000 mg | ORAL_TABLET | Freq: Every day | ORAL | 3 refills | Status: DC

## 2017-01-17 MED ORDER — LOSARTAN POTASSIUM 25 MG PO TABS: 25.0000 mg | ORAL_TABLET | Freq: Two times a day (BID) | ORAL | 3 refills | Status: DC

## 2017-01-20 DIAGNOSIS — R0609 Other forms of dyspnea: Secondary | ICD-10-CM | POA: Diagnosis not present

## 2017-01-20 DIAGNOSIS — Z951 Presence of aortocoronary bypass graft: Secondary | ICD-10-CM | POA: Diagnosis not present

## 2017-01-20 DIAGNOSIS — I2589 Other forms of chronic ischemic heart disease: Secondary | ICD-10-CM | POA: Diagnosis not present

## 2017-01-20 DIAGNOSIS — R9439 Abnormal result of other cardiovascular function study: Secondary | ICD-10-CM | POA: Diagnosis not present

## 2017-01-20 DIAGNOSIS — Z950 Presence of cardiac pacemaker: Secondary | ICD-10-CM | POA: Diagnosis not present

## 2017-01-20 DIAGNOSIS — I259 Chronic ischemic heart disease, unspecified: Secondary | ICD-10-CM | POA: Diagnosis not present

## 2017-01-21 ENCOUNTER — Ambulatory Visit: Payer: Medicare Other | Admitting: Physician Assistant

## 2017-01-21 ENCOUNTER — Telehealth: Payer: Self-pay

## 2017-01-21 ENCOUNTER — Ambulatory Visit (INDEPENDENT_AMBULATORY_CARE_PROVIDER_SITE_OTHER): Payer: Medicare Other

## 2017-01-21 ENCOUNTER — Other Ambulatory Visit: Payer: Self-pay | Admitting: *Deleted

## 2017-01-21 ENCOUNTER — Telehealth: Payer: Self-pay | Admitting: Internal Medicine

## 2017-01-21 DIAGNOSIS — Z9861 Coronary angioplasty status: Secondary | ICD-10-CM

## 2017-01-21 DIAGNOSIS — I4891 Unspecified atrial fibrillation: Secondary | ICD-10-CM | POA: Diagnosis not present

## 2017-01-21 DIAGNOSIS — I480 Paroxysmal atrial fibrillation: Secondary | ICD-10-CM | POA: Diagnosis not present

## 2017-01-21 DIAGNOSIS — I2510 Atherosclerotic heart disease of native coronary artery without angina pectoris: Secondary | ICD-10-CM | POA: Diagnosis not present

## 2017-01-21 LAB — POCT INR: INR: 1.8

## 2017-01-21 MED ORDER — SPIRONOLACTONE 25 MG PO TABS: 25.0000 mg | ORAL_TABLET | Freq: Every day | ORAL | 1 refills | Status: DC

## 2017-01-21 NOTE — Telephone Encounter (Signed)
Spoke to daughter   They are reflecting on where to have heart cath Levi [name] had Dr Burt Knack do procedure before He is in cath lab this Thursday and next Wed. Daughter said they would call back Pt will need precath labs drawn

## 2017-01-21 NOTE — Telephone Encounter (Signed)
Left message for patient to call back  

## 2017-01-21 NOTE — Telephone Encounter (Signed)
New Message      Ask Dr Harrington Challenger if her dad decides to have  the heart cath done , who would do the procedure ? And where would it be done?  What is the next day that it would be available to be done

## 2017-01-21 NOTE — Telephone Encounter (Signed)
Pt went to [facility] for second opinion on heart blockage, they want pt to have a heart cath with possible stent intervention based on findings.  Pt would like for Dr Harrington Challenger to call [name], pt's daughter to discuss.

## 2017-01-22 NOTE — Telephone Encounter (Signed)
Patient's daughter already talked to Dr. Harrington Challenger last night. Patient will call back when he decides if he is going to have a heart cath.

## 2017-01-29 DIAGNOSIS — S39012A Strain of muscle, fascia and tendon of lower back, initial encounter: Secondary | ICD-10-CM | POA: Diagnosis not present

## 2017-01-31 ENCOUNTER — Encounter: Payer: Self-pay | Admitting: Cardiology

## 2017-02-04 ENCOUNTER — Ambulatory Visit (INDEPENDENT_AMBULATORY_CARE_PROVIDER_SITE_OTHER): Payer: Medicare Other | Admitting: *Deleted

## 2017-02-04 DIAGNOSIS — I480 Paroxysmal atrial fibrillation: Secondary | ICD-10-CM | POA: Diagnosis not present

## 2017-02-04 DIAGNOSIS — I2510 Atherosclerotic heart disease of native coronary artery without angina pectoris: Secondary | ICD-10-CM

## 2017-02-04 DIAGNOSIS — I4891 Unspecified atrial fibrillation: Secondary | ICD-10-CM

## 2017-02-04 DIAGNOSIS — Z9861 Coronary angioplasty status: Secondary | ICD-10-CM

## 2017-02-04 LAB — POCT INR: INR: 4.9

## 2017-02-10 DIAGNOSIS — I25810 Atherosclerosis of coronary artery bypass graft(s) without angina pectoris: Secondary | ICD-10-CM | POA: Diagnosis not present

## 2017-02-10 DIAGNOSIS — Z87891 Personal history of nicotine dependence: Secondary | ICD-10-CM | POA: Diagnosis not present

## 2017-02-10 DIAGNOSIS — T82855A Stenosis of coronary artery stent, initial encounter: Secondary | ICD-10-CM | POA: Diagnosis not present

## 2017-02-10 DIAGNOSIS — Z951 Presence of aortocoronary bypass graft: Secondary | ICD-10-CM | POA: Diagnosis not present

## 2017-02-10 DIAGNOSIS — I2510 Atherosclerotic heart disease of native coronary artery without angina pectoris: Secondary | ICD-10-CM | POA: Diagnosis not present

## 2017-02-10 DIAGNOSIS — R9439 Abnormal result of other cardiovascular function study: Secondary | ICD-10-CM | POA: Diagnosis not present

## 2017-02-10 DIAGNOSIS — I2582 Chronic total occlusion of coronary artery: Secondary | ICD-10-CM | POA: Diagnosis not present

## 2017-02-16 DIAGNOSIS — G4739 Other sleep apnea: Secondary | ICD-10-CM | POA: Diagnosis not present

## 2017-02-16 DIAGNOSIS — I499 Cardiac arrhythmia, unspecified: Secondary | ICD-10-CM | POA: Diagnosis not present

## 2017-02-16 DIAGNOSIS — G4731 Primary central sleep apnea: Secondary | ICD-10-CM | POA: Diagnosis not present

## 2017-02-18 ENCOUNTER — Ambulatory Visit (INDEPENDENT_AMBULATORY_CARE_PROVIDER_SITE_OTHER): Payer: Medicare Other | Admitting: *Deleted

## 2017-02-18 DIAGNOSIS — I4891 Unspecified atrial fibrillation: Secondary | ICD-10-CM | POA: Diagnosis not present

## 2017-02-18 DIAGNOSIS — I480 Paroxysmal atrial fibrillation: Secondary | ICD-10-CM | POA: Diagnosis not present

## 2017-02-18 DIAGNOSIS — I2510 Atherosclerotic heart disease of native coronary artery without angina pectoris: Secondary | ICD-10-CM

## 2017-02-18 DIAGNOSIS — Z9861 Coronary angioplasty status: Secondary | ICD-10-CM

## 2017-02-18 LAB — POCT INR: INR: 1.6

## 2017-02-19 DIAGNOSIS — R0683 Snoring: Secondary | ICD-10-CM | POA: Diagnosis not present

## 2017-02-19 DIAGNOSIS — R5383 Other fatigue: Secondary | ICD-10-CM | POA: Diagnosis not present

## 2017-02-19 DIAGNOSIS — E669 Obesity, unspecified: Secondary | ICD-10-CM | POA: Insufficient documentation

## 2017-02-19 DIAGNOSIS — K219 Gastro-esophageal reflux disease without esophagitis: Secondary | ICD-10-CM | POA: Diagnosis not present

## 2017-02-19 DIAGNOSIS — Z87891 Personal history of nicotine dependence: Secondary | ICD-10-CM | POA: Diagnosis not present

## 2017-02-19 DIAGNOSIS — G4733 Obstructive sleep apnea (adult) (pediatric): Secondary | ICD-10-CM | POA: Insufficient documentation

## 2017-02-19 DIAGNOSIS — I2510 Atherosclerotic heart disease of native coronary artery without angina pectoris: Secondary | ICD-10-CM | POA: Diagnosis not present

## 2017-02-19 DIAGNOSIS — J309 Allergic rhinitis, unspecified: Secondary | ICD-10-CM | POA: Diagnosis not present

## 2017-02-19 DIAGNOSIS — Z955 Presence of coronary angioplasty implant and graft: Secondary | ICD-10-CM | POA: Diagnosis not present

## 2017-02-19 DIAGNOSIS — Z8679 Personal history of other diseases of the circulatory system: Secondary | ICD-10-CM | POA: Diagnosis not present

## 2017-02-19 DIAGNOSIS — F329 Major depressive disorder, single episode, unspecified: Secondary | ICD-10-CM | POA: Diagnosis not present

## 2017-02-26 ENCOUNTER — Other Ambulatory Visit: Payer: Self-pay | Admitting: Internal Medicine

## 2017-02-28 ENCOUNTER — Ambulatory Visit (INDEPENDENT_AMBULATORY_CARE_PROVIDER_SITE_OTHER): Payer: Medicare Other

## 2017-02-28 DIAGNOSIS — I2510 Atherosclerotic heart disease of native coronary artery without angina pectoris: Secondary | ICD-10-CM | POA: Diagnosis not present

## 2017-02-28 DIAGNOSIS — I480 Paroxysmal atrial fibrillation: Secondary | ICD-10-CM | POA: Diagnosis not present

## 2017-02-28 DIAGNOSIS — Z9861 Coronary angioplasty status: Secondary | ICD-10-CM

## 2017-02-28 DIAGNOSIS — I4891 Unspecified atrial fibrillation: Secondary | ICD-10-CM | POA: Diagnosis not present

## 2017-02-28 LAB — POCT INR: INR: 2.2

## 2017-03-02 DIAGNOSIS — G4733 Obstructive sleep apnea (adult) (pediatric): Secondary | ICD-10-CM | POA: Diagnosis not present

## 2017-03-02 DIAGNOSIS — G4730 Sleep apnea, unspecified: Secondary | ICD-10-CM | POA: Diagnosis not present

## 2017-03-02 DIAGNOSIS — R0683 Snoring: Secondary | ICD-10-CM | POA: Diagnosis not present

## 2017-03-18 ENCOUNTER — Ambulatory Visit (INDEPENDENT_AMBULATORY_CARE_PROVIDER_SITE_OTHER): Payer: Medicare Other

## 2017-03-18 DIAGNOSIS — I4891 Unspecified atrial fibrillation: Secondary | ICD-10-CM | POA: Diagnosis not present

## 2017-03-18 DIAGNOSIS — Z9861 Coronary angioplasty status: Secondary | ICD-10-CM

## 2017-03-18 DIAGNOSIS — I2510 Atherosclerotic heart disease of native coronary artery without angina pectoris: Secondary | ICD-10-CM

## 2017-03-18 DIAGNOSIS — I480 Paroxysmal atrial fibrillation: Secondary | ICD-10-CM | POA: Diagnosis not present

## 2017-03-18 LAB — POCT INR: INR: 2.1

## 2017-03-20 ENCOUNTER — Other Ambulatory Visit: Payer: Self-pay | Admitting: Internal Medicine

## 2017-04-11 ENCOUNTER — Other Ambulatory Visit: Payer: Self-pay | Admitting: Internal Medicine

## 2017-04-14 DIAGNOSIS — G4733 Obstructive sleep apnea (adult) (pediatric): Secondary | ICD-10-CM | POA: Diagnosis not present

## 2017-04-15 ENCOUNTER — Ambulatory Visit (INDEPENDENT_AMBULATORY_CARE_PROVIDER_SITE_OTHER): Payer: Medicare Other

## 2017-04-15 DIAGNOSIS — I480 Paroxysmal atrial fibrillation: Secondary | ICD-10-CM | POA: Diagnosis not present

## 2017-04-15 DIAGNOSIS — I2510 Atherosclerotic heart disease of native coronary artery without angina pectoris: Secondary | ICD-10-CM

## 2017-04-15 DIAGNOSIS — I4891 Unspecified atrial fibrillation: Secondary | ICD-10-CM | POA: Diagnosis not present

## 2017-04-15 DIAGNOSIS — Z9861 Coronary angioplasty status: Secondary | ICD-10-CM | POA: Diagnosis not present

## 2017-04-15 LAB — POCT INR: INR: 1.7

## 2017-04-16 ENCOUNTER — Encounter: Payer: Medicare Other | Admitting: *Deleted

## 2017-04-16 ENCOUNTER — Encounter: Payer: Self-pay | Admitting: Internal Medicine

## 2017-04-23 ENCOUNTER — Encounter: Payer: Self-pay | Admitting: Cardiology

## 2017-04-24 DIAGNOSIS — I110 Hypertensive heart disease with heart failure: Secondary | ICD-10-CM | POA: Diagnosis not present

## 2017-04-24 DIAGNOSIS — I4891 Unspecified atrial fibrillation: Secondary | ICD-10-CM | POA: Diagnosis not present

## 2017-04-24 DIAGNOSIS — Z950 Presence of cardiac pacemaker: Secondary | ICD-10-CM | POA: Diagnosis not present

## 2017-04-24 DIAGNOSIS — Z87891 Personal history of nicotine dependence: Secondary | ICD-10-CM | POA: Diagnosis not present

## 2017-04-24 DIAGNOSIS — Z79899 Other long term (current) drug therapy: Secondary | ICD-10-CM | POA: Diagnosis not present

## 2017-04-24 DIAGNOSIS — Z7982 Long term (current) use of aspirin: Secondary | ICD-10-CM | POA: Diagnosis not present

## 2017-04-24 DIAGNOSIS — I2510 Atherosclerotic heart disease of native coronary artery without angina pectoris: Secondary | ICD-10-CM | POA: Diagnosis not present

## 2017-04-24 DIAGNOSIS — R0609 Other forms of dyspnea: Secondary | ICD-10-CM | POA: Diagnosis not present

## 2017-04-24 DIAGNOSIS — Z7901 Long term (current) use of anticoagulants: Secondary | ICD-10-CM | POA: Diagnosis not present

## 2017-04-24 DIAGNOSIS — I5032 Chronic diastolic (congestive) heart failure: Secondary | ICD-10-CM | POA: Diagnosis not present

## 2017-04-25 DIAGNOSIS — G4733 Obstructive sleep apnea (adult) (pediatric): Secondary | ICD-10-CM | POA: Diagnosis not present

## 2017-04-25 DIAGNOSIS — Z961 Presence of intraocular lens: Secondary | ICD-10-CM | POA: Diagnosis not present

## 2017-04-25 DIAGNOSIS — H02105 Unspecified ectropion of left lower eyelid: Secondary | ICD-10-CM | POA: Diagnosis not present

## 2017-04-25 DIAGNOSIS — H16222 Keratoconjunctivitis sicca, not specified as Sjogren's, left eye: Secondary | ICD-10-CM | POA: Diagnosis not present

## 2017-04-25 DIAGNOSIS — I2510 Atherosclerotic heart disease of native coronary artery without angina pectoris: Secondary | ICD-10-CM | POA: Diagnosis not present

## 2017-04-25 DIAGNOSIS — H04522 Eversion of left lacrimal punctum: Secondary | ICD-10-CM | POA: Diagnosis not present

## 2017-04-25 DIAGNOSIS — K5900 Constipation, unspecified: Secondary | ICD-10-CM | POA: Diagnosis not present

## 2017-04-28 ENCOUNTER — Ambulatory Visit (INDEPENDENT_AMBULATORY_CARE_PROVIDER_SITE_OTHER): Payer: Medicare Other | Admitting: *Deleted

## 2017-04-28 ENCOUNTER — Telehealth: Payer: Self-pay | Admitting: Internal Medicine

## 2017-04-28 DIAGNOSIS — I442 Atrioventricular block, complete: Secondary | ICD-10-CM | POA: Diagnosis not present

## 2017-04-28 NOTE — Telephone Encounter (Signed)
Transmission unsuccessful~ pt given number to [employer] to call in the morning for trouble shooting home monitor.

## 2017-04-28 NOTE — Telephone Encounter (Signed)
New message    Pt is calling about his transmission he missed. He said he needs help because he's never had trouble with it before. Please call.

## 2017-04-28 NOTE — Telephone Encounter (Signed)
LVM on pt home phone to call back. Also called the mobile number listed which is his daughter she stated that he was not at home and it may be tomorrow before he can call back. Direct number to device clinic given.

## 2017-04-29 NOTE — Telephone Encounter (Signed)
Transmission received.

## 2017-05-02 NOTE — Progress Notes (Signed)
Remote pacemaker transmission.   

## 2017-05-08 ENCOUNTER — Ambulatory Visit (INDEPENDENT_AMBULATORY_CARE_PROVIDER_SITE_OTHER): Payer: Medicare Other | Admitting: *Deleted

## 2017-05-08 DIAGNOSIS — I4891 Unspecified atrial fibrillation: Secondary | ICD-10-CM | POA: Diagnosis not present

## 2017-05-08 DIAGNOSIS — I480 Paroxysmal atrial fibrillation: Secondary | ICD-10-CM

## 2017-05-08 DIAGNOSIS — Z5181 Encounter for therapeutic drug level monitoring: Secondary | ICD-10-CM

## 2017-05-08 LAB — POCT INR: INR: 2.5

## 2017-05-09 ENCOUNTER — Ambulatory Visit: Payer: Medicare Other | Admitting: Internal Medicine

## 2017-05-09 ENCOUNTER — Encounter: Payer: Self-pay | Admitting: Cardiology

## 2017-05-16 LAB — CUP PACEART REMOTE DEVICE CHECK
Implantable Lead Implant Date: 20170202
Implantable Lead Location: 753860
Implantable Pulse Generator Implant Date: 20170202
MDC IDC LEAD IMPLANT DT: 20170202
MDC IDC LEAD LOCATION: 753859
MDC IDC SESS DTM: 20180914100508
Pulse Gen Serial Number: 7864186

## 2017-06-05 ENCOUNTER — Ambulatory Visit (INDEPENDENT_AMBULATORY_CARE_PROVIDER_SITE_OTHER): Payer: Medicare Other | Admitting: *Deleted

## 2017-06-05 DIAGNOSIS — I4891 Unspecified atrial fibrillation: Secondary | ICD-10-CM | POA: Diagnosis not present

## 2017-06-05 DIAGNOSIS — Z5181 Encounter for therapeutic drug level monitoring: Secondary | ICD-10-CM

## 2017-06-05 DIAGNOSIS — Z951 Presence of aortocoronary bypass graft: Secondary | ICD-10-CM | POA: Diagnosis not present

## 2017-06-05 DIAGNOSIS — I480 Paroxysmal atrial fibrillation: Secondary | ICD-10-CM

## 2017-06-05 LAB — POCT INR: INR: 4.3

## 2017-06-09 DIAGNOSIS — H02135 Senile ectropion of left lower eyelid: Secondary | ICD-10-CM | POA: Diagnosis not present

## 2017-06-09 DIAGNOSIS — H02105 Unspecified ectropion of left lower eyelid: Secondary | ICD-10-CM | POA: Diagnosis not present

## 2017-06-09 DIAGNOSIS — I5042 Chronic combined systolic (congestive) and diastolic (congestive) heart failure: Secondary | ICD-10-CM | POA: Insufficient documentation

## 2017-06-09 DIAGNOSIS — H04523 Eversion of bilateral lacrimal punctum: Secondary | ICD-10-CM | POA: Diagnosis not present

## 2017-06-09 DIAGNOSIS — H11433 Conjunctival hyperemia, bilateral: Secondary | ICD-10-CM | POA: Diagnosis not present

## 2017-06-09 DIAGNOSIS — Z961 Presence of intraocular lens: Secondary | ICD-10-CM | POA: Diagnosis not present

## 2017-06-09 DIAGNOSIS — H02132 Senile ectropion of right lower eyelid: Secondary | ICD-10-CM | POA: Diagnosis not present

## 2017-06-09 DIAGNOSIS — I5033 Acute on chronic diastolic (congestive) heart failure: Secondary | ICD-10-CM | POA: Insufficient documentation

## 2017-06-10 DIAGNOSIS — D1801 Hemangioma of skin and subcutaneous tissue: Secondary | ICD-10-CM | POA: Diagnosis not present

## 2017-06-10 DIAGNOSIS — L570 Actinic keratosis: Secondary | ICD-10-CM | POA: Diagnosis not present

## 2017-06-10 DIAGNOSIS — L821 Other seborrheic keratosis: Secondary | ICD-10-CM | POA: Diagnosis not present

## 2017-06-10 DIAGNOSIS — Z85828 Personal history of other malignant neoplasm of skin: Secondary | ICD-10-CM | POA: Diagnosis not present

## 2017-06-12 DIAGNOSIS — Z8546 Personal history of malignant neoplasm of prostate: Secondary | ICD-10-CM | POA: Diagnosis not present

## 2017-06-12 DIAGNOSIS — R399 Unspecified symptoms and signs involving the genitourinary system: Secondary | ICD-10-CM | POA: Diagnosis not present

## 2017-06-12 DIAGNOSIS — Z23 Encounter for immunization: Secondary | ICD-10-CM | POA: Diagnosis not present

## 2017-06-12 DIAGNOSIS — R3911 Hesitancy of micturition: Secondary | ICD-10-CM | POA: Diagnosis not present

## 2017-06-19 ENCOUNTER — Ambulatory Visit (INDEPENDENT_AMBULATORY_CARE_PROVIDER_SITE_OTHER): Payer: Medicare Other | Admitting: *Deleted

## 2017-06-19 ENCOUNTER — Other Ambulatory Visit: Payer: Self-pay | Admitting: Internal Medicine

## 2017-06-19 DIAGNOSIS — I480 Paroxysmal atrial fibrillation: Secondary | ICD-10-CM | POA: Diagnosis not present

## 2017-06-19 DIAGNOSIS — Z5181 Encounter for therapeutic drug level monitoring: Secondary | ICD-10-CM | POA: Diagnosis not present

## 2017-06-19 DIAGNOSIS — I4891 Unspecified atrial fibrillation: Secondary | ICD-10-CM | POA: Diagnosis not present

## 2017-06-19 LAB — POCT INR: INR: 1.7

## 2017-07-01 DIAGNOSIS — C61 Malignant neoplasm of prostate: Secondary | ICD-10-CM | POA: Diagnosis not present

## 2017-07-03 ENCOUNTER — Ambulatory Visit (INDEPENDENT_AMBULATORY_CARE_PROVIDER_SITE_OTHER): Payer: Medicare Other | Admitting: Pharmacist

## 2017-07-03 DIAGNOSIS — I480 Paroxysmal atrial fibrillation: Secondary | ICD-10-CM | POA: Diagnosis not present

## 2017-07-03 DIAGNOSIS — I4891 Unspecified atrial fibrillation: Secondary | ICD-10-CM

## 2017-07-03 LAB — POCT INR: INR: 2.2

## 2017-07-04 ENCOUNTER — Telehealth: Payer: Self-pay | Admitting: Pharmacist

## 2017-07-04 ENCOUNTER — Telehealth: Payer: Self-pay | Admitting: Cardiology

## 2017-07-04 ENCOUNTER — Telehealth: Payer: Self-pay | Admitting: Internal Medicine

## 2017-07-04 NOTE — Telephone Encounter (Signed)
Clearance has been faxed to [facility] [phone]. Pt is aware of Coumadin instructions.

## 2017-07-04 NOTE — Telephone Encounter (Signed)
New Message ° ° pt daughter verbalized that she is returning call for rn  °

## 2017-07-04 NOTE — Telephone Encounter (Signed)
Relayed Coumadin message to pt's daughter - ok to hold 4 days prior to upcoming eyelid procedure. She will remind pt of this as well.

## 2017-07-04 NOTE — Telephone Encounter (Signed)
Received clearance request from [facility] that pt is scheduled for bilateral lower eyelid ectropion and medial spindle and punctoplasty. Pt takes Coumadin for afib with CHADS2VASc score of 5 (HTN, age x2, CAD, and CHF). Ok to hold Coumadin for 4 days prior to procedure.  Pt will be undergoing MAC and will require cardiac clearance as well.

## 2017-07-04 NOTE — Telephone Encounter (Signed)
I spoke with the pt's daughter about her father's upcoming surgery.  Kerin Ransom PA-C 07/04/2017 2:23 PM

## 2017-07-04 NOTE — Telephone Encounter (Signed)
I spoke with the pt's daughter who handles his medical affairs. Since he saw Dr Harrington Challenger in May 2018 the pt was evaluated by cardiology at [facility] as a second opinion, including a coronary angiogram done in June (see cath note in care everywhere)- plan is for medical Rx. He saw his cardiologist at [facility] in Sept. He has been doing well, no angina. He will resume follow up with Dr Harrington Challenger after the first of the year.  The pt has been evaluated by a cardiologist in the last 2 months and the pt has not had any recent chest pain. He is at low risk -0.9% of an adverse cardiac event with upcoming surgery and he is cleared from a cardiac standpoint without further workup.  Kerin Ransom PA-C 07/04/2017 2:40 PM

## 2017-07-10 ENCOUNTER — Telehealth: Payer: Self-pay | Admitting: *Deleted

## 2017-07-10 NOTE — Telephone Encounter (Signed)
Spoke with pt as he states is scheduled for procedure on both eyes on Nov 19th and has been instructed to hold coumadin for 4 days prior to surgery Instructed last day to take coumadin is Nov 14th and no coumadin until after surgery and to ask Md doing surgery when to restart coumadin restart at same dose and made appt to be seen in coumadin clinic on Nov 26th in the afternoon and he states understanding See clearance note 07/04/2017

## 2017-07-11 NOTE — Telephone Encounter (Signed)
Spoke with pt's daughter  to review with her the instructions given to pt yesterday regarding his surgery on the 19th  and she states understanding

## 2017-07-21 DIAGNOSIS — H02535 Eyelid retraction left lower eyelid: Secondary | ICD-10-CM | POA: Diagnosis not present

## 2017-07-21 DIAGNOSIS — H02135 Senile ectropion of left lower eyelid: Secondary | ICD-10-CM | POA: Diagnosis not present

## 2017-07-21 DIAGNOSIS — H02132 Senile ectropion of right lower eyelid: Secondary | ICD-10-CM | POA: Diagnosis not present

## 2017-07-21 DIAGNOSIS — H02032 Senile entropion of right lower eyelid: Secondary | ICD-10-CM | POA: Diagnosis not present

## 2017-07-21 DIAGNOSIS — H04563 Stenosis of bilateral lacrimal punctum: Secondary | ICD-10-CM | POA: Diagnosis not present

## 2017-07-21 DIAGNOSIS — H04523 Eversion of bilateral lacrimal punctum: Secondary | ICD-10-CM | POA: Diagnosis not present

## 2017-07-21 DIAGNOSIS — H02832 Dermatochalasis of right lower eyelid: Secondary | ICD-10-CM | POA: Diagnosis not present

## 2017-07-21 DIAGNOSIS — H02532 Eyelid retraction right lower eyelid: Secondary | ICD-10-CM | POA: Diagnosis not present

## 2017-07-21 DIAGNOSIS — H02035 Senile entropion of left lower eyelid: Secondary | ICD-10-CM | POA: Diagnosis not present

## 2017-07-21 DIAGNOSIS — H02835 Dermatochalasis of left lower eyelid: Secondary | ICD-10-CM | POA: Diagnosis not present

## 2017-07-21 DIAGNOSIS — H11433 Conjunctival hyperemia, bilateral: Secondary | ICD-10-CM | POA: Diagnosis not present

## 2017-07-21 DIAGNOSIS — H11823 Conjunctivochalasis, bilateral: Secondary | ICD-10-CM | POA: Diagnosis not present

## 2017-07-28 ENCOUNTER — Ambulatory Visit (INDEPENDENT_AMBULATORY_CARE_PROVIDER_SITE_OTHER): Payer: Medicare Other | Admitting: *Deleted

## 2017-07-28 DIAGNOSIS — I480 Paroxysmal atrial fibrillation: Secondary | ICD-10-CM

## 2017-07-28 DIAGNOSIS — I4891 Unspecified atrial fibrillation: Secondary | ICD-10-CM

## 2017-07-28 DIAGNOSIS — Z5181 Encounter for therapeutic drug level monitoring: Secondary | ICD-10-CM

## 2017-07-28 LAB — POCT INR: INR: 1.3

## 2017-07-28 NOTE — Patient Instructions (Signed)
Today and tomorrow take 1.5 tablets, then Continue  same dosage 1 tablet everyday. Recheck INR in 10 days. Coumadin Clinic [phone]

## 2017-08-04 ENCOUNTER — Telehealth: Payer: Self-pay | Admitting: Cardiology

## 2017-08-04 ENCOUNTER — Ambulatory Visit (INDEPENDENT_AMBULATORY_CARE_PROVIDER_SITE_OTHER): Payer: Self-pay | Admitting: *Deleted

## 2017-08-04 DIAGNOSIS — I480 Paroxysmal atrial fibrillation: Secondary | ICD-10-CM

## 2017-08-04 NOTE — Telephone Encounter (Signed)
LMOVM reminding pt to send remote transmission.   

## 2017-08-06 ENCOUNTER — Emergency Department (HOSPITAL_COMMUNITY): Payer: Medicare Other

## 2017-08-06 ENCOUNTER — Observation Stay (HOSPITAL_COMMUNITY)
Admission: EM | Admit: 2017-08-06 | Discharge: 2017-08-07 | Disposition: A | Discharge status: Home / Self Care | Payer: Medicare Other | Attending: Internal Medicine | Admitting: Internal Medicine

## 2017-08-06 ENCOUNTER — Encounter (HOSPITAL_COMMUNITY): Payer: Self-pay | Admitting: Emergency Medicine

## 2017-08-06 ENCOUNTER — Other Ambulatory Visit: Payer: Self-pay

## 2017-08-06 DIAGNOSIS — K2100 Gastro-esophageal reflux disease with esophagitis, without bleeding: Secondary | ICD-10-CM | POA: Diagnosis present

## 2017-08-06 DIAGNOSIS — W19XXXA Unspecified fall, initial encounter: Secondary | ICD-10-CM | POA: Insufficient documentation

## 2017-08-06 DIAGNOSIS — M7989 Other specified soft tissue disorders: Secondary | ICD-10-CM | POA: Insufficient documentation

## 2017-08-06 DIAGNOSIS — F329 Major depressive disorder, single episode, unspecified: Secondary | ICD-10-CM | POA: Diagnosis not present

## 2017-08-06 DIAGNOSIS — R0602 Shortness of breath: Secondary | ICD-10-CM | POA: Diagnosis not present

## 2017-08-06 DIAGNOSIS — D72829 Elevated white blood cell count, unspecified: Secondary | ICD-10-CM | POA: Diagnosis not present

## 2017-08-06 DIAGNOSIS — I2510 Atherosclerotic heart disease of native coronary artery without angina pectoris: Secondary | ICD-10-CM | POA: Insufficient documentation

## 2017-08-06 DIAGNOSIS — I5030 Unspecified diastolic (congestive) heart failure: Secondary | ICD-10-CM | POA: Diagnosis not present

## 2017-08-06 DIAGNOSIS — I10 Essential (primary) hypertension: Secondary | ICD-10-CM | POA: Diagnosis present

## 2017-08-06 DIAGNOSIS — I4892 Unspecified atrial flutter: Secondary | ICD-10-CM | POA: Insufficient documentation

## 2017-08-06 DIAGNOSIS — Z85828 Personal history of other malignant neoplasm of skin: Secondary | ICD-10-CM | POA: Insufficient documentation

## 2017-08-06 DIAGNOSIS — Z981 Arthrodesis status: Secondary | ICD-10-CM | POA: Insufficient documentation

## 2017-08-06 DIAGNOSIS — R404 Transient alteration of awareness: Secondary | ICD-10-CM | POA: Diagnosis not present

## 2017-08-06 DIAGNOSIS — I110 Hypertensive heart disease with heart failure: Secondary | ICD-10-CM | POA: Diagnosis not present

## 2017-08-06 DIAGNOSIS — Z79899 Other long term (current) drug therapy: Secondary | ICD-10-CM | POA: Insufficient documentation

## 2017-08-06 DIAGNOSIS — Z6837 Body mass index (BMI) 37.0-37.9, adult: Secondary | ICD-10-CM | POA: Insufficient documentation

## 2017-08-06 DIAGNOSIS — J439 Emphysema, unspecified: Secondary | ICD-10-CM | POA: Insufficient documentation

## 2017-08-06 DIAGNOSIS — Z9861 Coronary angioplasty status: Secondary | ICD-10-CM

## 2017-08-06 DIAGNOSIS — I480 Paroxysmal atrial fibrillation: Secondary | ICD-10-CM | POA: Diagnosis not present

## 2017-08-06 DIAGNOSIS — Z7982 Long term (current) use of aspirin: Secondary | ICD-10-CM | POA: Insufficient documentation

## 2017-08-06 DIAGNOSIS — Z951 Presence of aortocoronary bypass graft: Secondary | ICD-10-CM | POA: Insufficient documentation

## 2017-08-06 DIAGNOSIS — I5032 Chronic diastolic (congestive) heart failure: Secondary | ICD-10-CM | POA: Insufficient documentation

## 2017-08-06 DIAGNOSIS — Z8546 Personal history of malignant neoplasm of prostate: Secondary | ICD-10-CM | POA: Insufficient documentation

## 2017-08-06 DIAGNOSIS — G3189 Other specified degenerative diseases of nervous system: Secondary | ICD-10-CM | POA: Insufficient documentation

## 2017-08-06 DIAGNOSIS — E785 Hyperlipidemia, unspecified: Secondary | ICD-10-CM | POA: Diagnosis present

## 2017-08-06 DIAGNOSIS — M25512 Pain in left shoulder: Secondary | ICD-10-CM | POA: Insufficient documentation

## 2017-08-06 DIAGNOSIS — I452 Bifascicular block: Secondary | ICD-10-CM | POA: Diagnosis not present

## 2017-08-06 DIAGNOSIS — G44319 Acute post-traumatic headache, not intractable: Secondary | ICD-10-CM

## 2017-08-06 DIAGNOSIS — R55 Syncope and collapse: Secondary | ICD-10-CM | POA: Diagnosis not present

## 2017-08-06 DIAGNOSIS — Z7901 Long term (current) use of anticoagulants: Secondary | ICD-10-CM | POA: Diagnosis not present

## 2017-08-06 DIAGNOSIS — Z87891 Personal history of nicotine dependence: Secondary | ICD-10-CM | POA: Insufficient documentation

## 2017-08-06 DIAGNOSIS — S4992XA Unspecified injury of left shoulder and upper arm, initial encounter: Secondary | ICD-10-CM | POA: Diagnosis not present

## 2017-08-06 DIAGNOSIS — I700 Atherosclerosis of aorta: Secondary | ICD-10-CM | POA: Diagnosis not present

## 2017-08-06 DIAGNOSIS — Z950 Presence of cardiac pacemaker: Secondary | ICD-10-CM | POA: Diagnosis not present

## 2017-08-06 DIAGNOSIS — M8580 Other specified disorders of bone density and structure, unspecified site: Secondary | ICD-10-CM | POA: Insufficient documentation

## 2017-08-06 DIAGNOSIS — Z9889 Other specified postprocedural states: Secondary | ICD-10-CM | POA: Insufficient documentation

## 2017-08-06 LAB — CBC WITH DIFFERENTIAL/PLATELET
Basophils Absolute: 0 10*3/uL (ref 0.0–0.1)
Basophils Relative: 0 %
Eosinophils Absolute: 0.3 10*3/uL (ref 0.0–0.7)
Eosinophils Relative: 3 %
HEMATOCRIT: 45.2 % (ref 39.0–52.0)
HEMOGLOBIN: 14.8 g/dL (ref 13.0–17.0)
Lymphocytes Relative: 8 %
Lymphs Abs: 0.9 10*3/uL (ref 0.7–4.0)
MCH: 30.1 pg (ref 26.0–34.0)
MCHC: 32.7 g/dL (ref 30.0–36.0)
MCV: 92.1 fL (ref 78.0–100.0)
MONO ABS: 0.8 10*3/uL (ref 0.1–1.0)
MONOS PCT: 7 %
NEUTROS ABS: 9.5 10*3/uL — AB (ref 1.7–7.7)
NEUTROS PCT: 82 %
Platelets: 214 10*3/uL (ref 150–400)
RBC: 4.91 MIL/uL (ref 4.22–5.81)
RDW: 14.4 % (ref 11.5–15.5)
WBC: 11.6 10*3/uL — ABNORMAL HIGH (ref 4.0–10.5)

## 2017-08-06 LAB — TROPONIN I: Troponin I: <0.03 ng/mL (ref <0.03)

## 2017-08-06 LAB — PROTIME-INR
INR: 1.83
Prothrombin Time: 21 seconds — ABNORMAL HIGH (ref 11.4–15.2)

## 2017-08-06 LAB — I-STAT CHEM 8, ED
BUN: 21 mg/dL — ABNORMAL HIGH (ref 6–20)
CALCIUM ION: 1.09 mmol/L — AB (ref 1.15–1.40)
CHLORIDE: 105 mmol/L (ref 101–111)
CREATININE: 1.1 mg/dL (ref 0.61–1.24)
GLUCOSE: 111 mg/dL — AB (ref 65–99)
HCT: 47 % (ref 39.0–52.0)
Hemoglobin: 16 g/dL (ref 13.0–17.0)
Potassium: 4.5 mmol/L (ref 3.5–5.1)
Sodium: 140 mmol/L (ref 135–145)
TCO2: 23 mmol/L (ref 22–32)

## 2017-08-06 LAB — BASIC METABOLIC PANEL
Anion gap: 10 (ref 5–15)
BUN: 18 mg/dL (ref 6–20)
CHLORIDE: 104 mmol/L (ref 101–111)
CO2: 23 mmol/L (ref 22–32)
CREATININE: 1.26 mg/dL — AB (ref 0.61–1.24)
Calcium: 8.5 mg/dL — ABNORMAL LOW (ref 8.9–10.3)
GFR calc non Af Amer: 50 mL/min — ABNORMAL LOW (ref >60)
GFR, EST AFRICAN AMERICAN: 58 mL/min — AB (ref >60)
GLUCOSE: 118 mg/dL — AB (ref 65–99)
Potassium: 4.3 mmol/L (ref 3.5–5.1)
Sodium: 137 mmol/L (ref 135–145)

## 2017-08-06 LAB — BRAIN NATRIURETIC PEPTIDE: B Natriuretic Peptide: 143.1 pg/mL — ABNORMAL HIGH (ref 0.0–100.0)

## 2017-08-06 LAB — I-STAT TROPONIN, ED: TROPONIN I, POC: 0 ng/mL (ref 0.00–0.08)

## 2017-08-06 MED ORDER — ASPIRIN 81 MG PO TABS: 81.0000 mg | ORAL_TABLET | Freq: Every day | ORAL | Status: DC

## 2017-08-06 MED ORDER — ACETAMINOPHEN 650 MG RE SUPP: 650.0000 mg | Freq: Four times a day (QID) | RECTAL | Status: DC | PRN

## 2017-08-06 MED ORDER — ACETAMINOPHEN 325 MG PO TABS: 650.0000 mg | ORAL_TABLET | Freq: Four times a day (QID) | ORAL | Status: DC | PRN

## 2017-08-06 MED ORDER — POLYETHYLENE GLYCOL 3350 POWD: 17.0000 g | Freq: Every day | Status: DC | PRN

## 2017-08-06 MED ORDER — FLUOXETINE HCL 10 MG PO CAPS: 10.0000 mg | ORAL_CAPSULE | Freq: Every day | ORAL | Status: DC

## 2017-08-06 MED ORDER — ASPIRIN 81 MG PO CHEW
81.0000 mg | CHEWABLE_TABLET | Freq: Every day | ORAL | Status: DC
  Administered 2017-08-06: 81 mg via ORAL
  Filled 2017-08-06: qty 1

## 2017-08-06 MED ORDER — PRAVASTATIN SODIUM 20 MG PO TABS
20.0000 mg | ORAL_TABLET | Freq: Every day | ORAL | Status: DC
  Administered 2017-08-06: 20 mg via ORAL
  Filled 2017-08-06: qty 1

## 2017-08-06 MED ORDER — LOSARTAN POTASSIUM 25 MG PO TABS
25.0000 mg | ORAL_TABLET | Freq: Two times a day (BID) | ORAL | Status: DC
  Administered 2017-08-07: 25 mg via ORAL
  Filled 2017-08-06: qty 1

## 2017-08-06 MED ORDER — WARFARIN SODIUM 7.5 MG PO TABS
7.5000 mg | ORAL_TABLET | Freq: Once | ORAL | Status: AC
  Administered 2017-08-06: 7.5 mg via ORAL
  Filled 2017-08-06 (×2): qty 1

## 2017-08-06 MED ORDER — WARFARIN - PHARMACIST DOSING INPATIENT: Freq: Every day | Status: DC

## 2017-08-06 MED ORDER — FUROSEMIDE 10 MG/ML IJ SOLN
40.0000 mg | Freq: Once | INTRAMUSCULAR | Status: AC
  Administered 2017-08-06: 40 mg via INTRAVENOUS
  Filled 2017-08-06: qty 4

## 2017-08-06 MED ORDER — NITROGLYCERIN 0.4 MG SL SUBL: 0.4000 mg | SUBLINGUAL_TABLET | SUBLINGUAL | Status: DC | PRN

## 2017-08-06 MED ORDER — FUROSEMIDE 40 MG PO TABS
40.0000 mg | ORAL_TABLET | Freq: Every day | ORAL | Status: DC
  Administered 2017-08-07: 40 mg via ORAL
  Filled 2017-08-06: qty 1

## 2017-08-06 MED ORDER — WARFARIN - PHARMACIST DOSING INPATIENT
Freq: Every day | Status: DC
Start: 2017-08-07 — End: 2017-08-07

## 2017-08-06 MED ORDER — GUAIFENESIN ER 600 MG PO TB12: 600.0000 mg | ORAL_TABLET | Freq: Two times a day (BID) | ORAL | Status: DC | PRN

## 2017-08-06 MED ORDER — ISOSORBIDE MONONITRATE ER 30 MG PO TB24
30.0000 mg | ORAL_TABLET | Freq: Every day | ORAL | Status: DC
  Administered 2017-08-07: 30 mg via ORAL
  Filled 2017-08-06: qty 1

## 2017-08-06 MED ORDER — WARFARIN SODIUM 5 MG PO TABS: 5.0000 mg | ORAL_TABLET | Freq: Every day | ORAL | Status: DC

## 2017-08-06 NOTE — ED Provider Notes (Signed)
[facility] EMERGENCY DEPARTMENT Provider Note   CSN: [id number] Arrival date & time: 08/06/17  5102     History   Chief Complaint Chief Complaint  Patient presents with  . Loss of Consciousness    HPI Levi [name] is a 83 y.o. male.  83 yo M with a chief complaint of a syncopal event.  The patient states that he was making breakfast and just started on the table and then spontaneously collapsed.  That he had no prodrome.  Woke up on the ground.  Has been having some shortness of breath secondary to nasal congestion.  Denies chest pain.  Has some lower extremity edema that he feels like it was at his baseline.  Has a history of heart failure.  Also has a history of sick sinus syndrome with a pacemaker.  Denies having a defibrillator.  Denies any exertional symptoms.   The history is provided by the patient.  Loss of Consciousness   This is a new problem. The current episode started less than 1 hour ago. The problem occurs rarely. The problem has been resolved. Associated symptoms include congestion. Pertinent negatives include abdominal pain, chest pain, confusion, fever, headaches, palpitations and vomiting. He has tried nothing for the symptoms. The treatment provided no relief.    Past Medical History:  Diagnosis Date  . Asthma   . Benign neoplasm of colon   . CAD (coronary artery disease) Dec 2011   s/p CABG per Dr. Roxy Manns; had normal EF; All SVGs occluded per follow up cath with only LIMA to LAD patent; s/p PCI of the proximal and mid LAD February 2014 per Dr. Burt Knack  . Dyspnea on exertion   . Edema   . Emphysema    "never treated for it" (10/05/2015)  . History of hiatal hernia   . HLD (hyperlipidemia)   . HTN (hypertension)   . Malaise and fatigue   . Obesity   . Osteopenia   . Pneumonia 1960s X 1; 1990s X 1  . Presence of permanent cardiac pacemaker   . Prostate cancer ([place])   . Skin cancer    "face, nose, top of ears, scalp"  . Symptomatic  bradycardia    STJ PPM, 10/05/15, Dr. Lovena Le    Patient Active Problem List   Diagnosis Date Noted  . Mobitz type 2 second degree atrioventricular block 10/05/2015  . Chronic anticoagulation 09/19/2015  . Bifascicular block 09/19/2015  . Bradycardia 09/19/2015  . Syncope 06/30/2014  . Near syncope 06/30/2014  . Lumbosacral spondylosis 02/21/2014  . Personal history of nicotine dependence 10/29/2013  . H/O coronary artery bypass surgery 10/22/2012  . Angina, class III ([place]) 10/15/2012  . CA of prostate ([place]) 07/09/2012  . Esophagitis, reflux 07/09/2012  . Allergic rhinitis 06/15/2012  . Back pain 10/24/2011  . CAD S/P PCI 2014 06/05/2011  . PAF (paroxysmal atrial fibrillation) ([place]) 09/20/2010  . OTHER SYMPTOMS INVOLVING CARDIOVASCULAR SYSTEM 09/20/2010  . PROSTATE CANCER 12/27/2009  . ADENOMATOUS COLONIC POLYP 12/27/2009  . HLD (hyperlipidemia) 12/27/2009  . Morbid obesity ([name]) 12/27/2009  . Essential hypertension 12/27/2009  . BRONCHITIS 12/27/2009  . EMPHYSEMA 12/27/2009  . OSTEOPENIA 12/27/2009  . MALAISE AND FATIGUE 12/27/2009  . EDEMA 12/27/2009  . Dyspnea on exertion 12/27/2009  . CHEST PAIN UNSPECIFIED 12/27/2009  . PAINFUL RESPIRATION 12/27/2009  . COLONIC POLYPS, ADENOMATOUS, HX OF 12/04/2009    Past Surgical History:  Procedure Laterality Date  . ANTERIOR CERVICAL DECOMP/DISCECTOMY FUSION    . BACK SURGERY    .  CATARACT EXTRACTION Right   . CORONARY ANGIOPLASTY WITH STENT PLACEMENT  10/14/12   with stent to proximal and mid LAD  . CORONARY ARTERY BYPASS GRAFT  08/23/10   "CABG X4"  . EP IMPLANTABLE DEVICE N/A 10/05/2015   Procedure: Pacemaker Implant;  Surgeon: [name], MD;  Location: [facility];  Service: Cardiovascular;  Laterality: N/A;  . EP IMPLANTABLE DEVICE N/A 10/06/2015   Procedure: Lead Revision/Repair;  Surgeon: [name], MD;  Location: [facility];  Service: Cardiovascular;  Laterality: N/A;  . INSERT / REPLACE /  REMOVE PACEMAKER  10/05/2015  . LEFT HEART CATHETERIZATION WITH CORONARY/GRAFT ANGIOGRAM N/A 09/01/2012   Procedure: LEFT HEART CATHETERIZATION WITH [name];  Surgeon: [name], MD;  Location: [facility] CATH LAB;  Service: Cardiovascular;  Laterality: N/A;  . MEDIAN STERNOTOMY  08/23/10  . MOHS SURGERY  "@ least twice"  . PACEMAKER INSERTION     STJ 10/05/15  . PERCUTANEOUS CORONARY STENT INTERVENTION (PCI-S) N/A 10/14/2012   Procedure: PERCUTANEOUS CORONARY STENT INTERVENTION (PCI-S);  Surgeon: [name], MD;  Location: [facility] CATH LAB;  Service: Cardiovascular;  Laterality: N/A;  . PROSTATE BIOPSY    . TRANSURETHRAL RESECTION OF PROSTATE         Home Medications    Prior to Admission medications   Medication Sig Start Date End Date Taking? Authorizing Provider  amLODipine (NORVASC) 2.5 MG tablet Take 1 tablet (2.5 mg total) by mouth 2 (two) times daily. 03/20/17  Yes [name], MD  aspirin 81 MG tablet Take 1 tablet (81 mg total) by mouth at bedtime. 01/06/15  Yes [name], MD  furosemide (LASIX) 40 MG tablet Take 1 tablet (40 mg total) by mouth daily. 01/17/17  Yes [name], MD  isosorbide mononitrate (IMDUR) 60 MG 24 hr tablet TAKE ONE-HALF TABLET BY  MOUTH DAILY 02/26/17  Yes [name], MD  losartan (COZAAR) 25 MG tablet Take 1 tablet (25 mg total) by mouth 2 (two) times daily. 01/17/17  Yes [name], MD  polyethylene glycol powder (GLYCOLAX/MIRALAX) powder Take 1 Container by mouth daily.  09/12/15  Yes [provider]  pravastatin (PRAVACHOL) 20 MG tablet TAKE 1 TABLET BY MOUTH  DAILY 02/26/17  Yes [name], MD  spironolactone (ALDACTONE) 25 MG tablet TAKE 1 TABLET BY MOUTH  DAILY 04/11/17  Yes [name], MD  warfarin (COUMADIN) 5 MG tablet TAKE 1 TABLET BY MOUTH  DAILY 06/19/17  Yes [name], MD  nitroGLYCERIN (NITROSTAT) 0.4 MG SL tablet Place 0.4 mg under the tongue every 5 (five) minutes x 3 doses as needed for chest pain.  08/09/13   [name], MD    Family History Family History  Problem Relation Age of Onset  . Kidney cancer Father   . Heart attack Father   . Hypertension Mother   . Stroke Mother   . Heart disease Brother   . Skin cancer Brother   . Prostate cancer Brother     Social History Social History   Tobacco Use  . Smoking status: Former Smoker    Packs/day: 1.00    Years: 30.00    Pack years: 30.00    Types: Cigarettes    Last attempt to quit: 09/07/1975    Years since quitting: 41.9  . Smokeless tobacco: Former Systems developer    Types: Chew    Quit date: 07/27/2015  Substance Use Topics  . Alcohol use: No  Alcohol/week: 0.0 oz  . Drug use: No     Allergies   Patient has no known allergies.   Review of Systems Review of Systems  Constitutional: Negative for chills and fever.  HENT: Positive for congestion. Negative for facial swelling.   Eyes: Negative for discharge and visual disturbance.  Respiratory: Positive for shortness of breath.   Cardiovascular: Positive for syncope. Negative for chest pain and palpitations.  Gastrointestinal: Negative for abdominal pain, diarrhea and vomiting.  Musculoskeletal: Negative for arthralgias and myalgias.  Skin: Negative for color change and rash.  Neurological: Positive for syncope. Negative for tremors and headaches.  Psychiatric/Behavioral: Negative for confusion and dysphoric mood.     Physical Exam Updated Vital Signs BP 120/67   Pulse 66   Resp 16   Ht 5\' 10"  (1.778 m)   Wt 122.9 kg (271 lb)   SpO2 97%   BMI 38.88 kg/m   Physical Exam  Constitutional: He is oriented to person, place, and time. He appears well-developed and well-nourished.  HENT:  Head: Normocephalic and atraumatic.  Eyes: EOM are normal. Pupils are equal, round, and reactive to light.  Neck: Normal range of motion. Neck supple. No JVD present.  Cardiovascular: Normal rate and regular rhythm. Exam reveals no gallop and no friction rub.  No murmur  heard. Pulmonary/Chest: No respiratory distress. He has no wheezes.  Abdominal: He exhibits no distension and no mass. There is no tenderness. There is no rebound and no guarding.  Musculoskeletal: Normal range of motion. He exhibits edema (2+ up to the knees).  Neurological: He is alert and oriented to person, place, and time.  Skin: No rash noted. No pallor.  Psychiatric: He has a normal mood and affect. His behavior is normal.  Nursing note and vitals reviewed.    ED Treatments / Results  Labs (all labs ordered are listed, but only abnormal results are displayed) Labs Reviewed  CBC WITH DIFFERENTIAL/PLATELET - Abnormal; Notable for the following components:      Result Value   WBC 11.6 (*)    Neutro Abs 9.5 (*)    All other components within normal limits  BASIC METABOLIC PANEL - Abnormal; Notable for the following components:   Glucose, Bld 118 (*)    Creatinine, Ser 1.26 (*)    Calcium 8.5 (*)    GFR calc non Af Amer 50 (*)    GFR calc Af Amer 58 (*)    All other components within normal limits  BRAIN NATRIURETIC PEPTIDE - Abnormal; Notable for the following components:   B Natriuretic Peptide 143.1 (*)    All other components within normal limits  PROTIME-INR - Abnormal; Notable for the following components:   Prothrombin Time 21.0 (*)    All other components within normal limits  I-STAT CHEM 8, ED - Abnormal; Notable for the following components:   BUN 21 (*)    Glucose, Bld 111 (*)    Calcium, Ion 1.09 (*)    All other components within normal limits  I-STAT TROPONIN, ED    EKG  EKG Interpretation  Date/Time:  Wednesday August 06 2017 08:34:38 EST Ventricular Rate:  70 PR Interval:    QRS Duration: 162 QT Interval:  454 QTC Calculation: 490 R Axis:   -74 Text Interpretation:  Normal sinus rhythm Ventricular bigeminy Nonspecific IVCD with LAD LVH with secondary repolarization abnormality similar morphology to prior Otherwise no significant change Confirmed  by Deno Etienne [phone]) on 08/06/2017 8:49:07 AM  Radiology Dg Chest 2 View  Result Date: 08/06/2017 CLINICAL DATA:  O awakened this morning and subsequently had a syncopal episode. Chronic reporting shortness of breath. History of coronary artery disease and CABG, asthma, cardiac dysrhythmia with pacemaker placement, former smoker. EXAM: CHEST  2 VIEW COMPARISON:  PA and lateral chest x-ray of October 07, 2015 FINDINGS: The lungs are well-expanded. The interstitial markings are coarse. There is no alveolar infiltrate. The cardiac silhouette is mildly enlarged but stable. The pulmonary vascularity is mildly engorged and less distinct than on the previous study. There are post CABG changes. The ICD is in reasonable position. A left atrial appendage clip is present. There is calcification in the wall of the aortic arch. IMPRESSION: Findings suggest low-grade pulmonary interstitial edema secondary to CHF. No alveolar pneumonia. Previous CABG.  Thoracic aortic atherosclerosis. Electronically Signed   By: David  Martinique M.D.   On: 08/06/2017 09:30   Ct Head Wo Contrast  Result Date: 08/06/2017 CLINICAL DATA:  Recent syncopal event EXAM: CT HEAD WITHOUT CONTRAST TECHNIQUE: Contiguous axial images were obtained from the base of the skull through the vertex without intravenous contrast. COMPARISON:  None. FINDINGS: Brain: Diffuse atrophic and mild chronic white matter ischemic changes are noted. Lacunar infarct is noted in the deep centrum semi ovale on the left in the frontal lobe. No findings to suggest acute hemorrhage, acute infarction or space-occupying mass lesion are noted. Vascular: No hyperdense vessel or unexpected calcification. Skull: Normal. Negative for fracture or focal lesion. Sinuses/Orbits: No acute finding. Other: None. IMPRESSION: Chronic atrophic and ischemic changes without acute abnormality. Electronically Signed   By: Inez Catalina M.D.   On: 08/06/2017 09:39   Dg Shoulder  Left  Result Date: 08/06/2017 CLINICAL DATA:  Recent fall with left shoulder pain, initial encounter EXAM: LEFT SHOULDER - 2+ VIEW COMPARISON:  None. FINDINGS: Degenerative changes of the acromioclavicular joint are seen. No fracture or dislocation is identified. The underlying bony thorax is within normal limits. A pacing device is noted over the left chest wall. IMPRESSION: Degenerative change without acute abnormality. Electronically Signed   By: Inez Catalina M.D.   On: 08/06/2017 10:11    Procedures Procedures (including critical care time)  Medications Ordered in ED Medications  furosemide (LASIX) injection 40 mg (not administered)     Initial Impression / Assessment and Plan / ED Course  I have reviewed the triage vital signs and the nursing notes.  Pertinent labs & imaging results that were available during my care of the patient were reviewed by me and considered in my medical decision making (see chart for details).     83 yo M with a chief complaint of a syncopal event.  This is concerning as the patient had no prodrome and dropped suddenly.  Will obtain a CT of the head to evaluate for intracranial hemorrhage injury.  Chest x-ray with the shortness of breath labs and reassess.  Patient likely needs to be admitted for syncope with congestive heart failure.  We will have the pacemaker interrogated.  Unable to interrogate the pacemaker bedside.  We will have the [place] paged.  I discussed the case with internal medicine residency service.  Will admit.  The patients results and plan were reviewed and discussed.   Any x-rays performed were independently reviewed by myself.   Differential diagnosis were considered with the presenting HPI.  Medications  furosemide (LASIX) injection 40 mg (not administered)    Vitals:   08/06/17 0845 08/06/17 0915 08/06/17  1015 08/06/17 1045  BP: (!) 102/51 120/64 (!) 107/58 120/67  Pulse: 71 79 70 66  Resp: (!) 21 18 14 16   SpO2:  94% 98% 97% 97%  Weight:      Height:        Final diagnoses:  Syncope and collapse  Acute pain of left shoulder  Acute post-traumatic headache, not intractable    Admission/ observation were discussed with the admitting physician, patient and/or family and they are comfortable with the plan.    Final Clinical Impressions(s) / ED Diagnoses   Final diagnoses:  Syncope and collapse  Acute pain of left shoulder  Acute post-traumatic headache, not intractable    ED Discharge Orders    None       Deno Etienne, DO 08/06/17 1127

## 2017-08-06 NOTE — ED Notes (Signed)
MD aware transmitter not working AD attempted to try. Advised if does not work will call [facility].

## 2017-08-06 NOTE — ED Notes (Signed)
[employer] [facility] at bedside

## 2017-08-06 NOTE — ED Notes (Signed)
Pt ambulated to restroom with 2 RN assistance. Pt was steady, but seemed weak. No complaints of pain or SOB with ambulation.

## 2017-08-06 NOTE — H&P (Signed)
Date: 08/06/2017               Patient Name:  Levi [name] MRN: [id number]  DOB: July 19, 1929 Age / Sex: 83 y.o., male   PCP: Bernerd Limbo, MD         Medical Service: Internal Medicine Teaching Service         Attending Physician: Dr. Sid Falcon, MD    First Contact: Dr. Heber Cable Pager: [phone]  Second Contact: Dr. Berline Lopes Pager: [phone]       After Hours (After 5p/  First Contact Pager: [phone]  weekends / holidays): Second Contact Pager: [phone]   Chief Complaint: Syncope and collapse  History of Present Illness: Levi [name] is an 83 y/o man with a personal history of HFpEF, afib on coumadin, symptomatic bradycardia s/p STJ PPM in 2017, and CAD with previous CABG in 2011, subsequent PCI 2014 for LAD stenosis, last assessed by [facility] June 2018 at [facility] with medical management recommended for diffuse disease. He also has hypertension, hyperlipidemia, and OSA. He was preparing breakfast this morning when he abruptly lost consciousness and awoke on the ground within minutes later. He was found by his daughter to be unresponsive and trying unsuccessfully to climb back to his feet. She reports initially his color appeared very gray but improved. He apparently struck the back of his head and left shoulder based on pain and contusions. EMS arrived on seen and reportedly he was hypotensive although unsure of exact numbers. He was transported to [facility] for evaluation of this syncopal event.  Initially his device was unable to be read so pacemaker device rep was contacted. Initial troponin was negative and EKG showed no acute changes suggesting ischemia or tachyarrhythmia. His renal function is appropriate. BNP is slightly elevated at 143.1 although not out or proportion to previous values. Head CT showed no evidence of ICH. No fracture was seen on left shoulder xray. Chest xray indicates diffuse interstitial edema without pleural effusions, focal consolidations, or  obvious rib fractures.  He has never felt a similar episode of syncope in the past. He has been feeling congestion like a bad cold which started last night. He denies any prodrome or other changes preceding this event. He remains somewhat short of breath although not hypoxic and does have baseline dyspnea with exertion. He has lower extremity swelling but feels it is stable. He has lost 10 pounds this year but no abrupt change in the past month.  Meds:  Current Meds  Medication Sig  . amLODipine (NORVASC) 2.5 MG tablet Take 1 tablet (2.5 mg total) by mouth 2 (two) times daily.  Marland Kitchen aspirin 81 MG tablet Take 1 tablet (81 mg total) by mouth at bedtime.  . furosemide (LASIX) 40 MG tablet Take 1 tablet (40 mg total) by mouth daily.  . isosorbide mononitrate (IMDUR) 60 MG 24 hr tablet TAKE ONE-HALF TABLET BY  MOUTH DAILY  . losartan (COZAAR) 25 MG tablet Take 1 tablet (25 mg total) by mouth 2 (two) times daily.  . Polyethylene Glycol 3350 POWD polyethylene glycol 3350 17 gram/dose oral powder  . polyethylene glycol powder (GLYCOLAX/MIRALAX) powder Take 1 Container by mouth daily.   . pravastatin (PRAVACHOL) 20 MG tablet TAKE 1 TABLET BY MOUTH  DAILY  . spironolactone (ALDACTONE) 25 MG tablet TAKE 1 TABLET BY MOUTH  DAILY  . warfarin (COUMADIN) 5 MG tablet TAKE 1 TABLET BY MOUTH  DAILY     Allergies: Allergies as of 08/06/2017  . (  No Known Allergies)   Past Medical History:  Diagnosis Date  . Asthma   . Benign neoplasm of colon   . CAD (coronary artery disease) Dec 2011   s/p CABG per Dr. Roxy Manns; had normal EF; All SVGs occluded per follow up cath with only LIMA to LAD patent; s/p PCI of the proximal and mid LAD February 2014 per Dr. Burt Knack  . Dyspnea on exertion   . Edema   . Emphysema    "never treated for it" (10/05/2015)  . History of hiatal hernia   . HLD (hyperlipidemia)   . HTN (hypertension)   . Malaise and fatigue   . Obesity   . Osteopenia   . Pneumonia 1960s X 1; 1990s X 1  .  Presence of permanent cardiac pacemaker   . Prostate cancer ([place])   . Skin cancer    "face, nose, top of ears, scalp"  . Symptomatic bradycardia    STJ PPM, 10/05/15, Dr. Lovena Le    Family History: Family History  Problem Relation Age of Onset  . Kidney cancer Father   . Heart attack Father   . Hypertension Mother   . Stroke Mother   . Heart disease Brother   . Skin cancer Brother   . Prostate cancer Brother     Social History: Socioeconomic History  . Marital status: Widowed    Spouse name: None  . Number of children: None  . Years of education: None  . Highest education level: None  Occupational History  . Occupation: retired Information systems manager: RETIRED  Tobacco Use  . Smoking status: Former Smoker    Packs/day: 1.00    Years: 30.00    Pack years: 30.00    Types: Cigarettes    Last attempt to quit: 09/07/1975    Years since quitting: 41.9  . Smokeless tobacco: Former Systems developer    Types: [place] date: 07/27/2015  Substance and Sexual Activity  . Alcohol use: No    Alcohol/week: 0.0 oz  . Drug use: No  . Sexual activity: Not Currently    Review of Systems: A complete ROS was negative except as per HPI. Review of Systems  Constitutional: Negative for chills and fever.  HENT: Negative for sore throat.   Eyes: Negative for blurred vision.  Respiratory: Positive for shortness of breath. Negative for hemoptysis and wheezing.   Cardiovascular: Positive for leg swelling. Negative for chest pain.  Gastrointestinal: Negative for abdominal pain, blood in stool, constipation and diarrhea.  Genitourinary: Negative for frequency.  Musculoskeletal: Positive for falls and joint pain.  Skin: Negative for rash.  Neurological: Positive for loss of consciousness. Negative for focal weakness and seizures.  Psychiatric/Behavioral: Negative for substance abuse.     Physical Exam: Blood pressure 105/60, pulse 75, resp. rate 18, height 5\' 10"  (1.778 m), weight 271 lb (122.9  kg), SpO2 93 %. GENERAL- alert, co-operative, NAD HEENT- Back of head tender without significant ecchymoses, fair dentition, no oropharyngeal erythema, no cervical LN enlargement, no obvious JVD in obese neck. CARDIAC- RRR, no murmurs, rubs or gallops. RESP- Bibasilar inspiratory crackles present, good air movement without wheezing ABDOMEN- Soft, nontender, no guarding or rebound NEURO- No obvious Cr N abnormality, strength upper and lower extremities- 5/5 although somewhat fatigued with exertion EXTREMITIES- Symmetric, 1+ pitting edema in lower extremities SKIN- Warm, dry, No rash or lesion. PSYCH- Normal mood and affect, appropriate thought content and speech.   EKG: personally reviewed my interpretation is regular sinus rhythm  with widened QRS complex, possible new baseline atrial flutter waves visible in anterior leads  CXR: personally reviewed my interpretation is central vascular congestion  Assessment & Plan by Problem: Syncope of unknown etiology His story is concerning for cardiogenic syncope as he fell from a stable standing position with no prodrome. He may have had transient hypotension. His history is high risk with heart failure, heart block, and nonoperable CAD. Assuming proper pacemaker functionality this should not have been caused by symptomatic bradycardia. He does not have significantly reduced LVEF based on 2017 TTE but he could still be at risk for sudden ventricular tachycardia secondary to ischemic heart disease. We will trend cardiac enzymes and repeat AM EKG. Possible aflutter on EKG could be related to a tachyarrhythmia. Non-cardiac causes could include orthostatic hypotension or situational syncope as he is on multiple vasodilators for pre- and afterload reduction. He has suggestions of mildly worsened heart failure but is not hypoxic or complaining of preceding dyspnea. There were no concerning metabolic derangements. If he does continue to appear less compensated for  heart failure consider workup with new TTE as well. -Need PPM report, device rep contacted by ED -Admit for observation on telemetry -Orthostatic vital signs -Repeat AM CBC, BMP -Repeat EKG in AM or if symptoms recur -Challenge ambulation tomorrow with nursing if workup remains benign  Heart failure with preserved ejection fraction He currently has moderate pedal edema and evidence of pulmonary vascular congestion on examination and chest xray. However there is no recent weight change and BNP is only mildly elevated so this may be mildly worsened but not a cause of syncope. -Extra lasix 40mg  IV today -Resume home 40mg  daily tomorrow -If increased dyspnea persists and cannot ambulate at baseline tomorrow consider new TTE and medication titration  HLD (hyperlipidemia) -Continue pravastatin 20mg   Essential hypertension Blood pressure is well controlled today on his home medications. He may have suffered transient hypotension prior to arrival and evaluation. -Continue home losartan, imdur -Hold home amlodipine 2.5mg  and spironolactone 25mg  for today -Reassess restarting tomorrow -Check orthostatic vital signs on admission  PAF (paroxysmal atrial fibrillation) (HCC) Chronic anticoagulation EKG today is suggestive of flutter rather than fibrillation. Heart rate is stable in 70s. He is mildly subtherapeutic INR today at 1.83 but does not have evidence of acute stroke or significant PE. -Continue warfarin 5mg  qHS -Pharmacy consulted for inpatient dose management and monitoring  CAD S/P PCI 2014 H/O coronary artery bypass surgery Bifascicular block Symptomatic bradycardia s/p St Jude pacemaker implantation He has severe and diffuse coronary artery disease. This includes occlusion of all SVGs and significant mid and proximal LAD stenosis. There are no changes of acute ischemia suggested on current EKG. Troponin is negative. He does have anginal equivalent dyspnea or sometimes chest pressure  with exertion and can tolerate only very short distances at baseline. -Continue ASA 81mg  -Trend troponin I q6hrs x3 -SL nitroglycerin PRN for chest pain  Depression, reactive This seems to be related to decreased functional status from his progressive cardiovascular disease. Not in exacerbation. -Continue home fluoxetine 10mg   Dispo: Admit patient to Observation with expected length of stay less than 2 midnights.  Signed: Collier Salina, MD PGY-III Internal Medicine Resident Pager# [phone] 08/06/2017, 1:49 PM

## 2017-08-06 NOTE — ED Triage Notes (Signed)
Patient presents to the ED from home with complaints of syncope. Reports woke this morning to fix breakfast. And remembers  Hitting the floor. Patient denies LOC. EMS reports he was unresponsive on arrival. Patient denies any chest pain, N/V. Complaints of Shortness of breathe. Patient alert and oriented x4. Patient moving all extremities. Patient reports hit his head on dishwasher and reports on blood thinner. No injury noted on arrival,

## 2017-08-06 NOTE — Progress Notes (Signed)
ANTICOAGULATION CONSULT NOTE - Initial Consult  Pharmacy Consult for warfarin Indication: atrial fibrillation  No Known Allergies  Patient Measurements: Height: 5\' 10"  (177.8 cm) Weight: 271 lb (122.9 kg) IBW/kg (Calculated) : 73  Vital Signs: BP: 112/67 (12/05 1230) Pulse Rate: 74 (12/05 1230)  Labs: Recent Labs    08/06/17 0840 08/06/17 1036  HGB 14.8 16.0  HCT 45.2 47.0  PLT 214  --   LABPROT 21.0*  --   INR 1.83  --   CREATININE 1.26* 1.10    Estimated Creatinine Clearance: 63.4 mL/min (by C-G formula based on SCr of 1.1 mg/dL).   Medical History: Past Medical History:  Diagnosis Date  . Asthma   . Benign neoplasm of colon   . CAD (coronary artery disease) Dec 2011   s/p CABG per Dr. Roxy Manns; had normal EF; All SVGs occluded per follow up cath with only LIMA to LAD patent; s/p PCI of the proximal and mid LAD February 2014 per Dr. Burt Knack  . Dyspnea on exertion   . Edema   . Emphysema    "never treated for it" (10/05/2015)  . History of hiatal hernia   . HLD (hyperlipidemia)   . HTN (hypertension)   . Malaise and fatigue   . Obesity   . Osteopenia   . Pneumonia 1960s X 1; 1990s X 1  . Presence of permanent cardiac pacemaker   . Prostate cancer ([place])   . Skin cancer    "face, nose, top of ears, scalp"  . Symptomatic bradycardia    STJ PPM, 10/05/15, Dr. Lovena Le    Assessment: 67 yom presented to the ED with loss of consciousness. He is on chronic warfarin for history of afib. INR is subtherapeutic today at 1.83. CBC is WNL and no bleeding noted.   Goal of Therapy:  INR 2-3 Monitor platelets by anticoagulation protocol: Yes   Plan:  Warfarin 7.5mg  PO x 1 today Daily INR  Jamian Andujo, Rande Lawman 08/06/2017,12:56 PM

## 2017-08-07 DIAGNOSIS — F329 Major depressive disorder, single episode, unspecified: Secondary | ICD-10-CM | POA: Diagnosis not present

## 2017-08-07 DIAGNOSIS — I2510 Atherosclerotic heart disease of native coronary artery without angina pectoris: Secondary | ICD-10-CM | POA: Diagnosis not present

## 2017-08-07 DIAGNOSIS — I480 Paroxysmal atrial fibrillation: Secondary | ICD-10-CM | POA: Diagnosis not present

## 2017-08-07 DIAGNOSIS — E785 Hyperlipidemia, unspecified: Secondary | ICD-10-CM | POA: Diagnosis not present

## 2017-08-07 DIAGNOSIS — Z9861 Coronary angioplasty status: Secondary | ICD-10-CM | POA: Diagnosis not present

## 2017-08-07 DIAGNOSIS — I5030 Unspecified diastolic (congestive) heart failure: Secondary | ICD-10-CM | POA: Diagnosis not present

## 2017-08-07 DIAGNOSIS — M25512 Pain in left shoulder: Secondary | ICD-10-CM | POA: Diagnosis not present

## 2017-08-07 DIAGNOSIS — K210 Gastro-esophageal reflux disease with esophagitis: Secondary | ICD-10-CM | POA: Diagnosis not present

## 2017-08-07 DIAGNOSIS — Z951 Presence of aortocoronary bypass graft: Secondary | ICD-10-CM

## 2017-08-07 DIAGNOSIS — I452 Bifascicular block: Secondary | ICD-10-CM | POA: Diagnosis not present

## 2017-08-07 DIAGNOSIS — I110 Hypertensive heart disease with heart failure: Secondary | ICD-10-CM | POA: Diagnosis not present

## 2017-08-07 DIAGNOSIS — I5032 Chronic diastolic (congestive) heart failure: Secondary | ICD-10-CM | POA: Diagnosis not present

## 2017-08-07 DIAGNOSIS — R55 Syncope and collapse: Secondary | ICD-10-CM | POA: Diagnosis not present

## 2017-08-07 DIAGNOSIS — G44319 Acute post-traumatic headache, not intractable: Secondary | ICD-10-CM | POA: Diagnosis not present

## 2017-08-07 DIAGNOSIS — G3189 Other specified degenerative diseases of nervous system: Secondary | ICD-10-CM | POA: Diagnosis not present

## 2017-08-07 DIAGNOSIS — Z7901 Long term (current) use of anticoagulants: Secondary | ICD-10-CM | POA: Diagnosis not present

## 2017-08-07 LAB — CBC
HEMATOCRIT: 46.4 % (ref 39.0–52.0)
HEMOGLOBIN: 15.2 g/dL (ref 13.0–17.0)
MCH: 30.2 pg (ref 26.0–34.0)
MCHC: 32.8 g/dL (ref 30.0–36.0)
MCV: 92.1 fL (ref 78.0–100.0)
Platelets: 220 10*3/uL (ref 150–400)
RBC: 5.04 MIL/uL (ref 4.22–5.81)
RDW: 14.6 % (ref 11.5–15.5)
WBC: 10.2 10*3/uL (ref 4.0–10.5)

## 2017-08-07 LAB — PROTIME-INR
INR: 2
Prothrombin Time: 22.5 seconds — ABNORMAL HIGH (ref 11.4–15.2)

## 2017-08-07 LAB — BASIC METABOLIC PANEL
ANION GAP: 10 (ref 5–15)
BUN: 25 mg/dL — ABNORMAL HIGH (ref 6–20)
CO2: 26 mmol/L (ref 22–32)
Calcium: 8.9 mg/dL (ref 8.9–10.3)
Chloride: 103 mmol/L (ref 101–111)
Creatinine, Ser: 1.27 mg/dL — ABNORMAL HIGH (ref 0.61–1.24)
GFR calc Af Amer: 57 mL/min — ABNORMAL LOW (ref >60)
GFR calc non Af Amer: 49 mL/min — ABNORMAL LOW (ref >60)
Glucose, Bld: 102 mg/dL — ABNORMAL HIGH (ref 65–99)
POTASSIUM: 4.4 mmol/L (ref 3.5–5.1)
SODIUM: 139 mmol/L (ref 135–145)

## 2017-08-07 NOTE — Progress Notes (Signed)
Subjective: Patient was able to his bed today point in the room.  He stated that he was interest to go home denied dizziness, chest pain, palpitations, abdominal pain, or syncope like event.  Patient denies episode overnight denies fever, chills, nausea, vomiting, constipation, or diarrhea.  He is anxious to go home.  Objective:  Vital signs in last 24 hours: Vitals:   08/06/17 1627 08/06/17 1932 08/07/17 0100 08/07/17 0638  BP: (!) 142/65 (!) 90/58 (!) 133/54 (!) 154/74  Pulse: 72 74 70 71  Resp:  18 18 18   Temp: 98.7 F (37.1 C) 98.6 F (37 C)  (!) 97.4 F (36.3 C)  TempSrc: Oral Oral  Oral  SpO2: 100% 100% 98% 98%  Weight: 261 lb 2 oz (118.4 kg)   262 lb 9.6 oz (119.1 kg)  Height: 5\' 10"  (1.778 m)      ROS negative except as per HPI.  Physical Exam  Constitutional: He appears well-developed and well-nourished. No distress.  Cardiovascular: Normal rate and regular rhythm.  Pulmonary/Chest: Effort normal and breath sounds normal. No respiratory distress. He has no wheezes.  Abdominal: Soft. Bowel sounds are normal. He exhibits no distension.  Musculoskeletal: He exhibits no edema or tenderness.  Nursing note and vitals reviewed.   Assessment/Plan:  Principal Problem:   Syncope Active Problems:   HLD (hyperlipidemia)   Essential hypertension   Heart failure with preserved ejection fraction (HCC)   PAF (paroxysmal atrial fibrillation) (HCC)   CAD S/P PCI 2014   H/O coronary artery bypass surgery   Gastro-esophageal reflux disease with esophagitis   Chronic anticoagulation   Bifascicular block   Depression, reactive  Syncopeof unknown etiology High concern for cardiac origin given his presentation and history of SSS with pacemaker. According to family the representative from the [facility] for the pacemaker was at bedside and stated that the device was functioning but no official report has been filed and no record of this was present in the chart as of this morning.  We will need to confirm their evaluation. Concern is highest for tachy arrhythmia given history and sudden onset without prodrome and not related to exertion.  -TTE may be considered given audible murmur as a structural abnormality may also lead to this presentation, but is less likely given the hisotry.  -Need PPM report, device rep contacted by ED-nothing left in chart -continue telemetry  -Orthostatic vital signs ordred -Repeated CBC, BMP relatively unremarkable this am,  -Challenge ambulation today with nursing  Heart failure with preserved ejection fraction He currently has moderate pedal edema and evidence of pulmonary vascular congestion on examination and chest xray. However, there is no recent weight change and BNP is only mildly elevated so this may be mildly worsened but not a cause of syncope. -Extra lasix 40mg  IV the prior day -Continue 40mg  PO lasix  HLD (hyperlipidemia): -Continue pravastatin 20mg   Essential hypertension Blood pressure is well controlled today on his home medications. He may have suffered transient hypotension prior to arrival and evaluation. -Continue home losartan, imdur -Hold home amlodipine 2.5mg  and spironolactone 25mg  -May need to restart antihypertensives today, we will be unable to properly assess their affect on his orthostatics now that he is off his medications  -Check orthostatic vital signs today  PAF (paroxysmal atrial fibrillation) (HCC) Chronic anticoagulation EKG the prior day was suggestive of flutter rather than fibrillation which is possible with SSS. Heart rate is stable in 70s. He is mildly subtherapeutic INR today at 2.00 but does not  have evidence of acute stroke or significant PE. -Continue warfarin 5mg  qHS -Pharmacy consulted for inpatient dose management and monitoring therapeutic today  CAD S/P PCI 2014 H/O coronary artery bypass surgery Bifascicular block Symptomatic bradycardia s/p St Jude pacemaker implantation He  has severe and diffuse coronary artery disease. This includes occlusion of all SVGs and significant mid and proximal LAD stenosis. There are no changes of acute ischemia suggested on current EKG. Troponin is negative. He does have anginal equivalent dyspnea or sometimes chest pressure with exertion and can tolerate only very short distances at baseline. -Continue ASA 81mg  -Trend troponin I q6hrs x3 -SL nitroglycerin PRN for chest pain  Depression, reactive This seems to be related to decreased functional status from his progressive cardiovascular disease. Not in exacerbation. -Continue home fluoxetine 10mg   Dispo: Anticipated discharge in approximately 1-2 day(s).   Kathi Ludwig, MD 08/07/2017, 8:58 AM Pager: Pager# [phone]

## 2017-08-07 NOTE — Progress Notes (Signed)
ANTICOAGULATION CONSULT NOTE Pharmacy Consult for warfarin Indication: atrial fibrillation  No Known Allergies  Patient Measurements: Height: 5\' 10"  (177.8 cm) Weight: 262 lb 9.6 oz (119.1 kg)(scale b) IBW/kg (Calculated) : 73  Vital Signs: Temp: 97.4 F (36.3 C) (12/06 0638) Temp Source: Oral (12/06 0638) BP: 154/74 (12/06 5797) Pulse Rate: 71 (12/06 0638)  Labs: Recent Labs    08/06/17 0840 08/06/17 1036 08/06/17 1315 08/06/17 1738 08/07/17 0553  HGB 14.8 16.0  --   --  15.2  HCT 45.2 47.0  --   --  46.4  PLT 214  --   --   --  220  LABPROT 21.0*  --   --   --  22.5*  INR 1.83  --   --   --  2.00  CREATININE 1.26* 1.10  --   --  1.27*  TROPONINI  --   --  <0.03 <0.03  --     Estimated Creatinine Clearance: 54 mL/min (A) (by C-G formula based on SCr of 1.27 mg/dL (H)).   Assessment: 7 yom presented to the ED with loss of consciousness. He is on chronic warfarin for history of afib.   INR = 2 today  Dose prior to admission = 5 mg po daily  Goal of Therapy:  INR 2-3 Monitor platelets by anticoagulation protocol: Yes   Plan:  Repeat Warfarin 7.5mg  PO x 1 today Daily INR  Thank you Anette Guarneri, PharmD [phone] 08/07/2017,8:19 AM

## 2017-08-07 NOTE — Discharge Instructions (Signed)
Please call your cardiologist and primary care physician and schedule appointments with them in the next 4-6 days.        Atrial Fibrillation Atrial fibrillation is a type of heartbeat that is irregular or fast (rapid). If you have this condition, your heart keeps quivering in a weird (chaotic) way. This condition can make it so your heart cannot pump blood normally. Having this condition gives a person more risk for stroke, heart failure, and other heart problems. There are different types of atrial fibrillation. Talk with your doctor to learn about the type that you have. Follow these instructions at home:  Take over-the-counter and prescription medicines only as told by your doctor.  If your doctor prescribed a blood-thinning medicine, take it exactly as told. Taking too much of it can cause bleeding. If you do not take enough of it, you will not have the protection that you need against stroke and other problems.  Do not use any tobacco products. These include cigarettes, chewing tobacco, and e-cigarettes. If you need help quitting, ask your doctor.  If you have apnea (obstructive sleep apnea), manage it as told by your doctor.  Do not drink alcohol.  Do not drink beverages that have caffeine. These include coffee, soda, and tea.  Maintain a healthy weight. Do not use diet pills unless your doctor says they are safe for you. Diet pills may make heart problems worse.  Follow diet instructions as told by your doctor.  Exercise regularly as told by your doctor.  Keep all follow-up visits as told by your doctor. This is important. Contact a doctor if:  You notice a change in the speed, rhythm, or strength of your heartbeat.  You are taking a blood-thinning medicine and you notice more bruising.  You get tired more easily when you move or exercise. Get help right away if:  You have pain in your chest or your belly (abdomen).  You have sweating or weakness.  You feel sick to  your stomach (nauseous).  You notice blood in your throw up (vomit), poop (stool), or pee (urine).  You are short of breath.  You suddenly have swollen feet and ankles.  You feel dizzy.  Your suddenly get weak or numb in your face, arms, or legs, especially if it happens on one side of your body.  You have trouble talking, trouble understanding, or both.  Your face or your eyelid droops on one side. These symptoms may be an emergency. Do not wait to see if the symptoms will go away. Get medical help right away. Call your local emergency services (911 in the [place] Do not drive yourself to the hospital. This information is not intended to replace advice given to you by your health care provider. Make sure you discuss any questions you have with your health care provider. Document Released: 05/28/2008 Document Revised: 01/25/2016 Document Reviewed: 12/14/2014 Elsevier Interactive Patient Education  [employer].

## 2017-08-07 NOTE — Progress Notes (Signed)
Pt d/c home with daughter per MD order, pt VSS, pt verbalized understanding of d/c, all questions answered

## 2017-08-07 NOTE — Progress Notes (Signed)
  Date: 08/07/2017  Patient name: Levi [name]  Medical record number: [id number]  Date of birth: 1929/03/16   I have seen and evaluated this patient and I have discussed the plan of care with the house staff. Please see Dr. Nelma Rothman note for complete details. I concur with his findings with the following additions/corrections:   We reviewed [name] telemetry, no acute events overnight.  We received the [employer] report which showed persistent Afib for the last 4 months or so.  It is difficult to tell if he has had tachycardia, but while he has been here his HR has been in the 70s.  DDx includes orthostasis (currently not positive, but we held his amlodipine and spironolactone) vs. Afib with RVR leading to low BP.  I do not think it would be appropriate to start a Beta blocker with his controlled HR at this time.  He is feeling much better off of 2 anti-hypertensives and his BP has been well controlled.  Patient and son are requesting to go home today and do not feel further inpatient work up is warranted at this time.  I am not sure we have a clear answer, but my discharge plan would be to continue to hold spironolactone/amlodipine and to have him follow up closely with his cardiologist.  It seems he has not seen Dr. Harrington Challenger since May based on my review of charting and has not seen his Cardiologist at [facility] since August.  Would recommend very close Cardiology follow up if he goes home today.   Sid Falcon, MD 08/07/2017, 3:54 PM

## 2017-08-07 NOTE — Discharge Summary (Signed)
Name: Levi [name] MRN: [id number] DOB: 1929/07/27 83 y.o. PCP: Bernerd Limbo, MD  Date of Admission: 08/06/2017  8:21 AM Date of Discharge: 08/08/2017 Attending Physician: No att. providers found  Discharge Diagnosis: Principal Problem:   Syncope Active Problems:   HLD (hyperlipidemia)   Essential hypertension   Heart failure with preserved ejection fraction (HCC)   PAF (paroxysmal atrial fibrillation) ([place])   CAD S/P PCI 2014   H/O coronary artery bypass surgery   Gastro-esophageal reflux disease with esophagitis   Chronic anticoagulation   Bifascicular block   Depression, reactive   Discharge Medications: Allergies as of 08/07/2017   No Known Allergies     Medication List    STOP taking these medications   amLODipine 2.5 MG tablet Commonly known as:  NORVASC   spironolactone 25 MG tablet Commonly known as:  ALDACTONE     TAKE these medications   aspirin 81 MG tablet Take 1 tablet (81 mg total) by mouth at bedtime.   FLUoxetine 10 MG capsule Commonly known as:  PROZAC fluoxetine 10 mg capsule   furosemide 40 MG tablet Commonly known as:  LASIX Take 1 tablet (40 mg total) by mouth daily.   isosorbide mononitrate 60 MG 24 hr tablet Commonly known as:  IMDUR TAKE ONE-HALF TABLET BY  MOUTH DAILY   losartan 25 MG tablet Commonly known as:  COZAAR Take 1 tablet (25 mg total) by mouth 2 (two) times daily.   neomycin-polymyxin-dexameth 0.1 % Oint Commonly known as:  MAXITROL neomycin 3.5 mg/g-polymyxin B 10,000 unit/g-dexameth 0.1 % eye oint   nitroGLYCERIN 0.4 MG SL tablet Commonly known as:  NITROSTAT Place 0.4 mg under the tongue every 5 (five) minutes x 3 doses as needed for chest pain.   Polyethylene Glycol 3350 Powd polyethylene glycol 3350 17 gram/dose oral powder   polyethylene glycol powder powder Commonly known as:  GLYCOLAX/MIRALAX Take 1 Container by mouth daily.   pravastatin 20 MG tablet Commonly known as:  PRAVACHOL TAKE 1 TABLET  BY MOUTH  DAILY   tamsulosin 0.4 MG Caps capsule Commonly known as:  FLOMAX   warfarin 5 MG tablet Commonly known as:  COUMADIN Take as directed. If you are unsure how to take this medication, talk to your nurse or doctor. Original instructions:  TAKE 1 TABLET BY MOUTH  DAILY       Disposition and follow-up:   Mr.Levi [name] was discharged from [facility] in Stable condition.  At the hospital follow up visit please address:  1.  -Admitted for syncope thought to most likely be secondary to his SVT. He was noted to be in A-flutter on EKG in the ED with the pacemaker rep from [place] that he was in A-fib prior. He self converted and no abnormal pacemaker function was noted.     -We would like for his cardiologist and PCP to assess him for rate control agents given his syncope. In addition the report from [place] indicated that he has been in A-fib/flutter for the majority of the past few months with minimal pacemaker activity needed. The patient attested to presyncopal symptoms since August as well.      -Given the concern for orthostatic hypotension his home amlodipine and spironolactone were held until he could follow-up in the outpatient setting.   2.  Labs / imaging needed at time of follow-up: Pacemaker interrogation   3.  Pending labs/ test needing follow-up: n/a  Follow-up Appointments: Follow-up Information  Bernerd Limbo, MD. Daphane Shepherd on 08/19/2017.   Specialty:  Family Medicine Why:  @2pm  Contact information: [address] [place] [address] [phone]        [name], MD Follow up.   Specialty:  Cardiology Why:  As soon as possible. Within 4-6 days. Contact information: [place] [address] [phone]           Hospital Course by problem list: Principal Problem:   Syncope Active Problems:   HLD (hyperlipidemia)   Essential hypertension    Heart failure with preserved ejection fraction (HCC)   PAF (paroxysmal atrial fibrillation) (HCC)   CAD S/P PCI 2014   H/O coronary artery bypass surgery   Gastro-esophageal reflux disease with esophagitis   Chronic anticoagulation   Bifascicular block   Depression, reactive   1. Syncope: High concern for cardiac origin given his presentation and history of sick sinus syndrome with pacemaker.  After observing patient's pacemaker report from [place].  I am concerned that he has has had increased episodes of atrial fibrillation since August.  As such this would adequately explain his presyncopal symptoms since that time and recent syncope given the documented A-flutter on EKG on admission.  In addition CT of the head was negative for acute intracranial process,= and there was no evidence of prominent cardiac valve malformation echo in 06/20/2016.  His symptoms were not consistent with seizure-like activity.  However this could be explained by vasovagal episode and/or orthostatic hypotension.  As such his antihypertensive medication regimen was adjusted appropriately as it is continuation of the amlodipine and spironolactone.  We would recommend consideration of a rate control medication but defer this choice to his outpatient PCP and/or cardiologist.  2. Hypertension: Patient was hypertensive on discharge. However, this is most likely secondary to the withholding of his amlodipine and spironolactone.  As his blood pressure medications were held on admission and the orthostatic studies were completed the following day (and were negative), they may not be consistent with his previous orthostasis while on his former home antihypertensive medication regimen.  As such medications were held on discharge.  Please reassess the need for continued treatment with these medications versus rate control based on your knowledge and experience with this patient.  3. HFpEF: Although the patient had moderate pedal  edema and noted dense of pulmonary vascular congestion on examination chest x-ray there is no indication that this is attributed to his admission.  There is no significant weight change, his BNP was only mildly elevated this did not appear to be associated with syncope.  He was continued on his home dose of Lasix 40 mg by mouth after receiving an additional dose of 40 mg IV on the day of admission. He was discharged home on furosemide 40mg  daily  No significant changes were made but the remainder of the patient's medications.  Discharge Vitals:   BP (!) 154/74 (BP Location: Right Arm)   Pulse 71   Temp (!) 97.4 F (36.3 C) (Oral)   Resp 18   Ht 5\' 10"  (1.778 m)   Wt 262 lb 9.6 oz (119.1 kg) Comment: scale b  SpO2 98%   BMI 37.68 kg/m   Pertinent Labs, Studies, and Procedures:  CMP Latest Ref Rng & Units 08/07/2017 08/06/2017 08/06/2017  Glucose 65 - 99 mg/dL 102(H) 111(H) 118(H)  BUN 6 - 20 mg/dL 25(H) 21(H) 18  Creatinine 0.61 - 1.24 mg/dL 1.27(H) 1.10 1.26(H)  Sodium 135 - 145 mmol/L 139 140 137  Potassium 3.5 - 5.1 mmol/L 4.4 4.5 4.3  Chloride 101 - 111 mmol/L 103 105 104  CO2 22 - 32 mmol/L 26 - 23  Calcium 8.9 - 10.3 mg/dL 8.9 - 8.5(L)  Total Protein 6.0 - 8.3 g/dL - - -  Total Bilirubin 0.3 - 1.2 mg/dL - - -  Alkaline Phos 39 - 117 U/L - - -  AST 0 - 37 U/L - - -  ALT 0 - 53 U/L - - -   CBC Latest Ref Rng & Units 08/07/2017 08/06/2017 08/06/2017  WBC 4.0 - 10.5 K/uL 10.2 - 11.6(H)  Hemoglobin 13.0 - 17.0 g/dL 15.2 16.0 14.8  Hematocrit 39.0 - 52.0 % 46.4 47.0 45.2  Platelets 150 - 400 K/uL 220 - 214   CT head: IMPRESSION: Chronic atrophic and ischemic changes without acute abnormality.  X-ray left shoulder: IMPRESSION: Degenerative change without acute abnormality.  X-ray chest: IMPRESSION: Findings suggest low-grade pulmonary interstitial edema secondary to CHF. No alveolar pneumonia. Previous CABG.  Thoracic aortic atherosclerosis.  Discharge  Instructions: Discharge Instructions    Diet - low sodium heart healthy   Complete by:  As directed    Increase activity slowly   Complete by:  As directed       Signed: Kathi Ludwig, MD 08/08/2017, 11:28 AM   Pager: Pager# [phone]

## 2017-08-07 NOTE — Evaluation (Addendum)
Physical Therapy Evaluation Patient Details Name: Levi [name] MRN: [id number] DOB: [birth year] Today's Date: 08/07/2017   History of Present Illness  Pt is an 83 y/o male admitted secondary to syncopal episode. Pt reports hitting his head and falling on L shoulder, however, imaging of L shoulder, and CT of head negative for acute abnormality. PMH includes CHF, asthma, osteopenia, HTN, a fib, CAD s/p CABG, prostate cancer, ACDF, and s/p pacemaker placement.   Clinical Impression  Pt presenting with problems above and deficits below. PTA, pt was independent with functional mobility. Upon eval, pt presenting with weakness, L shoulder soreness, and decreased balance. Required min guard for mobility. Educated about use of AD to increase safety at home with ambulation. Reports children will be staying with him initially upon d/c and has all necessary DME. Recommending HHPT at d/c to increase independence and safety with mobility. Will continue to follow acutely to ensure safety with mobility.     Follow Up Recommendations Home health PT;Supervision for mobility/OOB    Equipment Recommendations  None recommended by PT    Recommendations for Other Services       Precautions / Restrictions Precautions Precautions: Fall Restrictions Weight Bearing Restrictions: No      Mobility  Bed Mobility               General bed mobility comments: Standing in room with RN upon entry.   Transfers Overall transfer level: Needs assistance Equipment used: None Transfers: Sit to/from Stand Sit to Stand: Min guard         General transfer comment: Min guard for safety.   Ambulation/Gait Ambulation/Gait assistance: Min guard Ambulation Distance (Feet): 150 Feet Assistive device: None Gait Pattern/deviations: Step-through pattern;Decreased stride length;Wide base of support Gait velocity: Decreased Gait velocity interpretation: Below normal speed for age/gender General Gait Details: Slow,  unsteady gait, however, no LOB noted. Reports only being able to perform short distances without having to take a break at baseline. Educated about use of walker at home to increase stability and pt agreeable.   Stairs Stairs: (Educated about safe stair management at home using rail)          Wheelchair Mobility    Modified Rankin (Stroke Patients Only)       Balance Overall balance assessment: Needs assistance Sitting-balance support: No upper extremity supported;Feet supported Sitting balance-Leahy Scale: Good     Standing balance support: No upper extremity supported;During functional activity Standing balance-Leahy Scale: Fair                               Pertinent Vitals/Pain Pain Assessment: 0-10 Pain Score: 2  Pain Location: shoulder  Pain Descriptors / Indicators: Aching;Operative site guarding Pain Intervention(s): Limited activity within patient's tolerance;Monitored during session;Repositioned    Home Living Family/patient expects to be discharged to:: Private residence Living Arrangements: Alone Available Help at Discharge: Family;Available 24 hours/day Type of Home: House Home Access: Stairs to enter Entrance Stairs-Rails: Right Entrance Stairs-Number of Steps: 2 Home Layout: One level Home Equipment: Clinical cytogeneticist - 2 wheels;Walker - 4 wheels;Cane - single point;Toilet riser Additional Comments: Reports children can stay overnight with pt.     Prior Function Level of Independence: Independent               Hand Dominance   Dominant Hand: Right    Extremity/Trunk Assessment   Upper Extremity Assessment Upper Extremity Assessment: LUE deficits/detail LUE Deficits / Details: Soreness in  shoulder limited ROM.     Lower Extremity Assessment Lower Extremity Assessment: Generalized weakness    Cervical / Trunk Assessment Cervical / Trunk Assessment: Normal  Communication   Communication: No difficulties  Cognition  Arousal/Alertness: Awake/alert Behavior During Therapy: WFL for tasks assessed/performed Overall Cognitive Status: Within Functional Limits for tasks assessed                                        General Comments General comments (skin integrity, edema, etc.): Pt's son present in room. Educated about HHPT recommendations and pt agreeable. Pt's son reports he will be staying with him overnight.     Exercises Other Exercises Other Exercises: Educated about importance of shoulder ROM in decreasing pain in L shoulder.    Assessment/Plan    PT Assessment Patient needs continued PT services  PT Problem List Decreased strength;Decreased balance;Decreased mobility;Decreased knowledge of use of DME;Pain       PT Treatment Interventions DME instruction;Gait training;Stair training;Functional mobility training;Therapeutic activities;Therapeutic exercise;Balance training;Neuromuscular re-education;Patient/family education    PT Goals (Current goals can be found in the Care Plan section)  Acute Rehab PT Goals Patient Stated Goal: to go home  PT Goal Formulation: With patient Time For Goal Achievement: 08/14/17 Potential to Achieve Goals: Good    Frequency Min 3X/week   Barriers to discharge        Co-evaluation               AM-PAC PT "6 Clicks" Daily Activity  Outcome Measure Difficulty turning over in bed (including adjusting bedclothes, sheets and blankets)?: A Little Difficulty moving from lying on back to sitting on the side of the bed? : A Little Difficulty sitting down on and standing up from a chair with arms (e.g., wheelchair, bedside commode, etc,.)?: Unable Help needed moving to and from a bed to chair (including a wheelchair)?: A Little Help needed walking in hospital room?: A Little Help needed climbing 3-5 steps with a railing? : A Little 6 Click Score: 16    End of Session Equipment Utilized During Treatment: Gait belt Activity Tolerance:  Patient tolerated treatment well Patient left: in chair;with call bell/phone within reach;with family/visitor present Nurse Communication: Mobility status PT Visit Diagnosis: Unsteadiness on feet (R26.81);Muscle weakness (generalized) (M62.81);Pain Pain - Right/Left: Left Pain - part of body: Shoulder    Time: 0623-7628 PT Time Calculation (min) (ACUTE ONLY): 20 min   Charges:   PT Evaluation $PT Eval Low Complexity: 1 Low     PT G Codes:   PT G-Codes **NOT FOR INPATIENT CLASS** Functional Assessment Tool Used: AM-PAC 6 Clicks Basic Mobility;Clinical judgement Functional Limitation: Mobility: Walking and moving around Mobility: Walking and Moving Around Current Status (B1517): At least 40 percent but less than 60 percent impaired, limited or restricted Mobility: Walking and Moving Around Goal Status [phone]): At least 1 percent but less than 20 percent impaired, limited or restricted    Leighton Ruff, PT, DPT  Acute Rehabilitation Services  Pager: [phone]   Rudean Hitt 08/07/2017, 12:03 PM

## 2017-08-07 NOTE — Care Management Note (Signed)
Case Management Note  Patient Details  Name: Levi [name] MRN: [id number] Date of Birth: Jan 04, 1929  Subjective/Objective:  Syncope                 Action/Plan: Patient lives at home alone, his family lives close by; PCP: Bernerd Limbo, MD; has private insurance with [employer] with prescription drug coverage; he use mail order pharmacy and [facility]; no problem with medication; DME - cane and walker at home - he use them as needed; he continues to drive and remains active; Noted Physical Therapy recommendation for HHPT, patient refused this, stated " I don't need that." No needs identified at this time. Son at the bedside. CM talked to Attending MD, plans to discharge home today.  Expected Discharge Date:   08/07/2017               Expected Discharge Plan:  Home/Self Care  Discharge planning Services  CM Consult    HH Arranged:  Patient Refused [facility]    Status of Service:  In process, will continue to follow  Sherrilyn Rist [phone] 08/07/2017, 2:02 PM

## 2017-08-11 ENCOUNTER — Telehealth: Payer: Self-pay | Admitting: Internal Medicine

## 2017-08-11 NOTE — Telephone Encounter (Signed)
I have spoken to [place] - the daugher.  Will hold losartan tonight and tomorrow.  Monitor BP in the interim.  Call on Wednesday with update.   Burtis Junes, RN, [facility] [address] [place], [place]  [address] [phone]

## 2017-08-11 NOTE — Telephone Encounter (Signed)
Called patient to notify him of the office closure on 08/12/17 due to inclement weather. Patient gave his phone to his daughter, Levi [name].  Levi [name] has questions about modifying his BP medication because he was feeling "swimmy headed" today. Stated he has felt this way intermittently.  Patient passed out last week. Family called 911. ED took him off 1 BP medication but left him on 2 meds. We are having to cancel tomorrow's f/u appt with Levi [name] due to inclement weather. Patient sees Levi [name].  Here are today's readings:  530a - 139/75 Pulse 97  700a - took medication  Lunch time - 94/55 Pulse 74 ( Patient felt bad so he ate some soup right before he took his BP and sat down. Felt swimmy headed.)  1:30 110/57 Pulse wasn't taken  1:45 128/53 Pulse 73 - patient feels better  Patient has a head cold but hasn't taken any OTC meds other than nose spray.  Levi [name] is wondering if they should modify any of his medications to avoid this intermittent light headedness.  Our next available appointment with anyone on Levi [name] care team is 08/21/17 with [facility]. I've place the patient on the wait list and will work the scheduling team once our office reopens to get him a sooner appointment.  Routing to our on call provider, Levi Merle, PA-C, for advice.

## 2017-08-12 ENCOUNTER — Ambulatory Visit: Payer: Medicare Other | Admitting: Nurse Practitioner

## 2017-08-14 ENCOUNTER — Encounter: Payer: Self-pay | Admitting: Physician Assistant

## 2017-08-14 ENCOUNTER — Ambulatory Visit (INDEPENDENT_AMBULATORY_CARE_PROVIDER_SITE_OTHER): Payer: Medicare Other | Admitting: Physician Assistant

## 2017-08-14 ENCOUNTER — Ambulatory Visit (INDEPENDENT_AMBULATORY_CARE_PROVIDER_SITE_OTHER): Payer: Medicare Other

## 2017-08-14 VITALS — Ht 70.0 in | Wt 268.1 lb

## 2017-08-14 DIAGNOSIS — I480 Paroxysmal atrial fibrillation: Secondary | ICD-10-CM

## 2017-08-14 DIAGNOSIS — Z951 Presence of aortocoronary bypass graft: Secondary | ICD-10-CM

## 2017-08-14 DIAGNOSIS — I951 Orthostatic hypotension: Secondary | ICD-10-CM | POA: Diagnosis not present

## 2017-08-14 DIAGNOSIS — Z9861 Coronary angioplasty status: Secondary | ICD-10-CM

## 2017-08-14 DIAGNOSIS — I4891 Unspecified atrial fibrillation: Secondary | ICD-10-CM

## 2017-08-14 DIAGNOSIS — R55 Syncope and collapse: Secondary | ICD-10-CM

## 2017-08-14 DIAGNOSIS — I2510 Atherosclerotic heart disease of native coronary artery without angina pectoris: Secondary | ICD-10-CM

## 2017-08-14 DIAGNOSIS — I5030 Unspecified diastolic (congestive) heart failure: Secondary | ICD-10-CM

## 2017-08-14 LAB — POCT INR: INR: 2.1

## 2017-08-14 MED ORDER — CARVEDILOL 6.25 MG PO TABS: 6.2500 mg | ORAL_TABLET | Freq: Two times a day (BID) | ORAL | 3 refills | Status: DC

## 2017-08-14 NOTE — Progress Notes (Signed)
Cardiology Office Note    Date:  08/14/2017   ID:  Levi [name], DOB Mar 20, 1929, MRN [id number]  PCP:  Levi Limbo, MD  Cardiologist: Dr. Harrington [name] EPS Dr. Lovena [name]  No chief complaint on file.   History of Present Illness:  Levi [name] is a 83 y.o. male with history of CAD status post CABG in 2011, DES to the LAD in 2014, PAF chadsvASC equals 4, permanent pacemaker Levi [name], was feeling bad when he saw Dr. Lovena [name] 10/2016 who reviewed his case with Dr. Burt [name] recommend left heart cath to redefine anatomy but the patient did not want to pursue it at the time since his wife was very ill.  His wife then passed away.  Went to see Dr. Maylon [name] at [facility] for second opinion and Lasix was increased and scheduled for nuclear stress test and PFTs.  And underwent cardiac catheterization 01/2017 [facility] with results below.   Multi-vessel coronary artery disease.  Maximize medical therapy.  Discussed with patient (but may still be drowsy).  Discussed with family:  Coronary angiography reveals 3-vessel CAD. LM is diffusely diseased with 50% distal stenosis. LAD has a 80% mid instent restenosis, LCX has serial 50-60% stenoses in the mid segment, OM2 is a small vessel and has a 70% ostial stenosis. RCA has long diffuse 50-60% proximal and 60% distal stenosis. LIMA to LAD is patent with a kink in the mid segment. Flow is normal. LV-Gram reveals EF 50%. LVEDP is 8 mmHg.   Left radial artery was very small and a 39F catheter could not be advanced. Right femoral artery access was obtained for the procedure. Femoral arteriotomy was closed with 76F Angioseal device.   There is no procedure complication. Loss of blood is <5 ml. No tissue sample is removed.    SVG grafts are known to be chronically occluded, so not injected   Patient went to the ER 08/07/17 with syncope.  He said he was making breakfast when he hit the floor.  When he got to the emergency room he had SVT and atrial flutter.  Pacer rep from  [place] checked his pacer and said he had atrial fibrillation prior to his syncope.  He also had orthostatic hypotension.  His amlodipine and Spironolactone were stopped.  He was still dizzy when he got home and blood pressures were running low so losartan was stopped.  Patient comes in today accompanied by his daughter and son.  He brings a list of blood pressures from home that run from 03-50 systolic.  He continues to be dizzy.  He is not on any rate lowering medications.  He has not had any problems with heart failure recently.  He does have chronic lower extremity edema.  He has lost 20 pounds since his cath at [facility].  This may be contributing to his lower blood pressures.  His electrolytes and hemoglobin were stable in the hospital.  His blood pressures here are much different than home but he still has a 27 mmHg drop.  Patient denies chest pain but has chronic dyspnea on exertion.    Past Medical History:  Diagnosis Date  . Asthma   . Benign neoplasm of colon   . CAD (coronary artery disease) Dec 2011   s/p CABG per Levi [name]; had normal EF; All SVGs occluded per follow up cath with only LIMA to LAD patent; s/p PCI of the proximal and mid LAD February 2014 per Dr. Burt [name]  . Dyspnea on exertion   .  Edema   . Emphysema    "never treated for it" (10/05/2015)  . History of hiatal hernia   . HLD (hyperlipidemia)   . HTN (hypertension)   . Malaise and fatigue   . Obesity   . Osteopenia   . Pneumonia 1960s X 1; 1990s X 1  . Presence of permanent cardiac pacemaker   . Prostate cancer ([place])   . Skin cancer    "face, nose, top of ears, scalp"  . Symptomatic bradycardia    STJ PPM, 10/05/15, Dr. Lovena [name]    Past Surgical History:  Procedure Laterality Date  . ANTERIOR CERVICAL DECOMP/DISCECTOMY FUSION    . BACK SURGERY    . CATARACT EXTRACTION Right   . CORONARY ANGIOPLASTY WITH STENT PLACEMENT  10/14/12   with stent to proximal and mid LAD  . CORONARY ARTERY  BYPASS GRAFT  08/23/10   "CABG X4"  . EP IMPLANTABLE DEVICE N/A 10/05/2015   Procedure: Pacemaker Implant;  Surgeon: Levi Lance, MD;  Location: [place] CV LAB;  Service: Cardiovascular;  Laterality: N/A;  . EP IMPLANTABLE DEVICE N/A 10/06/2015   Procedure: Lead Revision/Repair;  Surgeon: Levi Meredith Leeds, MD;  Location: [place] CV LAB;  Service: Cardiovascular;  Laterality: N/A;  . INSERT / REPLACE / REMOVE PACEMAKER  10/05/2015  . LEFT HEART CATHETERIZATION WITH CORONARY/GRAFT ANGIOGRAM N/A 09/01/2012   Procedure: LEFT HEART CATHETERIZATION WITH Levi [name];  Surgeon: Levi Dresser, MD;  Location: [facility] CATH LAB;  Service: Cardiovascular;  Laterality: N/A;  . MEDIAN STERNOTOMY  08/23/10  . MOHS SURGERY  "@ least twice"  . PACEMAKER INSERTION     STJ 10/05/15  . PERCUTANEOUS CORONARY STENT INTERVENTION (PCI-S) N/A 10/14/2012   Procedure: PERCUTANEOUS CORONARY STENT INTERVENTION (PCI-S);  Surgeon: Levi Mocha, MD;  Location: [facility] CATH LAB;  Service: Cardiovascular;  Laterality: N/A;  . PROSTATE BIOPSY    . TRANSURETHRAL RESECTION OF PROSTATE      Current Medications: Current Meds  Medication Sig  . aspirin 81 MG tablet Take 1 tablet (81 mg total) by mouth at bedtime.  . nitroGLYCERIN (NITROSTAT) 0.4 MG SL tablet Place 0.4 mg under the tongue every 5 (five) minutes x 3 doses as needed for chest pain.  . Polyethylene Glycol 3350 POWD polyethylene glycol 3350 17 gram/dose oral powder  . polyethylene glycol powder (GLYCOLAX/MIRALAX) powder Take 1 Container by mouth daily.   . pravastatin (PRAVACHOL) 20 MG tablet TAKE 1 TABLET BY MOUTH  DAILY  . tamsulosin (FLOMAX) 0.4 MG CAPS capsule   . warfarin (COUMADIN) 5 MG tablet TAKE 1 TABLET BY MOUTH  DAILY  . [DISCONTINUED] furosemide (LASIX) 40 MG tablet Take 1 tablet (40 mg total) by mouth daily.  . [DISCONTINUED] isosorbide mononitrate (IMDUR) 60 MG 24 hr tablet TAKE ONE-HALF TABLET BY  MOUTH DAILY     Allergies:   Patient  has no known allergies.   Social History   Socioeconomic History  . Marital status: Widowed    Spouse name: None  . Number of children: None  . Years of education: None  . Highest education level: None  Social Needs  . Financial resource strain: None  . Food insecurity - worry: None  . Food insecurity - inability: None  . Transportation needs - medical: None  . Transportation needs - non-medical: None  Occupational History  . Occupation: retired Information systems manager: RETIRED  Tobacco Use  . Smoking status: Former Smoker    Packs/day: 1.00    Years: 30.00  Pack years: 30.00    Types: Cigarettes    Last attempt to quit: 09/07/1975    Years since quitting: 41.9  . Smokeless tobacco: Former Systems developer    Types: [place] date: 07/27/2015  Substance and Sexual Activity  . Alcohol use: No    Alcohol/week: 0.0 oz  . Drug use: No  . Sexual activity: Not Currently  Other Topics Concern  . None  Social History Narrative  . None     Family History:  The patient's family history includes Heart attack in his father; Heart disease in his brother; Hypertension in his mother; Kidney cancer in his father; Prostate cancer in his brother; Skin cancer in his brother; Stroke in his mother.   ROS:   Please see the history of present illness.    Review of Systems  Constitution: Positive for weakness and weight loss.  HENT: Negative.   Cardiovascular: Positive for dyspnea on exertion.  Respiratory: Negative.   Endocrine: Negative.   Hematologic/Lymphatic: Negative.   Musculoskeletal: Negative.   Gastrointestinal: Negative.   Genitourinary: Negative.   Neurological: Positive for dizziness.   All other systems reviewed and are negative.   PHYSICAL EXAM:   VS:  Ht 5\' 10"  (1.778 m)   Wt 268 lb 1.9 oz (121.6 kg)   SpO2 97%   BMI 38.47 kg/m   Physical Exam  GEN: Well nourished, well developed, in no acute distress  HEENT: normal  Neck: no JVD, carotid bruits, or  masses Cardiac:RRR; no murmurs, rubs, or gallops  Respiratory:  clear to auscultation bilaterally, normal work of breathing GI: soft, nontender, nondistended, + BS Ext: without cyanosis, clubbing, or edema, Good distal pulses bilaterally MS: no deformity or atrophy  Skin: warm and dry, no rash Neuro:  Alert and Oriented x 3, Strength and sensation are intact Psych: euthymic mood, full affect  Wt Readings from Last 3 Encounters:  08/14/17 268 lb 1.9 oz (121.6 kg)  08/07/17 262 lb 9.6 oz (119.1 kg)  01/15/17 279 lb 12.8 oz (126.9 kg)      Studies/Labs Reviewed:   EKG:  EKG is  ordered today.  The ekg ordered today demonstrates EKGs reviewed from the emergency room and show atrial flutter rate controlled.  I cannot find documentation of SVT as noted in the discharge summary.  Recent Labs: 01/15/2017: NT-Pro BNP 762 08/06/2017: B Natriuretic Peptide 143.1 08/07/2017: BUN 25; Creatinine, Ser 1.27; Hemoglobin 15.2; Platelets 220; Potassium 4.4; Sodium 139   Lipid Panel    Component Value Date/Time   CHOL 113 06/01/2014 0930   TRIG 115.0 06/01/2014 0930   HDL 32.40 (L) 06/01/2014 0930   CHOLHDL 3 06/01/2014 0930   VLDL 23.0 06/01/2014 0930   LDLCALC 58 06/01/2014 0930   LDLDIRECT 70.7 06/10/2013 1344    Additional studies/ records that were reviewed today include:  Catheterization 01/2017 [facility] Cardiac cath:  Conclusion     Multi-vessel coronary artery disease.  Maximize medical therapy.  Discussed with patient (but may still be drowsy).  Discussed with family:  Coronary angiography reveals 3-vessel CAD. LM is diffusely diseased with 50% distal stenosis. LAD has a 80% mid instent restenosis, LCX has serial 50-60% stenoses in the mid segment, OM2 is a small vessel and has a 70% ostial stenosis. RCA has long diffuse 50-60% proximal and 60% distal stenosis. LIMA to LAD is patent with a kink in the mid segment. Flow is normal. LV-Gram reveals EF 50%. LVEDP is 8  mmHg.  Left radial artery was very small and a 21F catheter could not be advanced. Right femoral artery access was obtained for the procedure. Femoral arteriotomy was closed with 37F Angioseal device.   There is no procedure complication. Loss of blood is <5 ml. No tissue sample is removed.    SVG grafts are known to be chronically occluded, so not injected        ASSESSMENT:    1. Syncope, unspecified syncope type   2. PAF (paroxysmal atrial fibrillation) ([place])   3. Orthostatic hypotension   4. Heart failure with preserved ejection fraction ([place])   5. H/O coronary artery bypass surgery      PLAN:  In order of  Problems listed above:  Syncope with reports of SVT and rapid atrial fibrillation although I cannot find documentation of this.  Also had orthostatic hypotension.  Blood pressures from home  confirmed by his daughter who is a nurse are running very low.  His orthostatic in the office today.  He has lost 20 pounds deliberately which may be lowering his blood pressures.  We Levi stop Imdur and change Lasix to as needed for weight gain of 2 or 3 pounds overnight.  Begin low-dose Coreg 6.25 mg twice daily.  Follow-up with Dr. Harrington [name] in 1 week.  PAF converted to normal sinus rhythm spontaneously in the ER.  He is on Coumadin and family is asking about changing to Eliquis which may be reasonable in this patient.  Discussed with Dr. Harrington [name].  Patient also has follow-up with Dr. Lovena [name] in February.  Orthostatic hypotension see above dictation stopping Imdur and changing Lasix to as needed.  History of diastolic CHF no recent problems with heart failure and watching salt closely.  20 pound weight loss which has helped.  Take Lasix as needed.  History of CAD status post prior CABG and PCI.  Went to [facility] and underwent cardiac catheterization 01/2017 with 3 vessel disease, LIMA to the LAD was patent with a kink in the mid segment EF 50%, see above for details..  Patient would like to keep  follow-up with Dr. Harrington [name].   Medication Adjustments/Labs and Tests Ordered: Current medicines are reviewed at length with the patient today.  Concerns regarding medicines are outlined above.  Medication changes, Labs and Tests ordered today are listed in the Patient Instructions below. Patient Instructions  Medication Instructions:  Your physician has recommended you make the following change in your medication:  1.  START Coreg 6.25 taking 1 tablet twice a day 2.  STOP the Lasix 3.  STOP the Imdur   Labwork: None ordered  Testing/Procedures: None ordered  Follow-Up: Your physician recommends that you schedule a follow-up appointment in: [place] DR. ROSS OR HER [facility] OR    Any Other Special Instructions Levi Be Listed Below (If Applicable).     If you need a refill on your cardiac medications before your next appointment, please call your pharmacy.      Sumner Boast, PA-C  08/14/2017 11:54 AM    [facility] [address], [place], [place]  [address] Phone: [phone]; Fax: [phone]

## 2017-08-14 NOTE — Patient Instructions (Addendum)
Medication Instructions:  Your physician has recommended you make the following change in your medication:  1.  START Coreg 6.25 taking 1 tablet twice a day 2.  STOP the Lasix 3.  STOP the Imdur   Labwork: None ordered  Testing/Procedures: None ordered  Follow-Up: Your physician recommends that you schedule a follow-up appointment in: [place] DR. ROSS OR HER [facility] OR    Any Other Special Instructions Will Be Listed Below (If Applicable).     If you need a refill on your cardiac medications before your next appointment, please call your pharmacy.

## 2017-08-14 NOTE — Patient Instructions (Addendum)
Continue same dosage 1 tablet everyday. Recheck INR in 1 week. [facility]in [facility] [phone]

## 2017-08-15 ENCOUNTER — Encounter: Payer: Self-pay | Admitting: *Deleted

## 2017-08-18 ENCOUNTER — Telehealth: Payer: Self-pay | Admitting: Internal Medicine

## 2017-08-18 DIAGNOSIS — Z961 Presence of intraocular lens: Secondary | ICD-10-CM | POA: Diagnosis not present

## 2017-08-18 DIAGNOSIS — H01022 Squamous blepharitis right lower eyelid: Secondary | ICD-10-CM | POA: Diagnosis not present

## 2017-08-18 DIAGNOSIS — H01021 Squamous blepharitis right upper eyelid: Secondary | ICD-10-CM | POA: Diagnosis not present

## 2017-08-18 DIAGNOSIS — H02834 Dermatochalasis of left upper eyelid: Secondary | ICD-10-CM | POA: Diagnosis not present

## 2017-08-18 DIAGNOSIS — H04123 Dry eye syndrome of bilateral lacrimal glands: Secondary | ICD-10-CM | POA: Diagnosis not present

## 2017-08-18 DIAGNOSIS — H01025 Squamous blepharitis left lower eyelid: Secondary | ICD-10-CM | POA: Diagnosis not present

## 2017-08-18 DIAGNOSIS — H02831 Dermatochalasis of right upper eyelid: Secondary | ICD-10-CM | POA: Diagnosis not present

## 2017-08-18 DIAGNOSIS — H01024 Squamous blepharitis left upper eyelid: Secondary | ICD-10-CM | POA: Diagnosis not present

## 2017-08-18 DIAGNOSIS — H02105 Unspecified ectropion of left lower eyelid: Secondary | ICD-10-CM | POA: Diagnosis not present

## 2017-08-18 NOTE — Telephone Encounter (Signed)
BUSY

## 2017-08-18 NOTE — Telephone Encounter (Signed)
[name] returned call. She states that patient has gained 12lbs since Saturday and is sob. Per OV note on 12/13 with Ermalinda Barrios, PA he should take lasix as needed for weight gain of 2-3lbs overnight. I informed her that patient should take lasix to see if symptoms improve, and monitor sodium intake. Patient is scheduled for a follow up appointment on 12/20. She verbalize understanding, in agreement with plan and thanked me for the call.

## 2017-08-18 NOTE — Telephone Encounter (Signed)
New message  [name] pt daughter verbalized that she is calling to giver her fathers PCP Dr.Buska new number [phone]   [facility] Address is [address] [place] [phone]

## 2017-08-18 NOTE — Telephone Encounter (Signed)
Agree with recommendations.  

## 2017-08-18 NOTE — Telephone Encounter (Signed)
Crystal is going to see about changing this information in our system.

## 2017-08-18 NOTE — Telephone Encounter (Signed)
I spoke with patient's daughter-pt has same PCP, Dr Coletta Memos, but location and address for PCP has changed-I am unable to change and update this is Epic.

## 2017-08-18 NOTE — Telephone Encounter (Signed)
°  New Prob  Has some questions regarding Lasix medication. Needs clarification on what dosage should be taking as he was taken off of the med but was told to go back on if he gained weight. Please call.

## 2017-08-18 NOTE — Telephone Encounter (Signed)
Left message to call back  

## 2017-08-21 ENCOUNTER — Ambulatory Visit: Payer: Medicare Other | Admitting: Physician Assistant

## 2017-08-21 ENCOUNTER — Ambulatory Visit (INDEPENDENT_AMBULATORY_CARE_PROVIDER_SITE_OTHER): Payer: Medicare Other | Admitting: Pharmacist

## 2017-08-21 ENCOUNTER — Encounter: Payer: Self-pay | Admitting: Physician Assistant

## 2017-08-21 ENCOUNTER — Ambulatory Visit (INDEPENDENT_AMBULATORY_CARE_PROVIDER_SITE_OTHER): Payer: Medicare Other | Admitting: Physician Assistant

## 2017-08-21 VITALS — BP 146/78 | HR 70 | Resp 16 | Ht 70.0 in | Wt 273.8 lb

## 2017-08-21 DIAGNOSIS — I10 Essential (primary) hypertension: Secondary | ICD-10-CM

## 2017-08-21 DIAGNOSIS — I951 Orthostatic hypotension: Secondary | ICD-10-CM | POA: Diagnosis not present

## 2017-08-21 DIAGNOSIS — I480 Paroxysmal atrial fibrillation: Secondary | ICD-10-CM

## 2017-08-21 DIAGNOSIS — R55 Syncope and collapse: Secondary | ICD-10-CM

## 2017-08-21 DIAGNOSIS — Z9861 Coronary angioplasty status: Secondary | ICD-10-CM

## 2017-08-21 DIAGNOSIS — I5033 Acute on chronic diastolic (congestive) heart failure: Secondary | ICD-10-CM

## 2017-08-21 DIAGNOSIS — I2510 Atherosclerotic heart disease of native coronary artery without angina pectoris: Secondary | ICD-10-CM

## 2017-08-21 DIAGNOSIS — I4891 Unspecified atrial fibrillation: Secondary | ICD-10-CM | POA: Diagnosis not present

## 2017-08-21 LAB — POCT INR: INR: 1.5

## 2017-08-21 NOTE — Patient Instructions (Addendum)
Medication Instructions:  Your physician recommends that you continue on your current medications as directed. Please refer to the Current Medication list given to you today.   Labwork: None ordered  Testing/Procedures: None ordered  Follow-Up: Your physician recommends that you schedule a follow-up appointment in: 3 MONTHS WITH DR. ROSS    Any Other Special Instructions Will Be Listed Below (If Applicable).     If you need a refill on your cardiac medications before your next appointment, please call your pharmacy.   

## 2017-08-21 NOTE — Patient Instructions (Signed)
Description   Take 1.5 tablets today and tomorrow, then continue same dosage 1 tablet every day. Recheck INR in 2 weeks. Coumadin Clinic [phone]

## 2017-08-21 NOTE — Progress Notes (Signed)
Cardiology Office Note    Date:  08/21/2017   ID:  Levi [name], DOB [birth year], MRN [id number]  PCP:  Bernerd Limbo, MD  Cardiologist: Dr. Harrington Challenger Electrophysiologist: Dr. Lovena Le  Chief Complaint: 1 week follow up for dizziness  History of Present Illness:   Levi [name] is a 83 y.o. male history of CAD status post CABG in 2011, DES to the LAD in 2014, PAF chadsvASC equals 4, permanent pacemaker [name], HTN, HLD and obesity presented for follow up.   The patient was feeling bad when saw Dr. Lovena Le 10/2016 who reviewed his case with Dr. Burt Knack recommend left heart cath to redefine anatomy but the patient did not want to pursue it at the time since his wife was very ill.  His wife then passed away.  Went to see Dr. Maylon Peppers at [facility] for second opinion and Lasix was increased and scheduled for nuclear stress test and PFTs.  And underwent cardiac catheterization 01/2017 [facility] with results below.   Multi-vessel coronary artery disease.  Maximize medical therapy.  Discussed with patient (but may still be drowsy).  Discussed with family:  Coronary angiography reveals 3-vessel CAD. LM is diffusely diseased with 50% distal stenosis. LAD has a 80% mid instent restenosis, LCX has serial 50-60% stenoses in the mid segment, OM2 is a small vessel and has a 70% ostial stenosis. RCA has long diffuse 50-60% proximal and 60% distal stenosis. LIMA to LAD is patent with a kink in the mid segment. Flow is normal. LV-Gram reveals EF 50%. LVEDP is 8 mmHg.   Left radial artery was very small and a 543F catheter could not be advanced. Right femoral artery access was obtained for the procedure. Femoral arteriotomy was closed with 43F Angioseal device.   There is no procedure complication. Loss of blood is <5 ml. No tissue sample is removed.   SVG grafts are known to be chronically occluded, so not injected   Patient went to the ER 08/07/17 with syncope.  He said he was making breakfast when he hit the  floor.  When he got to the emergency room he had SVT and atrial flutter.  Pacer rep from [facility] checked his pacer and said he had atrial fibrillation prior to his syncope.  He also had orthostatic hypotension.  His amlodipine and Spironolactone were stopped.  He was still dizzy when he got home and blood pressures were running low so losartan was stopped. Converted to normal sinus rhythm spontaneously in the ER.   Seen by APP 08/14/17 for persistent hypotension and dizziness. He has lost 20 pounds since his cath at [facility]. He was orthostatic during office visit. Stopped Imdur and reduced lasix to PRN. Started coreg 6.25mg  BID.   Here today for follow up with son.  Dizziness has been resolved.  His systolic blood pressure mostly stays in 130-140s.  2 episodes of blood pressure running in 110.  He is feeling much better.  Energy improving.  He denies chest pain, shortness of breath, orthopnea, PND, syncope.  He has gained 12 pounds in 3 days after discontinuation of Lasix during last office visit.  The patient states that he restarted his Lasix 40 mg daily 3 days ago and weight has been trending down.  No telephone encounter form.  Past Medical History:  Diagnosis Date  . Asthma   . Benign neoplasm of colon   . CAD (coronary artery disease) Dec 2011   s/p CABG per Dr. Roxy Manns; had normal  EF; All SVGs occluded per follow up cath with only LIMA to LAD patent; s/p PCI of the proximal and mid LAD February 2014 per Dr. Burt Knack  . Dyspnea on exertion   . Edema   . Emphysema    "never treated for it" (10/05/2015)  . History of hiatal hernia   . HLD (hyperlipidemia)   . HTN (hypertension)   . Malaise and fatigue   . Obesity   . Osteopenia   . Pneumonia 1960s X 1; 1990s X 1  . Presence of permanent cardiac pacemaker   . Prostate cancer ([place])   . Skin cancer    "face, nose, top of ears, scalp"  . Symptomatic bradycardia    STJ PPM, 10/05/15, Dr. Lovena Le    Past Surgical History:    Procedure Laterality Date  . ANTERIOR CERVICAL DECOMP/DISCECTOMY FUSION    . BACK SURGERY    . CATARACT EXTRACTION Right   . CORONARY ANGIOPLASTY WITH STENT PLACEMENT  10/14/12   with stent to proximal and mid LAD  . CORONARY ARTERY BYPASS GRAFT  08/23/10   "CABG X4"  . EP IMPLANTABLE DEVICE N/A 10/05/2015   Procedure: Pacemaker Implant;  Surgeon: [name], MD;  Location: [place] [facility];  Service: Cardiovascular;  Laterality: N/A;  . EP IMPLANTABLE DEVICE N/A 10/06/2015   Procedure: Lead Revision/Repair;  Surgeon: [name], MD;  Location: [place] [facility];  Service: Cardiovascular;  Laterality: N/A;  . INSERT / REPLACE / REMOVE PACEMAKER  10/05/2015  . LEFT HEART CATHETERIZATION WITH CORONARY/GRAFT ANGIOGRAM N/A 09/01/2012   Procedure: LEFT HEART CATHETERIZATION WITH [name];  Surgeon: [name], MD;  Location: [facility] CATH LAB;  Service: Cardiovascular;  Laterality: N/A;  . MEDIAN STERNOTOMY  08/23/10  . MOHS SURGERY  "@ least twice"  . PACEMAKER INSERTION     STJ 10/05/15  . PERCUTANEOUS CORONARY STENT INTERVENTION (PCI-S) N/A 10/14/2012   Procedure: PERCUTANEOUS CORONARY STENT INTERVENTION (PCI-S);  Surgeon: [name], MD;  Location: [facility] CATH LAB;  Service: Cardiovascular;  Laterality: N/A;  . PROSTATE BIOPSY    . TRANSURETHRAL RESECTION OF PROSTATE      Current Medications: Prior to Admission medications   Medication Sig Start Date End Date Taking? Authorizing Provider  aspirin 81 MG tablet Take 1 tablet (81 mg total) by mouth at bedtime. 01/06/15   [name], MD  carvedilol (COREG) 6.25 MG tablet Take 1 tablet (6.25 mg total) by mouth 2 (two) times daily. 08/14/17   Imogene Burn, PA-C  nitroGLYCERIN (NITROSTAT) 0.4 MG SL tablet Place 0.4 mg under the tongue every 5 (five) minutes x 3 doses as needed for chest pain. 08/09/13   [name], MD  Polyethylene Glycol 3350 POWD polyethylene glycol 3350 17 gram/dose oral powder 04/25/17    [provider]  polyethylene glycol powder (GLYCOLAX/MIRALAX) powder Take 1 Container by mouth daily.  09/12/15   [provider]  pravastatin (PRAVACHOL) 20 MG tablet TAKE 1 TABLET BY MOUTH  DAILY 02/26/17   [name], MD  tamsulosin [facility]) 0.4 MG CAPS capsule  07/28/17   [provider]  warfarin (COUMADIN) 5 MG tablet TAKE 1 TABLET BY MOUTH  DAILY 06/19/17   [name], MD    Allergies:   Patient has no known allergies.   Social History   Socioeconomic History  . Marital status: Widowed    Spouse name: None  . Number of children: None  . Years of education: None  .  Highest education level: None  Social Needs  . Financial resource strain: None  . Food insecurity - worry: None  . Food insecurity - inability: None  . Transportation needs - medical: None  . Transportation needs - non-medical: None  Occupational History  . Occupation: retired Information systems manager: RETIRED  Tobacco Use  . Smoking status: Former Smoker    Packs/day: 1.00    Years: 30.00    Pack years: 30.00    Types: Cigarettes    Last attempt to quit: 09/07/1975    Years since quitting: 41.9  . Smokeless tobacco: Former Systems developer    Types: [place] date: 07/27/2015  Substance and Sexual Activity  . Alcohol use: No    Alcohol/week: 0.0 oz  . Drug use: No  . Sexual activity: Not Currently  Other Topics Concern  . None  Social History Narrative  . None     Family History:  The patient's family history includes Heart attack in his father; Heart disease in his brother; Hypertension in his mother; Kidney cancer in his father; Prostate cancer in his brother; Skin cancer in his brother; Stroke in his mother.   ROS:   Please see the history of present illness.    ROS All other systems reviewed and are negative.   PHYSICAL EXAM:   VS:  BP (!) 146/78   Pulse 70   Resp 16   Ht 5\' 10"  (1.778 m)   Wt 273 lb 12.8 oz (124.2 kg)   SpO2 97%   BMI 39.29 kg/m    GEN: Well  nourished, well developed, in no acute distress  HEENT: normal  Neck: no JVD, carotid bruits, or masses Cardiac: RRR; no murmurs, rubs, or gallops, bilateral lower extremity edema  Respiratory:  clear to auscultation bilaterally, normal work of breathing GI: soft, nontender, nondistended, + BS MS: no deformity or atrophy  Skin: warm and dry, no rash Neuro:  Alert and Oriented x 3, Strength and sensation are intact Psych: euthymic mood, full affect  Wt Readings from Last 3 Encounters:  08/21/17 273 lb 12.8 oz (124.2 kg)  08/14/17 268 lb 1.9 oz (121.6 kg)  08/07/17 262 lb 9.6 oz (119.1 kg)      Studies/Labs Reviewed:   EKG:  EKG is ordered today.  The ekg ordered today demonstrates a paced rhythm  ecent Labs: 01/15/2017: NT-Pro BNP 762 08/06/2017: B Natriuretic Peptide 143.1 08/07/2017: BUN 25; Creatinine, Ser 1.27; Hemoglobin 15.2; Platelets 220; Potassium 4.4; Sodium 139   Lipid Panel    Component Value Date/Time   CHOL 113 06/01/2014 0930   TRIG 115.0 06/01/2014 0930   HDL 32.40 (L) 06/01/2014 0930   CHOLHDL 3 06/01/2014 0930   VLDL 23.0 06/01/2014 0930   LDLCALC 58 06/01/2014 0930   LDLDIRECT 70.7 06/10/2013 1344    Additional studies/ records that were reviewed today include:   Echocardiogram: 06/2016 Study Conclusions  - Left ventricle: Abnormal septal motion Systolic function was   normal. The estimated ejection fraction was in the range of 50%   to 55%. Doppler parameters are consistent with abnormal left   ventricular relaxation (grade 1 diastolic dysfunction). - Mitral valve: Calcified annulus. Mildly thickened leaflets . - Left atrium: The atrium was moderately dilated. - Atrial septum: No defect or patent foramen ovale was identified.    ASSESSMENT & PLAN:    1. PAF -Atrial paced patient rhythm on EKG.  Continue Coreg and warfarin.  2. CAD s/p CABG - Last  cath at [facility] as above.  No angina.  Continue aspirin, statin and beta-blocker  3. Orthostatic  hypotension -His symptoms has been resolved.  However he is orthostatic by vitals during office visit.  His blood pressure came back to normal after 3 minutes 130/64.  Orthostatic VS for the past 24 hrs:  BP- Lying Pulse- Lying BP- Sitting Pulse- Sitting BP- Standing at 0 minutes Pulse- Standing at 0 minutes  08/21/17 1326 146/70 70 134/72 86 114/66 87  -He did not felt dizzy when his blood pressure appropriate in clinic.  We will continue Coreg at current dose.  Advised to get balance before movement.  4.  Acute on chronic diastolic heart failure -Weight trending down since resumption of Lasix 3 days ago.  He is still 7 pounds up compared to last office visit (286-->273lb).  Continue Lasix 40 mg daily.     Medication Adjustments/Labs and Tests Ordered: Current medicines are reviewed at length with the patient today.  Concerns regarding medicines are outlined above.  Medication changes, Labs and Tests ordered today are listed in the Patient Instructions below. Patient Instructions  Medication Instructions:  Your physician recommends that you continue on your current medications as directed. Please refer to the Current Medication list given to you today.   Labwork: None ordered  Testing/Procedures: None ordered  Follow-Up: Your physician recommends that you schedule a follow-up appointment in: 3 MONTHS WITH DR. ROSS   Any Other Special Instructions Will Be Listed Below (If Applicable).     If you need a refill on your cardiac medications before your next appointment, please call your pharmacy.      Jarrett Soho, [place]  08/21/2017 2:13 PM    [facility] [address], [place], [place]  [address] Phone: [phone]; Fax: [phone]

## 2017-09-05 ENCOUNTER — Ambulatory Visit (INDEPENDENT_AMBULATORY_CARE_PROVIDER_SITE_OTHER): Payer: Medicare Other | Admitting: *Deleted

## 2017-09-05 ENCOUNTER — Ambulatory Visit: Payer: Medicare Other | Admitting: Internal Medicine

## 2017-09-05 DIAGNOSIS — I4891 Unspecified atrial fibrillation: Secondary | ICD-10-CM | POA: Diagnosis not present

## 2017-09-05 DIAGNOSIS — Z5181 Encounter for therapeutic drug level monitoring: Secondary | ICD-10-CM

## 2017-09-05 DIAGNOSIS — I480 Paroxysmal atrial fibrillation: Secondary | ICD-10-CM

## 2017-09-05 LAB — POCT INR: INR: 2

## 2017-09-05 NOTE — Patient Instructions (Signed)
Description   Continue same dosage 1 tablet every day. Recheck INR in 3 weeks. [facility] [phone]

## 2017-09-25 ENCOUNTER — Encounter: Payer: Self-pay | Admitting: Cardiology

## 2017-09-25 LAB — CUP PACEART REMOTE DEVICE CHECK
Battery Remaining Percentage: 95.5 %
Battery Voltage: 2.99 V
Brady Statistic RV Percent Paced: 98 %
Date Time Interrogation Session: 20181205154315
Implantable Lead Implant Date: 20170202
Implantable Lead Location: 753859
Implantable Pulse Generator Implant Date: 20170202
Lead Channel Pacing Threshold Amplitude: 1.25 V
Lead Channel Sensing Intrinsic Amplitude: 2.8 mV
Lead Channel Setting Pacing Pulse Width: 0.4 ms
Lead Channel Setting Sensing Sensitivity: 2 mV
MDC IDC LEAD IMPLANT DT: 20170202
MDC IDC LEAD LOCATION: 753860
MDC IDC MSMT BATTERY REMAINING LONGEVITY: 105 mo
MDC IDC MSMT LEADCHNL RA IMPEDANCE VALUE: 400 Ohm
MDC IDC MSMT LEADCHNL RA PACING THRESHOLD PULSEWIDTH: 0.4 ms
MDC IDC MSMT LEADCHNL RV IMPEDANCE VALUE: 460 Ohm
MDC IDC MSMT LEADCHNL RV PACING THRESHOLD AMPLITUDE: 0.75 V
MDC IDC MSMT LEADCHNL RV PACING THRESHOLD PULSEWIDTH: 0.4 ms
MDC IDC MSMT LEADCHNL RV SENSING INTR AMPL: 12 mV
MDC IDC SET LEADCHNL RA PACING AMPLITUDE: 2.5 V
MDC IDC SET LEADCHNL RV PACING AMPLITUDE: 1 V
MDC IDC STAT BRADY AP VP PERCENT: 78 %
MDC IDC STAT BRADY AP VS PERCENT: 1 %
MDC IDC STAT BRADY AS VP PERCENT: 20 %
MDC IDC STAT BRADY AS VS PERCENT: 1 %
MDC IDC STAT BRADY RA PERCENT PACED: 62 %
Pulse Gen Model: 2240
Pulse Gen Serial Number: 7864186

## 2017-09-25 NOTE — Progress Notes (Signed)
Remote pacemaker transmission.   

## 2017-09-26 ENCOUNTER — Ambulatory Visit (INDEPENDENT_AMBULATORY_CARE_PROVIDER_SITE_OTHER): Payer: Medicare Other | Admitting: *Deleted

## 2017-09-26 DIAGNOSIS — I480 Paroxysmal atrial fibrillation: Secondary | ICD-10-CM | POA: Diagnosis not present

## 2017-09-26 DIAGNOSIS — I4891 Unspecified atrial fibrillation: Secondary | ICD-10-CM

## 2017-09-26 LAB — POCT INR: INR: 1.6

## 2017-09-26 NOTE — Patient Instructions (Signed)
Description   Today take 1.5 tablets then continue same dosage 1 tablet every day. Recheck INR in 3 weeks. Coumadin Clinic [phone]

## 2017-09-29 DIAGNOSIS — Z87891 Personal history of nicotine dependence: Secondary | ICD-10-CM | POA: Diagnosis not present

## 2017-09-29 DIAGNOSIS — H02413 Mechanical ptosis of bilateral eyelids: Secondary | ICD-10-CM | POA: Diagnosis not present

## 2017-09-29 DIAGNOSIS — H9190 Unspecified hearing loss, unspecified ear: Secondary | ICD-10-CM | POA: Diagnosis not present

## 2017-09-29 DIAGNOSIS — G4730 Sleep apnea, unspecified: Secondary | ICD-10-CM | POA: Diagnosis not present

## 2017-09-29 DIAGNOSIS — Z955 Presence of coronary angioplasty implant and graft: Secondary | ICD-10-CM | POA: Diagnosis not present

## 2017-09-29 DIAGNOSIS — Z8679 Personal history of other diseases of the circulatory system: Secondary | ICD-10-CM | POA: Diagnosis not present

## 2017-09-29 DIAGNOSIS — Z09 Encounter for follow-up examination after completed treatment for conditions other than malignant neoplasm: Secondary | ICD-10-CM | POA: Diagnosis not present

## 2017-09-29 DIAGNOSIS — H02834 Dermatochalasis of left upper eyelid: Secondary | ICD-10-CM | POA: Diagnosis not present

## 2017-09-29 DIAGNOSIS — J309 Allergic rhinitis, unspecified: Secondary | ICD-10-CM | POA: Diagnosis not present

## 2017-09-29 DIAGNOSIS — E669 Obesity, unspecified: Secondary | ICD-10-CM | POA: Diagnosis not present

## 2017-09-29 DIAGNOSIS — Z6839 Body mass index (BMI) 39.0-39.9, adult: Secondary | ICD-10-CM | POA: Diagnosis not present

## 2017-09-29 DIAGNOSIS — I2510 Atherosclerotic heart disease of native coronary artery without angina pectoris: Secondary | ICD-10-CM | POA: Diagnosis not present

## 2017-09-29 DIAGNOSIS — G478 Other sleep disorders: Secondary | ICD-10-CM | POA: Diagnosis not present

## 2017-09-29 DIAGNOSIS — H53481 Generalized contraction of visual field, right eye: Secondary | ICD-10-CM | POA: Diagnosis not present

## 2017-09-29 DIAGNOSIS — F329 Major depressive disorder, single episode, unspecified: Secondary | ICD-10-CM | POA: Diagnosis not present

## 2017-09-29 DIAGNOSIS — H02831 Dermatochalasis of right upper eyelid: Secondary | ICD-10-CM | POA: Diagnosis not present

## 2017-09-29 DIAGNOSIS — J3089 Other allergic rhinitis: Secondary | ICD-10-CM | POA: Diagnosis not present

## 2017-10-17 ENCOUNTER — Ambulatory Visit (INDEPENDENT_AMBULATORY_CARE_PROVIDER_SITE_OTHER): Payer: Medicare Other | Admitting: Pharmacist

## 2017-10-17 DIAGNOSIS — I4891 Unspecified atrial fibrillation: Secondary | ICD-10-CM

## 2017-10-17 DIAGNOSIS — I480 Paroxysmal atrial fibrillation: Secondary | ICD-10-CM

## 2017-10-17 LAB — POCT INR: INR: 1.9

## 2017-10-17 NOTE — Patient Instructions (Signed)
Description   Today take 1.5 tablets then start taking 1 tablet every day except 1.5 tablets on Wednesdays. Recheck INR in 3 weeks. Coumadin Clinic [phone]

## 2017-10-23 ENCOUNTER — Encounter: Payer: Medicare Other | Admitting: Internal Medicine

## 2017-10-28 IMAGING — DX DG CHEST 2V
2 series · 2 of 2 positions shown · non-contrast
Comparison: 08/14/2015

CLINICAL DATA: Left-sided chest pain, shortness of breath

EXAM:
CHEST  2 VIEW

[w chest pa]
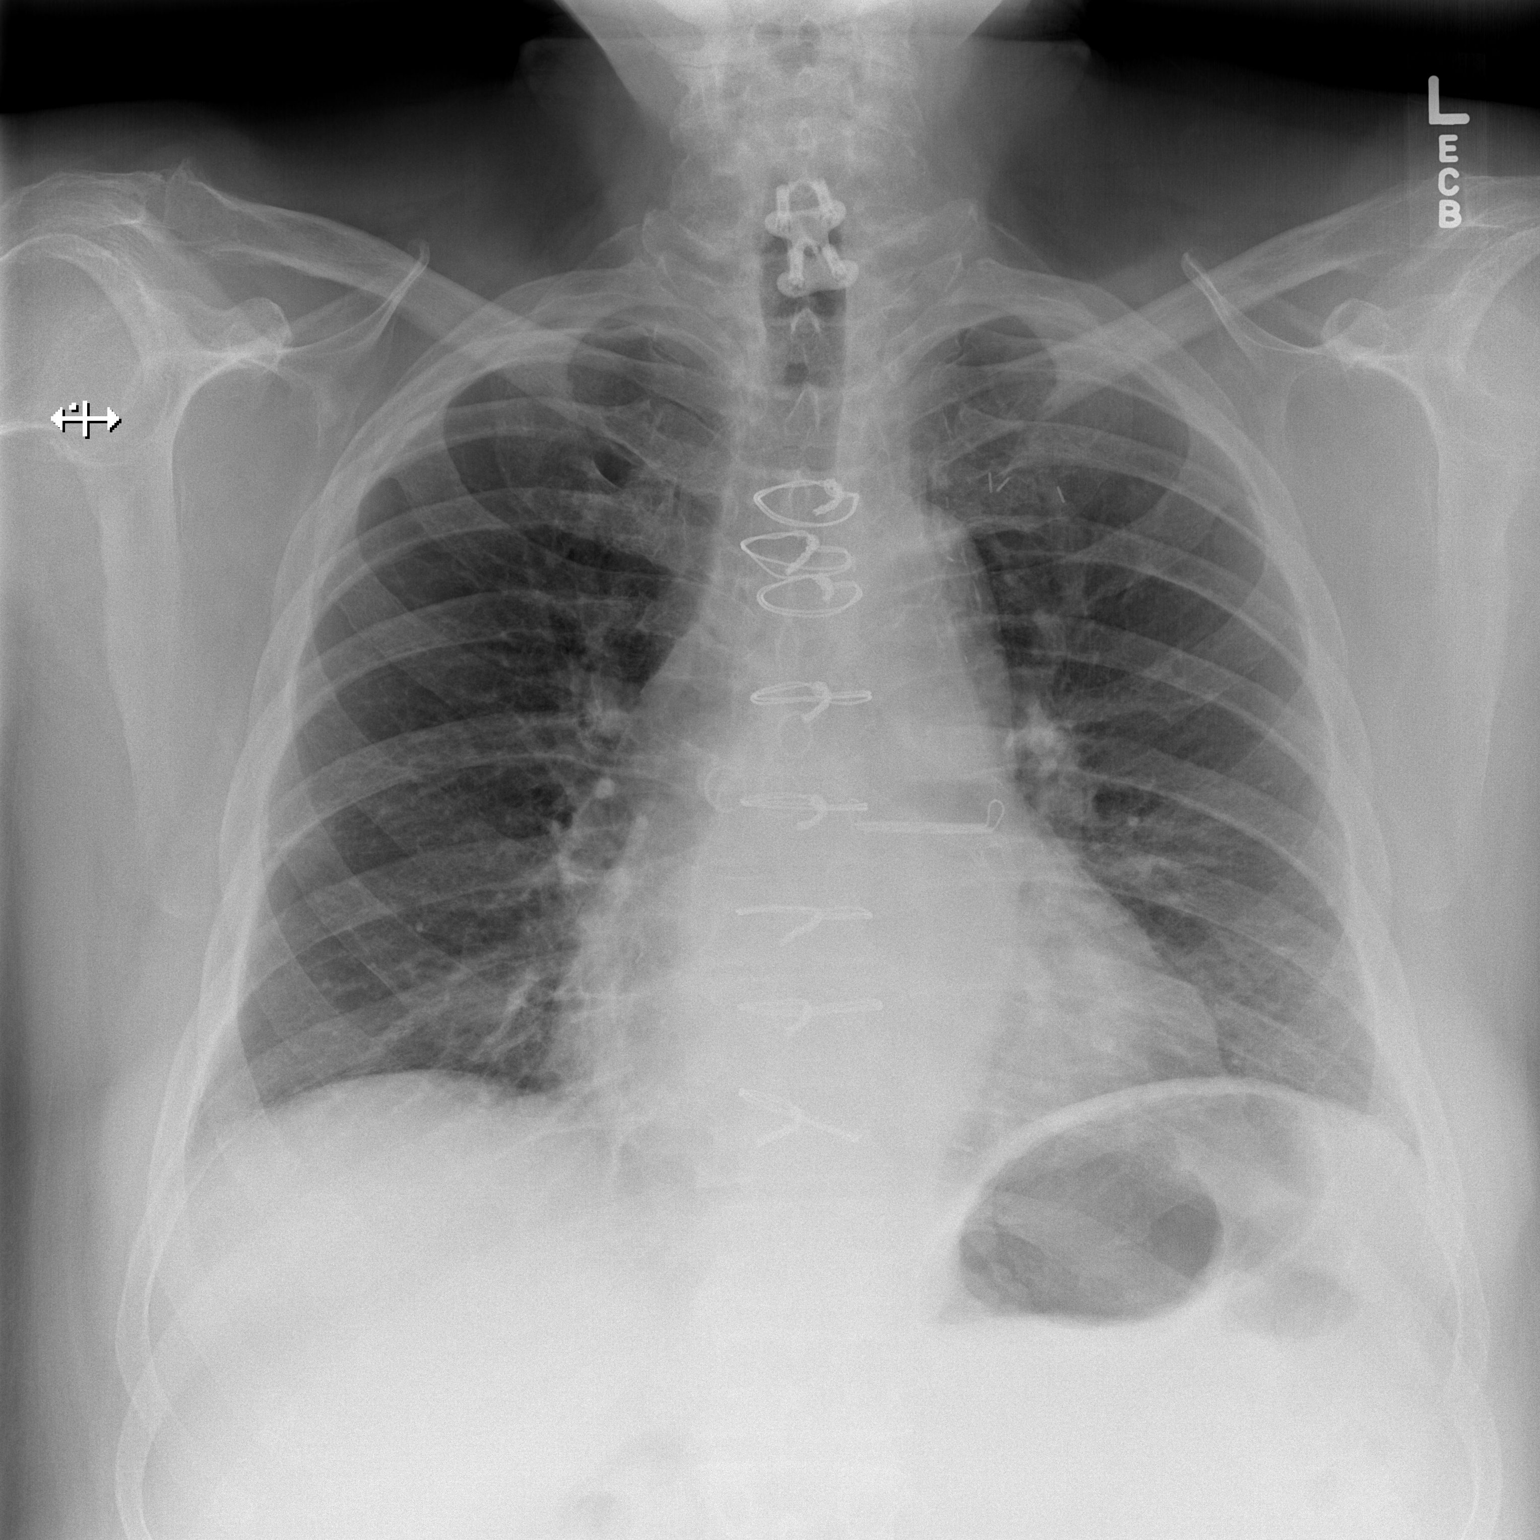

[w chest lat]
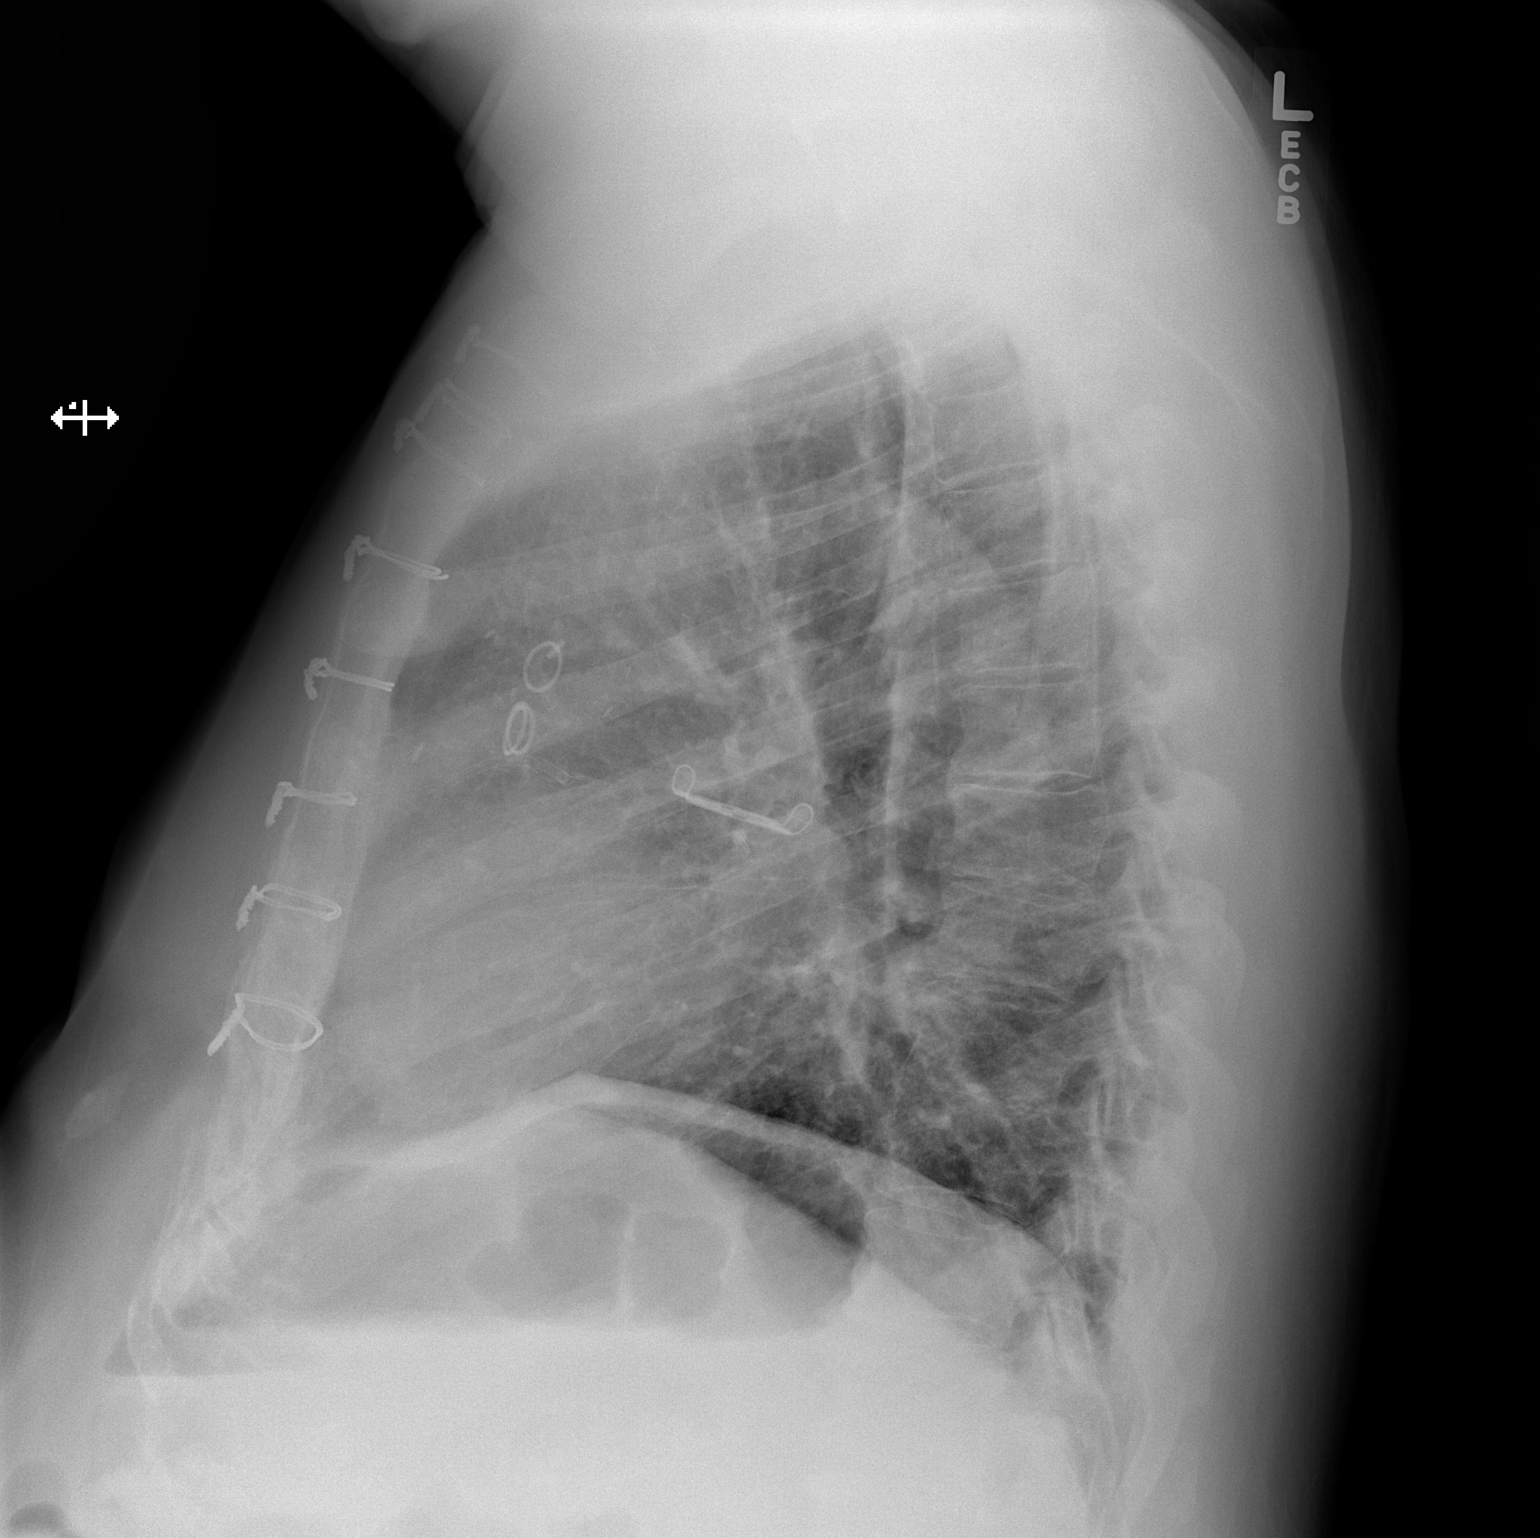

[2 of 2 positions shown; findings below may reference images not displayed]

FINDINGS: Lungs are clear.  No pleural effusion or pneumothorax.

The heart is top-normal in size/ mildly enlarged. Postsurgical
changes related to prior CABG.

Mild degenerative changes of the visualized thoracolumbar spine.
Cervical spine fixation hardware.
IMPRESSION: No evidence of acute cardiopulmonary disease.

## 2017-10-31 ENCOUNTER — Ambulatory Visit (INDEPENDENT_AMBULATORY_CARE_PROVIDER_SITE_OTHER): Payer: Medicare Other | Admitting: *Deleted

## 2017-10-31 ENCOUNTER — Ambulatory Visit: Payer: Medicare Other | Admitting: Internal Medicine

## 2017-10-31 ENCOUNTER — Encounter: Payer: Self-pay | Admitting: Internal Medicine

## 2017-10-31 ENCOUNTER — Ambulatory Visit (INDEPENDENT_AMBULATORY_CARE_PROVIDER_SITE_OTHER): Payer: Medicare Other | Admitting: Internal Medicine

## 2017-10-31 VITALS — BP 138/88 | HR 77 | Ht 70.0 in | Wt 289.0 lb

## 2017-10-31 VITALS — BP 138/88 | HR 77 | Ht 70.0 in | Wt 289.8 lb

## 2017-10-31 DIAGNOSIS — I480 Paroxysmal atrial fibrillation: Secondary | ICD-10-CM | POA: Diagnosis not present

## 2017-10-31 DIAGNOSIS — R55 Syncope and collapse: Secondary | ICD-10-CM | POA: Diagnosis not present

## 2017-10-31 DIAGNOSIS — Z950 Presence of cardiac pacemaker: Secondary | ICD-10-CM | POA: Diagnosis not present

## 2017-10-31 DIAGNOSIS — I509 Heart failure, unspecified: Secondary | ICD-10-CM | POA: Diagnosis not present

## 2017-10-31 DIAGNOSIS — I25810 Atherosclerosis of coronary artery bypass graft(s) without angina pectoris: Secondary | ICD-10-CM

## 2017-10-31 DIAGNOSIS — Z5181 Encounter for therapeutic drug level monitoring: Secondary | ICD-10-CM | POA: Diagnosis not present

## 2017-10-31 DIAGNOSIS — E782 Mixed hyperlipidemia: Secondary | ICD-10-CM | POA: Diagnosis not present

## 2017-10-31 DIAGNOSIS — R609 Edema, unspecified: Secondary | ICD-10-CM | POA: Diagnosis not present

## 2017-10-31 DIAGNOSIS — I10 Essential (primary) hypertension: Secondary | ICD-10-CM | POA: Diagnosis not present

## 2017-10-31 DIAGNOSIS — I442 Atrioventricular block, complete: Secondary | ICD-10-CM

## 2017-10-31 LAB — CUP PACEART INCLINIC DEVICE CHECK
Battery Voltage: 2.99 V
Brady Statistic RA Percent Paced: 60 %
Brady Statistic RV Percent Paced: 98 %
Implantable Lead Location: 753859
Implantable Lead Location: 753860
Lead Channel Impedance Value: 375 Ohm
Lead Channel Impedance Value: 462.5 Ohm
Lead Channel Sensing Intrinsic Amplitude: 2.6 mV
Lead Channel Setting Pacing Amplitude: 1 V
Lead Channel Setting Pacing Pulse Width: 0.4 ms
MDC IDC LEAD IMPLANT DT: 20170202
MDC IDC LEAD IMPLANT DT: 20170202
MDC IDC MSMT BATTERY REMAINING LONGEVITY: 96 mo
MDC IDC MSMT LEADCHNL RV PACING THRESHOLD AMPLITUDE: 0.75 V
MDC IDC MSMT LEADCHNL RV PACING THRESHOLD PULSEWIDTH: 0.4 ms
MDC IDC MSMT LEADCHNL RV SENSING INTR AMPL: 12 mV
MDC IDC PG IMPLANT DT: 20170202
MDC IDC SESS DTM: 20190301225354
MDC IDC SET LEADCHNL RA PACING AMPLITUDE: 2.5 V
MDC IDC SET LEADCHNL RV SENSING SENSITIVITY: 2 mV
Pulse Gen Serial Number: 7864186

## 2017-10-31 LAB — POCT INR: INR: 2.1

## 2017-10-31 NOTE — Patient Instructions (Addendum)
Description   Continue  taking 1 tablet every day except 1.5 tablets on Wednesdays. Recheck INR in 3 weeks - Call . Coumadin Clinic [phone] with any concerns Scheduled for surgery on eyelids on March 14th will call pt with any changes

## 2017-10-31 NOTE — Patient Instructions (Signed)

## 2017-10-31 NOTE — Progress Notes (Signed)
HPI Levi [name] returns today for followup. He is a pleasant obese 84 yo man with a h/o CAD, s/p CABG, HTN who was found to have symptomatic 2:1 AV block and underwent PPM insertion. He has been unable to lose weight.He actually lost weight but has gained it back. He has been bothered by dyspnea with exertion and peripheral edema. He denies medical non-compliance.  No Known Allergies   Current Outpatient Medications  Medication Sig Dispense Refill  . aspirin 81 MG tablet Take 1 tablet (81 mg total) by mouth at bedtime. 30 tablet   . carvedilol (COREG) 6.25 MG tablet Take 1 tablet (6.25 mg total) by mouth 2 (two) times daily. 180 tablet 3  . furosemide (LASIX) 40 MG tablet     . nitroGLYCERIN (NITROSTAT) 0.4 MG SL tablet Place 0.4 mg under the tongue every 5 (five) minutes x 3 doses as needed for chest pain.    . polyethylene glycol powder (GLYCOLAX/MIRALAX) powder Take 1 Container by mouth daily.     . pravastatin (PRAVACHOL) 20 MG tablet TAKE 1 TABLET BY MOUTH  DAILY 90 tablet 3  . tamsulosin (FLOMAX) 0.4 MG CAPS capsule     . warfarin (COUMADIN) 5 MG tablet TAKE 1 TABLET BY MOUTH  DAILY 120 tablet 1   No current facility-administered medications for this visit.      Past Medical History:  Diagnosis Date  . Asthma   . Benign neoplasm of colon   . CAD (coronary artery disease) Dec 2011   s/p CABG per Dr. Roxy [name]; had normal EF; All SVGs occluded per follow up cath with only LIMA to LAD patent; s/p PCI of the proximal and mid LAD February 2014 per Dr. Burt Knack  . Dyspnea on exertion   . Edema   . Emphysema    "never treated for it" (10/05/2015)  . History of hiatal hernia   . HLD (hyperlipidemia)   . HTN (hypertension)   . Malaise and fatigue   . Obesity   . Osteopenia   . Pneumonia 1960s X 1; 1990s X 1  . Presence of permanent cardiac pacemaker   . Prostate cancer ([place])   . Skin cancer    "face, nose, top of ears, scalp"  . Symptomatic bradycardia    STJ PPM, 10/05/15, Dr.  Lovena [name]    ROS:   All systems reviewed and negative except as noted in the HPI.   Past Surgical History:  Procedure Laterality Date  . ANTERIOR CERVICAL DECOMP/DISCECTOMY FUSION    . BACK SURGERY    . CATARACT EXTRACTION Right   . CORONARY ANGIOPLASTY WITH STENT PLACEMENT  10/14/12   with stent to proximal and mid LAD  . CORONARY ARTERY BYPASS GRAFT  08/23/10   "CABG X4"  . EP IMPLANTABLE DEVICE N/A 10/05/2015   Procedure: Pacemaker Implant;  Surgeon: Levi Lance, MD;  Location: [place] [facility];  Service: Cardiovascular;  Laterality: N/A;  . EP IMPLANTABLE DEVICE N/A 10/06/2015   Procedure: Lead Revision/Repair;  Surgeon: Levi Meredith Leeds, MD;  Location: [place] [facility];  Service: Cardiovascular;  Laterality: N/A;  . INSERT / REPLACE / REMOVE PACEMAKER  10/05/2015  . LEFT HEART CATHETERIZATION WITH CORONARY/GRAFT ANGIOGRAM N/A 09/01/2012   Procedure: LEFT HEART CATHETERIZATION WITH Levi [name];  Surgeon: Levi Dresser, MD;  Location: [facility] CATH LAB;  Service: Cardiovascular;  Laterality: N/A;  . MEDIAN STERNOTOMY  08/23/10  . MOHS SURGERY  "@ least twice"  . PACEMAKER INSERTION  STJ 10/05/15  . PERCUTANEOUS CORONARY STENT INTERVENTION (PCI-S) N/A 10/14/2012   Procedure: PERCUTANEOUS CORONARY STENT INTERVENTION (PCI-S);  Surgeon: Levi Mocha, MD;  Location: [facility] CATH LAB;  Service: Cardiovascular;  Laterality: N/A;  . PROSTATE BIOPSY    . TRANSURETHRAL RESECTION OF PROSTATE       Family History  Problem Relation Age of Onset  . Kidney cancer Father   . Heart attack Father   . Hypertension Mother   . Stroke Mother   . Heart disease Brother   . Skin cancer Brother   . Prostate cancer Brother      Social History   Socioeconomic History  . Marital status: Widowed    Spouse name: Not on file  . Number of children: Not on file  . Years of education: Not on file  . Highest education level: Not on file  Social Needs  . Financial resource strain: Not on  file  . Food insecurity - worry: Not on file  . Food insecurity - inability: Not on file  . Transportation needs - medical: Not on file  . Transportation needs - non-medical: Not on file  Occupational History  . Occupation: retired Information systems manager: RETIRED  Tobacco Use  . Smoking status: Former Smoker    Packs/day: 1.00    Years: 30.00    Pack years: 30.00    Types: Cigarettes    Last attempt to quit: 09/07/1975    Years since quitting: 42.1  . Smokeless tobacco: Former Systems developer    Types: [place] date: 07/27/2015  Substance and Sexual Activity  . Alcohol use: No    Alcohol/week: 0.0 oz  . Drug use: No  . Sexual activity: Not Currently  Other Topics Concern  . Not on file  Social History Narrative  . Not on file     BP 138/88   Pulse 77   Ht 5\' 10"  (1.778 m)   Wt 289 lb (131.1 kg)   BMI 41.47 kg/m   Physical Exam:  obese appearing elderly man, NAD HEENT: Unremarkable Neck:  No JVD, no thyromegally Lymphatics:  No adenopathy Back:  No CVA tenderness Lungs:  Clear except for basilar rales.  HEART:  Regular rate rhythm, no murmurs, no rubs, no clicks Abd:  soft, positive bowel sounds, no organomegally, no rebound, no guarding Ext:  2 plus pulses, no edema, no cyanosis, no clubbing Skin:  No rashes no nodules Neuro:  CN II through XII intact, motor grossly intact   DEVICE  Normal device function.  See PaceArt for details.   Assess/Plan: 1. Heart block - he is asymptomatic, s/p PPM insertion. No escape rhythm today 2. PPM - his St. Jude DDD device is working normally. Levi follow. 3. CAD - left heart cath results noted. He is not having angina. He has occluded grafts. 4. Obesity - I have strongly encouraged the patient to lose weight.  [name].D.

## 2017-10-31 NOTE — Progress Notes (Addendum)
Cardiology Office Note    Date:  10/31/2017   ID:  Levi [name], DOB 1929/06/07, MRN [id number]  PCP:  Bernerd Limbo, MD  Cardiologist: Dr. Harrington Challenger EPS Dr. Lovena Le  No chief complaint on file.   History of Present Illness:   Levi [name] is a 84 y.o. male with history of CAD status post CABG in 2011, DES to the LAD in 2014, PAF( chadsvASC equals 4) permanent pacemaker ([place])   In 10/2016 felt bad  L heart cath recommended  He did not want to have done as wife ill  His wife then passed away.   Went to see Dr. Maylon Peppers at [facility] for second opinion  Lasix increased  He was scheduled for nuclear stress test and PFTs.  And underwent cardiac catheterization 01/2017 [facility] which showed:  3-vessel CAD. LM is diffusely diseased with 50% distal stenosis. LAD has a 80% mid instent restenosis, LCX has serial 50-60% stenoses in the mid segment, OM2 is a small vessel and has a 70% ostial stenosis. RCA has long diffuse 50-60% proximal and 60% distal stenosis. LIMA to LAD is patent with a kink in the mid segment. Flow is normal. LV-Gram reveals EF 50%. LVEDP is 8 mmHg.   Left radial artery was very small and a 31F catheter could not be advanced. Right femoral artery access was obtained for the procedure. Femoral arteriotomy was closed with 51F Angioseal device.  SVG grafts are known to be chronically occluded, so not injected   REcommendations was for medical Rx    Patient went to the ER 08/07/17 with syncope.  He said he was making breakfast when he hit the floor.  When he got to the emergency room he had SVT and atrial flutter.  Pacer rep from [place] checked his pacer and said he had atrial fibrillation prior to his syncope.  He also had orthostatic hypotension.  His amlodipine and Spironolactone were stopped.  He was still dizzy when he got home and blood pressures were running low so losartan was stopped.  He was seen by B Bhagat in December   BP was still low  (70 to 95 systollc ) Wt was ddown 20  lbs  At that visit his symptoms were better   He was not orthostatic    The patient deneis dizziness now  He denies CP  Breathing is stable  Is not that active  He still has LE edema      Past Medical History:  Diagnosis Date  . Asthma   . Benign neoplasm of colon   . CAD (coronary artery disease) Dec 2011   s/p CABG per Dr. Roxy Manns; had normal EF; All SVGs occluded per follow up cath with only LIMA to LAD patent; s/p PCI of the proximal and mid LAD February 2014 per Dr. Burt Knack  . Dyspnea on exertion   . Edema   . Emphysema    "never treated for it" (10/05/2015)  . History of hiatal hernia   . HLD (hyperlipidemia)   . HTN (hypertension)   . Malaise and fatigue   . Obesity   . Osteopenia   . Pneumonia 1960s X 1; 1990s X 1  . Presence of permanent cardiac pacemaker   . Prostate cancer ([place])   . Skin cancer    "face, nose, top of ears, scalp"  . Symptomatic bradycardia    STJ PPM, 10/05/15, Dr. Lovena Le    Past Surgical History:  Procedure Laterality Date  . ANTERIOR CERVICAL  DECOMP/DISCECTOMY FUSION    . BACK SURGERY    . CATARACT EXTRACTION Right   . CORONARY ANGIOPLASTY WITH STENT PLACEMENT  10/14/12   with stent to proximal and mid LAD  . CORONARY ARTERY BYPASS GRAFT  08/23/10   "CABG X4"  . EP IMPLANTABLE DEVICE N/A 10/05/2015   Procedure: Pacemaker Implant;  Surgeon: [name], MD;  Location: [place] CV LAB;  Service: Cardiovascular;  Laterality: N/A;  . EP IMPLANTABLE DEVICE N/A 10/06/2015   Procedure: Lead Revision/Repair;  Surgeon: [name], MD;  Location: [place] CV LAB;  Service: Cardiovascular;  Laterality: N/A;  . INSERT / REPLACE / REMOVE PACEMAKER  10/05/2015  . LEFT HEART CATHETERIZATION WITH CORONARY/GRAFT ANGIOGRAM N/A 09/01/2012   Procedure: LEFT HEART CATHETERIZATION WITH [name];  Surgeon: [name], MD;  Location: [facility] CATH LAB;  Service: Cardiovascular;  Laterality: N/A;  . MEDIAN STERNOTOMY  08/23/10  . MOHS SURGERY  "@  least twice"  . PACEMAKER INSERTION     STJ 10/05/15  . PERCUTANEOUS CORONARY STENT INTERVENTION (PCI-S) N/A 10/14/2012   Procedure: PERCUTANEOUS CORONARY STENT INTERVENTION (PCI-S);  Surgeon: [name], MD;  Location: [facility] CATH LAB;  Service: Cardiovascular;  Laterality: N/A;  . PROSTATE BIOPSY    . TRANSURETHRAL RESECTION OF PROSTATE      Current Medications: Current Meds  Medication Sig  . aspirin 81 MG tablet Take 1 tablet (81 mg total) by mouth at bedtime.  . carvedilol (COREG) 6.25 MG tablet Take 1 tablet (6.25 mg total) by mouth 2 (two) times daily.  . furosemide (LASIX) 40 MG tablet   . nitroGLYCERIN (NITROSTAT) 0.4 MG SL tablet Place 0.4 mg under the tongue every 5 (five) minutes x 3 doses as needed for chest pain.  . polyethylene glycol powder (GLYCOLAX/MIRALAX) powder Take 1 Container by mouth daily.   . pravastatin (PRAVACHOL) 20 MG tablet TAKE 1 TABLET BY MOUTH  DAILY  . tamsulosin (FLOMAX) 0.4 MG CAPS capsule   . warfarin (COUMADIN) 5 MG tablet TAKE 1 TABLET BY MOUTH  DAILY     Allergies:   Patient has no known allergies.   Social History   Socioeconomic History  . Marital status: Widowed    Spouse name: None  . Number of children: None  . Years of education: None  . Highest education level: None  Social Needs  . Financial resource strain: None  . Food insecurity - worry: None  . Food insecurity - inability: None  . Transportation needs - medical: None  . Transportation needs - non-medical: None  Occupational History  . Occupation: retired Information systems manager: RETIRED  Tobacco Use  . Smoking status: Former Smoker    Packs/day: 1.00    Years: 30.00    Pack years: 30.00    Types: Cigarettes    Last attempt to quit: 09/07/1975    Years since quitting: 42.1  . Smokeless tobacco: Former Systems developer    Types: [place] date: 07/27/2015  Substance and Sexual Activity  . Alcohol use: No    Alcohol/week: 0.0 oz  . Drug use: No  . Sexual activity: Not Currently   Other Topics Concern  . None  Social History Narrative  . None     Family History:  The patient's family history includes Heart attack in his father; Heart disease in his brother; Hypertension in his mother; Kidney cancer in his father; Prostate cancer in his brother; Skin cancer in his brother; Stroke in  his mother.   ROS:   Please see the history of present illness.     All other systems reviewed and are negative.   PHYSICAL EXAM:   VS:  BP 138/88   Pulse 77   Ht 5\' 10"  (1.778 m)   Wt 289 lb 12.8 oz (131.5 kg)   SpO2 96%   BMI 41.58 kg/m   Physical Exam  GEN: \Morbidly obese 84 yo in no acute distress  HEENT: normal  Neck: Neck full  NO Definite JVD  No  carotid bruits, or masses Cardiac:RRR; no murmurs, rubs, or gallops  Respiratory:  clear to auscultation bilaterally, normal work of breathing GI: Obese  Nontender Ext:=1+ edema bilateral legs   MS: no deformity or atrophy  Skin:  NO rashes  Neuro:  Alert and Oriented x 3,  Psych: quiet    Wt Readings from Last 3 Encounters:  10/31/17 289 lb 12.8 oz (131.5 kg)  08/21/17 273 lb 12.8 oz (124.2 kg)  08/14/17 268 lb 1.9 oz (121.6 kg)      Studies/Labs Reviewed:   EKG:  Recent Labs: 01/15/2017: NT-Pro BNP 762 08/06/2017: B Natriuretic Peptide 143.1 08/07/2017: BUN 25; Creatinine, Ser 1.27; Hemoglobin 15.2; Platelets 220; Potassium 4.4; Sodium 139   Lipid Panel    Component Value Date/Time   CHOL 113 06/01/2014 0930   TRIG 115.0 06/01/2014 0930   HDL 32.40 (L) 06/01/2014 0930   CHOLHDL 3 06/01/2014 0930   VLDL 23.0 06/01/2014 0930   LDLCALC 58 06/01/2014 0930   LDLDIRECT 70.7 06/10/2013 1344       PLAN:  In order of  Problems listed above:  1  CAD  Pt with diffuse disease as noted above with recent cath  Not a candiate for intervention.  Plan for continued medical Rx   He denies CP    Activity is limted ON exam, he has evid of volume overload  Will get labs    Need to be careful with syncope earlier this  winter   Syncope  Pt with afib with RVR by report  Also hypotensive  Lasix was decreased   SVT and rapid atrial fibrillation although I cannot find documentation of this.  Also had orthostatic hypotension.  Blood pressures from home were low    He was 20lb lighter   Imdure was stopped and lasix changed to prn     PAF Pt  On coumadin  In 2016 Xarelto was started but per the pt's request he was swiitched to Coumadin   He has been maintained on this since.   His daughter who is with him today questions if he can be switched to Eliquis   This can be done  He is due to have eyelid surgery and I would start it after that. Will continue to follow for afibv with RVR  Pacer in place.  Diastolic CHF   Volume appears up some today  He is not uncomfortable but not that acitve   Will check labs Takes lasix as needed   Hx CHB  S/p PPM  Follows with [name]   Appt today    Hx HL  Continue statin    Spent more than 40 min of time with patient and daughter   Will f/u with labs  Plan to f/u in clnic in 3 months   Medication Adjustments/Labs and Tests Ordered: Current medicines are reviewed at length with the patient today.  Concerns regarding medicines are outlined above.  Medication changes, Labs and Tests  ordered today are listed in the Patient Instructions below. There are no Patient Instructions on file for this visit.   Signed, [name], MD  10/31/2017 4:18 PM    [facility] [place], [place], [place]  [address] Phone: [phone]; Fax: [phone]

## 2017-10-31 NOTE — Patient Instructions (Signed)
Medication Instructions:  Your physician has recommended you make the following change in your medication: We will call you with Eliquis dose information   Labwork: Lab work to be done today--BNP and BMP  Testing/Procedures: none  Follow-Up: Your physician recommends that you schedule a follow-up appointment in: 3 months with Dr. Harrington Challenger or Melina Copa, PA    Any Other Special Instructions Will Be Listed Below (If Applicable).     If you need a refill on your cardiac medications before your next appointment, please call your pharmacy.

## 2017-11-01 LAB — BASIC METABOLIC PANEL
BUN/Creatinine Ratio: 16 (ref 10–24)
BUN: 18 mg/dL (ref 8–27)
CO2: 25 mmol/L (ref 20–29)
CREATININE: 1.11 mg/dL (ref 0.76–1.27)
Calcium: 8.7 mg/dL (ref 8.6–10.2)
Chloride: 104 mmol/L (ref 96–106)
GFR calc Af Amer: 69 mL/min/{1.73_m2} (ref >59)
GFR calc non Af Amer: 60 mL/min/{1.73_m2} (ref >59)
GLUCOSE: 96 mg/dL (ref 65–99)
Potassium: 3.9 mmol/L (ref 3.5–5.2)
Sodium: 142 mmol/L (ref 134–144)

## 2017-11-01 LAB — PRO B NATRIURETIC PEPTIDE: NT-PRO BNP: 2503 pg/mL — AB (ref 0–486)

## 2017-11-03 ENCOUNTER — Ambulatory Visit (INDEPENDENT_AMBULATORY_CARE_PROVIDER_SITE_OTHER): Payer: Medicare Other | Admitting: *Deleted

## 2017-11-03 ENCOUNTER — Telehealth: Payer: Self-pay | Admitting: *Deleted

## 2017-11-03 DIAGNOSIS — I442 Atrioventricular block, complete: Secondary | ICD-10-CM

## 2017-11-03 MED ORDER — APIXABAN 5 MG PO TABS: 5.0000 mg | ORAL_TABLET | Freq: Two times a day (BID) | ORAL | 3 refills | Status: DC

## 2017-11-03 NOTE — Telephone Encounter (Signed)
Ordered eliquis 5 mg to [facility] 90 day supply, to start when instructed to after eye surgery.   I left message for patient to call back.

## 2017-11-03 NOTE — Telephone Encounter (Signed)
-----   Message from [name], MD sent at 11/02/2017 11:50 PM EST ----- Electrolytes and kidney function are normal  Fluid is up I would recomm add lasix a few times per week (I am aware that he had BP problems before but I think not daily he should tolerate) F/U BMET and BNP in 1 month Pt currently on Coumadin   Wants to switch to Eliquis  After eye surgery start Eliquis 5 mg bid Call in to mail in pharmacy

## 2017-11-03 NOTE — Telephone Encounter (Signed)
Spoke with pt's daughter and she states he is now scheduled for surgery on March 11th Instructed that will call and get clearance form sent to [place] as Dr Harrington Challenger is in the office today Spoke with [facility] and they state Dr Lorina Rabon is doing the surgery and that will send over clearance form Spoke with Dr Harrington Challenger and she states to hold coumadin for 5 days before surgery and then when instructed by Opthomologist doing surgery to restart anticoagulant to start Eliquis 5mg  q 12 hours Discontinue coumadin and ASA Clearance form faxed to Dr  Lorina Rabon Age 69years Wt 131.1kg 10/31/2017  Saw Dr Harrington Challenger on 10/31/2017 (10/31/2017 Hgb 15.2 HCT 46.4 SrCr 1.11 Michalene Dr Alan Ripper nurse is to send in to [facility] the Eliquis 5mg  q 12hous  Spoke again with [name] pt's daughter and instructed that his last day to take coumadin is tomorrow March 5th and to stop ASA today  Also instructed to start Eliquis 5mg  q 12hours when instructed by Opthomologist to restart anticoagulation and to take Eliquis 5mg  q 12 hours and spoke with her regarding Eliquis and management of takjng Eliquis  To take q 12 hours and not to take 2 tablets if misses a dose monitor for any sign of bleeding and for any sign of stroke To inform any Doctor he goes to that he is on Eliquis and if has fall and hits head to call MD regarding this . Infomed that will see pt back in 1 month to check cbc and bmet and did make an appointment for 1 month followup  [name] states understanding

## 2017-11-04 ENCOUNTER — Telehealth: Payer: Self-pay | Admitting: Cardiology

## 2017-11-04 NOTE — Telephone Encounter (Signed)
Follow up  ° ° °Patient is returning call.  °

## 2017-11-04 NOTE — Telephone Encounter (Signed)
Spoke with pt and went over lab results and recommendations per Dr. Harrington Challenger.  Pt aware that Eliquis has been sent in and he is to switch to Eliquis after eye surgery once surgeon says safe to restart anticoags.    Pt is currently taking Furosemide 40mg  QD.  Advised I would send message to Dr. Harrington Challenger for clarification on what she would like for pt to do with diuretics since fluid level is up?

## 2017-11-04 NOTE — Progress Notes (Signed)
Remote pacemaker transmission.   

## 2017-11-04 NOTE — Telephone Encounter (Signed)
Spoke with pt and reminded pt of remote transmission that is due today. Pt verbalized understanding.   

## 2017-11-05 ENCOUNTER — Encounter: Payer: Self-pay | Admitting: Cardiology

## 2017-11-06 LAB — CUP PACEART REMOTE DEVICE CHECK
Date Time Interrogation Session: 20190307090427
Implantable Lead Implant Date: 20170202
Implantable Lead Location: 753860
MDC IDC LEAD IMPLANT DT: 20170202
MDC IDC LEAD LOCATION: 753859
MDC IDC PG IMPLANT DT: 20170202
MDC IDC PG SERIAL: 7864186
Pulse Gen Model: 2240

## 2017-11-10 ENCOUNTER — Telehealth: Payer: Self-pay | Admitting: Internal Medicine

## 2017-11-10 DIAGNOSIS — H53453 Other localized visual field defect, bilateral: Secondary | ICD-10-CM | POA: Diagnosis not present

## 2017-11-10 DIAGNOSIS — H02834 Dermatochalasis of left upper eyelid: Secondary | ICD-10-CM | POA: Diagnosis not present

## 2017-11-10 DIAGNOSIS — H02831 Dermatochalasis of right upper eyelid: Secondary | ICD-10-CM | POA: Diagnosis not present

## 2017-11-10 DIAGNOSIS — H53481 Generalized contraction of visual field, right eye: Secondary | ICD-10-CM | POA: Diagnosis not present

## 2017-11-10 DIAGNOSIS — H02413 Mechanical ptosis of bilateral eyelids: Secondary | ICD-10-CM | POA: Diagnosis not present

## 2017-11-10 MED ORDER — FUROSEMIDE 40 MG PO TABS: ORAL_TABLET | ORAL | 3 refills | Status: DC

## 2017-11-10 MED ORDER — POTASSIUM CHLORIDE CRYS ER 20 MEQ PO TBCR: 20.0000 meq | EXTENDED_RELEASE_TABLET | Freq: Every day | ORAL | 0 refills | Status: DC

## 2017-11-10 MED ORDER — POTASSIUM CHLORIDE CRYS ER 20 MEQ PO TBCR: EXTENDED_RELEASE_TABLET | ORAL | 3 refills | Status: DC

## 2017-11-10 NOTE — Telephone Encounter (Signed)
New message  Pt c/o medication issue:  1. Name of Medication: Eliquis  2. How are you currently taking this medication (dosage and times per day)? Take 1 tablet (5 mg total) by mouth 2 (two) times daily. To start when instructed after eye surgery  3. Are you having a reaction (difficulty breathing--STAT)? no  4. What is your medication issue? Pt daughter verbalized that the pt will not receive his Eliquis in the mail until tomorrow, so they would like to know if he should take the [place] today or just wait until he receives his Eliquis tomorrow to take that. Please call

## 2017-11-10 NOTE — Telephone Encounter (Signed)
Called patient's daughter back. Pt is en route to his eye surgery appointment now.  His Eliquis is due to arrive in the mail tomorrow early afternoon. Wanted to know what to do in the meantime.  I spoke with Jinny Blossom, PharmD. Advised patient's daughter to come to office today for samples of Eliquis so that he has them for whenever the eye doctor wants to start it.  Aware that this is a twice a day medication.  Also spoke with patient's daughter re: recent lab results and recommendations by Dr. Harrington Challenger.  Had been unable to reach patient previously. She understands he is to take lasix 40 every day except 2 x/week take 80 mg (every 3rd day).  Also understands to take potassium chloride 20 meq on those 2 days when lasix is doubled.  Will send 2 wk supply to [facility] and long term prescription to [facility].

## 2017-11-13 ENCOUNTER — Encounter: Payer: Self-pay | Admitting: *Deleted

## 2017-11-13 ENCOUNTER — Telehealth: Payer: Self-pay | Admitting: *Deleted

## 2017-11-13 NOTE — Telephone Encounter (Signed)
Reviewed with Jinny Blossom, PharmD. Since patient needs labs for Dr. Harrington Challenger in 3 weeks, we have moved coumadin clinic appointment up to April 4 to accomodate this.  Left detailed message on voice mail of next appointment at coumadin (for 1 mo eliquis check) and lab.

## 2017-11-20 ENCOUNTER — Ambulatory Visit: Payer: Medicare Other | Admitting: Internal Medicine

## 2017-11-28 ENCOUNTER — Encounter: Payer: Medicare Other | Admitting: Internal Medicine

## 2017-12-01 ENCOUNTER — Other Ambulatory Visit: Payer: Self-pay | Admitting: *Deleted

## 2017-12-01 ENCOUNTER — Telehealth: Payer: Self-pay | Admitting: *Deleted

## 2017-12-01 DIAGNOSIS — I5032 Chronic diastolic (congestive) heart failure: Secondary | ICD-10-CM

## 2017-12-01 MED ORDER — POTASSIUM CHLORIDE CRYS ER 20 MEQ PO TBCR: EXTENDED_RELEASE_TABLET | ORAL | 3 refills | Status: DC

## 2017-12-01 NOTE — Telephone Encounter (Signed)
Refilled potassium to local and mail order pharmacies. Pt is to take 1 tablet (20 meq) on days he takes lasix 80 mg.  Called patient's daughter, [name] back and informed medications have been sent in. Also confirmed appointment for labs, same day as Coumadin appt 4/8 at 3pm

## 2017-12-01 NOTE — Telephone Encounter (Signed)
Patients daughter [name] called and requested refills on potassium be sent [facility] as the patient received a letter indicating that they never received an rx. He is out so he also needs a short term supply sent to [facility]. Please clarify how this should be sent in as he has it listed twice on his current med list with two different sigs. Thanks, MI

## 2017-12-02 ENCOUNTER — Telehealth: Payer: Self-pay | Admitting: Internal Medicine

## 2017-12-02 NOTE — Telephone Encounter (Signed)
Spoke with pt's daughter Lynelle Smoke she states that pt has been depress, because this month a year ago pt's wife died. Daughter said that pt is overweight and is hard for him to get around. Pt has been C/O of SOB with activity one day the next day he states that he is fine. Daughter was encouraged to call his PCP's office to see if can get a prescription for depression . Daughter states that pt has medication for depression, but he refused to take it. Daughter states that the family don't know what to do. Daughter was reminded that pt has an appointment for labs on April 8 th and if he has  fluid overloaded in his system it will show(BNP) . We will call Tammy back when the results are reviewed with recommendations. Daughter verbalized understanding.

## 2017-12-02 NOTE — Telephone Encounter (Signed)
New message  Patient daughter [name] calling. Please call [phone]  Pt c/o Shortness Of Breath: STAT if SOB developed within the last 24 hours or pt is noticeably SOB on the phone  1. Are you currently SOB (can you hear that pt is SOB on the phone)? Daughter calling  2. How long have you been experiencing SOB? 2 weeks  3. Are you SOB when sitting or when up moving around? Moving around  4. Are you currently experiencing any other symptoms? No

## 2017-12-08 ENCOUNTER — Ambulatory Visit (INDEPENDENT_AMBULATORY_CARE_PROVIDER_SITE_OTHER): Payer: Medicare Other | Admitting: *Deleted

## 2017-12-08 ENCOUNTER — Other Ambulatory Visit: Payer: Medicare Other | Admitting: *Deleted

## 2017-12-08 DIAGNOSIS — Z5181 Encounter for therapeutic drug level monitoring: Secondary | ICD-10-CM | POA: Diagnosis not present

## 2017-12-08 DIAGNOSIS — I5032 Chronic diastolic (congestive) heart failure: Secondary | ICD-10-CM | POA: Diagnosis not present

## 2017-12-08 DIAGNOSIS — I480 Paroxysmal atrial fibrillation: Secondary | ICD-10-CM | POA: Diagnosis not present

## 2017-12-08 DIAGNOSIS — L570 Actinic keratosis: Secondary | ICD-10-CM | POA: Diagnosis not present

## 2017-12-08 DIAGNOSIS — L821 Other seborrheic keratosis: Secondary | ICD-10-CM | POA: Diagnosis not present

## 2017-12-08 DIAGNOSIS — L853 Xerosis cutis: Secondary | ICD-10-CM | POA: Diagnosis not present

## 2017-12-08 DIAGNOSIS — Z85828 Personal history of other malignant neoplasm of skin: Secondary | ICD-10-CM | POA: Diagnosis not present

## 2017-12-08 NOTE — Patient Instructions (Signed)
  A full discussion of the nature of anticoagulants has been carried out.  A benefit/risk analysis has been presented to the patient, so that they understand the justification for choosing anticoagulation with Eliquis at this time.  The need for compliance is stressed.  Pt is aware to take the medication twice daily.  Side effects of potential bleeding are discussed, including unusual colored urine or stools, coughing up blood or coffee ground emesis, nose bleeds or serious fall or head trauma.  Discussed signs and symptoms of stroke. The patient should avoid any over the counter items containing aspirin or ibuprofen. Suggested to use Tylenol for pain relief if no interaction.   Avoid alcohol consumption.   Call if any signs of abnormal bleeding.  Discussed financial obligations and resolved any difficulty in obtaining medication.  Next lab test in 6 months.

## 2017-12-09 LAB — BASIC METABOLIC PANEL
BUN/Creatinine Ratio: 14 (ref 10–24)
BUN: 17 mg/dL (ref 8–27)
CHLORIDE: 101 mmol/L (ref 96–106)
CO2: 24 mmol/L (ref 20–29)
Calcium: 8.5 mg/dL — ABNORMAL LOW (ref 8.6–10.2)
Creatinine, Ser: 1.25 mg/dL (ref 0.76–1.27)
GFR calc Af Amer: 60 mL/min/{1.73_m2} (ref >59)
GFR, EST NON AFRICAN AMERICAN: 52 mL/min/{1.73_m2} — AB (ref >59)
GLUCOSE: 107 mg/dL — AB (ref 65–99)
Potassium: 3.7 mmol/L (ref 3.5–5.2)
SODIUM: 140 mmol/L (ref 134–144)

## 2017-12-09 LAB — CBC
Hematocrit: 40.7 % (ref 37.5–51.0)
Hemoglobin: 13 g/dL (ref 13.0–17.7)
MCH: 29.3 pg (ref 26.6–33.0)
MCHC: 31.9 g/dL (ref 31.5–35.7)
MCV: 92 fL (ref 79–97)
Platelets: 131 10*3/uL — ABNORMAL LOW (ref 150–379)
RBC: 4.44 x10E6/uL (ref 4.14–5.80)
RDW: 14.4 % (ref 12.3–15.4)
WBC: 6.2 10*3/uL (ref 3.4–10.8)

## 2017-12-09 LAB — PRO B NATRIURETIC PEPTIDE: NT-Pro BNP: 2091 pg/mL — ABNORMAL HIGH (ref 0–486)

## 2017-12-09 NOTE — Progress Notes (Signed)
Pt was started on Eliquis 5mg  BID for Afib on 11/10/17 by Dr Harrington Challenger.   Reviewed patients medication list.  Pt is not currently on any combined P-gp and strong CYP3A4 inhibitors/inducers (ketoconazole, traconazole, ritonavir, carbamazepine, phenytoin, rifampin, St. John's wort).  Reviewed labs: SCr 1.25, Weight 130 Kg, Age 84 yrs old.   Dose appropriate based on age, weight, and SCr.  Hgb and HCT are 13.0/40.7.   A full discussion of the nature of anticoagulants has been carried out.  A benefit/risk analysis has been presented to the patient, so that they understand the justification for choosing anticoagulation with Eliquis at this time.  The need for compliance is stressed.  Pt is aware to take the medication twice daily.  Side effects of potential bleeding are discussed, including unusual colored urine or stools, coughing up blood or coffee ground emesis, nose bleeds or serious fall or head trauma.  Discussed signs and symptoms of stroke. The patient should avoid any OTC items containing aspirin or ibuprofen.  Avoid alcohol consumption.   Call if any signs of abnormal bleeding.  Discussed financial obligations and resolved any difficulty in obtaining medication.  Next lab test in 6 months.   12/09/17- 2 10:00am- Spoke with daughter [name] and review criteria and that pt will continue Eliquis 5mg  BID until follow up appt we scheduled in 35mths. Understanding verbalized.

## 2017-12-10 ENCOUNTER — Telehealth: Payer: Self-pay | Admitting: *Deleted

## 2017-12-10 DIAGNOSIS — I5032 Chronic diastolic (congestive) heart failure: Secondary | ICD-10-CM

## 2017-12-10 MED ORDER — POTASSIUM CHLORIDE CRYS ER 20 MEQ PO TBCR: EXTENDED_RELEASE_TABLET | ORAL | 3 refills | Status: DC

## 2017-12-10 MED ORDER — FUROSEMIDE 40 MG PO TABS: ORAL_TABLET | ORAL | 3 refills | Status: DC

## 2017-12-10 NOTE — Telephone Encounter (Signed)
Informed patient's daughter [name] [facility]) that per Dr. Harrington Challenger patient needs to change lasix to 40 mg daily, alternating with 80 mg daily. And on days he takes 80 mg lasix he needs to take 20 meq potassium chloride. Appointment made to recheck labs on 12/25/17. [name] voices understanding of instructions. Aware I will send instruction change to [facility] with note that patient will call when refill is needed.

## 2017-12-10 NOTE — Telephone Encounter (Signed)
-----   Message from [name], MD sent at 12/09/2017 11:13 PM EDT ----- Electrolytes are normal    Kidney function is relatively stable Fluid is up I would recommend increasing lasix to 80 every other day   (want 3 to 4 x per wkk) Potassium is increased on days of 80 F/U BMET and BNP in 2 wks Watch salt intake

## 2017-12-11 ENCOUNTER — Other Ambulatory Visit: Payer: Self-pay | Admitting: Internal Medicine

## 2017-12-11 MED ORDER — POTASSIUM CHLORIDE CRYS ER 20 MEQ PO TBCR: EXTENDED_RELEASE_TABLET | ORAL | 3 refills | Status: DC

## 2017-12-11 MED ORDER — FUROSEMIDE 40 MG PO TABS: ORAL_TABLET | ORAL | 3 refills | Status: DC

## 2017-12-11 NOTE — Telephone Encounter (Signed)
Pt's medications were resent in with correct quantity. Confirmation received.

## 2017-12-18 ENCOUNTER — Telehealth: Payer: Self-pay | Admitting: Internal Medicine

## 2017-12-18 DIAGNOSIS — I480 Paroxysmal atrial fibrillation: Secondary | ICD-10-CM

## 2017-12-18 NOTE — Telephone Encounter (Signed)
New Message:      Pt's daughter is returning a call.

## 2017-12-18 NOTE — Telephone Encounter (Signed)
Notes recorded by [name], MD on 12/13/2017 at 8:37 PM EDT Have pt come in Thursday to repeat CBC and get other labs (BMET, BNP) that hw was scheduled for soon after.   Scheduled appointment for labs (cbc, bmet, bnp) for tomorrow.  Patient's daughter, [name] is aware and agrees with this plan.  She knows this replaced the lab appointment pt was previously scheduled for.   Daughter [name] will be [place] and asks that her sister, Arlington Sigmund be called with results.

## 2017-12-19 ENCOUNTER — Other Ambulatory Visit: Payer: Medicare Other | Admitting: *Deleted

## 2017-12-19 DIAGNOSIS — I480 Paroxysmal atrial fibrillation: Secondary | ICD-10-CM | POA: Diagnosis not present

## 2017-12-19 DIAGNOSIS — I5032 Chronic diastolic (congestive) heart failure: Secondary | ICD-10-CM

## 2017-12-19 LAB — BASIC METABOLIC PANEL
BUN / CREAT RATIO: 17 (ref 10–24)
BUN: 20 mg/dL (ref 8–27)
CO2: 27 mmol/L (ref 20–29)
CREATININE: 1.18 mg/dL (ref 0.76–1.27)
Calcium: 9.1 mg/dL (ref 8.6–10.2)
Chloride: 101 mmol/L (ref 96–106)
GFR, EST AFRICAN AMERICAN: 64 mL/min/{1.73_m2} (ref >59)
GFR, EST NON AFRICAN AMERICAN: 56 mL/min/{1.73_m2} — AB (ref >59)
Glucose: 91 mg/dL (ref 65–99)
Potassium: 4.1 mmol/L (ref 3.5–5.2)
Sodium: 141 mmol/L (ref 134–144)

## 2017-12-19 LAB — CBC
HEMATOCRIT: 43.6 % (ref 37.5–51.0)
HEMOGLOBIN: 14.3 g/dL (ref 13.0–17.7)
MCH: 29.4 pg (ref 26.6–33.0)
MCHC: 32.8 g/dL (ref 31.5–35.7)
MCV: 90 fL (ref 79–97)
Platelets: 167 10*3/uL (ref 150–379)
RBC: 4.86 x10E6/uL (ref 4.14–5.80)
RDW: 14.7 % (ref 12.3–15.4)
WBC: 7.9 10*3/uL (ref 3.4–10.8)

## 2017-12-19 LAB — PRO B NATRIURETIC PEPTIDE: NT-Pro BNP: 3933 pg/mL — ABNORMAL HIGH (ref 0–486)

## 2017-12-25 ENCOUNTER — Other Ambulatory Visit: Payer: Self-pay | Admitting: *Deleted

## 2017-12-25 ENCOUNTER — Other Ambulatory Visit: Payer: Medicare Other

## 2017-12-25 DIAGNOSIS — I480 Paroxysmal atrial fibrillation: Secondary | ICD-10-CM

## 2017-12-25 DIAGNOSIS — R7989 Other specified abnormal findings of blood chemistry: Secondary | ICD-10-CM

## 2017-12-25 DIAGNOSIS — I25709 Atherosclerosis of coronary artery bypass graft(s), unspecified, with unspecified angina pectoris: Secondary | ICD-10-CM

## 2017-12-25 DIAGNOSIS — Z79899 Other long term (current) drug therapy: Secondary | ICD-10-CM

## 2018-01-02 ENCOUNTER — Other Ambulatory Visit: Payer: Medicare Other | Admitting: *Deleted

## 2018-01-02 DIAGNOSIS — I480 Paroxysmal atrial fibrillation: Secondary | ICD-10-CM

## 2018-01-02 DIAGNOSIS — I25709 Atherosclerosis of coronary artery bypass graft(s), unspecified, with unspecified angina pectoris: Secondary | ICD-10-CM

## 2018-01-02 DIAGNOSIS — Z79899 Other long term (current) drug therapy: Secondary | ICD-10-CM

## 2018-01-02 DIAGNOSIS — R7989 Other specified abnormal findings of blood chemistry: Secondary | ICD-10-CM

## 2018-01-02 DIAGNOSIS — I5032 Chronic diastolic (congestive) heart failure: Secondary | ICD-10-CM | POA: Diagnosis not present

## 2018-01-03 LAB — BASIC METABOLIC PANEL
BUN/Creatinine Ratio: 16 (ref 10–24)
BUN: 17 mg/dL (ref 8–27)
CHLORIDE: 101 mmol/L (ref 96–106)
CO2: 23 mmol/L (ref 20–29)
Calcium: 9.1 mg/dL (ref 8.6–10.2)
Creatinine, Ser: 1.09 mg/dL (ref 0.76–1.27)
GFR calc non Af Amer: 61 mL/min/{1.73_m2} (ref >59)
GFR, EST AFRICAN AMERICAN: 71 mL/min/{1.73_m2} (ref >59)
GLUCOSE: 113 mg/dL — AB (ref 65–99)
POTASSIUM: 4 mmol/L (ref 3.5–5.2)
Sodium: 140 mmol/L (ref 134–144)

## 2018-01-03 LAB — PRO B NATRIURETIC PEPTIDE: NT-PRO BNP: 888 pg/mL — AB (ref 0–486)

## 2018-01-06 ENCOUNTER — Telehealth: Payer: Self-pay | Admitting: Internal Medicine

## 2018-01-06 NOTE — Telephone Encounter (Signed)
Returned Patients daughters call re: lab results. Advised her that his labs appeared stable and waiting to hear from Dr. Harrington Challenger if she would like to make any new changes.

## 2018-01-06 NOTE — Telephone Encounter (Signed)
New Message:      Pt's daughter is calling to get results for father. Pt's daughter is wanting to know does he need to continue taking his medication the same per Dr. Harrington Challenger

## 2018-01-15 ENCOUNTER — Telehealth: Payer: Self-pay | Admitting: Internal Medicine

## 2018-01-15 DIAGNOSIS — I5032 Chronic diastolic (congestive) heart failure: Secondary | ICD-10-CM

## 2018-01-15 NOTE — Telephone Encounter (Signed)
Pt's daughter is calling Levi [name]:   Want to know if her dad is suppose to continue to double his fluid pill.  Please advise

## 2018-01-15 NOTE — Telephone Encounter (Signed)
Labs available for review by Dr. Harrington Challenger. Will forward to her for recommendations.

## 2018-01-16 NOTE — Telephone Encounter (Signed)
Spoke with patient's daughter. Will arrange for pt to come next week for lab work. He continues to take lasix 80 mg most days of week at least, as ordered.   Will continue to do so for now. He will come Tues for BMET and BNP.

## 2018-01-16 NOTE — Telephone Encounter (Signed)
Have pt get BMET and BNP next wk

## 2018-01-20 ENCOUNTER — Other Ambulatory Visit: Payer: Medicare Other

## 2018-01-20 DIAGNOSIS — I5032 Chronic diastolic (congestive) heart failure: Secondary | ICD-10-CM

## 2018-01-20 LAB — BASIC METABOLIC PANEL
BUN/Creatinine Ratio: 17 (ref 10–24)
BUN: 19 mg/dL (ref 8–27)
CALCIUM: 9.4 mg/dL (ref 8.6–10.2)
CO2: 24 mmol/L (ref 20–29)
CREATININE: 1.13 mg/dL (ref 0.76–1.27)
Chloride: 100 mmol/L (ref 96–106)
GFR, EST AFRICAN AMERICAN: 68 mL/min/{1.73_m2} (ref >59)
GFR, EST NON AFRICAN AMERICAN: 59 mL/min/{1.73_m2} — AB (ref >59)
Glucose: 92 mg/dL (ref 65–99)
POTASSIUM: 4.4 mmol/L (ref 3.5–5.2)
Sodium: 140 mmol/L (ref 134–144)

## 2018-01-20 LAB — PRO B NATRIURETIC PEPTIDE: NT-Pro BNP: 969 pg/mL — ABNORMAL HIGH (ref 0–486)

## 2018-02-02 ENCOUNTER — Ambulatory Visit (INDEPENDENT_AMBULATORY_CARE_PROVIDER_SITE_OTHER): Payer: Medicare Other | Admitting: *Deleted

## 2018-02-02 ENCOUNTER — Ambulatory Visit (INDEPENDENT_AMBULATORY_CARE_PROVIDER_SITE_OTHER): Payer: Medicare Other | Admitting: Internal Medicine

## 2018-02-02 ENCOUNTER — Encounter: Payer: Self-pay | Admitting: Internal Medicine

## 2018-02-02 ENCOUNTER — Telehealth: Payer: Self-pay | Admitting: Cardiology

## 2018-02-02 VITALS — BP 140/72 | HR 71 | Ht 70.0 in | Wt 282.2 lb

## 2018-02-02 DIAGNOSIS — I5032 Chronic diastolic (congestive) heart failure: Secondary | ICD-10-CM | POA: Diagnosis not present

## 2018-02-02 DIAGNOSIS — H01021 Squamous blepharitis right upper eyelid: Secondary | ICD-10-CM | POA: Diagnosis not present

## 2018-02-02 DIAGNOSIS — I25709 Atherosclerosis of coronary artery bypass graft(s), unspecified, with unspecified angina pectoris: Secondary | ICD-10-CM | POA: Diagnosis not present

## 2018-02-02 DIAGNOSIS — I442 Atrioventricular block, complete: Secondary | ICD-10-CM

## 2018-02-02 DIAGNOSIS — H02831 Dermatochalasis of right upper eyelid: Secondary | ICD-10-CM | POA: Diagnosis not present

## 2018-02-02 DIAGNOSIS — H02834 Dermatochalasis of left upper eyelid: Secondary | ICD-10-CM | POA: Diagnosis not present

## 2018-02-02 DIAGNOSIS — I480 Paroxysmal atrial fibrillation: Secondary | ICD-10-CM

## 2018-02-02 DIAGNOSIS — I25810 Atherosclerosis of coronary artery bypass graft(s) without angina pectoris: Secondary | ICD-10-CM | POA: Diagnosis not present

## 2018-02-02 DIAGNOSIS — H01025 Squamous blepharitis left lower eyelid: Secondary | ICD-10-CM | POA: Diagnosis not present

## 2018-02-02 DIAGNOSIS — H01022 Squamous blepharitis right lower eyelid: Secondary | ICD-10-CM | POA: Diagnosis not present

## 2018-02-02 DIAGNOSIS — H35371 Puckering of macula, right eye: Secondary | ICD-10-CM | POA: Diagnosis not present

## 2018-02-02 DIAGNOSIS — R55 Syncope and collapse: Secondary | ICD-10-CM | POA: Diagnosis not present

## 2018-02-02 DIAGNOSIS — Z961 Presence of intraocular lens: Secondary | ICD-10-CM | POA: Diagnosis not present

## 2018-02-02 DIAGNOSIS — H04123 Dry eye syndrome of bilateral lacrimal glands: Secondary | ICD-10-CM | POA: Diagnosis not present

## 2018-02-02 DIAGNOSIS — H01024 Squamous blepharitis left upper eyelid: Secondary | ICD-10-CM | POA: Diagnosis not present

## 2018-02-02 NOTE — Telephone Encounter (Signed)
Spoke with pt and reminded pt of remote transmission that is due today. Pt verbalized understanding.   

## 2018-02-02 NOTE — Patient Instructions (Signed)
Your physician recommends that you continue on your current medications as directed. Please refer to the Current Medication list given to you today. Your physician wants you to follow-up in: 4 months with Dr. Harrington Challenger.  You will receive a reminder letter in the mail two months in advance. If you don't receive a letter, please call our office to schedule the follow-up appointment.  Call or email through [place] with weights and how you are feeling.

## 2018-02-02 NOTE — Progress Notes (Signed)
Cardiology Office Note   Date:  02/02/2018   ID:  Levi [name], DOB [birth year], MRN [id number]  PCP:  Bernerd Limbo, MD  Cardiologist:   [name], MD   F/u of CHF   History of Present Illness: Levi [name] is a 84 y.o. male with history of CAD status post CABG in 2011, DES to the LAD in 2014, PAF( chadsvASC equals 4) permanent pacemaker ([place])   In 10/2016 felt bad  L heart cath recommended  He did not want to have done as wife ill  His wife then passed away.   Went to see Dr. Maylon Peppers at [facility] for second opinion  Lasix increased  He was scheduled for nuclear stress test and PFTs.  And underwent cardiac catheterization 01/2017 [facility] which showed:  3-vessel CAD. LM is diffusely diseased with 50% distal stenosis. LAD has a 80% mid instent restenosis, LCX has serial 50-60% stenoses in the mid segment, OM2 is a small vessel and has a 70% ostial stenosis. RCA has long diffuse 50-60% proximal and 60% distal stenosis. LIMA to LAD is patent with a kink in the mid segment. Flow is normal. LV-Gram reveals EF 50%. LVEDP is 8 mmHg.   Left radial artery was very small and a 70F catheter could not be advanced. Right femoral artery access was obtained for the procedure. Femoral arteriotomy was closed with 70F Angioseal device.  SVG grafts are known to be chronically occluded, so not injected   REcommendations was for medical Rx    Patient went to the ER 08/07/17 with syncope.  He said he was making breakfast when he hit the floor.  When he got to the emergency room he had SVT and atrial flutter.  Pacer rep from [place] checked his pacer and said he had atrial fibrillation prior to his syncope.  He also had orthostatic hypotension.  His amlodipine and Spironolactone were stopped.  He was still dizzy when he got home and blood pressures were running low so losartan was stopped.  I saw the pt in March 2019  His volume was up some on exam    Have worked to manage as outpt   The pt is currently  on 80 mg lasix daily  The pt says he still gets SOB with activities   Better though than it was earlier this year.   He denies CP   Wt is down about 20 lbs    He urinates a lot with lasix   Drinks a lot   WOnders if he can back down on lasix    Current Meds  Medication Sig  . apixaban (ELIQUIS) 5 MG TABS tablet Take 1 tablet (5 mg total) by mouth 2 (two) times daily. To start when instructed after eye surgery  . carvedilol (COREG) 6.25 MG tablet Take 1 tablet (6.25 mg total) by mouth 2 (two) times daily.  . furosemide (LASIX) 40 MG tablet Take 80 mg by mouth daily.  . nitroGLYCERIN (NITROSTAT) 0.4 MG SL tablet Place 0.4 mg under the tongue every 5 (five) minutes x 3 doses as needed for chest pain.  . polyethylene glycol powder (GLYCOLAX/MIRALAX) powder Take 1 Container by mouth daily.   . potassium chloride SA (K-DUR,KLOR-CON) 20 MEQ tablet Take 20 mEq by mouth daily.  . pravastatin (PRAVACHOL) 20 MG tablet TAKE 1 TABLET BY MOUTH  DAILY  . tamsulosin (FLOMAX) 0.4 MG CAPS capsule Take 0.4 mg by mouth daily.     Allergies:   Patient  has no known allergies.   Past Medical History:  Diagnosis Date  . Asthma   . Benign neoplasm of colon   . CAD (coronary artery disease) Dec 2011   s/p CABG per Dr. Roxy Manns; had normal EF; All SVGs occluded per follow up cath with only LIMA to LAD patent; s/p PCI of the proximal and mid LAD February 2014 per Dr. Burt Knack  . Dyspnea on exertion   . Edema   . Emphysema    "never treated for it" (10/05/2015)  . History of hiatal hernia   . HLD (hyperlipidemia)   . HTN (hypertension)   . Malaise and fatigue   . Obesity   . Osteopenia   . Pneumonia 1960s X 1; 1990s X 1  . Presence of permanent cardiac pacemaker   . Prostate cancer ([place])   . Skin cancer    "face, nose, top of ears, scalp"  . Symptomatic bradycardia    STJ PPM, 10/05/15, Dr. Lovena Le    Past Surgical History:  Procedure Laterality Date  . ANTERIOR CERVICAL DECOMP/DISCECTOMY FUSION    . BACK  SURGERY    . CATARACT EXTRACTION Right   . CORONARY ANGIOPLASTY WITH STENT PLACEMENT  10/14/12   with stent to proximal and mid LAD  . CORONARY ARTERY BYPASS GRAFT  08/23/10   "CABG X4"  . EP IMPLANTABLE DEVICE N/A 10/05/2015   Procedure: Pacemaker Implant;  Surgeon: [name], MD;  Location: [place] [facility];  Service: Cardiovascular;  Laterality: N/A;  . EP IMPLANTABLE DEVICE N/A 10/06/2015   Procedure: Lead Revision/Repair;  Surgeon: [name], MD;  Location: [place] [facility];  Service: Cardiovascular;  Laterality: N/A;  . INSERT / REPLACE / REMOVE PACEMAKER  10/05/2015  . LEFT HEART CATHETERIZATION WITH CORONARY/GRAFT ANGIOGRAM N/A 09/01/2012   Procedure: LEFT HEART CATHETERIZATION WITH [name];  Surgeon: [name], MD;  Location: [facility] CATH LAB;  Service: Cardiovascular;  Laterality: N/A;  . MEDIAN STERNOTOMY  08/23/10  . MOHS SURGERY  "@ least twice"  . PACEMAKER INSERTION     STJ 10/05/15  . PERCUTANEOUS CORONARY STENT INTERVENTION (PCI-S) N/A 10/14/2012   Procedure: PERCUTANEOUS CORONARY STENT INTERVENTION (PCI-S);  Surgeon: [name], MD;  Location: [facility] CATH LAB;  Service: Cardiovascular;  Laterality: N/A;  . PROSTATE BIOPSY    . TRANSURETHRAL RESECTION OF PROSTATE       Social History:  The patient  reports that he quit smoking about 42 years ago. His smoking use included cigarettes. He has a 30.00 pack-year smoking history. He quit smokeless tobacco use about 2 years ago. His smokeless tobacco use included chew. He reports that he does not drink alcohol or use drugs.   Family History:  The patient's family history includes Heart attack in his father; Heart disease in his brother; Hypertension in his mother; Kidney cancer in his father; Prostate cancer in his brother; Skin cancer in his brother; Stroke in his mother.    ROS:  Please see the history of present illness. All other systems are reviewed and  Negative to the above problem except as  noted.    PHYSICAL EXAM: VS:  BP 140/72   Pulse 71   Ht 5\' 10"  (1.778 m)   Wt 128 kg (282 lb 3.2 oz)   SpO2 96%   BMI 40.49 kg/m   GEN: Morbidly obese 84 yo in no acute distress  HEENT: normal  Neck: Neck is full    JVP is increased   No carotid  bruits, or masses Cardiac: RRR; no murmurs, rubs, or gallops,Tr to 1+edema  Respiratory:  clear to auscultation bilaterally, normal work of breathing GI: soft, nontender, nondistended, + BS  No hepatomegaly  MS: no deformity Moving all extremities   Skin: warm and dry, no rash Neuro:  Strength and sensation are intact Psych: euthymic mood, full affect   EKG:  EKG is not ordered today.   Lipid Panel    Component Value Date/Time   CHOL 113 06/01/2014 0930   TRIG 115.0 06/01/2014 0930   HDL 32.40 (L) 06/01/2014 0930   CHOLHDL 3 06/01/2014 0930   VLDL 23.0 06/01/2014 0930   LDLCALC 58 06/01/2014 0930   LDLDIRECT 70.7 06/10/2013 1344      Wt Readings from Last 3 Encounters:  02/02/18 128 kg (282 lb 3.2 oz)  10/31/17 131.1 kg (289 lb)  10/31/17 131.5 kg (289 lb 12.8 oz)      ASSESSMENT AND PLAN:  1  Acute on chronic diastolic CHF   Volume is up on exam today but better than he was when I saw him earlier this winter   He appears more energized   Happy     I would keep on current regimen  I have asked pt and daughter to keep track of how much he drinks in a 3 day span   If excessive may be able to limit and than change lasix dosing  Weigh daily   2   CAD   No symtposm to sugg ischemia   He is not a candidate for intervention 3  PAF   Contue anticoagulation  4   Syncope  No recurrence  5   Hx CHB   S/p PPM  6   HL  On statin    I would f/u later this fall      Current medicines are reviewed at length with the patient today.  The patient does not have concerns regarding medicines.  Signed, [name], MD  02/02/2018 10:48 AM    [address], [place], [place]  [address] Phone: [phone]; Fax: [phone]

## 2018-02-03 DIAGNOSIS — I509 Heart failure, unspecified: Secondary | ICD-10-CM | POA: Diagnosis not present

## 2018-02-03 DIAGNOSIS — Z950 Presence of cardiac pacemaker: Secondary | ICD-10-CM | POA: Diagnosis not present

## 2018-02-03 DIAGNOSIS — I480 Paroxysmal atrial fibrillation: Secondary | ICD-10-CM | POA: Diagnosis not present

## 2018-02-03 DIAGNOSIS — J439 Emphysema, unspecified: Secondary | ICD-10-CM | POA: Diagnosis not present

## 2018-02-03 DIAGNOSIS — C61 Malignant neoplasm of prostate: Secondary | ICD-10-CM | POA: Diagnosis not present

## 2018-02-03 DIAGNOSIS — I209 Angina pectoris, unspecified: Secondary | ICD-10-CM | POA: Diagnosis not present

## 2018-02-05 ENCOUNTER — Encounter: Payer: Self-pay | Admitting: Cardiology

## 2018-02-05 NOTE — Progress Notes (Signed)
Remote pacemaker transmission.   

## 2018-02-06 ENCOUNTER — Encounter: Payer: Self-pay | Admitting: Internal Medicine

## 2018-02-19 LAB — CUP PACEART REMOTE DEVICE CHECK
Battery Remaining Longevity: 104 mo
Brady Statistic AP VP Percent: 76 %
Brady Statistic AP VS Percent: 1 %
Brady Statistic AS VP Percent: 23 %
Brady Statistic AS VS Percent: 1 %
Brady Statistic RV Percent Paced: 98 %
Implantable Lead Implant Date: 20170202
Implantable Lead Location: 753860
Lead Channel Impedance Value: 380 Ohm
Lead Channel Impedance Value: 450 Ohm
Lead Channel Pacing Threshold Amplitude: 0.75 V
Lead Channel Pacing Threshold Pulse Width: 0.4 ms
Lead Channel Pacing Threshold Pulse Width: 0.4 ms
Lead Channel Sensing Intrinsic Amplitude: 12 mV
Lead Channel Setting Pacing Amplitude: 1 V
Lead Channel Setting Pacing Amplitude: 2.5 V
Lead Channel Setting Pacing Pulse Width: 0.4 ms
MDC IDC LEAD IMPLANT DT: 20170202
MDC IDC LEAD LOCATION: 753859
MDC IDC MSMT BATTERY REMAINING PERCENTAGE: 95.5 %
MDC IDC MSMT BATTERY VOLTAGE: 2.99 V
MDC IDC MSMT LEADCHNL RA PACING THRESHOLD AMPLITUDE: 1.25 V
MDC IDC MSMT LEADCHNL RA SENSING INTR AMPL: 2.6 mV
MDC IDC PG IMPLANT DT: 20170202
MDC IDC PG SERIAL: 7864186
MDC IDC SESS DTM: 20190605120612
MDC IDC SET LEADCHNL RV SENSING SENSITIVITY: 2 mV
MDC IDC STAT BRADY RA PERCENT PACED: 56 %

## 2018-02-23 ENCOUNTER — Other Ambulatory Visit: Payer: Self-pay

## 2018-02-23 MED ORDER — CARVEDILOL 6.25 MG PO TABS: 6.2500 mg | ORAL_TABLET | Freq: Two times a day (BID) | ORAL | 3 refills | Status: DC

## 2018-02-26 ENCOUNTER — Encounter: Payer: Self-pay | Admitting: Allergy

## 2018-02-26 ENCOUNTER — Ambulatory Visit (INDEPENDENT_AMBULATORY_CARE_PROVIDER_SITE_OTHER): Payer: Medicare Other | Admitting: Allergy

## 2018-02-26 VITALS — BP 136/68 | HR 75 | Temp 98.8°F | Resp 20 | Ht 69.0 in | Wt 280.8 lb

## 2018-02-26 DIAGNOSIS — J320 Chronic maxillary sinusitis: Secondary | ICD-10-CM

## 2018-02-26 MED ORDER — IPRATROPIUM BROMIDE 0.06 % NA SOLN: 2.0000 | Freq: Three times a day (TID) | NASAL | 5 refills | Status: DC

## 2018-02-26 NOTE — Progress Notes (Signed)
New Patient Note  RE: Levi [name] MRN: [id number] DOB: 10-07-1928 Date of Office Visit: 02/26/2018  Referring provider: Bernerd Limbo, MD Primary care provider: Bernerd Limbo, MD  Chief Complaint:  Stopped up  History of present illness: Levi [name] is a 84 y.o. male presenting today for consultation for nasal congestion.    He states he is "stopped up" in his head and has to breathe thru his mouth.  Symptoms worse in the evenings/night.  He also does state he has sinus drainage as well.  He states he has had this congestion for couple of years but this year is worse.  No headaches but does endorse sinus pressure through his cheeks.   He also reports sneezing quite frequently.  No ocular symptoms.   He is on Flonase for the past year.  He states he is using flonase 1 spray each nostril daily which has not helped.  He does not take any antihistamines.     He states he does have an OTC nasal spray that he uses multiple times a day.   He states he has done nasal saline rinses in the past.   He has never had any allergy testing performed.  He states he has no history of asthma, eczema or food allergy.    He has CHF followed by cardiology; sleep apnea followed by sleep medicine on BiPaP.  He states he cleans his BiPaP mask and equipment daily.    Review of systems: Review of Systems  Constitutional: Negative for chills, fever and malaise/fatigue.  HENT: Positive for congestion and sinus pain. Negative for ear discharge, ear pain, nosebleeds and sore throat.   Eyes: Negative for pain, discharge and redness.  Respiratory: Negative for cough, shortness of breath and wheezing.   Cardiovascular: Negative for chest pain.  Gastrointestinal: Negative for abdominal pain, constipation, diarrhea, heartburn, nausea and vomiting.  Musculoskeletal: Negative for joint pain.  Skin: Negative for itching and rash.  Neurological: Negative for headaches.    All other systems negative unless  noted above in HPI  Past medical history: Past Medical History:  Diagnosis Date  . Asthma   . Benign neoplasm of colon   . CAD (coronary artery disease) Dec 2011   s/p CABG per Dr. Roxy Manns; had normal EF; All SVGs occluded per follow up cath with only LIMA to LAD patent; s/p PCI of the proximal and mid LAD February 2014 per Dr. Burt Knack  . Dyspnea on exertion   . Edema   . Emphysema    "never treated for it" (10/05/2015)  . History of hiatal hernia   . HLD (hyperlipidemia)   . HTN (hypertension)   . Malaise and fatigue   . Obesity   . Osteopenia   . Pneumonia 1960s X 1; 1990s X 1  . Presence of permanent cardiac pacemaker   . Prostate cancer ([place])   . Skin cancer    "face, nose, top of ears, scalp"  . Symptomatic bradycardia    STJ PPM, 10/05/15, Dr. Lovena Le    Past surgical history: Past Surgical History:  Procedure Laterality Date  . ANTERIOR CERVICAL DECOMP/DISCECTOMY FUSION    . BACK SURGERY    . CATARACT EXTRACTION Right   . CORONARY ANGIOPLASTY WITH STENT PLACEMENT  10/14/12   with stent to proximal and mid LAD  . CORONARY ARTERY BYPASS GRAFT  08/23/10   "CABG X4"  . EP IMPLANTABLE DEVICE N/A 10/05/2015   Procedure: Pacemaker Implant;  Surgeon: [name], MD;  Location: [place] [facility];  Service: Cardiovascular;  Laterality: N/A;  . EP IMPLANTABLE DEVICE N/A 10/06/2015   Procedure: Lead Revision/Repair;  Surgeon: [name], MD;  Location: [place] [facility];  Service: Cardiovascular;  Laterality: N/A;  . INSERT / REPLACE / REMOVE PACEMAKER  10/05/2015  . LEFT HEART CATHETERIZATION WITH CORONARY/GRAFT ANGIOGRAM N/A 09/01/2012   Procedure: LEFT HEART CATHETERIZATION WITH [name];  Surgeon: [name], MD;  Location[facility] LAB;  Service: Cardiovascular;  Laterality: N/A;  . MEDIAN STERNOTOMY  08/23/10  . MOHS SURGERY  "@ least twice"  . PACEMAKER INSERTION     STJ 10/05/15  . PERCUTANEOUS CORONARY STENT INTERVENTION (PCI-S) N/A 10/14/2012    Procedure: PERCUTANEOUS CORONARY STENT INTERVENTION (PCI-S);  Surgeon: [name], MD;  Location: [facility] CATH LAB;  Service: Cardiovascular;  Laterality: N/A;  . PROSTATE BIOPSY    . TRANSURETHRAL RESECTION OF PROSTATE      Family history:  Family History  Problem Relation Age of Onset  . Kidney cancer Father   . Heart attack Father   . Hypertension Mother   . Stroke Mother   . Heart disease Brother   . Skin cancer Brother   . Prostate cancer Brother   . Allergic rhinitis Neg Hx   . Asthma Neg Hx   . Eczema Neg Hx   . Urticaria Neg Hx     Social history: He lives in a home without carpeting with oil heating and central cooling.  No pets in the home.  No concern for water damage, mildew or roaches in the home.  He sleeps in a recliner.  He is retired. Occupational History  . Occupation: retired Information systems manager: RETIRED  Tobacco Use  . Smoking status: Former Smoker    Packs/day: 1.00    Years: 30.00    Pack years: 30.00    Types: Cigarettes    Last attempt to quit: 09/07/1975    Years since quitting: 42.5  . Smokeless tobacco: Former Systems developer    Types: Chew    Quit date: 07/27/2015    Medication List: Allergies as of 02/26/2018   No Known Allergies     Medication List        Accurate as of 02/26/18  5:14 PM. Always use your most recent med list.          apixaban 5 MG Tabs tablet Commonly known as:  ELIQUIS Take 1 tablet (5 mg total) by mouth 2 (two) times daily. To start when instructed after eye surgery   carvedilol 6.25 MG tablet Commonly known as:  COREG Take 1 tablet (6.25 mg total) by mouth 2 (two) times daily.   furosemide 40 MG tablet Commonly known as:  LASIX Take 80 mg by mouth daily.   nitroGLYCERIN 0.4 MG SL tablet Commonly known as:  NITROSTAT Place 0.4 mg under the tongue every 5 (five) minutes x 3 doses as needed for chest pain.   polyethylene glycol powder powder Commonly known as:  GLYCOLAX/MIRALAX Take 1 Container by mouth daily.     potassium chloride SA 20 MEQ tablet Commonly known as:  K-DUR,KLOR-CON Take 20 mEq by mouth daily.   pravastatin 20 MG tablet Commonly known as:  PRAVACHOL TAKE 1 TABLET BY MOUTH  DAILY   tamsulosin 0.4 MG Caps capsule Commonly known as:  FLOMAX Take 0.4 mg by mouth daily.       Known medication allergies: No Known Allergies   Physical examination: Blood pressure 136/68,  pulse 75, temperature 98.8 F (37.1 C), temperature source Oral, resp. rate 20, height 5\' 9"  (1.753 m), weight 280 lb 12.8 oz (127.4 kg), SpO2 93 %.  General: Alert, interactive, in no acute distress. HEENT: PERRLA, TMs pearly gray, turbinates markedly edematous with clear discharge of the right nares with complete obstruction, left nares is open with moderate turbinate hypertrophy, post-pharynx non erythematous. Neck: Supple without lymphadenopathy. Lungs: Clear to auscultation without wheezing, rhonchi or rales. {no increased work of breathing. CV: Normal S1, S2 without murmurs. Abdomen: Nondistended, nontender. Skin: Warm and dry, without lesions or rashes. Extremities:  No clubbing, cyanosis or edema. Neuro:   Grossly intact.  Diagnositics/Labs: None today  Assessment and plan:   Chronic nasal congestion      - you have complete obstruction of right nostril with visible mucus drainage.  It is also likely that he may have a polyp however with the degree of turbinate hypertrophy it was difficult to decipher between polyp versus mucus     - recommend use OTC Afrin 2 sprays daily into right nostril to help open up the nose so you can breathe better.  Do not use Afrin more than 3 days in a row as can cause worsening congestion upon stopping if used long-term.  Wait about 5-15 minutes or until breathing more freely and then you maintenance nasal sprays     - Once you are able to breathe more freely from the nose then use your nasal steroid spray like Flonase or Nasacort also OTC 2 sprays each nostril daily.   Use for 1-2 weeks at a time before stopping once symptoms improve     - for nasal drainage/drip recommend use of nasal Atrovent 2 sprays each nostril which can be used 3-4 times a day if needed     - will obtain environmental allergy profile today and will call you with these results.    Follow-up 3-4 months or sooner if needed  I appreciate the opportunity to take part in [place] care. Please do not hesitate to contact me with questions.  Sincerely,   Prudy Feeler, MD Allergy/Immunology [facility]

## 2018-02-26 NOTE — Patient Instructions (Addendum)
Chronic nasal congestion      - you have complete obstruction of right nostril with visible mucus drainage     - recommend you use OTC Afrin 2 sprays dailly into right nostril to help open up the nose so you can breathe better.  Do not use Afrin more than 3 days in a row as can cause worsening congestion upon stopping if used long-term.  Wait about 5-15 minutes or until breathing more freely and then you maintenance nasal sprays     - Once you are able to breathe more freely from the nose then use your nasal steroid spray like Flonase or Nasacort also OTC 2 sprays each nostril daily.  Use for 1-2 weeks at a time before stopping once symptoms improve     - for nasal drainage/drip recommend use of nasal Atrovent 2 sprays each nostril which can be used 3-4 times a day if needed     - will obtain environmental allergy profile today and will call you with these results.    Follow-up 3-4 months or sooner if needed

## 2018-03-02 ENCOUNTER — Telehealth: Payer: Self-pay | Admitting: Internal Medicine

## 2018-03-02 DIAGNOSIS — I5032 Chronic diastolic (congestive) heart failure: Secondary | ICD-10-CM

## 2018-03-02 NOTE — Telephone Encounter (Signed)
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

## 2018-03-02 NOTE — Telephone Encounter (Signed)
I spoke with pt's daughter Roni Bread).  She reports her sister in law in an Therapist, sports and told her to call office to let Dr. Harrington Challenger pt may have possible perpetual (daughter spelled this for me) vascular disease.  He has 2-3 plus pitting edema with redness/puplish discoloration on bilateral calf from knee to ankle in front. Daughter reports discoloration started the end of last week.  This is not painful to touch or when walking. Daughter reports it looks like "blood vessels burst under skin."  It is solid not scattered. Daughter reports right calf is bigger than left and right ankle appears to be more swollen than left.  Daughter states pt's shortness of breath is the same as usual.  Edema is worse. He is taking Lasix 40 mg alternating with 80 mg every other day.

## 2018-03-03 LAB — ALLERGENS W/TOTAL IGE AREA 2
Aspergillus Fumigatus IgE: <0.1 kU/L
Bermuda Grass IgE: <0.1 kU/L
Cat Dander IgE: <0.1 kU/L
Cedar, Mountain IgE: <0.1 kU/L
Common Silver Birch IgE: <0.1 kU/L
D Pteronyssinus IgE: <0.1 kU/L
Dog Dander IgE: <0.1 kU/L
Elm, American IgE: <0.1 kU/L
IgE (Immunoglobulin E), Serum: 64 IU/mL (ref 6–495)
Mouse Urine IgE: <0.1 kU/L
Ragweed, Short IgE: <0.1 kU/L
Sheep Sorrel IgE Qn: <0.1 kU/L

## 2018-03-04 ENCOUNTER — Other Ambulatory Visit: Payer: Medicare Other | Admitting: *Deleted

## 2018-03-04 DIAGNOSIS — I5032 Chronic diastolic (congestive) heart failure: Secondary | ICD-10-CM | POA: Diagnosis not present

## 2018-03-04 NOTE — Telephone Encounter (Signed)
I spoke with pt's daughter [name]).  She reports pt increased Lasix to 80 mg daily on Monday.  She reports pt had been on this dose at last office visit but then decreased on his own to 80 mg alternating with 40 mg.  Daughter does not know if edema has decreased as she is not currently with pt.  Daughter will have pt come in for lab work today.

## 2018-03-04 NOTE — Telephone Encounter (Signed)
Set up for BMET today and BNP    May need to increase to 80 daily or add another med

## 2018-03-04 NOTE — Addendum Note (Signed)
Addended by: Thompson Grayer on: 03/04/2018 09:40 AM   Modules accepted: Orders

## 2018-03-05 LAB — PRO B NATRIURETIC PEPTIDE: NT-Pro BNP: 1969 pg/mL — ABNORMAL HIGH (ref 0–486)

## 2018-03-05 LAB — BASIC METABOLIC PANEL
BUN/Creatinine Ratio: 17 (ref 10–24)
BUN: 17 mg/dL (ref 8–27)
CALCIUM: 8.9 mg/dL (ref 8.6–10.2)
CO2: 22 mmol/L (ref 20–29)
Chloride: 100 mmol/L (ref 96–106)
Creatinine, Ser: 0.98 mg/dL (ref 0.76–1.27)
GFR calc Af Amer: 80 mL/min/{1.73_m2} (ref >59)
GFR calc non Af Amer: 69 mL/min/{1.73_m2} (ref >59)
Glucose: 108 mg/dL — ABNORMAL HIGH (ref 65–99)
POTASSIUM: 3.8 mmol/L (ref 3.5–5.2)
SODIUM: 139 mmol/L (ref 134–144)

## 2018-03-09 ENCOUNTER — Telehealth: Payer: Self-pay | Admitting: Internal Medicine

## 2018-03-09 NOTE — Telephone Encounter (Signed)
[name] pt's daughter calling   Has a question concerning pt's potassium prescription. Please call daughter [name]

## 2018-03-10 ENCOUNTER — Encounter: Payer: Self-pay | Admitting: Internal Medicine

## 2018-03-10 MED ORDER — POTASSIUM CHLORIDE CRYS ER 20 MEQ PO TBCR: 20.0000 meq | EXTENDED_RELEASE_TABLET | Freq: Every day | ORAL | 3 refills | Status: DC

## 2018-03-10 NOTE — Telephone Encounter (Signed)
Tried to call patient's daughter back, no answer. Received patient advice request with her concerns. Sent refill of potassium to [facility].

## 2018-04-03 ENCOUNTER — Other Ambulatory Visit: Payer: Self-pay | Admitting: Internal Medicine

## 2018-04-03 ENCOUNTER — Telehealth: Payer: Self-pay

## 2018-04-03 NOTE — Telephone Encounter (Signed)
LMOVM reminding pt to send remote transmission.   

## 2018-04-28 ENCOUNTER — Telehealth: Payer: Self-pay | Admitting: Allergy

## 2018-04-28 NOTE — Telephone Encounter (Signed)
Attempted to call patient at number x 2 provided keep getting busy tone

## 2018-04-28 NOTE — Telephone Encounter (Signed)
Patient is having issues with allergies - can he be seen or can medications be sent in for him to the pharmacy on file Patient was just seen in June and has a follow up appt in oct Please call patient to answer any questions

## 2018-04-30 NOTE — Telephone Encounter (Signed)
I spoke with [name], patient's daughter, and she will speak with him about how he is doing and let us know. She said that he does have another appointment tomorrow, but if needed will make OV with [place] to follow up in regards to his Allergies.

## 2018-05-01 DIAGNOSIS — G4733 Obstructive sleep apnea (adult) (pediatric): Secondary | ICD-10-CM | POA: Diagnosis not present

## 2018-05-01 DIAGNOSIS — Z6841 Body Mass Index (BMI) 40.0 and over, adult: Secondary | ICD-10-CM | POA: Diagnosis not present

## 2018-05-01 DIAGNOSIS — J302 Other seasonal allergic rhinitis: Secondary | ICD-10-CM | POA: Diagnosis not present

## 2018-05-01 DIAGNOSIS — I509 Heart failure, unspecified: Secondary | ICD-10-CM | POA: Diagnosis not present

## 2018-05-01 DIAGNOSIS — I4891 Unspecified atrial fibrillation: Secondary | ICD-10-CM | POA: Diagnosis not present

## 2018-05-01 DIAGNOSIS — Z951 Presence of aortocoronary bypass graft: Secondary | ICD-10-CM | POA: Diagnosis not present

## 2018-05-01 DIAGNOSIS — Z9989 Dependence on other enabling machines and devices: Secondary | ICD-10-CM | POA: Diagnosis not present

## 2018-05-01 DIAGNOSIS — Z87891 Personal history of nicotine dependence: Secondary | ICD-10-CM | POA: Diagnosis not present

## 2018-05-01 DIAGNOSIS — I5020 Unspecified systolic (congestive) heart failure: Secondary | ICD-10-CM | POA: Diagnosis not present

## 2018-05-01 DIAGNOSIS — I110 Hypertensive heart disease with heart failure: Secondary | ICD-10-CM | POA: Diagnosis not present

## 2018-05-01 DIAGNOSIS — E669 Obesity, unspecified: Secondary | ICD-10-CM | POA: Diagnosis not present

## 2018-05-05 ENCOUNTER — Ambulatory Visit (INDEPENDENT_AMBULATORY_CARE_PROVIDER_SITE_OTHER): Payer: Medicare Other | Admitting: *Deleted

## 2018-05-05 ENCOUNTER — Telehealth: Payer: Self-pay | Admitting: Cardiology

## 2018-05-05 DIAGNOSIS — I442 Atrioventricular block, complete: Secondary | ICD-10-CM

## 2018-05-05 NOTE — Telephone Encounter (Signed)
LMOVM reminding pt to send remote transmission.   

## 2018-05-06 ENCOUNTER — Telehealth: Payer: Self-pay | Admitting: Internal Medicine

## 2018-05-06 NOTE — Telephone Encounter (Signed)
Spoke w/ pt and instructed him how to send a manual transmission with his home monitor. After one unsuccessful attempt I instructed pt to call tech support. Pt verbalized understanding.

## 2018-05-06 NOTE — Telephone Encounter (Signed)
LMTCB//sss 

## 2018-05-06 NOTE — Telephone Encounter (Signed)
Patient daughter called and asked if the remote could be rescheduled to one day when pt son is available to help patient send remote transmission. I informed pt daughter that this is ok to just let us know what day and time would work best for them. Pt daughter verbalized understanding.

## 2018-05-06 NOTE — Telephone Encounter (Signed)
° ° ° °  Patient's daughter calling with concerns about scheduling report checks.    1. Has your device fired? no  2. Is you device beeping? no  3. Are you experiencing draining or swelling at device site? no  4. Are you calling to see if we received your device transmission? no  5. Have you passed out? no    Please route to [place]

## 2018-05-08 ENCOUNTER — Encounter: Payer: Self-pay | Admitting: Cardiology

## 2018-05-08 NOTE — Progress Notes (Signed)
Remote pacemaker transmission.   

## 2018-05-09 ENCOUNTER — Other Ambulatory Visit: Payer: Self-pay | Admitting: Internal Medicine

## 2018-05-11 DIAGNOSIS — H02831 Dermatochalasis of right upper eyelid: Secondary | ICD-10-CM | POA: Diagnosis not present

## 2018-05-11 DIAGNOSIS — H01021 Squamous blepharitis right upper eyelid: Secondary | ICD-10-CM | POA: Diagnosis not present

## 2018-05-11 DIAGNOSIS — H01024 Squamous blepharitis left upper eyelid: Secondary | ICD-10-CM | POA: Diagnosis not present

## 2018-05-11 DIAGNOSIS — H539 Unspecified visual disturbance: Secondary | ICD-10-CM | POA: Diagnosis not present

## 2018-05-11 DIAGNOSIS — H02834 Dermatochalasis of left upper eyelid: Secondary | ICD-10-CM | POA: Diagnosis not present

## 2018-05-11 DIAGNOSIS — H04123 Dry eye syndrome of bilateral lacrimal glands: Secondary | ICD-10-CM | POA: Diagnosis not present

## 2018-05-11 DIAGNOSIS — Z961 Presence of intraocular lens: Secondary | ICD-10-CM | POA: Diagnosis not present

## 2018-05-11 DIAGNOSIS — H01022 Squamous blepharitis right lower eyelid: Secondary | ICD-10-CM | POA: Diagnosis not present

## 2018-05-11 DIAGNOSIS — M316 Other giant cell arteritis: Secondary | ICD-10-CM | POA: Diagnosis not present

## 2018-05-11 DIAGNOSIS — H01025 Squamous blepharitis left lower eyelid: Secondary | ICD-10-CM | POA: Diagnosis not present

## 2018-05-11 NOTE — Telephone Encounter (Signed)
Discharge Medications: Allergies as of 08/07/2017   No Known Allergies             Medication List     STOP taking these medications   amLODipine 2.5 MG tablet Commonly known as:  NORVASC   spironolactone 25 MG tablet Commonly known as:  ALDACTONE

## 2018-05-12 NOTE — Progress Notes (Signed)
[facility] Note  05/13/2018     CHIEF COMPLAINT Patient presents for Retina Evaluation   HISTORY OF PRESENT ILLNESS: Levi [name] is a 84 y.o. male who presents to the clinic today for:   HPI    Retina Evaluation    In both eyes.  This started 1 month ago.  Associated Symptoms Negative for Flashes, Blind Spot, Photophobia, Scalp Tenderness, Fever, Weight Loss, Jaw Claudication, Glare, Pain, Floaters, Distortion, Redness, Trauma, Shoulder/Hip pain and Fatigue.  Context:  distance vision, mid-range vision and near vision.  Treatments tried include surgery and laser.  Response to treatment was significant improvement.  I, the attending physician,  performed the HPI with the patient and updated documentation appropriately.          Comments    Referral of Dr. Zenia Resides for retina eval. Patient states his vision has been blurry for the past month, denies flashes, floaters and ocular pain. Pt reports he had cataract sx many years ago OD, 3-4 yrs ago OS with significant improvement and eye lid sx appx 3 months ago. Patient  is on Eliquis. Denies gtt's/vit's       Last edited by Bernarda Caffey, MD on 05/13/2018  8:59 AM. (History)    Pt states he sees Dr. Katy Fitch for routine care; Pt states he began to have VA loss x 1 month ago; Pt states he was "20/20 a month ago"; Pt states he first noticed blurred VA when driving; Pt states he has a pacemaker due to being short of breath; Pt endorses being on blood thinner; Pt reports having cataract sx by Dr. Elliot Dally;   Referring physician: Debbra Riding, MD [address] [place], [place] [address]  HISTORICAL INFORMATION:   Selected notes from the MEDICAL RECORD NUMBER Referred by Dr. Wyatt Portela for concern of unexplained vision loss OS LEE: 09.10.19 (S. Groat) [BCVA: OD: 20/20 OS: 20/30] Ocular Hx-pseudo OU, YAG OU dermatochalasis sx, blepharitis OU, DES PMH-asthma, CAD, emphysema, HLD, HTN, skin cancer,  pacemaker    CURRENT MEDICATIONS: No current outpatient medications on file. (Ophthalmic Drugs)   No current facility-administered medications for this visit.  (Ophthalmic Drugs)   Current Outpatient Medications (Other)  Medication Sig  . apixaban (ELIQUIS) 5 MG TABS tablet Take 1 tablet (5 mg total) by mouth 2 (two) times daily. To start when instructed after eye surgery  . carvedilol (COREG) 6.25 MG tablet Take 1 tablet (6.25 mg total) by mouth 2 (two) times daily.  . furosemide (LASIX) 40 MG tablet Take 80 mg by mouth daily.  Marland Kitchen ipratropium (ATROVENT) 0.06 % nasal spray Place 2 sprays into both nostrils 3 (three) times daily.  . nitroGLYCERIN (NITROSTAT) 0.4 MG SL tablet Place 0.4 mg under the tongue every 5 (five) minutes x 3 doses as needed for chest pain.  . polyethylene glycol powder (GLYCOLAX/MIRALAX) powder Take 1 Container by mouth daily.   . potassium chloride SA (K-DUR,KLOR-CON) 20 MEQ tablet Take 1 tablet (20 mEq total) by mouth daily.  . pravastatin (PRAVACHOL) 20 MG tablet TAKE 1 TABLET BY MOUTH  DAILY  . tamsulosin (FLOMAX) 0.4 MG CAPS capsule Take 0.4 mg by mouth daily.   No current facility-administered medications for this visit.  (Other)      REVIEW OF SYSTEMS: ROS    Positive for: Cardiovascular, Eyes   Negative for: Constitutional, Gastrointestinal, Neurological, Skin, Genitourinary, Musculoskeletal, HENT, Endocrine, Respiratory, Psychiatric, Allergic/Imm, Heme/Lymph   Last edited by Elie Goody,  Glenda, LPN on 2/53/6644  0:34 AM. (History)       ALLERGIES No Known Allergies  PAST MEDICAL HISTORY Past Medical History:  Diagnosis Date  . Asthma   . Benign neoplasm of colon   . CAD (coronary artery disease) Dec 2011   s/p CABG per Dr. Roxy Manns; had normal EF; All SVGs occluded per follow up cath with only LIMA to LAD patent; s/p PCI of the proximal and mid LAD February 2014 per Dr. Burt Knack  . Dyspnea on exertion   . Edema   . Emphysema    "never treated for  it" (10/05/2015)  . History of hiatal hernia   . HLD (hyperlipidemia)   . HTN (hypertension)   . Malaise and fatigue   . Obesity   . Osteopenia   . Pneumonia 1960s X 1; 1990s X 1  . Presence of permanent cardiac pacemaker   . Prostate cancer ([place])   . Skin cancer    "face, nose, top of ears, scalp"  . Symptomatic bradycardia    STJ PPM, 10/05/15, Dr. Lovena Le   Past Surgical History:  Procedure Laterality Date  . ANTERIOR CERVICAL DECOMP/DISCECTOMY FUSION    . BACK SURGERY    . C-EYE SURGERY PROCEDURE    . CATARACT EXTRACTION Right   . CORONARY ANGIOPLASTY WITH STENT PLACEMENT  10/14/12   with stent to proximal and mid LAD  . CORONARY ARTERY BYPASS GRAFT  08/23/10   "CABG X4"  . EP IMPLANTABLE DEVICE N/A 10/05/2015   Procedure: Pacemaker Implant;  Surgeon: [name], MD;  Location: [facility];  Service: Cardiovascular;  Laterality: N/A;  . EP IMPLANTABLE DEVICE N/A 10/06/2015   Procedure: Lead Revision/Repair;  Surgeon: [name], MD;  Location: [facility];  Service: Cardiovascular;  Laterality: N/A;  . EYE SURGERY    . INSERT / REPLACE / REMOVE PACEMAKER  10/05/2015  . LEFT HEART CATHETERIZATION WITH CORONARY/GRAFT ANGIOGRAM N/A 09/01/2012   Procedure: LEFT HEART CATHETERIZATION WITH [name];  Surgeon: [name], MD;  Location: [facility];  Service: Cardiovascular;  Laterality: N/A;  . MEDIAN STERNOTOMY  08/23/10  . MOHS SURGERY  "@ least twice"  . PACEMAKER INSERTION     STJ 10/05/15  . PERCUTANEOUS CORONARY STENT INTERVENTION (PCI-S) N/A 10/14/2012   Procedure: PERCUTANEOUS CORONARY STENT INTERVENTION (PCI-S);  Surgeon: [name], MD;  Location: [facility] C[facility] LAB;  Service: Cardiovascular;  Laterality: N/A;  . PROSTATE BIOPSY    . TRANSURETHRAL RESECTION OF PROSTATE      FAMILY HISTORY Family History  Problem Relation Age of Onset  . Kidney cancer Father   . Heart attack Father   . Hypertension Mother   . Stroke Mother   .  Heart disease Brother   . Skin cancer Brother   . Prostate cancer Brother   . Allergic rhinitis Neg Hx   . Asthma Neg Hx   . Eczema Neg Hx   . Urticaria Neg Hx     SOCIAL HISTORY Social History   Tobacco Use  . Smoking status: Former Smoker    Packs/day: 1.00    Years: 30.00    Pack years: 30.00    Types: Cigarettes    Last attempt to quit: 09/07/1975    Years since quitting: 42.7  . Smokeless tobacco: Former Systems developer    Types: Chew    Quit date: 07/27/2015  Substance Use Topics  . Alcohol use: No    Alcohol/week: 0.0 standard drinks  .  Drug use: No         OPHTHALMIC EXAM:  Base Eye Exam    Visual Acuity (Snellen - Linear)      Right Left   Dist cc 20/20 20/60   Dist ph cc  NI   Correction:  Glasses       Tonometry (Tonopen, 8:18 AM)      Right Left   Pressure 14 17       Pupils      Dark Light Shape React APD   Right 3 2 Round Brisk None   Left 3 2 Round Brisk None       Visual Fields (Counting fingers)      Left Right    Full Full       Extraocular Movement      Right Left    Full, Ortho Full, Ortho       Neuro/Psych    Oriented x3:  Yes   Mood/Affect:  Normal       Dilation    Both eyes:  1.0% Mydriacyl, 2.5% Phenylephrine @ 8:18 AM        Slit Lamp and Fundus Exam    Slit Lamp Exam      Right Left   Lids/Lashes Dermatochalasis - upper lid, Meibomian gland dysfunction, Scurf Dermatochalasis - upper lid, Meibomian gland dysfunction, Scurf   Conjunctiva/Sclera White and quiet White and quiet   Cornea Arcus, Temporal Well healed cataract wounds 3+ Punctate epithelial erosions, irregular epi, Decreased TBUT, patch of Neovascularization at 0730, irregular tear film   Anterior Chamber Deep and quiet Deep and quiet   Iris Irregular pupil, dilated to 44mm, Transillumination defects at 0900, Iris atrophy at 0900 Round and dilated to 2mm   Lens PC IOL in good position PC IOL in good position   Vitreous Vitreous syneresis Vitreous syneresis        Fundus Exam      Right Left   Disc Sharp rim, Temporal Peripapillary atrophy Sharp rim, Temporal Peripapillary atrophy   C/D Ratio 0.5 0.5   Macula Blunted foveal reflex, Retinal pigment epithelial mottling, No heme or edema Blunted foveal reflex, Retinal pigment epithelial mottling, No heme or edema   Vessels Copper wiring Copper wiring, AV crossing changes   Periphery Attached, mild Reticular degeneration, pigmented Chorioretinal scar at 1000, dot hemes temporally Attached, round area of chorioretinal atrophy at 0300, IT cobblestoning         Refraction    Wearing Rx      Sphere Cylinder Axis Add   Right -0.25 +1.00 135 +2.50   Left [place] +1.00 015 +2.50       Manifest Refraction      Sphere Cylinder Axis Dist VA   Right       Left +0.50 +1.00 015 20/60       Manifest Refraction #2      Sphere Cylinder Axis Dist VA   Right -0.75 +1.00 135 20/15   Left +0.50 +2.00 180 20/50+1       Manifest Refraction Comments   MRx 2 done by [facility]          IMAGING AND PROCEDURES  Imaging and Procedures for @TODAY @  OCT, Retina - OU - Both Eyes       Right Eye Quality was good. Central Foveal Thickness: 271. Progression has no prior data. Findings include normal foveal contour, no IRF, no SRF (PVD).   Left Eye Quality was good. Central Foveal Thickness: 273. Progression has no  prior data. Findings include normal foveal contour, no IRF, no SRF (Trace ERM).   Notes *Images captured and stored on drive  Diagnosis / Impression:  NFP, No IRF/SRF OU  Clinical management:  See below  Abbreviations: NFP - Normal foveal profile. CME - cystoid macular edema. PED - pigment epithelial detachment. IRF - intraretinal fluid. SRF - subretinal fluid. EZ - ellipsoid zone. ERM - epiretinal membrane. ORA - outer retinal atrophy. ORT - outer retinal tubulation. SRHM - subretinal hyper-reflective material         Fluorescein Angiography Optos (Transit OS)       Right Eye   Progression  has no prior data. Early phase findings include staining. Mid/Late phase findings include staining (? Perifoveal leakage vs staining).   Left Eye   Progression has no prior data. Early phase findings include staining. Mid/Late phase findings include staining.   Notes Images captured and stored on drive;   Impression: OD: normal study early; Late, mild perifoveal hyperfluorescence (leakage vs staining)  OS: normal filling time, staining of disc and areas of peripheral CR scarring, no leakage                  ASSESSMENT/PLAN:    ICD-10-CM   1. Decreased vision of left eye H54.62   2. Dry eyes H04.123   3. Retinal edema H35.81 OCT, Retina - OU - Both Eyes  4. Essential hypertension I10   5. Hypertensive retinopathy of both eyes H35.033 Fluorescein Angiography Optos (Transit OS)  6. Pseudophakia of both eyes Z96.1     1,2. Decreased vision OS-  - was 20/20 OS several months ago, today BCVA 20/50 - no retinal pathology on exam or imaging to correlate decreased vision (FA without significant pathology) - suspect symptoms likely due to corneal dryness vs mild optic neuropathy -- on exam, OS much drier than OD - recommend artificial tears OU QID and artifical tear gel OU qhs - F/U as scheduled with Dr. Katy Fitch -- continue inflammatory work up  3. No retinal edema on exam or OCT  4,5. Hypertensive retinopathy OU - discussed importance of tight BP control - monitor  4. Pseudophakia OU  - s/p CE/IOL  - beautiful surgeries, doing well  - monitor   Ophthalmic Meds Ordered this visit:  No orders of the defined types were placed in this encounter.      Return if symptoms worsen or fail to improve.  There are no Patient Instructions on file for this visit.   Explained the diagnoses, plan, and follow up with the patient and they expressed understanding.  Patient expressed understanding of the importance of proper follow up care.   This document serves as a record of  services personally performed by Gardiner Sleeper, MD, PhD. It was created on their behalf by Ernest Mallick, OA, an ophthalmic assistant. The creation of this record is the provider's dictation and/or activities during the visit.    Electronically signed by: Ernest Mallick, OA  09.10.19 12:43 AM   This document serves as a record of services personally performed by Gardiner Sleeper, MD, PhD. It was created on their behalf by Catha Brow, [place], a certified ophthalmic assistant. The creation of this record is the provider's dictation and/or activities during the visit.  Electronically signed by: Catha Brow, COA 09.11.19 12:43 AM   Gardiner Sleeper, M.D., Ph.D. Diseases & Surgery of the Retina and Vitreous [facility]  I have reviewed the above documentation for accuracy  and completeness, and I agree with the above. Gardiner Sleeper, M.D., Ph.D. 05/14/18 12:43 AM     Abbreviations: M myopia (nearsighted); A astigmatism; H hyperopia (farsighted); P presbyopia; Mrx spectacle prescription;  CTL contact lenses; OD right eye; OS left eye; OU both eyes  XT exotropia; ET esotropia; PEK punctate epithelial keratitis; PEE punctate epithelial erosions; DES dry eye syndrome; MGD meibomian gland dysfunction; ATs artificial tears; PFAT's preservative free artificial tears; [place] nuclear sclerotic cataract; PSC posterior subcapsular cataract; ERM epi-retinal membrane; PVD posterior vitreous detachment; RD retinal detachment; DM diabetes mellitus; DR diabetic retinopathy; NPDR non-proliferative diabetic retinopathy; PDR proliferative diabetic retinopathy; CSME clinically significant macular edema; DME diabetic macular edema; dbh dot blot hemorrhages; CWS cotton wool spot; POAG primary open angle glaucoma; C/D cup-to-disc ratio; HVF humphrey visual field; GVF goldmann visual field; OCT optical coherence tomography; IOP intraocular pressure; BRVO Branch retinal vein occlusion; CRVO central  retinal vein occlusion; CRAO central retinal artery occlusion; BRAO branch retinal artery occlusion; RT retinal tear; SB scleral buckle; PPV pars plana vitrectomy; VH Vitreous hemorrhage; PRP panretinal laser photocoagulation; IVK intravitreal kenalog; VMT vitreomacular traction; MH Macular hole;  NVD neovascularization of the disc; NVE neovascularization elsewhere; AREDS age related eye disease study; ARMD age related macular degeneration; POAG primary open angle glaucoma; EBMD epithelial/anterior basement membrane dystrophy; ACIOL anterior chamber intraocular lens; IOL intraocular lens; PCIOL posterior chamber intraocular lens; Phaco/IOL phacoemulsification with intraocular lens placement; Vincent photorefractive keratectomy; LASIK laser assisted in situ keratomileusis; HTN hypertension; DM diabetes mellitus; COPD chronic obstructive pulmonary disease

## 2018-05-13 ENCOUNTER — Encounter (INDEPENDENT_AMBULATORY_CARE_PROVIDER_SITE_OTHER): Payer: Self-pay | Admitting: Ophthalmology

## 2018-05-13 ENCOUNTER — Ambulatory Visit (INDEPENDENT_AMBULATORY_CARE_PROVIDER_SITE_OTHER): Payer: Medicare Other | Admitting: Ophthalmology

## 2018-05-13 DIAGNOSIS — I10 Essential (primary) hypertension: Secondary | ICD-10-CM | POA: Diagnosis not present

## 2018-05-13 DIAGNOSIS — H5462 Unqualified visual loss, left eye, normal vision right eye: Secondary | ICD-10-CM | POA: Diagnosis not present

## 2018-05-13 DIAGNOSIS — H35033 Hypertensive retinopathy, bilateral: Secondary | ICD-10-CM | POA: Diagnosis not present

## 2018-05-13 DIAGNOSIS — Z961 Presence of intraocular lens: Secondary | ICD-10-CM

## 2018-05-13 DIAGNOSIS — H3581 Retinal edema: Secondary | ICD-10-CM

## 2018-05-13 DIAGNOSIS — H04123 Dry eye syndrome of bilateral lacrimal glands: Secondary | ICD-10-CM | POA: Diagnosis not present

## 2018-05-14 ENCOUNTER — Encounter (INDEPENDENT_AMBULATORY_CARE_PROVIDER_SITE_OTHER): Payer: Self-pay | Admitting: Ophthalmology

## 2018-05-25 DIAGNOSIS — H02834 Dermatochalasis of left upper eyelid: Secondary | ICD-10-CM | POA: Diagnosis not present

## 2018-05-25 DIAGNOSIS — H01025 Squamous blepharitis left lower eyelid: Secondary | ICD-10-CM | POA: Diagnosis not present

## 2018-05-25 DIAGNOSIS — Z961 Presence of intraocular lens: Secondary | ICD-10-CM | POA: Diagnosis not present

## 2018-05-25 DIAGNOSIS — H01024 Squamous blepharitis left upper eyelid: Secondary | ICD-10-CM | POA: Diagnosis not present

## 2018-05-25 DIAGNOSIS — H04123 Dry eye syndrome of bilateral lacrimal glands: Secondary | ICD-10-CM | POA: Diagnosis not present

## 2018-05-25 DIAGNOSIS — H01021 Squamous blepharitis right upper eyelid: Secondary | ICD-10-CM | POA: Diagnosis not present

## 2018-05-25 DIAGNOSIS — H01022 Squamous blepharitis right lower eyelid: Secondary | ICD-10-CM | POA: Diagnosis not present

## 2018-05-25 DIAGNOSIS — H02831 Dermatochalasis of right upper eyelid: Secondary | ICD-10-CM | POA: Diagnosis not present

## 2018-06-01 LAB — CUP PACEART REMOTE DEVICE CHECK
Battery Voltage: 2.99 V
Brady Statistic AP VP Percent: 76 %
Brady Statistic AP VS Percent: 1 %
Brady Statistic AS VP Percent: 23 %
Brady Statistic RA Percent Paced: 50 %
Brady Statistic RV Percent Paced: 99 %
Date Time Interrogation Session: 20190904193606
Implantable Lead Implant Date: 20170202
Implantable Lead Location: 753859
Implantable Lead Location: 753860
Lead Channel Impedance Value: 450 Ohm
Lead Channel Pacing Threshold Amplitude: 1.25 V
Lead Channel Pacing Threshold Pulse Width: 0.4 ms
Lead Channel Sensing Intrinsic Amplitude: 12 mV
Lead Channel Setting Pacing Amplitude: 1.125
Lead Channel Setting Pacing Amplitude: 2.5 V
Lead Channel Setting Sensing Sensitivity: 2 mV
MDC IDC LEAD IMPLANT DT: 20170202
MDC IDC MSMT BATTERY REMAINING LONGEVITY: 104 mo
MDC IDC MSMT BATTERY REMAINING PERCENTAGE: 95.5 %
MDC IDC MSMT LEADCHNL RA IMPEDANCE VALUE: 380 Ohm
MDC IDC MSMT LEADCHNL RA SENSING INTR AMPL: 2.6 mV
MDC IDC MSMT LEADCHNL RV PACING THRESHOLD AMPLITUDE: 0.875 V
MDC IDC MSMT LEADCHNL RV PACING THRESHOLD PULSEWIDTH: 0.4 ms
MDC IDC PG IMPLANT DT: 20170202
MDC IDC SET LEADCHNL RV PACING PULSEWIDTH: 0.4 ms
MDC IDC STAT BRADY AS VS PERCENT: 1 %
Pulse Gen Serial Number: 7864186

## 2018-06-05 ENCOUNTER — Ambulatory Visit: Payer: Medicare Other | Admitting: Internal Medicine

## 2018-06-08 ENCOUNTER — Ambulatory Visit (INDEPENDENT_AMBULATORY_CARE_PROVIDER_SITE_OTHER): Payer: Medicare Other | Admitting: *Deleted

## 2018-06-08 ENCOUNTER — Encounter: Payer: Self-pay | Admitting: Cardiology

## 2018-06-08 ENCOUNTER — Ambulatory Visit (INDEPENDENT_AMBULATORY_CARE_PROVIDER_SITE_OTHER): Payer: Medicare Other | Admitting: Cardiology

## 2018-06-08 VITALS — BP 128/72 | HR 84 | Ht 69.0 in | Wt 284.0 lb

## 2018-06-08 DIAGNOSIS — D1801 Hemangioma of skin and subcutaneous tissue: Secondary | ICD-10-CM | POA: Diagnosis not present

## 2018-06-08 DIAGNOSIS — I25810 Atherosclerosis of coronary artery bypass graft(s) without angina pectoris: Secondary | ICD-10-CM

## 2018-06-08 DIAGNOSIS — I480 Paroxysmal atrial fibrillation: Secondary | ICD-10-CM | POA: Diagnosis not present

## 2018-06-08 DIAGNOSIS — I5032 Chronic diastolic (congestive) heart failure: Secondary | ICD-10-CM

## 2018-06-08 DIAGNOSIS — R3912 Poor urinary stream: Secondary | ICD-10-CM | POA: Diagnosis not present

## 2018-06-08 DIAGNOSIS — L821 Other seborrheic keratosis: Secondary | ICD-10-CM | POA: Diagnosis not present

## 2018-06-08 DIAGNOSIS — E785 Hyperlipidemia, unspecified: Secondary | ICD-10-CM | POA: Diagnosis not present

## 2018-06-08 DIAGNOSIS — Z950 Presence of cardiac pacemaker: Secondary | ICD-10-CM | POA: Diagnosis not present

## 2018-06-08 DIAGNOSIS — Z85828 Personal history of other malignant neoplasm of skin: Secondary | ICD-10-CM | POA: Diagnosis not present

## 2018-06-08 DIAGNOSIS — L570 Actinic keratosis: Secondary | ICD-10-CM | POA: Diagnosis not present

## 2018-06-08 DIAGNOSIS — C61 Malignant neoplasm of prostate: Secondary | ICD-10-CM | POA: Diagnosis not present

## 2018-06-08 DIAGNOSIS — L812 Freckles: Secondary | ICD-10-CM | POA: Diagnosis not present

## 2018-06-08 DIAGNOSIS — R350 Frequency of micturition: Secondary | ICD-10-CM | POA: Diagnosis not present

## 2018-06-08 LAB — BASIC METABOLIC PANEL
BUN/Creatinine Ratio: 17 (ref 10–24)
BUN: 18 mg/dL (ref 8–27)
CHLORIDE: 100 mmol/L (ref 96–106)
CO2: 22 mmol/L (ref 20–29)
Calcium: 9 mg/dL (ref 8.6–10.2)
Creatinine, Ser: 1.07 mg/dL (ref 0.76–1.27)
GFR calc Af Amer: 72 mL/min/{1.73_m2} (ref >59)
GFR calc non Af Amer: 62 mL/min/{1.73_m2} (ref >59)
GLUCOSE: 94 mg/dL (ref 65–99)
POTASSIUM: 4.5 mmol/L (ref 3.5–5.2)
SODIUM: 138 mmol/L (ref 134–144)

## 2018-06-08 LAB — CBC
HEMATOCRIT: 42 % (ref 37.5–51.0)
Hemoglobin: 14 g/dL (ref 13.0–17.7)
MCH: 30.5 pg (ref 26.6–33.0)
MCHC: 33.3 g/dL (ref 31.5–35.7)
MCV: 92 fL (ref 79–97)
PLATELETS: 125 10*3/uL — AB (ref 150–450)
RBC: 4.59 x10E6/uL (ref 4.14–5.80)
RDW: 14.6 % (ref 12.3–15.4)
WBC: 8.3 10*3/uL (ref 3.4–10.8)

## 2018-06-08 NOTE — Progress Notes (Signed)
Cardiology Office Note:    Date:  06/08/2018   ID:  Levi [name], DOB 03-16-1929, MRN [id number]  PCP:  Bernerd Limbo, MD  Cardiologist:  [name], MD  Referring MD: Bernerd Limbo, MD   Chief Complaint  Patient presents with  . Follow-up    Heart failure    History of Present Illness:    Levi [name] is a 84 y.o. male with a past medical history significant for CAD status post CABG in 2011, DES to the LAD in 2014, PAF(CHA2DS2-VASc score   4),permanent pacemaker ([place]).  Cardiac cath at [facility] in 02/19/2017 showed three-vessel CAD. LM is diffusely diseased with 50% distal stenosis. LAD has a 80% mid instent restenosis, LCX has serial 50-60% stenoses in the mid segment, OM2 is a small vessel and has a 70% ostial stenosis. RCA has long diffuse 50-60% proximal and 60% distal stenosis. LIMA to LAD is patent with a kink in the mid segment. Flow is normal. LV-Gram reveals EF 50%. LVEDP is 8 mmHg. SVG grafts are known to be chronically occluded, so not injected.  Medical therapy was recommended.  In 08/21/2017 the patient went to the ER with syncope and was found to have SVT and atrial flutter.  His pacemaker interrogation revealed atrial fib prior to his syncope.  He also had orthostatic hypotension his amlodipine and spironolactone were stopped.  He was still dizzy when he got home and blood pressures were running low so losartan was stopped.  The patient was seen by Dr. Harrington Challenger in 11/19/2017 at which time his volume was up. Pro BNP was 2500 He had been on lasix prn.  He was placed on Lasix 40 mg daily except 80 mg daily every 3rd day.  In April his BNP was 2091. Lasix was increased to 40 mg alternation with 80 mg.  He was seen again in June 2019 at which time he was still having some shortness of breath with activity, although it had improved.  His weight was down about 20 pounds.  He was urinating a lot and wanted to decrease the Lasix but it was felt that continuation of this  treatment was best. Pro BNP on 03/04/18 was 1969. Now on lasix 80 mg daily.   Levi [name] if here today with his daughter. He continues to have DOE with walking in from the parking lot. He also has orthopnea and sleeps in a reliner and attributes this to nasal congestion. He admits to eating bacon and sausage frequently and eats out for most of his meals. His wt is up a few pounds. He feels comfortable with where he is at present.   Levi [name] says that he is compliant with lasix 80 mg daily except when he needs to go out and uses potassium "most of the time". His daughter wonders how often he is actually taking these meds.   His daughter asks about CBD oil for his joint pains as her sister takes it with good results. I advised that without long term safety data I cannot recommend it. They can try it at their own risk.    Past Medical History:  Diagnosis Date  . Asthma   . Benign neoplasm of colon   . CAD (coronary artery disease) Dec 2011   s/p CABG per Dr. Roxy Manns; had normal EF; All SVGs occluded per follow up cath with only LIMA to LAD patent; s/p PCI of the proximal and mid LAD February 2014 per Dr. Burt Knack  . Dyspnea  on exertion   . Edema   . Emphysema    "never treated for it" (10/05/2015)  . History of hiatal hernia   . HLD (hyperlipidemia)   . HTN (hypertension)   . Malaise and fatigue   . Obesity   . Osteopenia   . Pneumonia 1960s X 1; 1990s X 1  . Presence of permanent cardiac pacemaker   . Prostate cancer ([place])   . Skin cancer    "face, nose, top of ears, scalp"  . Symptomatic bradycardia    STJ PPM, 10/05/15, Dr. Lovena Le    Past Surgical History:  Procedure Laterality Date  . ANTERIOR CERVICAL DECOMP/DISCECTOMY FUSION    . BACK SURGERY    . C-EYE SURGERY PROCEDURE    . CATARACT EXTRACTION Right   . CORONARY ANGIOPLASTY WITH STENT PLACEMENT  10/14/12   with stent to proximal and mid LAD  . CORONARY ARTERY BYPASS GRAFT  08/23/10   "CABG X4"  . EP IMPLANTABLE DEVICE N/A  10/05/2015   Procedure: Pacemaker Implant;  Surgeon: [name], MD;  Location: [facility];  Service: Cardiovascular;  Laterality: N/A;  . EP IMPLANTABLE DEVICE N/A 10/06/2015   Procedure: Lead Revision/Repair;  Surgeon: [name], MD;  Location: [place] [facility];  Service: Cardiovascular;  Laterality: N/A;  . EYE SURGERY    . INSERT / REPLACE / REMOVE PACEMAKER  10/05/2015  . LEFT HEART CATHETERIZATION WITH CORONARY/GRAFT ANGIOGRAM N/A 09/01/2012   Procedure: LEFT HEART CATHETERIZATION WITH [name];  Surgeon: [name], MD;  Location: [facility] CATH LAB;  Service: Cardiovascular;  Laterality: N/A;  . MEDIAN STERNOTOMY  08/23/10  . MOHS SURGERY  "@ least twice"  . PACEMAKER INSERTION     STJ 10/05/15  . PERCUTANEOUS CORONARY STENT INTERVENTION (PCI-S) N/A 10/14/2012   Procedure: PERCUTANEOUS CORONARY STENT INTERVENTION (PCI-S);  Surgeon: [name], MD;  Location: [facility] CATH LAB;  Service: Cardiovascular;  Laterality: N/A;  . PROSTATE BIOPSY    . TRANSURETHRAL RESECTION OF PROSTATE      Current Medications: Current Meds  Medication Sig  . apixaban (ELIQUIS) 5 MG TABS tablet Take 1 tablet (5 mg total) by mouth 2 (two) times daily. To start when instructed after eye surgery  . carvedilol (COREG) 6.25 MG tablet Take 1 tablet (6.25 mg total) by mouth 2 (two) times daily.  . furosemide (LASIX) 40 MG tablet Take 80 mg by mouth daily.  Marland Kitchen ipratropium (ATROVENT) 0.06 % nasal spray Place 2 sprays into both nostrils 3 (three) times daily.  . nitroGLYCERIN (NITROSTAT) 0.4 MG SL tablet Place 0.4 mg under the tongue every 5 (five) minutes x 3 doses as needed for chest pain.  . polyethylene glycol powder (GLYCOLAX/MIRALAX) powder Take 1 Container by mouth daily.   . potassium chloride SA (K-DUR,KLOR-CON) 20 MEQ tablet Take 1 tablet (20 mEq total) by mouth daily.  . pravastatin (PRAVACHOL) 20 MG tablet TAKE 1 TABLET BY MOUTH  DAILY  . tamsulosin (FLOMAX) 0.4 MG CAPS  capsule Take 0.4 mg by mouth daily.     Allergies:   Patient has no known allergies.   Social History   Socioeconomic History  . Marital status: Widowed    Spouse name: Not on file  . Number of children: Not on file  . Years of education: Not on file  . Highest education level: Not on file  Occupational History  . Occupation: retired Information systems manager: RETIRED  Social Needs  . Financial resource strain:  Not on file  . Food insecurity:    Worry: Not on file    Inability: Not on file  . Transportation needs:    Medical: Not on file    Non-medical: Not on file  Tobacco Use  . Smoking status: Former Smoker    Packs/day: 1.00    Years: 30.00    Pack years: 30.00    Types: Cigarettes    Last attempt to quit: 09/07/1975    Years since quitting: 42.7  . Smokeless tobacco: Former Systems developer    Types: [place] date: 07/27/2015  Substance and Sexual Activity  . Alcohol use: No    Alcohol/week: 0.0 standard drinks  . Drug use: No  . Sexual activity: Not Currently  Lifestyle  . Physical activity:    Days per week: Not on file    Minutes per session: Not on file  . Stress: Not on file  Relationships  . Social connections:    Talks on phone: Not on file    Gets together: Not on file    Attends religious service: Not on file    Active member of club or organization: Not on file    Attends meetings of clubs or organizations: Not on file    Relationship status: Not on file  Other Topics Concern  . Not on file  Social History Narrative  . Not on file     Family History: The patient's family history includes Heart attack in his father; Heart disease in his brother; Hypertension in his mother; Kidney cancer in his father; Prostate cancer in his brother; Skin cancer in his brother; Stroke in his mother. There is no history of Allergic rhinitis, Asthma, Eczema, or Urticaria. ROS:   Please see the history of present illness.     All other systems reviewed and are  negative.  EKGs/Labs/Other Studies Reviewed:    The following studies were reviewed today:  Pacemaker device check 06/01/2018 Normal remote reviewed. 33% AF burden. [place] - Eliquis. Persistent since June.  Next follow up 08/04/18  Echo 06/20/2016 Study Conclusions - Left ventricle: Abnormal septal motion Systolic function was   normal. The estimated ejection fraction was in the range of 50%   to 55%. Doppler parameters are consistent with abnormal left   ventricular relaxation (grade 1 diastolic dysfunction). - Mitral valve: Calcified annulus. Mildly thickened leaflets . - Left atrium: The atrium was moderately dilated. - Atrial septum: No defect or patent foramen ovale was identified.  EKG:  EKG is ordered today.  Recent Labs: 08/06/2017: B Natriuretic Peptide 143.1 12/19/2017: Hemoglobin 14.3; Platelets 167 03/04/2018: BUN 17; Creatinine, Ser 0.98; NT-Pro BNP 1,969; Potassium 3.8; Sodium 139   Recent Lipid Panel    Component Value Date/Time   CHOL 113 06/01/2014 0930   TRIG 115.0 06/01/2014 0930   HDL 32.40 (L) 06/01/2014 0930   CHOLHDL 3 06/01/2014 0930   VLDL 23.0 06/01/2014 0930   LDLCALC 58 06/01/2014 0930   LDLDIRECT 70.7 06/10/2013 1344    Physical Exam:    VS:  BP 128/72   Pulse 84   Ht 5\' 9"  (1.753 m)   Wt 284 lb (128.8 kg)   SpO2 94%   BMI 41.94 kg/m     Wt Readings from Last 3 Encounters:  06/08/18 284 lb (128.8 kg)  02/26/18 280 lb 12.8 oz (127.4 kg)  02/02/18 282 lb 3.2 oz (128 kg)     Physical Exam  Constitutional: He is oriented to person, place, and  time. He appears well-developed and well-nourished.  HENT:  Head: Normocephalic and atraumatic.  Neck: Normal range of motion. Neck supple. No JVD present.  Cardiovascular: Normal rate, regular rhythm and normal heart sounds. Exam reveals no gallop and no friction rub.  No murmur heard. Pulmonary/Chest: Effort normal and breath sounds normal. No respiratory distress. He has no wheezes. He has no  rales.  Abdominal: Soft. Bowel sounds are normal.  Musculoskeletal: Normal range of motion. He exhibits edema.  Trace - 1+ lower leg edema.   Neurological: He is alert and oriented to person, place, and time.  Skin: Skin is warm and dry.  Psychiatric: He has a normal mood and affect. His behavior is normal. Judgment and thought content normal.  Vitals reviewed.   ASSESSMENT:    1. Chronic diastolic congestive heart failure ([place])   2. Coronary artery disease involving coronary bypass graft of native heart without angina pectoris   3. PAF (paroxysmal atrial fibrillation) (HCC)   4. Cardiac pacemaker   5. Hyperlipidemia, unspecified hyperlipidemia type    PLAN:    In order of problems listed above:   Diastolic CHF: Wt is up ~4 pounds. Pt has persistent DOE and LE edema. He states is taking the lasix 80 mg daily except when he goes out. His daughter wonders how often he does not take it since he goes out to eat everyday. Offered to increase lasix, but daughter says he won't take it. Will continue current medical therapy.  We had an indepth discussion on sodiium restriction. He does not add salt but eats out for most of his meals. We reviewed getting some frozen meals for home and reviewed how to read labels for 2 gram sodium limit per day. His daughter agrees to help him. Also recommended compression stockings.  Will check BMet considering increased lasix for renal function and potassium.   CAD: No angina  PAF: On Eliquis 5 mg twice daily for stroke risk reduction.  Last pacemaker interrogation on 06/01/2018 showed 33% A. fib burden.  Rate controlled on beta-blocker. Today HR regular. Pt denies palpitations.  History complete heart block: S/P PPM. Followed by Dr. Lovena Le  Hyperlipidemia: On pravastatin 20 mg daily. Will need updated lipid panel.    Medication Adjustments/Labs and Tests Ordered: Current medicines are reviewed at length with the patient today.  Concerns regarding  medicines are outlined above. Labs and tests ordered and medication changes are outlined in the patient instructions below:  Patient Instructions   Medication Instructions:  Your physician recommends that you continue on your current medications as directed. Please refer to the Current Medication list given to you today.  If you need a refill on your cardiac medications before your next appointment, please call your pharmacy.   Lab work: TODAY: BMET,CBC   If you have labs (blood work) drawn today and your tests are completely normal, you will receive your results only by: Marland Kitchen MyChart Message (if you have MyChart) OR . A paper copy in the mail If you have any lab test that is abnormal or we need to change your treatment, we will call you to review the results.  Testing/Procedures: None   Follow-Up: At [facility], you and your health needs are our priority.  As part of our continuing mission to provide you with exceptional heart care, we have created designated Provider Care Teams.  These Care Teams include your primary Cardiologist (physician) and Advanced Practice Providers (APPs -  Physician Assistants and Nurse Practitioners) who all work  together to provide you with the care you need, when you need it. You will need a follow up appointment in:  3 months.  Please call our office 2 months in advance to schedule this appointment.  You may see [name], MD or one of the following Advanced Practice Providers on your designated Care Team: Richardson Dopp, PA-C [place], [place] . Daune Perch, NP  Any Other Special Instructions Will Be Listed Below (If Applicable).  Peripheral Edema Peripheral edema is swelling that is caused by a buildup of fluid. Peripheral edema most often affects the lower legs, ankles, and feet. It can also develop in the arms, hands, and face. The area of the body that has peripheral edema will look swollen. It may also feel heavy or warm. Your clothes may start to  feel tight. Pressing on the area may make a temporary dent in your skin. You may not be able to move your arm or leg as much as usual. There are many causes of peripheral edema. It can be a complication of other diseases, such as congestive heart failure, kidney disease, or a problem with your blood circulation. It also can be a side effect of certain medicines. It often happens to women during pregnancy. Sometimes, the cause is not known. Treating the underlying condition is often the only treatment for peripheral edema. Follow these instructions at home: Pay attention to any changes in your symptoms. Take these actions to help with your discomfort:  Raise (elevate) your legs while you are sitting or lying down.  Move around often to prevent stiffness and to lessen swelling. Do not sit or stand for long periods of time.  Wear support stockings as told by your health care provider.  Follow instructions from your health care provider about limiting salt (sodium) in your diet. Sometimes eating less salt can reduce swelling.  Take over-the-counter and prescription medicines only as told by your health care provider. Your health care provider may prescribe medicine to help your body get rid of excess water (diuretic).  Keep all follow-up visits as told by your health care provider. This is important.  Contact a health care provider if:  You have a fever.  Your edema starts suddenly or is getting worse, especially if you are pregnant or have a medical condition.  You have swelling in only one leg.  You have increased swelling and pain in your legs. Get help right away if:  You develop shortness of breath, especially when you are lying down.  You have pain in your chest or abdomen.  You feel weak.  You faint. This information is not intended to replace advice given to you by your health care provider. Make sure you discuss any questions you have with your health care provider. Document  Released: 09/26/2004 Document Revised: 01/22/2016 Document Reviewed: 03/01/2015 Elsevier Interactive Patient Education  2018 [place].  Two Gram Sodium Diet 2000 mg  What is Sodium? Sodium is a mineral found naturally in many foods. The most significant source of sodium in the diet is table salt, which is about 40% sodium.  Processed, convenience, and preserved foods also contain a large amount of sodium.  The body needs only 500 mg of sodium daily to function,  A normal diet provides more than enough sodium even if you do not use salt.  Why Limit Sodium? A build up of sodium in the body can cause thirst, increased blood pressure, shortness of breath, and water retention.  Decreasing sodium in  the diet can reduce edema and risk of heart attack or stroke associated with high blood pressure.  Keep in mind that there are many other factors involved in these health problems.  Heredity, obesity, lack of exercise, cigarette smoking, stress and what you eat all play a role.  General Guidelines:  Do not add salt at the table or in cooking.  One teaspoon of salt contains over 2 grams of sodium.  Read food labels  Avoid processed and convenience foods  Ask your dietitian before eating any foods not dicussed in the menu planning guidelines  Consult your physician if you wish to use a salt substitute or a sodium containing medication such as antacids.  Limit milk and milk products to 16 oz (2 cups) per day.  Shopping Hints:  READ LABELS!! "Dietetic" does not necessarily mean low sodium.  Salt and other sodium ingredients are often added to foods during processing.   Menu Planning Guidelines Food Group Choose More Often Avoid  Beverages (see also the milk group All fruit juices, low-sodium, salt-free vegetables juices, low-sodium carbonated beverages Regular vegetable or tomato juices, commercially softened water used for drinking or cooking  Breads and Cereals Enriched white, wheat, rye and  pumpernickel bread, hard rolls and dinner rolls; muffins, cornbread and waffles; most dry cereals, cooked cereal without added salt; unsalted crackers and breadsticks; low sodium or homemade bread crumbs Bread, rolls and crackers with salted tops; quick breads; instant hot cereals; pancakes; commercial bread stuffing; self-rising flower and biscuit mixes; regular bread crumbs or cracker crumbs  Desserts and Sweets Desserts and sweets mad with mild should be within allowance Instant pudding mixes and cake mixes  Fats Butter or margarine; vegetable oils; unsalted salad dressings, regular salad dressings limited to 1 Tbs; light, sour and heavy cream Regular salad dressings containing bacon fat, bacon bits, and salt pork; snack dips made with instant soup mixes or processed cheese; salted nuts  Fruits Most fresh, frozen and canned fruits Fruits processed with salt or sodium-containing ingredient (some dried fruits are processed with sodium sulfites        Vegetables Fresh, frozen vegetables and low- sodium canned vegetables Regular canned vegetables, sauerkraut, pickled vegetables, and others prepared in brine; frozen vegetables in sauces; vegetables seasoned with ham, bacon or salt pork  Condiments, Sauces, Miscellaneous  Salt substitute with physician's approval; pepper, herbs, spices; vinegar, lemon or lime juice; hot pepper sauce; garlic powder, onion powder, low sodium soy sauce (1 Tbs.); low sodium condiments (ketchup, chili sauce, mustard) in limited amounts (1 tsp.) fresh ground horseradish; unsalted tortilla chips, pretzels, potato chips, popcorn, salsa (1/4 cup) Any seasoning made with salt including garlic salt, celery salt, onion salt, and seasoned salt; sea salt, rock salt, kosher salt; meat tenderizers; monosodium glutamate; mustard, regular soy sauce, barbecue, sauce, chili sauce, teriyaki sauce, steak sauce, Worcestershire sauce, and most flavored vinegars; canned gravy and mixes; regular  condiments; salted snack foods, olives, picles, relish, horseradish sauce, catsup   Food preparation: Try these seasonings Meats:    Pork Sage, onion Serve with applesauce  Chicken Poultry seasoning, thyme, parsley Serve with cranberry sauce  Lamb Curry powder, rosemary, garlic, thyme Serve with mint sauce or jelly  Veal Marjoram, basil Serve with current jelly, cranberry sauce  Beef Pepper, bay leaf Serve with dry mustard, unsalted chive butter  Fish [place] leaf, dill Serve with unsalted lemon butter, unsalted parsley butter  Vegetables:    Asparagus Lemon juice   Broccoli Lemon juice   Carrots Mustard dressing  parsley, mint, nutmeg, glazed with unsalted butter and sugar   Green beans Marjoram, lemon juice, nutmeg,dill seed   Tomatoes Basil, marjoram, onion   Spice /blend for [employer]" 4 tsp ground thyme 1 tsp ground sage 3 tsp ground rosemary 4 tsp ground marjoram   Test your knowledge 1. A product that says "Salt Free" may still contain sodium. True or False 2. Garlic Powder and Hot Pepper Sauce an be used as alternative seasonings.True or False 3. Processed foods have more sodium than fresh foods.  True or False 4. Canned Vegetables have less sodium than froze True or False  WAYS TO DECREASE YOUR SODIUM INTAKE 1. Avoid the use of added salt in cooking and at the table.  Table salt (and other prepared seasonings which contain salt) is probably one of the greatest sources of sodium in the diet.  Unsalted foods can gain flavor from the sweet, sour, and butter taste sensations of herbs and spices.  Instead of using salt for seasoning, try the following seasonings with the foods listed.  Remember: how you use them to enhance natural food flavors is limited only by your creativity... Allspice-Meat, fish, eggs, fruit, peas, red and yellow vegetables Almond Extract-Fruit baked goods Anise Seed-Sweet breads, fruit, carrots, beets, cottage cheese, cookies (tastes like licorice) Basil-Meat,  fish, eggs, vegetables, rice, vegetables salads, soups, sauces Bay Leaf-Meat, fish, stews, poultry Burnet-Salad, vegetables (cucumber-like flavor) Caraway Seed-Bread, cookies, cottage cheese, meat, vegetables, cheese, rice Cardamon-Baked goods, fruit, soups Celery Powder or seed-Salads, salad dressings, sauces, meatloaf, soup, bread.Do not use  celery salt Chervil-Meats, salads, fish, eggs, vegetables, cottage cheese (parsley-like flavor) Chili Power-Meatloaf, chicken cheese, corn, eggplant, egg dishes Chives-Salads cottage cheese, egg dishes, soups, vegetables, sauces Cilantro-Salsa, casseroles Cinnamon-Baked goods, fruit, pork, lamb, chicken, carrots Cloves-Fruit, baked goods, fish, pot roast, green beans, beets, carrots Coriander-Pastry, cookies, meat, salads, cheese (lemon-orange flavor) Cumin-Meatloaf, fish,cheese, eggs, cabbage,fruit pie (caraway flavor) [employer], fruit, eggs, fish, poultry, cottage cheese, vegetables Dill Seed-Meat, cottage cheese, poultry, vegetables, fish, salads, bread Fennel Seed-Bread, cookies, apples, pork, eggs, fish, beets, cabbage, cheese, Licorice-like flavor Garlic-(buds or powder) Salads, meat, poultry, fish, bread, butter, vegetables, potatoes.Do not  use garlic salt Ginger-Fruit, vegetables, baked goods, meat, fish, poultry Horseradish Root-Meet, vegetables, butter Lemon Juice or Extract-Vegetables, fruit, tea, baked goods, fish salads Mace-Baked goods fruit, vegetables, fish, poultry (taste like nutmeg) Maple Extract-Syrups Marjoram-Meat, chicken, fish, vegetables, breads, green salads (taste like Sage) Mint-Tea, lamb, sherbet, vegetables, desserts, carrots, cabbage Mustard, Dry or Seed-Cheese, eggs, meats, vegetables, poultry Nutmeg-Baked goods, fruit, chicken, eggs, vegetables, desserts Onion Powder-Meat, fish, poultry, vegetables, cheese, eggs, bread, rice salads (Do not use   Onion salt) Orange Extract-Desserts, baked  goods Oregano-Pasta, eggs, cheese, onions, pork, lamb, fish, chicken, vegetables, green salads Paprika-Meat, fish, poultry, eggs, cheese, vegetables Parsley Flakes-Butter, vegetables, meat fish, poultry, eggs, bread, salads (certain forms may   Contain sodium Pepper-Meat fish, poultry, vegetables, eggs Peppermint Extract-Desserts, baked goods Poppy Seed-Eggs, bread, cheese, fruit dressings, baked goods, noodles, vegetables, cottage  [employer], poultry, meat, fish, cauliflower, turnips,eggs bread Saffron-Rice, bread, veal, chicken, fish, eggs Sage-Meat, fish, poultry, onions, eggplant, tomateos, pork, stews Savory-Eggs, salads, poultry, meat, rice, vegetables, soups, pork Tarragon-Meat, poultry, fish, eggs, butter, vegetables (licorice-like flavor)  Thyme-Meat, poultry, fish, eggs, vegetables, (clover-like flavor), sauces, soups Tumeric-Salads, butter, eggs, fish, rice, vegetables (saffron-like flavor) Vanilla Extract-Baked goods, candy Vinegar-Salads, vegetables, meat marinades Walnut Extract-baked goods, candy  2. Choose your Foods Wisely   The following is a list of foods to avoid  which are high in sodium:  Meats-Avoid all smoked, canned, salt cured, dried and kosher meat and fish as well as Anchovies   [employer] meats:Bologna, Liverwurst, Pastrami Canned meat or fish  Marinated herring Caviar    Pepperoni Corned Beef   Pizza Dried chipped beef  Salami Frozen breaded fish or meat Salt pork Frankfurters or hot dogs  Sardines Gefilte fish   Sausage Ham (boiled ham, Proscuitto Smoked butt    spiced ham)   Spam      TV Dinners Vegetables Canned vegetables (Regular) Relish Canned mushrooms  Sauerkraut Olives    Tomato juice Pickles  Bakery and Dessert Products Canned puddings  Cream pies Cheesecake   Decorated cakes Cookies  Beverages/Juices Tomato juice, regular  Gatorade   V-8 vegetable juice, regular  Breads and  Cereals Biscuit mixes   Salted potato chips, corn chips, pretzels Bread stuffing mixes  Salted crackers and rolls Pancake and waffle mixes Self-rising flour  Seasonings Accent    Meat sauces Barbecue sauce  Meat tenderizer Catsup    Monosodium glutamate (MSG) Celery salt   Onion salt Chili sauce   Prepared mustard Garlic salt   Salt, seasoned salt, sea salt Gravy mixes   Soy sauce Horseradish   Steak sauce Ketchup   Tartar sauce Lite salt    Teriyaki sauce Marinade mixes   Worcestershire sauce  Others Baking powder   Cocoa and cocoa mixes Baking soda   Commercial casserole mixes Candy-caramels, chocolate  Dehydrated soups    Bars, fudge,nougats  Instant rice and pasta mixes Canned broth or soup  Maraschino cherries Cheese, aged and processed cheese and cheese spreads  Learning Assessment Quiz  Indicated T (for True) or F (for False) for each of the following statements:  1. _____ Fresh fruits and vegetables and unprocessed grains are generally low in sodium 2. _____ Water may contain a considerable amount of sodium, depending on the source 3. _____ You can always tell if a food is high in sodium by tasting it 4. _____ Certain laxatives my be high in sodium and should be avoided unless prescribed   by a physician or pharmacist 5. _____ Salt substitutes may be used freely by anyone on a sodium restricted diet 6. _____ Sodium is present in table salt, food additives and as a natural component of   most foods 7. _____ Table salt is approximately 90% sodium 8. _____ Limiting sodium intake may help prevent excess fluid accumulation in the body 9. _____ On a sodium-restricted diet, seasonings such as bouillon soy sauce, and    cooking wine should be used in place of table salt 10. _____ On an ingredient list, a product which lists monosodium glutamate as the first   ingredient is an appropriate food to include on a low sodium diet  Circle the best answer(s) to the following  statements (Hint: there may be more than one correct answer)  11. On a low-sodium diet, some acceptable snack items are:    A. Olives  F. Bean dip   K. Grapefruit juice    B. Salted Pretzels G. Commercial Popcorn   L. Canned peaches    C. Carrot Sticks  H. Bouillon   M. Unsalted nuts   D. [place] fries  I. Peanut butter crackers N. Salami   E. Sweet pickles J. Tomato Juice   O. Pizza  12.  Seasonings that may be used freely on a reduced - sodium diet include   A. [place]  wedges F.Monosodium glutamate K. Celery seed    B.Soysauce   G. Pepper   L. Mustard powder   C. Sea salt  H. Cooking wine  M. Onion flakes   D. Vinegar  E. Prepared horseradish N. Salsa   E. Sage   J. Worcestershire sauce  O. Chutney      Signed, Daune Perch, NP  06/08/2018 10:20 AM    [facility]

## 2018-06-08 NOTE — Patient Instructions (Signed)
Medication Instructions:  Your physician recommends that you continue on your current medications as directed. Please refer to the Current Medication list given to you today.  If you need a refill on your cardiac medications before your next appointment, please call your pharmacy.   Lab work: TODAY: BMET,CBC   If you have labs (blood work) drawn today and your tests are completely normal, you will receive your results only by: Marland Kitchen MyChart Message (if you have MyChart) OR . A paper copy in the mail If you have any lab test that is abnormal or we need to change your treatment, we will call you to review the results.  Testing/Procedures: None   Follow-Up: At [facility], you and your health needs are our priority.  As part of our continuing mission to provide you with exceptional heart care, we have created designated Provider Care Teams.  These Care Teams include your primary Cardiologist (physician) and Advanced Practice Providers (APPs -  Physician Assistants and Nurse Practitioners) who all work together to provide you with the care you need, when you need it. You will need a follow up appointment in:  3 months.  Please call our office 2 months in advance to schedule this appointment.  You may see [name], MD or one of the following Advanced Practice Providers on your designated Care Team: Richardson Dopp, PA-C [place], [place] . Daune Perch, NP  Any Other Special Instructions Will Be Listed Below (If Applicable).  Peripheral Edema Peripheral edema is swelling that is caused by a buildup of fluid. Peripheral edema most often affects the lower legs, ankles, and feet. It can also develop in the arms, hands, and face. The area of the body that has peripheral edema will look swollen. It may also feel heavy or warm. Your clothes may start to feel tight. Pressing on the area may make a temporary dent in your skin. You may not be able to move your arm or leg as much as usual. There are many  causes of peripheral edema. It can be a complication of other diseases, such as congestive heart failure, kidney disease, or a problem with your blood circulation. It also can be a side effect of certain medicines. It often happens to women during pregnancy. Sometimes, the cause is not known. Treating the underlying condition is often the only treatment for peripheral edema. Follow these instructions at home: Pay attention to any changes in your symptoms. Take these actions to help with your discomfort:  Raise (elevate) your legs while you are sitting or lying down.  Move around often to prevent stiffness and to lessen swelling. Do not sit or stand for long periods of time.  Wear support stockings as told by your health care provider.  Follow instructions from your health care provider about limiting salt (sodium) in your diet. Sometimes eating less salt can reduce swelling.  Take over-the-counter and prescription medicines only as told by your health care provider. Your health care provider may prescribe medicine to help your body get rid of excess water (diuretic).  Keep all follow-up visits as told by your health care provider. This is important.  Contact a health care provider if:  You have a fever.  Your edema starts suddenly or is getting worse, especially if you are pregnant or have a medical condition.  You have swelling in only one leg.  You have increased swelling and pain in your legs. Get help right away if:  You develop shortness of breath, especially  when you are lying down.  You have pain in your chest or abdomen.  You feel weak.  You faint. This information is not intended to replace advice given to you by your health care provider. Make sure you discuss any questions you have with your health care provider. Document Released: 09/26/2004 Document Revised: 01/22/2016 Document Reviewed: 03/01/2015 Elsevier Interactive Patient Education  2018 [employer].  Two  Gram Sodium Diet 2000 mg  What is Sodium? Sodium is a mineral found naturally in many foods. The most significant source of sodium in the diet is table salt, which is about 40% sodium.  Processed, convenience, and preserved foods also contain a large amount of sodium.  The body needs only 500 mg of sodium daily to function,  A normal diet provides more than enough sodium even if you do not use salt.  Why Limit Sodium? A build up of sodium in the body can cause thirst, increased blood pressure, shortness of breath, and water retention.  Decreasing sodium in the diet can reduce edema and risk of heart attack or stroke associated with high blood pressure.  Keep in mind that there are many other factors involved in these health problems.  Heredity, obesity, lack of exercise, cigarette smoking, stress and what you eat all play a role.  General Guidelines:  Do not add salt at the table or in cooking.  One teaspoon of salt contains over 2 grams of sodium.  Read food labels  Avoid processed and convenience foods  Ask your dietitian before eating any foods not dicussed in the menu planning guidelines  Consult your physician if you wish to use a salt substitute or a sodium containing medication such as antacids.  Limit milk and milk products to 16 oz (2 cups) per day.  Shopping Hints:  READ LABELS!! "Dietetic" does not necessarily mean low sodium.  Salt and other sodium ingredients are often added to foods during processing.   Menu Planning Guidelines Food Group Choose More Often Avoid  Beverages (see also the milk group All fruit juices, low-sodium, salt-free vegetables juices, low-sodium carbonated beverages Regular vegetable or tomato juices, commercially softened water used for drinking or cooking  Breads and Cereals Enriched white, wheat, rye and pumpernickel bread, hard rolls and dinner rolls; muffins, cornbread and waffles; most dry cereals, cooked cereal without added salt; unsalted  crackers and breadsticks; low sodium or homemade bread crumbs Bread, rolls and crackers with salted tops; quick breads; instant hot cereals; pancakes; commercial bread stuffing; self-rising flower and biscuit mixes; regular bread crumbs or cracker crumbs  Desserts and Sweets Desserts and sweets mad with mild should be within allowance Instant pudding mixes and cake mixes  Fats Butter or margarine; vegetable oils; unsalted salad dressings, regular salad dressings limited to 1 Tbs; light, sour and heavy cream Regular salad dressings containing bacon fat, bacon bits, and salt pork; snack dips made with instant soup mixes or processed cheese; salted nuts  Fruits Most fresh, frozen and canned fruits Fruits processed with salt or sodium-containing ingredient (some dried fruits are processed with sodium sulfites        Vegetables Fresh, frozen vegetables and low- sodium canned vegetables Regular canned vegetables, sauerkraut, pickled vegetables, and others prepared in brine; frozen vegetables in sauces; vegetables seasoned with ham, bacon or salt pork  Condiments, Sauces, Miscellaneous  Salt substitute with physician's approval; pepper, herbs, spices; vinegar, lemon or lime juice; hot pepper sauce; garlic powder, onion powder, low sodium soy sauce (1 Tbs.); low sodium condiments (  ketchup, chili sauce, mustard) in limited amounts (1 tsp.) fresh ground horseradish; unsalted tortilla chips, pretzels, potato chips, popcorn, salsa (1/4 cup) Any seasoning made with salt including garlic salt, celery salt, onion salt, and seasoned salt; sea salt, rock salt, kosher salt; meat tenderizers; monosodium glutamate; mustard, regular soy sauce, barbecue, sauce, chili sauce, teriyaki sauce, steak sauce, Worcestershire sauce, and most flavored vinegars; canned gravy and mixes; regular condiments; salted snack foods, olives, picles, relish, horseradish sauce, catsup   Food preparation: Try these seasonings Meats:    Pork  Sage, onion Serve with applesauce  Chicken Poultry seasoning, thyme, parsley Serve with cranberry sauce  Lamb Curry powder, rosemary, garlic, thyme Serve with mint sauce or jelly  Veal Marjoram, basil Serve with current jelly, cranberry sauce  Beef Pepper, bay leaf Serve with dry mustard, unsalted chive butter  Fish Bay leaf, dill Serve with unsalted lemon butter, unsalted parsley butter  Vegetables:    Asparagus Lemon juice   Broccoli Lemon juice   Carrots Mustard dressing parsley, mint, nutmeg, glazed with unsalted butter and sugar   Green beans Marjoram, lemon juice, nutmeg,dill seed   Tomatoes Basil, marjoram, onion   Spice /blend for [employer]" 4 tsp ground thyme 1 tsp ground sage 3 tsp ground rosemary 4 tsp ground marjoram   Test your knowledge 1. A product that says "Salt Free" may still contain sodium. True or False 2. Garlic Powder and Hot Pepper Sauce an be used as alternative seasonings.True or False 3. Processed foods have more sodium than fresh foods.  True or False 4. Canned Vegetables have less sodium than froze True or False  WAYS TO DECREASE YOUR SODIUM INTAKE 1. Avoid the use of added salt in cooking and at the table.  Table salt (and other prepared seasonings which contain salt) is probably one of the greatest sources of sodium in the diet.  Unsalted foods can gain flavor from the sweet, sour, and butter taste sensations of herbs and spices.  Instead of using salt for seasoning, try the following seasonings with the foods listed.  Remember: how you use them to enhance natural food flavors is limited only by your creativity... Allspice-Meat, fish, eggs, fruit, peas, red and yellow vegetables Almond Extract-Fruit baked goods Anise Seed-Sweet breads, fruit, carrots, beets, cottage cheese, cookies (tastes like licorice) Basil-Meat, fish, eggs, vegetables, rice, vegetables salads, soups, sauces Bay Leaf-Meat, fish, stews, poultry Burnet-Salad, vegetables (cucumber-like  flavor) Caraway Seed-Bread, cookies, cottage cheese, meat, vegetables, cheese, rice Cardamon-Baked goods, fruit, soups Celery Powder or seed-Salads, salad dressings, sauces, meatloaf, soup, bread.Do not use  celery salt Chervil-Meats, salads, fish, eggs, vegetables, cottage cheese (parsley-like flavor) Chili Power-Meatloaf, chicken cheese, corn, eggplant, egg dishes Chives-Salads cottage cheese, egg dishes, soups, vegetables, sauces Cilantro-Salsa, casseroles Cinnamon-Baked goods, fruit, pork, lamb, chicken, carrots Cloves-Fruit, baked goods, fish, pot roast, green beans, beets, carrots Coriander-Pastry, cookies, meat, salads, cheese (lemon-orange flavor) Cumin-Meatloaf, fish,cheese, eggs, cabbage,fruit pie (caraway flavor) [employer], fruit, eggs, fish, poultry, cottage cheese, vegetables Dill Seed-Meat, cottage cheese, poultry, vegetables, fish, salads, bread Fennel Seed-Bread, cookies, apples, pork, eggs, fish, beets, cabbage, cheese, Licorice-like flavor Garlic-(buds or powder) Salads, meat, poultry, fish, bread, butter, vegetables, potatoes.Do not  use garlic salt Ginger-Fruit, vegetables, baked goods, meat, fish, poultry Horseradish Root-Meet, vegetables, butter Lemon Juice or Extract-Vegetables, fruit, tea, baked goods, fish salads Mace-Baked goods fruit, vegetables, fish, poultry (taste like nutmeg) Maple Extract-Syrups Marjoram-Meat, chicken, fish, vegetables, breads, green salads (taste like Sage) Mint-Tea, lamb, sherbet, vegetables, desserts, carrots, cabbage Mustard, Dry or Seed-Cheese,  eggs, meats, vegetables, poultry Nutmeg-Baked goods, fruit, chicken, eggs, vegetables, desserts Onion Powder-Meat, fish, poultry, vegetables, cheese, eggs, bread, rice salads (Do not use   Onion salt) Orange Extract-Desserts, baked goods Oregano-Pasta, eggs, cheese, onions, pork, lamb, fish, chicken, vegetables, green salads Paprika-Meat, fish, poultry, eggs, cheese, vegetables Parsley  Flakes-Butter, vegetables, meat fish, poultry, eggs, bread, salads (certain forms may   Contain sodium Pepper-Meat fish, poultry, vegetables, eggs Peppermint Extract-Desserts, baked goods Poppy Seed-Eggs, bread, cheese, fruit dressings, baked goods, noodles, vegetables, cottage  [employer], poultry, meat, fish, cauliflower, turnips,eggs bread Saffron-Rice, bread, veal, chicken, fish, eggs Sage-Meat, fish, poultry, onions, eggplant, tomateos, pork, stews Savory-Eggs, salads, poultry, meat, rice, vegetables, soups, pork Tarragon-Meat, poultry, fish, eggs, butter, vegetables (licorice-like flavor)  Thyme-Meat, poultry, fish, eggs, vegetables, (clover-like flavor), sauces, soups Tumeric-Salads, butter, eggs, fish, rice, vegetables (saffron-like flavor) Vanilla Extract-Baked goods, candy Vinegar-Salads, vegetables, meat marinades Walnut Extract-baked goods, candy  2. Choose your Foods Wisely   The following is a list of foods to avoid which are high in sodium:  Meats-Avoid all smoked, canned, salt cured, dried and kosher meat and fish as well as Anchovies   [employer] Rx meats:Bologna, Liverwurst, Pastrami Canned meat or fish  Marinated herring Caviar    Pepperoni Corned Beef   Pizza Dried chipped beef  Salami Frozen breaded fish or meat Salt pork Frankfurters or hot dogs  Sardines Gefilte fish   Sausage Ham (boiled ham, Proscuitto Smoked butt    spiced ham)   Spam      TV Dinners Vegetables Canned vegetables (Regular) Relish Canned mushrooms  Sauerkraut Olives    Tomato juice Pickles  Bakery and Dessert Products Canned puddings  Cream pies Cheesecake   Decorated cakes Cookies  Beverages/Juices Tomato juice, regular  Gatorade   V-8 vegetable juice, regular  Breads and Cereals Biscuit mixes   Salted potato chips, corn chips, pretzels Bread stuffing mixes  Salted crackers and rolls Pancake and waffle mixes Self-rising  flour  Seasonings Accent    Meat sauces Barbecue sauce  Meat tenderizer Catsup    Monosodium glutamate (MSG) Celery salt   Onion salt Chili sauce   Prepared mustard Garlic salt   Salt, seasoned salt, sea salt Gravy mixes   Soy sauce Horseradish   Steak sauce Ketchup   Tartar sauce Lite salt    Teriyaki sauce Marinade mixes   Worcestershire sauce  Others Baking powder   Cocoa and cocoa mixes Baking soda   Commercial casserole mixes Candy-caramels, chocolate  Dehydrated soups    Bars, fudge,nougats  Instant rice and pasta mixes Canned broth or soup  Maraschino cherries Cheese, aged and processed cheese and cheese spreads  Learning Assessment Quiz  Indicated T (for True) or F (for False) for each of the following statements:  1. _____ Fresh fruits and vegetables and unprocessed grains are generally low in sodium 2. _____ Water may contain a considerable amount of sodium, depending on the source 3. _____ You can always tell if a food is high in sodium by tasting it 4. _____ Certain laxatives my be high in sodium and should be avoided unless prescribed   by a physician or pharmacist 5. _____ Salt substitutes may be used freely by anyone on a sodium restricted diet 6. _____ Sodium is present in table salt, food additives and as a natural component of   most foods 7. _____ Table salt is approximately 90% sodium 8. _____ Limiting sodium intake may help prevent excess fluid accumulation  in the body 9. _____ On a sodium-restricted diet, seasonings such as bouillon soy sauce, and    cooking wine should be used in place of table salt 10. _____ On an ingredient list, a product which lists monosodium glutamate as the first   ingredient is an appropriate food to include on a low sodium diet  Circle the best answer(s) to the following statements (Hint: there may be more than one correct answer)  11. On a low-sodium diet, some acceptable snack items are:    A. Olives  F. Bean dip   K.  Grapefruit juice    B. Salted Pretzels G. Commercial Popcorn   L. Canned peaches    C. Carrot Sticks  H. Bouillon   M. Unsalted nuts   D. [place] fries  I. Peanut butter crackers N. Salami   E. Sweet pickles J. Tomato Juice   O. Pizza  12.  Seasonings that may be used freely on a reduced - sodium diet include   A. Lemon wedges F.Monosodium glutamate K. Celery seed    B.Soysauce   G. Pepper   L. Mustard powder   C. Sea salt  H. Cooking wine  M. Onion flakes   D. Vinegar  E. Prepared horseradish N. Salsa   E. Sage   J. Worcestershire sauce  O. Chutney

## 2018-06-08 NOTE — Progress Notes (Signed)
Pt was started on Eliquis 5mg  twice a day for Afib on 11/10/17 by Dr. Harrington Challenger.     Reviewed patients medication list.  Pt is not currently on any combined P-gp and strong CYP3A4 inhibitors/inducers (ketoconazole, traconazole, ritonavir, carbamazepine, phenytoin, rifampin, St. John's wort).  Reviewed labs: SCr-1.07, Hgb-14.0, HCT-42.0, Weight-128.9kg.  Dose appropriate based on age, weight, and SCr.  Hgb and HCT within normal limits.  A full discussion of the nature of anticoagulants has been carried out.  A benefit/risk analysis has been presented to the patient, so that they understand the justification for choosing anticoagulation with Eliquis at this time.  The need for compliance is stressed.  Pt is aware to take the medication twice daily.  Side effects of potential bleeding are discussed, including unusual colored urine or stools, coughing up blood or coffee ground emesis, nose bleeds or serious fall or head trauma.  Discussed signs and symptoms of stroke. The patient should avoid any OTC items containing aspirin or ibuprofen.  Avoid alcohol consumption.   Call if any signs of abnormal bleeding.  Discussed financial obligations and resolved any difficulty in obtaining medication.    Pt states his cost has increased this year but plans to remain on Eliquis 5mg  and he does not plan to use alternative therapy such as Warfarin. Labs resulted and reviewed; pt to continue taking Eliquis 5mg  twice a day. Per MD pt lasix is being changed and will have a repeat BMET done in 1 week. Pt aware labs need to be repeated annually and as needed per MD.

## 2018-06-12 ENCOUNTER — Telehealth: Payer: Self-pay | Admitting: Internal Medicine

## 2018-06-12 DIAGNOSIS — I5032 Chronic diastolic (congestive) heart failure: Secondary | ICD-10-CM

## 2018-06-12 DIAGNOSIS — I25709 Atherosclerosis of coronary artery bypass graft(s), unspecified, with unspecified angina pectoris: Secondary | ICD-10-CM

## 2018-06-12 MED ORDER — FUROSEMIDE 40 MG PO TABS: ORAL_TABLET | ORAL | 3 refills | Status: DC

## 2018-06-12 NOTE — Telephone Encounter (Signed)
New Message          Patient's daughter called and has questions about increase of potassium medication, also increasing the fluid medication and when should he return for labs

## 2018-06-12 NOTE — Telephone Encounter (Signed)
Spoke with the pts daughter and she reports that the pt is agreeing to increase his lasix and will return next week on Friday late morning for a BMET. She will continue to weigh him and follow his edema and let us know if anything worsens.

## 2018-06-18 ENCOUNTER — Ambulatory Visit: Payer: Medicare Other | Admitting: Allergy

## 2018-06-19 ENCOUNTER — Other Ambulatory Visit: Payer: Medicare Other | Admitting: *Deleted

## 2018-06-19 ENCOUNTER — Other Ambulatory Visit: Payer: Self-pay | Admitting: *Deleted

## 2018-06-19 DIAGNOSIS — I25709 Atherosclerosis of coronary artery bypass graft(s), unspecified, with unspecified angina pectoris: Secondary | ICD-10-CM | POA: Diagnosis not present

## 2018-06-19 DIAGNOSIS — I5032 Chronic diastolic (congestive) heart failure: Secondary | ICD-10-CM | POA: Diagnosis not present

## 2018-06-19 LAB — BASIC METABOLIC PANEL
BUN/Creatinine Ratio: 19 (ref 10–24)
BUN: 27 mg/dL (ref 8–27)
CO2: 26 mmol/L (ref 20–29)
Calcium: 9 mg/dL (ref 8.6–10.2)
Chloride: 95 mmol/L — ABNORMAL LOW (ref 96–106)
Creatinine, Ser: 1.45 mg/dL — ABNORMAL HIGH (ref 0.76–1.27)
GFR calc Af Amer: 50 mL/min/{1.73_m2} — ABNORMAL LOW (ref >59)
GFR calc non Af Amer: 43 mL/min/{1.73_m2} — ABNORMAL LOW (ref >59)
Glucose: 89 mg/dL (ref 65–99)
Potassium: 4.2 mmol/L (ref 3.5–5.2)
Sodium: 138 mmol/L (ref 134–144)

## 2018-06-19 MED ORDER — FUROSEMIDE 40 MG PO TABS: 80.0000 mg | ORAL_TABLET | Freq: Every day | ORAL | 3 refills | Status: DC

## 2018-06-22 NOTE — Telephone Encounter (Signed)
Patient's daughter was informed of most recent lab results.  See lab encounter for details.

## 2018-06-30 ENCOUNTER — Encounter: Payer: Self-pay | Admitting: Internal Medicine

## 2018-07-10 ENCOUNTER — Telehealth: Payer: Self-pay | Admitting: *Deleted

## 2018-07-10 NOTE — Telephone Encounter (Signed)
[name] (daughter) calling. Mr. Michelle complained that his pacemaker beeped several times yesterday. He feels well. I advised that they send a transmission from his home monitor today and that I would call back upon receipt and review. She verbalizes understanding and is appreciative.

## 2018-07-10 NOTE — Telephone Encounter (Signed)
Levi [name] having trouble sending the transmission, I helped him through it. Transmission received, no alerts. Known AF, battery and lead measurements stable. I advised Levi [name] that it could be his cell phone or hearing aids beeping. He verbalizes understanding.  I called [name] back to make her aware of findings, she is appreciative of call.

## 2018-07-21 DIAGNOSIS — Z23 Encounter for immunization: Secondary | ICD-10-CM | POA: Diagnosis not present

## 2018-08-07 ENCOUNTER — Telehealth: Payer: Self-pay | Admitting: Internal Medicine

## 2018-08-07 NOTE — Telephone Encounter (Signed)
Melatonin is probably OK I am not sure about valerian root   Defer to pharmacy

## 2018-08-07 NOTE — Telephone Encounter (Signed)
New message   Pt c/o medication issue:  1. Name of Medication: melatonin and valerian root   2. How are you currently taking this medication (dosage and times per day)? n/a  3. Are you having a reaction (difficulty breathing--STAT)? n/a  4. What is your medication issue? Patient daughter wants to know if patient can take this medication for sleep. Please advise.

## 2018-08-07 NOTE — Telephone Encounter (Signed)
New Message:   Patient calling stating he is having trouble sleeping at night. He states that he is up about every hour or two. plesse call patient.

## 2018-08-07 NOTE — Telephone Encounter (Signed)
Levi [name] pt's daughter called asking if pt can take Melatonin or Valerian root an herbal medication to give pt at bedtime to help him go to sleep. daughter wants to know if that medication would interfered with other medications pt is taking. Daughter said that a year ago this time their mother his wife died, and that may be in his mind. Pt has not been able to go to sleep well during the night for the last 4 days. Pt states sometimes he falls asleep to wake up a 2 AM. Daughter is aware.

## 2018-08-07 NOTE — Telephone Encounter (Signed)
Pt called stating that he has been having problems going to  sleep for the last 4 nights. Pt said that he has been going to bed about 10 PM. Pt states that he does not drink nothing but water in the evenings. Pt denies any other symptoms. Pt states he feels fine other then not been able to sleep much. Pt was made aware that he needs to call his PCP to see if he can recommends something that makes him fall sleep at night. Pt verbalized understanding.

## 2018-08-10 NOTE — Telephone Encounter (Signed)
OK to try melatonin  Valerian may have excess drowsiness   I would be careful if tried

## 2018-08-10 NOTE — Telephone Encounter (Signed)
Left detailed message (DPR) of recommendations below. Adv to call back if question.

## 2018-08-10 NOTE — Telephone Encounter (Signed)
No drug interactions with current cardiac medications. Valerian root can be CNS depressant so pt should monitor for drowsiness.

## 2018-08-14 ENCOUNTER — Encounter: Payer: Self-pay | Admitting: Cardiology

## 2018-08-19 ENCOUNTER — Telehealth: Payer: Self-pay | Admitting: Internal Medicine

## 2018-08-19 ENCOUNTER — Ambulatory Visit (INDEPENDENT_AMBULATORY_CARE_PROVIDER_SITE_OTHER): Payer: Medicare Other | Admitting: Physician Assistant

## 2018-08-19 ENCOUNTER — Encounter: Payer: Self-pay | Admitting: Physician Assistant

## 2018-08-19 VITALS — BP 110/64 | HR 70 | Ht 69.0 in | Wt 281.9 lb

## 2018-08-19 DIAGNOSIS — I441 Atrioventricular block, second degree: Secondary | ICD-10-CM | POA: Diagnosis not present

## 2018-08-19 DIAGNOSIS — I25119 Atherosclerotic heart disease of native coronary artery with unspecified angina pectoris: Secondary | ICD-10-CM

## 2018-08-19 DIAGNOSIS — I5033 Acute on chronic diastolic (congestive) heart failure: Secondary | ICD-10-CM

## 2018-08-19 DIAGNOSIS — E785 Hyperlipidemia, unspecified: Secondary | ICD-10-CM | POA: Diagnosis not present

## 2018-08-19 DIAGNOSIS — I480 Paroxysmal atrial fibrillation: Secondary | ICD-10-CM | POA: Diagnosis not present

## 2018-08-19 DIAGNOSIS — I10 Essential (primary) hypertension: Secondary | ICD-10-CM | POA: Diagnosis not present

## 2018-08-19 DIAGNOSIS — I25810 Atherosclerosis of coronary artery bypass graft(s) without angina pectoris: Secondary | ICD-10-CM

## 2018-08-19 MED ORDER — TORSEMIDE 20 MG PO TABS: ORAL_TABLET | ORAL | 3 refills | Status: DC

## 2018-08-19 MED ORDER — TORSEMIDE 20 MG PO TABS: 20.0000 mg | ORAL_TABLET | Freq: Two times a day (BID) | ORAL | 3 refills | Status: DC

## 2018-08-19 MED ORDER — POTASSIUM CHLORIDE CRYS ER 20 MEQ PO TBCR: 20.0000 meq | EXTENDED_RELEASE_TABLET | Freq: Two times a day (BID) | ORAL | 3 refills | Status: DC

## 2018-08-19 NOTE — Telephone Encounter (Signed)
Extreme burning/redness/swelling in ankles. More SOB CPap machine sitting in chair to try to help with his breathing.  She spoke to him on the phone and couldn't tell if he sounded SOB because he didn't take it off. He told her Sunday night ankles started swelling and so he added an extra 40 lasix (in pm) to his regimen.  Did same Mon and Tue.  Recommended pt be seen in ED for breathing concerns.  She asked if he could be seen in office instead because taking an 84 yo to the ER "will freak him out". Scheduled with Kathleen Argue, PA-C today at 3:15. Adv daughter to call pt back and speak with him with CPAP off and if he sounds SOB on phone he needs to be taken by EMS to hospital.   She is in agreement with this plan. Called patient and informed of the above.  He said he "gives out easy with any activity".  Not urinating as much as he thinks he should.  Made pt aware of appointment this afternoon.

## 2018-08-19 NOTE — Telephone Encounter (Signed)
New Message    Pt c/o swelling: STAT is pt has developed SOB within 24 hours  1) How much weight have you gained and in what time span? Not sure  2) If swelling, where is the swelling located? Ankles with burning  3) Are you currently taking a fluid pill? yes  4) Are you currently SOB? no  5) Do you have a log of your daily weights (if so, list)? Not sure  6) Have you gained 3 pounds in a day or 5 pounds in a week? no  7) Have you traveled recently? no

## 2018-08-19 NOTE — Patient Instructions (Signed)
Medication Instructions:  Your physician has recommended you make the following change in your medication:  1. STOP LASIX.  2. START TORSEMIDE 20 MG: TAKE 40 MG (2 TABLETS) IN THE AM AND 20 MG (1 TABLET) IN THE PM, THEN TAKE 20 MG TWICE A DAY.  3. INCREASE POTASSIUM TO 20 MEQ TWICE DAILY.  If you need a refill on your cardiac medications before your next appointment, please call your pharmacy.   Lab work: TODAY: BMET, CBC. PRO BNP  TO BE DONE IN 1 WEEK: BMET  If you have labs (blood work) drawn today and your tests are completely normal, you will receive your results only by: Marland Kitchen MyChart Message (if you have MyChart) OR . A paper copy in the mail If you have any lab test that is abnormal or we need to change your treatment, we will call you to review the results.  Testing/Procedures: Your physician has requested that you have an echocardiogram. Echocardiography is a painless test that uses sound waves to create images of your heart. It provides your doctor with information about the size and shape of your heart and how well your heart's chambers and valves are working. This procedure takes approximately one hour. There are no restrictions for this procedure.    Follow-Up: At [facility], you and your health needs are our priority.  As part of our continuing mission to provide you with exceptional heart care, we have created designated Provider Care Teams.  These Care Teams include your primary Cardiologist (physician) and Advanced Practice Providers (APPs -  Physician Assistants and Nurse Practitioners) who all work together to provide you with the care you need, when you need it. You will need a follow up appointment in:  2-3 weeks.  Please call our office 2 months in advance to schedule this appointment.  You may see [name], MD or one of the following Advanced Practice Providers on your designated Care Team: Richardson Dopp, PA-C  Any Other Special Instructions Will Be Listed Below  (If Applicable).   Echocardiogram An echocardiogram is a procedure that uses painless sound waves (ultrasound) to produce an image of the heart. Images from an echocardiogram can provide important information about:  Signs of coronary artery disease (CAD).  Aneurysm detection. An aneurysm is a weak or damaged part of an artery wall that bulges out from the normal force of blood pumping through the body.  Heart size and shape. Changes in the size or shape of the heart can be associated with certain conditions, including heart failure, aneurysm, and CAD.  Heart muscle function.  Heart valve function.  Signs of a past heart attack.  Fluid buildup around the heart.  Thickening of the heart muscle.  A tumor or infectious growth around the heart valves. Tell a health care provider about:  Any allergies you have.  All medicines you are taking, including vitamins, herbs, eye drops, creams, and over-the-counter medicines.  Any blood disorders you have.  Any surgeries you have had.  Any medical conditions you have.  Whether you are pregnant or may be pregnant. What are the risks? Generally, this is a safe procedure. However, problems may occur, including:  Allergic reaction to dye (contrast) that may be used during the procedure. What happens before the procedure? No specific preparation is needed. You may eat and drink normally. What happens during the procedure?   An IV tube may be inserted into one of your veins.  You may receive contrast through this tube. A  contrast is an injection that improves the quality of the pictures from your heart.  A gel will be applied to your chest.  A wand-like tool (transducer) will be moved over your chest. The gel will help to transmit the sound waves from the transducer.  The sound waves will harmlessly bounce off of your heart to allow the heart images to be captured in real-time motion. The images will be recorded on a computer. The  procedure may vary among health care providers and hospitals. What happens after the procedure?  You may return to your normal, everyday life, including diet, activities, and medicines, unless your health care provider tells you not to do that. Summary  An echocardiogram is a procedure that uses painless sound waves (ultrasound) to produce an image of the heart.  Images from an echocardiogram can provide important information about the size and shape of your heart, heart muscle function, heart valve function, and fluid buildup around your heart.  You do not need to do anything to prepare before this procedure. You may eat and drink normally.  After the echocardiogram is completed, you may return to your normal, everyday life, unless your health care provider tells you not to do that. This information is not intended to replace advice given to you by your health care provider. Make sure you discuss any questions you have with your health care provider. Document Released: 08/16/2000 Document Revised: 09/21/2016 Document Reviewed: 09/21/2016 Elsevier Interactive Patient Education  2019 [employer].

## 2018-08-19 NOTE — Progress Notes (Signed)
Cardiology Office Note:    Date:  08/19/2018   ID:  Levi [name], DOB 10/11/1928, MRN [id number]  PCP:  Levi Limbo, MD  Cardiologist:  Levi Carnes, MD   Electrophysiologist:  Levi Peru, MD   Referring MD: Levi Limbo, MD   Chief Complaint  Patient presents with  . Shortness of Breath  . Leg Swelling     History of Present Illness:    Levi [name] is a 84 y.o. male with coronary artery disease status post CABG in 2011 and drug-eluting stent x2 to the LAD in 2013, paroxysmal atrial fibrillation, symptomatic bradycardia with Mobitz 2 status post pacemaker implantation in January 7262, diastolic heart failure, hypertension, hyperlipidemia.  He was seen at [name] in 2018 for a second opinion.  Cardiac Catheterization in June 2018 demonstrated patent LIMA-LAD, 80% mid in-stent restenosis in LAD, 50 to 60% mid LCx stenosis, 70% ostial OM2 stenosis, 60% proximal RCA and 60% distal RCA stenoses.  All vein grafts were previously known to be occluded.  There were no targets for PCI medical therapy was recommended.  He was last seen in our office in October 2019 by Levi Perch, NP.  He was volume overloaded but declined increasing his diuretic.  He is on Apixaban for anticoagulation.   [name] returns for evaluation of shortness of breath and leg swelling.  He is here today with his wife.  He has had significant issues with shortness of breath for quite some time.  Shortness of breath prompted his heart catheterization at [name] last year.  His symptoms have gradually worsened over time.  Over the past week, he notes increasing lower extremity swelling.  He also has pain in his legs as well as discoloration.  He tried increasing his furosemide on his own without significant improvement.  He sleeps in a recliner secondary to orthopnea.  He uses a CPAP.  He does admit to episodes of paroxysmal nocturnal dyspnea.  He has not had chest discomfort or syncope.  He denies any bleeding  issues.  CHADS2-VASc=5 (age x 2, CHF, CAD, HTN).   Prior CV studies:   The following studies were reviewed today:  Cardiac catheterization 02/10/2017 ([name]) Coronary angiography reveals 3-vessel CAD.  LM is diffusely diseased with 50% distal stenosis.  LAD has a 80% mid instent restenosis,  LCX has serial 50-60% stenoses in the mid segment, OM2 is a small vessel and has a 70% ostial stenosis.  RCA has long diffuse 50-60% proximal and 60% distal stenosis.  LIMA to LAD is patent with a kink in the mid segment. Flow is normal.  LV-Gram reveals EF 50%. LVEDP is 8 mmHg.  SVG grafts are known to be chronically occluded, so not injected   Nuclear stress test 01/21/2017 ([name]) 1. Moderate to severe inferoapical inducible ischemia with regadenoson 2. Mild to moderate mid inferior and posterior basal defect. Cannot rule out prior infarct. 3. Mild to moderate hypokinesis of the inferior wall. Dyskinesia/akinesia of the apex. 4. Moderately decreased left ventricular ejection fraction at 43%. 5. Please correlate with a cardiac ultrasound  Echo 06/20/2016 EF 03-55, grade 1 diastolic dysfunction, MAC, moderate LAE  Echo 09/28/2015 Mild concentric LVH, EF 55-60, normal wall motion, MAC, mild MR, severe LAE, normal RVSF, mild RAE, mild TR, PASP 41, GLS -14.8%  Exercise tolerance test 09/27/2015  Poor exercise capacity.   Test stopped early as HR 58 >> 33 and patient was weak. ECGs demonstrate transient complete  heart block.  Dr. Cristopher [name] saw patient and discussed proceeding with pacemaker implantation. Echo will be arranged.  Carotid US 09/14/2015 Bilateral ICA 1-39; partial left subclavian steal >> follow-up as needed  Nuclear stress test 11/29/2014 Low risk stress nuclear study with a moderate size, medium intensity, fixed inferior/apical defect consistent with prior infarct; no significant ischemia.  LV Ejection Fraction:  56%.  Echo (10/15):   Mild LVH, EF 55-60%, no RWMA, mod LAE, mild TR, PASP 42 mmHg  Nuclear (4/15):   Low risk stress nuclear study with a fixed defect in the apical anterior, inferior walls and in the true apex consistent with distal LAD scar. No ischemia. EF 56%  LHC (12/13):   Dist LM 60-70%, ostial LAD 40-50%, D1 occluded, prox LAD 80% then aneurysmal segment then 80%, ostial septal perforator 99%, prox CFX 50%, OM branch serial 60% lesions, prox RCA 30%, PL Br 50%; LIMA-LAD patent, SVG-LAD occluded, SVG-OM occluded, SVG-PDA occluded >> Med Rx >> continued angina >>  PCI (2/14):  2.25 x 32 mm Promus DES and 2.5 x 20 mm Promus DES to prox and mid LAD   Past Medical History:  Diagnosis Date  . Asthma   . Benign neoplasm of colon   . CAD (coronary artery disease) Dec 2011   s/p CABG per Dr. Roxy [name]; had normal EF; All SVGs occluded per follow up cath with only LIMA to LAD patent; s/p PCI of the proximal and mid LAD February 2014 per Dr. Burt [name]  . Dyspnea on exertion   . Edema   . Emphysema    "never treated for it" (10/05/2015)  . History of hiatal hernia   . HLD (hyperlipidemia)   . HTN (hypertension)   . Malaise and fatigue   . Obesity   . Osteopenia   . Pneumonia 1960s X 1; 1990s X 1  . Presence of permanent cardiac pacemaker   . Prostate cancer ([name])   . Skin cancer    "face, nose, top of ears, scalp"  . Symptomatic bradycardia    STJ PPM, 10/05/15, Dr. Lovena [name]   Surgical Hx: The patient  has a past surgical history that includes Median sternotomy (08/23/10); Anterior cervical decomp/discectomy fusion; left heart catheterization with coronary/graft angiogram (N/A, 09/01/2012); percutaneous coronary stent intervention (pci-s) (N/A, 10/14/2012); Insert / replace / remove pacemaker (10/05/2015); Back surgery; Mohs surgery ("@ least twice"); Prostate biopsy; Transurethral resection of prostate; Coronary artery bypass graft (08/23/10); Coronary angioplasty with stent (10/14/12); Cataract  extraction (Right); Pacemaker insertion; Cardiac catheterization (N/A, 10/05/2015); Cardiac catheterization (N/A, 10/06/2015); Eye surgery; and c-eye surgery procedure.   Current Medications: Current Meds  Medication Sig  . apixaban (ELIQUIS) 5 MG TABS tablet Take 1 tablet (5 mg total) by mouth 2 (two) times daily. To start when instructed after eye surgery  . carvedilol (COREG) 6.25 MG tablet Take 1 tablet (6.25 mg total) by mouth 2 (two) times daily.  . Melatonin 5 MG TABS Take 5 mg by mouth as needed.  . nitroGLYCERIN (NITROSTAT) 0.4 MG SL tablet Place 0.4 mg under the tongue every 5 (five) minutes x 3 doses as needed for chest pain.  . polyethylene glycol powder (GLYCOLAX/MIRALAX) powder Take 1 Container by mouth daily.   . pravastatin (PRAVACHOL) 20 MG tablet TAKE 1 TABLET BY MOUTH  DAILY  . tamsulosin (FLOMAX) 0.4 MG CAPS capsule Take 0.4 mg by mouth daily.  . [DISCONTINUED] furosemide (LASIX) 40 MG tablet Take 2 tablets (80 mg total) by mouth daily.  . [DISCONTINUED] ipratropium (ATROVENT)  0.06 % nasal spray Place 2 sprays into both nostrils 3 (three) times daily.  . [DISCONTINUED] potassium chloride SA (K-DUR,KLOR-CON) 20 MEQ tablet Take 1 tablet (20 mEq total) by mouth daily.     Allergies:   Patient has no known allergies.   Social History   Tobacco Use  . Smoking status: Former Smoker    Packs/day: 1.00    Years: 30.00    Pack years: 30.00    Types: Cigarettes    Last attempt to quit: 09/07/1975    Years since quitting: 42.9  . Smokeless tobacco: Former Systems developer    Types: Chew    Quit date: 07/27/2015  Substance Use Topics  . Alcohol use: No    Alcohol/week: 0.0 standard drinks  . Drug use: No     Family Hx: The patient's family history includes Heart attack in his father; Heart disease in his brother; Hypertension in his mother; Kidney cancer in his father; Prostate cancer in his brother; Skin cancer in his brother; Stroke in his mother. There is no history of Allergic  rhinitis, Asthma, Eczema, or Urticaria.  ROS:   Please see the history of present illness.    Review of Systems  Constitution: Positive for diaphoresis.  Cardiovascular: Positive for dyspnea on exertion and leg swelling.  Musculoskeletal: Positive for joint pain.   All other systems reviewed and are negative.   EKGs/Labs/Other Test Reviewed:    EKG:  EKG is  ordered today.  The ekg ordered today demonstrates V paced, HR 70  Recent Labs: 03/04/2018: NT-Pro BNP 1,969 06/08/2018: Hemoglobin 14.0; Platelets 125 06/19/2018: BUN 27; Creatinine, Ser 1.45; Potassium 4.2; Sodium 138  Hemoglobin 14.000 G/ 06/08/2018 Creatinine, Serum 1.450 06/19/2018 Potassium 4.200 06/19/2018  Recent Lipid Panel Lab Results  Component Value Date/Time   CHOL 113 06/01/2014 09:30 AM   TRIG 115.0 06/01/2014 09:30 AM   HDL 32.40 (L) 06/01/2014 09:30 AM   CHOLHDL 3 06/01/2014 09:30 AM   LDLCALC 58 06/01/2014 09:30 AM   LDLDIRECT 70.7 06/10/2013 01:44 PM    Physical Exam:    VS:  BP 110/64   Pulse 70   Ht _0  (1.753 m)   Wt 281 lb 14.1 oz (127.9 kg)   SpO2 97%   BMI 41.63 kg/m     Wt Readings from Last 3 Encounters:  08/19/18 281 lb 14.1 oz (127.9 kg)  06/08/18 284 lb (128.8 kg)  02/26/18 280 lb 12.8 oz (127.4 kg)     Physical Exam  Constitutional: He is oriented to person, place, and time. He appears well-developed and well-nourished. No distress.  HENT:  Head: Normocephalic and atraumatic.  Eyes: No scleral icterus.  Neck: No thyromegaly present.  Cardiovascular: Normal rate and regular rhythm.  No murmur heard. Pulmonary/Chest: He has no wheezes. He has rales in the right lower field and the left lower field.  Musculoskeletal:        General: Edema (1-2+ bilat [name] edema) present.  Lymphadenopathy:    He has no cervical adenopathy.  Neurological: He is alert and oriented to person, place, and time.  Skin: Skin is warm and dry.  Psychiatric: He has a normal mood and affect.     ASSESSMENT & PLAN:    Acute on Chronic diastolic heart failure (HCC) He has evidence of volume excess on exam.  His lower extremity swelling has worsened and he has rales on lung exam.  I cannot appreciate JVD.  In any event, I suspect his shortness of breath is multifactorial  and related to heart failure, coronary artery disease, obesity, prior smoking and deconditioning.  At this point, I have recommended changing his furosemide to torsemide to promote better diuresis.  I have also recommended proceeding with an echocardiogram to reassess his LV function, RV function and pulmonary pressures.  Over time, we could consider referral to the heart failure clinic and right-sided heart catheterization versus cardiopulmonary stress test.  If he has elevated pulmonary pressures on echocardiogram, consider referral back to pulmonology.  Of note, he had PFTs in 2017 that demonstrated restriction but no significant obstruction.  -DC Lasix  -Start torsemide 40 mg every morning and 20 mg every afternoon x2 days, then 20 mg twice daily  -Increase potassium to 20 mEq twice daily  -BMET, CBC, BNP today  -BMET 1 week  -Obtain 2D echocardiogram  -Follow-up with Dr. Harrington Challenger or me in 2 weeks   Coronary artery disease involving native coronary artery of native heart with angina pectoris [facility]) History of CABG in 2011 and drug-eluting stent x2 to the LAD in 2013.  Cardiac catheterization last year at [facility] demonstrated a patent LIMA-LAD and diffuse disease elsewhere.  All vein grafts were known to be occluded.  There were no targets for PCI.  Medical therapy was recommended.  It is possible that his anginal equivalent is shortness of breath.  I have recommended that we proceed with adjusting his diuresis initially.  At follow-up, consider starting long-acting nitrates.  At this point, I do not believe he needs left heart catheterization.  Stress testing would not likely be helpful.  He is not on aspirin as he is on  Apixaban.  Continue beta-blocker, statin.  -Consider Isosorbide MN at follow up appointment   Essential hypertension The patient's blood pressure is controlled on his current regimen.  Continue current therapy.   Hyperlipidemia, unspecified hyperlipidemia type Continue statin therapy.  Mobitz type 2 second degree atrioventricular block Status post pacemaker.  Follow-up with EP as planned.  PAF (paroxysmal atrial fibrillation) ([place])   He remains on Apixaban for anticoagulation.  Obtain BMET, CBC today.   Dispo:  Return in about 2 weeks (around 09/02/2018) for Close Follow Up, w/ Dr. Harrington Challenger, or Levi Dopp, PA-C.   Medication Adjustments/Labs and Tests Ordered: Current medicines are reviewed at length with the patient today.  Concerns regarding medicines are outlined above.  Tests Ordered: Orders Placed This Encounter  Procedures  . Basic metabolic panel  . CBC  . Pro b natriuretic peptide (BNP)  . Basic metabolic panel  . EKG 12-Lead  . ECHOCARDIOGRAM COMPLETE   Medication Changes: Meds ordered this encounter  Medications  . DISCONTD: torsemide (DEMADEX) 20 MG tablet    Sig: Take 1 tablet (20 mg total) by mouth 2 (two) times daily. Take 40 mg (2 tablets) in the am and 20 mg (1 tablet) in the PM for 2 days, then 20 mg twice daily.    Dispense:  180 tablet    Refill:  3  . potassium chloride SA (KLOR-CON M20) 20 MEQ tablet    Sig: Take 1 tablet (20 mEq total) by mouth 2 (two) times daily.    Dispense:  180 tablet    Refill:  3  . torsemide (DEMADEX) 20 MG tablet    Sig: Take 40 mg (2 tablets) in the am and 20 mg (1 tablet) in the PM for 2 days, then 20 mg twice daily.    Dispense:  180 tablet    Refill:  3  Signed, Levi Dopp, PA-C  08/19/2018 4:36 PM    [facility] [address], [place], [place]  [address] Phone: [phone]; Fax: [phone]

## 2018-08-20 ENCOUNTER — Telehealth: Payer: Self-pay | Admitting: Physician Assistant

## 2018-08-20 LAB — CBC
Hematocrit: 40.7 % (ref 37.5–51.0)
Hemoglobin: 13.8 g/dL (ref 13.0–17.7)
MCH: 31.2 pg (ref 26.6–33.0)
MCHC: 33.9 g/dL (ref 31.5–35.7)
MCV: 92 fL (ref 79–97)
PLATELETS: 148 10*3/uL — AB (ref 150–450)
RBC: 4.42 x10E6/uL (ref 4.14–5.80)
RDW: 13.4 % (ref 12.3–15.4)
WBC: 8.7 10*3/uL (ref 3.4–10.8)

## 2018-08-20 LAB — BASIC METABOLIC PANEL
BUN / CREAT RATIO: 19 (ref 10–24)
BUN: 23 mg/dL (ref 8–27)
CO2: 26 mmol/L (ref 20–29)
CREATININE: 1.19 mg/dL (ref 0.76–1.27)
Calcium: 8.9 mg/dL (ref 8.6–10.2)
Chloride: 97 mmol/L (ref 96–106)
GFR calc Af Amer: 63 mL/min/{1.73_m2} (ref >59)
GFR, EST NON AFRICAN AMERICAN: 55 mL/min/{1.73_m2} — AB (ref >59)
Glucose: 106 mg/dL — ABNORMAL HIGH (ref 65–99)
Potassium: 3.8 mmol/L (ref 3.5–5.2)
SODIUM: 139 mmol/L (ref 134–144)

## 2018-08-20 LAB — PRO B NATRIURETIC PEPTIDE: NT-Pro BNP: 1872 pg/mL — ABNORMAL HIGH (ref 0–486)

## 2018-08-20 NOTE — Telephone Encounter (Signed)
Pt's daughter Hosea Hanawalt called regarding pt's O/V yesterday with Richardson Dopp PA. Daughter would like to know if pt will have the results of the echo the same day that its done on 12/27. Also if pt needs to keep the appointment with Dr Harrington Challenger on 09/11/18. Daughter was made aware that  Dr. Harrington Challenger needs to review the echo results before given the results to pt. Also daughter is aware that pt needs to keep the appointment with Dr. Harrington Challenger, because MD probable will go over the results and recommendations. Daughter is aware that pt can call the office if needed prior O/With Dr. Harrington Challenger. Daughter  verbalized understanding.

## 2018-08-20 NOTE — Telephone Encounter (Signed)
New Message    Daughter called stating Dad had an appt yesterday and has questions about the appt. Please call

## 2018-08-21 ENCOUNTER — Telehealth: Payer: Self-pay

## 2018-08-21 NOTE — Telephone Encounter (Signed)
Spoke with pt's daughter [name] (DPR on file) who was made aware of lab results and recommendations. As we were speaking pt's daughter wrote down med instructions per Richardson Dopp, PA-C to take Torsemide 40 mg in the A and 20 in the P for 4 days instead of two days, then back to 20 mg BID and K+ 40 meq in the A and 20 meq in the P for 4 days, then back to 20 meq BID. [name] (pt's daughter) was also made aware to have pt keep lab appt on 12/27 to get bmet done per Richardson Dopp, PA-C recommendation. Pt's daughter verbalized understanding.   Pt's daughter stated that pt has appt with Dr. Harrington Challenger on 1/10 @ 2:20 pm but would like to request pt be seen by Richardson Dopp on that day instead. Pt's daughter states that pt really liked Richardson Dopp, PA-C. I made pt's daughter, [name] aware that we had an appt on the 1/10 @ 11:15 am. [name] asked if we could schedule him for that date and time but not cancel appt with Dr. Harrington Challenger until pt's daughter can see if a prior Dr. appt with Dermatology can be reschedule at a different time for that same day. Pt's daughter will call our office back if she can switch appts. [name] thanked me for the call.  While witting this telephone note, pt daughter [name] called back and wants to proceed with appt with Richardson Dopp, PA-C on 1/10 @ 11:15 am.  Appt for Dr. Harrington Challenger on 1/10 @ 2:20 pm has been cancelled.

## 2018-08-21 NOTE — Telephone Encounter (Signed)
-----   Message from Liliane Shi, [place] sent at 08/20/2018  8:52 AM EST ----- Kidney function (Creatinine), potassium are normal.  Blood count (hemoglobin) is normal.  The platelets are a little low but this is consistent with historical values (no significant change).  The BNP (congestive heart failure) is markedly elevated. At his visit yesterday, I asked him to take Torsemide 40 mg in the A and 20 mg in the P for 2 days, then take 20 mg BID.  I want to change this. Recommendations:  - Have him take Torsemide 40 mg in the AM and 20 mg in the PM for a total of 4 days  - After 4 days, reduce the dose of Torsemide to 20 mg Twice daily   - Take K+ 40 mEq in the AM and 20 mEq in the PM for 4 days  - After 4 days, reduce the K+ to 20 mEq Twice daily   - Keep lab appt 12/27 as scheduled (to repeat BMET) Richardson Dopp, PA-C    08/20/2018 8:45 AM

## 2018-08-28 ENCOUNTER — Ambulatory Visit (HOSPITAL_COMMUNITY): Payer: Medicare Other | Attending: Cardiovascular Disease

## 2018-08-28 ENCOUNTER — Other Ambulatory Visit: Payer: Self-pay

## 2018-08-28 ENCOUNTER — Other Ambulatory Visit: Payer: Medicare Other | Admitting: *Deleted

## 2018-08-28 DIAGNOSIS — I25119 Atherosclerotic heart disease of native coronary artery with unspecified angina pectoris: Secondary | ICD-10-CM

## 2018-08-28 DIAGNOSIS — I5033 Acute on chronic diastolic (congestive) heart failure: Secondary | ICD-10-CM

## 2018-08-28 DIAGNOSIS — I480 Paroxysmal atrial fibrillation: Secondary | ICD-10-CM | POA: Diagnosis not present

## 2018-08-28 DIAGNOSIS — I441 Atrioventricular block, second degree: Secondary | ICD-10-CM

## 2018-08-28 DIAGNOSIS — I10 Essential (primary) hypertension: Secondary | ICD-10-CM

## 2018-08-28 DIAGNOSIS — E785 Hyperlipidemia, unspecified: Secondary | ICD-10-CM | POA: Diagnosis not present

## 2018-08-28 LAB — BASIC METABOLIC PANEL
BUN/Creatinine Ratio: 20 (ref 10–24)
BUN: 25 mg/dL (ref 8–27)
CO2: 23 mmol/L (ref 20–29)
CREATININE: 1.26 mg/dL (ref 0.76–1.27)
Calcium: 8.8 mg/dL (ref 8.6–10.2)
Chloride: 98 mmol/L (ref 96–106)
GFR calc Af Amer: 59 mL/min/{1.73_m2} — ABNORMAL LOW (ref >59)
GFR calc non Af Amer: 51 mL/min/{1.73_m2} — ABNORMAL LOW (ref >59)
GLUCOSE: 96 mg/dL (ref 65–99)
Potassium: 4.2 mmol/L (ref 3.5–5.2)
Sodium: 137 mmol/L (ref 134–144)

## 2018-08-28 MED ORDER — PERFLUTREN LIPID MICROSPHERE
1.0000 mL | INTRAVENOUS | Status: AC | PRN
  Administered 2018-08-28: 3 mL via INTRAVENOUS

## 2018-09-11 ENCOUNTER — Ambulatory Visit: Payer: Medicare Other | Admitting: Internal Medicine

## 2018-09-11 ENCOUNTER — Ambulatory Visit (INDEPENDENT_AMBULATORY_CARE_PROVIDER_SITE_OTHER): Payer: Medicare Other | Admitting: Physician Assistant

## 2018-09-11 ENCOUNTER — Encounter: Payer: Self-pay | Admitting: Physician Assistant

## 2018-09-11 VITALS — BP 124/76 | HR 70 | Ht 69.0 in | Wt 281.4 lb

## 2018-09-11 DIAGNOSIS — I480 Paroxysmal atrial fibrillation: Secondary | ICD-10-CM | POA: Diagnosis not present

## 2018-09-11 DIAGNOSIS — M79604 Pain in right leg: Secondary | ICD-10-CM | POA: Diagnosis not present

## 2018-09-11 DIAGNOSIS — M79605 Pain in left leg: Secondary | ICD-10-CM

## 2018-09-11 DIAGNOSIS — I5043 Acute on chronic combined systolic (congestive) and diastolic (congestive) heart failure: Secondary | ICD-10-CM

## 2018-09-11 DIAGNOSIS — I10 Essential (primary) hypertension: Secondary | ICD-10-CM

## 2018-09-11 DIAGNOSIS — I25119 Atherosclerotic heart disease of native coronary artery with unspecified angina pectoris: Secondary | ICD-10-CM

## 2018-09-11 NOTE — Patient Instructions (Addendum)
Medication Instructions:  Your physician recommends that you continue on your current medications as directed. Please refer to the Current Medication list given to you today.  If you need a refill on your cardiac medications before your next appointment, please call your pharmacy.   Lab work: TODAY: BMET, BNP  If you have labs (blood work) drawn today and your tests are completely normal, you will receive your results only by: Marland Kitchen MyChart Message (if you have MyChart) OR . A paper copy in the mail If you have any lab test that is abnormal or we need to change your treatment, we will call you to review the results.  Testing/Procedures: NONE  Follow-Up: At [facility], you and your health needs are our priority.  As part of our continuing mission to provide you with exceptional heart care, we have created designated Provider Care Teams.  These Care Teams include your primary Cardiologist (physician) and Advanced Practice Providers (APPs -  Physician Assistants and Nurse Practitioners) who all work together to provide you with the care you need, when you need it. You will need a follow up appointment in:  6-8 weeks. You may see [name], MD or Richardson Dopp, PA-C WHEN DR. Harrington Challenger IS IN THE OFFICE.   Any Other Special Instructions Will Be Listed Below (If Applicable).

## 2018-09-11 NOTE — Progress Notes (Signed)
Cardiology Office Note:    Date:  09/11/2018   ID:  [name], DOB [birth year], MRN [id number]  PCP:  Levi Limbo, MD  Cardiologist:  Levi Carnes, MD   Electrophysiologist:  Levi Peru, MD   Referring MD: Levi Limbo, MD   Chief Complaint  Patient presents with  . Follow-up    CHF     History of Present Illness:    Levi [name] is a 85 y.o. male with coronary artery disease status post CABG in 2011 and drug-eluting stent x2 to the LAD in 2013, paroxysmal atrial fibrillation, symptomatic bradycardia with Mobitz 2 status post pacemaker implantation in January 9242, diastolic heart failure, hypertension, hyperlipidemia.  He was seen at [name] in 2018 for a second opinion.  Cardiac Catheterization in June 2018 demonstrated patent LIMA-LAD, 80% mid in-stent restenosis in LAD, 50 to 60% mid LCx stenosis, 70% ostial OM2 stenosis, 60% proximal RCA and 60% distal RCA stenoses.  All vein grafts were previously known to be occluded.  There were no targets for PCI and medical therapy was recommended.  He is on Apixaban for anticoagulation.  I saw him 08/19/18 for shortness of breath.  He had evidence of volume excess on exam and I changed his Furosemide to Torsemide.   Pro-BNP was 1872.     Mr. Shock returns for follow up.  He is here today with his daughter. His leg swelling is improved. His breathing remains the same.  He denies chest pain.  He sleeps with CPAP.  He has to sleep on an incline.  His echo was just read this AM.  His EF is down at 40-45 with inf WMA and mod diastolic dysfunction.    CHADS2-VASc=5 (age x 2, CHF, CAD, HTN).   Prior CV studies:   The following studies were reviewed today:  Echo 08/28/18 Mild conc LVH, EF 40-45, inf/inf-sept/apical HK, Gr 2 DD, MAC, normal RVSF, mod RAE, increased RA pressure, mild TR  Cardiac catheterization 02/10/2017 ([name]) Coronary angiography reveals 3-vessel CAD.  LM is diffusely diseased with 50% distal  stenosis.  LAD has a 80% mid instent restenosis,  LCX has serial 50-60% stenoses in the mid segment, OM2 is a small vessel and has a 70% ostial stenosis.  RCA has long diffuse 50-60% proximal and 60% distal stenosis.  LIMA to LAD is patent with a kink in the mid segment. Flow is normal.  LV-Gram reveals EF 50%. LVEDP is 8 mmHg.  SVG grafts are known to be chronically occluded, so not injected   Nuclear stress test 01/21/2017 ([name]) 1. Moderate to severe inferoapical inducible ischemia with regadenoson 2. Mild to moderate mid inferior and posterior basal defect. Cannot rule out prior infarct. 3. Mild to moderate hypokinesis of the inferior wall. Dyskinesia/akinesia of the apex. 4. Moderately decreased left ventricular ejection fraction at 43%. 5. Please correlate with a cardiac ultrasound  Echo 06/20/2016 EF 68-34, grade 1 diastolic dysfunction, MAC, moderate LAE  Echo 09/28/2015 Mild concentric LVH, EF 55-60, normal wall motion, MAC, mild MR, severe LAE, normal RVSF, mild RAE, mild TR, PASP 41, GLS -14.8%  Exercise tolerance test 09/27/2015  Poor exercise capacity.  Test stopped early as HR 58 >> 33 and patient was weak. ECGs demonstrate transient complete heart block.  Dr. Cristopher [name] saw patient and discussed proceeding with pacemaker implantation. Echo will be arranged.  Carotid US 09/14/2015 Bilateral ICA 1-39; partial left subclavian steal >> follow-up as needed  Nuclear  stress test 11/29/2014 Low risk stress nuclear study with a moderate size, medium intensity, fixed inferior/apical defect consistent with prior infarct; no significant ischemia.  LV Ejection Fraction: 56%.  Echo (10/15):  Mild LVH, EF 55-60%, no RWMA, mod LAE, mild TR, PASP 42 mmHg  Nuclear (4/15):  Low risk stress nuclear study with a fixed defect in the apical anterior, inferior walls and in the true apex consistent with distal LAD scar. No ischemia. EF 56% LHC  (12/13):  Dist LM 60-70%, ostial LAD 40-50%, D1 occluded, prox LAD 80% then aneurysmal segment then 80%, ostial septal perforator 99%, prox CFX 50%, OM branch serial 60% lesions, prox RCA 30%, PL Br 50%; LIMA-LAD patent, SVG-LAD occluded, SVG-OM occluded, SVG-PDA occluded >>Med Rx >>continued angina >> PCI (2/14): 2.25 x 32 mm Promus DES and 2.5 x 20 mm Promus DES to prox and mid LAD   Past Medical History:  Diagnosis Date  . Asthma   . Benign neoplasm of colon   . CAD (coronary artery disease) Dec 2011   s/p CABG per Dr. Roxy [name]; had normal EF; All SVGs occluded per follow up cath with only LIMA to LAD patent; s/p PCI of the proximal and mid LAD February 2014 per Dr. Burt Knack  . Dyspnea on exertion   . Edema   . Emphysema    "never treated for it" (10/05/2015)  . History of hiatal hernia   . HLD (hyperlipidemia)   . HTN (hypertension)   . Malaise and fatigue   . Obesity   . Osteopenia   . Pneumonia 1960s X 1; 1990s X 1  . Presence of permanent cardiac pacemaker   . Prostate cancer ([name])   . Skin cancer    "face, nose, top of ears, scalp"  . Symptomatic bradycardia    STJ PPM, 10/05/15, Dr. Lovena Le   Surgical Hx: The patient  has a past surgical history that includes Median sternotomy (08/23/10); Anterior cervical decomp/discectomy fusion; left heart catheterization with coronary/graft angiogram (N/A, 09/01/2012); percutaneous coronary stent intervention (pci-s) (N/A, 10/14/2012); Insert / replace / remove pacemaker (10/05/2015); Back surgery; Mohs surgery ("@ least twice"); Prostate biopsy; Transurethral resection of prostate; Coronary artery bypass graft (08/23/10); Coronary angioplasty with stent (10/14/12); Cataract extraction (Right); Pacemaker insertion; Cardiac catheterization (N/A, 10/05/2015); Cardiac catheterization (N/A, 10/06/2015); Eye surgery; and c-eye surgery procedure.   Current Medications: Current Meds  Medication Sig  . apixaban (ELIQUIS) 5 MG TABS tablet Take 1 tablet (5 mg  total) by mouth 2 (two) times daily. To start when instructed after eye surgery  . carvedilol (COREG) 6.25 MG tablet Take 1 tablet (6.25 mg total) by mouth 2 (two) times daily.  . Melatonin 5 MG TABS Take 5 mg by mouth as needed.  . nitroGLYCERIN (NITROSTAT) 0.4 MG SL tablet Place 0.4 mg under the tongue every 5 (five) minutes x 3 doses as needed for chest pain.  . polyethylene glycol powder (GLYCOLAX/MIRALAX) powder Take 1 Container by mouth daily.   . potassium chloride SA (KLOR-CON M20) 20 MEQ tablet Take 1 tablet (20 mEq total) by mouth 2 (two) times daily.  . pravastatin (PRAVACHOL) 20 MG tablet TAKE 1 TABLET BY MOUTH  DAILY  . tamsulosin (FLOMAX) 0.4 MG CAPS capsule Take 0.4 mg by mouth daily.  Marland Kitchen torsemide (DEMADEX) 20 MG tablet Take 40 mg (2 tablets) in the am and 20 mg (1 tablet) in the PM for 2 days, then 20 mg twice daily.     Allergies:   Patient has no known allergies.  Social History   Tobacco Use  . Smoking status: Former Smoker    Packs/day: 1.00    Years: 30.00    Pack years: 30.00    Types: Cigarettes    Last attempt to quit: 09/07/1975    Years since quitting: 43.0  . Smokeless tobacco: Former Systems developer    Types: Chew    Quit date: 07/27/2015  Substance Use Topics  . Alcohol use: No    Alcohol/week: 0.0 standard drinks  . Drug use: No     Family Hx: The patient's family history includes Heart attack in his father; Heart disease in his brother; Hypertension in his mother; Kidney cancer in his father; Prostate cancer in his brother; Skin cancer in his brother; Stroke in his mother. There is no history of Allergic rhinitis, Asthma, Eczema, or Urticaria.  ROS:   Please see the history of present illness.    Review of Systems  Cardiovascular: Positive for leg swelling.  Respiratory: Positive for shortness of breath.    All other systems reviewed and are negative.   EKGs/Labs/Other Test Reviewed:    EKG:  EKG is not ordered today.    Recent Labs: 08/19/2018:  Hemoglobin 13.8; NT-Pro BNP 1,872; Platelets 148 08/28/2018: BUN 25; Creatinine, Ser 1.26; Potassium 4.2; Sodium 137   Recent Lipid Panel Lab Results  Component Value Date/Time   CHOL 113 06/01/2014 09:30 AM   TRIG 115.0 06/01/2014 09:30 AM   HDL 32.40 (L) 06/01/2014 09:30 AM   CHOLHDL 3 06/01/2014 09:30 AM   LDLCALC 58 06/01/2014 09:30 AM   LDLDIRECT 70.7 06/10/2013 01:44 PM    Physical Exam:    VS:  BP 124/76   Pulse 70   Ht _0  (1.753 m)   Wt 281 lb 6.4 oz (127.6 kg)   SpO2 95%   BMI 41.56 kg/m     Wt Readings from Last 3 Encounters:  09/11/18 281 lb 6.4 oz (127.6 kg)  08/19/18 281 lb 14.1 oz (127.9 kg)  06/08/18 284 lb (128.8 kg)     Physical Exam  Constitutional: He is oriented to person, place, and time. He appears well-developed and well-nourished. No distress.  HENT:  Head: Normocephalic and atraumatic.  Eyes: No scleral icterus.  Neck: JVD present. No thyromegaly present.  Cardiovascular: Normal rate and regular rhythm.  No murmur heard. Pulmonary/Chest: Effort normal. He has no rales.  Abdominal: Soft.  Musculoskeletal:        General: Edema (trace bilat LE edema) present.  Lymphadenopathy:    He has no cervical adenopathy.  Neurological: He is alert and oriented to person, place, and time.  Skin: Skin is warm and dry.  Psychiatric: He has a normal mood and affect.    ASSESSMENT & PLAN:    Acute on chronic combined systolic and diastolic CHF (congestive heart failure) (HCC) EF is now 40-45 with inf WMA.  I reviewed his Echo with Dr. Harrington Challenger.  At this point, we do not think he needs an ischemic workup (ie Cardiac Catheterization).  Continue current dose of Torsemide.  Repeat BMET, BNP today.  Consider increasing Torsemide further. Continue current dose of beta-blocker.  BP could possibly tolerate addition of low dose ACE inhibitor (Lisinopril 2.5 mg).  This can be considered at follow up.  Continue compression stockings.  We received an order to sign for  pneumatic compression devices.  We did not order this and he has never had this at home.  We will cancel this order.  FU with Dr. Harrington Challenger  or me in 6-8 weeks.    Coronary artery disease involving native coronary artery of native heart with angina pectoris [facility])  History of CABG in 2011 and drug-eluting stent x2 to the LAD in 2013.  Cardiac catheterization last year at [name] demonstrated a patent LIMA-LAD and diffuse disease elsewhere.  All vein grafts were known to be occluded.  There were no targets for PCI.  Medical therapy was recommended.  As noted his EF is 40-45 now with inf WMA.  I reviewed with Dr. Harrington Challenger.  He is not having angina.  We will treat medically and defer repeat Cardiac Catheterization at this time.  Continue beta-blocker, statin. He is not on ASA as he is on Apixaban.  Essential hypertension  The patient's blood pressure is controlled on his current regimen.  Continue current therapy.    PAF (paroxysmal atrial fibrillation) (HCC)  Continue Apixaban. Recent creatinine and Hgb stable.  Pain in both lower extremities He describes symptoms of burning and his daughter is worried about neuropathy.  I have asked them to follow up with PCP for evaluation.     Dispo:  Return in about 8 weeks (around 11/06/2018) for Close Follow Up, w/ Dr. Harrington Challenger, or Richardson Dopp, PA-C.   Medication Adjustments/Labs and Tests Ordered: Current medicines are reviewed at length with the patient today.  Concerns regarding medicines are outlined above.  Tests Ordered: Orders Placed This Encounter  Procedures  . Basic metabolic panel  . Pro b natriuretic peptide (BNP)   Medication Changes: No orders of the defined types were placed in this encounter.   Signed, Richardson Dopp, PA-C  09/11/2018 1:17 PM    [facility] [place], [place], [place]  [address] Phone: [phone]; Fax: [phone]

## 2018-09-12 LAB — BASIC METABOLIC PANEL
BUN/Creatinine Ratio: 22 (ref 10–24)
BUN: 21 mg/dL (ref 8–27)
CALCIUM: 10.6 mg/dL — AB (ref 8.6–10.2)
CO2: 24 mmol/L (ref 20–29)
Chloride: 106 mmol/L (ref 96–106)
Creatinine, Ser: 0.96 mg/dL (ref 0.76–1.27)
GFR calc Af Amer: 82 mL/min/{1.73_m2} (ref >59)
GFR calc non Af Amer: 71 mL/min/{1.73_m2} (ref >59)
Glucose: 62 mg/dL — ABNORMAL LOW (ref 65–99)
POTASSIUM: 4.9 mmol/L (ref 3.5–5.2)
Sodium: 144 mmol/L (ref 134–144)

## 2018-09-12 LAB — PRO B NATRIURETIC PEPTIDE: NT-Pro BNP: 2599 pg/mL — ABNORMAL HIGH (ref 0–486)

## 2018-09-14 ENCOUNTER — Telehealth: Payer: Self-pay | Admitting: Physician Assistant

## 2018-09-14 DIAGNOSIS — I5030 Unspecified diastolic (congestive) heart failure: Secondary | ICD-10-CM

## 2018-09-14 DIAGNOSIS — I10 Essential (primary) hypertension: Secondary | ICD-10-CM

## 2018-09-14 DIAGNOSIS — I5043 Acute on chronic combined systolic (congestive) and diastolic (congestive) heart failure: Secondary | ICD-10-CM

## 2018-09-14 DIAGNOSIS — I25119 Atherosclerotic heart disease of native coronary artery with unspecified angina pectoris: Secondary | ICD-10-CM

## 2018-09-14 MED ORDER — POTASSIUM CHLORIDE CRYS ER 20 MEQ PO TBCR: EXTENDED_RELEASE_TABLET | ORAL | 3 refills | Status: DC

## 2018-09-14 MED ORDER — TORSEMIDE 20 MG PO TABS: 40.0000 mg | ORAL_TABLET | Freq: Two times a day (BID) | ORAL | 3 refills | Status: DC

## 2018-09-14 NOTE — Telephone Encounter (Signed)
Reviewed results with patient' daughter [name] (DPR on file) who verbalized understanding. Per Richardson Dopp, PA-C to increase Torsemide to 40 mg BID, increase K+ to 40 mEq in the A and 20 mEq in the p and repeat BMET in 1 week (lab appt 1/21). Pt's daughter thanked me for the call. Orders have been placed in epic.

## 2018-09-14 NOTE — Telephone Encounter (Signed)
New Message   Pts daughter is calling about the lab results from Friday 01/10 and is wondering if their needs to be a medication change, Please call

## 2018-09-15 ENCOUNTER — Telehealth: Payer: Self-pay | Admitting: Physician Assistant

## 2018-09-15 ENCOUNTER — Other Ambulatory Visit: Payer: Self-pay | Admitting: Internal Medicine

## 2018-09-15 MED ORDER — TORSEMIDE 20 MG PO TABS: 40.0000 mg | ORAL_TABLET | Freq: Two times a day (BID) | ORAL | 3 refills | Status: DC

## 2018-09-15 MED ORDER — POTASSIUM CHLORIDE CRYS ER 20 MEQ PO TBCR: EXTENDED_RELEASE_TABLET | ORAL | 3 refills | Status: DC

## 2018-09-15 NOTE — Telephone Encounter (Signed)
New Message    *STAT* If patient is at the pharmacy, call can be transferred to refill team.   1. Which medications need to be refilled? (please list name of each medication and dose if known) potassium chloride SA (KLOR-CON M20) 20 MEQ tablet Take 40 meq (2 tablets) in the AM and 20 mEq (1 tablet) in the PM by mouth. And torsemide (DEMADEX) 20 MG tablet Take 2 tablets (40 mg total) by mouth 2 (two) times daily.    2. Which pharmacy/location (including street and city if local pharmacy) is medication to be sent to? [place], [place]  3. Do they need a 30 day or 90 day supply? 39 DAY   Rx was sent to [facility] [facility] when it should of went to [facility]. Please send new rx to [facility].

## 2018-09-15 NOTE — Telephone Encounter (Signed)
Pt is a 85 yr old male who saw PA on 09/11/2018, wt at that visit was 127.Kg. SCr on that same date was 0.96, will refill Eliquis 5mg  BID.

## 2018-09-15 NOTE — Telephone Encounter (Signed)
Pt's medications were resent to pt's pharmacy as requested. Confirmation received.

## 2018-09-16 ENCOUNTER — Ambulatory Visit (INDEPENDENT_AMBULATORY_CARE_PROVIDER_SITE_OTHER): Payer: Medicare Other

## 2018-09-16 DIAGNOSIS — I442 Atrioventricular block, complete: Secondary | ICD-10-CM | POA: Diagnosis not present

## 2018-09-18 ENCOUNTER — Encounter: Payer: Self-pay | Admitting: Cardiology

## 2018-09-18 NOTE — Progress Notes (Signed)
Remote pacemaker transmission.   

## 2018-09-22 ENCOUNTER — Other Ambulatory Visit: Payer: Medicare Other

## 2018-09-22 DIAGNOSIS — I25119 Atherosclerotic heart disease of native coronary artery with unspecified angina pectoris: Secondary | ICD-10-CM

## 2018-09-22 DIAGNOSIS — I10 Essential (primary) hypertension: Secondary | ICD-10-CM | POA: Diagnosis not present

## 2018-09-22 DIAGNOSIS — I5043 Acute on chronic combined systolic (congestive) and diastolic (congestive) heart failure: Secondary | ICD-10-CM

## 2018-09-22 DIAGNOSIS — I5030 Unspecified diastolic (congestive) heart failure: Secondary | ICD-10-CM

## 2018-09-22 LAB — CUP PACEART REMOTE DEVICE CHECK
Battery Remaining Longevity: 107 mo
Battery Voltage: 2.99 V
Brady Statistic AP VP Percent: 76 %
Brady Statistic AP VS Percent: 1 %
Brady Statistic AS VP Percent: 23 %
Brady Statistic AS VS Percent: 1 %
Brady Statistic RA Percent Paced: 44 %
Brady Statistic RV Percent Paced: 99 %
Date Time Interrogation Session: 20200116033259
Implantable Lead Implant Date: 20170202
Implantable Lead Implant Date: 20170202
Implantable Lead Location: 753859
Implantable Lead Location: 753860
Implantable Pulse Generator Implant Date: 20170202
Lead Channel Impedance Value: 400 Ohm
Lead Channel Impedance Value: 440 Ohm
Lead Channel Pacing Threshold Amplitude: 0.875 V
Lead Channel Pacing Threshold Amplitude: 1.25 V
Lead Channel Pacing Threshold Pulse Width: 0.4 ms
Lead Channel Pacing Threshold Pulse Width: 0.4 ms
Lead Channel Sensing Intrinsic Amplitude: 12 mV
Lead Channel Sensing Intrinsic Amplitude: 2.6 mV
Lead Channel Setting Pacing Amplitude: 1.125
Lead Channel Setting Pacing Pulse Width: 0.4 ms
Lead Channel Setting Sensing Sensitivity: 2 mV
MDC IDC MSMT BATTERY REMAINING PERCENTAGE: 95.5 %
MDC IDC SET LEADCHNL RA PACING AMPLITUDE: 2.5 V
Pulse Gen Model: 2240
Pulse Gen Serial Number: 7864186

## 2018-09-23 ENCOUNTER — Telehealth: Payer: Self-pay

## 2018-09-23 DIAGNOSIS — I5030 Unspecified diastolic (congestive) heart failure: Secondary | ICD-10-CM

## 2018-09-23 DIAGNOSIS — I10 Essential (primary) hypertension: Secondary | ICD-10-CM

## 2018-09-23 LAB — BASIC METABOLIC PANEL
BUN/Creatinine Ratio: 24 (ref 10–24)
BUN: 31 mg/dL — ABNORMAL HIGH (ref 8–27)
CO2: 25 mmol/L (ref 20–29)
Calcium: 9 mg/dL (ref 8.6–10.2)
Chloride: 95 mmol/L — ABNORMAL LOW (ref 96–106)
Creatinine, Ser: 1.3 mg/dL — ABNORMAL HIGH (ref 0.76–1.27)
GFR calc Af Amer: 57 mL/min/{1.73_m2} — ABNORMAL LOW (ref >59)
GFR calc non Af Amer: 49 mL/min/{1.73_m2} — ABNORMAL LOW (ref >59)
GLUCOSE: 112 mg/dL — AB (ref 65–99)
Potassium: 4.7 mmol/L (ref 3.5–5.2)
Sodium: 138 mmol/L (ref 134–144)

## 2018-09-23 MED ORDER — POTASSIUM CHLORIDE CRYS ER 20 MEQ PO TBCR: 20.0000 meq | EXTENDED_RELEASE_TABLET | Freq: Two times a day (BID) | ORAL | 3 refills | Status: DC

## 2018-09-23 MED ORDER — TORSEMIDE 20 MG PO TABS: 20.0000 mg | ORAL_TABLET | Freq: Two times a day (BID) | ORAL | 3 refills | Status: DC

## 2018-09-23 NOTE — Telephone Encounter (Signed)
Reviewed lab results with pt's daughter [name] who verbalized understanding. Per scott weaver, PA-C to have pt decrease torsemide back to 20 mg BID and K+ to 20 mEq BID with a repeat BMET in 1 week ( lab appt 1/30). Orders have been placed in Epic.

## 2018-09-23 NOTE — Telephone Encounter (Signed)
-----   Message from Liliane Shi, PA-C sent at 09/23/2018  7:51 AM EST ----- Creatinine increased. K+ normal. Recommendations:  - Decrease Torsemide back to 20 mg Twice daily  - Decrease K+ back to 20 mEq Twice daily   - BMET 1 week Richardson Dopp, PA-C    09/23/2018 7:48 AM

## 2018-10-01 ENCOUNTER — Other Ambulatory Visit: Payer: Medicare Other | Admitting: *Deleted

## 2018-10-01 DIAGNOSIS — I10 Essential (primary) hypertension: Secondary | ICD-10-CM

## 2018-10-01 DIAGNOSIS — I5030 Unspecified diastolic (congestive) heart failure: Secondary | ICD-10-CM | POA: Diagnosis not present

## 2018-10-02 LAB — BASIC METABOLIC PANEL
BUN / CREAT RATIO: 17 (ref 10–24)
BUN: 22 mg/dL (ref 8–27)
CO2: 23 mmol/L (ref 20–29)
Calcium: 9 mg/dL (ref 8.6–10.2)
Chloride: 99 mmol/L (ref 96–106)
Creatinine, Ser: 1.31 mg/dL — ABNORMAL HIGH (ref 0.76–1.27)
GFR calc Af Amer: 56 mL/min/{1.73_m2} — ABNORMAL LOW (ref >59)
GFR calc non Af Amer: 49 mL/min/{1.73_m2} — ABNORMAL LOW (ref >59)
Glucose: 110 mg/dL — ABNORMAL HIGH (ref 65–99)
Potassium: 4.3 mmol/L (ref 3.5–5.2)
Sodium: 139 mmol/L (ref 134–144)

## 2018-10-05 ENCOUNTER — Telehealth: Payer: Self-pay | Admitting: Internal Medicine

## 2018-10-05 NOTE — Telephone Encounter (Signed)
Patient's daughter (DPR) called back. Patient complaining of SOB. Patient's daughter stated he is not to a point he needs to go to the ED. Patient's daughter is wanting to know if portable oxygen would help. Informed patient's daughter that if patient has SOB due to fluid build up that oxygen might help, but would not solve the problem. Informed her that patient would need to be tested on his O2 level to see if he needs oxygen, but if it's due to his CHF that his medications might need to be adjusted.Patient's daughter stated that patient eats out all the time and is not a healthy eater. Informed patient's daughter that a low salt and heart healthy diet would also help, and that is something the patient can do in the mean time. Patient has appointment tomorrow with Ermalinda Barrios PA. Will forward to Ermalinda Barrios and  her nurse, so they is aware.

## 2018-10-05 NOTE — Telephone Encounter (Signed)
Left message for patient's daughter to call back. 

## 2018-10-05 NOTE — Telephone Encounter (Signed)
New message      Pt granddaughter karen wants to know if portable oxygen is needed and if it would help. I was able to make appt to tomorrow with PA.

## 2018-10-06 ENCOUNTER — Inpatient Hospital Stay (HOSPITAL_COMMUNITY)
Admission: AD | Admit: 2018-10-06 | Discharge: 2018-10-09 | DRG: 292 | Disposition: A | Discharge status: Home / Self Care | Payer: Medicare Other | Source: Ambulatory Visit | Attending: Interventional Cardiology | Admitting: Interventional Cardiology

## 2018-10-06 ENCOUNTER — Other Ambulatory Visit: Payer: Self-pay | Admitting: Physician Assistant

## 2018-10-06 ENCOUNTER — Other Ambulatory Visit: Payer: Self-pay

## 2018-10-06 ENCOUNTER — Ambulatory Visit (INDEPENDENT_AMBULATORY_CARE_PROVIDER_SITE_OTHER): Payer: Medicare Other | Admitting: Physician Assistant

## 2018-10-06 ENCOUNTER — Encounter: Payer: Self-pay | Admitting: Physician Assistant

## 2018-10-06 ENCOUNTER — Encounter (HOSPITAL_COMMUNITY): Payer: Self-pay | Admitting: General Practice

## 2018-10-06 VITALS — BP 128/76 | HR 71 | Ht 69.0 in | Wt 294.8 lb

## 2018-10-06 DIAGNOSIS — I110 Hypertensive heart disease with heart failure: Principal | ICD-10-CM | POA: Diagnosis present

## 2018-10-06 DIAGNOSIS — R0602 Shortness of breath: Secondary | ICD-10-CM | POA: Diagnosis not present

## 2018-10-06 DIAGNOSIS — Z951 Presence of aortocoronary bypass graft: Secondary | ICD-10-CM | POA: Diagnosis not present

## 2018-10-06 DIAGNOSIS — I2510 Atherosclerotic heart disease of native coronary artery without angina pectoris: Secondary | ICD-10-CM | POA: Diagnosis present

## 2018-10-06 DIAGNOSIS — E785 Hyperlipidemia, unspecified: Secondary | ICD-10-CM | POA: Diagnosis present

## 2018-10-06 DIAGNOSIS — Z8249 Family history of ischemic heart disease and other diseases of the circulatory system: Secondary | ICD-10-CM

## 2018-10-06 DIAGNOSIS — I25118 Atherosclerotic heart disease of native coronary artery with other forms of angina pectoris: Secondary | ICD-10-CM

## 2018-10-06 DIAGNOSIS — Z9079 Acquired absence of other genital organ(s): Secondary | ICD-10-CM

## 2018-10-06 DIAGNOSIS — Z808 Family history of malignant neoplasm of other organs or systems: Secondary | ICD-10-CM

## 2018-10-06 DIAGNOSIS — I255 Ischemic cardiomyopathy: Secondary | ICD-10-CM | POA: Diagnosis present

## 2018-10-06 DIAGNOSIS — E669 Obesity, unspecified: Secondary | ICD-10-CM | POA: Diagnosis present

## 2018-10-06 DIAGNOSIS — Z87891 Personal history of nicotine dependence: Secondary | ICD-10-CM

## 2018-10-06 DIAGNOSIS — I5021 Acute systolic (congestive) heart failure: Secondary | ICD-10-CM | POA: Diagnosis not present

## 2018-10-06 DIAGNOSIS — N289 Disorder of kidney and ureter, unspecified: Secondary | ICD-10-CM | POA: Diagnosis present

## 2018-10-06 DIAGNOSIS — Z7901 Long term (current) use of anticoagulants: Secondary | ICD-10-CM

## 2018-10-06 DIAGNOSIS — J439 Emphysema, unspecified: Secondary | ICD-10-CM | POA: Diagnosis present

## 2018-10-06 DIAGNOSIS — I480 Paroxysmal atrial fibrillation: Secondary | ICD-10-CM | POA: Diagnosis not present

## 2018-10-06 DIAGNOSIS — I5043 Acute on chronic combined systolic (congestive) and diastolic (congestive) heart failure: Secondary | ICD-10-CM | POA: Diagnosis present

## 2018-10-06 DIAGNOSIS — N1832 Chronic kidney disease, stage 3b: Secondary | ICD-10-CM

## 2018-10-06 DIAGNOSIS — Z8546 Personal history of malignant neoplasm of prostate: Secondary | ICD-10-CM

## 2018-10-06 DIAGNOSIS — I509 Heart failure, unspecified: Secondary | ICD-10-CM

## 2018-10-06 DIAGNOSIS — Z823 Family history of stroke: Secondary | ICD-10-CM

## 2018-10-06 DIAGNOSIS — M8580 Other specified disorders of bone density and structure, unspecified site: Secondary | ICD-10-CM | POA: Diagnosis present

## 2018-10-06 DIAGNOSIS — Z85828 Personal history of other malignant neoplasm of skin: Secondary | ICD-10-CM | POA: Diagnosis not present

## 2018-10-06 DIAGNOSIS — Z86010 Personal history of colonic polyps: Secondary | ICD-10-CM

## 2018-10-06 DIAGNOSIS — N179 Acute kidney failure, unspecified: Secondary | ICD-10-CM

## 2018-10-06 DIAGNOSIS — Z9841 Cataract extraction status, right eye: Secondary | ICD-10-CM | POA: Diagnosis not present

## 2018-10-06 DIAGNOSIS — Z8042 Family history of malignant neoplasm of prostate: Secondary | ICD-10-CM

## 2018-10-06 DIAGNOSIS — R918 Other nonspecific abnormal finding of lung field: Secondary | ICD-10-CM | POA: Diagnosis not present

## 2018-10-06 DIAGNOSIS — Z950 Presence of cardiac pacemaker: Secondary | ICD-10-CM

## 2018-10-06 DIAGNOSIS — I493 Ventricular premature depolarization: Secondary | ICD-10-CM | POA: Diagnosis not present

## 2018-10-06 DIAGNOSIS — Z981 Arthrodesis status: Secondary | ICD-10-CM

## 2018-10-06 DIAGNOSIS — I10 Essential (primary) hypertension: Secondary | ICD-10-CM | POA: Diagnosis present

## 2018-10-06 DIAGNOSIS — Z6841 Body Mass Index (BMI) 40.0 and over, adult: Secondary | ICD-10-CM | POA: Diagnosis not present

## 2018-10-06 DIAGNOSIS — N182 Chronic kidney disease, stage 2 (mild): Secondary | ICD-10-CM

## 2018-10-06 DIAGNOSIS — Z79899 Other long term (current) drug therapy: Secondary | ICD-10-CM

## 2018-10-06 DIAGNOSIS — Z955 Presence of coronary angioplasty implant and graft: Secondary | ICD-10-CM | POA: Diagnosis not present

## 2018-10-06 DIAGNOSIS — Z8679 Personal history of other diseases of the circulatory system: Secondary | ICD-10-CM | POA: Diagnosis not present

## 2018-10-06 DIAGNOSIS — Z8051 Family history of malignant neoplasm of kidney: Secondary | ICD-10-CM

## 2018-10-06 HISTORY — DX: Dyspnea, unspecified: R06.00

## 2018-10-06 HISTORY — DX: Heart failure, unspecified: I50.9

## 2018-10-06 LAB — CBC WITH DIFFERENTIAL/PLATELET
ABS IMMATURE GRANULOCYTES: 0.03 10*3/uL (ref 0.00–0.07)
BASOS PCT: 0 %
Basophils Absolute: 0 10*3/uL (ref 0.0–0.1)
Eosinophils Absolute: 0.3 10*3/uL (ref 0.0–0.5)
Eosinophils Relative: 4 %
HCT: 39.8 % (ref 39.0–52.0)
Hemoglobin: 12.5 g/dL — ABNORMAL LOW (ref 13.0–17.0)
Immature Granulocytes: 0 %
Lymphocytes Relative: 16 %
Lymphs Abs: 1.2 10*3/uL (ref 0.7–4.0)
MCH: 30.1 pg (ref 26.0–34.0)
MCHC: 31.4 g/dL (ref 30.0–36.0)
MCV: 95.9 fL (ref 80.0–100.0)
Monocytes Absolute: 0.6 10*3/uL (ref 0.1–1.0)
Monocytes Relative: 8 %
Neutro Abs: 5.2 10*3/uL (ref 1.7–7.7)
Neutrophils Relative %: 72 %
PLATELETS: 118 10*3/uL — AB (ref 150–400)
RBC: 4.15 MIL/uL — ABNORMAL LOW (ref 4.22–5.81)
RDW: 14.8 % (ref 11.5–15.5)
WBC: 7.3 10*3/uL (ref 4.0–10.5)
nRBC: 0 % (ref 0.0–0.2)

## 2018-10-06 LAB — COMPREHENSIVE METABOLIC PANEL
ALT: 14 U/L (ref 0–44)
AST: 19 U/L (ref 15–41)
Albumin: 3.4 g/dL — ABNORMAL LOW (ref 3.5–5.0)
Alkaline Phosphatase: 88 U/L (ref 38–126)
Anion gap: 7 (ref 5–15)
BUN: 26 mg/dL — ABNORMAL HIGH (ref 8–23)
CO2: 29 mmol/L (ref 22–32)
Calcium: 8.8 mg/dL — ABNORMAL LOW (ref 8.9–10.3)
Chloride: 102 mmol/L (ref 98–111)
Creatinine, Ser: 1.24 mg/dL (ref 0.61–1.24)
GFR calc Af Amer: >60 mL/min (ref >60)
GFR calc non Af Amer: 52 mL/min — ABNORMAL LOW (ref >60)
Glucose, Bld: 94 mg/dL (ref 70–99)
Potassium: 4.1 mmol/L (ref 3.5–5.1)
Sodium: 138 mmol/L (ref 135–145)
Total Bilirubin: 0.6 mg/dL (ref 0.3–1.2)
Total Protein: 6.8 g/dL (ref 6.5–8.1)

## 2018-10-06 LAB — TSH: TSH: 7.828 u[IU]/mL — ABNORMAL HIGH (ref 0.350–4.500)

## 2018-10-06 LAB — BRAIN NATRIURETIC PEPTIDE: B Natriuretic Peptide: 203 pg/mL — ABNORMAL HIGH (ref 0.0–100.0)

## 2018-10-06 LAB — APTT: aPTT: 47 seconds — ABNORMAL HIGH (ref 24–36)

## 2018-10-06 LAB — MAGNESIUM: MAGNESIUM: 2.4 mg/dL (ref 1.7–2.4)

## 2018-10-06 MED ORDER — NITROGLYCERIN 0.4 MG SL SUBL: 0.4000 mg | SUBLINGUAL_TABLET | SUBLINGUAL | Status: DC | PRN

## 2018-10-06 MED ORDER — PRAVASTATIN SODIUM 10 MG PO TABS
20.0000 mg | ORAL_TABLET | Freq: Every day | ORAL | Status: DC
  Administered 2018-10-06 – 2018-10-08 (×3): 20 mg via ORAL
  Filled 2018-10-06 (×3): qty 2

## 2018-10-06 MED ORDER — FUROSEMIDE 10 MG/ML IJ SOLN
80.0000 mg | Freq: Once | INTRAMUSCULAR | Status: AC
  Administered 2018-10-06: 80 mg via INTRAVENOUS
  Filled 2018-10-06: qty 8

## 2018-10-06 MED ORDER — ONDANSETRON HCL 4 MG/2ML IJ SOLN: 4.0000 mg | Freq: Four times a day (QID) | INTRAMUSCULAR | Status: DC | PRN

## 2018-10-06 MED ORDER — CARVEDILOL 6.25 MG PO TABS
6.2500 mg | ORAL_TABLET | Freq: Two times a day (BID) | ORAL | Status: DC
  Administered 2018-10-06 – 2018-10-09 (×6): 6.25 mg via ORAL
  Filled 2018-10-06 (×6): qty 1

## 2018-10-06 MED ORDER — FUROSEMIDE 10 MG/ML IJ SOLN
40.0000 mg | Freq: Two times a day (BID) | INTRAMUSCULAR | Status: DC
  Administered 2018-10-07 – 2018-10-08 (×4): 40 mg via INTRAVENOUS
  Filled 2018-10-06 (×4): qty 4

## 2018-10-06 MED ORDER — APIXABAN 5 MG PO TABS
5.0000 mg | ORAL_TABLET | Freq: Two times a day (BID) | ORAL | Status: DC
  Administered 2018-10-06 – 2018-10-09 (×6): 5 mg via ORAL
  Filled 2018-10-06 (×7): qty 1

## 2018-10-06 MED ORDER — ASPIRIN EC 81 MG PO TBEC
81.0000 mg | DELAYED_RELEASE_TABLET | Freq: Every day | ORAL | Status: DC
  Administered 2018-10-06 – 2018-10-09 (×4): 81 mg via ORAL
  Filled 2018-10-06 (×4): qty 1

## 2018-10-06 MED ORDER — POTASSIUM CHLORIDE CRYS ER 20 MEQ PO TBCR
20.0000 meq | EXTENDED_RELEASE_TABLET | Freq: Two times a day (BID) | ORAL | Status: DC
  Administered 2018-10-06 – 2018-10-09 (×6): 20 meq via ORAL
  Filled 2018-10-06 (×7): qty 1

## 2018-10-06 MED ORDER — POLYETHYLENE GLYCOL 3350 17 G PO PACK
1.0000 | PACK | Freq: Every day | ORAL | Status: DC
  Administered 2018-10-08 – 2018-10-09 (×2): 17 g via ORAL
  Filled 2018-10-06 (×3): qty 1

## 2018-10-06 MED ORDER — TAMSULOSIN HCL 0.4 MG PO CAPS
0.4000 mg | ORAL_CAPSULE | Freq: Every day | ORAL | Status: DC
  Administered 2018-10-06 – 2018-10-08 (×3): 0.4 mg via ORAL
  Filled 2018-10-06 (×3): qty 1

## 2018-10-06 MED ORDER — ACETAMINOPHEN 325 MG PO TABS: 650.0000 mg | ORAL_TABLET | ORAL | Status: DC | PRN

## 2018-10-06 NOTE — Patient Instructions (Addendum)
Medication Instructions:  Your physician recommends that you continue on your current medications as directed. Please refer to the Current Medication list given to you today.  If you need a refill on your cardiac medications before your next appointment, please call your pharmacy.   Lab work: None  If you have labs (blood work) drawn today and your tests are completely normal, you will receive your results only by: Marland Kitchen MyChart Message (if you have MyChart) OR . A paper copy in the mail If you have any lab test that is abnormal or we need to change your treatment, we will call you to review the results.  Testing/Procedures: None  Follow-Up: Please keep your appointment with Richardson Dopp PA-C on 10/27/2018 @ 11:15 PM  Any Other Special Instructions Will Be Listed Below (If Applicable).  PLEASE PROCEED TO [facility]

## 2018-10-06 NOTE — Plan of Care (Signed)
  Problem: Activity: Goal: Risk for activity intolerance will decrease Outcome: Progressing   Problem: Education: Goal: Ability to demonstrate management of disease process will improve Outcome: Progressing   Problem: Cardiac: Goal: Ability to achieve and maintain adequate cardiopulmonary perfusion will improve Outcome: Progressing

## 2018-10-06 NOTE — H&P (Addendum)
Cardiology Office Note    Date:  10/06/2018   ID:  KEIMON BASALDUA, DOB 1928/11/19, MRN [id number]  PCP:  Bernerd Limbo, MD  Cardiologist: [name], MD EPS: Cristopher Peru, MD  Chief complaint shortness of breath swelling   History of Present Illness:  Levi [name] is a 85 y.o. male with history of CAD status post CABG 2011 and DES to the LAD times 10/2011, PAF CHA2DS2-VASc equals 5 on apixaban, status post pacemaker 09/2015 for symptomatic bradycardia Mobitz 2, diastolic CHF, hypertension and HLD.  He went to [facility] for a second opinion 2018 and had a cardiac catheterization demonstrating patent LIMA to the LAD, 80% mid in-stent restenosis of the LAD, 50 to 60% mid circumflex, 70% ostial OM 2 stenosis, 60% proximal RCA and 60% distal RCA.  All vein grafts were previously known to be occluded.  No targets for PCI medical therapy recommended.  Most recent echo 09/11/2018 LVEF now 40 to 45% with inferior wall motion abnormality and moderate diastolic dysfunction.  She comes in accompanied by his son.  Unfortunately he is gained another 13 pounds and is having a lot of trouble with shortness of breath.  He could hardly walk in here without stopping several times.  BNP was 2599 January 10 and he weighed 281 at that time now is up to 294.  He does eat out a lot, mostly home cooking almost daily.  Creatinine jumped up to 1.3 and Demadex was decreased.    Past Medical History:  Diagnosis Date  . Asthma   . Benign neoplasm of colon   . CAD (coronary artery disease) Dec 2011   s/p CABG per Dr. Roxy Manns; had normal EF; All SVGs occluded per follow up cath with only LIMA to LAD patent; s/p PCI of the proximal and mid LAD February 2014 per Dr. Burt Knack  . Dyspnea on exertion   . Edema   . Emphysema    "never treated for it" (10/05/2015)  . History of hiatal hernia   . HLD (hyperlipidemia)   . HTN (hypertension)   . Malaise and fatigue   . Obesity   . Osteopenia   . Pneumonia 1960s X 1; 1990s X  1  . Presence of permanent cardiac pacemaker   . Prostate cancer ([place])   . Skin cancer    "face, nose, top of ears, scalp"  . Symptomatic bradycardia    STJ PPM, 10/05/15, Dr. Lovena Le    Past Surgical History:  Procedure Laterality Date  . ANTERIOR CERVICAL DECOMP/DISCECTOMY FUSION    . BACK SURGERY    . C-EYE SURGERY PROCEDURE    . CATARACT EXTRACTION Right   . CORONARY ANGIOPLASTY WITH STENT PLACEMENT  10/14/12   with stent to proximal and mid LAD  . CORONARY ARTERY BYPASS GRAFT  08/23/10   "CABG X4"  . EP IMPLANTABLE DEVICE N/A 10/05/2015   Procedure: Pacemaker Implant;  Surgeon: [name], MD;  Location: [facility];  Service: Cardiovascular;  Laterality: N/A;  . EP IMPLANTABLE DEVICE N/A 10/06/2015   Procedure: Lead Revision/Repair;  Surgeon: [name], MD;  Location: [place];  Service: Cardiovascular;  Laterality: N/A;  . EYE SURGERY    . INSERT / REPLACE / REMOVE PACEMAKER  10/05/2015  . LEFT HEART CATHETERIZATION WITH CORONARY/GRAFT ANGIOGRAM N/A 09/01/2012   Procedure: LEFT HEART CATHETERIZATION WITH [name];  Surgeon: [name], MD;  Location: [facility] CATH LAB;  Service: Cardiovascular;  Laterality: N/A;  .  MEDIAN STERNOTOMY  08/23/10  . MOHS SURGERY  "@ least twice"  . PACEMAKER INSERTION     STJ 10/05/15  . PERCUTANEOUS CORONARY STENT INTERVENTION (PCI-S) N/A 10/14/2012   Procedure: PERCUTANEOUS CORONARY STENT INTERVENTION (PCI-S);  Surgeon: [name], MD;  Location: [facility] CATH LAB;  Service: Cardiovascular;  Laterality: N/A;  . PROSTATE BIOPSY    . TRANSURETHRAL RESECTION OF PROSTATE      Current Medications: No outpatient medications have been marked as taking for the 10/06/18 encounter (Appointment) with Imogene Burn, PA-C.     Allergies:   Patient has no known allergies.   Social History   Socioeconomic History  . Marital status: Widowed    Spouse name: Not on file  . Number of children: Not on file  . Years of  education: Not on file  . Highest education level: Not on file  Occupational History  . Occupation: retired Information systems manager: RETIRED  Social Needs  . Financial resource strain: Not on file  . Food insecurity:    Worry: Not on file    Inability: Not on file  . Transportation needs:    Medical: Not on file    Non-medical: Not on file  Tobacco Use  . Smoking status: Former Smoker    Packs/day: 1.00    Years: 30.00    Pack years: 30.00    Types: Cigarettes    Last attempt to quit: 09/07/1975    Years since quitting: 43.1  . Smokeless tobacco: Former Systems developer    Types: [place] date: 07/27/2015  Substance and Sexual Activity  . Alcohol use: No    Alcohol/week: 0.0 standard drinks  . Drug use: No  . Sexual activity: Not Currently  Lifestyle  . Physical activity:    Days per week: Not on file    Minutes per session: Not on file  . Stress: Not on file  Relationships  . Social connections:    Talks on phone: Not on file    Gets together: Not on file    Attends religious service: Not on file    Active member of club or organization: Not on file    Attends meetings of clubs or organizations: Not on file    Relationship status: Not on file  Other Topics Concern  . Not on file  Social History Narrative  . Not on file     Family History:  The patient's family history includes Heart attack in his father; Heart disease in his brother; Hypertension in his mother; Kidney cancer in his father; Prostate cancer in his brother; Skin cancer in his brother; Stroke in his mother.   ROS:   Please see the history of present illness.    Review of Systems  Constitution: Positive for malaise/fatigue.  HENT: Negative.   Cardiovascular: Positive for dyspnea on exertion and leg swelling.  Respiratory: Positive for shortness of breath and sleep disturbances due to breathing.   Endocrine: Negative.   Hematologic/Lymphatic: Negative.   Musculoskeletal: Negative.   Gastrointestinal:  Negative.   Genitourinary: Negative.   Neurological: Negative.    All other systems reviewed and are negative.   PHYSICAL EXAM:   VS: Blood pressure 128/76, pulse 71, O2 sat 94%, weight 294.8 pounds Physical Exam  GEN: Obese, in no acute distress  HEENT: normal  Neck: Increased JVD, carotid bruits, or masses Cardiac irregular irregular with 1/6 systolic murmur at the left sternal border Respiratory: Bibasilar Rales GI: soft, nontender, nondistended, +  BS Ext: 2-3+ edema bilaterally without cyanosis, clubbing, decreased distal pulses bilaterally MS: no deformity or atrophy  Skin: warm and dry, no rash Neuro:  Alert and Oriented x 3, Strength and sensation are intact Psych: euthymic mood, full affect  Wt Readings from Last 3 Encounters:  09/11/18 281 lb 6.4 oz (127.6 kg)  08/19/18 281 lb 14.1 oz (127.9 kg)  06/08/18 284 lb (128.8 kg)      Studies/Labs Reviewed:   EKG:  EKG is not ordered today.   Recent Labs: 08/19/2018: Hemoglobin 13.8; Platelets 148 09/11/2018: NT-Pro BNP 2,599 10/01/2018: BUN 22; Creatinine, Ser 1.31; Potassium 4.3; Sodium 139   Lipid Panel    Component Value Date/Time   CHOL 113 06/01/2014 0930   TRIG 115.0 06/01/2014 0930   HDL 32.40 (L) 06/01/2014 0930   CHOLHDL 3 06/01/2014 0930   VLDL 23.0 06/01/2014 0930   LDLCALC 58 06/01/2014 0930   LDLDIRECT 70.7 06/10/2013 1344    Additional studies/ records that were reviewed today include:   2D echo 08/28/2018 Study Conclusions   - Left ventricle: The cavity size was normal. There was mild   concentric hypertrophy. Systolic function was mildly to   moderately reduced. The estimated ejection fraction was in the   range of 40% to 45%. Hypokinesis of the mid-apicalinferior,   inferoseptal, and apical myocardium. Features are consistent with   a pseudonormal left ventricular filling pattern, with concomitant   abnormal relaxation and increased filling pressure (grade 2   diastolic dysfunction).  Doppler parameters are consistent with   indeterminate ventricular filling pressure. - Aortic valve: Transvalvular velocity was within the normal range.   There was no stenosis. There was no regurgitation. - Mitral valve: Calcified annulus. Transvalvular velocity was   within the normal range. There was no evidence for stenosis.   There was no regurgitation. - Right ventricle: The cavity size was normal. Wall thickness was   normal. Systolic function was normal. - Right atrium: The atrium was moderately dilated. - Atrial septum: The septum bowed from right to left, consistent   with increased right atrial pressure. - Tricuspid valve: There was mild regurgitation. - Pulmonary arteries: Systolic pressure could not be accurately   estimated.   ------------------------------------------------------------------- Labs, prior tests, procedures, and surgery: Permanent pacemaker system implantation.   Coronary artery bypass grafting.    Cardiac catheterization 02/10/2017 ([facility]) Coronary angiography reveals 3-vessel CAD.  LM is diffusely diseased with 50% distal stenosis.   LAD has a 80% mid instent restenosis,  LCX has serial 50-60% stenoses in the mid segment, OM2 is a small vessel and has a 70% ostial stenosis.  RCA has long diffuse 50-60% proximal and 60% distal stenosis.  LIMA to LAD is patent with a kink in the mid segment. Flow is normal.  LV-Gram reveals EF 50%. LVEDP is 8 mmHg.  SVG grafts are known to be chronically occluded, so not injected    Nuclear stress test 01/21/2017 ([facility]) 1.  Moderate to severe inferoapical inducible ischemia with regadenoson 2.  Mild to moderate mid inferior and posterior basal defect. Cannot rule out prior infarct. 3.  Mild to moderate hypokinesis of the inferior wall. Dyskinesia/akinesia of the apex. 4.  Moderately decreased left ventricular ejection fraction at 43%. 5.  Please correlate with  a cardiac ultrasound   Echo 06/20/2016 EF 41-74, grade 1 diastolic dysfunction, MAC, moderate LAE       ASSESSMENT:    1. Coronary artery disease involving  native coronary artery of native heart without angina pectoris   2. Ischemic cardiomyopathy   3.   Acute on chronic combined systolic and diastolic CHF (congestive heart failure) ([place])   4. PAF (paroxysmal atrial fibrillation) ([facility])   5. Cardiac pacemaker      PLAN:  In order of problems listed above:  CAD status post previous CABG last cath at [facility] 01/2017 all vein grafts occluded as previously noted LIMA to the LAD patent with a kink in the mid segment but flow was normal LVEF 50%, medical therapy recommended  Ischemic cardiomyopathy ejection fraction 40 to 45% on most recent echo 01/26/2018  Acute on Chronic combined systolic and diastolic CHF patient has gained 13 pounds since seen by Richardson Dopp mid-January and Demadex was increased.  Creatinine went up to 1.3 with increase in torsemide.  BNP at that time was 2600.  Discussed with Dr. Irish Lack who also examined patient and concurs patient needs admitted for IV diuresis.  PAF on Eliquis  Status post cardiac pacemaker    Medication Adjustments/Labs and Tests Ordered: Current medicines are reviewed at length with the patient today.  Concerns regarding medicines are outlined above.  Medication changes, Labs and Tests ordered today are listed in the Patient Instructions below. There are no Patient Instructions on file for this visit.   Sumner Boast, PA-C  10/06/2018 12:23 PM    [facility] [address], [place], [place]  [address] Phone: [phone]; Fax: [phone]   I have examined the patient and reviewed assessment and plan and discussed with patient.  Agree with above as stated.  Patient with significant weight gain and volume overload.  He is not responding to oral diuretics.  He also has some renal insufficiency.   We will plan to admit the patient to the hospital.  He will need IV diuretics.  We will first give a dose of Lasix IV 80 mg x 1.  We will have to follow renal function.  Likely he will need, 40 mg IV twice daily going forward and this can be titrated based on his renal function.  Ejection fraction is dropped.  Review of the prior record shows that he had bypass surgery but all of his vein grafts were occluded.  Likely ischemic cardiomyopathy, acute systolic heart failure as the cause of his volume overload.  He will also need to decrease salt intake.  Anticipate he will be in the hospital for at least several days.  Larae Grooms

## 2018-10-06 NOTE — Discharge Instructions (Addendum)

## 2018-10-06 NOTE — Telephone Encounter (Signed)
New Message   Patient's daughter wants to make sure Estella Husk see's her my chart message before she see's her father today.

## 2018-10-06 NOTE — Progress Notes (Signed)
Cardiology Office Note    Date:  10/06/2018   ID:  [name], DOB 1929/08/18, MRN [id number]  PCP:  Bernerd Limbo, MD  Cardiologist: [name], MD EPS: Cristopher Peru, MD  Chief Complaint  Patient presents with  . Shortness of Breath    History of Present Illness:  Levi [name] is a 85 y.o. male with history of CAD status post CABG 2011 and DES to the LAD times 10/2011, PAF CHA2DS2-VASc equals 5 on apixaban, status post pacemaker 09/2015 for symptomatic bradycardia Mobitz 2, diastolic CHF, hypertension and HLD.  Levi [name] went to [facility] for a second opinion 2018 and had a cardiac catheterization demonstrating patent LIMA to the LAD, 80% mid in-stent restenosis of the LAD, 50 to 60% mid circumflex, 70% ostial OM 2 stenosis, 60% proximal RCA and 60% distal RCA.  All vein grafts were previously known to be occluded.  No targets for PCI medical therapy recommended.  Most recent echo 09/11/2018 LVEF now 40 to 45% with inferior wall motion abnormality and moderate diastolic dysfunction.  She comes in accompanied by his son.  Unfortunately Levi [name] is gained another 13 pounds and is having a lot of trouble with shortness of breath.  Levi [name] could hardly walk in here without stopping several times.  BNP was 2599 January 10 and Levi [name] weighed 281 at that time now is up to 294.  Levi [name] does eat out a lot, mostly home cooking almost daily.  Creatinine jumped up to 1.3 and Demadex was decreased.    Past Medical History:  Diagnosis Date  . Asthma   . Benign neoplasm of colon   . CAD (coronary artery disease) Dec 2011   s/p CABG per Dr. Roxy Manns; had normal EF; All SVGs occluded per follow up cath with only LIMA to LAD patent; s/p PCI of the proximal and mid LAD February 2014 per Dr. Burt Knack  . Dyspnea on exertion   . Edema   . Emphysema    "never treated for it" (10/05/2015)  . History of hiatal hernia   . HLD (hyperlipidemia)   . HTN (hypertension)   . Malaise and fatigue   . Obesity   . Osteopenia   . Pneumonia  1960s X 1; 1990s X 1  . Presence of permanent cardiac pacemaker   . Prostate cancer ([place])   . Skin cancer    "face, nose, top of ears, scalp"  . Symptomatic bradycardia    STJ PPM, 10/05/15, Dr. Lovena Le    Past Surgical History:  Procedure Laterality Date  . ANTERIOR CERVICAL DECOMP/DISCECTOMY FUSION    . BACK SURGERY    . C-EYE SURGERY PROCEDURE    . CATARACT EXTRACTION Right   . CORONARY ANGIOPLASTY WITH STENT PLACEMENT  10/14/12   with stent to proximal and mid LAD  . CORONARY ARTERY BYPASS GRAFT  08/23/10   "CABG X4"  . EP IMPLANTABLE DEVICE N/A 10/05/2015   Procedure: Pacemaker Implant;  Surgeon: [name], MD;  Location: [place] [facility];  Service: Cardiovascular;  Laterality: N/A;  . EP IMPLANTABLE DEVICE N/A 10/06/2015   Procedure: Lead Revision/Repair;  Surgeon: [name], MD;  Location: [place] [facility];  Service: Cardiovascular;  Laterality: N/A;  . EYE SURGERY    . INSERT / REPLACE / REMOVE PACEMAKER  10/05/2015  . LEFT HEART CATHETERIZATION WITH CORONARY/GRAFT ANGIOGRAM N/A 09/01/2012   Procedure: LEFT HEART CATHETERIZATION WITH [name];  Surgeon: [name], MD;  Location: [facility] CATH LAB;  Service:  Cardiovascular;  Laterality: N/A;  . MEDIAN STERNOTOMY  08/23/10  . MOHS SURGERY  "@ least twice"  . PACEMAKER INSERTION     STJ 10/05/15  . PERCUTANEOUS CORONARY STENT INTERVENTION (PCI-S) N/A 10/14/2012   Procedure: PERCUTANEOUS CORONARY STENT INTERVENTION (PCI-S);  Surgeon: [name], MD;  Location: [facility] CATH LAB;  Service: Cardiovascular;  Laterality: N/A;  . PROSTATE BIOPSY    . TRANSURETHRAL RESECTION OF PROSTATE      Current Medications: Current Meds  Medication Sig  . apixaban (ELIQUIS) 5 MG TABS tablet Take 1 tablet (5 mg total) by mouth 2 (two) times daily.  . carvedilol (COREG) 6.25 MG tablet Take 1 tablet (6.25 mg total) by mouth 2 (two) times daily.  . Melatonin 5 MG TABS Take 5 mg by mouth as needed.  . nitroGLYCERIN  (NITROSTAT) 0.4 MG SL tablet Place 0.4 mg under the tongue every 5 (five) minutes x 3 doses as needed for chest pain.  . polyethylene glycol powder (GLYCOLAX/MIRALAX) powder Take 1 Container by mouth daily.   . potassium chloride SA (KLOR-CON M20) 20 MEQ tablet Take 1 tablet (20 mEq total) by mouth 2 (two) times daily.  . pravastatin (PRAVACHOL) 20 MG tablet TAKE 1 TABLET BY MOUTH  DAILY  . tamsulosin (FLOMAX) 0.4 MG CAPS capsule Take 0.4 mg by mouth daily.  Marland Kitchen torsemide (DEMADEX) 20 MG tablet Take 1 tablet (20 mg total) by mouth 2 (two) times daily.     Allergies:   Patient has no known allergies.   Social History   Socioeconomic History  . Marital status: Widowed    Spouse name: Not on file  . Number of children: Not on file  . Years of education: Not on file  . Highest education level: Not on file  Occupational History  . Occupation: retired Information systems manager: RETIRED  Social Needs  . Financial resource strain: Not on file  . Food insecurity:    Worry: Not on file    Inability: Not on file  . Transportation needs:    Medical: Not on file    Non-medical: Not on file  Tobacco Use  . Smoking status: Former Smoker    Packs/day: 1.00    Years: 30.00    Pack years: 30.00    Types: Cigarettes    Last attempt to quit: 09/07/1975    Years since quitting: 43.1  . Smokeless tobacco: Former Systems developer    Types: [place] date: 07/27/2015  Substance and Sexual Activity  . Alcohol use: No    Alcohol/week: 0.0 standard drinks  . Drug use: No  . Sexual activity: Not Currently  Lifestyle  . Physical activity:    Days per week: Not on file    Minutes per session: Not on file  . Stress: Not on file  Relationships  . Social connections:    Talks on phone: Not on file    Gets together: Not on file    Attends religious service: Not on file    Active member of club or organization: Not on file    Attends meetings of clubs or organizations: Not on file    Relationship status: Not on  file  Other Topics Concern  . Not on file  Social History Narrative  . Not on file     Family History:  The patient's family history includes Heart attack in his father; Heart disease in his brother; Hypertension in his mother; Kidney cancer in his father; Prostate cancer  in his brother; Skin cancer in his brother; Stroke in his mother.   ROS:   Please see the history of present illness.    Review of Systems  Constitution: Positive for malaise/fatigue.  HENT: Negative.   Cardiovascular: Positive for dyspnea on exertion and leg swelling.  Respiratory: Positive for shortness of breath and sleep disturbances due to breathing.   Endocrine: Negative.   Hematologic/Lymphatic: Negative.   Musculoskeletal: Negative.   Gastrointestinal: Negative.   Genitourinary: Negative.   Neurological: Negative.    All other systems reviewed and are negative.   PHYSICAL EXAM:   VS:  BP 128/76   Pulse 71   Ht _0  (1.753 m)   Wt 294 lb 12.8 oz (133.7 kg)   SpO2 94%   BMI 43.53 kg/m   Physical Exam  GEN: Obese, in no acute distress  HEENT: normal  Neck: Increased JVD, carotid bruits, or masses Cardiac irregular irregular with 1/6 systolic murmur at the left sternal border Respiratory: Bibasilar Rales GI: soft, nontender, nondistended, + BS Ext: 2-3+ edema bilaterally without cyanosis, clubbing, decreased distal pulses bilaterally MS: no deformity or atrophy  Skin: warm and dry, no rash Neuro:  Alert and Oriented x 3, Strength and sensation are intact Psych: euthymic mood, full affect  Wt Readings from Last 3 Encounters:  10/06/18 294 lb 12.8 oz (133.7 kg)  09/11/18 281 lb 6.4 oz (127.6 kg)  08/19/18 281 lb 14.1 oz (127.9 kg)      Studies/Labs Reviewed:   EKG:  EKG is not ordered today.   Recent Labs: 08/19/2018: Hemoglobin 13.8; Platelets 148 09/11/2018: NT-Pro BNP 2,599 10/01/2018: BUN 22; Creatinine, Ser 1.31; Potassium 4.3; Sodium 139   Lipid Panel    Component Value Date/Time    CHOL 113 06/01/2014 0930   TRIG 115.0 06/01/2014 0930   HDL 32.40 (L) 06/01/2014 0930   CHOLHDL 3 06/01/2014 0930   VLDL 23.0 06/01/2014 0930   LDLCALC 58 06/01/2014 0930   LDLDIRECT 70.7 06/10/2013 1344    Additional studies/ records that were reviewed today include:   2D echo 08/28/2018 Study Conclusions   - Left ventricle: The cavity size was normal. There was mild   concentric hypertrophy. Systolic function was mildly to   moderately reduced. The estimated ejection fraction was in the   range of 40% to 45%. Hypokinesis of the mid-apicalinferior,   inferoseptal, and apical myocardium. Features are consistent with   a pseudonormal left ventricular filling pattern, with concomitant   abnormal relaxation and increased filling pressure (grade 2   diastolic dysfunction). Doppler parameters are consistent with   indeterminate ventricular filling pressure. - Aortic valve: Transvalvular velocity was within the normal range.   There was no stenosis. There was no regurgitation. - Mitral valve: Calcified annulus. Transvalvular velocity was   within the normal range. There was no evidence for stenosis.   There was no regurgitation. - Right ventricle: The cavity size was normal. Wall thickness was   normal. Systolic function was normal. - Right atrium: The atrium was moderately dilated. - Atrial septum: The septum bowed from right to left, consistent   with increased right atrial pressure. - Tricuspid valve: There was mild regurgitation. - Pulmonary arteries: Systolic pressure could not be accurately   estimated.   ------------------------------------------------------------------- Labs, prior tests, procedures, and surgery: Permanent pacemaker system implantation.   Coronary artery bypass grafting.    Cardiac catheterization 02/10/2017 ([facility]) Coronary angiography reveals 3-vessel CAD.  LM is diffusely diseased with 50%  distal stenosis.   LAD has a  80% mid instent restenosis,  LCX has serial 50-60% stenoses in the mid segment, OM2 is a small vessel and has a 70% ostial stenosis.  RCA has long diffuse 50-60% proximal and 60% distal stenosis.  LIMA to LAD is patent with a kink in the mid segment. Flow is normal.  LV-Gram reveals EF 50%. LVEDP is 8 mmHg.  SVG grafts are known to be chronically occluded, so not injected    Nuclear stress test 01/21/2017 ([facility]) 1.  Moderate to severe inferoapical inducible ischemia with regadenoson 2.  Mild to moderate mid inferior and posterior basal defect. Cannot rule out prior infarct. 3.  Mild to moderate hypokinesis of the inferior wall. Dyskinesia/akinesia of the apex. 4.  Moderately decreased left ventricular ejection fraction at 43%. 5.  Please correlate with a cardiac ultrasound   Echo 06/20/2016 EF 81-85, grade 1 diastolic dysfunction, MAC, moderate LAE       ASSESSMENT:    1. Coronary artery disease involving native coronary artery of native heart without angina pectoris   2. Ischemic cardiomyopathy   3. Chronic combined systolic and diastolic CHF (congestive heart failure) ([place])   4. PAF (paroxysmal atrial fibrillation) ([facility])   5. Cardiac pacemaker      PLAN:  In order of problems listed above:  CAD status post previous CABG last cath at [facility] 01/2017 all vein grafts occluded as previously noted LIMA to the LAD patent with a kink in the mid segment but flow was normal LVEF 50%, medical therapy recommended  Ischemic cardiomyopathy ejection fraction 40 to 45% on most recent echo 01/26/2018  Acute on Chronic combined systolic and diastolic CHF  PAF on Eliquis  Status post cardiac pacemaker    Medication Adjustments/Labs and Tests Ordered: Current medicines are reviewed at length with the patient today.  Concerns regarding medicines are outlined above.  Medication changes, Labs and Tests ordered today are listed in the Patient  Instructions below. Patient Instructions  Medication Instructions:  Your physician recommends that you continue on your current medications as directed. Please refer to the Current Medication list given to you today.  If you need a refill on your cardiac medications before your next appointment, please call your pharmacy.   Lab work: None  If you have labs (blood work) drawn today and your tests are completely normal, you will receive your results only by: Marland Kitchen MyChart Message (if you have MyChart) OR . A paper copy in the mail If you have any lab test that is abnormal or we need to change your treatment, we will call you to review the results.  Testing/Procedures: None  Follow-Up: Please keep your appointment with Richardson Dopp PA-C on 10/27/2018 @ 11:15 PM  Any Other Special Instructions Will Be Listed Below (If Applicable).  PLEASE PROCEED TO [facility]      Signed, Ermalinda Barrios, [place]  10/06/2018 1:26 PM    [facility] [place], [place], [place]  [address] Phone: [phone]; Fax: [phone]

## 2018-10-06 NOTE — Progress Notes (Deleted)
For questions or updates, please contact [place] Please consult [email or web address] for contact info under        Signed, Ermalinda Barrios, PA-C  10/06/2018 1:09 PM     Cardiology Office Note    Date:  10/06/2018   ID:  ANTRONE WALLA, DOB 1929/05/11, MRN [id number]  PCP:  Bernerd Limbo, MD  Cardiologist: [name], MD EPS: Cristopher Peru, MD  No chief complaint on file.   History of Present Illness:  Levi [name] is a 85 y.o. male ***    Past Medical History:  Diagnosis Date  . Asthma   . Benign neoplasm of colon   . CAD (coronary artery disease) Dec 2011   s/p CABG per Dr. Roxy Manns; had normal EF; All SVGs occluded per follow up cath with only LIMA to LAD patent; s/p PCI of the proximal and mid LAD February 2014 per Dr. Burt Knack  . Dyspnea on exertion   . Edema   . Emphysema    "never treated for it" (10/05/2015)  . History of hiatal hernia   . HLD (hyperlipidemia)   . HTN (hypertension)   . Malaise and fatigue   . Obesity   . Osteopenia   . Pneumonia 1960s X 1; 1990s X 1  . Presence of permanent cardiac pacemaker   . Prostate cancer ([place])   . Skin cancer    "face, nose, top of ears, scalp"  . Symptomatic bradycardia    STJ PPM, 10/05/15, Dr. Lovena Le    Past Surgical History:  Procedure Laterality Date  . ANTERIOR CERVICAL DECOMP/DISCECTOMY FUSION    . BACK SURGERY    . C-EYE SURGERY PROCEDURE    . CATARACT EXTRACTION Right   . CORONARY ANGIOPLASTY WITH STENT PLACEMENT  10/14/12   with stent to proximal and mid LAD  . CORONARY ARTERY BYPASS GRAFT  08/23/10   "CABG X4"  . EP IMPLANTABLE DEVICE N/A 10/05/2015   Procedure: Pacemaker Implant;  Surgeon: [name], MD;  Location: [place] [facility];  Service: Cardiovascular;  Laterality: N/A;  . EP IMPLANTABLE DEVICE N/A 10/06/2015   Procedure: Lead Revision/Repair;  Surgeon: [name], MD;  Location: [place] [facility];  Service: Cardiovascular;  Laterality: N/A;  . EYE SURGERY    . INSERT / REPLACE /  REMOVE PACEMAKER  10/05/2015  . LEFT HEART CATHETERIZATION WITH CORONARY/GRAFT ANGIOGRAM N/A 09/01/2012   Procedure: LEFT HEART CATHETERIZATION WITH [name];  Surgeon: [name], MD;  Location: [facility] CATH LAB;  Service: Cardiovascular;  Laterality: N/A;  . MEDIAN STERNOTOMY  08/23/10  . MOHS SURGERY  "@ least twice"  . PACEMAKER INSERTION     STJ 10/05/15  . PERCUTANEOUS CORONARY STENT INTERVENTION (PCI-S) N/A 10/14/2012   Procedure: PERCUTANEOUS CORONARY STENT INTERVENTION (PCI-S);  Surgeon: [name], MD;  Location: [facility] C[facility];  Service: Cardiovascular;  Laterality: N/A;  . PROSTATE BIOPSY    . TRANSURETHRAL RESECTION OF PROSTATE      Current Medications: Current Meds  Medication Sig  . apixaban (ELIQUIS) 5 MG TABS tablet Take 1 tablet (5 mg total) by mouth 2 (two) times daily.  . carvedilol (COREG) 6.25 MG tablet Take 1 tablet (6.25 mg total) by mouth 2 (two) times daily.  . Melatonin 5 MG TABS Take 5 mg by mouth as needed.  . nitroGLYCERIN (NITROSTAT) 0.4 MG SL tablet Place 0.4 mg under the tongue every 5 (five) minutes x 3 doses as needed for chest pain.  . polyethylene glycol powder (  GLYCOLAX/MIRALAX) powder Take 1 Container by mouth daily.   . potassium chloride SA (KLOR-CON M20) 20 MEQ tablet Take 1 tablet (20 mEq total) by mouth 2 (two) times daily.  . pravastatin (PRAVACHOL) 20 MG tablet TAKE 1 TABLET BY MOUTH  DAILY  . tamsulosin (FLOMAX) 0.4 MG CAPS capsule Take 0.4 mg by mouth daily.  Marland Kitchen torsemide (DEMADEX) 20 MG tablet Take 1 tablet (20 mg total) by mouth 2 (two) times daily.     Allergies:   Patient has no known allergies.   Social History   Socioeconomic History  . Marital status: Widowed    Spouse name: Not on file  . Number of children: Not on file  . Years of education: Not on file  . Highest education level: Not on file  Occupational History  . Occupation: retired Information systems manager: RETIRED  Social Needs  . Financial resource  strain: Not on file  . Food insecurity:    Worry: Not on file    Inability: Not on file  . Transportation needs:    Medical: Not on file    Non-medical: Not on file  Tobacco Use  . Smoking status: Former Smoker    Packs/day: 1.00    Years: 30.00    Pack years: 30.00    Types: Cigarettes    Last attempt to quit: 09/07/1975    Years since quitting: 43.1  . Smokeless tobacco: Former Systems developer    Types: [place] date: 07/27/2015  Substance and Sexual Activity  . Alcohol use: No    Alcohol/week: 0.0 standard drinks  . Drug use: No  . Sexual activity: Not Currently  Lifestyle  . Physical activity:    Days per week: Not on file    Minutes per session: Not on file  . Stress: Not on file  Relationships  . Social connections:    Talks on phone: Not on file    Gets together: Not on file    Attends religious service: Not on file    Active member of club or organization: Not on file    Attends meetings of clubs or organizations: Not on file    Relationship status: Not on file  Other Topics Concern  . Not on file  Social History Narrative  . Not on file     Family History:  The patient's ***family history includes Heart attack in his father; Heart disease in his brother; Hypertension in his mother; Kidney cancer in his father; Prostate cancer in his brother; Skin cancer in his brother; Stroke in his mother.   ROS:   Please see the history of present illness.    ROS All other systems reviewed and are negative.   PHYSICAL EXAM:   VS:  BP 128/76   Pulse 71   Ht 5\' 9"  (1.753 m)   Wt 294 lb 12.8 oz (133.7 kg)   SpO2 94%   BMI 43.53 kg/m   Physical Exam  GEN: Well nourished, well developed, in no acute distress  HEENT: normal  Neck: no JVD, carotid bruits, or masses Cardiac:RRR; no murmurs, rubs, or gallops  Respiratory:  clear to auscultation bilaterally, normal work of breathing GI: soft, nontender, nondistended, + BS Ext: without cyanosis, clubbing, or edema, Good distal  pulses bilaterally MS: no deformity or atrophy  Skin: warm and dry, no rash Neuro:  Alert and Oriented x 3, Strength and sensation are intact Psych: euthymic mood, full affect  Wt Readings from Last 3 Encounters:  10/06/18 294 lb 12.8 oz (133.7 kg)  09/11/18 281 lb 6.4 oz (127.6 kg)  08/19/18 281 lb 14.1 oz (127.9 kg)      Studies/Labs Reviewed:   EKG:  EKG is*** ordered today.  The ekg ordered today demonstrates ***  Recent Labs: 08/19/2018: Hemoglobin 13.8; Platelets 148 09/11/2018: NT-Pro BNP 2,599 10/01/2018: BUN 22; Creatinine, Ser 1.31; Potassium 4.3; Sodium 139   Lipid Panel    Component Value Date/Time   CHOL 113 06/01/2014 0930   TRIG 115.0 06/01/2014 0930   HDL 32.40 (L) 06/01/2014 0930   CHOLHDL 3 06/01/2014 0930   VLDL 23.0 06/01/2014 0930   LDLCALC 58 06/01/2014 0930   LDLDIRECT 70.7 06/10/2013 1344    Additional studies/ records that were reviewed today include:  ***    ASSESSMENT:    1. Coronary artery disease involving native coronary artery of native heart without angina pectoris   2. Ischemic cardiomyopathy   3. Chronic combined systolic and diastolic CHF (congestive heart failure) ([place])   4. PAF (paroxysmal atrial fibrillation) ([facility])   5. Cardiac pacemaker      PLAN:  In order of problems listed above:      Medication Adjustments/Labs and Tests Ordered: Current medicines are reviewed at length with the patient today.  Concerns regarding medicines are outlined above.  Medication changes, Labs and Tests ordered today are listed in the Patient Instructions below. Patient Instructions  Medication Instructions:  Your physician recommends that you continue on your current medications as directed. Please refer to the Current Medication list given to you today.  If you need a refill on your cardiac medications before your next appointment, please call your pharmacy.   Lab work: None  If you have labs (blood work) drawn today and your tests  are completely normal, you will receive your results only by: Marland Kitchen MyChart Message (if you have MyChart) OR . A paper copy in the mail If you have any lab test that is abnormal or we need to change your treatment, we will call you to review the results.  Testing/Procedures: None  Follow-Up: Please keep your appointment with Richardson Dopp PA-C on 10/27/2018 @ 11:15 PM  Any Other Special Instructions Will Be Listed Below (If Applicable).  PLEASE PROCEED TO [facility]      Signed, Ermalinda Barrios, [place]  10/06/2018 1:11 PM    [facility] [place], [place], [place]  [address] Phone: [phone]; Fax: [phone]

## 2018-10-07 ENCOUNTER — Inpatient Hospital Stay (HOSPITAL_COMMUNITY): Payer: Medicare Other

## 2018-10-07 DIAGNOSIS — I5043 Acute on chronic combined systolic (congestive) and diastolic (congestive) heart failure: Secondary | ICD-10-CM

## 2018-10-07 LAB — BASIC METABOLIC PANEL
Anion gap: 13 (ref 5–15)
BUN: 22 mg/dL (ref 8–23)
CO2: 26 mmol/L (ref 22–32)
Calcium: 8.6 mg/dL — ABNORMAL LOW (ref 8.9–10.3)
Chloride: 100 mmol/L (ref 98–111)
Creatinine, Ser: 1.27 mg/dL — ABNORMAL HIGH (ref 0.61–1.24)
GFR calc Af Amer: 58 mL/min — ABNORMAL LOW (ref >60)
GFR calc non Af Amer: 50 mL/min — ABNORMAL LOW (ref >60)
Glucose, Bld: 91 mg/dL (ref 70–99)
Potassium: 4.1 mmol/L (ref 3.5–5.1)
Sodium: 139 mmol/L (ref 135–145)

## 2018-10-07 LAB — T4, FREE: Free T4: 0.96 ng/dL (ref 0.82–1.77)

## 2018-10-07 NOTE — Progress Notes (Addendum)
Progress Note  Patient Name: Levi [name] Date of Encounter: 10/07/2018  Primary Cardiologist: Levi Carnes, MD   Subjective   No significant overnight events. Patient states his shortness of breath is improving. No chest pain.  Inpatient Medications    Scheduled Meds: . apixaban  5 mg Oral BID  . aspirin EC  81 mg Oral Daily  . carvedilol  6.25 mg Oral BID WC  . furosemide  40 mg Intravenous BID  . polyethylene glycol  1 packet Oral Daily  . potassium chloride SA  20 mEq Oral BID  . pravastatin  20 mg Oral q1800  . tamsulosin  0.4 mg Oral QPC supper   Continuous Infusions:  PRN Meds: acetaminophen, nitroGLYCERIN, ondansetron (ZOFRAN) IV   Vital Signs    Vitals:   10/06/18 1413 10/06/18 2131 10/07/18 0420 10/07/18 0745  BP:  (!) 120/57 122/66 119/68  Pulse:  70 73 69  Resp:  20 18   Temp:  98.7 F (37.1 C) 98.1 F (36.7 C) 98.4 F (36.9 C)  TempSrc:  Oral Oral Oral  SpO2:  93% 96% 98%  Weight: 130.6 kg  127.9 kg   Height: 5\' 9"  (1.753 m)       Intake/Output Summary (Last 24 hours) at 10/07/2018 1105 Last data filed at 10/07/2018 0839 Gross per 24 hour  Intake 760 ml  Output 2875 ml  Net -2115 ml   Last 3 Weights 10/07/2018 10/06/2018 10/06/2018  Weight (lbs) 281 lb 14.4 oz 287 lb 14.4 oz 294 lb 12.8 oz  Weight (kg) 127.869 kg 130.591 kg 133.72 kg      Telemetry    Paced rhythm with heart rate in the 60's to 80's with PVCs.  - Personally Reviewed  ECG    No new ECG tracing. - Personally Reviewed  Physical Exam   GEN: Obese Caucasian male sitting comfortably in hospital chair. Alert and in no acute distress.   Neck: Supple. No significant JVD appreciated with patient sitting upright. Cardiac: RRR. Soft II/VI murmur best heard at apex. No rubs or gallops appreciated. Respiratory: No increased work of breathing. Crackles noted in bilateral bases.  GI: Abdomen soft, obese, and non-tender to palpation. Bowel sounds present. MS: 2-3+ pitting edema of  bilateral lower extremities. Skin: Warm and dry. Neuro:  No focal deficits. Psych: Normal affect. Responds appropriately.  Labs    Chemistry Recent Labs  Lab 10/01/18 1251 10/06/18 1441 10/07/18 0453  NA 139 138 139  K 4.3 4.1 4.1  CL 99 102 100  CO2 23 29 26   GLUCOSE 110* 94 91  BUN 22 26* 22  CREATININE 1.31* 1.24 1.27*  CALCIUM 9.0 8.8* 8.6*  PROT  --  6.8  --   ALBUMIN  --  3.4*  --   AST  --  19  --   ALT  --  14  --   ALKPHOS  --  88  --   BILITOT  --  0.6  --   GFRNONAA 49* 52* 50*  GFRAA 56* >60 58*  ANIONGAP  --  7 13     Hematology Recent Labs  Lab 10/06/18 1441  WBC 7.3  RBC 4.15*  HGB 12.5*  HCT 39.8  MCV 95.9  MCH 30.1  MCHC 31.4  RDW 14.8  PLT 118*    Cardiac EnzymesNo results for input(s): TROPONINI in the last 168 hours. No results for input(s): TROPIPOC in the last 168 hours.   BNP Recent Labs  Lab 10/06/18  1441  BNP 203.0*     DDimer No results for input(s): DDIMER in the last 168 hours.   Radiology    Dg Chest 2 View  Result Date: 10/07/2018 CLINICAL DATA:  CHF EXAM: CHEST - 2 VIEW COMPARISON:  08/06/2017 FINDINGS: Cardiomegaly accentuated by lower volumes. Dual-chamber pacer implant and CABG with left atrial clipping. Interstitial coarsening similar to multiple priors. No definite effusion. No pneumothorax. IMPRESSION: Mild interstitial coarsening presumably from mild edema given history of CHF. Electronically Signed   By: Monte Fantasia M.D.   On: 10/07/2018 08:48    Cardiac Studies   Echocardiogram 08/28/2018: Study Conclusions: - Left ventricle: The cavity size was normal. There was mild   concentric hypertrophy. Systolic function was mildly to   moderately reduced. The estimated ejection fraction was in the   range of 40% to 45%. Hypokinesis of the mid-apicalinferior,   inferoseptal, and apical myocardium. Features are consistent with   a pseudonormal left ventricular filling pattern, with concomitant   abnormal  relaxation and increased filling pressure (grade 2   diastolic dysfunction). Doppler parameters are consistent with   indeterminate ventricular filling pressure. - Aortic valve: Transvalvular velocity was within the normal range.   There was no stenosis. There was no regurgitation. - Mitral valve: Calcified annulus. Transvalvular velocity was   within the normal range. There was no evidence for stenosis.   There was no regurgitation. - Right ventricle: The cavity size was normal. Wall thickness was   normal. Systolic function was normal. - Right atrium: The atrium was moderately dilated. - Atrial septum: The septum bowed from right to left, consistent   with increased right atrial pressure. - Tricuspid valve: There was mild regurgitation. - Pulmonary arteries: Systolic pressure could not be accurately   estimated.  Patient Profile   Levi [name] is a 85 y.o. male with a history of CAD s/p CABG in 2011 and DES to the LAD in 2013, paroxysmal atrial fibrillation on Eliquis, symptomatic bradycardia Mobitz 2 s/p pacemaker placement in 0938, chronic diastolic CHF, hypertension, and hyperlipidemia who presented for outpatient follow-up on 10/06/2018 at which time he reported a 13 lb weight gain and increased shortness of breath. Therefore, patient was admitted for IV diuresis.  Assessment & Plan    Acute on Chronic Combined CHF/ Ischemic Cardiomyopathy  - Most recent Echo showed LVEF of 40-45% with hypokinesis of the mid-apicalinferior, inferoseptal, and apical myocardium as well as grade 2 diastolic dysfunction. - BNP 203.0 this admission. - TSH elevated at 7.828. Will check free T4 and T3.  - Diuresing well on IV Lasix. Documented output of 2.7 L in the past 24 hours. Weight down 6 lbs from yesterday. Serum creatinine stable. - Continue IV Lasix 40mg  twice daily. - Continue Coreg 6.25mg  twice daily. - Continue to monitor daily weights, strict I/O's, and renal function.  CAD s/p CABG -  Continue Aspirin, beta-blocker, and statin.  Paroxysmal Atrial Fibrillation - Currently in paced rhythm on telemetry. - Magnesium 2.4. - Potassium 4.1.  - Continue Coreg as above. - Continue chronic anticoagulation with Eliquis 5mg  twice daily.  History of Symptomatic Bradycardia Mobitz 2 s/p PPM - Telemetry shows paced rhythm with heart rate in the 60's to 80's.  Hypertension - BP currently well controlled at 119/68. - Continue Coreg as above.  Hyperlipidemia  - Continue home Pravastatin 20mg  daily.   For questions or updates, please contact [place] Please consult [email or web address] for contact info under        Signed,  Darreld Mclean, PA-C  10/07/2018, 11:05 AM   ---------------------------------------------------------------------------------------------   History and all data above reviewed.  Patient examined.  I agree with the findings as above.  Romell Wolden Trulock feels much better today after diuresis.  Constitutional: No acute distress Eyes: pupils equally round and reactive to light, sclera non-icteric, normal conjunctiva and lids ENMT: normal dentition, moist mucous membranes Cardiovascular: regular rhythm, normal rate, 1/6 SEM. S1 and S2 normal. Radial pulses normal bilaterally. Jugular venous distention at the lower 1/3 of neck while reclined to 30 deg.  Respiratory: faint bibasilar crackles GI : normal bowel sounds, soft and nontender. No distention.   MSK: extremities warm, well perfused. Bilateral edema.  NEURO: grossly nonfocal exam, moves all extremities. PSYCH: alert and oriented x 3, normal mood and affect.   All available labs, radiology testing, previous records reviewed. Agree with documented assessment and plan of my colleague as stated above with the following additions or changes:  Active Problems:   Acute on chronic combined systolic and diastolic CHF (congestive heart failure) (HCC)    Plan: Improving with diuresis, will likely need an  additional 1-2 days as weights improve. F/u TSH cascade. Remainder as above.  Time Spent Directly with Patient:  I have spent a total of 35 minutes with the patient reviewing hospital notes, telemetry, EKGs, labs and examining the patient as well as establishing an assessment and plan that was discussed personally with the patient.  > 50% of time was spent in direct patient care.  Length of Stay:  LOS: 1 day   Elouise Munroe, MD [facility] 5:21 PM  10/07/2018

## 2018-10-07 NOTE — Clinical Social Work Note (Signed)
CSW acknowledges SNF consult. PT recommending HHPT. RNCM is following.  CSW signing off. Consult again if any other social work needs arise.  Dayton Scrape, [place]

## 2018-10-07 NOTE — Evaluation (Signed)
Physical Therapy Evaluation Patient Details Name: Levi [name] MRN: [id number] DOB: 27-Oct-1928 Today's Date: 10/07/2018   History of Present Illness  Levi [name] is a 85 y.o. male with history of CAD status post CABG 2011 and DES to the LAD times 10/2011, PAF CHA2DS2-VASc equals 5 on apixaban, status post pacemaker 09/2015 for symptomatic bradycardia Mobitz 2, diastolic CHF, hypertension and HLD.  He went to [facility] for a second opinion 2018 and had a cardiac catheterization demonstrating patent LIMA to the LAD, 80% mid in-stent restenosis of the LAD, 50 to 60% mid circumflex, 70% ostial OM 2 stenosis, 60% proximal RCA and 60% distal RCA.  All vein grafts were previously known to be occluded.  No targets for PCI medical therapy recommended.  Most recent echo 09/11/2018 LVEF now 40 to 45% with inferior wall motion abnormality and moderate diastolic dysfunction.Unfortunately he is gained another 13 pounds and is having a lot of trouble with shortness of breath.  He could hardly walk in here without stopping several times.   Clinical Impression  Pt admitted with above diagnosis. Pt currently with functional limitations due to the deficits listed below (see PT Problem List). Pt was able to ambulate but guarded gait due to swollen LES and needing min guard assist for safety.  REcommended pt to use his RW for safety and he agrees he will as he was using it PTA.  Pt desat to 89% with activity on RA.  Will follow acutely.   Pt will benefit from skilled PT to increase their independence and safety with mobility to allow discharge to the venue listed below.    SATURATION QUALIFICATIONS: (This note is used to comply with regulatory documentation for home oxygen)  Patient Saturations on Room Air at Rest = 92%  Patient Saturations on Room Air while Ambulating = 89%  Patient Saturations on 0 Liters of oxygen while Ambulating = Did not test with O2 as pt did not drop below 89%.   Please briefly explain why  patient needs home oxygen:Does not appear that pt needs home O2 at this time.     Follow Up Recommendations Home health PT;Supervision - Intermittent    Equipment Recommendations  None recommended by PT    Recommendations for Other Services       Precautions / Restrictions Precautions Precautions: Fall Restrictions Weight Bearing Restrictions: No      Mobility  Bed Mobility Overal bed mobility: Independent             General bed mobility comments: Noted that catheter leaking.  Notified nursing. Cleaned pt and bed and pt not wet when PT left room.   Transfers Overall transfer level: Needs assistance Equipment used: None Transfers: Sit to/from Stand Sit to Stand: Min guard         General transfer comment: No assist needed.   Ambulation/Gait Ambulation/Gait assistance: Min guard Gait Distance (Feet): 115 Feet Assistive device: None Gait Pattern/deviations: Step-through pattern;Decreased stride length;Wide base of support   Gait velocity interpretation: <1.31 ft/sec, indicative of household ambulator General Gait Details: Pt did not lose balance however has guarded gait with wide BOS and short step length bilaterally.  Pt with no LOB however is at risk for falls due to gait pattern and recommended use of his RW for safety if pt discharges soon. Pt agrees.  Sats on RA at rest 92% and >.  Sats with activity on RA were 89% and >.    Stairs  Wheelchair Mobility    Modified Rankin (Stroke Patients Only)       Balance Overall balance assessment: Needs assistance Sitting-balance support: No upper extremity supported;Feet supported Sitting balance-Leahy Scale: Fair     Standing balance support: No upper extremity supported;During functional activity Standing balance-Leahy Scale: Fair Standing balance comment: can stand statically without assist and without UE support                             Pertinent Vitals/Pain Pain  Assessment: No/denies pain    Home Living Family/patient expects to be discharged to:: Private residence Living Arrangements: Alone Available Help at Discharge: Family;Available PRN/intermittently Type of Home: House Home Access: Stairs to enter Entrance Stairs-Rails: Right Entrance Stairs-Number of Steps: 2 Home Layout: One level Home Equipment: Cane - single point;Walker - 2 wheels;Walker - 4 wheels;Shower seat;Toilet riser;Grab bars - toilet Additional Comments: Reports children can assist prn    Prior Function Level of Independence: Independent         Comments: Pt still drives.     Hand Dominance   Dominant Hand: Right    Extremity/Trunk Assessment   Upper Extremity Assessment Upper Extremity Assessment: Defer to OT evaluation    Lower Extremity Assessment Lower Extremity Assessment: Generalized weakness    Cervical / Trunk Assessment Cervical / Trunk Assessment: Normal  Communication   Communication: No difficulties  Cognition Arousal/Alertness: Awake/alert Behavior During Therapy: WFL for tasks assessed/performed Overall Cognitive Status: Within Functional Limits for tasks assessed                                        General Comments General comments (skin integrity, edema, etc.): Catheter leaked in bed.  Cleaned pt and notified nursing to check catheter    Exercises General Exercises - Lower Extremity Ankle Circles/Pumps: AROM;Both;15 reps;Supine Long Arc Quad: AROM;Both;10 reps;Seated   Assessment/Plan    PT Assessment Patient needs continued PT services  PT Problem List Decreased activity tolerance;Decreased mobility;Decreased balance;Decreased knowledge of use of DME;Decreased safety awareness;Decreased knowledge of precautions;Cardiopulmonary status limiting activity       PT Treatment Interventions DME instruction;Gait training;Functional mobility training;Therapeutic exercise;Therapeutic activities;Stair training;Balance  training;Patient/family education    PT Goals (Current goals can be found in the Care Plan section)  Acute Rehab PT Goals Patient Stated Goal: to go home PT Goal Formulation: With patient Time For Goal Achievement: 10/21/18 Potential to Achieve Goals: Good    Frequency Min 3X/week   Barriers to discharge Decreased caregiver support(does not have 24 hour care but friend and children in/out)      Co-evaluation               AM-PAC PT "6 Clicks" Mobility  Outcome Measure Help needed turning from your back to your side while in a flat bed without using bedrails?: None Help needed moving from lying on your back to sitting on the side of a flat bed without using bedrails?: None Help needed moving to and from a bed to a chair (including a wheelchair)?: A Little Help needed standing up from a chair using your arms (e.g., wheelchair or bedside chair)?: A Little Help needed to walk in hospital room?: A Little Help needed climbing 3-5 steps with a railing? : A Lot 6 Click Score: 19    End of Session Equipment Utilized During Treatment: Gait belt Activity Tolerance: Patient  limited by fatigue Patient left: in chair;with call bell/phone within reach;with chair alarm set;with family/visitor present Nurse Communication: Mobility status PT Visit Diagnosis: Muscle weakness (generalized) (M62.81)    Time: 4707-6151 PT Time Calculation (min) (ACUTE ONLY): 35 min   Charges:   PT Evaluation $PT Eval Moderate Complexity: 1 Mod PT Treatments $Gait Training: 8-22 mins        [place] Pager:  [phone]  Office:  [place] 10/07/2018, 10:24 AM

## 2018-10-07 NOTE — Progress Notes (Signed)
SATURATION QUALIFICATIONS: (This note is used to comply with regulatory documentation for home oxygen)  Patient Saturations on Room Air at Rest = 92%  Patient Saturations on Room Air while Ambulating = 89%  Patient Saturations on 0 Liters of oxygen while Ambulating = Did not test with O2 as pt did not drop below 89%.   Please briefly explain why patient needs home oxygen:Does not appear that pt needs home O2 at this time.   [place] Pager:  [phone]  Office:  [phone]

## 2018-10-07 NOTE — Plan of Care (Signed)
Nutrition Education Note  RD consulted for nutrition education regarding CHF.  Spoke with pt, friend, and daughter at bedside. Pt reports that he started modifying his diet a few months ago, but reverted back to hold habits when he and his close friend (who often cooks for him) started feeling sick and eating out more frequently. Pt reports he consumes 3 meals per day (Breakfast: eggs, grits, coffee, and bacon OR oatmeal/cereal and fruit; Lunch: steak sandwich; Dinner: meat, starch, and vegetable OR soup).   Focus of discussion was on ways to choose healthier items when eating out and decreasing sodium in diet.   RD provided "Low Sodium Nutrition Therapy", "Sodium Free Seasoning Tips", and "Heart Healthy Label Reading Tips" handouts from the Academy of Nutrition and Dietetics. Reviewed patient's dietary recall. Provided examples on ways to decrease sodium intake in diet. Discouraged intake of processed foods and use of salt shaker. Encouraged fresh fruits and vegetables as well as whole grain sources of carbohydrates to maximize fiber intake.   RD discussed why it is important for patient to adhere to diet recommendations, and emphasized the role of fluids, foods to avoid, and importance of weighing self daily. Teach back method used.  Expect fair compliance.  Body mass index is 41.63 kg/m. Pt meets criteria for extreme obesity, class III based on current BMI.  Current diet order is 2 gram sodium, patient is consuming approximately 100% of meals at this time. Labs and medications reviewed. No further nutrition interventions warranted at this time. RD contact information provided. If additional nutrition issues arise, please re-consult RD.   Candia Kingsbury A. Jimmye Norman, RD, LDN, CDE Pager: [phone] After hours Pager: [phone]

## 2018-10-07 NOTE — Care Management Note (Signed)
Case Management Note  Patient Details  Name: Levi [name] MRN: [id number] Date of Birth: 1929-06-13  Subjective/Objective:       CHF            Action/Plan: Patient lives at home alone, supportive girlfriend that's very involved in his care; PCP:  Bernerd Limbo, MD; has private insurance with Medicare; pharmacy of choice is [facility] mail order and [facility]; patient reports no problem getting his medication; he continues to drive; eats a low sodium diet at his girlfriends home and is agreeable to have the Dietitian talk to him about making wise food choice when he eats out; No DME at this time; he has scales and knows to weigh himself daily; CM will continue to follow for progression of care.  Expected Discharge Date:    possibly 10/11/2018              Expected Discharge Plan:  Home/Self Care  In-House Referral:   Dietitian  Status of Service:  In process, will continue to follow  Sherrilyn Rist [phone] 10/07/2018, 10:26 AM

## 2018-10-08 LAB — BASIC METABOLIC PANEL
Anion gap: 12 (ref 5–15)
BUN: 22 mg/dL (ref 8–23)
CO2: 24 mmol/L (ref 22–32)
Calcium: 8.8 mg/dL — ABNORMAL LOW (ref 8.9–10.3)
Chloride: 102 mmol/L (ref 98–111)
Creatinine, Ser: 1.31 mg/dL — ABNORMAL HIGH (ref 0.61–1.24)
GFR calc Af Amer: 56 mL/min — ABNORMAL LOW (ref >60)
GFR calc non Af Amer: 49 mL/min — ABNORMAL LOW (ref >60)
GLUCOSE: 98 mg/dL (ref 70–99)
Potassium: 4 mmol/L (ref 3.5–5.1)
Sodium: 138 mmol/L (ref 135–145)

## 2018-10-08 LAB — T3, FREE: T3, Free: 2.5 pg/mL (ref 2.0–4.4)

## 2018-10-08 MED ORDER — TORSEMIDE 20 MG PO TABS
20.0000 mg | ORAL_TABLET | Freq: Two times a day (BID) | ORAL | Status: DC
  Administered 2018-10-09: 20 mg via ORAL
  Filled 2018-10-08: qty 1

## 2018-10-08 NOTE — Progress Notes (Addendum)
Progress Note  Patient Name: Levi [name] Date of Encounter: 10/08/2018  Primary Cardiologist: [name], MD   Subjective   No significant overnight events. Patient is feeling better today. Shortness of breath continues to improve. Patient still sleeping on an incline with BiPaP machine. No chest pain.  Inpatient Medications    Scheduled Meds: . apixaban  5 mg Oral BID  . aspirin EC  81 mg Oral Daily  . carvedilol  6.25 mg Oral BID WC  . furosemide  40 mg Intravenous BID  . polyethylene glycol  1 packet Oral Daily  . potassium chloride SA  20 mEq Oral BID  . pravastatin  20 mg Oral q1800  . tamsulosin  0.4 mg Oral QPC supper   Continuous Infusions:  PRN Meds: acetaminophen, nitroGLYCERIN, ondansetron (ZOFRAN) IV   Vital Signs    Vitals:   10/07/18 2100 10/08/18 0451 10/08/18 0754 10/08/18 0800  BP: 127/70 115/73 128/69 128/69  Pulse: 74 73 75 75  Resp: 20 18    Temp: 97.9 F (36.6 C) 98.1 F (36.7 C) 98.1 F (36.7 C) 98.1 F (36.7 C)  TempSrc: Oral Oral Oral Oral  SpO2: 95% 95% 97% 97%  Weight:  125.7 kg    Height:        Intake/Output Summary (Last 24 hours) at 10/08/2018 1022 Last data filed at 10/08/2018 0955 Gross per 24 hour  Intake 1140 ml  Output 3450 ml  Net -2310 ml   Last 3 Weights 10/08/2018 10/07/2018 10/06/2018  Weight (lbs) 277 lb 3.2 oz 281 lb 14.4 oz 287 lb 14.4 oz  Weight (kg) 125.737 kg 127.869 kg 130.591 kg      Telemetry    Paced rhythm with heart rate in the 70's to 80's with PVCs.  - Personally Reviewed  ECG    No new ECG tracing. - Personally Reviewed  Physical Exam   GEN: Obese Caucasian male sitting comfortably in hospital chair. Alert and in no acute distress.   Neck: Supple. Mild JVD appreciated with patient at 30 degrees. Cardiac: RRR. Soft II/VI murmur best heard at apex. No rubs or gallops appreciated. Respiratory: No increased work of breathing. Crackles noted in bilateral bases.  GI: Abdomen soft, obese, and  non-tender to palpation. Bowel sounds present. MS: Tight pitting edema of bilateral lower extremities. Skin: Warm and dry. Neuro:  No focal deficits. Psych: Normal affect. Responds appropriately.  Labs    Chemistry Recent Labs  Lab 10/06/18 1441 10/07/18 0453 10/08/18 0536  NA 138 139 138  K 4.1 4.1 4.0  CL 102 100 102  CO2 29 26 24   GLUCOSE 94 91 98  BUN 26* 22 22  CREATININE 1.24 1.27* 1.31*  CALCIUM 8.8* 8.6* 8.8*  PROT 6.8  --   --   ALBUMIN 3.4*  --   --   AST 19  --   --   ALT 14  --   --   ALKPHOS 88  --   --   BILITOT 0.6  --   --   GFRNONAA 52* 50* 49*  GFRAA >60 58* 56*  ANIONGAP 7 13 12      Hematology Recent Labs  Lab 10/06/18 1441  WBC 7.3  RBC 4.15*  HGB 12.5*  HCT 39.8  MCV 95.9  MCH 30.1  MCHC 31.4  RDW 14.8  PLT 118*    Cardiac EnzymesNo results for input(s): TROPONINI in the last 168 hours. No results for input(s): TROPIPOC in the last 168 hours.  BNP Recent Labs  Lab 10/06/18 1441  BNP 203.0*     DDimer No results for input(s): DDIMER in the last 168 hours.   Radiology    Dg Chest 2 View  Result Date: 10/07/2018 CLINICAL DATA:  CHF EXAM: CHEST - 2 VIEW COMPARISON:  08/06/2017 FINDINGS: Cardiomegaly accentuated by lower volumes. Dual-chamber pacer implant and CABG with left atrial clipping. Interstitial coarsening similar to multiple priors. No definite effusion. No pneumothorax. IMPRESSION: Mild interstitial coarsening presumably from mild edema given history of CHF. Electronically Signed   By: Monte Fantasia M.D.   On: 10/07/2018 08:48    Cardiac Studies   Echocardiogram 08/28/2018: Study Conclusions: - Left ventricle: The cavity size was normal. There was mild   concentric hypertrophy. Systolic function was mildly to   moderately reduced. The estimated ejection fraction was in the   range of 40% to 45%. Hypokinesis of the mid-apicalinferior,   inferoseptal, and apical myocardium. Features are consistent with   a  pseudonormal left ventricular filling pattern, with concomitant   abnormal relaxation and increased filling pressure (grade 2   diastolic dysfunction). Doppler parameters are consistent with   indeterminate ventricular filling pressure. - Aortic valve: Transvalvular velocity was within the normal range.   There was no stenosis. There was no regurgitation. - Mitral valve: Calcified annulus. Transvalvular velocity was   within the normal range. There was no evidence for stenosis.   There was no regurgitation. - Right ventricle: The cavity size was normal. Wall thickness was   normal. Systolic function was normal. - Right atrium: The atrium was moderately dilated. - Atrial septum: The septum bowed from right to left, consistent   with increased right atrial pressure. - Tricuspid valve: There was mild regurgitation. - Pulmonary arteries: Systolic pressure could not be accurately   estimated.  Patient Profile   Mr. Richert is a 85 y.o. male with a history of CAD s/p CABG in 2011 and DES to the LAD in 2013, paroxysmal atrial fibrillation on Eliquis, symptomatic bradycardia Mobitz 2 s/p pacemaker placement in 6195, chronic diastolic CHF, hypertension, and hyperlipidemia who presented for outpatient follow-up on 10/06/2018 at which time he reported a 13 lb weight gain and increased shortness of breath. Therefore, patient was admitted for IV diuresis.  Assessment & Plan    Acute on Chronic Combined CHF/ Ischemic Cardiomyopathy  - Most recent Echo showed LVEF of 40-45% with hypokinesis of the mid-apicalinferior, inferoseptal, and apical myocardium as well as grade 2 diastolic dysfunction. - BNP 203.0 this admission. - TSH elevated at 7.828. Free T4 and free T3 normal. Will defer to PCP. - Diuresing well on IV Lasix. Documented output of 3 L in the past 24 hours. Weight 277.2 lbs today, down 4 lbs from yesterday. Per chart review, dry weight appears to be around 281 lbs on previous office visits.  -  Serum creatinine slightly elevated today at 1.31 compared to yesterday. Will continue to monitor this closely. - Continue IV Lasix 40mg  twice daily. - Continue Coreg 6.25mg  twice daily. - Continue to monitor daily weights, strict I/O's, and renal function.  CAD s/p CABG - Continue Aspirin, beta-blocker, and statin.  Paroxysmal Atrial Fibrillation - Currently in paced rhythm on telemetry. - Magnesium 2.4. - Potassium 4.0.  - Continue Coreg as above. - Continue chronic anticoagulation with Eliquis 5mg  twice daily.  History of Symptomatic Bradycardia Mobitz 2 s/p PPM - Telemetry shows paced rhythm with heart rate in the 70's to 80's.  Hypertension - BP currently well  controlled at 128/69. - Continue Coreg as above.  Hyperlipidemia  - Continue home Pravastatin 20mg  daily.   For questions or updates, please contact [place] Please consult [email or web address] for contact info under        Signed, Darreld Mclean, PA-C  10/08/2018, 10:22 AM    ---------------------------------------------------------------------------------------------   History and all data above reviewed.  Patient examined.  I agree with the findings as above.  Berley Gambrell Joaquin feels improved and is with his family today.   Constitutional: No acute distress Cardiovascular: regular rhythm, normal rate, no murmurs. S1 and S2 normal. Radial pulses normal bilaterally. jugular venous distention to lower 1/3 of neck.  Respiratory: bibasilar crackles GI : normal bowel sounds, soft and nontender. No distention.   MSK: extremities warm, well perfused. 1+ bilateral edema.  NEURO: grossly nonfocal exam, moves all extremities. PSYCH: alert and oriented x 3, normal mood and affect.   All available labs, radiology testing, previous records reviewed. Agree with documented assessment and plan of my colleague as stated above with the following additions or changes:  Active Problems:   Acute on chronic combined systolic  and diastolic CHF (congestive heart failure) (HCC)    Plan: we discussed in detail heart failure education, dietary recommendations, fluid restriction, and overall salt intake guidelines. I have answered his family's questions to the best of my ability. We will continue IV lasix today but can likely transition to oral tomorrow if he continues to improve. Will restart torsemide 20 mg Bid and can arrange close follow up to ensure that renal function is not worsening or diuresis does not need de-escalation.  Time Spent Directly with Patient:  I have spent a total of 35 minutes with the patient reviewing hospital notes, telemetry, EKGs, labs and examining the patient as well as establishing an assessment and plan that was discussed personally with the patient.  > 50% of time was spent in direct patient care.  Length of Stay:  LOS: 2 days   Elouise Munroe, MD [facility] 6:21 PM  10/08/2018

## 2018-10-09 ENCOUNTER — Other Ambulatory Visit: Payer: Self-pay | Admitting: Student

## 2018-10-09 DIAGNOSIS — N182 Chronic kidney disease, stage 2 (mild): Secondary | ICD-10-CM

## 2018-10-09 DIAGNOSIS — N289 Disorder of kidney and ureter, unspecified: Secondary | ICD-10-CM

## 2018-10-09 DIAGNOSIS — N1832 Chronic kidney disease, stage 3b: Secondary | ICD-10-CM

## 2018-10-09 DIAGNOSIS — N179 Acute kidney failure, unspecified: Secondary | ICD-10-CM

## 2018-10-09 LAB — BASIC METABOLIC PANEL
Anion gap: 11 (ref 5–15)
BUN: 26 mg/dL — ABNORMAL HIGH (ref 8–23)
CALCIUM: 8.9 mg/dL (ref 8.9–10.3)
CO2: 27 mmol/L (ref 22–32)
CREATININE: 1.42 mg/dL — AB (ref 0.61–1.24)
Chloride: 101 mmol/L (ref 98–111)
GFR calc Af Amer: 51 mL/min — ABNORMAL LOW (ref >60)
GFR calc non Af Amer: 44 mL/min — ABNORMAL LOW (ref >60)
Glucose, Bld: 106 mg/dL — ABNORMAL HIGH (ref 70–99)
Potassium: 4.4 mmol/L (ref 3.5–5.1)
Sodium: 139 mmol/L (ref 135–145)

## 2018-10-09 MED ORDER — TORSEMIDE 10 MG PO TABS: 10.0000 mg | ORAL_TABLET | Freq: Two times a day (BID) | ORAL | 2 refills | Status: DC

## 2018-10-09 MED ORDER — TORSEMIDE 20 MG PO TABS: 10.0000 mg | ORAL_TABLET | Freq: Two times a day (BID) | ORAL | Status: DC

## 2018-10-09 NOTE — Care Management Important Message (Signed)
Important Message  Patient Details  Name: Levi [name] MRN: [id number] Date of Birth: 1928/09/03   Medicare Important Message Given:  Yes    [name] P [place] 10/09/2018, 1:34 PM

## 2018-10-09 NOTE — Progress Notes (Signed)
Physical Therapy Treatment Patient Details Name: Levi [name] MRN: [id number] DOB: May 03, 1929 Today's Date: 10/09/2018    History of Present Illness Pt is an 85 y.o. male admitted 10/06/18 with weight gain and SOB; worked up for CHF. PMH includes CAD s/p CABG, PAF, pacemaker, CHF, HTN.   PT Comments    Pt progressing well with mobility. Ambulating without device at supervision-level. Intermittent standing rest breaks secondary to DOE; pt able to carry conversation while walking. SpO2 >89% on RA. Encouraged continued ambulation with family and/or nursing supervision.    Follow Up Recommendations  Home health PT;Supervision - Intermittent     Equipment Recommendations  None recommended by PT    Recommendations for Other Services       Precautions / Restrictions Precautions Precautions: Fall Restrictions Weight Bearing Restrictions: No    Mobility  Bed Mobility               General bed mobility comments: Seated in recliner  Transfers Overall transfer level: Independent Equipment used: None Transfers: Sit to/from Stand              Ambulation/Gait Ambulation/Gait assistance: Supervision Gait Distance (Feet): 450 Feet Assistive device: None Gait Pattern/deviations: Step-through pattern;Decreased stride length;Wide base of support Gait velocity: Decreased Gait velocity interpretation: 1.31 - 2.62 ft/sec, indicative of limited community ambulator General Gait Details: Slow, steady gait with wide BOS; 4x standing rest break secondary to DOE 2/4 and fatigue. Able to carry conversation throughout walk. SpO2 >89% on RA   Stairs             Wheelchair Mobility    Modified Rankin (Stroke Patients Only)       Balance Overall balance assessment: Needs assistance Sitting-balance support: No upper extremity supported;Feet supported Sitting balance-Leahy Scale: Good     Standing balance support: No upper extremity supported;During functional  activity Standing balance-Leahy Scale: Good                              Cognition Arousal/Alertness: Awake/alert Behavior During Therapy: WFL for tasks assessed/performed Overall Cognitive Status: Within Functional Limits for tasks assessed                                        Exercises      General Comments General comments (skin integrity, edema, etc.): Family present and supportive      Pertinent Vitals/Pain Pain Assessment: No/denies pain    Home Living                      Prior Function            PT Goals (current goals can now be found in the care plan section) Acute Rehab PT Goals Patient Stated Goal: to go home PT Goal Formulation: With patient Time For Goal Achievement: 10/21/18 Potential to Achieve Goals: Good Progress towards PT goals: Progressing toward goals    Frequency    Min 3X/week      PT Plan Current plan remains appropriate    Co-evaluation              AM-PAC PT "6 Clicks" Mobility   Outcome Measure  Help needed turning from your back to your side while in a flat bed without using bedrails?: None Help needed moving from lying on your back to sitting on  the side of a flat bed without using bedrails?: None Help needed moving to and from a bed to a chair (including a wheelchair)?: None Help needed standing up from a chair using your arms (e.g., wheelchair or bedside chair)?: A Little Help needed to walk in hospital room?: A Little Help needed climbing 3-5 steps with a railing? : A Little 6 Click Score: 21    End of Session Equipment Utilized During Treatment: Gait belt Activity Tolerance: Patient tolerated treatment well Patient left: in chair;with call bell/phone within reach;with family/visitor present Nurse Communication: Mobility status PT Visit Diagnosis: Muscle weakness (generalized) (M62.81)     Time: 3582-5189 PT Time Calculation (min) (ACUTE ONLY): 18 min  Charges:  $Gait  Training: 8-22 mins                    Mabeline Caras, PT, DPT Acute Rehabilitation Services  Pager [phone] Office [place] 10/09/2018, 12:36 PM

## 2018-10-09 NOTE — Progress Notes (Addendum)
Progress Note  Patient Name: NYAIR DEPAULO Date of Encounter: 10/09/2018  Primary Cardiologist: [name], MD   Subjective   No significant overnight events. Patient continues to feel better today. Breathing improving. He was able to ambulate this morning. He had to stop once due to shortness of breath but states it was not as bad as it was prior to admission. No chest pain.   Inpatient Medications    Scheduled Meds: . apixaban  5 mg Oral BID  . aspirin EC  81 mg Oral Daily  . carvedilol  6.25 mg Oral BID WC  . polyethylene glycol  1 packet Oral Daily  . potassium chloride SA  20 mEq Oral BID  . pravastatin  20 mg Oral q1800  . tamsulosin  0.4 mg Oral QPC supper  . torsemide  20 mg Oral BID   Continuous Infusions:  PRN Meds: acetaminophen, nitroGLYCERIN, ondansetron (ZOFRAN) IV   Vital Signs    Vitals:   10/08/18 0800 10/08/18 1200 10/08/18 2016 10/09/18 0535  BP: 128/69 128/76 119/67 113/68  Pulse: 75 70 71 78  Resp:   20 20  Temp: 98.1 F (36.7 C) 98.2 F (36.8 C) 97.8 F (36.6 C) 97.6 F (36.4 C)  TempSrc: Oral Oral Oral Oral  SpO2: 97% 94% 94% 97%  Weight:    124 kg  Height:        Intake/Output Summary (Last 24 hours) at 10/09/2018 1048 Last data filed at 10/09/2018 0954 Gross per 24 hour  Intake 720 ml  Output 1525 ml  Net -805 ml   Last 3 Weights 10/09/2018 10/08/2018 10/07/2018  Weight (lbs) 273 lb 4.8 oz 277 lb 3.2 oz 281 lb 14.4 oz  Weight (kg) 123.968 kg 125.737 kg 127.869 kg      Telemetry    Paced rhythm.  - Personally Reviewed   ECG    No new ECG tracing. - Personally Reviewed  Physical Exam   GEN: Obese Caucasian male resting comfortably.  Alert and in no acute distress.   Neck: Supple. Mild JVD appreciated with patient at 30 degrees. Cardiac: RRR. Soft II/VI murmur best heard at apex. No rubs or gallops appreciated. Respiratory: No increased work of breathing. Crackles noted in bilateral bases.  GI: Abdomen soft, obese, and non-tender  to palpation. Bowel sounds present. MS: Tight pitting edema of bilateral lower extremities. Skin: Warm and dry. Neuro:  No focal deficits. Psych: Normal affect. Responds appropriately.  Labs    Chemistry Recent Labs  Lab 10/06/18 1441 10/07/18 0453 10/08/18 0536 10/09/18 0448  NA 138 139 138 139  K 4.1 4.1 4.0 4.4  CL 102 100 102 101  CO2 29 26 24 27   GLUCOSE 94 91 98 106*  BUN 26* 22 22 26*  CREATININE 1.24 1.27* 1.31* 1.42*  CALCIUM 8.8* 8.6* 8.8* 8.9  PROT 6.8  --   --   --   ALBUMIN 3.4*  --   --   --   AST 19  --   --   --   ALT 14  --   --   --   ALKPHOS 88  --   --   --   BILITOT 0.6  --   --   --   GFRNONAA 52* 50* 49* 44*  GFRAA >60 58* 56* 51*  ANIONGAP 7 13 12 11      Hematology Recent Labs  Lab 10/06/18 1441  WBC 7.3  RBC 4.15*  HGB 12.5*  HCT 39.8  MCV 95.9  MCH 30.1  MCHC 31.4  RDW 14.8  PLT 118*    Cardiac EnzymesNo results for input(s): TROPONINI in the last 168 hours. No results for input(s): TROPIPOC in the last 168 hours.   BNP Recent Labs  Lab 10/06/18 1441  BNP 203.0*     DDimer No results for input(s): DDIMER in the last 168 hours.   Radiology    No results found.  Cardiac Studies   Echocardiogram 08/28/2018: Study Conclusions: - Left ventricle: The cavity size was normal. There was mild   concentric hypertrophy. Systolic function was mildly to   moderately reduced. The estimated ejection fraction was in the   range of 40% to 45%. Hypokinesis of the mid-apicalinferior,   inferoseptal, and apical myocardium. Features are consistent with   a pseudonormal left ventricular filling pattern, with concomitant   abnormal relaxation and increased filling pressure (grade 2   diastolic dysfunction). Doppler parameters are consistent with   indeterminate ventricular filling pressure. - Aortic valve: Transvalvular velocity was within the normal range.   There was no stenosis. There was no regurgitation. - Mitral valve: Calcified  annulus. Transvalvular velocity was   within the normal range. There was no evidence for stenosis.   There was no regurgitation. - Right ventricle: The cavity size was normal. Wall thickness was   normal. Systolic function was normal. - Right atrium: The atrium was moderately dilated. - Atrial septum: The septum bowed from right to left, consistent   with increased right atrial pressure. - Tricuspid valve: There was mild regurgitation. - Pulmonary arteries: Systolic pressure could not be accurately   estimated.  Patient Profile   Mr. Presley is a 85 y.o. male with a history of CAD s/p CABG in 2011 and DES to the LAD in 2013, paroxysmal atrial fibrillation on Eliquis, symptomatic bradycardia Mobitz 2 s/p pacemaker placement in 1448, chronic diastolic CHF, hypertension, and hyperlipidemia who presented for outpatient follow-up on 10/06/2018 at which time he reported a 13 lb weight gain and increased shortness of breath. Therefore, patient was admitted for IV diuresis.  Assessment & Plan    Acute on Chronic Combined CHF/ Ischemic Cardiomyopathy  - Most recent Echo showed LVEF of 40-45% with hypokinesis of the mid-apicalinferior, inferoseptal, and apical myocardium as well as grade 2 diastolic dysfunction. - BNP 203.0 this admission. - TSH elevated at 7.828. Free T4 and free T3 normal. Will defer to PCP. - Diuresing well on IV Lasix. Documented output of 2 L in the past 24 hours and net negative 5.3 L since admission . Weight 273.3 lbs today, down 4 lbs from yesterday and 14 lbs since admission. Per chart review, dry weight appears to be around 281 lbs on previous office visits.  - Serum creatinine continues to rise and is 1.42 today. - Patient was transitioned to PO Torsemide 20mg  twice daily this morning. However, serum creatinine continues to rise and is 1.42 today. Discussed with MD - will hold Torsemide dose this evening and will start Torsemide 10mg  twice daily tomorrow.  - Continue Coreg  6.25mg  twice daily. - Continue to monitor daily weights, strict I/O's, and renal function.  CAD s/p CABG - Continue Aspirin, beta-blocker, and statin.  Paroxysmal Atrial Fibrillation - Currently in paced rhythm on telemetry. - Magnesium 2.4. - Potassium 4.4.  - Continue Coreg as above. - Continue chronic anticoagulation with Eliquis 5mg  twice daily.  History of Symptomatic Bradycardia Mobitz 2 s/p PPM - Telemetry shows paced rhythm with heart rate in the  70's to 80's.  Hypertension - BP currently well controlled at 128/69. - Continue Coreg as above.  Hyperlipidemia  - Continue home Pravastatin 20mg  daily.   For questions or updates, please contact [place] Please consult [email or web address] for contact info under        Signed, Darreld Mclean, PA-C  10/09/2018, 10:48 AM   ---------------------------------------------------------------------------------------------   History and all data above reviewed.  Patient examined.  I agree with the findings as above.  Josue Falconi Castille feels well and is eager to go home.   Constitutional: No acute distress Cardiovascular: regular rhythm, normal rate, no murmurs. S1 and S2 normal. Radial pulses normal bilaterally. No jugular venous distention.  Respiratory: clear to auscultation bilaterally GI : normal bowel sounds, soft and nontender. No distention.   MSK: extremities warm, well perfused. Trace edema.  NEURO: grossly nonfocal exam, moves all extremities. PSYCH: alert and oriented x 3, normal mood and affect.   All available labs, radiology testing, previous records reviewed. Agree with documented assessment and plan of my colleague as stated above with the following additions or changes:  Principal Problem:   Acute on chronic combined systolic and diastolic CHF (congestive heart failure) ([facility]) Active Problems:   HLD (hyperlipidemia)   Essential hypertension   PAF (paroxysmal atrial fibrillation) (HCC)   CAD (coronary artery  disease)    Plan: Though he has had mildly impaired renal function as a result of adequate diuresis, I believe he is near euvolemia, and can likely have very close follow-up next week with a creatinine and potassium on Monday and an office visit on Wednesday to ensure that he continues to diurese adequately.  I ideally we would have him discharged on torsemide 20 mg twice daily however due to impaired renal function we will decrease this dose to 10 mg twice daily home-going which can be uptitrated on Wednesday at his follow-up so long his renal function is intact.  I have discussed heart failure education and diet and salt recommendations with the patient and his wife and they demonstrated understanding.  I have recommended a no salt diet, fluid restriction to approximately 1.5 L, and activity to encourage diuresis.  He demonstrated understanding and had no additional questions.  I have answered his and his wife's questions to the best my ability.  With a plan for close follow-up, I believe he is ready for discharge today.  He is very happy with this plan and will call us with any questions or concerns.  Time Spent Directly with Patient:  I have spent a total of 35 minutes with the patient reviewing hospital notes, telemetry, EKGs, labs and examining the patient as well as establishing an assessment and plan that was discussed personally with the patient.  > 50% of time was spent in direct patient care.  Length of Stay:  LOS: 3 days   Elouise Munroe, MD [facility] 2:40 PM  10/09/2018

## 2018-10-09 NOTE — Discharge Summary (Addendum)
Discharge Summary    Patient ID: Levi [name] MRN: [id number]; DOB: [birth year]  Admit date: 10/06/2018 Discharge date: 10/09/2018  Primary Care Provider: Bernerd Limbo, MD  Primary Cardiologist: [name], MD  Primary Electrophysiologist:  Cristopher Peru, MD   Discharge Diagnoses    Principal Problem:   Acute on chronic combined systolic and diastolic CHF (congestive heart failure) ([place]) Active Problems:   HLD (hyperlipidemia)   Essential hypertension   PAF (paroxysmal atrial fibrillation) (H[facility])   CAD (coronary artery disease)   Renal insufficiency   Allergies No Known Allergies  Diagnostic Studies/Procedures    Chest X-Ray 10/07/2018: Impression: Mild interstitial coarsening presumably from mild edema given history of CHF.   History of Present Illness     Levi [name] is a 85 y.o. male with a history of CAD s/p CABG in 2011 and DES to the LAD in 2013, paroxysmal atrial fibrillation on Eliquis, symptomatic bradycardia Mobitz 2 s/p pacemaker placement in 4098, chronic diastolic CHF, hypertension, and hyperlipidemia who is followed by Dr. Harrington Challenger. He went to [facility] for a second opinion 2018 and had a cardiac catheterization demonstrating patent LIMA to the LAD, 80% mid in-stent restenosis of the LAD, 50 to 60% mid circumflex, 70% ostial OM 2 stenosis, 60% proximal RCA and 60% distal RCA.  All vein grafts were previously known to be occluded.  No targets for PCI medical therapy recommended.  Most recent echo 09/11/2018 LVEF now 40 to 45% with inferior wall motion abnormality and moderate diastolic dysfunction.  Patient presented to our office on 10/06/2018 and was accompanied by his son. Unfortunately, he reported another 13 pound weight gain and was having a lot of trouble with shortness of breath.  He could hardly walk into the office without stopping several times. BNP was 2,599 on 09/11/2018 and he weighed 281 lbs at that time. At office visit, his weight was up to 294 lbs. He does  eat out a lot but reports mostly home cooking almost daily. Serum creatinine jumped up to 1.3 and Demadex was decreased. The decision was made to admit the patient for IV diuresis.   Hospital Course     Consultants: None.  Mr. Kushner was admitted on 10/06/2018 for acute on chronic combine CHF. Chest x-ray showed mild interstitial coarsening presumably from mild edema. BNP was 203.0. Patient was diuresed on IV Lasix with good response.  He is net negative 5.3 L and down 14 lbs since admission. Weight today is 273.3 lbs. Serum creatinine has been slowly increasing with IV diuresis and is 1.42 today. Patient reports is feeling much better and reports significant improvement in his shortness of breath. He has been able to ambulate without any significant problems. - Will start Torsemide 10mg  twice daily tomorrow. - Continue home Coreg 6.25mg  twice daily. - Patient not currently on any ACEi/ARB. Will not start one at this time given renal function. Will defer to primary Cardiologist. - Will recheck BMET on Monday 10/12/2018 and follow-up in office on 10/14/2018.   Of note, patient TSH was elevated at 7.828. Free T4 and free T3 were both normal. Will defer to patient's PCP.   Patient seen and examined by Dr. Margaretann Loveless today and determined to be stable for discharge. Outpatient follow-up has been arranged. Medications as below.   Discharge Vitals Blood pressure 116/60, pulse 70, temperature (!) 97.4 F (36.3 C), temperature source Oral, resp. rate 20, height 5\' 9"  (1.753 m), weight 124 kg, SpO2 95 %.  Filed Weights   10/07/18 0420  10/08/18 0451 10/09/18 0535  Weight: 127.9 kg 125.7 kg 124 kg    Labs & Radiologic Studies    CBC No results for input(s): WBC, NEUTROABS, HGB, HCT, MCV, PLT in the last 72 hours. Basic Metabolic Panel Recent Labs    10/08/18 0536 10/09/18 0448  NA 138 139  K 4.0 4.4  CL 102 101  CO2 24 27  GLUCOSE 98 106*  BUN 22 26*  CREATININE 1.31* 1.42*  CALCIUM 8.8* 8.9    Liver Function Tests No results for input(s): AST, ALT, ALKPHOS, BILITOT, PROT, ALBUMIN in the last 72 hours. No results for input(s): LIPASE, AMYLASE in the last 72 hours. Cardiac Enzymes No results for input(s): CKTOTAL, CKMB, CKMBINDEX, TROPONINI in the last 72 hours. BNP Invalid input(s): POCBNP D-Dimer No results for input(s): DDIMER in the last 72 hours. Hemoglobin A1C No results for input(s): HGBA1C in the last 72 hours. Fasting Lipid Panel No results for input(s): CHOL, HDL, LDLCALC, TRIG, CHOLHDL, LDLDIRECT in the last 72 hours. Thyroid Function Tests Recent Labs    10/07/18 1203  T3FREE 2.5   _____________  Dg Chest 2 View  Result Date: 10/07/2018 CLINICAL DATA:  CHF EXAM: CHEST - 2 VIEW COMPARISON:  08/06/2017 FINDINGS: Cardiomegaly accentuated by lower volumes. Dual-chamber pacer implant and CABG with left atrial clipping. Interstitial coarsening similar to multiple priors. No definite effusion. No pneumothorax. IMPRESSION: Mild interstitial coarsening presumably from mild edema given history of CHF. Electronically Signed   By: Monte Fantasia M.D.   On: 10/07/2018 08:48   Disposition   Patient is being discharged home today in good condition.  Follow-up Plans & Appointments    Follow-up Information    [facility] Follow up.   Specialty:  Cardiology Why:  Please come by our office on Monday 10/12/2018 for a lab visit so that we can recheck your renal function. You can come by anytime from 7:30am to 4:00pm.  Contact information: [address] [place]       Daune Perch, NP Follow up.   Specialty:  Cardiology Why:  You have a hospital follow-up visit scheduled for 10/14/2018 at 10:30am with Pecolia Ades, one of Dr. Alan Ripper NPs. Please arrive 15 minutes early for check in. Contact information: [address] [place] [address] [phone]        Bernerd Limbo, MD  Follow up.   Specialty:  Family Medicine Why:  Please call and make a follow-up appointment with your primary care physician in the next 1-2 weeks.  Contact information: [address] [place] [address] [phone]          Discharge Instructions    Diet - low sodium heart healthy   Complete by:  As directed    Increase activity slowly   Complete by:  As directed       Discharge Medications   Allergies as of 10/09/2018   No Known Allergies     Medication List    TAKE these medications   apixaban 5 MG Tabs tablet Commonly known as:  ELIQUIS Take 1 tablet (5 mg total) by mouth 2 (two) times daily.   carvedilol 6.25 MG tablet Commonly known as:  COREG Take 1 tablet (6.25 mg total) by mouth 2 (two) times daily.   Melatonin 5 MG Tabs Take 5 mg by mouth as needed (sleep).   nitroGLYCERIN 0.4 MG SL tablet Commonly known as:  NITROSTAT Place 0.4 mg under the tongue every 5 (five) minutes x 3 doses as needed for chest pain.   polyethylene glycol powder powder Commonly known as:  GLYCOLAX/MIRALAX Take 1 Container by mouth daily.   potassium chloride SA 20 MEQ tablet Commonly known as:  KLOR-CON M20 Take 1 tablet (20 mEq total) by mouth 2 (two) times daily.   pravastatin 20 MG tablet Commonly known as:  PRAVACHOL TAKE 1 TABLET BY MOUTH  DAILY   tamsulosin 0.4 MG Caps capsule Commonly known as:  FLOMAX Take 0.4 mg by mouth daily.   torsemide 10 MG tablet Commonly known as:  DEMADEX Take 1 tablet (10 mg total) by mouth 2 (two) times daily. Start taking on:  October 10, 2018 What changed:    medication strength  how much to take  when to take this           Outstanding Labs/Studies   Patient will need BMET on 10/14/2018.  Duration of Discharge Encounter   Greater than 30 minutes including physician time.  Signed, Darreld Mclean, PA-C 10/09/2018, 4:33 PM

## 2018-10-09 NOTE — Progress Notes (Signed)
Patient is refusing all [facility] services at this time; stated " I don't need that." B Pennie Rushing [phone]

## 2018-10-12 ENCOUNTER — Other Ambulatory Visit: Payer: Medicare Other | Admitting: *Deleted

## 2018-10-12 DIAGNOSIS — N289 Disorder of kidney and ureter, unspecified: Secondary | ICD-10-CM

## 2018-10-12 LAB — BASIC METABOLIC PANEL WITH GFR
Anion gap: 10 (ref 5–15)
BUN: 26 mg/dL — ABNORMAL HIGH (ref 8–23)
CO2: 25 mmol/L (ref 22–32)
Calcium: 8.9 mg/dL (ref 8.9–10.3)
Chloride: 102 mmol/L (ref 98–111)
Creatinine, Ser: 1.19 mg/dL (ref 0.61–1.24)
GFR calc Af Amer: >60 mL/min
GFR calc non Af Amer: 55 mL/min — ABNORMAL LOW
Glucose, Bld: 116 mg/dL — ABNORMAL HIGH (ref 70–99)
Potassium: 4.3 mmol/L (ref 3.5–5.1)
Sodium: 137 mmol/L (ref 135–145)

## 2018-10-14 ENCOUNTER — Encounter: Payer: Self-pay | Admitting: Cardiology

## 2018-10-14 ENCOUNTER — Ambulatory Visit (INDEPENDENT_AMBULATORY_CARE_PROVIDER_SITE_OTHER): Payer: Medicare Other | Admitting: Cardiology

## 2018-10-14 ENCOUNTER — Telehealth: Payer: Self-pay

## 2018-10-14 VITALS — BP 120/62 | HR 73 | Ht 69.0 in | Wt 281.4 lb

## 2018-10-14 DIAGNOSIS — I10 Essential (primary) hypertension: Secondary | ICD-10-CM

## 2018-10-14 DIAGNOSIS — E785 Hyperlipidemia, unspecified: Secondary | ICD-10-CM | POA: Diagnosis not present

## 2018-10-14 DIAGNOSIS — Z950 Presence of cardiac pacemaker: Secondary | ICD-10-CM | POA: Diagnosis not present

## 2018-10-14 DIAGNOSIS — G4733 Obstructive sleep apnea (adult) (pediatric): Secondary | ICD-10-CM | POA: Diagnosis not present

## 2018-10-14 DIAGNOSIS — I480 Paroxysmal atrial fibrillation: Secondary | ICD-10-CM | POA: Diagnosis not present

## 2018-10-14 DIAGNOSIS — I5042 Chronic combined systolic (congestive) and diastolic (congestive) heart failure: Secondary | ICD-10-CM

## 2018-10-14 DIAGNOSIS — I255 Ischemic cardiomyopathy: Secondary | ICD-10-CM | POA: Diagnosis not present

## 2018-10-14 DIAGNOSIS — I2510 Atherosclerotic heart disease of native coronary artery without angina pectoris: Secondary | ICD-10-CM | POA: Diagnosis not present

## 2018-10-14 MED ORDER — TORSEMIDE 20 MG PO TABS: 20.0000 mg | ORAL_TABLET | Freq: Two times a day (BID) | ORAL | 3 refills | Status: DC

## 2018-10-14 NOTE — Patient Instructions (Addendum)
Medication Instructions:  1.) INCREASE: Torsemide to 20 mg twice a day  If you need a refill on your cardiac medications before your next appointment, please call your pharmacy.   Lab work: FUTURE: BMET in 1 week   If you have labs (blood work) drawn today and your tests are completely normal, you will receive your results only by: Marland Kitchen MyChart Message (if you have MyChart) OR . A paper copy in the mail If you have any lab test that is abnormal or we need to change your treatment, we will call you to review the results.  Testing/Procedures: None  Follow-Up: Follow up with Dr.Ross or someone on her care team in 1 month  Any Other Special Instructions Will Be Listed Below (If Applicable).   Heart Failure Action Plan A heart failure action plan helps you understand what to do when you have symptoms of heart failure. Follow the plan that was created by you and your health care provider. Review your plan each time you visit your health care provider. Red zone These signs and symptoms mean you should get medical help right away:  You have trouble breathing when resting.  You have a dry cough that is getting worse.  You have swelling or pain in your legs or abdomen that is getting worse.  You suddenly gain more than 2-3 lb (0.9-1.4 kg) in a day, or more than 5 lb (2.3 kg) in one week. This amount may be more or less depending on your condition.  You have trouble staying awake or you feel confused.  You have chest pain.  You do not have an appetite.  You pass out. If you experience any of these symptoms:  Call your local emergency services (911 in the [place] right away or seek help at the emergency department of the nearest hospital. Yellow zone These signs and symptoms mean your condition may be getting worse and you should make some changes:  You have trouble breathing when you are active or you need to sleep with extra pillows.  You have swelling in your legs or  abdomen.  You gain 2-3 lb (0.9-1.4 kg) in one day, or 5 lb (2.3 kg) in one week. This amount may be more or less depending on your condition.  You get tired easily.  You have trouble sleeping.  You have a dry cough. If you experience any of these symptoms:  Contact your health care provider within the next day.  Your health care provider may adjust your medicines. Green zone These signs mean you are doing well and can continue what you are doing:  You do not have shortness of breath.  You have very little swelling or no new swelling.  Your weight is stable (no gain or loss).  You have a normal activity level.  You do not have chest pain or any other new symptoms. Follow these instructions at home:  Take over-the-counter and prescription medicines only as told by your health care provider.  Weigh yourself daily. Your target weight is __________ lb (__________ kg). ? Call your health care provider if you gain more than __________ lb (__________ kg) in a day, or more than __________ lb (__________ kg) in one week.  Eat a heart-healthy diet. Work with a diet and nutrition specialist (dietitian) to create an eating plan that is best for you.  Keep all follow-up visits as told by your health care provider. This is important. Where to find more information  American Heart Association: www.heart.org  Summary  Follow the action plan that was created by you and your health care provider.  Get help right away if you have any symptoms in the Red zone. This information is not intended to replace advice given to you by your health care provider. Make sure you discuss any questions you have with your health care provider. Document Released: 09/28/2016 Document Revised: 04/23/2017 Document Reviewed: 09/28/2016 Elsevier Interactive Patient Education  2019 [employer].    Daily Weight Record It is important to weigh yourself daily. To do this:  Make sure you use a reliable scale.  Use the same scale each day.  Keep this daily weight chart near your scale.  Weigh yourself each morning at the same time.  Before weighing yourself: ? Take off your shoes. ? Make sure you are wearing the same amount of clothing each day.  Write down your weight in the spaces on the form.  Compare today's weight to yesterday's weight.  Bring this form with you to your follow-up visits with your health care provider. Call your health care provider if you have concerns about your weight, including rapid weight gain or loss.  Date: ________ Weight: ____________________ Date: ________   Date: ________ Weight: ____________________ Date: ________  Date: ________ Weight: ____________________ Date: ________ Weight:   Date: ________ Weight: ____________________ Date: ________ Weight: ____________________ Date: ________ Weight: ____________________ Date: ________ Weight: ____________________ Date: ________ Weight: ____________________ Date: ________ Weight: ____________________ Date: ________ Weight: ____________________ Date: ________ Weight: ____________________ Date: ________ Weight: ____________________ Date: ________ Weight: ____________________ Date: ________ Weight: ____________________ Date: ________ Weight: ____________________ Date: ________ Weight: ____________________ Date: ________ Weight: ____________________ Date: ________ Weight: ____________________ Date: ________ Weight: ____________________ Date: ________ Weight: ____________________ Date: ________ Weight: ____________________ Date: ________ Weight: ____________________ Date: ________ Weight: ____________________ Date: ________ Weight: ____________________ Date: ________ Weight: ____________________ Date: ________ Weight: ____________________ Date: ________ Weight: ____________________ Date: ________ Weight: ____________________ Date: ________ Weight: ____________________ Date: ________ Weight: ____________________ Date:  ________ Weight: ____________________ Date: ________ Weight: ____________________ Date: ________ Weight: ____________________ Date: ________ Weight: ____________________ Date: ________ Weight: ____________________ Date: ________ Weight: ____________________ Date: ________ Weight: ____________________ Date: ________ Weight: ____________________ Date: ________ Weight: ____________________ Date: ________ Weight: ____________________ Date: ________ Weight: ____________________ Date: ________ Weight: ____________________ Date: ________ Weight: ____________________ Date: ________ Weight: ____________________ Date: ________ Weight: ____________________ Date: ________ Weight: ____________________ Date: ________ Weight:  ________ Date: ________ Weight: ____________________ Date: ________ Weight: ____________________ Date: ________ Weight: ____________________ Date: ________ Weight: ____________________ Date: ________ Weight: ____________________ Date: ________ Weight: ____________________ Date: ________ Weight: ____________________ Date: ________ Weight: ____________________ Date: ________ Weight: ____________________ Date: ________ Weight: ____________________ Date: ________ Weight: ____________________ Date: ________ Weight: ____________________ Date: ________ Weight: ____________________ Date: ________ Weight: ____________________ Date: ________ Weight: ____________________ Date: ________ Weight: ____________________ Date: ________ Weight: ____________________ Date: ________ Weight: ____________________ Date: ________ Weight: ____________________ Date: ________ Weight: ____________________ Date: ________ Weight: ____________________ Date: ________ Weight: ____________________ Date: ________ Weight: ____________________ Date: ________ Weight: ____________________ Date: ________ Weight: ____________________ Date: ________ Weight: ____________________ Date: ________ Weight: ____________________ Date:  ________ Weight: ____________________ Date: ________ Weight: ____________________ Date: ________ Weight: ____________________ Date: ________ Weight: ____________________ Date: ________ Weight: ____________________ Date: ________ Weight: ____________________ Date: ________ Weight: ____________________ Date: ________ Weight: ____________________ Date: ________ Weight: ____________________ Date: ________ Weight: ____________________ Date: ________ Weight: ____________________ Date: ________ Weight: ____________________ Date: ________ Weight: ____________________ Date: ________ Weight: ____________________ Date: ________ Weight: ____________________ Date: ________ Weight: ____________________ Date: ________ Weight: ____________________ Date: ________ Weight: ____________________ Date: ________ Weight: ____________________ Date: ________ Weight: ____________________ Date: ________ Weight: ____________________ Date: ________ Weight: ____________________ Date: ________  Weight: ____________________ This information is not intended to replace advice given to you by your health care provider. Make sure you discuss any questions you have with your health care provider. Document Released: 10/31/2006 Document Revised: 08/18/2017 Document Reviewed: 08/18/2017 Elsevier Interactive Patient Education  2019 [employer].

## 2018-10-14 NOTE — Telephone Encounter (Signed)
Orders

## 2018-10-14 NOTE — Progress Notes (Signed)
Cardiology Office Note:    Date:  10/14/2018   ID:  Levi [name], DOB [birth year], MRN [id number]  PCP:  Bernerd Limbo, MD  Cardiologist:  [name], MD  Referring MD: Bernerd Limbo, MD   Chief Complaint  Patient presents with  . Hospitalization Follow-up    CHF    History of Present Illness:    Levi [name] is a 85 y.o. male with a past medical history significant for CAD s/p CABG in 2011 and DES to the LAD in 2013, paroxysmal atrial fibrillation on Eliquis, symptomatic bradycardia Mobitz 2 s/p pacemaker placement in 4431, chronic diastolic CHF, hypertension, and hyperlipidemia. He went to [facility] for a second opinion 2018 and had a cardiac catheterization demonstrating patent LIMA to the LAD, 80% mid in-stent restenosis of the LAD, 50 to 60% mid circumflex, 70% ostial OM 2 stenosis, 60% proximal RCA and 60% distal RCA. All vein grafts were previously known to be occluded. No targets for PCI medical therapy recommended. Most recent echo 09/11/2018 LVEF now 40 to 45% with inferior wall motion abnormality and moderate diastolic dysfunction.  [name] was hospitalized 10/06/2018-10/09/2018 for acute on chronic combined systolic and diastolic CHF.  He was sent to the hospital from an office visit when he was noted to have a 13 pound weight gain and increased shortness of breath.  Apparently his Demadex had been decreased due to increased creatinine.  Chest x-ray showed mild interstitial coarsening presumably was 203.  Was diuresed with IV Lasix with good response.  Weight decreased from 294 pounds to 273 pounds.  Serum creatinine increased to 1.42 with diuresis.  His symptoms improved.  He was started on torsemide 10 mg. Coreg was continued. ACEi/ARB was not started due to renal function.  Follow-up labs on 10/12/2018 showed improvement in renal function with serum creatinine 1.19.  TSH was elevated in the hospital at 7.8-8.  Free T4 and free T3 were both normal.  Further evaluation  deferred to PCP.  The patient is here today for hospital follow-up with his girlfriend and says that he feels "pretty doggone good". His wt was initially stable for 3-4 days but has crept up since then. He has DOE but is at his baseline that has been for the last year. He has trace LE edema. He has slept in a recliner for the last 2 years, no increase in dyspnea at night. He uses CPAP regularly at night and with naps too. No chest pain/pressure.   Past Medical History:  Diagnosis Date  . Asthma   . Benign neoplasm of colon   . CAD (coronary artery disease) Dec 2011   s/p CABG per Dr. Roxy Manns; had normal EF; All SVGs occluded per follow up cath with only LIMA to LAD patent; s/p PCI of the proximal and mid LAD February 2014 per Dr. Burt Knack  . CHF (congestive heart failure) ([place])   . Dyspnea   . Dyspnea on exertion   . Edema   . Emphysema    "never treated for it" (10/05/2015)  . History of hiatal hernia   . HLD (hyperlipidemia)   . HTN (hypertension)   . Malaise and fatigue   . Obesity   . Osteopenia   . Pneumonia 1960s X 1; 1990s X 1  . Presence of permanent cardiac pacemaker   . Prostate cancer ([place])   . Skin cancer    "face, nose, top of ears, scalp"  . Symptomatic bradycardia    STJ PPM, 10/05/15, Dr. Lovena Le  Past Surgical History:  Procedure Laterality Date  . ANTERIOR CERVICAL DECOMP/DISCECTOMY FUSION    . BACK SURGERY    . C-EYE SURGERY PROCEDURE    . CATARACT EXTRACTION Right   . CORONARY ANGIOPLASTY WITH STENT PLACEMENT  10/14/12   with stent to proximal and mid LAD  . CORONARY ARTERY BYPASS GRAFT  08/23/10   "CABG X4"  . EP IMPLANTABLE DEVICE N/A 10/05/2015   Procedure: Pacemaker Implant;  Surgeon: [name], MD;  Location: [facility];  Service: Cardiovascular;  Laterality: N/A;  . EP IMPLANTABLE DEVICE N/A 10/06/2015   Procedure: Lead Revision/Repair;  Surgeon: [name], MD;  Location: [facility];  Service: Cardiovascular;  Laterality: N/A;    . EYE SURGERY    . INSERT / REPLACE / REMOVE PACEMAKER  10/05/2015  . LEFT HEART CATHETERIZATION WITH CORONARY/GRAFT ANGIOGRAM N/A 09/01/2012   Procedure: LEFT HEART CATHETERIZATION WITH [name];  Surgeon: [name], MD;  Location: [facility] CATH LAB;  Service: Cardiovascular;  Laterality: N/A;  . MEDIAN STERNOTOMY  08/23/10  . MOHS SURGERY  "@ least twice"  . PACEMAKER INSERTION     STJ 10/05/15  . PERCUTANEOUS CORONARY STENT INTERVENTION (PCI-S) N/A 10/14/2012   Procedure: PERCUTANEOUS CORONARY STENT INTERVENTION (PCI-S);  Surgeon: [name], MD;  Location: [facility] CATH LAB;  Service: Cardiovascular;  Laterality: N/A;  . PROSTATE BIOPSY    . TRANSURETHRAL RESECTION OF PROSTATE      Current Medications: Current Meds  Medication Sig  . apixaban (ELIQUIS) 5 MG TABS tablet Take 1 tablet (5 mg total) by mouth 2 (two) times daily.  . carvedilol (COREG) 6.25 MG tablet Take 1 tablet (6.25 mg total) by mouth 2 (two) times daily.  . Melatonin 5 MG TABS Take 5 mg by mouth as needed (sleep).   . nitroGLYCERIN (NITROSTAT) 0.4 MG SL tablet Place 0.4 mg under the tongue every 5 (five) minutes x 3 doses as needed for chest pain.  . polyethylene glycol powder (GLYCOLAX/MIRALAX) powder Take 1 Container by mouth daily.   . potassium chloride SA (KLOR-CON M20) 20 MEQ tablet Take 1 tablet (20 mEq total) by mouth 2 (two) times daily.  . pravastatin (PRAVACHOL) 20 MG tablet Take 20 mg by mouth daily.  . tamsulosin (FLOMAX) 0.4 MG CAPS capsule Take 0.4 mg by mouth daily.  Marland Kitchen torsemide (DEMADEX) 20 MG tablet Take 1 tablet (20 mg total) by mouth 2 (two) times daily.  . [DISCONTINUED] torsemide (DEMADEX) 10 MG tablet Take 1 tablet (10 mg total) by mouth 2 (two) times daily.     Allergies:   Patient has no known allergies.   Social History   Socioeconomic History  . Marital status: Widowed    Spouse name: Not on file  . Number of children: Not on file  . Years of education: Not on file  .  Highest education level: Not on file  Occupational History  . Occupation: retired Information systems manager: RETIRED  Social Needs  . Financial resource strain: Not on file  . Food insecurity:    Worry: Not on file    Inability: Not on file  . Transportation needs:    Medical: Not on file    Non-medical: Not on file  Tobacco Use  . Smoking status: Former Smoker    Packs/day: 1.00    Years: 30.00    Pack years: 30.00    Types: Cigarettes    Last attempt to quit: 09/07/1975  Years since quitting: 43.1  . Smokeless tobacco: Former Systems developer    Types: [place] date: 07/27/2015  Substance and Sexual Activity  . Alcohol use: No    Alcohol/week: 0.0 standard drinks  . Drug use: No  . Sexual activity: Not Currently  Lifestyle  . Physical activity:    Days per week: Not on file    Minutes per session: Not on file  . Stress: Not on file  Relationships  . Social connections:    Talks on phone: Not on file    Gets together: Not on file    Attends religious service: Not on file    Active member of club or organization: Not on file    Attends meetings of clubs or organizations: Not on file    Relationship status: Not on file  Other Topics Concern  . Not on file  Social History Narrative  . Not on file     Family History: The patient's family history includes Heart attack in his father; Heart disease in his brother; Hypertension in his mother; Kidney cancer in his father; Prostate cancer in his brother; Skin cancer in his brother; Stroke in his mother. There is no history of Allergic rhinitis, Asthma, Eczema, or Urticaria. ROS:   Please see the history of present illness.     All other systems reviewed and are negative.  EKGs/Labs/Other Studies Reviewed:    The following studies were reviewed today:  Echocardiogram 09/11/2018 Study Conclusions - Left ventricle: The cavity size was normal. There was mild   concentric hypertrophy. Systolic function was mildly to   moderately  reduced. The estimated ejection fraction was in the   range of 40% to 45%. Hypokinesis of the mid-apicalinferior,   inferoseptal, and apical myocardium. Features are consistent with   a pseudonormal left ventricular filling pattern, with concomitant   abnormal relaxation and increased filling pressure (grade 2   diastolic dysfunction). Doppler parameters are consistent with   indeterminate ventricular filling pressure. - Aortic valve: Transvalvular velocity was within the normal range.   There was no stenosis. There was no regurgitation. - Mitral valve: Calcified annulus. Transvalvular velocity was   within the normal range. There was no evidence for stenosis.   There was no regurgitation. - Right ventricle: The cavity size was normal. Wall thickness was   normal. Systolic function was normal. - Right atrium: The atrium was moderately dilated. - Atrial septum: The septum bowed from right to left, consistent   with increased right atrial pressure. - Tricuspid valve: There was mild regurgitation. - Pulmonary arteries: Systolic pressure could not be accurately   estimated.  EKG:  EKG is not ordered today.   Recent Labs: 09/11/2018: NT-Pro BNP 2,599 10/06/2018: ALT 14; B Natriuretic Peptide 203.0; Hemoglobin 12.5; Magnesium 2.4; Platelets 118; TSH 7.828 10/12/2018: BUN 26; Creatinine, Ser 1.19; Potassium 4.3; Sodium 137   Recent Lipid Panel    Component Value Date/Time   CHOL 113 06/01/2014 0930   TRIG 115.0 06/01/2014 0930   HDL 32.40 (L) 06/01/2014 0930   CHOLHDL 3 06/01/2014 0930   VLDL 23.0 06/01/2014 0930   LDLCALC 58 06/01/2014 0930   LDLDIRECT 70.7 06/10/2013 1344    Physical Exam:    VS:  BP 120/62   Pulse 73   Ht 5\' 9"  (1.753 m)   Wt 281 lb 6.4 oz (127.6 kg)   SpO2 93%   BMI 41.56 kg/m     Wt Readings from Last 3 Encounters:  10/14/18  281 lb 6.4 oz (127.6 kg)  10/09/18 273 lb 4.8 oz (124 kg)  10/06/18 294 lb 12.8 oz (133.7 kg)     Physical Exam    Constitutional: He is oriented to person, place, and time. He appears well-developed and well-nourished.  Central obesity  HENT:  Head: Normocephalic and atraumatic.  Neck: Normal range of motion. Neck supple. No JVD present.  Cardiovascular: Normal rate, regular rhythm, normal heart sounds and intact distal pulses. Exam reveals no gallop and no friction rub.  No murmur heard. Pulmonary/Chest: Effort normal and breath sounds normal. No respiratory distress. He has no wheezes. He has no rales.  Abdominal: Soft. Bowel sounds are normal.  Musculoskeletal:        General: Edema present.     Comments: Trace pretibial/ankle edema  Neurological: He is alert and oriented to person, place, and time.  Skin: Skin is warm and dry.  Psychiatric: He has a normal mood and affect. His behavior is normal. Judgment and thought content normal.  Vitals reviewed.   ASSESSMENT:    1. Chronic combined systolic and diastolic CHF (congestive heart failure) ([place])   2. Chronic coronary artery disease   3. PAF (paroxysmal atrial fibrillation) ([place])   4. Presence of permanent cardiac pacemaker   5. Essential hypertension   6. Hyperlipidemia, unspecified hyperlipidemia type   7. OSA (obstructive sleep apnea)    PLAN:    In order of problems listed above:  Acute on chronic combined CHF/ischemic cardiomyopathy -Most recent echo showed LVEF 40-45% with hypokinesis of the mid-apicalinferior, inferoseptal, and apical myocardium as well as grade 2 diastolic dysfunction. -Patient admitted to the hospital on 10/06/2018 with weight gain and shortness of breath.  Treated with IV diuresis and improvement in symptoms. -Medical management with carvedilol.  Discharged from the hospital on torsemide 10 mg twice daily.  Not on ACEI/ARB due to renal function.  -Renal function improved on labs from 2/10. K+ 4.3. Pt symptoms are pretty much at baseline but wt is creeping up (273>281 lbs). Will increase his torsemide to 20 mg BID  (has been up to 40 mg BID in the past). Will recheck BMP in a week to watch renal function and K+.  -Once diuretic dosing stabilized and if renal function OK could consider adding low dose losartan.  -Pt tries to really limit his salt intake but does eat out often.  -Advised on limiting fluid intake to 6-8 cups/day.  He thinks he has had 10 cups today already.  We discussed ways to handle dry mouth. -Heart failure zones handout is provided along with a daily weight log.  We had extensive discussion on monitoring for heart failure and reporting symptoms to the office so that we can catch it early and not end up in the hospital. -He would prefer closer follow up so will see back in 2 months.   CAD S/P CABG -Patient continues on aspirin, beta-blocker and statin. -No anginal symptoms.   Atrial fibrillation -Most recent device interrogation indicates persistent A. fib since June 2019.  On a beta-blocker for rate control. -Patient is on Eliquis for stroke risk reduction. No unusual bleeding.  -Regular rhythm today.   History of symptomatic bradycardia, Mobitz 2 s/p PPM -Followed by Dr. Lovena Le  Hypertension -On Coreg, torsemide. BP well controlled.  Hyperlipidemia -On pravastatin 20 mg daily.  OSA -Uses CPAP at hs and with naps. He feels like he benefits from therapy.    Medication Adjustments/Labs and Tests Ordered: Current medicines are reviewed at  length with the patient today.  Concerns regarding medicines are outlined above. Labs and tests ordered and medication changes are outlined in the patient instructions below:  Patient Instructions  Medication Instructions:  1.) INCREASE: Torsemide to 20 mg twice a day  If you need a refill on your cardiac medications before your next appointment, please call your pharmacy.   Lab work: FUTURE: BMET in 1 week   If you have labs (blood work) drawn today and your tests are completely normal, you will receive your results only by: Marland Kitchen MyChart  Message (if you have MyChart) OR . A paper copy in the mail If you have any lab test that is abnormal or we need to change your treatment, we will call you to review the results.  Testing/Procedures: None  Follow-Up: Follow up with Dr.Ross or someone on her care team in 1 month  Any Other Special Instructions Will Be Listed Below (If Applicable).   Heart Failure Action Plan A heart failure action plan helps you understand what to do when you have symptoms of heart failure. Follow the plan that was created by you and your health care provider. Review your plan each time you visit your health care provider. Red zone These signs and symptoms mean you should get medical help right away:  You have trouble breathing when resting.  You have a dry cough that is getting worse.  You have swelling or pain in your legs or abdomen that is getting worse.  You suddenly gain more than 2-3 lb (0.9-1.4 kg) in a day, or more than 5 lb (2.3 kg) in one week. This amount may be more or less depending on your condition.  You have trouble staying awake or you feel confused.  You have chest pain.  You do not have an appetite.  You pass out. If you experience any of these symptoms:  Call your local emergency services (911 in the [place] right away or seek help at the emergency department of the nearest hospital. Yellow zone These signs and symptoms mean your condition may be getting worse and you should make some changes:  You have trouble breathing when you are active or you need to sleep with extra pillows.  You have swelling in your legs or abdomen.  You gain 2-3 lb (0.9-1.4 kg) in one day, or 5 lb (2.3 kg) in one week. This amount may be more or less depending on your condition.  You get tired easily.  You have trouble sleeping.  You have a dry cough. If you experience any of these symptoms:  Contact your health care provider within the next day.  Your health care provider may adjust  your medicines. Green zone These signs mean you are doing well and can continue what you are doing:  You do not have shortness of breath.  You have very little swelling or no new swelling.  Your weight is stable (no gain or loss).  You have a normal activity level.  You do not have chest pain or any other new symptoms. Follow these instructions at home:  Take over-the-counter and prescription medicines only as told by your health care provider.  Weigh yourself daily. Your target weight is __________ lb (__________ kg). ? Call your health care provider if you gain more than __________ lb (__________ kg) in a day, or more than __________ lb (__________ kg) in one week.  Eat a heart-healthy diet. Work with a diet and nutrition specialist (dietitian) to create an eating  plan that is best for you.  Keep all follow-up visits as told by your health care provider. This is important. Where to find more information  American Heart Association: www.heart.org Summary  Follow the action plan that was created by you and your health care provider.  Get help right away if you have any symptoms in the Red zone. This information is not intended to replace advice given to you by your health care provider. Make sure you discuss any questions you have with your health care provider. Document Released: 09/28/2016 Document Revised: 04/23/2017 Document Reviewed: 09/28/2016 Elsevier Interactive Patient Education  2019 [employer].    Daily Weight Record It is important to weigh yourself daily. To do this:  Make sure you use a reliable scale. Use the same scale each day.  Keep this daily weight chart near your scale.  Weigh yourself each morning at the same time.  Before weighing yourself: ? Take off your shoes. ? Make sure you are wearing the same amount of clothing each day.  Write down your weight in the spaces on the form.  Compare today's weight to yesterday's weight.  Bring this  form with you to your follow-up visits with your health care provider. Call your health care provider if you have concerns about your weight, including rapid weight gain or loss.  Date: ________ Weight: ____________________ Date: ________   Date: ________ Weight: ____________________ Date: ________  Date: ________ Weight: ____________________ Date: ________ Weight:   Date: ________ Weight: ____________________ Date: ________ Weight: ____________________ Date: ________ Weight: ____________________ Date: ________ Weight: ____________________ Date: ________ Weight: ____________________ Date: ________ Weight: ____________________ Date: ________ Weight: ____________________ Date: ________ Weight: ____________________ Date: ________ Weight: ____________________ Date: ________ Weight: ____________________ Date: ________ Weight: ____________________ Date: ________ Weight: ____________________ Date: ________ Weight: ____________________ Date: ________ Weight: ____________________ Date: ________ Weight: ____________________ Date: ________ Weight: ____________________ Date: ________ Weight: ____________________ Date: ________ Weight: ____________________ Date: ________ Weight: ____________________ Date: ________ Weight: ____________________ Date: ________ Weight: ____________________ Date: ________ Weight: ____________________ Date: ________ Weight: ____________________ Date: ________ Weight: ____________________ Date: ________ Weight: ____________________ Date: ________ Weight: ____________________ Date: ________ Weight: ____________________ Date: ________ Weight: ____________________ Date: ________ Weight: ____________________ Date: ________ Weight: ____________________ Date: ________ Weight: ____________________ Date: ________ Weight: ____________________ Date: ________ Weight: ____________________ Date: ________ Weight: ____________________ Date: ________ Weight: ____________________ Date: ________  Weight: ____________________ Date: ________ Weight: ____________________ Date: ________ Weight: ____________________ Date: ________ Weight: ____________________ Date: ________ Weight: ____________________ Date: ________ Weight: ____________________ Date: ________ Weight: ____________________ Date: ________ Weight: ____________________ Date: ________ Weight:  ________ Date: ________ Weight: ____________________ Date: ________ Weight: ____________________ Date: ________ Weight: ____________________ Date: ________ Weight: ____________________ Date: ________ Weight: ____________________ Date: ________ Weight: ____________________ Date: ________ Weight: ____________________ Date: ________ Weight: ____________________ Date: ________ Weight: ____________________ Date: ________ Weight: ____________________ Date: ________ Weight: ____________________ Date: ________ Weight: ____________________ Date: ________ Weight: ____________________ Date: ________ Weight: ____________________ Date: ________ Weight: ____________________ Date: ________ Weight: ____________________ Date: ________ Weight: ____________________ Date: ________ Weight: ____________________ Date: ________ Weight: ____________________ Date: ________ Weight: ____________________ Date: ________ Weight: ____________________ Date: ________ Weight: ____________________ Date: ________ Weight: ____________________ Date: ________ Weight: ____________________ Date: ________ Weight: ____________________ Date: ________ Weight: ____________________ Date: ________ Weight: ____________________ Date: ________ Weight: ____________________ Date: ________ Weight: ____________________ Date: ________ Weight: ____________________ Date: ________ Weight: ____________________ Date: ________ Weight: ____________________ Date: ________ Weight: ____________________ Date: ________ Weight: ____________________ Date: ________ Weight: ____________________ Date: ________  Weight: ____________________ Date: ________ Weight: ____________________ Date: ________ Weight: ____________________ Date: ________ Weight: ____________________ Date: ________ Weight: ____________________ Date: ________ Weight: ____________________ Date: ________ Weight:  ____________________ Date: ________ Weight: ____________________ Date: ________ Weight: ____________________ Date: ________ Weight: ____________________ Date: ________ Weight: ____________________ Date: ________ Weight: ____________________ Date: ________ Weight: ____________________ Date: ________ Weight: ____________________ Date: ________ Weight: ____________________ This information is not intended to replace advice given to you by your health care provider. Make sure you discuss any questions you have with your health care provider. Document Released: 10/31/2006 Document Revised: 08/18/2017 Document Reviewed: 08/18/2017 Elsevier Interactive Patient Education  2019 [place], Daune Perch, NP  10/14/2018 11:12 AM    [place]

## 2018-10-19 ENCOUNTER — Ambulatory Visit: Payer: Medicare Other | Admitting: Cardiology

## 2018-10-21 ENCOUNTER — Other Ambulatory Visit: Payer: Medicare Other | Admitting: *Deleted

## 2018-10-21 DIAGNOSIS — I10 Essential (primary) hypertension: Secondary | ICD-10-CM | POA: Diagnosis not present

## 2018-10-21 LAB — BASIC METABOLIC PANEL
BUN/Creatinine Ratio: 20 (ref 10–24)
BUN: 27 mg/dL (ref 8–27)
CHLORIDE: 96 mmol/L (ref 96–106)
CO2: 24 mmol/L (ref 20–29)
Calcium: 9.5 mg/dL (ref 8.6–10.2)
Creatinine, Ser: 1.34 mg/dL — ABNORMAL HIGH (ref 0.76–1.27)
GFR, EST AFRICAN AMERICAN: 55 mL/min/{1.73_m2} — AB (ref >59)
GFR, EST NON AFRICAN AMERICAN: 47 mL/min/{1.73_m2} — AB (ref >59)
Glucose: 89 mg/dL (ref 65–99)
POTASSIUM: 4.6 mmol/L (ref 3.5–5.2)
Sodium: 136 mmol/L (ref 134–144)

## 2018-10-27 ENCOUNTER — Ambulatory Visit: Payer: Medicare Other | Admitting: Physician Assistant

## 2018-11-05 ENCOUNTER — Telehealth: Payer: Self-pay | Admitting: Internal Medicine

## 2018-11-05 ENCOUNTER — Encounter: Payer: Medicare Other | Admitting: Internal Medicine

## 2018-11-05 NOTE — Telephone Encounter (Signed)
Spoke with pt's daughter [name] per [place]. She was unsure if her father was taking furosemide or torsemide because pt had both bottles. I informed her that at pt's last OV with Pecolia Ades, NP we increased his torsemide to 20 mg twice a day and that is what he should be currently taking. I informed her that we do not have furosemide on pt's current med list. Pt is scheduled to see Richardson Dopp PA-C on 11/11/2018 , I told her to make sure he brings a list of his medications with him or his medications.

## 2018-11-05 NOTE — Telephone Encounter (Signed)
New message:    Patient calling concering her fathers medication that he is unsure of. Please  Call patient daughter.

## 2018-11-11 ENCOUNTER — Encounter: Payer: Self-pay | Admitting: Physician Assistant

## 2018-11-11 ENCOUNTER — Ambulatory Visit
Admission: RE | Admit: 2018-11-11 | Discharge: 2018-11-11 | Disposition: A | Discharge status: Home / Self Care | Payer: Medicare Other | Source: Ambulatory Visit | Attending: Physician Assistant | Admitting: Physician Assistant

## 2018-11-11 ENCOUNTER — Other Ambulatory Visit: Payer: Self-pay

## 2018-11-11 ENCOUNTER — Ambulatory Visit (INDEPENDENT_AMBULATORY_CARE_PROVIDER_SITE_OTHER): Payer: Medicare Other | Admitting: Physician Assistant

## 2018-11-11 VITALS — BP 112/60 | HR 72 | Ht 69.0 in | Wt 280.4 lb

## 2018-11-11 DIAGNOSIS — I255 Ischemic cardiomyopathy: Secondary | ICD-10-CM

## 2018-11-11 DIAGNOSIS — R05 Cough: Secondary | ICD-10-CM

## 2018-11-11 DIAGNOSIS — I10 Essential (primary) hypertension: Secondary | ICD-10-CM

## 2018-11-11 DIAGNOSIS — N182 Chronic kidney disease, stage 2 (mild): Secondary | ICD-10-CM

## 2018-11-11 DIAGNOSIS — R059 Cough, unspecified: Secondary | ICD-10-CM

## 2018-11-11 DIAGNOSIS — I25119 Atherosclerotic heart disease of native coronary artery with unspecified angina pectoris: Secondary | ICD-10-CM

## 2018-11-11 DIAGNOSIS — R0602 Shortness of breath: Secondary | ICD-10-CM | POA: Diagnosis not present

## 2018-11-11 DIAGNOSIS — I5042 Chronic combined systolic (congestive) and diastolic (congestive) heart failure: Secondary | ICD-10-CM | POA: Diagnosis not present

## 2018-11-11 DIAGNOSIS — I480 Paroxysmal atrial fibrillation: Secondary | ICD-10-CM

## 2018-11-11 MED ORDER — DOXYCYCLINE HYCLATE 100 MG PO TABS: 100.0000 mg | ORAL_TABLET | Freq: Two times a day (BID) | ORAL | 0 refills | Status: AC

## 2018-11-11 MED ORDER — TORSEMIDE 20 MG PO TABS: 20.0000 mg | ORAL_TABLET | Freq: Two times a day (BID) | ORAL | 3 refills | Status: DC

## 2018-11-11 MED ORDER — DOXYCYCLINE HYCLATE 100 MG PO TABS: 100.0000 mg | ORAL_TABLET | Freq: Two times a day (BID) | ORAL | 0 refills | Status: DC

## 2018-11-11 NOTE — Patient Instructions (Signed)
Medication Instructions:   Take Doxycycline 100 mg twice daily for 7 days.  If you need a refill on your cardiac medications before your next appointment, please call your pharmacy.   Lab work:  Today - CBC with differential, BMET  If you have labs (blood work) drawn today and your tests are completely normal, you will receive your results only by: Marland Kitchen MyChart Message (if you have MyChart) OR . A paper copy in the mail If you have any lab test that is abnormal or we need to change your treatment, we will call you to review the results.  Testing/Procedures:  Chest X ray today at [place]: At [facility], you and your health needs are our priority.  As part of our continuing mission to provide you with exceptional heart care, we have created designated Provider Care Teams.  These Care Teams include your primary Cardiologist (physician) and Advanced Practice Providers (APPs -  Physician Assistants and Nurse Practitioners) who all work together to provide you with the care you need, when you need it. You will need a follow up appointment in:  3 months.  Please call our office 2 months in advance to schedule this appointment.  You may see [name], MD or Richardson Dopp, PA-C   Any Other Special Instructions Will Be Listed Below (If Applicable). Medicines you can try for sinus drainage: 1. Claritin 10 mg once daily  2. If Claritin does not help, try Benadryl 25 mg at bedtime.  This may make you drowsy. 3. Mucinex - plain Mucinex or Mucinex-DM;  DO NOT use Mucinex-D  Call your primary care doctor for follow up on your cough in the next 1-2 weeks.

## 2018-11-11 NOTE — Progress Notes (Signed)
Cardiology Office Note:    Date:  11/11/2018   ID:  Levi [name], DOB 1929-08-18, MRN [id number]  PCP:  Bernerd Limbo, MD  Cardiologist:  [name], MD   Electrophysiologist:  Cristopher Peru, MD   Referring MD: Bernerd Limbo, MD   Chief Complaint  Patient presents with   Follow-up    CHF   Cough     History of Present Illness:    Levi [name] is a 85 y.o. male with coronary artery disease status post CABG in 2011 and drug-eluting stent x2 to the LAD in 2013, paroxysmal atrial fibrillation, symptomatic bradycardia with Mobitz 2 status post pacemaker implantation in January 3354, combined systolic and diastolic heart failure, hypertension, hyperlipidemia.He was seen at [facility] in 2018 for a second opinion.Cardiac Catheterizationin June 2018 demonstrated patent LIMA-LAD, 80% mid in-stent restenosis in LAD, 50 to 60% mid LCx stenosis, 70% ostial OM2 stenosis, 60% proximal RCA and 60% distal RCA stenoses. All vein grafts were previously known to be occluded. There were no targets for PCI and medical therapy was recommended.  He is on Apixaban for anticoagulation.  EF was previously normal.  Echocardiogram in December 2019 demonstrated EF 40-45%.  This was reviewed with Dr. Harrington Challenger and continued medical therapy was recommended.    He was admitted in February 2020 with decompensated heart failure.  Hospitalization was complicated by acute kidney injury.  With diuresis, he was -5.3 L and lost 14 pounds (discharge weight 273.3 pounds).  Clinic 10/14/2018.  His weight was starting to increase in his torsemide dose was adjusted.  Of note, he was not on ACE inhibitor/ARB due to worsening renal function in the hospital.  It was felt that this could be resumed once his diuretic dose and renal function stabilized.  [name] returns for follow up.  He is here with his son.  He brings in a detailed list of his weights since his last appointment.  His weight at home has come down to 273  pounds, which was his discharge weight.  He has chronic shortness of breath without significant change.  He denies orthopnea or PND.  He has not had chest pain, syncope.  He does have some lower extremity swelling which is overall stable.  He has had a cough for about 4 weeks.  He denies any fevers.  His sputum is clear to white.  He feels like he has increased sinus drainage at night.  His cough worsens at night.  He has not had any increase in his shortness of breath.  CHADS2-VASc=5 (age x 2, CHF, CAD, HTN).  Prior CV studies:   The following studies were reviewed today:  Echo 08/28/18 Mild conc LVH, EF 40-45, inf/inf-sept/apical HK, Gr 2 DD, MAC, normal RVSF, mod RAE, increased RA pressure, mild TR  Cardiac catheterization 02/10/2017 ([facility]) Coronary angiography reveals 3-vessel CAD.  LM is diffusely diseased with 50% distal stenosis.  LAD has a 80% mid instent restenosis,  LCX has serial 50-60% stenoses in the mid segment, OM2 is a small vessel and has a 70% ostial stenosis.  RCA has long diffuse 50-60% proximal and 60% distal stenosis.  LIMA to LAD is patent with a kink in the mid segment. Flow is normal.  LV-Gram reveals EF 50%. LVEDP is 8 mmHg.  SVG grafts are known to be chronically occluded, so not injected   Nuclear stress test 01/21/2017 ([facility]) 1. Moderate to severe inferoapical inducible ischemia with regadenoson 2. Mild  to moderate mid inferior and posterior basal defect. Cannot rule out prior infarct. 3. Mild to moderate hypokinesis of the inferior wall. Dyskinesia/akinesia of the apex. 4. Moderately decreased left ventricular ejection fraction at 43%. 5. Please correlate with a cardiac ultrasound  Echo 06/20/2016 EF 02-58, grade 1 diastolic dysfunction, MAC, moderate LAE  Echo 09/28/2015 Mild concentric LVH, EF 55-60, normal wall motion, MAC, mild MR, severe LAE, normal RVSF, mild RAE, mild TR, PASP 41,  GLS -14.8%  Exercise tolerance test 09/27/2015 Poor exercise capacity.  Test stopped early as HR 58 >> 33 and patient was weak. ECGs demonstrate transient complete heart block.  Dr. Cristopher Peru saw patient and discussed proceeding with pacemaker implantation. Echo will be arranged.  Carotid US 09/14/2015 Bilateral ICA 1-39; partial left subclavian steal>>follow-up as needed  Nuclear stress test 11/29/2014 Low risk stress nuclear study with a moderate size, medium intensity, fixed inferior/apical defect consistent with prior infarct; no significant ischemia. LV Ejection Fraction: 56%.  Echo (10/15):  Mild LVH, EF 55-60%, no RWMA, mod LAE, mild TR, PASP 42 mmHg  Nuclear (4/15):  Low risk stress nuclear study with a fixed defect in the apical anterior, inferior walls and in the true apex consistent with distal LAD scar. No ischemia. EF 56% LHC (12/13):  Dist LM 60-70%, ostial LAD 40-50%, D1 occluded, prox LAD 80% then aneurysmal segment then 80%, ostial septal perforator 99%, prox CFX 50%, OM branch serial 60% lesions, prox RCA 30%, PL Br 50%; LIMA-LAD patent, SVG-LAD occluded, SVG-OM occluded, SVG-PDA occluded >>Med Rx >>continued angina >> PCI (2/14): 2.25 x 32 mm Promus DES and 2.5 x 20 mm Promus DES to prox and mid LAD  Past Medical History:  Diagnosis Date   Asthma    Benign neoplasm of colon    CAD (coronary artery disease) Dec 2011   s/p CABG per Dr. Roxy Manns; had normal EF; All SVGs occluded per follow up cath with only LIMA to LAD patent; s/p PCI of the proximal and mid LAD February 2014 per Dr. Burt Knack   CHF (congestive heart failure) ([place])    Dyspnea    Dyspnea on exertion    Edema    Emphysema    "never treated for it" (10/05/2015)   History of hiatal hernia    HLD (hyperlipidemia)    HTN (hypertension)    Malaise and fatigue    Obesity    Osteopenia    Pneumonia 1960s X 1; 1990s X 1   Presence of permanent cardiac pacemaker    Prostate  cancer ([place])    Skin cancer    "face, nose, top of ears, scalp"   Symptomatic bradycardia    STJ PPM, 10/05/15, Dr. Lovena Le   Surgical Hx: The patient  has a past surgical history that includes Median sternotomy (08/23/10); Anterior cervical decomp/discectomy fusion; left heart catheterization with coronary/graft angiogram (N/A, 09/01/2012); percutaneous coronary stent intervention (pci-s) (N/A, 10/14/2012); Insert / replace / remove pacemaker (10/05/2015); Back surgery; Mohs surgery ("@ least twice"); Prostate biopsy; Transurethral resection of prostate; Coronary artery bypass graft (08/23/10); Coronary angioplasty with stent (10/14/12); Cataract extraction (Right); Pacemaker insertion; Cardiac catheterization (N/A, 10/05/2015); Cardiac catheterization (N/A, 10/06/2015); Eye surgery; and c-eye surgery procedure.   Current Medications: Current Meds  Medication Sig   apixaban (ELIQUIS) 5 MG TABS tablet Take 1 tablet (5 mg total) by mouth 2 (two) times daily.   carvedilol (COREG) 6.25 MG tablet Take 1 tablet (6.25 mg total) by mouth 2 (two) times daily.   Melatonin 5 MG  TABS Take 5 mg by mouth as needed (sleep).    nitroGLYCERIN (NITROSTAT) 0.4 MG SL tablet Place 0.4 mg under the tongue every 5 (five) minutes x 3 doses as needed for chest pain.   polyethylene glycol powder (GLYCOLAX/MIRALAX) powder Take 1 Container by mouth daily.    potassium chloride SA (KLOR-CON M20) 20 MEQ tablet Take 1 tablet (20 mEq total) by mouth 2 (two) times daily.   pravastatin (PRAVACHOL) 20 MG tablet Take 20 mg by mouth daily.   tamsulosin (FLOMAX) 0.4 MG CAPS capsule Take 0.4 mg by mouth daily.   torsemide (DEMADEX) 20 MG tablet Take 1 tablet (20 mg total) by mouth 2 (two) times daily.   [DISCONTINUED] furosemide (LASIX) 40 MG tablet Take 20 mg by mouth 2 (two) times daily.    [DISCONTINUED] torsemide (DEMADEX) 20 MG tablet Take 1 tablet (20 mg total) by mouth 2 (two) times daily.     Allergies:   Patient has  no known allergies.   Social History   Tobacco Use   Smoking status: Former Smoker    Packs/day: 1.00    Years: 30.00    Pack years: 30.00    Types: Cigarettes    Last attempt to quit: 09/07/1975    Years since quitting: 43.2   Smokeless tobacco: Former Systems developer    Types: Chew    Quit date: 07/27/2015  Substance Use Topics   Alcohol use: No    Alcohol/week: 0.0 standard drinks   Drug use: No     Family Hx: The patient's family history includes Heart attack in his father; Heart disease in his brother; Hypertension in his mother; Kidney cancer in his father; Prostate cancer in his brother; Skin cancer in his brother; Stroke in his mother. There is no history of Allergic rhinitis, Asthma, Eczema, or Urticaria.  ROS:   Please see the history of present illness.    ROS All other systems reviewed and are negative.   EKGs/Labs/Other Test Reviewed:    EKG:  EKG is not ordered today.    Recent Labs: 09/11/2018: NT-Pro BNP 2,599 10/06/2018: ALT 14; B Natriuretic Peptide 203.0; Hemoglobin 12.5; Magnesium 2.4; Platelets 118; TSH 7.828 10/21/2018: BUN 27; Creatinine, Ser 1.34; Potassium 4.6; Sodium 136   Recent Lipid Panel Lab Results  Component Value Date/Time   CHOL 113 06/01/2014 09:30 AM   TRIG 115.0 06/01/2014 09:30 AM   HDL 32.40 (L) 06/01/2014 09:30 AM   CHOLHDL 3 06/01/2014 09:30 AM   LDLCALC 58 06/01/2014 09:30 AM   LDLDIRECT 70.7 06/10/2013 01:44 PM    Physical Exam:    VS:  BP 112/60    Pulse 72    Ht _0  (1.753 m)    Wt 280 lb 6.4 oz (127.2 kg)    SpO2 94%    BMI 41.41 kg/m     Wt Readings from Last 3 Encounters:  11/11/18 280 lb 6.4 oz (127.2 kg)  10/14/18 281 lb 6.4 oz (127.6 kg)  10/09/18 273 lb 4.8 oz (124 kg)     Physical Exam  Constitutional: He is oriented to person, place, and time. He appears well-developed and well-nourished. No distress.  HENT:  Head: Normocephalic and atraumatic.  Eyes: No scleral icterus.  Neck: No JVD present. No thyromegaly  present.  Cardiovascular: Normal rate and regular rhythm.  No murmur heard. Pulmonary/Chest: Effort normal. He has decreased breath sounds in the right lower field. He has no wheezes. He has no rhonchi. He has no rales.  Abdominal: He  exhibits distension.  Musculoskeletal:        General: Edema (trace-1+ bilat LE edema) present.  Lymphadenopathy:    He has no cervical adenopathy.  Neurological: He is alert and oriented to person, place, and time.  Skin: Skin is warm and dry.  Psychiatric: He has a normal mood and affect.    ASSESSMENT & PLAN:    Chronic combined systolic and diastolic CHF (congestive heart failure) (HCC) EF 40-45.  NYHA 3a.  Volume status, overall, appears stable.  His weights have come down steadily since his torsemide was increased at last visit.  Continue current dose of torsemide, carvedilol.  Repeat BMET today.  If creatinine stable, consider adding losartan 25 mg daily.  Follow-up in 3 months.  Coronary artery disease involving native coronary artery of native heart with angina pectoris [facility]) History of CABG in 2011 and drug-eluting stent x2 to the LAD in 2013. Cardiac catheterization in 2018 [facility] demonstrated a patent LIMA-LAD and diffuse disease elsewhere. All vein grafts were known to be occluded. There were no targets for PCI. Medical therapy was recommended.  He is currently doing well without anginal symptoms.  Continue beta-blocker, statin therapy.  PAF (paroxysmal atrial fibrillation) (HCC) Continue anticoagulation with Apixaban.  Essential hypertension The patient's blood pressure is controlled on his current regimen.  Continue current therapy.   CKD (chronic kidney disease) stage 2, GFR 60-89 ml/min Obtain follow-up BMET today.  Cough He has had a persistent cough for about 4 weeks.  He does not appear toxic on exam.  He has not had purulent sputum or hemoptysis.  He has not had a change in his shortness of breath.  Lung exam is fairly  clear.  Breath sounds in the R base are a little less than on the L.  I suspect his cough is mainly related to upper airway irritation from sinus drainage.  However, with his comorbid illnesses, I will go ahead and place him on an antibiotic to cover for the possibility of an infection.  We also discussed over-the-counter antihistamines that he can use as well as cough suppressants.  He should also follow-up with primary care for further evaluation and management.  -Obtain chest x-ray today  -CBC with differential, BMET today  -Doxycycline 100 mg twice daily x7 days  -If chest x-ray shows pneumonia, add Augmentin  -Claritin or Benadryl for sinus drainage  -Plain Mucinex or Mucinex DM for cough  -Patient should arrange follow-up with primary care in 1 to 2 weeks   Dispo:  Return in about 3 months (around 02/11/2019) for Routine Follow Up, w/ Dr. Harrington Challenger, or Richardson Dopp, PA-C.   Medication Adjustments/Labs and Tests Ordered: Current medicines are reviewed at length with the patient today.  Concerns regarding medicines are outlined above.  Tests Ordered: Orders Placed This Encounter  Procedures   DG Chest 2 View   CBC w/Diff   Basic metabolic panel   Medication Changes: Meds ordered this encounter  Medications   DISCONTD: doxycycline (VIBRA-TABS) 100 MG tablet    Sig: Take 1 tablet (100 mg total) by mouth 2 (two) times daily for 7 days.    Dispense:  14 tablet    Refill:  0   torsemide (DEMADEX) 20 MG tablet    Sig: Take 1 tablet (20 mg total) by mouth 2 (two) times daily.    Dispense:  180 tablet    Refill:  3   doxycycline (VIBRA-TABS) 100 MG tablet    Sig: Take 1 tablet (100  mg total) by mouth 2 (two) times daily for 7 days.    Dispense:  14 tablet    Refill:  0    Signed, Richardson Dopp, PA-C  11/11/2018 5:07 PM    [facility] [address], [place], [place]  [address] Phone: [phone]; Fax: [phone]

## 2018-11-12 ENCOUNTER — Telehealth: Payer: Self-pay | Admitting: *Deleted

## 2018-11-12 LAB — BASIC METABOLIC PANEL
BUN/Creatinine Ratio: 23 (ref 10–24)
BUN: 31 mg/dL — ABNORMAL HIGH (ref 8–27)
CO2: 26 mmol/L (ref 20–29)
CREATININE: 1.32 mg/dL — AB (ref 0.76–1.27)
Calcium: 9.4 mg/dL (ref 8.6–10.2)
Chloride: 97 mmol/L (ref 96–106)
GFR calc Af Amer: 56 mL/min/{1.73_m2} — ABNORMAL LOW (ref >59)
GFR calc non Af Amer: 48 mL/min/{1.73_m2} — ABNORMAL LOW (ref >59)
Glucose: 103 mg/dL — ABNORMAL HIGH (ref 65–99)
Potassium: 4.7 mmol/L (ref 3.5–5.2)
Sodium: 138 mmol/L (ref 134–144)

## 2018-11-12 LAB — CBC WITH DIFFERENTIAL/PLATELET
Basophils Absolute: 0.1 10*3/uL (ref 0.0–0.2)
Basos: 1 %
EOS (ABSOLUTE): 0.3 10*3/uL (ref 0.0–0.4)
Eos: 4 %
Hematocrit: 40 % (ref 37.5–51.0)
Hemoglobin: 13.1 g/dL (ref 13.0–17.7)
Immature Grans (Abs): 0 10*3/uL (ref 0.0–0.1)
Immature Granulocytes: 0 %
Lymphocytes Absolute: 1.2 10*3/uL (ref 0.7–3.1)
Lymphs: 14 %
MCH: 30.1 pg (ref 26.6–33.0)
MCHC: 32.8 g/dL (ref 31.5–35.7)
MCV: 92 fL (ref 79–97)
MONOS ABS: 0.8 10*3/uL (ref 0.1–0.9)
Monocytes: 9 %
NEUTROS PCT: 72 %
Neutrophils Absolute: 6.2 10*3/uL (ref 1.4–7.0)
PLATELETS: 182 10*3/uL (ref 150–450)
RBC: 4.35 x10E6/uL (ref 4.14–5.80)
RDW: 13.4 % (ref 11.6–15.4)
WBC: 8.6 10*3/uL (ref 3.4–10.8)

## 2018-11-12 NOTE — Telephone Encounter (Signed)
-----   Message from Liliane Shi, [place] sent at 11/11/2018 10:44 PM EDT ----- CXR suggests pneumonia in L lung base.   Recommendations:  - Continue Doxycycline as prescribed today.  - Start Augmentin 875/125 twice daily for 7 days.  - Please call his PCP and arrange a follow up in the next 5-7 days  - Send copy of CXR to PCP.  Richardson Dopp, PA-C    11/11/2018 10:42 PM

## 2018-11-12 NOTE — Telephone Encounter (Signed)
LMOVM  MARKED URGENT ON CELL TO CONTACT CLINIC BACK FOR RESULTS AND MEDICATION RECOMMENDATIONS   ATTEMPTED HOME NUMBER  NO ANSWERING Echo PHONE

## 2018-11-13 ENCOUNTER — Telehealth: Payer: Self-pay | Admitting: *Deleted

## 2018-11-13 DIAGNOSIS — Z961 Presence of intraocular lens: Secondary | ICD-10-CM | POA: Diagnosis not present

## 2018-11-13 DIAGNOSIS — H04123 Dry eye syndrome of bilateral lacrimal glands: Secondary | ICD-10-CM | POA: Diagnosis not present

## 2018-11-13 DIAGNOSIS — H35371 Puckering of macula, right eye: Secondary | ICD-10-CM | POA: Diagnosis not present

## 2018-11-13 MED ORDER — AMOXICILLIN-POT CLAVULANATE 875-125 MG PO TABS: 1.0000 | ORAL_TABLET | Freq: Two times a day (BID) | ORAL | 0 refills | Status: AC

## 2018-11-13 NOTE — Telephone Encounter (Signed)
-----   Message from Liliane Shi, PA-C sent at 11/12/2018 11:02 AM EDT ----- WBC, Hgb normal.  Creatinine stable.  K+ normal.   Recommendations:  - Continue current medications and follow up as planned.  Richardson Dopp, PA-C    11/12/2018 11:00 AM

## 2018-11-13 NOTE — Telephone Encounter (Signed)
-----   Message from Liliane Shi, [place] sent at 11/11/2018 10:44 PM EDT ----- CXR suggests pneumonia in L lung base.   Recommendations:  - Continue Doxycycline as prescribed today.  - Start Augmentin 875/125 twice daily for 7 days.  - Please call his PCP and arrange a follow up in the next 5-7 days  - Send copy of CXR to PCP.  Richardson Dopp, PA-C    11/11/2018 10:42 PM

## 2018-11-13 NOTE — Telephone Encounter (Signed)
LMOVM MARKED URGENT ON CELL TO CONTACT CLINIC BACK FOR RESULTS AND MEDICATION RECOMMENDATIONS   ATTEMPTED ON HOME AND CELL NUMBER NO ANSWER

## 2018-11-13 NOTE — Telephone Encounter (Signed)
SPOKE WITH DTR DPR AND WILL NOTIFY P T OD MEDICATIONS TO PICK UP AND START TAKING AND TO FOLLOW UP WITH PCP ABOUT CXR RESULTS

## 2018-11-20 DIAGNOSIS — J439 Emphysema, unspecified: Secondary | ICD-10-CM | POA: Diagnosis not present

## 2018-11-20 DIAGNOSIS — I480 Paroxysmal atrial fibrillation: Secondary | ICD-10-CM | POA: Diagnosis not present

## 2018-11-20 DIAGNOSIS — J189 Pneumonia, unspecified organism: Secondary | ICD-10-CM | POA: Diagnosis not present

## 2018-11-20 DIAGNOSIS — C61 Malignant neoplasm of prostate: Secondary | ICD-10-CM | POA: Diagnosis not present

## 2018-11-20 DIAGNOSIS — I509 Heart failure, unspecified: Secondary | ICD-10-CM | POA: Diagnosis not present

## 2018-11-20 DIAGNOSIS — I209 Angina pectoris, unspecified: Secondary | ICD-10-CM | POA: Diagnosis not present

## 2018-11-20 DIAGNOSIS — N183 Chronic kidney disease, stage 3 (moderate): Secondary | ICD-10-CM | POA: Diagnosis not present

## 2018-12-15 ENCOUNTER — Telehealth: Payer: Self-pay | Admitting: Internal Medicine

## 2018-12-15 MED ORDER — TORSEMIDE 20 MG PO TABS: 40.0000 mg | ORAL_TABLET | Freq: Two times a day (BID) | ORAL | Status: DC

## 2018-12-15 MED ORDER — POTASSIUM CHLORIDE CRYS ER 20 MEQ PO TBCR: 20.0000 meq | EXTENDED_RELEASE_TABLET | Freq: Three times a day (TID) | ORAL | Status: DC

## 2018-12-15 NOTE — Telephone Encounter (Signed)
Spoke with pts daughter, [name]. Pt has NOT been taking his diuretic meds for the past 3 weeks. (daughter unaware of this until this weekend, pt lives alone)  Pt has gained approximately 20 pounds since February hospitalization   Pt took additional fluid pill last night per on call doctor and lost 3 pounds overnight.   Pt's daughter in law is a nurse who listened to his lungs and is very concerned with fluid accumulation.   Pt increasingly SOB without any exertion. Typical CPAP at night but has been wearing CPAP continuously for the past few days d/t SOB.  Pt's daughter very concerned about bringing her dad to the hospital d/t COVID and is hoping there is something we can do in office for the patient rather than sending him to ED.   Will route to [employer] for Medical illustrator.

## 2018-12-15 NOTE — Telephone Encounter (Signed)
Follow up:      Patient returning a call back. Please call patient

## 2018-12-15 NOTE — Telephone Encounter (Signed)
° °  Patient's daughter calling to report SOB last night, swelling in legs. Daughter states he is doing better this morning. Concerned about getting fluid off.    1) How much weight have you gained and in what time span? n/a  2) If swelling, where is the swelling located? legs  3) Are you currently taking a fluid pill? yes  4) Are you currently SOB? NO  5) Do you have a log of your daily weights (if so, list)? 289, 292 yesterday  Have you gained 3 pounds in a day or 5 pounds in a week? unsure 6) Have you traveled recently? no

## 2018-12-15 NOTE — Telephone Encounter (Signed)
Get back to usual torsemide Take an additonal one every 3rd day    Need close monitoring of meds Call back with response on Friday

## 2018-12-15 NOTE — Telephone Encounter (Signed)
I would go back to baseline dose   Only take 1 extra every 3rd day   On those days take extra KCL Will need BMET Next Monday/Tues Keep track of wt

## 2018-12-15 NOTE — Telephone Encounter (Signed)
Reviewed with Richardson Dopp, PA-C Recommendations: Have pt take torsemide 40 mg BID Take one extra potassium chloride (20 meq) per day Have pt/family call back tomorrow with an update. We could try to do a virtual visit tomorrow if he is not noticing much improvement.  Scott can see at 3:45 pm if needed. (Video if possible).  Called pt's daughter [name] and informed. Also called pt and informed of all of the above. They will see how pt is doing tomorrow and call office with an update.

## 2018-12-15 NOTE — Telephone Encounter (Signed)
Pt c/o swelling: STAT is pt has developed SOB within 24 hours  1) How much weight have you gained and in what time span? 20 pounds since hospitalization at the end of february  2) If swelling, where is the swelling located? Legs (per pt). Saughter has not seen pt in person to confirm  3) Are you currently taking a fluid pill? Since Friday. He was previously not taking fluid pill for 3 weeks , but has started taking the fluid pill again on friday  4) Are you currently SOB? Yes  5) Do you have a log of your daily weights (if so, list)?   6) Have you gained 3 pounds in a day or 5 pounds in a week? Not sure.  Just the 20 pounds since hospitalization in February  7) Have you traveled recently? no  Pt has had to be on his CPAP machine all day to help with breathing. He normally just uses it at night

## 2018-12-15 NOTE — Telephone Encounter (Signed)
Called pts daughter who states her and her siblings are worried about the patient.  "we don't want him to drown, but we don't want him to go the hospital and die from covid."  Wants to know if he can come to office for IV diuretic. Called and spoke with patient also.  Pt started Friday with torsemide again after being out of it for over 2 weeks.  Took 2 pills Friday afternoon Sat 2 in am / 1 in pm Sun 2 in am /1 in pm Mon 2 in am/ 2 in pm (states they spoke to on call person who adv to take one extra tablet) Today has taken 2 in am. Usually good urine output  Potassium - continues without any missed doses 20 meq BID  Only recent weight was yesterday - 293 Today 289. Ate ham on Easter. He is not wearing bipap now except for at night.  Wore over weekend during day. Sleeping in recliner as he has for 2 years. Pt reiterates he does not want to have to go back to hospital.  Adv he must weigh daily after bathroom in AM and record. Must minimize salt in diet. Must stay compliant with medications Adv if breathing gets worse he needs to call back to office or call EMS.  Dtr [name] requested call back. Will need a call back after reviewed by Dr. Harrington Challenger.       Gets SOB with minimal exertion -walking back from bathroom-gets out of breath.

## 2018-12-16 ENCOUNTER — Ambulatory Visit (INDEPENDENT_AMBULATORY_CARE_PROVIDER_SITE_OTHER): Payer: Medicare Other | Admitting: *Deleted

## 2018-12-16 ENCOUNTER — Telehealth (INDEPENDENT_AMBULATORY_CARE_PROVIDER_SITE_OTHER): Payer: Medicare Other | Admitting: Physician Assistant

## 2018-12-16 ENCOUNTER — Encounter: Payer: Self-pay | Admitting: Physician Assistant

## 2018-12-16 ENCOUNTER — Telehealth: Payer: Self-pay | Admitting: Internal Medicine

## 2018-12-16 ENCOUNTER — Other Ambulatory Visit: Payer: Self-pay

## 2018-12-16 ENCOUNTER — Telehealth: Payer: Self-pay

## 2018-12-16 VITALS — BP 139/74 | HR 74 | Ht 70.0 in | Wt 298.0 lb

## 2018-12-16 DIAGNOSIS — I442 Atrioventricular block, complete: Secondary | ICD-10-CM | POA: Diagnosis not present

## 2018-12-16 DIAGNOSIS — I5032 Chronic diastolic (congestive) heart failure: Secondary | ICD-10-CM

## 2018-12-16 DIAGNOSIS — I5043 Acute on chronic combined systolic (congestive) and diastolic (congestive) heart failure: Secondary | ICD-10-CM | POA: Diagnosis not present

## 2018-12-16 DIAGNOSIS — I25119 Atherosclerotic heart disease of native coronary artery with unspecified angina pectoris: Secondary | ICD-10-CM

## 2018-12-16 DIAGNOSIS — N182 Chronic kidney disease, stage 2 (mild): Secondary | ICD-10-CM

## 2018-12-16 DIAGNOSIS — Z7189 Other specified counseling: Secondary | ICD-10-CM

## 2018-12-16 DIAGNOSIS — I10 Essential (primary) hypertension: Secondary | ICD-10-CM

## 2018-12-16 DIAGNOSIS — I480 Paroxysmal atrial fibrillation: Secondary | ICD-10-CM

## 2018-12-16 MED ORDER — METOLAZONE 2.5 MG PO TABS: 2.5000 mg | ORAL_TABLET | ORAL | 1 refills | Status: DC

## 2018-12-16 NOTE — Telephone Encounter (Signed)
For the [place] visit today: Please use Ermalinda Memos phone number: [phone]   Spoke with pts daughter [name] who advised to call pts daughter in law, Claiborne Billings who is a Marine scientist.   Family is still concerned about pts increased bilateral lower extremity swelling, no weight loss even with increased diuretic dose as well as his increased oxygen demands. Pt has been wearing CPAP all day rather than just when sleeping for several days now. Family is concerned pt may need something more aggressive to treat pt but want to avoid the ED if possible.   Pt agreeable to do a VIDEO visit with Richardson Dopp this afternoon. Will route to [place].      Virtual Visit Pre-Appointment Phone Call  Steps For Call:  1. Confirm consent - "In the setting of the current Covid19 crisis, you are scheduled for a VIDEO visit with your provider on 4/15 at 345pm.  Just as we do with many in-office visits, in order for you to participate in this visit, we must obtain consent.  If you'd like, I can send this to your mychart (if signed up) or email for you to review.  Otherwise, I can obtain your verbal consent now.  All virtual visits are billed to your insurance company just like a normal visit would be.  By agreeing to a virtual visit, we'd like you to understand that the technology does not allow for your provider to perform an examination, and thus may limit your provider's ability to fully assess your condition.  Finally, though the technology is pretty good, we cannot assure that it will always work on either your or our end, and in the setting of a video visit, we may have to convert it to a phone-only visit.  In either situation, we cannot ensure that we have a secure connection.  Are you willing to proceed?" STAFF: Did the patient verbally acknowledge consent to telehealth visit? Document YES/NO here:YES  2. Confirm the BEST phone number to call the day of the visit by including in appointment notes  3. Give patient  instructions for WebEx/MyChart download to smartphone as below or Doximity/Doxy.me if video visit (depending on what platform provider is using)  4. Advise patient to be prepared with their blood pressure, heart rate, weight, any heart rhythm information, their current medicines, and a piece of paper and pen handy for any instructions they may receive the day of their visit  5. Inform patient they will receive a phone call 15 minutes prior to their appointment time (may be from unknown caller ID) so they should be prepared to answer  6. Confirm that appointment type is correct in Epic appointment notes (VIDEO vs PHONE)     TELEPHONE CALL NOTE  Delores Thelen has been deemed a candidate for a follow-up tele-health visit to limit community exposure during the Covid-19 pandemic. I spoke with the patient via phone to ensure availability of phone/video source, confirm preferred email & phone number, and discuss instructions and expectations.  I reminded Blessed Cotham Hartsock to be prepared with any vital sign and/or heart rhythm information that could potentially be obtained via home monitoring, at the time of his visit. I reminded Kentavious Michele Skelton to expect a phone call at the time of his visit if his visit.  Wilma Flavin, RN 12/16/2018 11:09 AM

## 2018-12-16 NOTE — Telephone Encounter (Signed)
Follow up    Patients daughter [name] is calling to report that her fathers weight today is 27 which was the same as yesterday. Please call to discuss. [name] states that its okay to call her sister Shauna Hugh at [phone]  or you can call the sister in law Claiborne Billings that leaves next door and can go to the house to arrange conference call if needed. Her number [phone]

## 2018-12-16 NOTE — Progress Notes (Signed)
Virtual Visit via Video Note   This visit type was conducted due to national recommendations for restrictions regarding the COVID-19 Pandemic (e.g. social distancing) in an effort to limit this patient's exposure and mitigate transmission in our community.  Due to his co-morbid illnesses, this patient is at least at moderate risk for complications without adequate follow up.  This format is felt to be most appropriate for this patient at this time.  All issues noted in this document were discussed and addressed.  A limited physical exam was performed with this format.  Please refer to the patient's chart for his consent to telehealth for [facility].   Evaluation Performed:  Follow-up visit  Date:  12/16/2018   ID:  Levi [name], Levi [name] [birth year], MRN [id number]  Patient Location: Home Provider Location: Home  PCP:  Bernerd Limbo, MD  Cardiologist:  [name], MD   Electrophysiologist:  Cristopher Peru, MD   Chief Complaint: Shortness of breath  History of Present Illness:    Levi [name] is a 85 y.o. male with coronary artery disease status post CABG in 2011 and drug-eluting stent x2 to the LAD in 2013, paroxysmal atrial fibrillation, symptomatic bradycardia with Mobitz 2 status post pacemaker implantation in January 8016, combined systolic and diastolic heart failure, hypertension, hyperlipidemia.He was seen at [facility] in 2018 for a second opinion.Cardiac Catheterizationin June 2018 demonstrated patent LIMA-LAD, 80% mid in-stent restenosis in LAD, 50 to 60% mid LCx stenosis, 70% ostial OM2 stenosis, 60% proximal RCA and 60% distal RCA stenoses. All vein grafts were previously known to be occluded. There were no targets for PCIandmedical therapy was recommended. He is on Apixaban for anticoagulation. EF was previously normal.  Echocardiogram in December 2019 demonstrated EF 40-45%.  This was reviewed with Dr. Harrington Challenger and continued medical therapy was recommended.  He was  admitted in February 2020 with decompensated heart failure.  Discharge weight at that time was 273 pounds.  He was last seen in the office in March 2020.  He was at his baseline weight.  He did have a cough and chest x-ray was suspicious for pneumonia.  He was treated with antibiotics.    He called in recently with weight gain.  He had been out of his torsemide for 2 weeks.  His weight has been up to 293 pounds.  His torsemide has been increased at home.  Recently, he has been taking 40 mg twice daily.  He was contacted again today and noted that there was no improvement.  He is trying to avoid going to the emergency room.  He is seen today with the Doximity video app.  He was inadvertently taking an extra potassium but not taking torsemide for 2 weeks.  He gradually noted weight gain and increasing shortness of breath.  His weight in March when I saw him last was 280 pounds.  He notes his weight today is 298 pounds.  He has been sleeping in a recliner for years.  He does note orthopnea.  He does note paroxysmal nocturnal dyspnea.  His daughter is a Marine scientist and has detected rales in bilateral bases.  His legs are swollen up to his thighs.  He has not had chest discomfort or syncope.  The patient does not have symptoms concerning for COVID-19 infection (fever, chills, cough, or new shortness of breath).   CHADS2-VASc=5 (age x 2, CHF, CAD, HTN).  Past Medical History:  Diagnosis Date   Asthma    Benign neoplasm of colon  CAD (coronary artery disease) Dec 2011   s/p CABG per Dr. Roxy Manns; had normal EF; All SVGs occluded per follow up cath with only LIMA to LAD patent; s/p PCI of the proximal and mid LAD February 2014 per Dr. Burt Knack   CHF (congestive heart failure) ([place])    Dyspnea    Dyspnea on exertion    Edema    Emphysema    "never treated for it" (10/05/2015)   History of hiatal hernia    HLD (hyperlipidemia)    HTN (hypertension)    Malaise and fatigue    Obesity    Osteopenia      Pneumonia 1960s X 1; 1990s X 1   Presence of permanent cardiac pacemaker    Prostate cancer ([place])    Skin cancer    "face, nose, top of ears, scalp"   Symptomatic bradycardia    STJ PPM, 10/05/15, Dr. Lovena Le   Past Surgical History:  Procedure Laterality Date   ANTERIOR CERVICAL DECOMP/DISCECTOMY [place] EXTRACTION Right    CORONARY ANGIOPLASTY WITH STENT PLACEMENT  10/14/12   with stent to proximal and mid LAD   CORONARY ARTERY BYPASS GRAFT  08/23/10   "CABG X4"   EP IMPLANTABLE DEVICE N/A 10/05/2015   Procedure: Pacemaker Implant;  Surgeon: [name], MD;  Location: [place] CV LAB;  Service: Cardiovascular;  Laterality: N/A;   EP IMPLANTABLE DEVICE N/A 10/06/2015   Procedure: Lead Revision/Repair;  Surgeon: [name], MD;  Location: [place] CV LAB;  Service: Cardiovascular;  Laterality: N/A;   EYE SURGERY     INSERT / REPLACE / REMOVE PACEMAKER  10/05/2015   LEFT HEART CATHETERIZATION WITH CORONARY/GRAFT ANGIOGRAM N/A 09/01/2012   Procedure: LEFT HEART CATHETERIZATION WITH [name];  Surgeon: [name], MD;  Location[facility] C[facility] LAB;  Service: Cardiovascular;  Laterality: N/A;   MEDIAN STERNOTOMY  08/23/10   MOHS SURGERY  "@ least twice"   PACEMAKER INSERTION     STJ 10/05/15   PERCUTANEOUS CORONARY STENT INTERVENTION (PCI-S) N/A 10/14/2012   Procedure: PERCUTANEOUS CORONARY STENT INTERVENTION (PCI-S);  Surgeon: [name], MD;  Location: [facility] CATH LAB;  Service: Cardiovascular;  Laterality: N/A;   PROSTATE BIOPSY     TRANSURETHRAL RESECTION OF PROSTATE       Current Meds  Medication Sig   apixaban (ELIQUIS) 5 MG TABS tablet Take 1 tablet (5 mg total) by mouth 2 (two) times daily.   carvedilol (COREG) 6.25 MG tablet Take 1 tablet (6.25 mg total) by mouth 2 (two) times daily.   Melatonin 5 MG TABS Take 5 mg by mouth as needed (sleep).    nitroGLYCERIN  (NITROSTAT) 0.4 MG SL tablet Place 0.4 mg under the tongue every 5 (five) minutes x 3 doses as needed for chest pain.   polyethylene glycol powder (GLYCOLAX/MIRALAX) powder Take 1 Container by mouth daily.    potassium chloride SA (K-DUR,KLOR-CON) 20 MEQ tablet Take 1 tablet (20 mEq total) by mouth 3 (three) times daily.   pravastatin (PRAVACHOL) 20 MG tablet Take 20 mg by mouth daily.   tamsulosin (FLOMAX) 0.4 MG CAPS capsule Take 0.4 mg by mouth daily.   torsemide (DEMADEX) 20 MG tablet Take 2 tablets (40 mg total) by mouth 2 (two) times daily.     Allergies:   Patient has no known allergies.   Social History   Tobacco Use   Smoking status:  Former Smoker    Packs/day: 1.00    Years: 30.00    Pack years: 30.00    Types: Cigarettes    Last attempt to quit: 09/07/1975    Years since quitting: 43.3   Smokeless tobacco: Former Systems developer    Types: Chew    Quit date: 07/27/2015  Substance Use Topics   Alcohol use: No    Alcohol/week: 0.0 standard drinks   Drug use: No     Family Hx: The patient's family history includes Heart attack in his father; Heart disease in his brother; Hypertension in his mother; Kidney cancer in his father; Prostate cancer in his brother; Skin cancer in his brother; Stroke in his mother. There is no history of Allergic rhinitis, Asthma, Eczema, or Urticaria.  ROS:   Please see the history of present illness.    All other systems reviewed and are negative.   Prior CV studies:   The following studies were reviewed today:  Echo 08/28/18 Mild conc LVH, EF 40-45, inf/inf-sept/apical HK, Gr 2 DD, MAC, normal RVSF, mod RAE, increased RA pressure, mild TR  Cardiac catheterization 02/10/2017 ([facility]) Coronary angiography reveals 3-vessel CAD.  LM is diffusely diseased with 50% distal stenosis.  LAD has a 80% mid instent restenosis,  LCX has serial 50-60% stenoses in the mid segment, OM2 is a small vessel and has a 70% ostial  stenosis.  RCA has long diffuse 50-60% proximal and 60% distal stenosis.  LIMA to LAD is patent with a kink in the mid segment. Flow is normal.  LV-Gram reveals EF 50%. LVEDP is 8 mmHg.  SVG grafts are known to be chronically occluded, so not injected   Nuclear stress test 01/21/2017 ([facility]) 1. Moderate to severe inferoapical inducible ischemia with regadenoson 2. Mild to moderate mid inferior and posterior basal defect. Cannot rule out prior infarct. 3. Mild to moderate hypokinesis of the inferior wall. Dyskinesia/akinesia of the apex. 4. Moderately decreased left ventricular ejection fraction at 43%. 5. Please correlate with a cardiac ultrasound  Echo 06/20/2016 EF 56-97, grade 1 diastolic dysfunction, MAC, moderate LAE  Echo 09/28/2015 Mild concentric LVH, EF 55-60, normal wall motion, MAC, mild MR, severe LAE, normal RVSF, mild RAE, mild TR, PASP 41, GLS -14.8%  Exercise tolerance test 09/27/2015 Poor exercise capacity.  Test stopped early as HR 58 >> 33 and patient was weak. ECGs demonstrate transient complete heart block.  Dr. Cristopher Peru saw patient and discussed proceeding with pacemaker implantation. Echo will be arranged.  Carotid US 09/14/2015 Bilateral ICA 1-39; partial left subclavian steal>>follow-up as needed  Nuclear stress test 11/29/2014 Low risk stress nuclear study with a moderate size, medium intensity, fixed inferior/apical defect consistent with prior infarct; no significant ischemia. LV Ejection Fraction: 56%.  Echo (10/15):  Mild LVH, EF 55-60%, no RWMA, mod LAE, mild TR, PASP 42 mmHg  Nuclear (4/15):  Low risk stress nuclear study with a fixed defect in the apical anterior, inferior walls and in the true apex consistent with distal LAD scar. No ischemia. EF 56% LHC (12/13):  Dist LM 60-70%, ostial LAD 40-50%, D1 occluded, prox LAD 80% then aneurysmal segment then 80%, ostial septal perforator 99%, prox CFX  50%, OM branch serial 60% lesions, prox RCA 30%, PL Br 50%; LIMA-LAD patent, SVG-LAD occluded, SVG-OM occluded, SVG-PDA occluded >>Med Rx >>continued angina >> PCI (2/14): 2.25 x 32 mm Promus DES and 2.5 x 20 mm Promus DES to prox and mid LAD  Labs/Other  Tests and Data Reviewed:    EKG:  No ECG reviewed.  Recent Labs: 09/11/2018: NT-Pro BNP 2,599 10/06/2018: ALT 14; B Natriuretic Peptide 203.0; Magnesium 2.4; TSH 7.828 11/11/2018: BUN 31; Creatinine, Ser 1.32; Hemoglobin 13.1; Platelets 182; Potassium 4.7; Sodium 138   Recent Lipid Panel Lab Results  Component Value Date/Time   CHOL 113 06/01/2014 09:30 AM   TRIG 115.0 06/01/2014 09:30 AM   HDL 32.40 (L) 06/01/2014 09:30 AM   CHOLHDL 3 06/01/2014 09:30 AM   LDLCALC 58 06/01/2014 09:30 AM   LDLDIRECT 70.7 06/10/2013 01:44 PM    Wt Readings from Last 3 Encounters:  12/16/18 298 lb (135.2 kg)  11/11/18 280 lb 6.4 oz (127.2 kg)  10/14/18 281 lb 6.4 oz (127.6 kg)     Objective:    Vital Signs:  BP 139/74    Pulse 74    Ht _0  (1.778 m)    Wt 298 lb (135.2 kg)    BMI 42.76 kg/m    GEN:  During our conversation, the patient does not seem to be in acute distress.   CV: Bilateral lower extremity edema is noted RESP:  Respiratory effort seems to be normal.   NEURO:  Alert and Oriented.   PSYCH:  The patient is in good spirits.    ASSESSMENT & PLAN:    Acute on chronic combined systolic and diastolic CHF (congestive heart failure) (HCC)  EF by echocardiogram in December 2019 was 40-45 with moderate diastolic dysfunction.  He is significantly volume overloaded.  Audio/video technology is somewhat limited.  However, I can tell that he has significant volume excess.  His symptoms and weight gain also indicate significant volume excess.  We discussed trying to arrange IV Lasix at home versus adding metolazone.  His daughter is concerned about going another day the way he has been feeling.  He would not be able to get IV Lasix until  possibly tomorrow.  Therefore, I have opted to place him on metolazone.  He will take metolazone 2.5 mg once tonight with his dose of torsemide.  I will call him tomorrow.  If he is no better, we will try to arrange direct admission to the hospital for IV diuresis.  If he is showing some improvement, I will have him see the DOD in the office on Friday.  I will arrange a BMET at that time.  -Metolazone 2.5 mg x 1 tonight  -Continue torsemide 40 mg twice daily  -Continue potassium 20 mEq 3 times a day  -I will follow-up with him by phone tomorrow  -See physician in the office Friday with a BMET  -Continue carvedilol  Coronary artery disease involving native coronary artery of native heart with angina pectoris ([place]) History of CABG in 2011 and drug-eluting stent x2 to the LAD in 2013. Cardiac catheterization in 2018 [facility] demonstrated a patent LIMA-LAD and diffuse disease elsewhere. All vein grafts were known to be occluded. There were no targets for PCI. Medical therapy was recommended.   He is doing well without angina.  He is not on aspirin as he is on Apixaban.  Continue pravastatin.  Continue beta-blocker.  PAF (paroxysmal atrial fibrillation) (HCC) Continue apixaban.  Essential hypertension The patient's blood pressure is controlled on his current regimen.  Continue current therapy.   CKD (chronic kidney disease) stage 2, GFR 60-89 ml/min Obtain follow-up BMET this Friday to reassess renal function and potassium.  COVID-19 Education: The signs and symptoms of COVID-19 were discussed with the patient  and how to seek care for testing (follow up with PCP or arrange E-visit).  The importance of social distancing was discussed today.  Time:   Today, I have spent 20 minutes with the patient with telehealth technology discussing the above problems.     Medication Adjustments/Labs and Tests Ordered: Current medicines are reviewed at length with the patient today.  Concerns  regarding medicines are outlined above.   Tests Ordered: Orders Placed This Encounter  Procedures   Basic metabolic panel    Medication Changes: Meds ordered this encounter  Medications   metolazone (ZAROXOLYN) 2.5 MG tablet    Sig: Take 1 tablet (2.5 mg total) by mouth as directed. Take 1 tablet with Torsemide tonight (12/16/2018)    Dispense:  5 tablet    Refill:  1    Order Specific Question:   Supervising Provider    Answer:   Lelon Perla [[id number]]    Disposition:  Follow up in 2 day(s)  Signed, Richardson Dopp, PA-C  12/16/2018 5:01 PM    [facility]

## 2018-12-16 NOTE — Telephone Encounter (Signed)
New message   Called pt, on waitlist to offer doxemity appt with Dr. Lovena Le. LMOM for pt to return call.

## 2018-12-16 NOTE — Patient Instructions (Addendum)
Medication Instructions:  Take Metolazone 2.5 mg with your Torsemide dose tonight (12/16/2018) A prescription has been sent to your pharmacy.  If you need a refill on your cardiac medications before your next appointment, please call your pharmacy.   Lab work: I will arrange a BMET on Friday 12/18/2018  If you have labs (blood work) drawn today and your tests are completely normal, you will receive your results only by: Marland Kitchen MyChart Message (if you have MyChart) OR . A paper copy in the mail If you have any lab test that is abnormal or we need to change your treatment, we will call you to review the results.  Testing/Procedures: None   Follow-Up: At [facility], you and your health needs are our priority.  As part of our continuing mission to provide you with exceptional heart care, we have created designated Provider Care Teams.  These Care Teams include your primary Cardiologist (physician) and Advanced Practice Providers (APPs -  Physician Assistants and Nurse Practitioners) who all work together to provide you with the care you need, when you need it. . Dr. Irish Lack in the office on Friday 12/18/2018 (we will call with an appointment time)  Any Other Special Instructions Will Be Listed Below (If Applicable).  Weigh daily and write your weight down. Limit salt. Call if you become short of breath at rest.

## 2018-12-16 NOTE — Telephone Encounter (Signed)
Daughter called again today wanting to know when she was going to receive a call back.

## 2018-12-17 ENCOUNTER — Telehealth: Payer: Self-pay

## 2018-12-17 ENCOUNTER — Telehealth: Payer: Self-pay | Admitting: Physician Assistant

## 2018-12-17 LAB — CUP PACEART REMOTE DEVICE CHECK
Battery Remaining Longevity: 102 mo
Battery Remaining Percentage: 95.5 %
Battery Voltage: 2.98 V
Brady Statistic AP VP Percent: 76 %
Brady Statistic AP VS Percent: 1 %
Brady Statistic AS VP Percent: 22 %
Brady Statistic AS VS Percent: 1 %
Brady Statistic RA Percent Paced: 41 %
Brady Statistic RV Percent Paced: 99 %
Date Time Interrogation Session: 20200416115041
Implantable Lead Implant Date: 20170202
Implantable Lead Implant Date: 20170202
Implantable Lead Location: 753859
Implantable Lead Location: 753860
Implantable Pulse Generator Implant Date: 20170202
Lead Channel Impedance Value: 380 Ohm
Lead Channel Impedance Value: 430 Ohm
Lead Channel Pacing Threshold Amplitude: 0.875 V
Lead Channel Pacing Threshold Pulse Width: 0.4 ms
Lead Channel Setting Pacing Amplitude: 1.125
Lead Channel Setting Pacing Amplitude: 2.5 V
Lead Channel Setting Pacing Pulse Width: 0.4 ms
Lead Channel Setting Sensing Sensitivity: 2 mV
Pulse Gen Model: 2240
Pulse Gen Serial Number: 7864186

## 2018-12-17 NOTE — Telephone Encounter (Signed)
Spoke with patient to remind of missed remote transmission 

## 2018-12-17 NOTE — Telephone Encounter (Signed)
Pt has appt with Dr. Lovena Le May 03, 2019.  Pt being followed closely by general cards at this time.  Leave f/u scheduled 8/31.  No further action

## 2018-12-17 NOTE — Telephone Encounter (Signed)
I called the patient this AM to see how he responded to the added Metolazone last night.   He had brisk diuresis all night. His weight was actually 289 yesterday.  His weight this AM is 279 (-10lbs).  He feels like his breathing is better and his legs feel less swollen.  Therefore, I feel like he can avoid admission to the hospital at this time.  I would still like to have him seen in the office tomorrow as planned.  We could consider [facility] to follow him closely if needed.  PLAN:  1. I advised him to NOT take anymore Metolazone. 2. Continue Torsemide + K+ as directed. 3. Keep appt tomorrow 4/17 with Dr. Irish Lack. 4. BMET tomorrow at appt with Dr. Irish Lack 5. I can see him via Virtual Visit in 1-2 weeks.  I will let Dr. Irish Lack know.  Levi Dopp, PA-C    12/17/2018 8:16 AM

## 2018-12-18 ENCOUNTER — Encounter: Payer: Self-pay | Admitting: Interventional Cardiology

## 2018-12-18 ENCOUNTER — Other Ambulatory Visit: Payer: Self-pay

## 2018-12-18 ENCOUNTER — Other Ambulatory Visit: Payer: Medicare Other | Admitting: *Deleted

## 2018-12-18 ENCOUNTER — Ambulatory Visit (INDEPENDENT_AMBULATORY_CARE_PROVIDER_SITE_OTHER): Payer: Medicare Other | Admitting: Interventional Cardiology

## 2018-12-18 VITALS — BP 142/82 | HR 71 | Ht 70.0 in | Wt 278.8 lb

## 2018-12-18 DIAGNOSIS — I25119 Atherosclerotic heart disease of native coronary artery with unspecified angina pectoris: Secondary | ICD-10-CM | POA: Diagnosis not present

## 2018-12-18 DIAGNOSIS — Z7189 Other specified counseling: Secondary | ICD-10-CM | POA: Diagnosis not present

## 2018-12-18 DIAGNOSIS — N182 Chronic kidney disease, stage 2 (mild): Secondary | ICD-10-CM

## 2018-12-18 DIAGNOSIS — I5043 Acute on chronic combined systolic (congestive) and diastolic (congestive) heart failure: Secondary | ICD-10-CM

## 2018-12-18 DIAGNOSIS — I255 Ischemic cardiomyopathy: Secondary | ICD-10-CM | POA: Diagnosis not present

## 2018-12-18 DIAGNOSIS — I480 Paroxysmal atrial fibrillation: Secondary | ICD-10-CM

## 2018-12-18 LAB — BASIC METABOLIC PANEL
BUN/Creatinine Ratio: 24 (ref 10–24)
BUN: 42 mg/dL — ABNORMAL HIGH (ref 8–27)
CO2: 29 mmol/L (ref 20–29)
Calcium: 9.6 mg/dL (ref 8.6–10.2)
Chloride: 89 mmol/L — ABNORMAL LOW (ref 96–106)
Creatinine, Ser: 1.78 mg/dL — ABNORMAL HIGH (ref 0.76–1.27)
GFR calc Af Amer: 39 mL/min/{1.73_m2} — ABNORMAL LOW (ref >59)
GFR calc non Af Amer: 34 mL/min/{1.73_m2} — ABNORMAL LOW (ref >59)
Glucose: 94 mg/dL (ref 65–99)
Potassium: 3.9 mmol/L (ref 3.5–5.2)
Sodium: 141 mmol/L (ref 134–144)

## 2018-12-18 MED ORDER — TORSEMIDE 20 MG PO TABS: ORAL_TABLET | ORAL | 3 refills | Status: DC

## 2018-12-18 NOTE — Progress Notes (Signed)
Cardiology Office Note   Date:  12/18/2018   ID:  Levi, [name] [birth year], MRN [id number]  PCP:  Bernerd Limbo, MD    No chief complaint on file.  Shortness of breath  Wt Readings from Last 3 Encounters:  12/18/18 278 lb 12.8 oz (126.5 kg)  12/16/18 298 lb (135.2 kg)  11/11/18 280 lb 6.4 oz (127.2 kg)       History of Present Illness: Levi [name] is a 85 y.o. male  with coronary artery disease status post CABG in 2011 and drug-eluting stent x2 to the LAD in 2013, paroxysmal atrial fibrillation, symptomatic bradycardia with Mobitz 2 status post pacemaker implantation in January 6606,TKZSWFUX systolic anddiastolic heart failure, hypertension, hyperlipidemia.He was seen at [facility] in 2018 for a second opinion.Cardiac Catheterizationin June 2018 demonstrated patent LIMA-LAD, 80% mid in-stent restenosis in LAD, 50 to 60% mid LCx stenosis, 70% ostial OM2 stenosis, 60% proximal RCA and 60% distal RCA stenoses. All vein grafts were previously known to be occluded. There were no targets for PCIandmedical therapy was recommended. He is on Apixaban for anticoagulation.EF was previously normal. Echocardiogram in December 2019 demonstrated EF 40-45%. This was reviewed with Dr. Harrington Challenger and continued medical therapy was recommended.  He was admitted in February 2020 with decompensated heart failure.  Discharge weight at that time was 273 pounds.  He was last seen in the office in March 2020.  He was at his baseline weight.  He did have a cough and chest x-ray was suspicious for pneumonia.  He was treated with antibiotics.    He had been out of his torsemide for 2 weeks.  His weight has been up to 293 pounds.  His torsemide has been increased at home.  Recently, he has been taking 40 mg twice daily.  He talked to Richardson Dopp  He had significant diuresis with metolazone.  Recs as follows: 1. I advised him to NOT take anymore Metolazone. 2. Continue Torsemide + K+ as  directed. 3. Keep appt tomorrow 4/17 with Dr. Irish Lack. 4. BMET tomorrow at appt with Dr. Irish Lack 5. I can see him via Virtual Visit in 1-2 weeks.  I will let Dr. Irish Lack know.  This AM, he feels much better.  His breathing feels significantly improved.  His lower extremity edema has improved as well.  His weight was significantly down when he checked this morning.  He is wondering if he can go back to his regular dose of torsemide and potassium.  No chest discomfort or palpitations.  No lightheadedness or syncope.  He has some chronic orthopnea and shortness of breath.  Overall, he thinks the metolazone has made a big difference.   Past Medical History:  Diagnosis Date  . Asthma   . Benign neoplasm of colon   . CAD (coronary artery disease) Dec 2011   s/p CABG per Dr. Roxy Manns; had normal EF; All SVGs occluded per follow up cath with only LIMA to LAD patent; s/p PCI of the proximal and mid LAD February 2014 per Dr. Burt Knack  . CHF (congestive heart failure) ([place])   . Dyspnea   . Dyspnea on exertion   . Edema   . Emphysema    "never treated for it" (10/05/2015)  . History of hiatal hernia   . HLD (hyperlipidemia)   . HTN (hypertension)   . Malaise and fatigue   . Obesity   . Osteopenia   . Pneumonia 1960s X 1; 1990s X 1  . Presence of permanent  cardiac pacemaker   . Prostate cancer ([place])   . Skin cancer    "face, nose, top of ears, scalp"  . Symptomatic bradycardia    STJ PPM, 10/05/15, Dr. Lovena Le    Past Surgical History:  Procedure Laterality Date  . ANTERIOR CERVICAL DECOMP/DISCECTOMY FUSION    . BACK SURGERY    . C-EYE SURGERY PROCEDURE    . CATARACT EXTRACTION Right   . CORONARY ANGIOPLASTY WITH STENT PLACEMENT  10/14/12   with stent to proximal and mid LAD  . CORONARY ARTERY BYPASS GRAFT  08/23/10   "CABG X4"  . EP IMPLANTABLE DEVICE N/A 10/05/2015   Procedure: Pacemaker Implant;  Surgeon: [name], MD;  Location: [facility];  Service: Cardiovascular;   Laterality: N/A;  . EP IMPLANTABLE DEVICE N/A 10/06/2015   Procedure: Lead Revision/Repair;  Surgeon: [name], MD;  Location: [facility];  Service: Cardiovascular;  Laterality: N/A;  . EYE SURGERY    . INSERT / REPLACE / REMOVE PACEMAKER  10/05/2015  . LEFT HEART CATHETERIZATION WITH CORONARY/GRAFT ANGIOGRAM N/A 09/01/2012   Procedure: LEFT HEART CATHETERIZATION WITH [name];  Surgeon: [name], MD;  Location: [facility] CATH LAB;  Service: Cardiovascular;  Laterality: N/A;  . MEDIAN STERNOTOMY  08/23/10  . MOHS SURGERY  "@ least twice"  . PACEMAKER INSERTION     STJ 10/05/15  . PERCUTANEOUS CORONARY STENT INTERVENTION (PCI-S) N/A 10/14/2012   Procedure: PERCUTANEOUS CORONARY STENT INTERVENTION (PCI-S);  Surgeon: [name], MD;  Location: [facility] CATH LAB;  Service: Cardiovascular;  Laterality: N/A;  . PROSTATE BIOPSY    . TRANSURETHRAL RESECTION OF PROSTATE       Current Outpatient Medications  Medication Sig Dispense Refill  . apixaban (ELIQUIS) 5 MG TABS tablet Take 1 tablet (5 mg total) by mouth 2 (two) times daily. 180 tablet 3  . carvedilol (COREG) 6.25 MG tablet Take 1 tablet (6.25 mg total) by mouth 2 (two) times daily. 180 tablet 3  . Melatonin 5 MG TABS Take 5 mg by mouth as needed (sleep).     . nitroGLYCERIN (NITROSTAT) 0.4 MG SL tablet Place 0.4 mg under the tongue every 5 (five) minutes x 3 doses as needed for chest pain.    . polyethylene glycol powder (GLYCOLAX/MIRALAX) powder Take 1 Container by mouth daily.     . potassium chloride SA (K-DUR) 20 MEQ tablet Take 20 mEq by mouth 2 (two) times daily.    . pravastatin (PRAVACHOL) 20 MG tablet Take 20 mg by mouth daily.    . tamsulosin (FLOMAX) 0.4 MG CAPS capsule Take 0.4 mg by mouth daily.    Marland Kitchen torsemide (DEMADEX) 20 MG tablet Take 2 tablets (40 mg total) by mouth 2 (two) times daily.     No current facility-administered medications for this visit.     Allergies:   Patient has no known  allergies.    Social History:  The patient  reports that he quit smoking about 43 years ago. His smoking use included cigarettes. He has a 30.00 pack-year smoking history. He quit smokeless tobacco use about 3 years ago.  His smokeless tobacco use included chew. He reports that he does not drink alcohol or use drugs.   Family History:  The patient's family history includes Heart attack in his father; Heart disease in his brother; Hypertension in his mother; Kidney cancer in his father; Prostate cancer in his brother; Skin cancer in his brother; Stroke in his mother.  ROS:  Please see the history of present illness.   Otherwise, review of systems are positive for decreased swelling.   All other systems are reviewed and negative.    PHYSICAL EXAM: VS:  BP (!) 142/82   Pulse 71   Ht '5\' 10"'  (1.778 m)   Wt 278 lb 12.8 oz (126.5 kg)   SpO2 100%   BMI 40.00 kg/m  , BMI Body mass index is 40 kg/m. GEN: Well nourished, well developed, in no acute distress  HEENT: normal  Neck: no JVD, carotid bruits, or masses Cardiac: RRR; no murmurs, rubs, or gallops,; mild LE edema  Respiratory:  clear to auscultation bilaterally, normal work of breathing GI: soft, nontender, nondistended, + BS MS: no deformity or atrophy  Skin: warm and dry, no rash Neuro:  Strength and sensation are intact Psych: euthymic mood, full affect   EKG:   The ekg ordered 12/19 demonstrates paced rhythm   Recent Labs: 09/11/2018: NT-Pro BNP 2,599 10/06/2018: ALT 14; B Natriuretic Peptide 203.0; Magnesium 2.4; TSH 7.828 11/11/2018: BUN 31; Creatinine, Ser 1.32; Hemoglobin 13.1; Platelets 182; Potassium 4.7; Sodium 138   Lipid Panel    Component Value Date/Time   CHOL 113 06/01/2014 0930   TRIG 115.0 06/01/2014 0930   HDL 32.40 (L) 06/01/2014 0930   CHOLHDL 3 06/01/2014 0930   VLDL 23.0 06/01/2014 0930   LDLCALC 58 06/01/2014 0930   LDLDIRECT 70.7 06/10/2013 1344     Other studies Reviewed: Additional studies/  records that were reviewed today with results demonstrating: prior records and labs reviewed.   ASSESSMENT AND PLAN:  1. CAD: No angina.  COntrinue aggressive secondary prevention. 2. Acute on chronic systolic heart failure: Prompted by being out of medicines.  He has had a good diuresis with metolazone recently. He has chronic orthopnea, but feels that breathing is much better since the metolazone.  Decrease torsemide to 40 mg in AM and 20 mg in afternoon. 3. AFib: Paced rhythm noted in the past with underlying AFib. Eliquis for stroke prevention.  Nuisance bleeding.  4. CKD: Stage 3 CKD.  Checking electrolytes today.  5. COVID precautions explained as well including distancing.   Current medicines are reviewed at length with the patient today.  The patient concerns regarding his medicines were addressed.  The following changes have been made: Decrease torsemide to 40 mg in the morning 20 mg in the afternoon  Labs/ tests ordered today include: Be met today No orders of the defined types were placed in this encounter.   Recommend 150 minutes/week of aerobic exercise Low fat, low carb, high fiber diet recommended  Disposition:   FU in 1 to 2 weeks with Richardson Dopp, virtual visit   Signed, Larae Grooms, MD  12/18/2018 9:20 AM    [facility] [address], [place], [place]  [address] Phone: [phone]; Fax: [phone]

## 2018-12-18 NOTE — Patient Instructions (Addendum)
Medication Instructions:  Your physician has recommended you make the following change in your medication:   1. DECREASE: torsemide to 2 tablets (40 mg) in the morning and 1 tablet (20 mg) in the afternoon  2. TAKE: potassium (k-dur) 1 tablet (69mEq) twice a day  Lab work: None Ordered  If you have labs (blood work) drawn today and your tests are completely normal, you will receive your results only by: Marland Kitchen MyChart Message (if you have MyChart) OR . A paper copy in the mail If you have any lab test that is abnormal or we need to change your treatment, we will call you to review the results.  Testing/Procedures: None ordered  Follow-Up: . Follow up with a VIDEO Visit with Richardson Dopp, PA on 01/01/19 at 11:45 AM  Any Other Special Instructions Will Be Listed Below (If Applicable).

## 2018-12-19 ENCOUNTER — Telehealth: Payer: Self-pay | Admitting: Cardiology

## 2018-12-19 NOTE — Telephone Encounter (Signed)
Patient's daughter called stating that patient fell 2 times today and was able to get up and feels fine but just wanted to talk with a doctor.  She states that he is weak.  His BP is 135/1mmHg and then 124/48mmHg with HR 70. He saw Dr. Irish Lack yesterday after running out of Torsemide for 2 weeds.  He was up 20lbs and Torsemide was increased.  He talked with Richardson Dopp and was given metolazone.  He last lost 20lbs over the past week.    Dr. Irish Lack yesterday  told him to not take any more metolazone and to decrease torsemide to 40mg  in am and 20mg  in PM.  Creatinine checked yesterday was 1.78.  He currently weighs 272lbs which is what he weighed this am and his baseline is 270-272lbs.  I have instructed him to hold further diuretics today and then tomorrow Torsemide to 20mg  BID and needs BMET on Monday

## 2018-12-21 ENCOUNTER — Other Ambulatory Visit: Payer: Self-pay

## 2018-12-21 ENCOUNTER — Telehealth: Payer: Self-pay | Admitting: Interventional Cardiology

## 2018-12-21 ENCOUNTER — Other Ambulatory Visit: Payer: Medicare Other | Admitting: *Deleted

## 2018-12-21 DIAGNOSIS — R7989 Other specified abnormal findings of blood chemistry: Secondary | ICD-10-CM | POA: Diagnosis not present

## 2018-12-21 LAB — BASIC METABOLIC PANEL
BUN/Creatinine Ratio: 27 — ABNORMAL HIGH (ref 10–24)
BUN: 37 mg/dL — ABNORMAL HIGH (ref 8–27)
CO2: 28 mmol/L (ref 20–29)
Calcium: 9.3 mg/dL (ref 8.6–10.2)
Chloride: 93 mmol/L — ABNORMAL LOW (ref 96–106)
Creatinine, Ser: 1.38 mg/dL — ABNORMAL HIGH (ref 0.76–1.27)
GFR calc Af Amer: 53 mL/min/{1.73_m2} — ABNORMAL LOW (ref >59)
GFR calc non Af Amer: 46 mL/min/{1.73_m2} — ABNORMAL LOW (ref >59)
Glucose: 117 mg/dL — ABNORMAL HIGH (ref 65–99)
Potassium: 4 mmol/L (ref 3.5–5.2)
Sodium: 139 mmol/L (ref 134–144)

## 2018-12-21 MED ORDER — TORSEMIDE 20 MG PO TABS: 20.0000 mg | ORAL_TABLET | Freq: Two times a day (BID) | ORAL | 3 refills | Status: DC

## 2018-12-21 NOTE — Telephone Encounter (Signed)
I didn't see the note from Dr. Radford Pax. Continue Torsemide 20 mg twice daily. Will see what BMET from today shows before getting any further labs. Richardson Dopp, PA-C    12/21/2018 1:47 PM

## 2018-12-21 NOTE — Telephone Encounter (Signed)
BMET ordered for 4/20 and med list updated. Spoke with daughter (DPR on file) see phone note from today.

## 2018-12-21 NOTE — Telephone Encounter (Signed)
New Message:    Pt's daughter talked to the doctor on call Saturday. Pt's medicine was adjusted. She wants to know if pt was supposed to have a visit today or just  Have lab work?

## 2018-12-21 NOTE — Telephone Encounter (Signed)
Called and spoke to patient's daughter (DPR on file). Made her aware that patient just needs lab appointment. Med list updated with torsemide 20 mg BID and BMET was ordered per Dr. Radford Pax (see phone note from 4/18. Daughter verbalized understanding and thanked me for the call.

## 2018-12-22 ENCOUNTER — Encounter: Payer: Medicare Other | Admitting: Internal Medicine

## 2018-12-22 ENCOUNTER — Encounter

## 2018-12-23 NOTE — Progress Notes (Signed)
Remote pacemaker transmission.   

## 2018-12-30 ENCOUNTER — Other Ambulatory Visit: Payer: Self-pay | Admitting: Physician Assistant

## 2018-12-31 NOTE — Progress Notes (Signed)
Virtual Visit via Telephone Note   This visit type was conducted due to national recommendations for restrictions regarding the COVID-19 Pandemic (e.g. social distancing) in an effort to limit this patient's exposure and mitigate transmission in our community.  Due to his co-morbid illnesses, this patient is at least at moderate risk for complications without adequate follow up.  This format is felt to be most appropriate for this patient at this time.  The patient did not have access to video technology/had technical difficulties with video requiring transitioning to audio format only (telephone).  All issues noted in this document were discussed and addressed.  No physical exam could be performed with this format.  Please refer to the patient's chart for his  consent to telehealth for [facility].   Date:  01/01/2019   ID:  Levi [name], DOB [birth year], MRN [id number]  Patient Location: Home Provider Location: Home  PCP:  Bernerd Limbo, MD  Cardiologist:  [name], MD   Electrophysiologist:  Cristopher Peru, MD   Evaluation Performed:  Follow-Up Visit  Chief Complaint: Follow-up on CHF  History of Present Illness:    Levi [name] is a 85 y.o. male with coronary artery disease status post CABG in 2011 and drug-eluting stent x2 to the LAD in 2013, paroxysmal atrial fibrillation, symptomatic bradycardia with Mobitz 2 status post pacemaker implantation in January 0923,[id number] systolic anddiastolic heart failure, hypertension, hyperlipidemia.Cardiac Catheterization at [facility] June 2018 demonstrated patent LIMA-LAD and diffuse disease elsewhere.  All vein grafts were known to be occluded. There were no targets for PCIandmedical therapy was recommended. He is on Apixaban for anticoagulation.EF was previously normal. Echocardiogram in December 2019 demonstrated EF 40-45%. This was reviewed with Dr. Harrington Challenger and continued medical therapy was recommended.  He was admitted in  February 2020 with decompensated heart failure.  Discharge weight at that time was 273 pounds.    CHADS2-VASc=5 (age x 2, CHF, CAD, HTN).  He was seen 12/16/2018 via Telemedicine visit.  He was significantly volume overloaded.  I adjusted his diuretics.  He followed up a few days later in person in the clinic and saw Dr. Irish Lack.  He was much improved at that point and his diuretic dose was reduced back to his usual regimen.  Today, he notes he is fairly stable.  He has chronic dyspnea with exertion that is unchanged.  His lower extremity swelling has improved.  His weight has remained stable at around 280 pounds.  He has been taking 2 torsemide in the morning and one torsemide in the afternoon.  He sleeps with CPAP.  He has not had PND, chest discomfort or syncope.  The patient does not have symptoms concerning for COVID-19 infection (fever, chills, cough, or new shortness of breath).    Past Medical History:  Diagnosis Date   Asthma    Benign neoplasm of colon    CAD (coronary artery disease) Dec 2011   s/p CABG per Dr. Roxy Manns; had normal EF; All SVGs occluded per follow up cath with only LIMA to LAD patent; s/p PCI of the proximal and mid LAD February 2014 per Dr. Burt Knack   CHF (congestive heart failure) ([place])    Dyspnea    Dyspnea on exertion    Edema    Emphysema    "never treated for it" (10/05/2015)   History of hiatal hernia    HLD (hyperlipidemia)    HTN (hypertension)    Malaise and fatigue    Obesity    Osteopenia  Pneumonia 1960s X 1; 1990s X 1   Presence of permanent cardiac pacemaker    Prostate cancer ([place])    Skin cancer    "face, nose, top of ears, scalp"   Symptomatic bradycardia    STJ PPM, 10/05/15, Dr. Lovena Le   Past Surgical History:  Procedure Laterality Date   ANTERIOR CERVICAL DECOMP/DISCECTOMY FUSION     BACK SURGERY     Liberal EXTRACTION Right    CORONARY ANGIOPLASTY WITH STENT PLACEMENT  10/14/12    with stent to proximal and mid LAD   CORONARY ARTERY BYPASS GRAFT  08/23/10   "CABG X4"   EP IMPLANTABLE DEVICE N/A 10/05/2015   Procedure: Pacemaker Implant;  Surgeon: [name], MD;  Location: [place] [facility];  Service: Cardiovascular;  Laterality: N/A;   EP IMPLANTABLE DEVICE N/A 10/06/2015   Procedure: Lead Revision/Repair;  Surgeon: [name], MD;  Location: [place] [facility];  Service: Cardiovascular;  Laterality: N/A;   EYE SURGERY     INSERT / REPLACE / REMOVE PACEMAKER  10/05/2015   LEFT HEART CATHETERIZATION WITH CORONARY/GRAFT ANGIOGRAM N/A 09/01/2012   Procedure: LEFT HEART CATHETERIZATION WITH [name];  Surgeon: [name], MD;  Location: [facility] CATH LAB;  Service: Cardiovascular;  Laterality: N/A;   MEDIAN STERNOTOMY  08/23/10   MOHS SURGERY  "@ least twice"   PACEMAKER INSERTION     STJ 10/05/15   PERCUTANEOUS CORONARY STENT INTERVENTION (PCI-S) N/A 10/14/2012   Procedure: PERCUTANEOUS CORONARY STENT INTERVENTION (PCI-S);  Surgeon: [name], MD;  Location: [facility] CATH LAB;  Service: Cardiovascular;  Laterality: N/A;   PROSTATE BIOPSY     TRANSURETHRAL RESECTION OF PROSTATE       Current Meds  Medication Sig   apixaban (ELIQUIS) 5 MG TABS tablet Take 1 tablet (5 mg total) by mouth 2 (two) times daily.   carvedilol (COREG) 6.25 MG tablet TAKE 1 TABLET BY MOUTH TWO  TIMES DAILY   Melatonin 5 MG TABS Take 5 mg by mouth as needed (sleep).    nitroGLYCERIN (NITROSTAT) 0.4 MG SL tablet Place 0.4 mg under the tongue every 5 (five) minutes x 3 doses as needed for chest pain.   polyethylene glycol powder (GLYCOLAX/MIRALAX) powder Take 1 Container by mouth daily.    potassium chloride SA (K-DUR) 20 MEQ tablet Take 20 mEq by mouth 2 (two) times daily.   pravastatin (PRAVACHOL) 20 MG tablet Take 20 mg by mouth daily.   tamsulosin (FLOMAX) 0.4 MG CAPS capsule Take 0.4 mg by mouth daily.   torsemide (DEMADEX) 20 MG tablet Patient take  two tablets by mouth in the AM and one tablet by mouth in the PM     Allergies:   Patient has no known allergies.   Social History   Tobacco Use   Smoking status: Former Smoker    Packs/day: 1.00    Years: 30.00    Pack years: 30.00    Types: Cigarettes    Last attempt to quit: 09/07/1975    Years since quitting: 43.3   Smokeless tobacco: Former Systems developer    Types: Chew    Quit date: 07/27/2015  Substance Use Topics   Alcohol use: No    Alcohol/week: 0.0 standard drinks   Drug use: No     Family Hx: The patient's family history includes Heart attack in his father; Heart disease in his brother; Hypertension in his mother; Kidney cancer in his father; Prostate cancer in his  brother; Skin cancer in his brother; Stroke in his mother. There is no history of Allergic rhinitis, Asthma, Eczema, or Urticaria.  ROS:   Please see the history of present illness.    All other systems reviewed and are negative.   Prior CV studies:   The following studies were reviewed today:  Echo 08/28/18 Mild conc LVH, EF 40-45, inf/inf-sept/apical HK, Gr 2 DD, MAC, normal RVSF, mod RAE, increased RA pressure, mild TR  Cardiac catheterization 02/10/2017 ([facility]) Coronary angiography reveals 3-vessel CAD.  LM is diffusely diseased with 50% distal stenosis.  LAD has a 80% mid instent restenosis,  LCX has serial 50-60% stenoses in the mid segment, OM2 is a small vessel and has a 70% ostial stenosis.  RCA has long diffuse 50-60% proximal and 60% distal stenosis.  LIMA to LAD is patent with a kink in the mid segment. Flow is normal.  LV-Gram reveals EF 50%. LVEDP is 8 mmHg.  SVG grafts are known to be chronically occluded, so not injected   Nuclear stress test 01/21/2017 ([facility]) 1. Moderate to severe inferoapical inducible ischemia with regadenoson 2. Mild to moderate mid inferior and posterior basal defect. Cannot rule out prior infarct. 3.  Mild to moderate hypokinesis of the inferior wall. Dyskinesia/akinesia of the apex. 4. Moderately decreased left ventricular ejection fraction at 43%. 5. Please correlate with a cardiac ultrasound  Echo 06/20/2016 EF 68-11, grade 1 diastolic dysfunction, MAC, moderate LAE  Echo 09/28/2015 Mild concentric LVH, EF 55-60, normal wall motion, MAC, mild MR, severe LAE, normal RVSF, mild RAE, mild TR, PASP 41, GLS -14.8%  Exercise tolerance test 09/27/2015 Poor exercise capacity.  Test stopped early as HR 58 >> 33 and patient was weak. ECGs demonstrate transient complete heart block.  Dr. Cristopher Peru saw patient and discussed proceeding with pacemaker implantation. Echo will be arranged.  Carotid US 09/14/2015 Bilateral ICA 1-39; partial left subclavian steal>>follow-up as needed  Nuclear stress test 11/29/2014 Low risk stress nuclear study with a moderate size, medium intensity, fixed inferior/apical defect consistent with prior infarct; no significant ischemia. LV Ejection Fraction: 56%.  Echo (10/15):  Mild LVH, EF 55-60%, no RWMA, mod LAE, mild TR, PASP 42 mmHg  Nuclear (4/15):  Low risk stress nuclear study with a fixed defect in the apical anterior, inferior walls and in the true apex consistent with distal LAD scar. No ischemia. EF 56% LHC (12/13):  Dist LM 60-70%, ostial LAD 40-50%, D1 occluded, prox LAD 80% then aneurysmal segment then 80%, ostial septal perforator 99%, prox CFX 50%, OM branch serial 60% lesions, prox RCA 30%, PL Br 50%; LIMA-LAD patent, SVG-LAD occluded, SVG-OM occluded, SVG-PDA occluded >>Med Rx >>continued angina >> PCI (2/14): 2.25 x 32 mm Promus DES and 2.5 x 20 mm Promus DES to prox and mid LAD  Labs/Other Tests and Data Reviewed:    EKG:  No ECG reviewed.  Recent Labs: 09/11/2018: NT-Pro BNP 2,599 10/06/2018: ALT 14; B Natriuretic Peptide 203.0; Magnesium 2.4; TSH 7.828 11/11/2018: Hemoglobin 13.1; Platelets 182 12/21/2018: BUN 37;  Creatinine, Ser 1.38; Potassium 4.0; Sodium 139   Recent Lipid Panel Lab Results  Component Value Date/Time   CHOL 113 06/01/2014 09:30 AM   TRIG 115.0 06/01/2014 09:30 AM   HDL 32.40 (L) 06/01/2014 09:30 AM   CHOLHDL 3 06/01/2014 09:30 AM   LDLCALC 58 06/01/2014 09:30 AM   LDLDIRECT 70.7 06/10/2013 01:44 PM    Wt Readings from Last 3 Encounters:  01/01/19  280 lb (127 kg)  12/18/18 278 lb 12.8 oz (126.5 kg)  12/16/18 298 lb (135.2 kg)     Objective:    Vital Signs:  BP 108/74    Pulse 86    Ht _0  (1.778 m)    Wt 280 lb (127 kg)    BMI 40.18 kg/m    VITAL SIGNS:  reviewed GEN:  no acute distress RESPIRATORY:  No labored breathing noted during our conversation NEURO:  Alert and oriented PSYCH:  He seems to be in good spirits  ASSESSMENT & PLAN:    Chronic combined systolic and diastolic CHF (congestive heart failure) ([place]) EF by echo in December 2019 40-45 with moderate diastolic dysfunction.  His volume has remained stable since taking a dose of metolazone 2 weeks ago.  He has been managing his weight with 2 torsemide in the morning and 1 torsemide in the afternoon.  I recommend he continue on his current medical regimen.  He should contact us if he gains more than 2 pounds in 1 day.  I will arrange a home visit in 1-2 weeks to obtain a follow-up BMET.  He should keep his appointment with me in August as planned.  CKD (chronic kidney disease) stage 2, GFR 60-89 ml/min Recent creatinine improved.  As noted, I will obtain a follow-up BMET in the next 1-2 weeks via home visit.  PAF (paroxysmal atrial fibrillation) ([place]) He is tolerating his current dose of Apixaban  Coronary artery disease involving native coronary artery of native heart with angina pectoris ([place]) History of CABG in 2011 and drug-eluting stent x2 to the LAD in 2013. [facility] a [facility] LIMA-LAD and diffuse disease elsewhere. All vein grafts were known to be  occluded. There were no targets for PCI. Medical therapy was recommended.    He is not having any angina.  Continue statin, beta-blocker.  Essential hypertension The patient's blood pressure is controlled on his current regimen.  Continue current therapy.    Time:   Today, I have spent 16 minutes with the patient with telehealth technology discussing the above problems.     Medication Adjustments/Labs and Tests Ordered: Current medicines are reviewed at length with the patient today.  Concerns regarding medicines are outlined above.   Tests Ordered: Orders Placed This Encounter  Procedures   Basic metabolic panel    Medication Changes: No orders of the defined types were placed in this encounter.   Disposition:  Follow up in 3 month(s)  Signed, Richardson Dopp, PA-C  01/01/2019 1:43 PM    [facility]

## 2019-01-01 ENCOUNTER — Other Ambulatory Visit: Payer: Self-pay

## 2019-01-01 ENCOUNTER — Telehealth (INDEPENDENT_AMBULATORY_CARE_PROVIDER_SITE_OTHER): Payer: Medicare Other | Admitting: Physician Assistant

## 2019-01-01 ENCOUNTER — Telehealth: Payer: Self-pay | Admitting: Physician Assistant

## 2019-01-01 ENCOUNTER — Telehealth: Payer: Medicare Other | Admitting: Physician Assistant

## 2019-01-01 ENCOUNTER — Encounter: Payer: Self-pay | Admitting: Physician Assistant

## 2019-01-01 VITALS — BP 108/74 | HR 86 | Ht 70.0 in | Wt 280.0 lb

## 2019-01-01 DIAGNOSIS — N182 Chronic kidney disease, stage 2 (mild): Secondary | ICD-10-CM

## 2019-01-01 DIAGNOSIS — I25119 Atherosclerotic heart disease of native coronary artery with unspecified angina pectoris: Secondary | ICD-10-CM

## 2019-01-01 DIAGNOSIS — I5042 Chronic combined systolic (congestive) and diastolic (congestive) heart failure: Secondary | ICD-10-CM | POA: Diagnosis not present

## 2019-01-01 DIAGNOSIS — I480 Paroxysmal atrial fibrillation: Secondary | ICD-10-CM

## 2019-01-01 DIAGNOSIS — I10 Essential (primary) hypertension: Secondary | ICD-10-CM

## 2019-01-01 NOTE — Patient Instructions (Signed)
Medication Instructions:  No changes  If you need a refill on your cardiac medications before your next appointment, please call your pharmacy.   Lab work: I will arrange a home visit in 1-2 weeks to draw labs: BMET  If you have labs (blood work) drawn today and your tests are completely normal, you will receive your results only by: Marland Kitchen MyChart Message (if you have MyChart) OR . A paper copy in the mail If you have any lab test that is abnormal or we need to change your treatment, we will call you to review the results.  Testing/Procedures: None  Follow-Up: At [facility], you and your health needs are our priority.  As part of our continuing mission to provide you with exceptional heart care, we have created designated Provider Care Teams.  These Care Teams include your primary Cardiologist (physician) and Advanced Practice Providers (APPs -  Physician Assistants and Nurse Practitioners) who all work together to provide you with the care you need, when you need it. Richardson Dopp, PA-C in August as planned  Any Other Special Instructions Will Be Listed Below (If Applicable).  Call if weight goes up more than 2 pounds in 24 hours

## 2019-01-01 NOTE — Telephone Encounter (Signed)
[place] Visit Request  Agency Requested:  [facility] [employer] Contact:  Darnell Level, NP Phone #:  [phone] Fax #:  [phone]  Patient Information: Name:  Levi [name]  DOB:  02-14-1929  MRN:  [id number]  Address:   [address] [place] [address]  Phone Numbers:   Home Phone [phone]  Mobile [phone]     Requesting Provider:   Richardson Dopp, PA-C   Diagnosis:   CHF, CKD  Reason for Visit:   Obtain follow up labs  Services Requested:  Vital Signs (BP, Pulse, O2, Weight)  Physical Exam  Labs:  BMET  # of Visits Needed/Frequency per Week: 1 visit in 1-2 weeks to draw follow up BMET.  I recently had him take Metolazone to augment diuresis.  His baseline weight is 280.  He has been maintaining this since. Please check his edema, weigh him and get VS and draw a BMET.

## 2019-01-07 DIAGNOSIS — J301 Allergic rhinitis due to pollen: Secondary | ICD-10-CM | POA: Diagnosis not present

## 2019-01-08 DIAGNOSIS — J301 Allergic rhinitis due to pollen: Secondary | ICD-10-CM | POA: Diagnosis not present

## 2019-01-11 DIAGNOSIS — N182 Chronic kidney disease, stage 2 (mild): Secondary | ICD-10-CM | POA: Diagnosis not present

## 2019-01-11 DIAGNOSIS — I5042 Chronic combined systolic (congestive) and diastolic (congestive) heart failure: Secondary | ICD-10-CM | POA: Diagnosis not present

## 2019-01-11 NOTE — Telephone Encounter (Signed)
Please contact Glory Buff, NP at Phone #:  [phone] to make sure she got this request.  He should be getting labs this week (BMET). Thanks Richardson Dopp, PA-C    01/11/2019 5:36 PM

## 2019-01-12 LAB — BASIC METABOLIC PANEL
BUN/Creatinine Ratio: 23 (ref 10–24)
BUN: 29 mg/dL — ABNORMAL HIGH (ref 8–27)
CO2: 27 mmol/L (ref 20–29)
Calcium: 9.3 mg/dL (ref 8.6–10.2)
Chloride: 99 mmol/L (ref 96–106)
Creatinine, Ser: 1.25 mg/dL (ref 0.76–1.27)
GFR calc Af Amer: 59 mL/min/{1.73_m2} — ABNORMAL LOW (ref >59)
GFR calc non Af Amer: 51 mL/min/{1.73_m2} — ABNORMAL LOW (ref >59)
Glucose: 90 mg/dL (ref 65–99)
Potassium: 4.9 mmol/L (ref 3.5–5.2)
Sodium: 140 mmol/L (ref 134–144)

## 2019-01-12 NOTE — Telephone Encounter (Signed)
SPOKE WITH NURSE ABOUT REPEAT BMET  AND NEEDS A NEW REQUEST TO REDRAW

## 2019-01-12 NOTE — Telephone Encounter (Signed)
SPOKE WITH NURSE ABOUT REPEAT BMETS  AND PT NEED A NEW REQUEST TO REDRAW EACH TIME.   ALL PT LABS THAT WAS NEEDED ARE DRAWN [facility] AND [place]   AND NEW REQUEST REFAXED OVER FOR ANOTHER PT DOCKERY

## 2019-01-13 ENCOUNTER — Telehealth: Payer: Self-pay | Admitting: *Deleted

## 2019-01-13 NOTE — Progress Notes (Addendum)
Notes reviewed. Weight is up to 286 lbs according to recent home visit. Labs from yesterday reviewed.   Please call patient and confirm what his weight is today.  If his weight is > 283 lbs, he should take an extra Torsemide this afternoon.   He should call if his weight dose not come down lower than 283 lbs.  Richardson Dopp, PA-C    01/13/2019 1:14 PM

## 2019-01-13 NOTE — Telephone Encounter (Signed)
CALLED PT NO ANSWER AT NUMBER   SPOKE WITH PT DTR ABOUT LABS WHICH WAS CHECKED ALREADY IN Epic.AND WEIGHT   PT DTR ATTEMPTED TO CALL PT ANOTHER WAY NO LUCK.  PT DTR SAYS TO WILL CALL CLINIC BACK IN AM WITH WEIGHT. ITLL  BE 6 OCLK  BEFORE  SHE COULD CALL BACK

## 2019-01-14 NOTE — Telephone Encounter (Signed)
Follow up   Per daughter the patient's weight is 281 lbs. Patient's daughter states that his weight is stable.

## 2019-01-14 NOTE — Telephone Encounter (Signed)
Ok.  Continue current medications and follow up as planned.  Richardson Dopp, PA-C    01/14/2019 5:29 PM

## 2019-01-24 ENCOUNTER — Other Ambulatory Visit: Payer: Self-pay | Admitting: Internal Medicine

## 2019-02-16 ENCOUNTER — Ambulatory Visit: Payer: Medicare Other | Admitting: Physician Assistant

## 2019-02-23 DIAGNOSIS — L570 Actinic keratosis: Secondary | ICD-10-CM | POA: Diagnosis not present

## 2019-02-23 DIAGNOSIS — D485 Neoplasm of uncertain behavior of skin: Secondary | ICD-10-CM | POA: Diagnosis not present

## 2019-02-23 DIAGNOSIS — L821 Other seborrheic keratosis: Secondary | ICD-10-CM | POA: Diagnosis not present

## 2019-02-23 DIAGNOSIS — D1801 Hemangioma of skin and subcutaneous tissue: Secondary | ICD-10-CM | POA: Diagnosis not present

## 2019-02-23 DIAGNOSIS — C44311 Basal cell carcinoma of skin of nose: Secondary | ICD-10-CM | POA: Diagnosis not present

## 2019-02-23 DIAGNOSIS — M7981 Nontraumatic hematoma of soft tissue: Secondary | ICD-10-CM | POA: Diagnosis not present

## 2019-02-23 DIAGNOSIS — D044 Carcinoma in situ of skin of scalp and neck: Secondary | ICD-10-CM | POA: Diagnosis not present

## 2019-02-23 DIAGNOSIS — Z85828 Personal history of other malignant neoplasm of skin: Secondary | ICD-10-CM | POA: Diagnosis not present

## 2019-03-03 ENCOUNTER — Other Ambulatory Visit: Payer: Self-pay | Admitting: Physician Assistant

## 2019-03-04 DIAGNOSIS — M25561 Pain in right knee: Secondary | ICD-10-CM | POA: Diagnosis not present

## 2019-03-08 DIAGNOSIS — M1711 Unilateral primary osteoarthritis, right knee: Secondary | ICD-10-CM | POA: Diagnosis not present

## 2019-03-08 DIAGNOSIS — N183 Chronic kidney disease, stage 3 (moderate): Secondary | ICD-10-CM | POA: Diagnosis not present

## 2019-03-11 ENCOUNTER — Telehealth: Payer: Self-pay | Admitting: Internal Medicine

## 2019-03-11 NOTE — Telephone Encounter (Signed)
New Message   Pt c/o BP issue:  1. What are your last 5 BP readings? 109/70, 112/68, 117/65 2. Are you having any other symptoms (ex. Dizziness, headache, blurred vision, passed out)? dizziness 3. What is your medication issue? None   Patient is having a hard time standing up as well. Please call to discuss.

## 2019-03-11 NOTE — Telephone Encounter (Signed)
LMTCB

## 2019-03-15 ENCOUNTER — Encounter: Payer: Self-pay | Admitting: Physician Assistant

## 2019-03-15 ENCOUNTER — Inpatient Hospital Stay (HOSPITAL_COMMUNITY)
Admission: AD | Admit: 2019-03-15 | Discharge: 2019-03-17 | DRG: 291 | Disposition: A | Discharge status: Home / Self Care | Payer: Medicare Other | Source: Ambulatory Visit | Attending: Internal Medicine | Admitting: Internal Medicine

## 2019-03-15 ENCOUNTER — Ambulatory Visit (INDEPENDENT_AMBULATORY_CARE_PROVIDER_SITE_OTHER): Payer: Medicare Other | Admitting: Physician Assistant

## 2019-03-15 ENCOUNTER — Telehealth: Payer: Self-pay

## 2019-03-15 ENCOUNTER — Other Ambulatory Visit: Payer: Self-pay

## 2019-03-15 VITALS — BP 118/72 | HR 70 | Ht 70.0 in | Wt 288.0 lb

## 2019-03-15 DIAGNOSIS — Z6841 Body Mass Index (BMI) 40.0 and over, adult: Secondary | ICD-10-CM

## 2019-03-15 DIAGNOSIS — N182 Chronic kidney disease, stage 2 (mild): Secondary | ICD-10-CM | POA: Diagnosis not present

## 2019-03-15 DIAGNOSIS — G4733 Obstructive sleep apnea (adult) (pediatric): Secondary | ICD-10-CM | POA: Diagnosis present

## 2019-03-15 DIAGNOSIS — Z950 Presence of cardiac pacemaker: Secondary | ICD-10-CM | POA: Diagnosis not present

## 2019-03-15 DIAGNOSIS — E785 Hyperlipidemia, unspecified: Secondary | ICD-10-CM | POA: Diagnosis present

## 2019-03-15 DIAGNOSIS — I5042 Chronic combined systolic (congestive) and diastolic (congestive) heart failure: Secondary | ICD-10-CM | POA: Diagnosis not present

## 2019-03-15 DIAGNOSIS — Z951 Presence of aortocoronary bypass graft: Secondary | ICD-10-CM

## 2019-03-15 DIAGNOSIS — I5043 Acute on chronic combined systolic (congestive) and diastolic (congestive) heart failure: Secondary | ICD-10-CM | POA: Diagnosis present

## 2019-03-15 DIAGNOSIS — Z7901 Long term (current) use of anticoagulants: Secondary | ICD-10-CM

## 2019-03-15 DIAGNOSIS — Z1159 Encounter for screening for other viral diseases: Secondary | ICD-10-CM

## 2019-03-15 DIAGNOSIS — I2510 Atherosclerotic heart disease of native coronary artery without angina pectoris: Secondary | ICD-10-CM | POA: Diagnosis present

## 2019-03-15 DIAGNOSIS — R42 Dizziness and giddiness: Secondary | ICD-10-CM

## 2019-03-15 DIAGNOSIS — I25810 Atherosclerosis of coronary artery bypass graft(s) without angina pectoris: Secondary | ICD-10-CM | POA: Diagnosis not present

## 2019-03-15 DIAGNOSIS — I10 Essential (primary) hypertension: Secondary | ICD-10-CM

## 2019-03-15 DIAGNOSIS — Z8249 Family history of ischemic heart disease and other diseases of the circulatory system: Secondary | ICD-10-CM

## 2019-03-15 DIAGNOSIS — I130 Hypertensive heart and chronic kidney disease with heart failure and stage 1 through stage 4 chronic kidney disease, or unspecified chronic kidney disease: Principal | ICD-10-CM | POA: Diagnosis present

## 2019-03-15 DIAGNOSIS — Z87891 Personal history of nicotine dependence: Secondary | ICD-10-CM

## 2019-03-15 DIAGNOSIS — I480 Paroxysmal atrial fibrillation: Secondary | ICD-10-CM

## 2019-03-15 DIAGNOSIS — I255 Ischemic cardiomyopathy: Secondary | ICD-10-CM

## 2019-03-15 DIAGNOSIS — N179 Acute kidney failure, unspecified: Secondary | ICD-10-CM | POA: Diagnosis present

## 2019-03-15 DIAGNOSIS — Z981 Arthrodesis status: Secondary | ICD-10-CM

## 2019-03-15 DIAGNOSIS — Z955 Presence of coronary angioplasty implant and graft: Secondary | ICD-10-CM

## 2019-03-15 DIAGNOSIS — N1832 Chronic kidney disease, stage 3b: Secondary | ICD-10-CM | POA: Diagnosis present

## 2019-03-15 DIAGNOSIS — Z823 Family history of stroke: Secondary | ICD-10-CM

## 2019-03-15 HISTORY — DX: Paroxysmal atrial fibrillation: I48.0

## 2019-03-15 HISTORY — DX: Chronic combined systolic (congestive) and diastolic (congestive) heart failure: I50.42

## 2019-03-15 HISTORY — DX: Chronic kidney disease, stage 2 (mild): N18.2

## 2019-03-15 LAB — CBC WITH DIFFERENTIAL/PLATELET
Abs Immature Granulocytes: 0.05 10*3/uL (ref 0.00–0.07)
Basophils Absolute: 0.1 10*3/uL (ref 0.0–0.1)
Basophils Relative: 1 %
Eosinophils Absolute: 0.4 10*3/uL (ref 0.0–0.5)
Eosinophils Relative: 4 %
HCT: 40.1 % (ref 39.0–52.0)
Hemoglobin: 13.2 g/dL (ref 13.0–17.0)
Immature Granulocytes: 1 %
Lymphocytes Relative: 15 %
Lymphs Abs: 1.4 10*3/uL (ref 0.7–4.0)
MCH: 31.5 pg (ref 26.0–34.0)
MCHC: 32.9 g/dL (ref 30.0–36.0)
MCV: 95.7 fL (ref 80.0–100.0)
Monocytes Absolute: 0.9 10*3/uL (ref 0.1–1.0)
Monocytes Relative: 10 %
Neutro Abs: 6.3 10*3/uL (ref 1.7–7.7)
Neutrophils Relative %: 69 %
Platelets: 193 10*3/uL (ref 150–400)
RBC: 4.19 MIL/uL — ABNORMAL LOW (ref 4.22–5.81)
RDW: 15.4 % (ref 11.5–15.5)
WBC: 9.1 10*3/uL (ref 4.0–10.5)
nRBC: 0 % (ref 0.0–0.2)

## 2019-03-15 LAB — COMPREHENSIVE METABOLIC PANEL
ALT: 14 U/L (ref 0–44)
AST: 21 U/L (ref 15–41)
Albumin: 3.4 g/dL — ABNORMAL LOW (ref 3.5–5.0)
Alkaline Phosphatase: 90 U/L (ref 38–126)
Anion gap: 11 (ref 5–15)
BUN: 22 mg/dL (ref 8–23)
CO2: 27 mmol/L (ref 22–32)
Calcium: 8.8 mg/dL — ABNORMAL LOW (ref 8.9–10.3)
Chloride: 101 mmol/L (ref 98–111)
Creatinine, Ser: 1.22 mg/dL (ref 0.61–1.24)
GFR calc Af Amer: >60 mL/min (ref >60)
GFR calc non Af Amer: 53 mL/min — ABNORMAL LOW (ref >60)
Glucose, Bld: 104 mg/dL — ABNORMAL HIGH (ref 70–99)
Potassium: 3.7 mmol/L (ref 3.5–5.1)
Sodium: 139 mmol/L (ref 135–145)
Total Bilirubin: 0.6 mg/dL (ref 0.3–1.2)
Total Protein: 6.7 g/dL (ref 6.5–8.1)

## 2019-03-15 LAB — BRAIN NATRIURETIC PEPTIDE: B Natriuretic Peptide: 107 pg/mL — ABNORMAL HIGH (ref 0.0–100.0)

## 2019-03-15 LAB — SARS CORONAVIRUS 2 BY RT PCR (HOSPITAL ORDER, PERFORMED IN ~~LOC~~ HOSPITAL LAB): SARS Coronavirus 2: NEGATIVE

## 2019-03-15 MED ORDER — MELATONIN 3 MG PO TABS
6.0000 mg | ORAL_TABLET | ORAL | Status: DC | PRN
  Filled 2019-03-15: qty 2

## 2019-03-15 MED ORDER — NITROGLYCERIN 0.4 MG SL SUBL: 0.4000 mg | SUBLINGUAL_TABLET | SUBLINGUAL | Status: DC | PRN

## 2019-03-15 MED ORDER — ONDANSETRON HCL 4 MG/2ML IJ SOLN: 4.0000 mg | Freq: Four times a day (QID) | INTRAMUSCULAR | Status: DC | PRN

## 2019-03-15 MED ORDER — TAMSULOSIN HCL 0.4 MG PO CAPS
0.4000 mg | ORAL_CAPSULE | Freq: Every day | ORAL | Status: DC
  Administered 2019-03-16 – 2019-03-17 (×2): 0.4 mg via ORAL
  Filled 2019-03-15 (×2): qty 1

## 2019-03-15 MED ORDER — CARVEDILOL 6.25 MG PO TABS
6.2500 mg | ORAL_TABLET | Freq: Two times a day (BID) | ORAL | Status: DC
  Administered 2019-03-15 – 2019-03-17 (×4): 6.25 mg via ORAL
  Filled 2019-03-15 (×4): qty 1

## 2019-03-15 MED ORDER — FUROSEMIDE 10 MG/ML IJ SOLN
80.0000 mg | Freq: Two times a day (BID) | INTRAMUSCULAR | Status: DC
  Administered 2019-03-15 – 2019-03-17 (×4): 80 mg via INTRAVENOUS
  Filled 2019-03-15 (×4): qty 8

## 2019-03-15 MED ORDER — POTASSIUM CHLORIDE CRYS ER 20 MEQ PO TBCR
20.0000 meq | EXTENDED_RELEASE_TABLET | Freq: Two times a day (BID) | ORAL | Status: DC
  Administered 2019-03-15 – 2019-03-17 (×4): 20 meq via ORAL
  Filled 2019-03-15 (×4): qty 1

## 2019-03-15 MED ORDER — POLYETHYLENE GLYCOL 3350 17 GM/SCOOP PO POWD: 1.0000 | Freq: Every day | ORAL | Status: DC

## 2019-03-15 MED ORDER — ACETAMINOPHEN 325 MG PO TABS: 650.0000 mg | ORAL_TABLET | ORAL | Status: DC | PRN

## 2019-03-15 MED ORDER — PRAVASTATIN SODIUM 10 MG PO TABS
20.0000 mg | ORAL_TABLET | Freq: Every day | ORAL | Status: DC
  Administered 2019-03-16 – 2019-03-17 (×2): 20 mg via ORAL
  Filled 2019-03-15 (×2): qty 2

## 2019-03-15 MED ORDER — APIXABAN 5 MG PO TABS
5.0000 mg | ORAL_TABLET | Freq: Two times a day (BID) | ORAL | Status: DC
  Administered 2019-03-15 – 2019-03-17 (×4): 5 mg via ORAL
  Filled 2019-03-15 (×4): qty 1

## 2019-03-15 MED ORDER — POLYETHYLENE GLYCOL 3350 17 G PO PACK
17.0000 g | PACK | Freq: Every day | ORAL | Status: DC
  Administered 2019-03-16 – 2019-03-17 (×2): 17 g via ORAL
  Filled 2019-03-15 (×2): qty 1

## 2019-03-15 NOTE — Telephone Encounter (Signed)
I helped the pt send a transmission with his home monitor. Transmission received.

## 2019-03-15 NOTE — Progress Notes (Signed)
Admitted to3E7 at this time. Ambulated from bed to BR. Has no verbal complaints at this time

## 2019-03-15 NOTE — Telephone Encounter (Signed)
Pt called c/o feeling "swimmy headed"  When rising.. he feels better when he is sitting or laying down... he denies any new SOB other than his normal of being "short winded" all of the time... he does not have chest pain.   Pts daughter has sent an extensive list of his BP and HR..HR has been in the 70's but will see if pt can get a remote transmission of his device this morning.   He will see Robbie Lis PA today at 1pm and he will call his PCP Dr. Coletta Memos for an appt also.   Pt denies COVID SX and will wear a mask to his appt today.       COVID-19 Pre-Screening Questions:  . In the past 7 to 10 days have you had a cough,  shortness of breath, headache, congestion, fever (100 or greater) body aches, chills, sore throat, or sudden loss of taste or sense of smell? . Have you been around anyone with known Covid 19. NO . Have you been around anyone who is awaiting Covid 19 test results in the past 7 to 10 days? NO . Have you been around anyone who has been exposed to Covid 19, or has mentioned symptoms of Covid 19 within the past 7 to 10 days? NO  If you have any concerns/questions about symptoms patients report during screening (either on the phone or at threshold). Contact the provider seeing the patient or DOD for further guidance.  If neither are available contact a member of the leadership team.

## 2019-03-15 NOTE — Telephone Encounter (Signed)
Follow up    Patients daughter is calling back about fathers BP. She states that he is still feeling dizzy. Was speaking with daughter and lost the call.

## 2019-03-15 NOTE — Telephone Encounter (Signed)
Transmission received.  AF burden below.  Presenting rhythm AP/VP.    Chanetta Marshall, NP 03/15/2019 9:35 AM

## 2019-03-15 NOTE — Patient Instructions (Signed)
You are being direct admitted to [place] at the [facility], and let them take you to the Registration.  We hope you feel better soon!

## 2019-03-15 NOTE — H&P (Addendum)
Cardiology Office Note   Date: 03/15/2019  ID: Jb, [place] [birth year], MRN [id number]  PCP: Bernerd Limbo, MD  Cardiologist: [name], MD  Electrophysiologist: Cristopher Peru, MD  Chief Complaint: BP issue and dizziness  History of Present Illness:  Jamair Cato is a 85 y.o. male with coronary artery disease status post CABG in 2011 and drug-eluting stent x2 to the LAD in 2013, paroxysmal atrial fibrillation, symptomatic bradycardia with Mobitz 2 status post pacemaker implantation in January 7169, combined systolic and diastolic heart failure, hypertension, hyperlipidemia, CKD stage II and OSA on CPAP seen for BP and dizziness.  Cardiac Catheterization at [facility] in June 2018 demonstrated patent LIMA-LAD and diffuse disease elsewhere. All vein grafts were known to be occluded. There were no targets for PCI and medical therapy was recommended. He is on Apixaban for anticoagulation. Last echo December 2019 demonstrated EF 40-45 >> Dr. Harrington Challenger recommended medical therapy.  Recently dealing with volume overload >> increased Torsemide.  Patient is presented with fluctuating blood pressure and dizzy spell with weight gain. Symptoms started 2 weeks ago. He sleeps on recliner chronically. Compliant with his CPAP. Uses low-sodium diet. Patient feels extremely dizzy when trying to stands up. Gained approximately 8-10's pounds. Balance issue due to right knee issue. Denies chest pain, palpitation or syncope. No bleeding issue.      Past Medical History:  Diagnosis Date  . Asthma   . Benign neoplasm of colon   . CAD (coronary artery disease) Dec 2011   s/p CABG per Dr. Roxy Manns; had normal EF; All SVGs occluded per follow up cath with only LIMA to LAD patent; s/p PCI of the proximal and mid LAD February 2014 per Dr. Burt Knack  . CHF (congestive heart failure) ([place])   . Dyspnea   . Dyspnea on exertion   . Edema   . Emphysema    "never treated for it" (10/05/2015)  . History of hiatal hernia   . HLD  (hyperlipidemia)   . HTN (hypertension)   . Malaise and fatigue   . Obesity   . Osteopenia   . Pneumonia 1960s X 1; 1990s X 1  . Presence of permanent cardiac pacemaker   . Prostate cancer ([place])   . Skin cancer    "face, nose, top of ears, scalp"  . Symptomatic bradycardia    STJ PPM, 10/05/15, Dr. Lovena Le        Past Surgical History:  Procedure Laterality Date  . ANTERIOR CERVICAL DECOMP/DISCECTOMY FUSION    . BACK SURGERY    . C-EYE SURGERY PROCEDURE    . CATARACT EXTRACTION Right   . CORONARY ANGIOPLASTY WITH STENT PLACEMENT  10/14/12   with stent to proximal and mid LAD  . CORONARY ARTERY BYPASS GRAFT  08/23/10   "CABG X4"  . EP IMPLANTABLE DEVICE N/A 10/05/2015   Procedure: Pacemaker Implant; Surgeon: [name], MD; Location: [place] [facility]; Service: Cardiovascular; Laterality: N/A;  . EP IMPLANTABLE DEVICE N/A 10/06/2015   Procedure: Lead Revision/Repair; Surgeon: [name], MD; Location: [place] [facility]; Service: Cardiovascular; Laterality: N/A;  . EYE SURGERY    . INSERT / REPLACE / REMOVE PACEMAKER  10/05/2015  . LEFT HEART CATHETERIZATION WITH CORONARY/GRAFT ANGIOGRAM N/A 09/01/2012   Procedure: LEFT HEART CATHETERIZATION WITH [name]; Surgeon: [name], MD; Location: [facility] CATH LAB; Service: Cardiovascular; Laterality: N/A;  . MEDIAN STERNOTOMY  08/23/10  . MOHS SURGERY  "@ least twice"  . PACEMAKER INSERTION     STJ  10/05/15  . PERCUTANEOUS CORONARY STENT INTERVENTION (PCI-S) N/A 10/14/2012   Procedure: PERCUTANEOUS CORONARY STENT INTERVENTION (PCI-S); Surgeon: [name], MD; Location: [facility] CATH LAB; Service: Cardiovascular; Laterality: N/A;  . PROSTATE BIOPSY    . TRANSURETHRAL RESECTION OF PROSTATE    Current Medications:         Prior to Admission medications   Medication Sig Start Date End Date Taking? Authorizing Provider  apixaban (ELIQUIS) 5 MG TABS tablet Take 1 tablet (5 mg total) by mouth 2 (two) times daily. 09/15/18    [name], MD  carvedilol (COREG) 6.25 MG tablet TAKE 1 TABLET BY MOUTH TWICE DAILY 03/04/19   Imogene Burn, PA-C  Melatonin 5 MG TABS Take 5 mg by mouth as needed (sleep).  08/07/18   [provider]  nitroGLYCERIN (NITROSTAT) 0.4 MG SL tablet Place 0.4 mg under the tongue every 5 (five) minutes x 3 doses as needed for chest pain. 08/09/13   [name], MD  polyethylene glycol powder [facility]) powder Take 1 Container by mouth daily.  09/12/15   [provider]  potassium chloride SA (K-DUR) 20 MEQ tablet Take 20 mEq by mouth 2 (two) times daily.    [provider]  pravastatin (PRAVACHOL) 20 MG tablet TAKE 1 TABLET BY MOUTH DAILY 01/27/19   Richardson Dopp T, PA-C  tamsulosin (FLOMAX) 0.4 MG CAPS capsule Take 0.4 mg by mouth daily.    [provider]  torsemide (DEMADEX) 20 MG tablet Patient take two tablets by mouth in the AM and one tablet by mouth in the PM    [provider]  Allergies: Patient has no known allergies.  Social History        Socioeconomic History  . Marital status: Widowed    Spouse name: Not on file  . Number of children: Not on file  . Years of education: Not on file  . Highest education level: Not on file  Occupational History  . Occupation: retired Information systems manager: RETIRED  Social Needs  . Financial resource strain: Not on file  . Food insecurity    Worry: Not on file    Inability: Not on file  . Transportation needs    Medical: Not on file    Non-medical: Not on file  Tobacco Use  . Smoking status: Former Smoker    Packs/day: 1.00    Years: 30.00    Pack years: 30.00    Types: Cigarettes    Quit date: 09/07/1975    Years since quitting: 43.5  . Smokeless tobacco: Former Systems developer    Types: [place] date: 07/27/2015  Substance and Sexual Activity  . Alcohol use: No    Alcohol/week: 0.0 standard drinks  . Drug use: No  . Sexual activity: Not Currently  Lifestyle  . Physical activity     Days per week: Not on file    Minutes per session: Not on file  . Stress: Not on file  Relationships  . Social Herbalist on phone: Not on file    Gets together: Not on file    Attends religious service: Not on file    Active member of club or organization: Not on file    Attends meetings of clubs or organizations: Not on file    Relationship status: Not on file  Other Topics Concern  . Not on file  Social History Narrative  . Not on file  Family History: The patient's  family history includes Heart attack in his father; Heart disease in his brother; Hypertension in his mother; Kidney cancer in his father; Prostate cancer in his brother; Skin cancer in his brother; Stroke in his mother.  ROS:  Please see the history of present illness.  ROS All other systems reviewed and are negative.  PHYSICAL EXAM:   VS: BP 118/72  Pulse 70  Ht 5\' 10"  (1.778 m)  Wt 288 lb (130.6 kg)  SpO2 99%  BMI 41.32 kg/m  GEN: Well nourished, well developed, in no acute distress  HEENT: normal  Neck: + JVD, carotid bruits, or masses  Cardiac: RRR; no murmurs, rubs, or gallops, 2+ BL LE edema  Respiratory: clear to auscultation bilaterally, normal work of breathing  GI: soft, nontender, nondistended, + BS  MS: no deformity or atrophy  Skin: warm and dry, no rash  Neuro: Alert and Oriented x 3, Strength and sensation are intact  Psych: euthymic mood, full affect     Wt Readings from Last 3 Encounters:  03/15/19 288 lb (130.6 kg)  01/01/19 280 lb (127 kg)  12/18/18 278 lb 12.8 oz (126.5 kg)   Studies/Labs Reviewed:  EKG: EKG is ordered today. The ekg ordered today demonstrates A paced rhythm  Recent Labs:  09/11/2018: NT-Pro BNP 2,599  10/06/2018: ALT 14; B Natriuretic Peptide 203.0; Magnesium 2.4; TSH 7.828  11/11/2018: Hemoglobin 13.1; Platelets 182  01/11/2019: BUN 29; Creatinine, Ser 1.25; Potassium 4.9; Sodium 140  Lipid Panel  Labs (Brief)                                                           Additional studies/ records that were reviewed today include:  Echocardiogram: 08/2018  Study Conclusions  - Left ventricle: The cavity size was normal. There was mild  concentric hypertrophy. Systolic function was mildly to  moderately reduced. The estimated ejection fraction was in the  range of 40% to 45%. Hypokinesis of the mid-apicalinferior,  inferoseptal, and apical myocardium. Features are consistent with  a pseudonormal left ventricular filling pattern, with concomitant  abnormal relaxation and increased filling pressure (grade 2  diastolic dysfunction). Doppler parameters are consistent with  indeterminate ventricular filling pressure.  - Aortic valve: Transvalvular velocity was within the normal range.  There was no stenosis. There was no regurgitation.  - Mitral valve: Calcified annulus. Transvalvular velocity was  within the normal range. There was no evidence for stenosis.  There was no regurgitation.  - Right ventricle: The cavity size was normal. Wall thickness was  normal. Systolic function was normal.  - Right atrium: The atrium was moderately dilated.  - Atrial septum: The septum bowed from right to left, consistent  with increased right atrial pressure.  - Tricuspid valve: There was mild regurgitation.  - Pulmonary arteries: Systolic pressure could not be accurately  estimated.    ASSESSMENT & PLAN:  1. Acute on chronic combined CHF Failure outpatient diuresis. Weight is up 10 pounds compared to April. Volume overload by exam. Patient was seen by Dr. Harrington Challenger. Unsafe to diurese as outpatient due to significant orthostasis by symptoms and fall risk. Admit with plan to diuresis with IV Lasix 80 mg twice daily. When stable he will need PT. Check CBC, BMET and BNP.    2. PAF  -47% AF burden by  device check. A paced rhythm today. Continue Eliquis for anticoagulation. Continue Coreg 6.25 mg twice daily.  3. CAD status post CABG  -No angina. Not on  aspirin due to need of anticoagulation. Continue statin and beta-blocker.  4. COVID education  - No exposure. Get repeat testing at hospital. Asymptomatic.  Medication Adjustments/Labs and Tests Ordered:  Current medicines are reviewed at length with the patient today. Concerns regarding medicines are outlined above. Medication changes, Labs and Tests ordered today are listed in the Patient Instructions below.  Patient Instructions  You are being direct admitted to [place] at the [facility], and let them take you to the Registration.   We hope you feel better soon!  Jarrett Soho, [place]  03/15/2019 1:34 PM  [facility]  [place], [place], [place] [address]  Phone: [phone]; Fax: [phone]   Patient seen and examined   I agree with findingas as noted above by B Bhagat.  Pt well known to me Presents today with increased SOB, increased wt, increased edema  Also dizziness     On exam:   Neck:  Full  + JVD Lungs are relatively clear Cardiac exam:   RRR  Normal S, S2  No murmurs   Ext with 2+ edema    Plan to admit for IV diuresis with close follow up of labs, bloodpressures.   I do not think he would do well with trial of outpt given age, dizziness. Dietary to see for diet instruction (low salt)  [name] MD

## 2019-03-15 NOTE — Progress Notes (Signed)
Cardiology Office Note    Date:  03/15/2019   ID:  Levi [name], Levi [name] 12-03-1928, MRN [id number]  PCP:  Bernerd Limbo, MD  Cardiologist:  [name], MD   Electrophysiologist:  Cristopher Peru, MD   Chief Complaint: BP issue and dizziness   History of Present Illness:   Levi [name] is a 85 y.o. male  with coronary artery disease status post CABG in 2011 and drug-eluting stent x2 to the LAD in 2013, paroxysmal atrial fibrillation, symptomatic bradycardia with Mobitz 2 status post pacemaker implantation in January 1607,PXTGGYIR systolic anddiastolic heart failure, hypertension, hyperlipidemia, CKD stage II and OSA on CPAP seen for BP and dizziness.   Cardiac Catheterization at [facility] June 2018 demonstrated patent LIMA-LAD and diffuse disease elsewhere.  All vein grafts were known to be occluded. There were no targets for PCIandmedical therapy was recommended. He is on Apixaban for anticoagulation. Last echo December 2019 demonstrated EF 40-45 >> Dr. Harrington Challenger recommended medical therapy.   Recently dealing with volume overload >> increased Torsemide.   Patient is presented with fluctuating blood pressure and dizzy spell with weight gain.  Symptoms started 2 weeks ago.  He sleeps on recliner chronically.  Compliant with his CPAP.  Uses low-sodium diet.  Patient feels extremely dizzy when trying to stands up.  Gained approximately 8-10's pounds.  Balance issue due to right knee issue.  Denies chest pain, palpitation or syncope.  No bleeding issue.  Past Medical History:  Diagnosis Date  . Asthma   . Benign neoplasm of colon   . CAD (coronary artery disease) Dec 2011   s/p CABG per Dr. Roxy Manns; had normal EF; All SVGs occluded per follow up cath with only LIMA to LAD patent; s/p PCI of the proximal and mid LAD February 2014 per Dr. Burt Knack  . CHF (congestive heart failure) ([place])   . Dyspnea   . Dyspnea on exertion   . Edema   . Emphysema    "never treated for it" (10/05/2015)   . History of hiatal hernia   . HLD (hyperlipidemia)   . HTN (hypertension)   . Malaise and fatigue   . Obesity   . Osteopenia   . Pneumonia 1960s X 1; 1990s X 1  . Presence of permanent cardiac pacemaker   . Prostate cancer ([place])   . Skin cancer    "face, nose, top of ears, scalp"  . Symptomatic bradycardia    STJ PPM, 10/05/15, Dr. Lovena Le    Past Surgical History:  Procedure Laterality Date  . ANTERIOR CERVICAL DECOMP/DISCECTOMY FUSION    . BACK SURGERY    . C-EYE SURGERY PROCEDURE    . CATARACT EXTRACTION Right   . CORONARY ANGIOPLASTY WITH STENT PLACEMENT  10/14/12   with stent to proximal and mid LAD  . CORONARY ARTERY BYPASS GRAFT  08/23/10   "CABG X4"  . EP IMPLANTABLE DEVICE N/A 10/05/2015   Procedure: Pacemaker Implant;  Surgeon: [name], MD;  Location: [place] CV LAB;  Service: Cardiovascular;  Laterality: N/A;  . EP IMPLANTABLE DEVICE N/A 10/06/2015   Procedure: Lead Revision/Repair;  Surgeon: [name], MD;  Location: [place] [facility];  Service: Cardiovascular;  Laterality: N/A;  . EYE SURGERY    . INSERT / REPLACE / REMOVE PACEMAKER  10/05/2015  . LEFT HEART CATHETERIZATION WITH CORONARY/GRAFT ANGIOGRAM N/A 09/01/2012   Procedure: LEFT HEART CATHETERIZATION WITH [name];  Surgeon: [name], MD;  Location: [facility] CATH LAB;  Service: Cardiovascular;  Laterality: N/A;  . MEDIAN STERNOTOMY  08/23/10  . MOHS SURGERY  "@ least twice"  . PACEMAKER INSERTION     STJ 10/05/15  . PERCUTANEOUS CORONARY STENT INTERVENTION (PCI-S) N/A 10/14/2012   Procedure: PERCUTANEOUS CORONARY STENT INTERVENTION (PCI-S);  Surgeon: [name], MD;  Location: [facility] CATH LAB;  Service: Cardiovascular;  Laterality: N/A;  . PROSTATE BIOPSY    . TRANSURETHRAL RESECTION OF PROSTATE      Current Medications: Prior to Admission medications   Medication Sig Start Date End Date Taking? Authorizing Provider  apixaban (ELIQUIS) 5 MG TABS tablet Take 1 tablet (5 mg  total) by mouth 2 (two) times daily. 09/15/18   [name], MD  carvedilol (COREG) 6.25 MG tablet TAKE 1 TABLET BY MOUTH  TWICE DAILY 03/04/19   Imogene Burn, PA-C  Melatonin 5 MG TABS Take 5 mg by mouth as needed (sleep).  08/07/18   [provider]  nitroGLYCERIN (NITROSTAT) 0.4 MG SL tablet Place 0.4 mg under the tongue every 5 (five) minutes x 3 doses as needed for chest pain. 08/09/13   [name], MD  polyethylene glycol powder [facility]) powder Take 1 Container by mouth daily.  09/12/15   [provider]  potassium chloride SA (K-DUR) 20 MEQ tablet Take 20 mEq by mouth 2 (two) times daily.    [provider]  pravastatin (PRAVACHOL) 20 MG tablet TAKE 1 TABLET BY MOUTH  DAILY 01/27/19   Richardson Dopp T, PA-C  tamsulosin (FLOMAX) 0.4 MG CAPS capsule Take 0.4 mg by mouth daily.    [provider]  torsemide (DEMADEX) 20 MG tablet Patient take two tablets by mouth in the AM and one tablet by mouth in the PM    [provider]    Allergies:   Patient has no known allergies.   Social History   Socioeconomic History  . Marital status: Widowed    Spouse name: Not on file  . Number of children: Not on file  . Years of education: Not on file  . Highest education level: Not on file  Occupational History  . Occupation: retired Information systems manager: RETIRED  Social Needs  . Financial resource strain: Not on file  . Food insecurity    Worry: Not on file    Inability: Not on file  . Transportation needs    Medical: Not on file    Non-medical: Not on file  Tobacco Use  . Smoking status: Former Smoker    Packs/day: 1.00    Years: 30.00    Pack years: 30.00    Types: Cigarettes    Quit date: 09/07/1975    Years since quitting: 43.5  . Smokeless tobacco: Former Systems developer    Types: [place] date: 07/27/2015  Substance and Sexual Activity  . Alcohol use: No    Alcohol/week: 0.0 standard drinks  . Drug use: No  . Sexual activity:  Not Currently  Lifestyle  . Physical activity    Days per week: Not on file    Minutes per session: Not on file  . Stress: Not on file  Relationships  . Social Herbalist on phone: Not on file    Gets together: Not on file    Attends religious service: Not on file    Active member of club or organization: Not on file    Attends meetings of clubs or organizations: Not on file    Relationship status: Not  on file  Other Topics Concern  . Not on file  Social History Narrative  . Not on file     Family History:  The patient's family history includes Heart attack in his father; Heart disease in his brother; Hypertension in his mother; Kidney cancer in his father; Prostate cancer in his brother; Skin cancer in his brother; Stroke in his mother.   ROS:   Please see the history of present illness.    ROS All other systems reviewed and are negative.   PHYSICAL EXAM:   VS:  BP 118/72   Pulse 70   Ht 5\' 10"  (1.778 m)   Wt 288 lb (130.6 kg)   SpO2 99%   BMI 41.32 kg/m    GEN: Well nourished, well developed, in no acute distress  HEENT: normal  Neck: + JVD, carotid bruits, or masses Cardiac: RRR; no murmurs, rubs, or gallops, 2+ BL LE edema  Respiratory:  clear to auscultation bilaterally, normal work of breathing GI: soft, nontender, nondistended, + BS MS: no deformity or atrophy  Skin: warm and dry, no rash Neuro:  Alert and Oriented x 3, Strength and sensation are intact Psych: euthymic mood, full affect  Wt Readings from Last 3 Encounters:  03/15/19 288 lb (130.6 kg)  01/01/19 280 lb (127 kg)  12/18/18 278 lb 12.8 oz (126.5 kg)      Studies/Labs Reviewed:   EKG:  EKG is ordered today.  The ekg ordered today demonstrates A paced rhythm   Recent Labs: 09/11/2018: NT-Pro BNP 2,599 10/06/2018: ALT 14; B Natriuretic Peptide 203.0; Magnesium 2.4; TSH 7.828 11/11/2018: Hemoglobin 13.1; Platelets 182 01/11/2019: BUN 29; Creatinine, Ser 1.25; Potassium 4.9; Sodium 140    Lipid Panel    Component Value Date/Time   CHOL 113 06/01/2014 0930   TRIG 115.0 06/01/2014 0930   HDL 32.40 (L) 06/01/2014 0930   CHOLHDL 3 06/01/2014 0930   VLDL 23.0 06/01/2014 0930   LDLCALC 58 06/01/2014 0930   LDLDIRECT 70.7 06/10/2013 1344    Additional studies/ records that were reviewed today include:   Echocardiogram: 08/2018 Study Conclusions  - Left ventricle: The cavity size was normal. There was mild   concentric hypertrophy. Systolic function was mildly to   moderately reduced. The estimated ejection fraction was in the   range of 40% to 45%. Hypokinesis of the mid-apicalinferior,   inferoseptal, and apical myocardium. Features are consistent with   a pseudonormal left ventricular filling pattern, with concomitant   abnormal relaxation and increased filling pressure (grade 2   diastolic dysfunction). Doppler parameters are consistent with   indeterminate ventricular filling pressure. - Aortic valve: Transvalvular velocity was within the normal range.   There was no stenosis. There was no regurgitation. - Mitral valve: Calcified annulus. Transvalvular velocity was   within the normal range. There was no evidence for stenosis.   There was no regurgitation. - Right ventricle: The cavity size was normal. Wall thickness was   normal. Systolic function was normal. - Right atrium: The atrium was moderately dilated. - Atrial septum: The septum bowed from right to left, consistent   with increased right atrial pressure. - Tricuspid valve: There was mild regurgitation. - Pulmonary arteries: Systolic pressure could not be accurately   estimated.   ASSESSMENT & PLAN:    1. Acute on chronic combined CHF Failure outpatient diuresis.  Weight is up 10 pounds compared to April.  Volume overload by exam.  Patient was seen by Dr. Harrington Challenger.  Unsafe to diurese  as outpatient due to significant orthostasis by symptoms and fall risk.  Admit with plan to diuresis with IV Lasix 80  mg twice daily.  When stable he will need PT.  Check CBC, BMET and BNP.  2. PAF -47% AF burden by device check.  A paced rhythm today.  Continue Eliquis for anticoagulation.  Continue Coreg 6.25 mg twice daily.  3.  CAD status post CABG -No angina.  Not on aspirin due to need of anticoagulation.  Continue statin and beta-blocker.   4. COVID education - No exposure.  Get repeat testing at hospital.  Asymptomatic.  Medication Adjustments/Labs and Tests Ordered: Current medicines are reviewed at length with the patient today.  Concerns regarding medicines are outlined above.  Medication changes, Labs and Tests ordered today are listed in the Patient Instructions below. Patient Instructions  You are being direct admitted to [place] at the [facility], and let them take you to the Registration.  We hope you feel better soon!    Jarrett Soho, [place]  03/15/2019 1:34 PM    [facility] [place], [place], [place]  [address] Phone: [phone]; Fax: [phone]

## 2019-03-16 ENCOUNTER — Encounter (HOSPITAL_COMMUNITY): Payer: Self-pay | Admitting: General Practice

## 2019-03-16 ENCOUNTER — Ambulatory Visit: Payer: Medicare Other | Admitting: Physician Assistant

## 2019-03-16 LAB — BASIC METABOLIC PANEL
Anion gap: 9 (ref 5–15)
BUN: 22 mg/dL (ref 8–23)
CO2: 26 mmol/L (ref 22–32)
Calcium: 8.9 mg/dL (ref 8.9–10.3)
Chloride: 103 mmol/L (ref 98–111)
Creatinine, Ser: 1.25 mg/dL — ABNORMAL HIGH (ref 0.61–1.24)
GFR calc Af Amer: 59 mL/min — ABNORMAL LOW (ref >60)
GFR calc non Af Amer: 51 mL/min — ABNORMAL LOW (ref >60)
Glucose, Bld: 106 mg/dL — ABNORMAL HIGH (ref 70–99)
Potassium: 3.7 mmol/L (ref 3.5–5.1)
Sodium: 138 mmol/L (ref 135–145)

## 2019-03-16 NOTE — Progress Notes (Signed)
Called pt's daughter : Fordyce Lepak with pt's permission. Spoke with Cheron Schaumann regarding pt's plan of care. Questions and concerns were answered.

## 2019-03-16 NOTE — Plan of Care (Signed)
  Problem: Education: Goal: Knowledge of General Education information will improve Description: Including pain rating scale, medication(s)/side effects and non-pharmacologic comfort measures 03/16/2019 2318 by Mickie Kay, RN Outcome: Progressing 03/16/2019 2317 by Mickie Kay, RN Outcome: Progressing   Problem: Health Behavior/Discharge Planning: Goal: Ability to manage health-related needs will improve 03/16/2019 2318 by Mickie Kay, RN Outcome: Progressing 03/16/2019 2317 by Mickie Kay, RN Outcome: Progressing   Problem: Clinical Measurements: Goal: Ability to maintain clinical measurements within normal limits will improve 03/16/2019 2318 by Mickie Kay, RN Outcome: Progressing 03/16/2019 2317 by Mickie Kay, RN Outcome: Progressing Goal: Will remain free from infection 03/16/2019 2318 by Mickie Kay, RN Outcome: Progressing 03/16/2019 2317 by Mickie Kay, RN Outcome: Progressing Goal: Diagnostic test results will improve 03/16/2019 2318 by Mickie Kay, RN Outcome: Progressing 03/16/2019 2317 by Mickie Kay, RN Outcome: Progressing Goal: Respiratory complications will improve 03/16/2019 2318 by Mickie Kay, RN Outcome: Progressing 03/16/2019 2317 by Mickie Kay, RN Outcome: Progressing Goal: Cardiovascular complication will be avoided 03/16/2019 2318 by Mickie Kay, RN Outcome: Progressing 03/16/2019 2317 by Mickie Kay, RN Outcome: Progressing   Problem: Activity: Goal: Risk for activity intolerance will decrease 03/16/2019 2318 by Mickie Kay, RN Outcome: Progressing 03/16/2019 2317 by Mickie Kay, RN Outcome: Progressing   Problem: Nutrition: Goal: Adequate nutrition will be maintained 03/16/2019 2318 by Mickie Kay, RN Outcome: Progressing 03/16/2019 2317 by Mickie Kay, RN Outcome: Progressing   Problem: Coping: Goal: Level of anxiety will decrease 03/16/2019 2318 by Mickie Kay, RN Outcome: Progressing 03/16/2019 2317 by Mickie Kay, RN Outcome: Progressing   Problem: Elimination: Goal: Will not experience complications related to bowel motility 03/16/2019 2318 by Mickie Kay, RN Outcome: Progressing 03/16/2019 2317 by Mickie Kay, RN Outcome: Progressing Goal: Will not experience complications related to urinary retention 03/16/2019 2318 by Mickie Kay, RN Outcome: Progressing 03/16/2019 2317 by Mickie Kay, RN Outcome: Progressing   Problem: Pain Managment: Goal: General experience of comfort will improve 03/16/2019 2318 by Mickie Kay, RN Outcome: Progressing 03/16/2019 2317 by Mickie Kay, RN Outcome: Progressing   Problem: Safety: Goal: Ability to remain free from injury will improve 03/16/2019 2318 by Mickie Kay, RN Outcome: Progressing 03/16/2019 2317 by Mickie Kay, RN Outcome: Progressing   Problem: Skin Integrity: Goal: Risk for impaired skin integrity will decrease 03/16/2019 2318 by Mickie Kay, RN Outcome: Progressing 03/16/2019 2317 by Mickie Kay, RN Outcome: Progressing

## 2019-03-16 NOTE — Plan of Care (Signed)

## 2019-03-16 NOTE — Discharge Instructions (Signed)

## 2019-03-16 NOTE — Progress Notes (Addendum)
Progress Note  Patient Name: Levi [name] Date of Encounter: 03/16/2019  Primary Cardiologist: [name], MD  EP: Dr. Lovena Le  Subjective   Has been having increasing SOB for 6 month, but his breathing is better this morning. Denies any chest pain  Inpatient Medications    Scheduled Meds: . apixaban  5 mg Oral BID  . carvedilol  6.25 mg Oral BID  . furosemide  80 mg Intravenous BID  . polyethylene glycol  17 g Oral Daily  . potassium chloride  20 mEq Oral BID  . pravastatin  20 mg Oral Daily  . tamsulosin  0.4 mg Oral Daily   Continuous Infusions:  PRN Meds: acetaminophen, Melatonin, nitroGLYCERIN, nitroGLYCERIN, ondansetron (ZOFRAN) IV   Vital Signs    Vitals:   03/15/19 1700 03/15/19 2028 03/16/19 0023 03/16/19 0425  BP: (!) 161/67 (!) 147/64 125/60 (!) 143/62  Pulse: 81 70 70 72  Resp: 18 18 20 18   Temp: 97.8 F (36.6 C) 97.7 F (36.5 C) (!) 97.5 F (36.4 C) (!) 97.3 F (36.3 C)  TempSrc: Oral Oral  Oral  SpO2: 95% 92% 96% 96%  Weight:    126.6 kg    Intake/Output Summary (Last 24 hours) at 03/16/2019 0740 Last data filed at 03/16/2019 0428 Gross per 24 hour  Intake 665 ml  Output 2300 ml  Net -1635 ml   Last 3 Weights 03/16/2019 03/15/2019 01/01/2019  Weight (lbs) 279 lb 288 lb 280 lb  Weight (kg) 126.554 kg 130.636 kg 127.007 kg      Telemetry    Paced rhythm - Personally Reviewed  ECG    Paced rhythm - Personally Reviewed  Physical Exam   GEN: No acute distress.   Neck: No JVD Cardiac: RRR, no rubs, or gallops. 1/6 systolic murmur near apex Respiratory: Clear to auscultation bilaterally. GI: Soft, nontender, non-distended  MS: No edema; No deformity. Neuro:  Nonfocal  Psych: Normal affect   Labs    High Sensitivity Troponin:  No results for input(s): TROPONINIHS in the last 720 hours.    Cardiac EnzymesNo results for input(s): TROPONINI in the last 168 hours. No results for input(s): TROPIPOC in the last 168 hours.    Chemistry Recent Labs  Lab 03/15/19 1502  NA 139  K 3.7  CL 101  CO2 27  GLUCOSE 104*  BUN 22  CREATININE 1.22  CALCIUM 8.8*  PROT 6.7  ALBUMIN 3.4*  AST 21  ALT 14  ALKPHOS 90  BILITOT 0.6  GFRNONAA 53*  GFRAA >60  ANIONGAP 11     Hematology Recent Labs  Lab 03/15/19 1502  WBC 9.1  RBC 4.19*  HGB 13.2  HCT 40.1  MCV 95.7  MCH 31.5  MCHC 32.9  RDW 15.4  PLT 193    BNP Recent Labs  Lab 03/15/19 1502  BNP 107.0*     DDimer No results for input(s): DDIMER in the last 168 hours.   Radiology    No results found.  Cardiac Studies   Echo 08/28/2018 LV EF: 40% -   45% Study Conclusions  - Left ventricle: The cavity size was normal. There was mild concentric hypertrophy. Systolic function was mildly to moderately reduced. The estimated ejection fraction was in the range of 40% to 45%. Hypokinesis of the mid-apicalinferior, inferoseptal, and apical myocardium. Features are consistent with a pseudonormal left ventricular filling pattern, with concomitant abnormal relaxation and increased filling pressure (grade 2 diastolic dysfunction). Doppler parameters are consistent with indeterminate ventricular filling  pressure. - Aortic valve: Transvalvular velocity was within the normal range. There was no stenosis. There was no regurgitation. - Mitral valve: Calcified annulus. Transvalvular velocity was within the normal range. There was no evidence for stenosis. There was no regurgitation. - Right ventricle: The cavity size was normal. Wall thickness was normal. Systolic function was normal. - Right atrium: The atrium was moderately dilated. - Atrial septum: The septum bowed from right to left, consistent with increased right atrial pressure. - Tricuspid valve: There was mild regurgitation. - Pulmonary arteries: Systolic pressure could not be accurately   estimated.  Patient Profile     85 y.o. male with PMH of CAD s/p CABG 2011 and DES x 2 to LAD in 2013, PAF,  Mobitz 2 s/p PPM 04/9380, combined systolic and diastolic CHF, HTN, HLD, CKD stage II and OSA on CPAP admitted with acute on chronic combined CHF.   Assessment & Plan    1. Acute on chronic combined systolic and diastolic heart failure  - on torsemide 40mg  AM and 20mg  PM at home. Current undergoing IV lasix 80mg  BID diuresis, net -1.6L  - No further significant JVD or LE extremity edema, very close to euvolemic level, pending BMET to check renal function, if renal function is stable, likely will try to push IV diuresis for today and consider potential discharge tomorrow.   2. CAD s/p CABG 2011: last cath 2018, no angina  3. PAF: afib burden 47% by remote transmission on 7/13  - continue eliquis  4. Mobitz 2 s/p PPM: device interrogation on 7/13  5. HTN: stable on coreg  6. HLD: on pravachol  For questions or updates, please contact [place] Please consult [email or web address] for contact info under        Signed, Almyra Deforest, [place]  03/16/2019, 7:40 AM   ---------------------------------------------------------------------------------------------   History and all data above reviewed.  Patient examined.  I agree with the findings as above.  Levi [name] is doing well, eating lunch with no complaints. Wants to know when he can go home.   Constitutional: No acute distress Eyes: pupils equally round and reactive to light, sclera non-icteric, normal conjunctiva and lids ENMT: normal dentition, moist mucous membranes Cardiovascular: regular rhythm, normal rate, no murmurs. S1 and S2 normal. Radial pulses normal bilaterally. No jugular venous distention.  Respiratory: clear to auscultation bilaterally GI : normal bowel sounds, soft and nontender. No distention.  MSK: extremities warm, well perfused. No edema.  NEURO: grossly nonfocal exam, moves all extremities. PSYCH: alert and oriented x 3, normal mood and affect.   All available labs, radiology testing, previous records  reviewed. Agree with documented assessment and plan of my colleague as stated above with the following additions or changes:  Active Problems:   Acute on chronic combined systolic (congestive) and diastolic (congestive) heart failure (HCC)    Plan: continue IV lasix today, renal function stable. Improving symptomatically and by exam.   Time Spent Directly with Patient:  I have spent a total of 35 minutes with the patient reviewing hospital notes, telemetry, EKGs, labs and examining the patient as well as establishing an assessment and plan that was discussed personally with the patient.  > 50% of time was spent in direct patient care.  Length of Stay:  LOS: 1 day   Elouise Munroe, MD [facility] 2:55 PM  03/16/2019

## 2019-03-17 ENCOUNTER — Encounter (HOSPITAL_COMMUNITY): Payer: Self-pay | Admitting: Physician Assistant

## 2019-03-17 DIAGNOSIS — R42 Dizziness and giddiness: Secondary | ICD-10-CM

## 2019-03-17 LAB — BASIC METABOLIC PANEL
Anion gap: 11 (ref 5–15)
BUN: 24 mg/dL — ABNORMAL HIGH (ref 8–23)
CO2: 26 mmol/L (ref 22–32)
Calcium: 8.9 mg/dL (ref 8.9–10.3)
Chloride: 102 mmol/L (ref 98–111)
Creatinine, Ser: 1.36 mg/dL — ABNORMAL HIGH (ref 0.61–1.24)
GFR calc Af Amer: 53 mL/min — ABNORMAL LOW (ref >60)
GFR calc non Af Amer: 46 mL/min — ABNORMAL LOW (ref >60)
Glucose, Bld: 102 mg/dL — ABNORMAL HIGH (ref 70–99)
Potassium: 3.7 mmol/L (ref 3.5–5.1)
Sodium: 139 mmol/L (ref 135–145)

## 2019-03-17 NOTE — Evaluation (Signed)
Physical Therapy Evaluation Patient Details Name: Levi [name] MRN: [id number] DOB: 05-Oct-1928 Today's Date: 03/17/2019   History of Present Illness  Pt is an 85 y/o male admitted secondary to dizziness, thought to be cause by essential HTN. Pt also with CHF. PMH includes CKD, HTN, a fib, CAD, and s/p pacemaker.   Clinical Impression  Pt admitted secondary to problem above with deficits below. Pt tolerated gait training well and was asymptomatic throughout. Required min guard to supervision for gait training with and without AD this session. Educated about using RW at d/c to improve safety with gait at home. Pt reports daughter checks on him regularly and is available to stay with him at night if needed. Will continue to follow acutely to maximize functional mobility independence and safety.     Follow Up Recommendations Home health PT;Supervision - Intermittent    Equipment Recommendations  None recommended by PT    Recommendations for Other Services       Precautions / Restrictions Precautions Precautions: Fall Restrictions Weight Bearing Restrictions: No      Mobility  Bed Mobility               General bed mobility comments: In chair upon entry   Transfers Overall transfer level: Needs assistance Equipment used: None Transfers: Sit to/from Stand Sit to Stand: Supervision         General transfer comment: Supervision for safety.   Ambulation/Gait Ambulation/Gait assistance: Min guard;Supervision Gait Distance (Feet): 150 Feet Assistive device: None;Rolling walker (2 wheeled) Gait Pattern/deviations: Step-through pattern;Decreased stride length;Wide base of support Gait velocity: Decreased    General Gait Details: Slow, unsteady gait without AD. Improved steadiness noted with use of RW. Educated about using RW at home to improve safety with mobility and pt agreeable. Pt asymptomatic throughout gait.   Stairs            Wheelchair Mobility     Modified Rankin (Stroke Patients Only)       Balance Overall balance assessment: Needs assistance Sitting-balance support: No upper extremity supported;Feet supported Sitting balance-Leahy Scale: Good     Standing balance support: Bilateral upper extremity supported;During functional activity Standing balance-Leahy Scale: Poor Standing balance comment: Reliant on BUE support                              Pertinent Vitals/Pain Pain Assessment: No/denies pain    Home Living Family/patient expects to be discharged to:: Private residence Living Arrangements: Alone Available Help at Discharge: Family;Available PRN/intermittently Type of Home: House Home Access: Stairs to enter Entrance Stairs-Rails: Right Entrance Stairs-Number of Steps: 2 Home Layout: One level Home Equipment: Cane - single point;Walker - 2 wheels;Walker - 4 wheels;Shower seat;Grab bars - toilet Additional Comments: Reports children can stay with him at night if needed. Reports children check on him frequently.     Prior Function Level of Independence: Independent with assistive device(s)         Comments: Reports using RW occasionally      Hand Dominance        Extremity/Trunk Assessment   Upper Extremity Assessment Upper Extremity Assessment: Overall WFL for tasks assessed    Lower Extremity Assessment Lower Extremity Assessment: Generalized weakness;RLE deficits/detail RLE Deficits / Details: Reports R knee pain and sometimes has buckling in R knee     Cervical / Trunk Assessment Cervical / Trunk Assessment: Normal  Communication   Communication: No difficulties  Cognition Arousal/Alertness: Awake/alert  Behavior During Therapy: WFL for tasks assessed/performed Overall Cognitive Status: Within Functional Limits for tasks assessed                                        General Comments General comments (skin integrity, edema, etc.): Educated about monitoring  symptoms when changing position and if pt is dizzy upon standing, to sit and rest until symptoms resolve before performing mobility tasks.     Exercises Other Exercises Other Exercises: Practiced marching with 1 UE support to simulate stair training X5 on BLE.    Assessment/Plan    PT Assessment Patient needs continued PT services  PT Problem List Decreased strength;Decreased balance;Decreased mobility;Decreased knowledge of use of DME;Decreased knowledge of precautions       PT Treatment Interventions DME instruction;Gait training;Functional mobility training;Stair training;Therapeutic activities;Therapeutic exercise;Balance training;Patient/family education    PT Goals (Current goals can be found in the Care Plan section)  Acute Rehab PT Goals Patient Stated Goal: to go home today PT Goal Formulation: With patient Time For Goal Achievement: 03/31/19 Potential to Achieve Goals: Good    Frequency Min 3X/week   Barriers to discharge        Co-evaluation               AM-PAC PT "6 Clicks" Mobility  Outcome Measure Help needed turning from your back to your side while in a flat bed without using bedrails?: None Help needed moving from lying on your back to sitting on the side of a flat bed without using bedrails?: None Help needed moving to and from a bed to a chair (including a wheelchair)?: A Little Help needed standing up from a chair using your arms (e.g., wheelchair or bedside chair)?: A Little Help needed to walk in hospital room?: A Little Help needed climbing 3-5 steps with a railing? : A Little 6 Click Score: 20    End of Session Equipment Utilized During Treatment: Gait belt Activity Tolerance: Patient tolerated treatment well Patient left: in chair;with call bell/phone within reach Nurse Communication: Mobility status PT Visit Diagnosis: Other abnormalities of gait and mobility (R26.89);Muscle weakness (generalized) (M62.81)    Time: 8850-2774 PT Time  Calculation (min) (ACUTE ONLY): 17 min   Charges:   PT Evaluation $PT Eval Low Complexity: [place], PT, DPT  Acute Rehabilitation Services  Pager: [phone] Office: [phone]   Rudean Hitt 03/17/2019, 3:03 PM

## 2019-03-17 NOTE — Progress Notes (Addendum)
Progress Note  Patient Name: Damire Remedios [facility] Date of Encounter: 03/17/2019  Primary Cardiologist: [name], MD  Subjective   Feeling much better today. Dizziness resolved. Has ambulated to bathroom without significant issue. Denies eating salt but when discussing diet, "I've been told to have one piece of bacon a day but I occasionally cheat and have two." We discussed the impact that sodium already in foods = salt.   Inpatient Medications    Scheduled Meds: . apixaban  5 mg Oral BID  . carvedilol  6.25 mg Oral BID  . furosemide  80 mg Intravenous BID  . polyethylene glycol  17 g Oral Daily  . potassium chloride  20 mEq Oral BID  . pravastatin  20 mg Oral Daily  . tamsulosin  0.4 mg Oral Daily   Continuous Infusions:  PRN Meds: acetaminophen, Melatonin, nitroGLYCERIN, nitroGLYCERIN, ondansetron (ZOFRAN) IV   Vital Signs    Vitals:   03/16/19 1650 03/16/19 1948 03/17/19 0446 03/17/19 0824  BP: 137/63 (!) 126/58 136/64 131/88  Pulse: 72 76 71 71  Resp: 18 18 18 20   Temp: 97.9 F (36.6 C) 98.6 F (37 C) 97.6 F (36.4 C) 98.4 F (36.9 C)  TempSrc: Oral Oral Oral Oral  SpO2: 94% 94% 91% 96%  Weight:   125.3 kg     Intake/Output Summary (Last 24 hours) at 03/17/2019 1036 Last data filed at 03/17/2019 0600 Gross per 24 hour  Intake 1440 ml  Output 3200 ml  Net -1760 ml   Last 3 Weights 03/17/2019 03/16/2019 03/15/2019  Weight (lbs) 276 lb 4.8 oz 279 lb 288 lb  Weight (kg) 125.329 kg 126.554 kg 130.636 kg     Telemetry    AV paced, HR controlled - Personally Reviewed  ECG    Atrial paced with wide QRS rhythm similar to prior, suspect AV paced based on telemetry review as well - Personally Reviewed  Physical Exam   GEN: No acute distress, morbidly obese HEENT: Normocephalic, atraumatic, sclera non-icteric. Neck: No JVD or bruits. Cardiac: RRR no murmurs, rubs, or gallops.  Radials/DP/PT 1+ and equal bilaterally.  Respiratory: Clear to auscultation  bilaterally. Breathing is unlabored. GI: Soft, nontender, non-distended, BS +x 4. MS: no deformity. Extremities: No clubbing or cyanosis. Trace pedal edema. Distal pedal pulses are 2+ and equal bilaterally. Neuro:  AAOx3. Follows commands. Psych:  Responds to questions appropriately with a normal affect.  Labs    Chemistry Recent Labs  Lab 03/15/19 1502 03/16/19 0759 03/17/19 0345  NA 139 138 139  K 3.7 3.7 3.7  CL 101 103 102  CO2 27 26 26   GLUCOSE 104* 106* 102*  BUN 22 22 24*  CREATININE 1.22 1.25* 1.36*  CALCIUM 8.8* 8.9 8.9  PROT 6.7  --   --   ALBUMIN 3.4*  --   --   AST 21  --   --   ALT 14  --   --   ALKPHOS 90  --   --   BILITOT 0.6  --   --   GFRNONAA 53* 51* 46*  GFRAA >60 59* 53*  ANIONGAP 11 9 11      Hematology Recent Labs  Lab 03/15/19 1502  WBC 9.1  RBC 4.19*  HGB 13.2  HCT 40.1  MCV 95.7  MCH 31.5  MCHC 32.9  RDW 15.4  PLT 193    BNP Recent Labs  Lab 03/15/19 1502  BNP 107.0*      Radiology    No results  found.  Cardiac Studies   2D echo 08/2018 Study Conclusions - Left ventricle: The cavity size was normal. There was mild   concentric hypertrophy. Systolic function was mildly to   moderately reduced. The estimated ejection fraction was in the   range of 40% to 45%. Hypokinesis of the mid-apicalinferior,   inferoseptal, and apical myocardium. Features are consistent with   a pseudonormal left ventricular filling pattern, with concomitant   abnormal relaxation and increased filling pressure (grade 2   diastolic dysfunction). Doppler parameters are consistent with   indeterminate ventricular filling pressure. - Aortic valve: Transvalvular velocity was within the normal range.   There was no stenosis. There was no regurgitation. - Mitral valve: Calcified annulus. Transvalvular velocity was   within the normal range. There was no evidence for stenosis.   There was no regurgitation. - Right ventricle: The cavity size was  normal. Wall thickness was   normal. Systolic function was normal. - Right atrium: The atrium was moderately dilated. - Atrial septum: The septum bowed from right to left, consistent   with increased right atrial pressure. - Tricuspid valve: There was mild regurgitation. - Pulmonary arteries: Systolic pressure could not be accurately   estimated.  Patient Profile     85 y.o. male with h/o coronary artery disease status post CABG in 2011 and drug-eluting stent x2 to the LAD in 2013, paroxysmal atrial fibrillation, symptomatic bradycardia with Mobitz 2 status post pacemaker implantation in January 2947, combined systolic and diastolic heart failure, hypertension, hyperlipidemia, CKD stage II and OSA on CPAP. He presented to office with fluctuating BP, dizzy spells, and weight gain of 8-10 lb. He was admitted for concern for acute on chronic combined CHF - not felt to be good candidate for outpatient therapy given complaints of orthostasis as well.   Assessment & Plan    1. Acute on chronic combined CHF - has diuresed 3.475L (excellent UOP of 3400 yesterday) - feeling much better. His office weight was 287lb. IP weights were listed as 279 -> 276.3. He is feeling much better. Suspect he can be discharged today. He was on torsemide 40mg  QAM/20mg  QPM. Will review plan for OP dosing with MD. Reviewed 2g sodium restriction, 2L fluid restriction, daily weights with patient. He also received CHF booklet.  2. Paroxysmal atrial fibrillation and h/o PPM - per admission notes, 47% AT/AF burden by device check on 7/13. AV paced on telemetry this admission with stable heart rates. He will continue with Eliquis and beta blocker therapy.  3. CAD s/p CABG - no angina. He is not on ASA due to need for anticoagulation and advanced age. Continue medical therapy.  4. Essential HTN with dizziness - BP stable. Will order PT to see prior to DC given that he is 87, lives alone, came in with dizziness, and is on Eliquis.  The patient does not think he has any acute needs but would be helpful from safety standpoint to have this info. He reports his daughter and granddaughter look in on him daily.  5. CKD stage II (by CrCl) - baseline Cr appears 1.2-1.4, stable this admission.  Tentatively arranged TOC f/u 7/23 with Vin (saw pt day of admission).  For questions or updates, please contact [place] Please consult [email or web address] for contact info under Cardiology/STEMI.  Signed, Charlie Pitter, PA-C 03/17/2019, 10:36 AM   ---------------------------------------------------------------------------------------------   History and all data above reviewed.  Patient examined.  I agree with the findings as above.  Janit Pagan Herberg feels  well and has been diuresed appropriately in hospital. Ready to go home.   Constitutional: No acute distress ENMT: normal dentition, moist mucous membranes Cardiovascular: regular rhythm, normal rate, no murmurs. S1 and S2 normal. Radial pulses normal bilaterally. No jugular venous distention.  Respiratory: clear to auscultation bilaterally GI : normal bowel sounds, soft and nontender. No distention.   MSK: extremities warm, well perfused. No edema.  NEURO: grossly nonfocal exam, moves all extremities. PSYCH: alert and oriented x 3, normal mood and affect.   All available labs, radiology testing, previous records reviewed. Agree with documented assessment and plan of my colleague as stated above with the following additions or changes:  Principal Problem:   Acute on chronic combined systolic (congestive) and diastolic (congestive) heart failure (HCC) Active Problems:   Essential hypertension   PAF (paroxysmal atrial fibrillation) (HCC)   CAD (coronary artery disease)   Presence of permanent cardiac pacemaker   CKD (chronic kidney disease) stage 2, GFR 60-89 ml/min   Dizziness    Plan: dismiss on home torsemide as noted above.  PT to see before dismissal.  Follow up  has been arranged.   Time Spent Directly with Patient:  I have spent a total of 35 minutes with the patient reviewing hospital notes, telemetry, EKGs, labs and examining the patient as well as establishing an assessment and plan that was discussed personally with the patient.  > 50% of time was spent in direct patient care.  Length of Stay:  LOS: 2 days   Elouise Munroe, MD [facility]

## 2019-03-17 NOTE — Plan of Care (Signed)
Nutrition Education Note  RD consulted for nutrition education regarding new onset CHF.  RD provided "Low Sodium Nutrition Therapy" handout from the Academy of [employer]. Reviewed patient's dietary recall. Provided examples on ways to decrease sodium intake in diet. Discouraged intake of processed foods and use of salt shaker. Encouraged fresh fruits and vegetables as well as whole grain sources of carbohydrates to maximize fiber intake.   Pt reports having a great appetite. A typical day consist of eggs with 1 slice of bacon for breakfast (sometimes 2 slices), chicken with a vegetable from his garden for lunch, and broiled fish with sweet potato from [employer] for dinner. Discussed ways he could season vegetables from his garden/meats without salt and food options he could choose from restaurants that contain less salt. Pt seemed to have good understanding of salt containing canned foods but was unaware of how much salt processed meats contain. Answered all questions.   RD discussed why it is important for patient to adhere to diet recommendations, and emphasized the role of fluids, foods to avoid, and importance of weighing self daily. Teach back method used.  Expect fair compliance.  Body mass index is 39.64 kg/m. Pt meets criteria for obese based on current BMI.  Current diet order is heart healthy, patient is consuming approximately 100% of meals at this time. Labs and medications reviewed. No further nutrition interventions warranted at this time. RD contact information provided. If additional nutrition issues arise, please re-consult RD.   Mariana Single RD, LDN Clinical Nutrition Pager # [phone]

## 2019-03-17 NOTE — TOC Initial Note (Signed)
Transition of Care [facility]) - Initial/Assessment Note    Patient Details  Name: Levi [name] MRN: [id number] Date of Birth: 13-May-1929  Transition of Care [facility]) CM/SW Contact:    Candie Chroman, LCSW Phone Number: 03/17/2019, 3:51 PM  Clinical Narrative: CSW met with patient, introduced role, and explained that PT recommendations would be discussed. Patient does not feel the need for home health PT, RN, or aide. His daughter and granddaughter frequently check on him and family lives next door/across the street. He has life alert and uses a rolling walker to get to the bathroom at night. Encouraged patient to notify his PCP if he feels he needs home health at some point. His neighbor will pick him up today. No further concerns. CSW signing off.                 Expected Discharge Plan: Home/Self Care Barriers to Discharge: Barriers Resolved   Patient Goals and CMS Choice        Expected Discharge Plan and Services Expected Discharge Plan: Home/Self Care       Living arrangements for the past 2 months: Single Family Home Expected Discharge Date: 03/17/19                                    Prior Living Arrangements/Services Living arrangements for the past 2 months: Single Family Home Lives with:: Self Patient language and need for interpreter reviewed:: Yes(No needs.) Do you feel safe going back to the place where you live?: Yes      Need for Family Participation in Patient Care: Yes (Comment) Care giver support system in place?: Yes (comment) Current home services: DME, Safety alert Criminal Activity/Legal Involvement Pertinent to Current Situation/Hospitalization: No - Comment as needed  Activities of Daily Living Home Assistive Devices/Equipment: CPAP, Blood pressure cuff, Walker (specify type), Cane (specify quad or straight), Eyeglasses, Dentures (specify type), Hearing aid, Grab bars around toilet, Hand-held shower hose, Scales ADL Screening (condition at  time of admission) Patient's cognitive ability adequate to safely complete daily activities?: Yes Is the patient deaf or have difficulty hearing?: No Does the patient have difficulty seeing, even when wearing glasses/contacts?: No Does the patient have difficulty concentrating, remembering, or making decisions?: No Patient able to express need for assistance with ADLs?: No Does the patient have difficulty dressing or bathing?: No Independently performs ADLs?: Yes (appropriate for developmental age) Does the patient have difficulty walking or climbing stairs?: No Weakness of Legs: None Weakness of Arms/Hands: None  Permission Sought/Granted                  Emotional Assessment Appearance:: Appears stated age Attitude/Demeanor/Rapport: Engaged, Gracious Affect (typically observed): Appropriate, Calm, Pleasant Orientation: : Oriented to Self, Oriented to Place, Oriented to  Time, Oriented to Situation Alcohol / Substance Use: Never Used Psych Involvement: No (comment)  Admission diagnosis:  Chronic combined heart failure Patient Active Problem List   Diagnosis Date Noted  . Dizziness 03/17/2019  . Acute on chronic combined systolic (congestive) and diastolic (congestive) heart failure ([place]) 03/15/2019  . CKD (chronic kidney disease) stage 2, GFR 60-89 ml/min 10/09/2018  . Ischemic cardiomyopathy 10/06/2018  . Presence of permanent cardiac pacemaker 06/08/2018  . Orthostatic hypotension 08/14/2017  . Chronic combined systolic and diastolic CHF (congestive heart failure) ([place]) 06/09/2017  . Obesity (BMI 30-39.9) 02/19/2017  . OSA (obstructive sleep apnea) 02/19/2017  . Decreased hearing of  both ears 12/24/2016  . Depression, reactive 12/06/2016  . Mobitz type 2 second degree atrioventricular block 10/05/2015  . Chronic anticoagulation 09/19/2015  . Bifascicular block 09/19/2015  . Bradycardia 09/19/2015  . Syncope 06/30/2014  . Lumbosacral spondylosis 02/21/2014  .  Personal history of nicotine dependence 10/29/2013  . Personal history of tobacco use, presenting hazards to health 10/29/2013  . H/O coronary artery bypass surgery 10/22/2012  . Angina, class III ([place]) 10/15/2012  . CA of prostate ([place]) 07/09/2012  . Gastro-esophageal reflux disease with esophagitis 07/09/2012  . Allergic rhinitis 06/15/2012  . Back pain 10/24/2011  . CAD (coronary artery disease) 06/05/2011  . Chronic coronary artery disease 06/05/2011  . PAF (paroxysmal atrial fibrillation) ([place]) 09/20/2010  . PROSTATE CANCER 12/27/2009  . ADENOMATOUS COLONIC POLYP 12/27/2009  . HLD (hyperlipidemia) 12/27/2009  . Morbid obesity ([place]) 12/27/2009  . Essential hypertension 12/27/2009  . EMPHYSEMA 12/27/2009  . OSTEOPENIA 12/27/2009  . MALAISE AND FATIGUE 12/27/2009  . Heart failure with preserved ejection fraction ([place]) 12/27/2009  . CHEST PAIN UNSPECIFIED 12/27/2009  . History of colonic polyps 12/04/2009   PCP:  Bernerd Limbo, MD Pharmacy:   [place], [place] [address] Phone: [phone] Fax: [phone]  [place], [place] [facility] [address] [place] [address] [place] [address] Phone: [phone] Fax: [phone]     Social Determinants of Health (SDOH) Interventions    Readmission Risk Interventions No flowsheet data found.

## 2019-03-17 NOTE — Discharge Summary (Signed)
Discharge Summary    Patient ID: Levi [name],  MRN: [id number], DOB/AGE: 83-Nov-1930 83 y.o.  Admit date: 03/15/2019 Discharge date: 03/17/2019  Primary Care Provider: Bernerd Limbo Primary Cardiologist: [name], MD Primary Electrophysiologist:  Cristopher Peru, MD  Discharge Diagnoses    Principal Problem:   Acute on chronic combined systolic (congestive) and diastolic (congestive) heart failure ([place]) Active Problems:   Essential hypertension   PAF (paroxysmal atrial fibrillation) ([place])   CAD (coronary artery disease)   Presence of permanent cardiac pacemaker   CKD (chronic kidney disease) stage 2, GFR 60-89 ml/min   Dizziness  Diagnostic Studies/Procedures    N/A this admission _____________     History of Present Illness     Levi [name] is a 85 y.o. male with history of oronary artery disease status post CABG in 2011 and drug-eluting stent x2 to the LAD in 2013, paroxysmal atrial fibrillation, symptomatic bradycardia with Mobitz 2 status post pacemaker implantation in January 7782, combined systolic and diastolic heart failure, hypertension, hyperlipidemia, CKD stage II and OSA on CPAP. He presented to office with fluctuating BP, dizzy spells, and weight gain of 8-10 lb. He was admitted for concern for acute on chronic combined CHF as he was not felt to be good candidate for outpatient therapy given complaints of orthostasis as well.   Hospital Course    1. Acute on chronic combined CHF - has diuresed 3.475L (excellent UOP of 3400 yesterday) - feeling much better. His office weight was 287lb. IP weights were listed as 279 -> 276.3 today. He is feeling much better. He was on torsemide 40mg  QAM/20mg  QPM prior to admission. Dr. Margaretann Loveless feels that he can remain on this dose, but with careful instructions for him to call for the 3-5lb weight gain threshold. Reviewed 2g sodium restriction, 2L fluid restriction, daily weights with patient - discussed implication of  sodium in foods like bacon. He also received CHF booklet and dietitian teaching.  2. Paroxysmal atrial fibrillation and h/o PPM - per admission notes, 47% AT/AF burden by device check on 7/13. AV paced on telemetry this admission with stable heart rates. He will continue with Eliquis and beta blocker therapy.  3. CAD s/p CABG - no angina. He is not on ASA due to need for anticoagulation and advanced age. Continue medical therapy.  4. Essential HTN with dizziness - BP stable.  He was seen by PT this admission and they recommended HHPT. He will be able to be discharged once care management has this set up.  5. CKD stage II (by CrCl) - baseline Cr appears 1.2-1.4, stable this admission.  Dr. Margaretann Loveless has seen and examined the patient today and feels he is stable for discharge. Close f/u has been arranged (TOC) on 7/23 with Vin Bhagat PA-C. I sent message to our office to make sure he gets that phone call. _____________  Discharge Vitals Blood pressure (!) 143/60, pulse 69, temperature (!) 97.3 F (36.3 C), temperature source Oral, resp. rate 20, weight 125.3 kg, SpO2 95 %.  Filed Weights   03/16/19 0425 03/17/19 0446  Weight: 126.6 kg 125.3 kg    Labs & Radiologic Studies    CBC Recent Labs    03/15/19 1502  WBC 9.1  NEUTROABS 6.3  HGB 13.2  HCT 40.1  MCV 95.7  PLT 423   Basic Metabolic Panel Recent Labs    03/16/19 0759 03/17/19 0345  NA 138 139  K 3.7 3.7  CL 103 102  CO2 26 26  GLUCOSE 106* 102*  BUN 22 24*  CREATININE 1.25* 1.36*  CALCIUM 8.9 8.9   Liver Function Tests Recent Labs    03/15/19 1502  AST 21  ALT 14  ALKPHOS 90  BILITOT 0.6  PROT 6.7  ALBUMIN 3.4*   No radiology studies performed  Disposition   Pt is being discharged home today in good condition.  Follow-up Plans & Appointments    Follow-up Information    [place], [place], [place] Follow up.   Specialty: Cardiology Why: [place] [facility] - 03/25/19 at 1:45pm (as  listed below). Remember to please arrive 15 minutes prior to appointment to check in. Vin is one of the PAs that works with the cardiology team - you saw him the day you were admitted. Contact information: [address] [place] [address] [phone]          Discharge Instructions    Diet - low sodium heart healthy   Complete by: As directed    Discharge instructions   Complete by: As directed    Please make sure to review the instructions in your heart failure book. If your weight goes up by 3 pounds overnight or 5 pounds overnight, you must call your cardiology office - even if on the weekend. We would like to help you try your best to stay out of the hospital!   Increase activity slowly   Complete by: As directed       Discharge Medications   Allergies as of 03/17/2019   No Known Allergies     Medication List    TAKE these medications   acetaminophen 500 MG tablet Commonly known as: TYLENOL Take 500 mg by mouth every 6 (six) hours as needed for mild pain or headache.   apixaban 5 MG Tabs tablet Commonly known as: Eliquis Take 1 tablet (5 mg total) by mouth 2 (two) times daily.   carvedilol 6.25 MG tablet Commonly known as: COREG TAKE 1 TABLET BY MOUTH  TWICE DAILY   docusate sodium 100 MG capsule Commonly known as: COLACE Take 100 mg by mouth at bedtime.   Melatonin 5 MG Tabs Take 5 mg by mouth as needed (sleep).   nitroGLYCERIN 0.4 MG SL tablet Commonly known as: NITROSTAT Place 0.4 mg under the tongue every 5 (five) minutes x 3 doses as needed for chest pain.   potassium chloride SA 20 MEQ tablet Commonly known as: K-DUR Take 20 mEq by mouth 2 (two) times daily.   pravastatin 20 MG tablet Commonly known as: PRAVACHOL TAKE 1 TABLET BY MOUTH  DAILY   tamsulosin 0.4 MG Caps capsule Commonly known as: FLOMAX Take 0.4 mg by mouth daily.   torsemide 20 MG tablet Commonly known as: DEMADEX Take 20-40 mg by mouth See admin instructions.  Take two tablets by mouth in the AM and one tablet by mouth in the PM        Allergies:  No Known Allergies   Outstanding Labs/Studies   N/A  Duration of Discharge Encounter   Greater than 30 minutes including physician time.  Signed, Charlie Pitter PA-C 03/17/2019, 3:06 PM

## 2019-03-17 NOTE — Plan of Care (Signed)

## 2019-03-18 ENCOUNTER — Telehealth: Payer: Self-pay

## 2019-03-18 NOTE — Telephone Encounter (Signed)
**Note De-identified Nyal Schachter Obfuscation** -----  **Note De-Identified Brette Cast Obfuscation** Message from Charlie Pitter, [place] sent at 03/17/2019 11:22 AM EDT ----- Regarding: TOC f/u This pt will likely be discharged later today from the hospital. I scheduled a f/u for him with Vin on 7/23 but will need the TOC call. Thanks!

## 2019-03-18 NOTE — Telephone Encounter (Addendum)
1st TCM call attempt: I have left a detailed message on both the pts home and cell phone asking him to call me back.

## 2019-03-18 NOTE — Telephone Encounter (Signed)
**Note De-Identified Levi [name] Obfuscation** The pt called me back.  Patient contacted regarding discharge from [facility] on 03/17/2019.  Patient understands to follow up with provider Robbie Lis, PA-c on 03/25/2019 at 1:45 at [place] [facility] in [place]. Patient understands discharge instructions? Yes Patient understands medications and regiment? Yes Patient understands to bring all medications to this visit? Yes

## 2019-03-19 ENCOUNTER — Telehealth: Payer: Self-pay | Admitting: Physician Assistant

## 2019-03-19 NOTE — Telephone Encounter (Signed)
New Message ° ° ° °Left message to confirm appt and answer covid questions  °

## 2019-03-21 NOTE — Progress Notes (Signed)
Cardiology Office Note:    Date:  03/22/2019   ID:  Veron, Senner 08-17-1929, MRN [id number]  PCP:  Bernerd Limbo, MD  Cardiologist:  [name], MD   Electrophysiologist:  Cristopher Peru, MD   Referring MD: Bernerd Limbo, MD   Chief Complaint  Patient presents with  . Hospitalization Follow-up    Admx with CHF     History of Present Illness:    Levi [name] is a 85 y.o. male with :  Coronary artery disease   S/p CABG in 2011   S/p DES x 2 to LAD in 2013  LHC at [facility] in 6/18: patent LIMA-LAD, diff dz elsewhere.  All VGs occluded. No targets for PCI>>medical Rx    Parox AFib  CHADS2-VASc=5 (age x 2, HTN, CAD, CHF) >> Apixaban 5 mg   Symptomatic bradycardia, Mobitz 2  S/p Pacemaker Jan 6734  Combined systolic and diastolic CHF  Echocardiogram 12/19: EF 40-45 (previously normal) >> Med Rx  Admx 10/2018 w/ DHF  Decomp HF 12/2018 managed as OP with Metolazone  Admx with Decomp CHF 03/2019  Hypertension   Hyperlipidemia   Chronic kidney disease stage 2    Levi [name] was last seen via Telemedicine in April 2020.  He was admitted last week (03/15/2019) from the office with decompensated CHF.  He presented to the office with symptomatic hypotension and weight gain of 8-10 lbs.  He diuresed 3.475 L and DC weight was 276.3 lbs.  He was DC on 03/17/2019 on Torsemide 40 A and 20 P.  Of note, his AT/AF burden on his device was noted to be 47%.    He returns for post hospitalization follow up.  He is here with his granddaughter.  He feels like his fluid status is stable. His weights at home since DC have been 279 >> 280.  He sleeps in a recliner chronically.  He uses CPAP at night.  He has not had chest pain.  His biggest complaint is dizziness.  He feels dizzy when he stands up.  His symptoms improve with sitting.  He has checked his BP at home.  When his systolic pressure is in the 90s, he feels like his dizziness is worse.  He has not had syncope.     Prior CV studies:   The following studies were reviewed today:  Echo 08/28/18 Mild conc LVH, EF 40-45, inf/inf-sept/apical HK, Gr 2 DD, MAC, normal RVSF, mod RAE, increased RA pressure, mild TR  Cardiac catheterization 02/10/2017 ([facility]) Coronary angiography reveals 3-vessel CAD.  LM is diffusely diseased with 50% distal stenosis.  LAD has a 80% mid instent restenosis,  LCX has serial 50-60% stenoses in the mid segment, OM2 is a small vessel and has a 70% ostial stenosis.  RCA has long diffuse 50-60% proximal and 60% distal stenosis.  LIMA to LAD is patent with a kink in the mid segment. Flow is normal.  LV-Gram reveals EF 50%. LVEDP is 8 mmHg.  SVG grafts are known to be chronically occluded, so not injected   Nuclear stress test 01/21/2017 ([facility]) 1. Moderate to severe inferoapical inducible ischemia with regadenoson 2. Mild to moderate mid inferior and posterior basal defect. Cannot rule out prior infarct. 3. Mild to moderate hypokinesis of the inferior wall. Dyskinesia/akinesia of the apex. 4. Moderately decreased left ventricular ejection fraction at 43%. 5. Please correlate with a cardiac ultrasound  Echo 06/20/2016 EF 19-37, grade 1 diastolic dysfunction, MAC, moderate  LAE  Echo 09/28/2015 Mild concentric LVH, EF 55-60, normal wall motion, MAC, mild MR, severe LAE, normal RVSF, mild RAE, mild TR, PASP 41, GLS -14.8%  Exercise tolerance test 09/27/2015 Poor exercise capacity.  Test stopped early as HR 58 >> 33 and patient was weak. ECGs demonstrate transient complete heart block.  Dr. Cristopher Peru saw patient and discussed proceeding with pacemaker implantation. Echo will be arranged.  Carotid US 09/14/2015 Bilateral ICA 1-39; partial left subclavian steal>>follow-up as needed  Nuclear stress test 11/29/2014 Low risk stress nuclear study with a moderate size, medium intensity, fixed inferior/apical  defect consistent with prior infarct; no significant ischemia. LV Ejection Fraction: 56%.  Echo (10/15):  Mild LVH, EF 55-60%, no RWMA, mod LAE, mild TR, PASP 42 mmHg  Nuclear (4/15):  Low risk stress nuclear study with a fixed defect in the apical anterior, inferior walls and in the true apex consistent with distal LAD scar. No ischemia. EF 56%  LHC (12/13):  Dist LM 60-70%, ostial LAD 40-50%, D1 occluded, prox LAD 80% then aneurysmal segment then 80%, ostial septal perforator 99%, prox CFX 50%, OM branch serial 60% lesions, prox RCA 30%, PL Br 50%; LIMA-LAD patent, SVG-LAD occluded, SVG-OM occluded, SVG-PDA occluded >>Med Rx >>continued angina >> PCI (2/14): 2.25 x 32 mm Promus DES and 2.5 x 20 mm Promus DES to prox and mid LAD  Past Medical History:  Diagnosis Date  . Asthma   . Benign neoplasm of colon   . CAD (coronary artery disease) Dec 2011   s/p CABG per Dr. Roxy Manns; had normal EF; All SVGs occluded per follow up cath with only LIMA to LAD patent; s/p PCI of the proximal and mid LAD February 2014 per Dr. Burt Knack  . Chronic combined systolic and diastolic CHF (congestive heart failure) ([place])   . CKD (chronic kidney disease), stage II   . Emphysema    "never treated for it" (10/05/2015)  . History of hiatal hernia   . HLD (hyperlipidemia)   . HTN (hypertension)   . Malaise and fatigue   . Obesity   . Osteopenia   . PAF (paroxysmal atrial fibrillation) ([place])   . Pneumonia 1960s X 1; 1990s X 1  . Presence of permanent cardiac pacemaker   . Prostate cancer ([place])   . Skin cancer    "face, nose, top of ears, scalp"  . Symptomatic bradycardia    STJ PPM, 10/05/15, Dr. Lovena Le   Surgical Hx: The patient  has a past surgical history that includes Median sternotomy (08/23/10); Anterior cervical decomp/discectomy fusion; left heart catheterization with coronary/graft angiogram (N/A, 09/01/2012); percutaneous coronary stent intervention (pci-s) (N/A, 10/14/2012); Insert / replace /  remove pacemaker (10/05/2015); Back surgery; Mohs surgery ("@ least twice"); Prostate biopsy; Transurethral resection of prostate; Coronary artery bypass graft (08/23/10); Coronary angioplasty with stent (10/14/12); Cataract extraction (Right); Pacemaker insertion; Cardiac catheterization (N/A, 10/05/2015); Cardiac catheterization (N/A, 10/06/2015); Eye surgery; and c-eye surgery procedure.   Current Medications: Current Meds  Medication Sig  . apixaban (ELIQUIS) 5 MG TABS tablet Take 1 tablet (5 mg total) by mouth 2 (two) times daily.  . carvedilol (COREG) 6.25 MG tablet TAKE 1 TABLET BY MOUTH  TWICE DAILY  . cetirizine (ZYRTEC) 10 MG tablet Take 10 mg by mouth daily.  . Melatonin 5 MG TABS Take 5 mg by mouth as needed (sleep).   . nitroGLYCERIN (NITROSTAT) 0.4 MG SL tablet Place 0.4 mg under the tongue every 5 (five) minutes x 3 doses as needed for chest  pain.  . polyethylene glycol (MIRALAX / GLYCOLAX) 17 g packet Take 17 g by mouth daily.  . potassium chloride SA (K-DUR) 20 MEQ tablet Take 20 mEq by mouth 2 (two) times daily.  . pravastatin (PRAVACHOL) 20 MG tablet TAKE 1 TABLET BY MOUTH  DAILY  . tamsulosin (FLOMAX) 0.4 MG CAPS capsule Take 0.4 mg by mouth daily.  Marland Kitchen torsemide (DEMADEX) 20 MG tablet Take 20-40 mg by mouth See admin instructions. Take two tablets by mouth in the AM and one tablet by mouth in the PM     Allergies:   Patient has no known allergies.   Social History   Tobacco Use  . Smoking status: Former Smoker    Packs/day: 1.00    Years: 30.00    Pack years: 30.00    Types: Cigarettes    Quit date: 09/07/1975    Years since quitting: 43.5  . Smokeless tobacco: Former Systems developer    Types: Chew    Quit date: 07/27/2015  Substance Use Topics  . Alcohol use: No    Alcohol/week: 0.0 standard drinks  . Drug use: No     Family Hx: The patient's family history includes Heart attack in his father; Heart disease in his brother; Hypertension in his mother; Kidney cancer in his  father; Prostate cancer in his brother; Skin cancer in his brother; Stroke in his mother. There is no history of Allergic rhinitis, Asthma, Eczema, or Urticaria.  ROS:   Please see the history of present illness.    ROS All other systems reviewed and are negative.   EKGs/Labs/Other Test Reviewed:    EKG:  EKG is  ordered today.  The ekg ordered today demonstrates AV paced, HR 70  Recent Labs: 09/11/2018: NT-Pro BNP 2,599 10/06/2018: Magnesium 2.4; TSH 7.828 03/15/2019: ALT 14; B Natriuretic Peptide 107.0; Hemoglobin 13.2; Platelets 193 03/17/2019: BUN 24; Creatinine, Ser 1.36; Potassium 3.7; Sodium 139   Recent Lipid Panel Lab Results  Component Value Date/Time   CHOL 113 06/01/2014 09:30 AM   TRIG 115.0 06/01/2014 09:30 AM   HDL 32.40 (L) 06/01/2014 09:30 AM   CHOLHDL 3 06/01/2014 09:30 AM   LDLCALC 58 06/01/2014 09:30 AM   LDLDIRECT 70.7 06/10/2013 01:44 PM    Physical Exam:    VS:  BP 110/80   Pulse 70   Ht _0  (1.778 m)   Wt 285 lb (129.3 kg)   SpO2 95%   BMI 40.89 kg/m     Wt Readings from Last 3 Encounters:  03/22/19 285 lb (129.3 kg)  03/17/19 276 lb 4.8 oz (125.3 kg)  03/15/19 288 lb (130.6 kg)     Physical Exam  Constitutional: He is oriented to person, place, and time. He appears well-developed and well-nourished. No distress.  HENT:  Head: Normocephalic and atraumatic.  Eyes: No scleral icterus.  Neck: No JVD present. No thyromegaly present.  Cardiovascular: Normal rate, regular rhythm and normal heart sounds.  No murmur heard. Pulmonary/Chest: Effort normal. He has decreased breath sounds. He has no wheezes.  Faint dry crackles in the bases, partially cleared with coughing  Abdominal: Soft. There is no hepatomegaly.  Musculoskeletal:        General: Edema (trace bilat LE edema with brawny changes) present.  Lymphadenopathy:    He has no cervical adenopathy.  Neurological: He is alert and oriented to person, place, and time.  Skin: Skin is warm and  dry.  Psychiatric: He has a normal mood and affect.    ASSESSMENT &  PLAN:    1. Chronic combined systolic and diastolic CHF (congestive heart failure) (HCC) EF 23-76 with mod diastolic dysfunction by echocardiogram in Dec 2019.  He was recently admitted with decompensated congestive heart failure.  He does note that he eats [place] bacon on a regular basis.  He does not add salt to food.  He and his granddaughter had questions today regarding measures he can take at home to avoid volume excess.  We discussed limiting his fluid intake to 60 oz or less per day and limiting his salt intake.  He mainly complains of dizziness and this seems to be all related to low BP. He had orthostatic VS in the hospital with no BP change but his HR did increase somewhat.  He has recorded lower BPs at home with worsening symptoms.  His volume seems stable at this time.  I do not think he is over-diuresed.  I am hesitant to reduce his Torsemide as he would be at risk for volume excess.  I therefore recommend reducing his Coreg.  I will also obtain a follow up BMET.  If his Creatinine is higher, I will also cut back on his Torsemide.  We discussed continuing to weigh daily and when to call.  -Continue current dose of Torsemide  -BMET today  -Reduce Coreg to 3.125 mg in A and 6.25 mg in P  -With BP issues, we will not try to add ACE/ARB, hydralazine, nitrates   2. Dizziness As noted, his BP is running low at times.  He has mainly orthostatic symptoms.  I will reduce his Coreg as outlined above.  If his Creatinine is higher, I will also reduce his Torsemide.  I have asked him to wear compression stockings to help with this symptoms as well.    3. Coronary artery disease involving native coronary artery of native heart with angina pectoris ([place]) History of CABG in 2011 and drug-eluting stent x2 to the LAD in 2013. [facility]-L[facility] and diffuse disease elsewhere.  All vein grafts were known to be occluded. There were no targets for PCI. Medical therapy was recommended.  He is doing well without angina.  Continue beta-blocker, statin.  He is not on ASA as he is on Apixaban.    4. Essential hypertension BP running low at times.  Adjust Coreg as noted.   5. PAF (paroxysmal atrial fibrillation) (HCC) 47% AF burden on recent device interrogation.  Continue Apixaban.  Weight > 60 kg and Creatinine < 1.5.  Continue 5 mg dose.   6. CKD (chronic kidney disease) stage 2, GFR 60-89 ml/min Repeat BMET today.   7. Presence of permanent cardiac pacemaker FU with EP as planned.     Dispo:  Return in 6 weeks (on 05/03/2019) for Scheduled Follow Up, w/ Richardson Dopp, PA-C.   Medication Adjustments/Labs and Tests Ordered: Current medicines are reviewed at length with the patient today.  Concerns regarding medicines are outlined above.  Tests Ordered: Orders Placed This Encounter  Procedures  . Basic metabolic panel  . EKG 12-Lead   Medication Changes: No orders of the defined types were placed in this encounter.   Signed, Richardson Dopp, PA-C  03/22/2019 2:57 PM    [facility] [address], [place], [place]  [address] Phone: [phone]; Fax: [phone]

## 2019-03-22 ENCOUNTER — Ambulatory Visit (INDEPENDENT_AMBULATORY_CARE_PROVIDER_SITE_OTHER): Payer: Medicare Other | Admitting: Physician Assistant

## 2019-03-22 ENCOUNTER — Other Ambulatory Visit: Payer: Self-pay

## 2019-03-22 ENCOUNTER — Encounter: Payer: Self-pay | Admitting: Physician Assistant

## 2019-03-22 VITALS — BP 110/80 | HR 70 | Ht 70.0 in | Wt 285.0 lb

## 2019-03-22 DIAGNOSIS — I25119 Atherosclerotic heart disease of native coronary artery with unspecified angina pectoris: Secondary | ICD-10-CM

## 2019-03-22 DIAGNOSIS — I5042 Chronic combined systolic (congestive) and diastolic (congestive) heart failure: Secondary | ICD-10-CM

## 2019-03-22 DIAGNOSIS — I10 Essential (primary) hypertension: Secondary | ICD-10-CM

## 2019-03-22 DIAGNOSIS — R42 Dizziness and giddiness: Secondary | ICD-10-CM

## 2019-03-22 DIAGNOSIS — Z950 Presence of cardiac pacemaker: Secondary | ICD-10-CM

## 2019-03-22 DIAGNOSIS — N182 Chronic kidney disease, stage 2 (mild): Secondary | ICD-10-CM

## 2019-03-22 DIAGNOSIS — I480 Paroxysmal atrial fibrillation: Secondary | ICD-10-CM

## 2019-03-22 NOTE — Patient Instructions (Signed)
Medication Instructions:  Decrease Coreg (Carvedilol):  - Take 1/2 tablet (3.125 mg) in the morning and take 1 tablet (6.25 mg) in the evening  - Call if your dizziness is not improved with this  If you need a refill on your cardiac medications before your next appointment, please call your pharmacy.   Lab work: Today - BMET  If you have labs (blood work) drawn today and your tests are completely normal, you will receive your results only by:  [place] (if you have MyChart) OR  A paper copy in the mail If you have any lab test that is abnormal or we need to change your treatment, we will call you to review the results.  Testing/Procedures: None   Follow-Up: At [facility], you and your health needs are our priority.  As part of our continuing mission to provide you with exceptional heart care, we have created designated Provider Care Teams.  These Care Teams include your primary Cardiologist (physician) and Advanced Practice Providers (APPs -  Physician Assistants and Nurse Practitioners) who all work together to provide you with the care you need, when you need it.  Richardson Dopp, PA-C as scheduled on May 03, 2019  Any Other Special Instructions Will Be Listed Below (If Applicable).   Wear compression socks or stockings (knee high) daily - this should help with preventing dizziness when you stand  - You can get compression stockings in [place] at [employer]  - Phone # [phone].    Limit salt in your diet to 2 grams of sodium or less in 1 day (see below)   Limit fluid intake to 60 ounces or less per day   Remember to stand up slowly    Low-Sodium Eating Plan Sodium, which is an element that makes up salt, helps you maintain a healthy balance of fluids in your body. Too much sodium can increase your blood pressure and cause fluid and waste to be held in your body. Your health care provider or dietitian may recommend following this plan if you have  high blood pressure (hypertension), kidney disease, liver disease, or heart failure. Eating less sodium can help lower your blood pressure, reduce swelling, and protect your heart, liver, and kidneys. What are tips for following this plan? General guidelines  Most people on this plan should limit their sodium intake to 1,500-2,000 mg (milligrams) of sodium each day. Reading food labels   The Nutrition Facts label lists the amount of sodium in one serving of the food. If you eat more than one serving, you must multiply the listed amount of sodium by the number of servings.  Choose foods with less than 140 mg of sodium per serving.  Avoid foods with 300 mg of sodium or more per serving. Shopping  Look for lower-sodium products, often labeled as "low-sodium" or "no salt added."  Always check the sodium content even if foods are labeled as "unsalted" or "no salt added".  Buy fresh foods. ? Avoid canned foods and premade or frozen meals. ? Avoid canned, cured, or processed meats  Buy breads that have less than 80 mg of sodium per slice. Cooking  Eat more home-cooked food and less restaurant, buffet, and fast food.  Avoid adding salt when cooking. Use salt-free seasonings or herbs instead of table salt or sea salt. Check with your health care provider or pharmacist before using salt substitutes.  Cook with plant-based oils, such as canola, sunflower, or olive oil. Meal planning  When eating  at a restaurant, ask that your food be prepared with less salt or no salt, if possible.  Avoid foods that contain MSG (monosodium glutamate). MSG is sometimes added to [place] food, bouillon, and some canned foods. What foods are recommended? The items listed may not be a complete list. Talk with your dietitian about what dietary choices are best for you. Grains Low-sodium cereals, including oats, puffed wheat and rice, and shredded wheat. Low-sodium crackers. Unsalted rice. Unsalted pasta.  Low-sodium bread. Whole-grain breads and whole-grain pasta. Vegetables Fresh or frozen vegetables. "No salt added" canned vegetables. "No salt added" tomato sauce and paste. Low-sodium or reduced-sodium tomato and vegetable juice. Fruits Fresh, frozen, or canned fruit. Fruit juice. Meats and other protein foods Fresh or frozen (no salt added) meat, poultry, seafood, and fish. Low-sodium canned tuna and salmon. Unsalted nuts. Dried peas, beans, and lentils without added salt. Unsalted canned beans. Eggs. Unsalted nut butters. Dairy Milk. Soy milk. Cheese that is naturally low in sodium, such as ricotta cheese, fresh mozzarella, or Swiss cheese Low-sodium or reduced-sodium cheese. Cream cheese. Yogurt. Fats and oils Unsalted butter. Unsalted margarine with no trans fat. Vegetable oils such as canola or olive oils. Seasonings and other foods Fresh and dried herbs and spices. Salt-free seasonings. Low-sodium mustard and ketchup. Sodium-free salad dressing. Sodium-free light mayonnaise. Fresh or refrigerated horseradish. Lemon juice. Vinegar. Homemade, reduced-sodium, or low-sodium soups. Unsalted popcorn and pretzels. Low-salt or salt-free chips. What foods are not recommended? The items listed may not be a complete list. Talk with your dietitian about what dietary choices are best for you. Grains Instant hot cereals. Bread stuffing, pancake, and biscuit mixes. Croutons. Seasoned rice or pasta mixes. Noodle soup cups. Boxed or frozen macaroni and cheese. Regular salted crackers. Self-rising flour. Vegetables Sauerkraut, pickled vegetables, and relishes. Olives. [place] fries. Onion rings. Regular canned vegetables (not low-sodium or reduced-sodium). Regular canned tomato sauce and paste (not low-sodium or reduced-sodium). Regular tomato and vegetable juice (not low-sodium or reduced-sodium). Frozen vegetables in sauces. Meats and other protein foods Meat or fish that is salted, canned, smoked, spiced,  or pickled. Bacon, ham, sausage, hotdogs, corned beef, chipped beef, packaged lunch meats, salt pork, jerky, pickled herring, anchovies, regular canned tuna, sardines, salted nuts. Dairy Processed cheese and cheese spreads. Cheese curds. Blue cheese. Feta cheese. String cheese. Regular cottage cheese. Buttermilk. Canned milk. Fats and oils Salted butter. Regular margarine. Ghee. Bacon fat. Seasonings and other foods Onion salt, garlic salt, seasoned salt, table salt, and sea salt. Canned and packaged gravies. Worcestershire sauce. Tartar sauce. Barbecue sauce. Teriyaki sauce. Soy sauce, including reduced-sodium. Steak sauce. Fish sauce. Oyster sauce. Cocktail sauce. Horseradish that you find on the shelf. Regular ketchup and mustard. Meat flavorings and tenderizers. Bouillon cubes. Hot sauce and Tabasco sauce. Premade or packaged marinades. Premade or packaged taco seasonings. Relishes. Regular salad dressings. Salsa. Potato and tortilla chips. Corn chips and puffs. Salted popcorn and pretzels. Canned or dried soups. Pizza. Frozen entrees and pot pies. Summary  Eating less sodium can help lower your blood pressure, reduce swelling, and protect your heart, liver, and kidneys.  Most people on this plan should limit their sodium intake to 1,500-2,000 mg (milligrams) of sodium each day.  Canned, boxed, and frozen foods are high in sodium. Restaurant foods, fast foods, and pizza are also very high in sodium. You also get sodium by adding salt to food.  Try to cook at home, eat more fresh fruits and vegetables, and eat less fast food, canned, processed, or  prepared foods. This information is not intended to replace advice given to you by your health care provider. Make sure you discuss any questions you have with your health care provider. Document Released: 02/08/2002 Document Revised: 08/01/2017 Document Reviewed: 08/12/2016 Elsevier Patient Education  2020 [employer].

## 2019-03-23 ENCOUNTER — Ambulatory Visit (INDEPENDENT_AMBULATORY_CARE_PROVIDER_SITE_OTHER): Payer: Medicare Other | Admitting: *Deleted

## 2019-03-23 DIAGNOSIS — I442 Atrioventricular block, complete: Secondary | ICD-10-CM | POA: Diagnosis not present

## 2019-03-23 LAB — CUP PACEART REMOTE DEVICE CHECK
Date Time Interrogation Session: 20200721121606
Implantable Lead Implant Date: 20170202
Implantable Lead Implant Date: 20170202
Implantable Lead Location: 753859
Implantable Lead Location: 753860
Implantable Pulse Generator Implant Date: 20170202
Pulse Gen Model: 2240
Pulse Gen Serial Number: 7864186

## 2019-03-23 LAB — BASIC METABOLIC PANEL
BUN/Creatinine Ratio: 21 (ref 10–24)
BUN: 26 mg/dL (ref 8–27)
CO2: 27 mmol/L (ref 20–29)
Calcium: 9.2 mg/dL (ref 8.6–10.2)
Chloride: 94 mmol/L — ABNORMAL LOW (ref 96–106)
Creatinine, Ser: 1.21 mg/dL (ref 0.76–1.27)
GFR calc Af Amer: 61 mL/min/{1.73_m2} (ref >59)
GFR calc non Af Amer: 53 mL/min/{1.73_m2} — ABNORMAL LOW (ref >59)
Glucose: 96 mg/dL (ref 65–99)
Potassium: 4.2 mmol/L (ref 3.5–5.2)
Sodium: 137 mmol/L (ref 134–144)

## 2019-03-24 NOTE — Telephone Encounter (Signed)
See message from/to patient. Reduce Coreg to 3.125 mg twice daily. Richardson Dopp, PA-C    03/24/2019 1:36 PM

## 2019-03-25 ENCOUNTER — Ambulatory Visit: Payer: Medicare Other | Admitting: Physician Assistant

## 2019-04-08 ENCOUNTER — Encounter: Payer: Self-pay | Admitting: Cardiology

## 2019-04-08 NOTE — Progress Notes (Signed)
Remote pacemaker transmission.   

## 2019-04-14 ENCOUNTER — Telehealth: Payer: Self-pay | Admitting: *Deleted

## 2019-04-14 NOTE — Telephone Encounter (Signed)
Ok.  Thank you. Richardson Dopp, PA-C    04/14/2019 3:58 PM

## 2019-04-14 NOTE — Telephone Encounter (Signed)
S/w pt's daughter [name] per(DPR) is aware of Scott's recommendation's.  Pt has taken am dose of Coreg and will D/C pm dose than stop, medication list updated. Pt's daughter will check on SOB and swelling to decide about pm dose of torsemide.  Pt's daughter did state pt has on compression hose and pt does not tell the truth about symptoms.  Daughter is going to get other sister to go to pt's house to assess.  Daughter will send Nicki Reaper a Pharmacist, community message in the am.  Will send to [place] to [place].

## 2019-04-14 NOTE — Telephone Encounter (Signed)
Please call the patient and his daughter to discuss his symptoms. Richardson Dopp, PA-C    04/14/2019 1:54 PM

## 2019-04-14 NOTE — Telephone Encounter (Signed)
Please call the patient. He can take Coreg just once today.  Then, stop Coreg. Ask if he is having any increased swelling or increased shortness of breath.  If no, he can hold his PM dose of Torsemide today. Keep an eye on his BP and call tomorrow with update on his symptoms and BP. Richardson Dopp, PA-C    04/14/2019 2:58 PM

## 2019-04-15 ENCOUNTER — Telehealth: Payer: Self-pay | Admitting: Interventional Cardiology

## 2019-04-15 ENCOUNTER — Encounter: Payer: Self-pay | Admitting: *Deleted

## 2019-04-15 NOTE — Progress Notes (Addendum)
Cardiology Office Note:    Date:  04/16/2019   ID:  Levi [name], DOB 1929/02/19, MRN [id number]  PCP:  Bernerd Limbo, MD  Cardiologist:  [name], MD   Referring MD: Bernerd Limbo, MD   Chief Complaint  Patient presents with   Atrial Fibrillation   Congestive Heart Failure    History of Present Illness:    Levi [name] is a 85 y.o. male with a hx of h/o coronary artery disease status post CABG in 2011 and drug-eluting stent x2 to the LAD in 2013, paroxysmal atrial fibrillation, symptomatic bradycardia with Mobitz 2 status post pacemaker implantation in January 8250, combined systolic and diastolic heart failure, hypertension, hyperlipidemia, CKD stage II and OSA on CPAP. He presented to office with fluctuating BP, dizzy spells, and weight gain of 8-10 lb. He was admitted for concern for acute on chronic combined CHF - not felt to be good candidate for outpatient therapy given complaints of orthostasis as well.   New to me today as DOD.  Complicated prior history.  Seen by multiple people with recent hospital stay after being admitted from the office in July.  Major complaint has been what appears to be CHF.  Real tight hemodynamic status with hypotension if too much fluid is removed and significant shortness of breath if slightly volume overloaded.  Over the past 48 hours multiple phone calls to [employer] have led to medication adjustments based upon blood pressure recordings at home.  The concern has been that blood pressure is low.  Pressures have run around 90 mmHg systolic.  The most recent instruction to the patient was to hold torsemide last evening and 2 days ago carvedilol was completely discontinued.  Today he states that shortness of breath is present and is about the way that it was when recently admitted.  His blood pressure has been higher carvedilol and torsemide were held.  He was sent to see DOD after multiple phone calls and more shortness of breath  today.  He sleeps on a recliner, for the past 3 years.  Past Medical History:  Diagnosis Date   Asthma    Benign neoplasm of colon    CAD (coronary artery disease) Dec 2011   s/p CABG per Dr. Roxy Manns; had normal EF; All SVGs occluded per follow up cath with only LIMA to LAD patent; s/p PCI of the proximal and mid LAD February 2014 per Dr. Burt Knack   Chronic combined systolic and diastolic CHF (congestive heart failure) ([place])    CKD (chronic kidney disease), stage II    Emphysema    "never treated for it" (10/05/2015)   History of hiatal hernia    HLD (hyperlipidemia)    HTN (hypertension)    Malaise and fatigue    Obesity    Osteopenia    PAF (paroxysmal atrial fibrillation) (HCC)    Pneumonia 1960s X 1; 1990s X 1   Presence of permanent cardiac pacemaker    Prostate cancer ([place])    Skin cancer    "face, nose, top of ears, scalp"   Symptomatic bradycardia    STJ PPM, 10/05/15, Dr. Lovena Le    Past Surgical History:  Procedure Laterality Date   ANTERIOR CERVICAL DECOMP/DISCECTOMY FUSION     BACK SURGERY     C-EYE SURGERY PROCEDURE     CATARACT EXTRACTION Right    CORONARY ANGIOPLASTY WITH STENT PLACEMENT  10/14/12   with stent to proximal and mid LAD   CORONARY ARTERY BYPASS GRAFT  08/23/10   "  CABG X4"   EP IMPLANTABLE DEVICE N/A 10/05/2015   Procedure: Pacemaker Implant;  Surgeon: [name], MD;  Location: [place];  Service: Cardiovascular;  Laterality: N/A;   EP IMPLANTABLE DEVICE N/A 10/06/2015   Procedure: Lead Revision/Repair;  Surgeon: [name], MD;  Location: [facility] [facility];  Service: Cardiovascular;  Laterality: N/A;   EYE SURGERY     INSERT / REPLACE / REMOVE PACEMAKER  10/05/2015   LEFT HEART CATHETERIZATION WITH CORONARY/GRAFT ANGIOGRAM N/A 09/01/2012   Procedure: LEFT HEART CATHETERIZATION WITH [name];  Surgeon: [name], MD;  Location: [facility] CATH LAB;  Service: Cardiovascular;  Laterality: N/A;    MEDIAN STERNOTOMY  08/23/10   MOHS SURGERY  "@ least twice"   PACEMAKER INSERTION     STJ 10/05/15   PERCUTANEOUS CORONARY STENT INTERVENTION (PCI-S) N/A 10/14/2012   Procedure: PERCUTANEOUS CORONARY STENT INTERVENTION (PCI-S);  Surgeon: [name], MD;  Location: [facility] CATH LAB;  Service: Cardiovascular;  Laterality: N/A;   PROSTATE BIOPSY     TRANSURETHRAL RESECTION OF PROSTATE      Current Medications: Current Meds  Medication Sig   apixaban (ELIQUIS) 5 MG TABS tablet Take 1 tablet (5 mg total) by mouth 2 (two) times daily.   cetirizine (ZYRTEC) 10 MG tablet Take 10 mg by mouth daily.   Melatonin 5 MG TABS Take 5 mg by mouth as needed (sleep).    nitroGLYCERIN (NITROSTAT) 0.4 MG SL tablet Place 0.4 mg under the tongue every 5 (five) minutes x 3 doses as needed for chest pain.   polyethylene glycol (MIRALAX / GLYCOLAX) 17 g packet Take 17 g by mouth daily.   potassium chloride SA (K-DUR) 20 MEQ tablet Take 20 mEq by mouth daily.    pravastatin (PRAVACHOL) 20 MG tablet TAKE 1 TABLET BY MOUTH  DAILY   tamsulosin (FLOMAX) 0.4 MG CAPS capsule Take 0.4 mg by mouth daily.   torsemide (DEMADEX) 20 MG tablet Take 20-40 mg by mouth See admin instructions. Take two tablets by mouth in the AM     Allergies:   Patient has no known allergies.   Social History   Socioeconomic History   Marital status: Widowed    Spouse name: Not on file   Number of children: Not on file   Years of education: Not on file   Highest education level: Not on file  Occupational History   Occupation: retired Information systems manager: [employer] resource strain: Not on file   Food insecurity    Worry: Not on file    Inability: Not on file   Transportation needs    Medical: Not on file    Non-medical: Not on file  Tobacco Use   Smoking status: Former Smoker    Packs/day: 1.00    Years: 30.00    Pack years: 30.00    Types: Cigarettes    Quit date: 09/07/1975     Years since quitting: 43.6   Smokeless tobacco: Former Systems developer    Types: [place] date: 07/27/2015  Substance and Sexual Activity   Alcohol use: No    Alcohol/week: 0.0 standard drinks   Drug use: No   Sexual activity: Not Currently  Lifestyle   Physical activity    Days per week: Not on file    Minutes per session: Not on file   Stress: Not on file  Relationships   Social connections    Talks on  phone: Not on file    Gets together: Not on file    Attends religious service: Not on file    Active member of club or organization: Not on file    Attends meetings of clubs or organizations: Not on file    Relationship status: Not on file  Other Topics Concern   Not on file  Social History Narrative   Not on file     Family History: The patient's family history includes Heart attack in his father; Heart disease in his brother; Hypertension in his mother; Kidney cancer in his father; Prostate cancer in his brother; Skin cancer in his brother; Stroke in his mother. There is no history of Allergic rhinitis, Asthma, Eczema, or Urticaria.  ROS:   Please see the history of present illness.    Poor understanding of his underlying medical condition.  Poor diet.  Has difficulty controlling salt intake.  All other systems reviewed and are negative.  EKGs/Labs/Other Studies Reviewed:    The following studies were reviewed today: Echo 08/28/18 Mild conc LVH, EF 40-45, inf/inf-sept/apical HK, Gr 2 DD, MAC, normal RVSF, mod RAE, increased RA pressure, mild TR  Cardiac catheterization 02/10/2017 ([facility]) Coronary angiography reveals 3-vessel CAD.  LM is diffusely diseased with 50% distal stenosis.  LAD has a 80% mid instent restenosis,  LCX has serial 50-60% stenoses in the mid segment, OM2 is a small vessel and has a 70% ostial stenosis.  RCA has long diffuse 50-60% proximal and 60% distal stenosis.  LIMA to LAD is patent with a kink in the mid  segment. Flow is normal.  LV-Gram reveals EF 50%. LVEDP is 8 mmHg.  SVG grafts are known to be chronically occluded, so not injected   Nuclear stress test 01/21/2017 ([facility]) 1. Moderate to severe inferoapical inducible ischemia with regadenoson 2. Mild to moderate mid inferior and posterior basal defect. Cannot rule out prior infarct. 3. Mild to moderate hypokinesis of the inferior wall. Dyskinesia/akinesia of the apex. 4. Moderately decreased left ventricular ejection fraction at 43%. 5. Please correlate with a cardiac ultrasound  Echo 06/20/2016 EF 03-47, grade 1 diastolic dysfunction, MAC, moderate LAE  Echo 09/28/2015 Mild concentric LVH, EF 55-60, normal wall motion, MAC, mild MR, severe LAE, normal RVSF, mild RAE, mild TR, PASP 41, GLS -14.8%  Exercise tolerance test 09/27/2015 Poor exercise capacity.  Test stopped early as HR 58 >> 33 and patient was weak. ECGs demonstrate transient complete heart block.  Dr. Cristopher Peru saw patient and discussed proceeding with pacemaker implantation. Echo will be arranged.  Carotid US 09/14/2015 Bilateral ICA 1-39; partial left subclavian steal>>follow-up as needed  Nuclear stress test 11/29/2014 Low risk stress nuclear study with a moderate size, medium intensity, fixed inferior/apical defect consistent with prior infarct; no significant ischemia. LV Ejection Fraction: 56%.  Echo (10/15):  Mild LVH, EF 55-60%, no RWMA, mod LAE, mild TR, PASP 42 mmHg  Nuclear (4/15):  Low risk stress nuclear study with a fixed defect in the apical anterior, inferior walls and in the true apex consistent with distal LAD scar. No ischemia. EF 56%  LHC (12/13):  Dist LM 60-70%, ostial LAD 40-50%, D1 occluded, prox LAD 80% then aneurysmal segment then 80%, ostial septal perforator 99%, prox CFX 50%, OM branch serial 60% lesions, prox RCA 30%, PL Br 50%; LIMA-LAD patent, SVG-LAD occluded, SVG-OM occluded, SVG-PDA  occluded >>Med Rx >>continued angina >> PCI (2/14): 2.25 x 32 mm Promus DES and 2.5  x 20 mm Promus DES to prox and mid LAD  EKG:  EKG not repeated  Recent Labs: 09/11/2018: NT-Pro BNP 2,599 10/06/2018: Magnesium 2.4; TSH 7.828 03/15/2019: ALT 14; B Natriuretic Peptide 107.0; Hemoglobin 13.2; Platelets 193 03/22/2019: BUN 26; Creatinine, Ser 1.21; Potassium 4.2; Sodium 137  Recent Lipid Panel    Component Value Date/Time   CHOL 113 06/01/2014 0930   TRIG 115.0 06/01/2014 0930   HDL 32.40 (L) 06/01/2014 0930   CHOLHDL 3 06/01/2014 0930   VLDL 23.0 06/01/2014 0930   LDLCALC 58 06/01/2014 0930   LDLDIRECT 70.7 06/10/2013 1344    Physical Exam:    VS:  BP 128/64    Pulse 73    Ht _0  (1.778 m)    Wt 290 lb 6.4 oz (131.7 kg)    SpO2 95%    BMI 41.67 kg/m     Wt Readings from Last 3 Encounters:  04/16/19 290 lb 6.4 oz (131.7 kg)  03/22/19 285 lb (129.3 kg)  03/17/19 276 lb 4.8 oz (125.3 kg)     GEN: Morbidly obese elderly gentleman. No acute distress HEENT: Normal NECK: No JVD with the patient sitting on a chair.  He is unable to get on the exam table. LYMPHATICS: No lymphadenopathy CARDIAC: No tachycardia.  RRR without murmur, gallop, or edema. VASCULAR:  Normal Pulses. No bruits. RESPIRATORY:  Clear to auscultation without rales, wheezing or rhonchi  ABDOMEN: Soft, non-tender, non-distended, No pulsatile mass, MUSCULOSKELETAL: No deformity  SKIN: Warm and dry NEUROLOGIC:  Alert and oriented x 3 PSYCHIATRIC:  Normal affect   ASSESSMENT:    1. Chronic combined systolic and diastolic CHF (congestive heart failure) ([place])   2. Coronary artery disease involving native coronary artery of native heart with angina pectoris ([place])   3. Essential hypertension   4. PAF (paroxysmal atrial fibrillation) ([place])   5. CKD (chronic kidney disease) stage 2, GFR 60-89 ml/min   6. Educated About Covid-19 Virus Infection    PLAN:    In order of problems listed above:  1. Plan to  increase torsemide to 40 mg twice daily.  We will not resume beta-blocker therapy to give [place] room to adequately diurese the patient.  Plan to have him return to the office in 3 to 5 days for reassessment.  Hopefully this can be done by his primary cardiologist, Dr. Harrington Challenger.  He will need to have a basic metabolic panel done at that time.  The current potassium dose will be maintained.  BMET today. 2. I do not believe that this is an ischemic issue. 3. Blood pressure is improved off carvedilol. 4. He has pacemaker therapy for AV conduction system disease and despite discontinuing carvedilol, he is not tachycardic. 5. Kidney function is not reassessed today.  Last creatinine in mid July.  A basic metabolic panel will be done today. 6. Educated concerning COVID-19 and requested that he observe social distancing, wears a mask, and washes his hands frequently.  Greater than 50% of the time during this office visit was spent in education, counseling, and coordination of care related to underlying disease process and testing as outlined.  Continue to monitor blood pressures at home and alert [place] if they run high, above 062 mmHg systolic or low, below 90 mmHg systolic.  Also weigh daily and inform us if gradual weight gain is noted.  Medication Adjustments/Labs and Tests Ordered: Current medicines are reviewed at length with the patient today.  Concerns regarding medicines are outlined above.  No  orders of the defined types were placed in this encounter.  No orders of the defined types were placed in this encounter.   There are no Patient Instructions on file for this visit.   Signed, Sinclair Grooms, MD  04/16/2019 3:09 PM    [place]

## 2019-04-15 NOTE — Telephone Encounter (Signed)
New Message:    Pt s scheduled to see Dr Tamala Julian on tomorrow(04-16-19). His son, Balthazar Dooly wants to come in with him please. Pt is hard of hearing and does not understand what is being said to him. Please let his daughter, [name] know if he can come in with him please.

## 2019-04-15 NOTE — Telephone Encounter (Signed)
Please call patient's daughter. He should remain off of the Carvedilol. Hold Torsemide today >> resume tomorrow at Torsemide 40 mg in A and reduce K+ to 20 mEq once daily. Weigh daily and call if weight increases more than 3 lbs in 1 day. See if he can wear compression stockings/socks. Can you see if there is any way to bring him in to be seen?  Dr. Radford Pax may have 1 opening this afternoon.  I am not sure why his BP is running so low.   Richardson Dopp, PA-C    04/15/2019 8:54 AM

## 2019-04-15 NOTE — Telephone Encounter (Signed)
S/w pt's  daughter advised pt to hold coreg, torsemide and pot evening doses. Pt has already taken am dose.  Pt is to take torsemide and potassium in the am per [place].  Pt will see Dr. Tamala Julian tomorrow, Friday at 2:20.  Pt has two appt in office same day if this could be addressed tomorrow.

## 2019-04-15 NOTE — Telephone Encounter (Signed)
Spoke with [name] and she said pt is very HOH, will nod like he understands but then when you ask him what was said he doesn't know.  She also mentioned he gets confused with medication changes sometimes.  Advised ok for [name] to come up.  Called [name] and screened him for COVID virus.      COVID-19 Pre-Screening Questions:  . In the past 7 to 10 days have you had a cough,  shortness of breath, headache, congestion, fever (100 or greater) body aches, chills, sore throat, or sudden loss of taste or sense of smell? No . Have you been around anyone with known Covid 19? No . Have you been around anyone who is awaiting Covid 19 test results in the past 7 to 10 days? No . Have you been around anyone who has been exposed to Covid 19, or has mentioned symptoms of Covid 19 within the past 7 to 10 days? No  If you have any concerns/questions about symptoms patients report during screening (either on the phone or at [facility]). Contact the provider seeing the patient or DOD for further guidance.  If neither are available contact a member of the leadership team.

## 2019-04-16 ENCOUNTER — Telehealth: Payer: Self-pay

## 2019-04-16 ENCOUNTER — Other Ambulatory Visit: Payer: Self-pay

## 2019-04-16 ENCOUNTER — Ambulatory Visit (INDEPENDENT_AMBULATORY_CARE_PROVIDER_SITE_OTHER): Payer: Medicare Other | Admitting: Interventional Cardiology

## 2019-04-16 ENCOUNTER — Encounter: Payer: Self-pay | Admitting: Interventional Cardiology

## 2019-04-16 VITALS — BP 128/64 | HR 73 | Ht 70.0 in | Wt 290.4 lb

## 2019-04-16 DIAGNOSIS — Z7901 Long term (current) use of anticoagulants: Secondary | ICD-10-CM

## 2019-04-16 DIAGNOSIS — N182 Chronic kidney disease, stage 2 (mild): Secondary | ICD-10-CM

## 2019-04-16 DIAGNOSIS — I480 Paroxysmal atrial fibrillation: Secondary | ICD-10-CM

## 2019-04-16 DIAGNOSIS — Z7189 Other specified counseling: Secondary | ICD-10-CM

## 2019-04-16 DIAGNOSIS — I10 Essential (primary) hypertension: Secondary | ICD-10-CM

## 2019-04-16 DIAGNOSIS — Z87891 Personal history of nicotine dependence: Secondary | ICD-10-CM

## 2019-04-16 DIAGNOSIS — I130 Hypertensive heart and chronic kidney disease with heart failure and stage 1 through stage 4 chronic kidney disease, or unspecified chronic kidney disease: Secondary | ICD-10-CM

## 2019-04-16 DIAGNOSIS — I5042 Chronic combined systolic (congestive) and diastolic (congestive) heart failure: Secondary | ICD-10-CM

## 2019-04-16 DIAGNOSIS — I25119 Atherosclerotic heart disease of native coronary artery with unspecified angina pectoris: Secondary | ICD-10-CM | POA: Diagnosis not present

## 2019-04-16 MED ORDER — TORSEMIDE 20 MG PO TABS: 40.0000 mg | ORAL_TABLET | Freq: Two times a day (BID) | ORAL | 0 refills | Status: DC

## 2019-04-16 NOTE — Telephone Encounter (Signed)
Pt came in for appt with Dr Tamala Julian today (DOD) He stated that he was in the donut hole and is having a hard time affording Eliquis. I gave him samples (2 boxes) and printed out a Pt Asst Application and went over the instructions with him and his Son. They will look over the application and call the Company with any questions. If they decide to apply, they will bring the application and all requested documents back to the office for [place] to fax with the Rx.

## 2019-04-16 NOTE — Telephone Encounter (Signed)
Pt is seeing Dr Tamala Julian today at 2:20 pm./cy

## 2019-04-16 NOTE — Patient Instructions (Signed)
Medication Instructions:  1) CONTINUE to HOLD Carvedilol 2) Take 2 Torsemide pills twice daily (total of 40mg  in the morning and 40mg  in the evening) If you need a refill on your cardiac medications before your next appointment, please call your pharmacy.   Lab work: You will need to have a BMET when you come back to the office next Thursday.  If you have labs (blood work) drawn today and your tests are completely normal, you will receive your results only by: Marland Kitchen MyChart Message (if you have MyChart) OR . A paper copy in the mail If you have any lab test that is abnormal or we need to change your treatment, we will call you to review the results.  Testing/Procedures: None  Follow-Up: You are scheduled to come see Dr. Harrington Challenger on 8/20 at 3:20pm.  Any Other Special Instructions Will Be Listed Below (If Applicable).

## 2019-04-17 LAB — BASIC METABOLIC PANEL
BUN/Creatinine Ratio: 18 (ref 10–24)
BUN: 24 mg/dL (ref 8–27)
CO2: 26 mmol/L (ref 20–29)
Calcium: 8.7 mg/dL (ref 8.6–10.2)
Chloride: 101 mmol/L (ref 96–106)
Creatinine, Ser: 1.33 mg/dL — ABNORMAL HIGH (ref 0.76–1.27)
GFR calc Af Amer: 55 mL/min/{1.73_m2} — ABNORMAL LOW (ref >59)
GFR calc non Af Amer: 47 mL/min/{1.73_m2} — ABNORMAL LOW (ref >59)
Glucose: 111 mg/dL — ABNORMAL HIGH (ref 65–99)
Potassium: 4.2 mmol/L (ref 3.5–5.2)
Sodium: 141 mmol/L (ref 134–144)

## 2019-04-22 ENCOUNTER — Other Ambulatory Visit: Payer: Self-pay

## 2019-04-22 ENCOUNTER — Encounter: Payer: Self-pay | Admitting: Internal Medicine

## 2019-04-22 ENCOUNTER — Ambulatory Visit (INDEPENDENT_AMBULATORY_CARE_PROVIDER_SITE_OTHER): Payer: Medicare Other | Admitting: Internal Medicine

## 2019-04-22 VITALS — BP 138/62 | HR 70 | Ht 70.0 in | Wt 288.2 lb

## 2019-04-22 DIAGNOSIS — I5042 Chronic combined systolic (congestive) and diastolic (congestive) heart failure: Secondary | ICD-10-CM | POA: Diagnosis not present

## 2019-04-22 DIAGNOSIS — I255 Ischemic cardiomyopathy: Secondary | ICD-10-CM

## 2019-04-22 NOTE — Progress Notes (Signed)
Cardiology Office Note   Date:  04/22/2019   ID:  Kesley, Gaffey [birth year], MRN [id number]  PCP:  Bernerd Limbo, MD  Cardiologist:   [name], MD   F/U of CHF     History of Present Illness: Ival Pacer is a 85 y.o. male with a history of3 y.o. male with a hx of h/ocoronary artery disease status post CABG in 2011 and drug-eluting stent x2 to the LAD in 2013, paroxysmal atrial fibrillation, symptomatic bradycardia with Mobitz 2 status post pacemaker implantation in January 7371, combined systolic and diastolic heart failure, hypertension, hyperlipidemia, CKD stage II and OSA on CPAP. He presented to office with fluctuating BP, dizzy spells, and weight gain of 8-10 lb. He was admitted for concern for acute on chronic combined CHF - not felt to be good candidate for outpatient therapy given complaints of orthostasis as well.  He was last in cardiology clinic in Aug 14 [place] (D[place])  COmplained of edema and SOB  Prior to that visit multibple phone calls to [employer] with med changes  BP remained low  On day of last visit he complained of SOB BP had bounced up with holding of meds    At that visit, lasix was adjusted to 60 mg in am and 40 mg pm  Since that visit the pt's wt is 16 lbsdown    He still is SOB   He thinks edema is down but ankles still swollen    He denies CP        Current Meds  Medication Sig  . apixaban (ELIQUIS) 5 MG TABS tablet Take 1 tablet (5 mg total) by mouth 2 (two) times daily.  . cetirizine (ZYRTEC) 10 MG tablet Take 10 mg by mouth daily.  . Melatonin 5 MG TABS Take 5 mg by mouth as needed (sleep).   . nitroGLYCERIN (NITROSTAT) 0.4 MG SL tablet Place 0.4 mg under the tongue every 5 (five) minutes x 3 doses as needed for chest pain.  . polyethylene glycol (MIRALAX / GLYCOLAX) 17 g packet Take 17 g by mouth daily.  . potassium chloride SA (K-DUR) 20 MEQ tablet Take 20 mEq by mouth daily.   . pravastatin (PRAVACHOL) 20 MG tablet TAKE 1  TABLET BY MOUTH  DAILY  . tamsulosin (FLOMAX) 0.4 MG CAPS capsule Take 0.4 mg by mouth daily.  Marland Kitchen torsemide (DEMADEX) 20 MG tablet Take 2 tablets (40 mg total) by mouth 2 (two) times daily.     Allergies:   Patient has no known allergies.   Past Medical History:  Diagnosis Date  . Asthma   . Benign neoplasm of colon   . CAD (coronary artery disease) Dec 2011   s/p CABG per Dr. Roxy Manns; had normal EF; All SVGs occluded per follow up cath with only LIMA to LAD patent; s/p PCI of the proximal and mid LAD February 2014 per Dr. Burt Knack  . Chronic combined systolic and diastolic CHF (congestive heart failure) ([place])   . CKD (chronic kidney disease), stage II   . Emphysema    "never treated for it" (10/05/2015)  . History of hiatal hernia   . HLD (hyperlipidemia)   . HTN (hypertension)   . Malaise and fatigue   . Obesity   . Osteopenia   . PAF (paroxysmal atrial fibrillation) ([place])   . Pneumonia 1960s X 1; 1990s X 1  . Presence of permanent cardiac pacemaker   . Prostate cancer ([place])   . Skin cancer    "  face, nose, top of ears, scalp"  . Symptomatic bradycardia    STJ PPM, 10/05/15, Dr. Lovena Le    Past Surgical History:  Procedure Laterality Date  . ANTERIOR CERVICAL DECOMP/DISCECTOMY FUSION    . BACK SURGERY    . C-EYE SURGERY PROCEDURE    . CATARACT EXTRACTION Right   . CORONARY ANGIOPLASTY WITH STENT PLACEMENT  10/14/12   with stent to proximal and mid LAD  . CORONARY ARTERY BYPASS GRAFT  08/23/10   "CABG X4"  . EP IMPLANTABLE DEVICE N/A 10/05/2015   Procedure: Pacemaker Implant;  Surgeon: [name], MD;  Location: [place] [facility];  Service: Cardiovascular;  Laterality: N/A;  . EP IMPLANTABLE DEVICE N/A 10/06/2015   Procedure: Lead Revision/Repair;  Surgeon: [name], MD;  Location: [place] [facility];  Service: Cardiovascular;  Laterality: N/A;  . EYE SURGERY    . INSERT / REPLACE / REMOVE PACEMAKER  10/05/2015  . LEFT HEART CATHETERIZATION WITH CORONARY/GRAFT  ANGIOGRAM N/A 09/01/2012   Procedure: LEFT HEART CATHETERIZATION WITH [name];  Surgeon: [name], MD;  Location: [facility] CATH LAB;  Service: Cardiovascular;  Laterality: N/A;  . MEDIAN STERNOTOMY  08/23/10  . MOHS SURGERY  "@ least twice"  . PACEMAKER INSERTION     STJ 10/05/15  . PERCUTANEOUS CORONARY STENT INTERVENTION (PCI-S) N/A 10/14/2012   Procedure: PERCUTANEOUS CORONARY STENT INTERVENTION (PCI-S);  Surgeon: [name], MD;  Location: [facility] CATH LAB;  Service: Cardiovascular;  Laterality: N/A;  . PROSTATE BIOPSY    . TRANSURETHRAL RESECTION OF PROSTATE       Social History:  The patient  reports that he quit smoking about 43 years ago. His smoking use included cigarettes. He has a 30.00 pack-year smoking history. He quit smokeless tobacco use about 3 years ago.  His smokeless tobacco use included chew. He reports that he does not drink alcohol or use drugs.   Family History:  The patient's family history includes Heart attack in his father; Heart disease in his brother; Hypertension in his mother; Kidney cancer in his father; Prostate cancer in his brother; Skin cancer in his brother; Stroke in his mother.    ROS:  Please see the history of present illness. All other systems are reviewed and  Negative to the above problem except as noted.    PHYSICAL EXAM: VS:  BP 138/62   Pulse 70   Ht _0  (1.778 m)   Wt 288 lb 3.2 oz (130.7 kg)   SpO2 95%   BMI 41.35 kg/m   GEN: Morbidly obese 85 yo in no acute distress  HEENT: normal  Neck: Neck is full Cardiac: RRR; no murmurs, rubs, or gallops,2+ edema  Respiratory:  clear to auscultation bilaterally, normal work of breathing GI: soft, nontender, nondistended, + BS  No hepatomegaly  MS: no deformity Moving all extremities   Skin: warm and dry, no rash Neuro:  Strength and sensation are intact Psych: euthymic mood, full affect   EKG:  EKG is ordered today.   Lipid Panel    Component Value Date/Time   CHOL  113 06/01/2014 0930   TRIG 115.0 06/01/2014 0930   HDL 32.40 (L) 06/01/2014 0930   CHOLHDL 3 06/01/2014 0930   VLDL 23.0 06/01/2014 0930   LDLCALC 58 06/01/2014 0930   LDLDIRECT 70.7 06/10/2013 1344      Wt Readings from Last 3 Encounters:  04/22/19 288 lb 3.2 oz (130.7 kg)  04/16/19 290 lb 6.4 oz (131.7 kg)  03/22/19  285 lb (129.3 kg)    The following studies were reviewed today: Echo 08/28/18 Mild conc LVH, EF 40-45, inf/inf-sept/apical HK, Gr 2 DD, MAC, normal RVSF, mod RAE, increased RA pressure, mild TR  Cardiac catheterization 02/10/2017 ([facility]) Coronary angiography reveals 3-vessel CAD.  LM is diffusely diseased with 50% distal stenosis.  LAD has a 80% mid instent restenosis,  LCX has serial 50-60% stenoses in the mid segment, OM2 is a small vessel and has a 70% ostial stenosis.  RCA has long diffuse 50-60% proximal and 60% distal stenosis.  LIMA to LAD is patent with a kink in the mid segment. Flow is normal.  LV-Gram reveals EF 50%. LVEDP is 8 mmHg.  SVG grafts are known to be chronically occluded, so not injected   Nuclear stress test 01/21/2017 ([facility]) 1. Moderate to severe inferoapical inducible ischemia with regadenoson 2. Mild to moderate mid inferior and posterior basal defect. Cannot rule out prior infarct. 3. Mild to moderate hypokinesis of the inferior wall. Dyskinesia/akinesia of the apex. 4. Moderately decreased left ventricular ejection fraction at 43%. 5. Please correlate with a cardiac ultrasound  Echo 06/20/2016 EF 16-96, grade 1 diastolic dysfunction, MAC, moderate LAE  Echo 09/28/2015 Mild concentric LVH, EF 55-60, normal wall motion, MAC, mild MR, severe LAE, normal RVSF, mild RAE, mild TR, PASP 41, GLS -14.8%  Exercise tolerance test 09/27/2015 Poor exercise capacity.  Test stopped early as HR 58 >> 33 and patient was weak. ECGs demonstrate transient complete heart block.  Dr.  Cristopher Peru saw patient and discussed proceeding with pacemaker implantation. Echo will be arranged.  Carotid US 09/14/2015 Bilateral ICA 1-39; partial left subclavian steal>>follow-up as needed  Nuclear stress test 11/29/2014 Low risk stress nuclear study with a moderate size, medium intensity, fixed inferior/apical defect consistent with prior infarct; no significant ischemia. LV Ejection Fraction: 56%.  Echo (10/15):  Mild LVH, EF 55-60%, no RWMA, mod LAE, mild TR, PASP 42 mmHg  Nuclear (4/15):  Low risk stress nuclear study with a fixed defect in the apical anterior, inferior walls and in the true apex consistent with distal LAD scar. No ischemia. EF 56%  LHC (12/13):  Dist LM 60-70%, ostial LAD 40-50%, D1 occluded, prox LAD 80% then aneurysmal segment then 80%, ostial septal perforator 99%, prox CFX 50%, OM branch serial 60% lesions, prox RCA 30%, PL Br 50%; LIMA-LAD patent, SVG-LAD occluded, SVG-OM occluded, SVG-PDA occluded >>Med Rx >>continued angina >> PCI (2/14): 2.25 x 32 mm Promus DES and 2.5 x 20 mm Promus DES to prox and mid LAD   ASSESSMENT AND PLAN:  1  Acute on chronic systolic/diastolic CHF    Pt recently seen in clinic and meds adjusted   He has lost a signif amount of wt    I would recomm checking labs    He would lke to come off of PM lasix but will review before making changes   He is still volume overloaded  2   CAD   I am not convinced of active ischemia  3  CKD   Check BMET  4  HTN   BP is OK  F/U based on test results    Current medicines are reviewed at length with the patient today.  The patient does not have concerns regarding medicines.  Signed, [name], MD  04/22/2019 3:31 PM    [facility] [address], [place], [place]  [address] Phone: [phone]; Fax: [phone]    

## 2019-04-22 NOTE — Patient Instructions (Signed)
Medication Instructions:  No changes If you need a refill on your cardiac medications before your next appointment, please call your pharmacy.   Lab work: Today: bmet, bnp If you have labs (blood work) drawn today and your tests are completely normal, you will receive your results only by: Marland Kitchen MyChart Message (if you have MyChart) OR . A paper copy in the mail If you have any lab test that is abnormal or we need to change your treatment, we will call you to review the results.  Testing/Procedures: none  Follow-Up: At [facility], you and your health needs are our priority.  As part of our continuing mission to provide you with exceptional heart care, we have created designated Provider Care Teams.  These Care Teams include your primary Cardiologist (physician) and Advanced Practice Providers (APPs -  Physician Assistants and Nurse Practitioners) who all work together to provide you with the care you need, when you need it. You will need a follow up appointment in:  6-8 weeks.  Please call our office 2 months in advance to schedule this appointment.  You may see [name], MD or one of the following Advanced Practice Providers on your designated Care Team: Richardson Dopp, PA-C [place], [place] . Daune Perch, NP  Any Other Special Instructions Will Be Listed Below (If Applicable).

## 2019-04-23 ENCOUNTER — Telehealth: Payer: Self-pay

## 2019-04-23 LAB — BASIC METABOLIC PANEL
BUN/Creatinine Ratio: 20 (ref 10–24)
BUN: 25 mg/dL (ref 8–27)
CO2: 25 mmol/L (ref 20–29)
Calcium: 9.2 mg/dL (ref 8.6–10.2)
Chloride: 97 mmol/L (ref 96–106)
Creatinine, Ser: 1.27 mg/dL (ref 0.76–1.27)
GFR calc Af Amer: 58 mL/min/{1.73_m2} — ABNORMAL LOW (ref >59)
GFR calc non Af Amer: 50 mL/min/{1.73_m2} — ABNORMAL LOW (ref >59)
Glucose: 96 mg/dL (ref 65–99)
Potassium: 4.3 mmol/L (ref 3.5–5.2)
Sodium: 138 mmol/L (ref 134–144)

## 2019-04-23 LAB — PRO B NATRIURETIC PEPTIDE: NT-Pro BNP: 505 pg/mL — ABNORMAL HIGH (ref 0–486)

## 2019-04-23 NOTE — Telephone Encounter (Signed)
-----   Message from Rodman Key, RN sent at 04/22/2019  5:24 PM EDT ----- Regarding: donut hole Patient seen today by Dr. Harrington Challenger, given samples of Eliquis. He reports is in the donut hole and cannot afford this medication. Adv him to get a copy of what has been paid out this year from his pharmacy. Adv that he would receive a call to discuss patient assistance. Thanks [employer]

## 2019-04-23 NOTE — Telephone Encounter (Signed)
**Note De-Identified Willmer Fellers Obfuscation** It looks like we gave the pt Eliquis samples, a BMS Pt Asst application on 0/73 and went over instructions with the pt and his son on filling out the application.  I left a message on the pts cellphone VM and his home phone VM asking him to call me back ASAP concerning his Eliquis.

## 2019-04-23 NOTE — Telephone Encounter (Signed)
**Note De-Identified Eyob Godlewski Obfuscation** The pt called back and stated that his daughter has his BMS application that we gave him and according to the pt she says he is not eligible to receive pt asst.  The pt states that his daughter is camping at this time and that the soonest he will be able to talk to her will be Monday.  I advised him to ask his daughter to call me so we can discuss options to try to keep him on Eliquis.  He is aware that we will continue to try to help him out with samples if we have them available as long as we are working towards a way for him to afford his Eliquis.   He verbalized understanding, thanked me for calling him and requested BMS Pt Asst's phone number. He states that he will attempt to call them to see if he is eligible and will request that they mail him an application if he is eligible as he thinks the one we gave him may have been misplaced. He is aware to call me back if he needs assistance.

## 2019-04-25 ENCOUNTER — Telehealth: Payer: Self-pay | Admitting: Internal Medicine

## 2019-04-25 DIAGNOSIS — I5042 Chronic combined systolic (congestive) and diastolic (congestive) heart failure: Secondary | ICD-10-CM

## 2019-04-25 NOTE — Telephone Encounter (Signed)
Pt's daughter wrote in to discuss Potassium dose Pt's K is 4.3 on labs   Whatever he is taking I would continue

## 2019-04-26 MED ORDER — POTASSIUM CHLORIDE CRYS ER 20 MEQ PO TBCR: 20.0000 meq | EXTENDED_RELEASE_TABLET | Freq: Two times a day (BID) | ORAL | 3 refills | Status: DC

## 2019-04-26 NOTE — Telephone Encounter (Signed)
Left VM for patient's daughter of information below. Also sent MyChart message.  I have updated his medication list to reflect potassium 20 meq--one tab twice a day. He will have repeat labs (per lab results) on 8/31 when he returns for appointment with GT.  Notes recorded by [name], MD on 04/23/2019 at 10:44 PM EDT  Overall labs are better   Kidney function is OK  FLuid improved.   I would keep on current regimen for now, try to get fluid down before backing off on evening dose  Weight is down but still more to go  REcomm BMET and BNP in 2 wks

## 2019-04-26 NOTE — Addendum Note (Signed)
Addended by: Rodman Key on: 04/26/2019 12:48 PM   Modules accepted: Orders

## 2019-04-28 DIAGNOSIS — G4733 Obstructive sleep apnea (adult) (pediatric): Secondary | ICD-10-CM | POA: Diagnosis not present

## 2019-05-03 ENCOUNTER — Other Ambulatory Visit: Payer: Medicare Other

## 2019-05-03 ENCOUNTER — Ambulatory Visit (INDEPENDENT_AMBULATORY_CARE_PROVIDER_SITE_OTHER): Payer: Medicare Other | Admitting: Internal Medicine

## 2019-05-03 ENCOUNTER — Encounter: Payer: Self-pay | Admitting: Internal Medicine

## 2019-05-03 ENCOUNTER — Ambulatory Visit: Payer: Medicare Other | Admitting: Physician Assistant

## 2019-05-03 ENCOUNTER — Other Ambulatory Visit: Payer: Self-pay

## 2019-05-03 VITALS — BP 118/52 | HR 73 | Ht 70.0 in | Wt 289.2 lb

## 2019-05-03 DIAGNOSIS — I25119 Atherosclerotic heart disease of native coronary artery with unspecified angina pectoris: Secondary | ICD-10-CM

## 2019-05-03 DIAGNOSIS — I452 Bifascicular block: Secondary | ICD-10-CM | POA: Diagnosis not present

## 2019-05-03 DIAGNOSIS — I10 Essential (primary) hypertension: Secondary | ICD-10-CM

## 2019-05-03 DIAGNOSIS — I255 Ischemic cardiomyopathy: Secondary | ICD-10-CM | POA: Diagnosis not present

## 2019-05-03 DIAGNOSIS — I5042 Chronic combined systolic (congestive) and diastolic (congestive) heart failure: Secondary | ICD-10-CM

## 2019-05-03 DIAGNOSIS — Z950 Presence of cardiac pacemaker: Secondary | ICD-10-CM | POA: Diagnosis not present

## 2019-05-03 MED ORDER — TORSEMIDE 20 MG PO TABS: 40.0000 mg | ORAL_TABLET | Freq: Two times a day (BID) | ORAL | 0 refills | Status: DC

## 2019-05-03 NOTE — Progress Notes (Signed)
HPI Mr. Roehrich returns today for followup. He is a pleasant 85yo man with morbid obesity, CAD, s/p CABG, HTN, and peristent atrial fib. The patient has had trouble with fluid overload. He admits to urinating all night as he takes his torsemide in the evening. He denies syncope.  No Known Allergies   Current Outpatient Medications  Medication Sig Dispense Refill  . apixaban (ELIQUIS) 5 MG TABS tablet Take 1 tablet (5 mg total) by mouth 2 (two) times daily. 180 tablet 3  . cetirizine (ZYRTEC) 10 MG tablet Take 10 mg by mouth daily.    . Melatonin 5 MG TABS Take 5 mg by mouth as needed (sleep).     . nitroGLYCERIN (NITROSTAT) 0.4 MG SL tablet Place 0.4 mg under the tongue every 5 (five) minutes x 3 doses as needed for chest pain.    . polyethylene glycol (MIRALAX / GLYCOLAX) 17 g packet Take 17 g by mouth daily.    . potassium chloride SA (K-DUR) 20 MEQ tablet Take 1 tablet (20 mEq total) by mouth 2 (two) times daily. 180 tablet 3  . pravastatin (PRAVACHOL) 20 MG tablet TAKE 1 TABLET BY MOUTH  DAILY 90 tablet 3  . tamsulosin (FLOMAX) 0.4 MG CAPS capsule Take 0.4 mg by mouth daily.    Marland Kitchen torsemide (DEMADEX) 20 MG tablet Take 2 tablets (40 mg total) by mouth 2 (two) times daily. 120 tablet 0   No current facility-administered medications for this visit.      Past Medical History:  Diagnosis Date  . Asthma   . Benign neoplasm of colon   . CAD (coronary artery disease) Dec 2011   s/p CABG per Dr. Roxy Manns; had normal EF; All SVGs occluded per follow up cath with only LIMA to LAD patent; s/p PCI of the proximal and mid LAD February 2014 per Dr. Burt Knack  . Chronic combined systolic and diastolic CHF (congestive heart failure) ([place])   . CKD (chronic kidney disease), stage II   . Emphysema    "never treated for it" (10/05/2015)  . History of hiatal hernia   . HLD (hyperlipidemia)   . HTN (hypertension)   . Malaise and fatigue   . Obesity   . Osteopenia   . PAF (paroxysmal atrial  fibrillation) ([place])   . Pneumonia 1960s X 1; 1990s X 1  . Presence of permanent cardiac pacemaker   . Prostate cancer ([place])   . Skin cancer    "face, nose, top of ears, scalp"  . Symptomatic bradycardia    STJ PPM, 10/05/15, Dr. Lovena Le    ROS:   All systems reviewed and negative except as noted in the HPI.   Past Surgical History:  Procedure Laterality Date  . ANTERIOR CERVICAL DECOMP/DISCECTOMY FUSION    . BACK SURGERY    . C-EYE SURGERY PROCEDURE    . CATARACT EXTRACTION Right   . CORONARY ANGIOPLASTY WITH STENT PLACEMENT  10/14/12   with stent to proximal and mid LAD  . CORONARY ARTERY BYPASS GRAFT  08/23/10   "CABG X4"  . EP IMPLANTABLE DEVICE N/A 10/05/2015   Procedure: Pacemaker Implant;  Surgeon: [name], MD;  Location: [place] [facility] LAB;  Service: Cardiovascular;  Laterality: N/A;  . EP IMPLANTABLE DEVICE N/A 10/06/2015   Procedure: Lead Revision/Repair;  Surgeon: [name], MD;  Location: [place] [facility];  Service: Cardiovascular;  Laterality: N/A;  . EYE SURGERY    . INSERT / REPLACE / REMOVE PACEMAKER  10/05/2015  . LEFT HEART CATHETERIZATION WITH CORONARY/GRAFT ANGIOGRAM N/A 09/01/2012   Procedure: LEFT HEART CATHETERIZATION WITH [name];  Surgeon: [name], MD;  Location: [facility] CATH LAB;  Service: Cardiovascular;  Laterality: N/A;  . MEDIAN STERNOTOMY  08/23/10  . MOHS SURGERY  "@ least twice"  . PACEMAKER INSERTION     STJ 10/05/15  . PERCUTANEOUS CORONARY STENT INTERVENTION (PCI-S) N/A 10/14/2012   Procedure: PERCUTANEOUS CORONARY STENT INTERVENTION (PCI-S);  Surgeon: [name], MD;  Location: [facility] C[facility] LAB;  Service: Cardiovascular;  Laterality: N/A;  . PROSTATE BIOPSY    . TRANSURETHRAL RESECTION OF PROSTATE       Family History  Problem Relation Age of Onset  . Kidney cancer Father   . Heart attack Father   . Hypertension Mother   . Stroke Mother   . Heart disease Brother   . Skin cancer Brother   . Prostate cancer  Brother   . Allergic rhinitis Neg Hx   . Asthma Neg Hx   . Eczema Neg Hx   . Urticaria Neg Hx      Social History   Socioeconomic History  . Marital status: Widowed    Spouse name: Not on file  . Number of children: Not on file  . Years of education: Not on file  . Highest education level: Not on file  Occupational History  . Occupation: retired Information systems manager: RETIRED  Social Needs  . Financial resource strain: Not on file  . Food insecurity    Worry: Not on file    Inability: Not on file  . Transportation needs    Medical: Not on file    Non-medical: Not on file  Tobacco Use  . Smoking status: Former Smoker    Packs/day: 1.00    Years: 30.00    Pack years: 30.00    Types: Cigarettes    Quit date: 09/07/1975    Years since quitting: 43.6  . Smokeless tobacco: Former Systems developer    Types: [place] date: 07/27/2015  Substance and Sexual Activity  . Alcohol use: No    Alcohol/week: 0.0 standard drinks  . Drug use: No  . Sexual activity: Not Currently  Lifestyle  . Physical activity    Days per week: Not on file    Minutes per session: Not on file  . Stress: Not on file  Relationships  . Social Herbalist on phone: Not on file    Gets together: Not on file    Attends religious service: Not on file    Active member of club or organization: Not on file    Attends meetings of clubs or organizations: Not on file    Relationship status: Not on file  . Intimate partner violence    Fear of current or ex partner: Not on file    Emotionally abused: Not on file    Physically abused: Not on file    Forced sexual activity: Not on file  Other Topics Concern  . Not on file  Social History Narrative  . Not on file     BP (!) 118/52   Pulse 73   Ht 5\' 10"  (1.778 m)   Wt 289 lb 3.2 oz (131.2 kg)   SpO2 94%   BMI 41.50 kg/m   Physical Exam:  Well appearing NAD HEENT: Unremarkable Neck:  No JVD, no thyromegally Lymphatics:  No adenopathy Back:   No CVA tenderness Lungs:  Clear  with no wheezes HEART:  Regular rate rhythm, no murmurs, no rubs, no clicks Abd:  soft, positive bowel sounds, no organomegally, no rebound, no guarding Ext:  2 plus pulses, no edema, no cyanosis, no clubbing Skin:  No rashes no nodules Neuro:  CN II through XII intact, motor grossly intact   DEVICE  Normal device function.  See PaceArt for details.   Assess/Plan: 1. Atrial fib - his VR is well controlled.  2. Chronic systolic/diastolic heart failure - his symptoms are class 2B. He will continue torsemide. I asked him to reduce his salt intake. 3. Obesity - he is encouraged to lose weight. 4. PPM - His St. Jude DDD PM is working normally.  5. CHB - he has no escape today. He is asymptomatic, s/p PPM insertion.  [name].D.

## 2019-05-03 NOTE — Patient Instructions (Signed)
Medication Instructions:  Your physician recommends that you continue on your current medications as directed. Please refer to the Current Medication list given to you today.  TORSEMIDE--Take your first 2 tablets (40 mg) as soon as you wake up. Then take your next 2 tablets (40 mg) right after lunch.  Labwork: None ordered.  Testing/Procedures: None ordered.  Follow-Up: Your physician wants you to follow-up in: 6 months with Dr. Lovena Le.   You will receive a reminder letter in the mail two months in advance. If you don't receive a letter, please call our office to schedule the follow-up appointment.  Remote monitoring is used to monitor your Pacemaker from home. This monitoring reduces the number of office visits required to check your device to one time per year. It allows [place] to keep an eye on the functioning of your device to ensure it is working properly. You are scheduled for a device check from home on 06/22/2019. You may send your transmission at any time that day. If you have a wireless device, the transmission will be sent automatically. After your physician reviews your transmission, you will receive a postcard with your next transmission date.  Any Other Special Instructions Will Be Listed Below (If Applicable).  If you need a refill on your cardiac medications before your next appointment, please call your pharmacy.

## 2019-05-04 ENCOUNTER — Telehealth: Payer: Self-pay | Admitting: Internal Medicine

## 2019-05-04 LAB — BASIC METABOLIC PANEL
BUN/Creatinine Ratio: 15 (ref 10–24)
BUN: 25 mg/dL (ref 8–27)
CO2: 25 mmol/L (ref 20–29)
Calcium: 9 mg/dL (ref 8.6–10.2)
Chloride: 100 mmol/L (ref 96–106)
Creatinine, Ser: 1.7 mg/dL — ABNORMAL HIGH (ref 0.76–1.27)
GFR calc Af Amer: 41 mL/min/{1.73_m2} — ABNORMAL LOW (ref >59)
GFR calc non Af Amer: 35 mL/min/{1.73_m2} — ABNORMAL LOW (ref >59)
Glucose: 102 mg/dL — ABNORMAL HIGH (ref 65–99)
Potassium: 4 mmol/L (ref 3.5–5.2)
Sodium: 142 mmol/L (ref 134–144)

## 2019-05-04 LAB — PRO B NATRIURETIC PEPTIDE: NT-Pro BNP: 1162 pg/mL — ABNORMAL HIGH (ref 0–486)

## 2019-05-04 NOTE — Telephone Encounter (Signed)
New message:    Patient daughter calling about a form that they gave her father to fill out and would like for some to call her back.

## 2019-05-04 NOTE — Telephone Encounter (Signed)
**Note De-Identified Calvyn Kurtzman Obfuscation** [name], the pts daughter (DPR) had a lot of questions concerning her dad applying for pt asst through BMS for his Eliquis. I answered all of her questions at this time and gave her BMS phone # to call if she has questions in the further concerning their pt asst program.  [name] states that she will complete the pts BMS pt asst application, obtain his 2019 proof of income, his 2020 out of pocket expense report from his pharmacy, and that she will bring to the office Friday 9/4 to drop off so we can handle the providers part of the application and fax all to BMS.

## 2019-05-04 NOTE — Telephone Encounter (Signed)
LMTCB

## 2019-05-05 DIAGNOSIS — Z0000 Encounter for general adult medical examination without abnormal findings: Secondary | ICD-10-CM | POA: Diagnosis not present

## 2019-05-06 ENCOUNTER — Telehealth: Payer: Self-pay | Admitting: Nurse Practitioner

## 2019-05-06 DIAGNOSIS — N182 Chronic kidney disease, stage 2 (mild): Secondary | ICD-10-CM

## 2019-05-06 DIAGNOSIS — I5042 Chronic combined systolic (congestive) and diastolic (congestive) heart failure: Secondary | ICD-10-CM

## 2019-05-06 MED ORDER — TORSEMIDE 20 MG PO TABS: ORAL_TABLET | ORAL | 3 refills | Status: DC

## 2019-05-06 NOTE — Telephone Encounter (Signed)
Results and plan of care reviewed with patient's daughter, Levander Campion, who verbalized understanding and agreement with plan. Patient is scheduled for lab appointment on 9/14. I advised her to call back with questions or concerns prior to that appointment. She thanked me for the call.

## 2019-05-07 ENCOUNTER — Other Ambulatory Visit: Payer: Self-pay

## 2019-05-11 ENCOUNTER — Telehealth: Payer: Self-pay | Admitting: Internal Medicine

## 2019-05-11 DIAGNOSIS — N401 Enlarged prostate with lower urinary tract symptoms: Secondary | ICD-10-CM

## 2019-05-11 NOTE — Telephone Encounter (Signed)
° ° °  Please return call to [facility]

## 2019-05-11 NOTE — Telephone Encounter (Signed)
  Calling to see if we received prior auth paperwork for medical supplies

## 2019-05-11 NOTE — Telephone Encounter (Signed)
Left non specific message for "Levi [name]" d/t phone vm does not state it is confidential.  Without identifying Pt notified [name] that no PA paperwork had been received at our office for a Pt of Dr. Harrington Challenger.  Advised to refax paperwork.  Fax # given.

## 2019-05-11 NOTE — Telephone Encounter (Signed)
Left a message for [name] at [facility] to call the office back and request to speak with any triage nurse for further assistance.

## 2019-05-11 NOTE — Telephone Encounter (Signed)
New Message   Patient's daughter states Dr. Mosie Epstein patient's urologist wants him to have a PSA lab test done.  Daughter wants to know if he can get it done here when he comes in on 06/11/19 to have other lab test done for Dr. Harrington Challenger.  Please call patient back to discuss.

## 2019-05-12 NOTE — Telephone Encounter (Signed)
PSA ordered for Dr. Kary Kos when pt comes in for his other labs 05/17/19.

## 2019-05-12 NOTE — Telephone Encounter (Signed)
Patient is returning a call. °

## 2019-05-13 NOTE — Telephone Encounter (Signed)
Spoke with [name] at [facility].  He is asking about status of forms that he faxed re: a FreeStyle Libre device.  Adv that he should reach out to patient's PCP for this order since Dr. Harrington Challenger does not manage his diabetes. He verbalizes understanding and stated the patient requested they ask Dr. Harrington Challenger for this. He will update their record and asked that I send back to form with response.  Adv I can send next week when I am working in office.

## 2019-05-13 NOTE — Telephone Encounter (Addendum)
Follow up    [name] from [facility] calling [phone], states he has not received paperwork. Requesting call from nurse or Dr Harrington Challenger

## 2019-05-17 ENCOUNTER — Other Ambulatory Visit: Payer: Self-pay

## 2019-05-17 ENCOUNTER — Other Ambulatory Visit: Payer: Medicare Other | Admitting: *Deleted

## 2019-05-17 DIAGNOSIS — I5042 Chronic combined systolic (congestive) and diastolic (congestive) heart failure: Secondary | ICD-10-CM | POA: Diagnosis not present

## 2019-05-17 DIAGNOSIS — N182 Chronic kidney disease, stage 2 (mild): Secondary | ICD-10-CM

## 2019-05-17 DIAGNOSIS — N401 Enlarged prostate with lower urinary tract symptoms: Secondary | ICD-10-CM | POA: Diagnosis not present

## 2019-05-17 LAB — CUP PACEART INCLINIC DEVICE CHECK
Battery Remaining Longevity: 105 mo
Battery Voltage: 2.99 V
Brady Statistic RA Percent Paced: 42 %
Brady Statistic RV Percent Paced: 99 %
Date Time Interrogation Session: 20200831174451
Implantable Lead Implant Date: 20170202
Implantable Lead Implant Date: 20170202
Implantable Lead Location: 753859
Implantable Lead Location: 753860
Implantable Pulse Generator Implant Date: 20170202
Lead Channel Impedance Value: 400 Ohm
Lead Channel Impedance Value: 400 Ohm
Lead Channel Pacing Threshold Amplitude: 1 V
Lead Channel Pacing Threshold Amplitude: 1.75 V
Lead Channel Pacing Threshold Pulse Width: 0.4 ms
Lead Channel Pacing Threshold Pulse Width: 0.4 ms
Lead Channel Sensing Intrinsic Amplitude: 2.7 mV
Lead Channel Setting Pacing Amplitude: 1.25 V
Lead Channel Setting Pacing Amplitude: 2.5 V
Lead Channel Setting Pacing Pulse Width: 0.4 ms
Lead Channel Setting Sensing Sensitivity: 2 mV
Pulse Gen Model: 2240
Pulse Gen Serial Number: 7864186

## 2019-05-18 ENCOUNTER — Telehealth: Payer: Self-pay

## 2019-05-18 LAB — BASIC METABOLIC PANEL
BUN/Creatinine Ratio: 16 (ref 10–24)
BUN: 22 mg/dL (ref 8–27)
CO2: 24 mmol/L (ref 20–29)
Calcium: 8.9 mg/dL (ref 8.6–10.2)
Chloride: 98 mmol/L (ref 96–106)
Creatinine, Ser: 1.39 mg/dL — ABNORMAL HIGH (ref 0.76–1.27)
GFR calc Af Amer: 52 mL/min/{1.73_m2} — ABNORMAL LOW (ref >59)
GFR calc non Af Amer: 45 mL/min/{1.73_m2} — ABNORMAL LOW (ref >59)
Glucose: 99 mg/dL (ref 65–99)
Potassium: 3.9 mmol/L (ref 3.5–5.2)
Sodium: 138 mmol/L (ref 134–144)

## 2019-05-18 LAB — PRO B NATRIURETIC PEPTIDE: NT-Pro BNP: 477 pg/mL (ref 0–486)

## 2019-05-18 LAB — PSA: Prostate Specific Ag, Serum: 5.8 ng/mL — ABNORMAL HIGH (ref 0.0–4.0)

## 2019-05-18 NOTE — Telephone Encounter (Signed)
**Note De-Identified Estephan Gallardo Obfuscation** The pt left his completed [employer] pt asst application with documents at the office. We have completed the providers part of the application, Dr Marlou Porch (DOD) has signed it and we have faxed all to [employer].

## 2019-05-20 NOTE — Telephone Encounter (Signed)
Patient's daughter called wanting to make sure that the PSA lab work can be done when her father come in on 10/9 to see Dr. Harrington Challenger with whatever other lab work she does for him.  She would like a call back from the nurse to confirm that this would be possible.

## 2019-05-24 NOTE — Telephone Encounter (Signed)
Spoke with patient's daughter this morning and informed that PSA has been drawn and resulted and will be forwarded to PCP per Dr. Alan Ripper recommendation. Daughter requested that PSA result also be sent to Dr. Louis Meckel at [facility]. Results forwarded.

## 2019-05-25 DIAGNOSIS — Z23 Encounter for immunization: Secondary | ICD-10-CM | POA: Diagnosis not present

## 2019-05-28 NOTE — Telephone Encounter (Signed)
**Note De-Identified Levi [name] Obfuscation** Letter received from [employer] stating that they have denied the pt assistance with his Eliquis. Reason: Medication is covered by the pts INS plan.  The letter states that they have notified the pt of this denial as well.

## 2019-06-10 NOTE — Progress Notes (Signed)
Cardiology Office Note   Date:  06/10/2019   ID:  Levi, [name] [birth year], MRN [id number]  PCP:  Bernerd Limbo, MD  Cardiologist:   [name], MD   F/U of CHF     History of Present Illness: Levi [name] is a 85 y.o. male with a history of52 y.o. male with a hx of h/ocoronary artery disease status post CABG in 2011 and drug-eluting stent x2 to the LAD in 2013, paroxysmal atrial fibrillation, symptomatic bradycardia with Mobitz 2 status post pacemaker implantation in January 5789, combined systolic and diastolic heart failure, hypertension, hyperlipidemia, CKD stage II and OSA on CPAP.  I last saw the pt in [place]   He was seen by [name] after     Since seen he says he is watching his salt    Breathing is OK    The patient does note some CP that will wake him up at night   Not at other times   Does have some LE edema though it has been worse     No outpatient medications have been marked as taking for the 06/11/19 encounter (Appointment) with [name], MD.     Allergies:   Patient has no known allergies.   Past Medical History:  Diagnosis Date  . Asthma   . Benign neoplasm of colon   . CAD (coronary artery disease) Dec 2011   s/p CABG per Dr. Roxy Manns; had normal EF; All SVGs occluded per follow up cath with only LIMA to LAD patent; s/p PCI of the proximal and mid LAD February 2014 per Dr. Burt Knack  . Chronic combined systolic and diastolic CHF (congestive heart failure) ([place])   . CKD (chronic kidney disease), stage II   . Emphysema    "never treated for it" (10/05/2015)  . History of hiatal hernia   . HLD (hyperlipidemia)   . HTN (hypertension)   . Malaise and fatigue   . Obesity   . Osteopenia   . PAF (paroxysmal atrial fibrillation) ([place])   . Pneumonia 1960s X 1; 1990s X 1  . Presence of permanent cardiac pacemaker   . Prostate cancer ([place])   . Skin cancer    "face, nose, top of ears, scalp"  . Symptomatic bradycardia    STJ PPM, 10/05/15, Dr.  Lovena Le    Past Surgical History:  Procedure Laterality Date  . ANTERIOR CERVICAL DECOMP/DISCECTOMY FUSION    . BACK SURGERY    . C-EYE SURGERY PROCEDURE    . CATARACT EXTRACTION Right   . CORONARY ANGIOPLASTY WITH STENT PLACEMENT  10/14/12   with stent to proximal and mid LAD  . CORONARY ARTERY BYPASS GRAFT  08/23/10   "CABG X4"  . EP IMPLANTABLE DEVICE N/A 10/05/2015   Procedure: Pacemaker Implant;  Surgeon: [name], MD;  Location: [facility];  Service: Cardiovascular;  Laterality: N/A;  . EP IMPLANTABLE DEVICE N/A 10/06/2015   Procedure: Lead Revision/Repair;  Surgeon: [name], MD;  Location: [facility];  Service: Cardiovascular;  Laterality: N/A;  . EYE SURGERY    . INSERT / REPLACE / REMOVE PACEMAKER  10/05/2015  . LEFT HEART CATHETERIZATION WITH CORONARY/GRAFT ANGIOGRAM N/A 09/01/2012   Procedure: LEFT HEART CATHETERIZATION WITH [name];  Surgeon: [name], MD;  Location: [facility];  Service: Cardiovascular;  Laterality: N/A;  . MEDIAN STERNOTOMY  08/23/10  . MOHS SURGERY  "@ least twice"  . PACEMAKER INSERTION  STJ 10/05/15  . PERCUTANEOUS CORONARY STENT INTERVENTION (PCI-S) N/A 10/14/2012   Procedure: PERCUTANEOUS CORONARY STENT INTERVENTION (PCI-S);  Surgeon: [name], MD;  Location: [facility] CATH LAB;  Service: Cardiovascular;  Laterality: N/A;  . PROSTATE BIOPSY    . TRANSURETHRAL RESECTION OF PROSTATE       Social History:  The patient  reports that he quit smoking about 43 years ago. His smoking use included cigarettes. He has a 30.00 pack-year smoking history. He quit smokeless tobacco use about 3 years ago.  His smokeless tobacco use included chew. He reports that he does not drink alcohol or use drugs.   Family History:  The patient's family history includes Heart attack in his father; Heart disease in his brother; Hypertension in his mother; Kidney cancer in his father; Prostate cancer in his brother; Skin cancer  in his brother; Stroke in his mother.    ROS:  Please see the history of present illness. All other systems are reviewed and  Negative to the above problem except as noted.    PHYSICAL EXAM: VS:  There were no vitals taken for this visit.  GEN: Morbidly obese 85 yo in no acute distress Examined in chair   HEENT: normal  Neck: Neck is full Cardiac: RRR; no murmurs, rubs, or gallops, 1+  edema  Respiratory:  Crackles at bases GI: Obese   Nontender   MS: no deformity Moving all extremities   Skin: warm and dry, no rash Neuro:  Strength and sensation are intact Psych: euthymic mood, full affect   EKG:  EKG is not  ordered today.   Lipid Panel    Component Value Date/Time   CHOL 113 06/01/2014 0930   TRIG 115.0 06/01/2014 0930   HDL 32.40 (L) 06/01/2014 0930   CHOLHDL 3 06/01/2014 0930   VLDL 23.0 06/01/2014 0930   LDLCALC 58 06/01/2014 0930   LDLDIRECT 70.7 06/10/2013 1344      Wt Readings from Last 3 Encounters:  05/03/19 289 lb 3.2 oz (131.2 kg)  04/22/19 288 lb 3.2 oz (130.7 kg)  04/16/19 290 lb 6.4 oz (131.7 kg)    The following studies were reviewed today: Echo 08/28/18 Mild conc LVH, EF 40-45, inf/inf-sept/apical HK, Gr 2 DD, MAC, normal RVSF, mod RAE, increased RA pressure, mild TR  Cardiac catheterization 02/10/2017 ([facility]) Coronary angiography reveals 3-vessel CAD.  LM is diffusely diseased with 50% distal stenosis.  LAD has a 80% mid instent restenosis,  LCX has serial 50-60% stenoses in the mid segment, OM2 is a small vessel and has a 70% ostial stenosis.  RCA has long diffuse 50-60% proximal and 60% distal stenosis.  LIMA to LAD is patent with a kink in the mid segment. Flow is normal.  LV-Gram reveals EF 50%. LVEDP is 8 mmHg.  SVG grafts are known to be chronically occluded, so not injected   Nuclear stress test 01/21/2017 ([facility]) 1. Moderate to severe inferoapical inducible ischemia with  regadenoson 2. Mild to moderate mid inferior and posterior basal defect. Cannot rule out prior infarct. 3. Mild to moderate hypokinesis of the inferior wall. Dyskinesia/akinesia of the apex. 4. Moderately decreased left ventricular ejection fraction at 43%. 5. Please correlate with a cardiac ultrasound  Echo 06/20/2016 EF 38-10, grade 1 diastolic dysfunction, MAC, moderate LAE  Echo 09/28/2015 Mild concentric LVH, EF 55-60, normal wall motion, MAC, mild MR, severe LAE, normal RVSF, mild RAE, mild TR, PASP 41, GLS -14.8%   ASSESSMENT AND  PLAN:  1  Acute on chronic systolic/diastolic CHF    VOlume is up some but better than it has been in the past   WIll check labs  May be able to adjust lasix     Keep watching salt  2   CAD  No symptoms of angina     3  CKD   WIll check BMET    4  HTN   BP control is OK     5  Chest pain  ? reflx  At night Try PEPSID    Adjust meds based on labs F/U in Jan    Current medicines are reviewed at length with the patient today.  The patient does not have concerns regarding medicines.  Signed, [name], MD  06/10/2019 9:32 PM    [facility] [address], [place], [place]  [address] Phone: [phone]; Fax: [phone]

## 2019-06-11 ENCOUNTER — Encounter: Payer: Self-pay | Admitting: Internal Medicine

## 2019-06-11 ENCOUNTER — Ambulatory Visit (INDEPENDENT_AMBULATORY_CARE_PROVIDER_SITE_OTHER): Payer: Medicare Other | Admitting: Internal Medicine

## 2019-06-11 ENCOUNTER — Other Ambulatory Visit: Payer: Self-pay

## 2019-06-11 VITALS — BP 120/66 | HR 74 | Ht 69.0 in | Wt 292.8 lb

## 2019-06-11 DIAGNOSIS — I255 Ischemic cardiomyopathy: Secondary | ICD-10-CM | POA: Diagnosis not present

## 2019-06-11 DIAGNOSIS — I10 Essential (primary) hypertension: Secondary | ICD-10-CM

## 2019-06-11 DIAGNOSIS — E785 Hyperlipidemia, unspecified: Secondary | ICD-10-CM

## 2019-06-11 DIAGNOSIS — I5042 Chronic combined systolic (congestive) and diastolic (congestive) heart failure: Secondary | ICD-10-CM

## 2019-06-11 DIAGNOSIS — I480 Paroxysmal atrial fibrillation: Secondary | ICD-10-CM

## 2019-06-11 NOTE — Patient Instructions (Signed)
Medication Instructions:  No changes today If you need a refill on your cardiac medications before your next appointment, please call your pharmacy.   Lab work: Today: bmet, cbc, bnp, lipids, tsh If you have labs (blood work) drawn today and your tests are completely normal, you will receive your results only by: Marland Kitchen MyChart Message (if you have MyChart) OR . A paper copy in the mail If you have any lab test that is abnormal or we need to change your treatment, we will call you to review the results.  Testing/Procedures: none  Follow-Up: At [facility], you and your health needs are our priority.  As part of our continuing mission to provide you with exceptional heart care, we have created designated Provider Care Teams.  These Care Teams include your primary Cardiologist (physician) and Advanced Practice Providers (APPs -  Physician Assistants and Nurse Practitioners) who all work together to provide you with the care you need, when you need it. You will need a follow up appointment in:  4 months.  Please call our office 2 months in advance to schedule this appointment.  You may see [name], MD or one of the following Advanced Practice Providers on your designated Care Team: Richardson Dopp, PA-C [place], [place] . Daune Perch, NP  Any Other Special Instructions Will Be Listed Below (If Applicable).

## 2019-06-12 LAB — BASIC METABOLIC PANEL
BUN/Creatinine Ratio: 22 (ref 10–24)
BUN: 25 mg/dL (ref 8–27)
CO2: 26 mmol/L (ref 20–29)
Calcium: 8.7 mg/dL (ref 8.6–10.2)
Chloride: 100 mmol/L (ref 96–106)
Creatinine, Ser: 1.14 mg/dL (ref 0.76–1.27)
GFR calc Af Amer: 66 mL/min/{1.73_m2} (ref >59)
GFR calc non Af Amer: 57 mL/min/{1.73_m2} — ABNORMAL LOW (ref >59)
Glucose: 101 mg/dL — ABNORMAL HIGH (ref 65–99)
Potassium: 4 mmol/L (ref 3.5–5.2)
Sodium: 138 mmol/L (ref 134–144)

## 2019-06-12 LAB — LIPID PANEL
Chol/HDL Ratio: 4.3 ratio (ref 0.0–5.0)
Cholesterol, Total: 134 mg/dL (ref 100–199)
HDL: 31 mg/dL — ABNORMAL LOW (ref >39)
LDL Chol Calc (NIH): 62 mg/dL (ref 0–99)
Triglycerides: 254 mg/dL — ABNORMAL HIGH (ref 0–149)
VLDL Cholesterol Cal: 41 mg/dL — ABNORMAL HIGH (ref 5–40)

## 2019-06-12 LAB — CBC
Hematocrit: 40.7 % (ref 37.5–51.0)
Hemoglobin: 13.8 g/dL (ref 13.0–17.7)
MCH: 31.4 pg (ref 26.6–33.0)
MCHC: 33.9 g/dL (ref 31.5–35.7)
MCV: 93 fL (ref 79–97)
Platelets: 215 10*3/uL (ref 150–450)
RBC: 4.4 x10E6/uL (ref 4.14–5.80)
RDW: 12 % (ref 11.6–15.4)
WBC: 8.7 10*3/uL (ref 3.4–10.8)

## 2019-06-12 LAB — TSH: TSH: 6.89 u[IU]/mL — ABNORMAL HIGH (ref 0.450–4.500)

## 2019-06-12 LAB — PRO B NATRIURETIC PEPTIDE: NT-Pro BNP: 656 pg/mL — ABNORMAL HIGH (ref 0–486)

## 2019-06-15 ENCOUNTER — Telehealth: Payer: Self-pay

## 2019-06-15 DIAGNOSIS — I5042 Chronic combined systolic (congestive) and diastolic (congestive) heart failure: Secondary | ICD-10-CM

## 2019-06-15 DIAGNOSIS — R3912 Poor urinary stream: Secondary | ICD-10-CM | POA: Diagnosis not present

## 2019-06-15 DIAGNOSIS — C61 Malignant neoplasm of prostate: Secondary | ICD-10-CM | POA: Diagnosis not present

## 2019-06-15 DIAGNOSIS — I480 Paroxysmal atrial fibrillation: Secondary | ICD-10-CM

## 2019-06-15 NOTE — Telephone Encounter (Signed)
LMTCB

## 2019-06-15 NOTE — Telephone Encounter (Signed)
-----   Message from [name], MD sent at 06/14/2019  1:46 PM EDT ----- CBC is normal  LIpids:   LDL is great Thyroid function:   Minimally abnormal:   Would when comes in for labs next check free T3, Free T4 Fluid up some but not bad Kidney function is OK    He is right now taking torsemide 2 tabs in am; 1 tab in PM    REcomm:  Take 2 tabs in am and 2 tabs in PM 3 times per week   (say Mon and Thursday)    Watch salt Check BMET and BNP in 1 month   Thyroid testing as noted above

## 2019-06-16 DIAGNOSIS — Z85828 Personal history of other malignant neoplasm of skin: Secondary | ICD-10-CM | POA: Diagnosis not present

## 2019-06-16 DIAGNOSIS — L570 Actinic keratosis: Secondary | ICD-10-CM | POA: Diagnosis not present

## 2019-06-16 DIAGNOSIS — L820 Inflamed seborrheic keratosis: Secondary | ICD-10-CM | POA: Diagnosis not present

## 2019-06-16 MED ORDER — TORSEMIDE 20 MG PO TABS: ORAL_TABLET | ORAL | 3 refills | Status: DC

## 2019-06-16 NOTE — Telephone Encounter (Signed)
Called patient back and reviewed results/recommendations from Dr. Harrington Challenger. He verbalizes understanding to take an extra afternoon dose of torsemide twice weekly (Mon and Thurs) per Dr. Harrington Challenger and repeat lab work in 1 month, including looking at free t3, free t4 for elevated TSH.  Lab appointment made.

## 2019-06-16 NOTE — Telephone Encounter (Signed)
Follow Up:     Patient returning a call back from yesterday about some results. Please call patient.

## 2019-06-16 NOTE — Telephone Encounter (Signed)
° ° °  Daughter [name] calling to discuss lab results.

## 2019-06-18 NOTE — Telephone Encounter (Addendum)
Reviewed results with patient's daughter in much detail, including pro bnp, triglycerides levels and tsh.   She stated patient cries "at the drop of a hat" and his PCP put him on an antidepressant but he will not take it.  Adv f/u thyroid studies will be drawn at next lab draw. All of her questions/concerns were addressed and she has no further questions at this time.

## 2019-06-22 ENCOUNTER — Encounter: Payer: Medicare Other | Admitting: *Deleted

## 2019-07-13 ENCOUNTER — Ambulatory Visit (INDEPENDENT_AMBULATORY_CARE_PROVIDER_SITE_OTHER): Payer: Medicare Other | Admitting: *Deleted

## 2019-07-13 DIAGNOSIS — I255 Ischemic cardiomyopathy: Secondary | ICD-10-CM | POA: Diagnosis not present

## 2019-07-13 DIAGNOSIS — R55 Syncope and collapse: Secondary | ICD-10-CM

## 2019-07-13 LAB — CUP PACEART REMOTE DEVICE CHECK
Battery Remaining Longevity: 100 mo
Battery Remaining Percentage: 95.5 %
Battery Voltage: 2.99 V
Brady Statistic AP VP Percent: 91 %
Brady Statistic AP VS Percent: 1 %
Brady Statistic AS VP Percent: 8.7 %
Brady Statistic AS VS Percent: 1 %
Brady Statistic RA Percent Paced: 79 %
Brady Statistic RV Percent Paced: 99 %
Date Time Interrogation Session: 20201110134058
Implantable Lead Implant Date: 20170202
Implantable Lead Implant Date: 20170202
Implantable Lead Location: 753859
Implantable Lead Location: 753860
Implantable Pulse Generator Implant Date: 20170202
Lead Channel Impedance Value: 380 Ohm
Lead Channel Impedance Value: 400 Ohm
Lead Channel Pacing Threshold Amplitude: 0.875 V
Lead Channel Pacing Threshold Amplitude: 1.75 V
Lead Channel Pacing Threshold Pulse Width: 0.4 ms
Lead Channel Pacing Threshold Pulse Width: 0.4 ms
Lead Channel Sensing Intrinsic Amplitude: 2.6 mV
Lead Channel Sensing Intrinsic Amplitude: 7.9 mV
Lead Channel Setting Pacing Amplitude: 1.125
Lead Channel Setting Pacing Amplitude: 2.5 V
Lead Channel Setting Pacing Pulse Width: 0.4 ms
Lead Channel Setting Sensing Sensitivity: 2 mV
Pulse Gen Model: 2240
Pulse Gen Serial Number: 7864186

## 2019-07-14 ENCOUNTER — Other Ambulatory Visit: Payer: Self-pay

## 2019-07-14 ENCOUNTER — Other Ambulatory Visit: Payer: Medicare Other | Admitting: *Deleted

## 2019-07-14 DIAGNOSIS — I5042 Chronic combined systolic (congestive) and diastolic (congestive) heart failure: Secondary | ICD-10-CM

## 2019-07-14 DIAGNOSIS — I480 Paroxysmal atrial fibrillation: Secondary | ICD-10-CM | POA: Diagnosis not present

## 2019-07-14 LAB — BASIC METABOLIC PANEL
BUN/Creatinine Ratio: 18 (ref 10–24)
BUN: 23 mg/dL (ref 8–27)
CO2: 22 mmol/L (ref 20–29)
Calcium: 8.9 mg/dL (ref 8.6–10.2)
Chloride: 100 mmol/L (ref 96–106)
Creatinine, Ser: 1.3 mg/dL — ABNORMAL HIGH (ref 0.76–1.27)
GFR calc Af Amer: 56 mL/min/{1.73_m2} — ABNORMAL LOW (ref >59)
GFR calc non Af Amer: 49 mL/min/{1.73_m2} — ABNORMAL LOW (ref >59)
Glucose: 104 mg/dL — ABNORMAL HIGH (ref 65–99)
Potassium: 4.2 mmol/L (ref 3.5–5.2)
Sodium: 139 mmol/L (ref 134–144)

## 2019-07-14 LAB — PRO B NATRIURETIC PEPTIDE: NT-Pro BNP: 989 pg/mL — ABNORMAL HIGH (ref 0–486)

## 2019-07-14 LAB — T4, FREE: Free T4: 1.07 ng/dL (ref 0.82–1.77)

## 2019-07-14 LAB — T3, FREE: T3, Free: 2.9 pg/mL (ref 2.0–4.4)

## 2019-08-03 ENCOUNTER — Other Ambulatory Visit: Payer: Self-pay

## 2019-08-03 ENCOUNTER — Emergency Department (HOSPITAL_COMMUNITY): Payer: Medicare Other

## 2019-08-03 ENCOUNTER — Emergency Department (HOSPITAL_COMMUNITY)
Admission: EM | Admit: 2019-08-03 | Discharge: 2019-08-04 | Disposition: A | Discharge status: Home / Self Care | Payer: Medicare Other | Attending: Emergency Medicine | Admitting: Emergency Medicine

## 2019-08-03 DIAGNOSIS — Z5321 Procedure and treatment not carried out due to patient leaving prior to being seen by health care provider: Secondary | ICD-10-CM | POA: Diagnosis not present

## 2019-08-03 DIAGNOSIS — R0602 Shortness of breath: Secondary | ICD-10-CM | POA: Diagnosis not present

## 2019-08-03 DIAGNOSIS — I509 Heart failure, unspecified: Secondary | ICD-10-CM | POA: Insufficient documentation

## 2019-08-03 DIAGNOSIS — Z20828 Contact with and (suspected) exposure to other viral communicable diseases: Secondary | ICD-10-CM | POA: Insufficient documentation

## 2019-08-03 DIAGNOSIS — J40 Bronchitis, not specified as acute or chronic: Secondary | ICD-10-CM | POA: Diagnosis not present

## 2019-08-03 LAB — CBC
HCT: 43.6 % (ref 39.0–52.0)
Hemoglobin: 13.8 g/dL (ref 13.0–17.0)
MCH: 31.4 pg (ref 26.0–34.0)
MCHC: 31.7 g/dL (ref 30.0–36.0)
MCV: 99.3 fL (ref 80.0–100.0)
Platelets: 231 10*3/uL (ref 150–400)
RBC: 4.39 MIL/uL (ref 4.22–5.81)
RDW: 13.1 % (ref 11.5–15.5)
WBC: 9.1 10*3/uL (ref 4.0–10.5)
nRBC: 0 % (ref 0.0–0.2)

## 2019-08-03 LAB — BASIC METABOLIC PANEL
Anion gap: 15 (ref 5–15)
BUN: 25 mg/dL — ABNORMAL HIGH (ref 8–23)
CO2: 26 mmol/L (ref 22–32)
Calcium: 8.9 mg/dL (ref 8.9–10.3)
Chloride: 98 mmol/L (ref 98–111)
Creatinine, Ser: 1.57 mg/dL — ABNORMAL HIGH (ref 0.61–1.24)
GFR calc Af Amer: 45 mL/min — ABNORMAL LOW (ref >60)
GFR calc non Af Amer: 39 mL/min — ABNORMAL LOW (ref >60)
Glucose, Bld: 133 mg/dL — ABNORMAL HIGH (ref 70–99)
Potassium: 3.9 mmol/L (ref 3.5–5.1)
Sodium: 139 mmol/L (ref 135–145)

## 2019-08-03 LAB — PROTIME-INR
INR: 1.2 (ref 0.8–1.2)
Prothrombin Time: 14.9 seconds (ref 11.4–15.2)

## 2019-08-03 LAB — BRAIN NATRIURETIC PEPTIDE: B Natriuretic Peptide: 65.9 pg/mL (ref 0.0–100.0)

## 2019-08-03 MED ORDER — SODIUM CHLORIDE 0.9% FLUSH: 3.0000 mL | Freq: Once | INTRAVENOUS | Status: DC

## 2019-08-03 NOTE — Progress Notes (Signed)
Remote pacemaker transmission.   

## 2019-08-03 NOTE — ED Triage Notes (Signed)
Pt here for shob at rest and with exertion x 1 week. Hx of CHF, has swollen legs. Denies chest pain. Takes 20 mg Lasix BID, but took 40 mg just PTA.

## 2019-08-04 ENCOUNTER — Emergency Department (HOSPITAL_COMMUNITY)
Admission: EM | Admit: 2019-08-04 | Discharge: 2019-08-04 | Disposition: A | Discharge status: Home / Self Care | Payer: Medicare Other | Source: Home / Self Care | Attending: Emergency Medicine | Admitting: Emergency Medicine

## 2019-08-04 ENCOUNTER — Emergency Department (HOSPITAL_COMMUNITY): Payer: Medicare Other

## 2019-08-04 ENCOUNTER — Telehealth: Payer: Self-pay | Admitting: Internal Medicine

## 2019-08-04 ENCOUNTER — Encounter (HOSPITAL_COMMUNITY): Payer: Self-pay | Admitting: Family Medicine

## 2019-08-04 DIAGNOSIS — J40 Bronchitis, not specified as acute or chronic: Secondary | ICD-10-CM

## 2019-08-04 DIAGNOSIS — R0602 Shortness of breath: Secondary | ICD-10-CM | POA: Diagnosis not present

## 2019-08-04 DIAGNOSIS — Z20828 Contact with and (suspected) exposure to other viral communicable diseases: Secondary | ICD-10-CM | POA: Diagnosis not present

## 2019-08-04 LAB — CBC WITH DIFFERENTIAL/PLATELET
Abs Immature Granulocytes: 0.04 10*3/uL (ref 0.00–0.07)
Basophils Absolute: 0 10*3/uL (ref 0.0–0.1)
Basophils Relative: 1 %
Eosinophils Absolute: 0.3 10*3/uL (ref 0.0–0.5)
Eosinophils Relative: 4 %
HCT: 42.4 % (ref 39.0–52.0)
Hemoglobin: 13.5 g/dL (ref 13.0–17.0)
Immature Granulocytes: 1 %
Lymphocytes Relative: 13 %
Lymphs Abs: 1.1 10*3/uL (ref 0.7–4.0)
MCH: 31.3 pg (ref 26.0–34.0)
MCHC: 31.8 g/dL (ref 30.0–36.0)
MCV: 98.4 fL (ref 80.0–100.0)
Monocytes Absolute: 1 10*3/uL (ref 0.1–1.0)
Monocytes Relative: 12 %
Neutro Abs: 5.8 10*3/uL (ref 1.7–7.7)
Neutrophils Relative %: 69 %
Platelets: 220 10*3/uL (ref 150–400)
RBC: 4.31 MIL/uL (ref 4.22–5.81)
RDW: 13.2 % (ref 11.5–15.5)
WBC: 8.3 10*3/uL (ref 4.0–10.5)
nRBC: 0 % (ref 0.0–0.2)

## 2019-08-04 LAB — BASIC METABOLIC PANEL
Anion gap: 11 (ref 5–15)
BUN: 27 mg/dL — ABNORMAL HIGH (ref 8–23)
CO2: 28 mmol/L (ref 22–32)
Calcium: 8.7 mg/dL — ABNORMAL LOW (ref 8.9–10.3)
Chloride: 101 mmol/L (ref 98–111)
Creatinine, Ser: 1.2 mg/dL (ref 0.61–1.24)
GFR calc Af Amer: >60 mL/min (ref >60)
GFR calc non Af Amer: 54 mL/min — ABNORMAL LOW (ref >60)
Glucose, Bld: 104 mg/dL — ABNORMAL HIGH (ref 70–99)
Potassium: 3.8 mmol/L (ref 3.5–5.1)
Sodium: 140 mmol/L (ref 135–145)

## 2019-08-04 LAB — TROPONIN I (HIGH SENSITIVITY): Troponin I (High Sensitivity): 12 ng/L (ref <18)

## 2019-08-04 LAB — D-DIMER, QUANTITATIVE: D-Dimer, Quant: 0.58 ug/mL-FEU — ABNORMAL HIGH (ref 0.00–0.50)

## 2019-08-04 LAB — POC SARS CORONAVIRUS 2 AG -  ED: SARS Coronavirus 2 Ag: NEGATIVE

## 2019-08-04 LAB — BRAIN NATRIURETIC PEPTIDE: B Natriuretic Peptide: 54.1 pg/mL (ref 0.0–100.0)

## 2019-08-04 MED ORDER — AZITHROMYCIN 250 MG PO TABS: 250.0000 mg | ORAL_TABLET | Freq: Every day | ORAL | 0 refills | Status: DC

## 2019-08-04 NOTE — ED Provider Notes (Signed)
[facility] DEPT Provider Note   CSN: [id number] Arrival date & time: 08/04/19  1206     History   Chief Complaint Chief Complaint  Patient presents with  . Shortness of Breath    HPI Levi [name] is a 85 y.o. male.  Presents to the emergency room with a chief complaint of shortness of breath.  Patient states for the past month he has noted mild increased work of breathing, worse with exertion.  States this is worsened over the past week or so, has had mild nonproductive cough.  No significant change in his cough.  No fevers.  No sick contacts.  No associated chest pain, no leg pain or leg swelling.     HPI  Past Medical History:  Diagnosis Date  . Asthma   . Benign neoplasm of colon   . CAD (coronary artery disease) Dec 2011   s/p CABG per Dr. Roxy Manns; had normal EF; All SVGs occluded per follow up cath with only LIMA to LAD patent; s/p PCI of the proximal and mid LAD February 2014 per Dr. Burt Knack  . Chronic combined systolic and diastolic CHF (congestive heart failure) ([place])   . CKD (chronic kidney disease), stage II   . Emphysema    "never treated for it" (10/05/2015)  . History of hiatal hernia   . HLD (hyperlipidemia)   . HTN (hypertension)   . Malaise and fatigue   . Obesity   . Osteopenia   . PAF (paroxysmal atrial fibrillation) ([place])   . Pneumonia 1960s X 1; 1990s X 1  . Presence of permanent cardiac pacemaker   . Prostate cancer ([place])   . Skin cancer    "face, nose, top of ears, scalp"  . Symptomatic bradycardia    STJ PPM, 10/05/15, Dr. Lovena Le    Patient Active Problem List   Diagnosis Date Noted  . Dizziness 03/17/2019  . Acute on chronic combined systolic (congestive) and diastolic (congestive) heart failure ([place]) 03/15/2019  . CKD (chronic kidney disease) stage 2, GFR 60-89 ml/min 10/09/2018  . Ischemic cardiomyopathy 10/06/2018  . Presence of permanent cardiac pacemaker 06/08/2018  . Orthostatic hypotension 08/14/2017   . Chronic combined systolic and diastolic CHF (congestive heart failure) ([place]) 06/09/2017  . Obesity (BMI 30-39.9) 02/19/2017  . OSA (obstructive sleep apnea) 02/19/2017  . Decreased hearing of both ears 12/24/2016  . Depression, reactive 12/06/2016  . Mobitz type 2 second degree atrioventricular block 10/05/2015  . Chronic anticoagulation 09/19/2015  . Bifascicular block 09/19/2015  . Bradycardia 09/19/2015  . Syncope 06/30/2014  . Lumbosacral spondylosis 02/21/2014  . Personal history of nicotine dependence 10/29/2013  . Personal history of tobacco use, presenting hazards to health 10/29/2013  . H/O coronary artery bypass surgery 10/22/2012  . Angina, class III ([place]) 10/15/2012  . CA of prostate ([place]) 07/09/2012  . Gastro-esophageal reflux disease with esophagitis 07/09/2012  . Allergic rhinitis 06/15/2012  . Back pain 10/24/2011  . CAD (coronary artery disease) 06/05/2011  . Chronic coronary artery disease 06/05/2011  . PAF (paroxysmal atrial fibrillation) ([place]) 09/20/2010  . PROSTATE CANCER 12/27/2009  . ADENOMATOUS COLONIC POLYP 12/27/2009  . HLD (hyperlipidemia) 12/27/2009  . Morbid obesity ([place]) 12/27/2009  . Essential hypertension 12/27/2009  . EMPHYSEMA 12/27/2009  . OSTEOPENIA 12/27/2009  . MALAISE AND FATIGUE 12/27/2009  . Heart failure with preserved ejection fraction ([place]) 12/27/2009  . CHEST PAIN UNSPECIFIED 12/27/2009  . History of colonic polyps 12/04/2009    Past Surgical History:  Procedure Laterality Date  .  ANTERIOR CERVICAL DECOMP/DISCECTOMY FUSION    . BACK SURGERY    . C-EYE SURGERY PROCEDURE    . CATARACT EXTRACTION Right   . CORONARY ANGIOPLASTY WITH STENT PLACEMENT  10/14/12   with stent to proximal and mid LAD  . CORONARY ARTERY BYPASS GRAFT  08/23/10   "CABG X4"  . EP IMPLANTABLE DEVICE N/A 10/05/2015   Procedure: Pacemaker Implant;  Surgeon: [name], MD;  Location: [facility] LAB;  Service: Cardiovascular;  Laterality: N/A;  . EP  IMPLANTABLE DEVICE N/A 10/06/2015   Procedure: Lead Revision/Repair;  Surgeon: [name], MD;  Location: [facility] LAB;  Service: Cardiovascular;  Laterality: N/A;  . EYE SURGERY    . INSERT / REPLACE / REMOVE PACEMAKER  10/05/2015  . LEFT HEART CATHETERIZATION WITH CORONARY/GRAFT ANGIOGRAM N/A 09/01/2012   Procedure: LEFT HEART CATHETERIZATION WITH [name];  Surgeon: [name], MD;  Location: [facility] CATH LAB;  Service: Cardiovascular;  Laterality: N/A;  . MEDIAN STERNOTOMY  08/23/10  . MOHS SURGERY  "@ least twice"  . PACEMAKER INSERTION     STJ 10/05/15  . PERCUTANEOUS CORONARY STENT INTERVENTION (PCI-S) N/A 10/14/2012   Procedure: PERCUTANEOUS CORONARY STENT INTERVENTION (PCI-S);  Surgeon: [name], MD;  Location: [facility] CATH LAB;  Service: Cardiovascular;  Laterality: N/A;  . PROSTATE BIOPSY    . TRANSURETHRAL RESECTION OF PROSTATE          Home Medications    Prior to Admission medications   Medication Sig Start Date End Date Taking? Authorizing Provider  apixaban (ELIQUIS) 5 MG TABS tablet Take 1 tablet (5 mg total) by mouth 2 (two) times daily. 09/15/18   [name], MD  cetirizine (ZYRTEC) 10 MG tablet Take 10 mg by mouth daily.    [provider]  Melatonin 5 MG TABS Take 5 mg by mouth as needed (sleep).  08/07/18   [provider]  nitroGLYCERIN (NITROSTAT) 0.4 MG SL tablet Place 0.4 mg under the tongue every 5 (five) minutes x 3 doses as needed for chest pain. 08/09/13   [name], MD  polyethylene glycol (MIRALAX / GLYCOLAX) 17 g packet Take 17 g by mouth daily.    [provider]  potassium chloride SA (K-DUR) 20 MEQ tablet Take 1 tablet (20 mEq total) by mouth 2 (two) times daily. 04/26/19   [name], MD  pravastatin (PRAVACHOL) 20 MG tablet TAKE 1 TABLET BY MOUTH  DAILY 01/27/19   Richardson Dopp T, PA-C  tamsulosin (FLOMAX) 0.4 MG CAPS capsule Take 0.4 mg by mouth daily.    [provider]  torsemide  (DEMADEX) 20 MG tablet Take 2 tabs (40 mg) in the morning and 1 tab (20 mg) in the early afternoon. Every Monday and Thurs take one extra tablet in afternoon. 06/16/19   [name], MD    Family History Family History  Problem Relation Age of Onset  . Kidney cancer Father   . Heart attack Father   . Hypertension Mother   . Stroke Mother   . Heart disease Brother   . Skin cancer Brother   . Prostate cancer Brother   . Allergic rhinitis Neg Hx   . Asthma Neg Hx   . Eczema Neg Hx   . Urticaria Neg Hx     Social History Social History   Tobacco Use  . Smoking status: Former Smoker    Packs/day: 1.00    Years: 30.00    Pack years:  30.00    Types: Cigarettes    Quit date: 09/07/1975    Years since quitting: 43.9  . Smokeless tobacco: Former Systems developer    Types: Chew    Quit date: 07/27/2015  Substance Use Topics  . Alcohol use: No    Alcohol/week: 0.0 standard drinks  . Drug use: No     Allergies   Patient has no known allergies.   Review of Systems Review of Systems  Constitutional: Negative for chills and fever.  HENT: Negative for ear pain and sore throat.   Eyes: Negative for pain and visual disturbance.  Respiratory: Positive for cough and shortness of breath.   Cardiovascular: Negative for chest pain and palpitations.  Gastrointestinal: Negative for abdominal pain and vomiting.  Genitourinary: Negative for dysuria and hematuria.  Musculoskeletal: Negative for arthralgias and back pain.  Skin: Negative for color change and rash.  Neurological: Negative for seizures and syncope.  All other systems reviewed and are negative.    Physical Exam Updated Vital Signs BP 120/66 (BP Location: Right Arm) Comment (BP Location): Lower  Pulse 72   Temp 98.7 F (37.1 C) (Oral)   Resp 20   Ht 5\' 9"  (1.753 m)   Wt 131.1 kg   SpO2 95%   BMI 42.68 kg/m   Physical Exam Vitals signs and nursing note reviewed.  Constitutional:      Appearance: He is well-developed.   HENT:     Head: Normocephalic and atraumatic.  Eyes:     Conjunctiva/sclera: Conjunctivae normal.  Neck:     Musculoskeletal: Neck supple.  Cardiovascular:     Rate and Rhythm: Normal rate and regular rhythm.     Heart sounds: No murmur.  Pulmonary:     Effort: Pulmonary effort is normal. No respiratory distress.     Breath sounds: Normal breath sounds.  Abdominal:     Palpations: Abdomen is soft.     Tenderness: There is no abdominal tenderness.  Musculoskeletal:     Right lower leg: No edema.     Left lower leg: No edema.     Comments: No swelling or tenderness to palpation of lower legs, thighs  Skin:    General: Skin is warm and dry.     Capillary Refill: Capillary refill takes less than 2 seconds.  Neurological:     General: No focal deficit present.     Mental Status: He is alert.  Psychiatric:        Mood and Affect: Mood normal.        Behavior: Behavior normal.      ED Treatments / Results  Labs (all labs ordered are listed, but only abnormal results are displayed) Labs Reviewed - No data to display  EKG EKG Interpretation  Date/Time:  Wednesday August 04 2019 12:23:53 EST Ventricular Rate:  70 PR Interval:    QRS Duration: 170 QT Interval:  446 QTC Calculation: 482 R Axis:   -74 Text Interpretation: AV Paced Rhythm Confirmed by Veryl Speak [phone]) on 08/04/2019 12:45:06 PM   Radiology Dg Chest 2 View  Result Date: 08/03/2019 CLINICAL DATA:  85 year old male with history of shortness of breath. EXAM: CHEST - 2 VIEW COMPARISON:  Chest x-ray 11/11/2018. FINDINGS: Diffuse peribronchial cuffing and patchy areas of interstitial prominence, most evident throughout the mid to lower lungs bilaterally. No pleural effusions. No evidence of pulmonary edema. Heart size is normal. Upper mediastinal contours are within normal limits. Aortic atherosclerosis. Status post median sternotomy for CABG. Status post left atrial  appendage ligation. Left-sided pacemaker  device in place with lead tips projecting over the expected location of the right atrium and right ventricular apex. Orthopedic fixation hardware in the lower cervical spine incidentally noted. IMPRESSION: 1. Areas of peribronchial cuffing and interstitial prominence most evident throughout the mid to lower lungs bilaterally concerning for bronchitis and potential developing multilobar bronchopneumonia. 2. Aortic atherosclerosis. 3. Postoperative changes and support apparatus, as above. Electronically Signed   By: Vinnie Langton M.D.   On: 08/03/2019 18:42    Procedures Procedures (including critical care time)  Medications Ordered in ED Medications - No data to display   Initial Impression / Assessment and Plan / ED Course  I have reviewed the triage vital signs and the nursing notes.  Pertinent labs & imaging results that were available during my care of the patient were reviewed by me and considered in my medical decision making (see chart for details).  Clinical Course as of Aug 03 1518  Wed Aug 04, 2019  1518 Recheck patient, did very well on ambulation trial in room, will discharge home   [RD]    Clinical Course User Index [RD] Lucrezia Starch, MD       85 year old male with past medical history of heart failure with preserved ejection fraction, hypertension, coronary artery disease s/p CABG, A. fib on Eliquis presents to ER with chief complaint of shortness of breath.  On exam patient, no ongoing shortness of breath department.  He is well-appearing with normal vital signs.  Chest x-ray today demonstrates peribronchial cuffing concerning for bronchitis but no obvious pneumonia.  No changes since CXR yesterday.  His labs remained stable, no elevation in BNP.  Checked D-dimer to rule out PE, this is within normal limits when adjusted for age.  EKG without acute ischemic changes, troponin within normal limits.  Patient did well on ambulation trial, given a forementioned work-up,  believe patient is appropriate for discharge and outpatient management this time.  Will give Rx for Z-Pak given possible bronchitis.  Will send PCR Covid.  Recommend close recheck with primary doctor given extensive past medical history.    After the discussed management above, the patient was determined to be safe for discharge.  The patient was in agreement with this plan and all questions regarding their care were answered.  ED return precautions were discussed and the patient will return to the ED with any significant worsening of condition.    Final Clinical Impressions(s) / ED Diagnoses   Final diagnoses:  Bronchitis    ED Discharge Orders    None       Lucrezia Starch, MD 08/04/19 [phone]

## 2019-08-04 NOTE — ED Notes (Signed)
Patient ambulated in the room with pulse ox, oxygen saturation ranged from 94-97% RA and improved with ambulation.

## 2019-08-04 NOTE — Telephone Encounter (Signed)
Pt c/o Shortness Of Breath: STAT if SOB developed within the last 24 hours or pt is noticeably SOB on the phone  1. Are you currently SOB (can you hear that pt is SOB on the phone)? Pt had to go to the  [facility] ER around 5:30- Pt had lab work, Production manager and EKG-stayed until !:20 A.M.- never saw a doctor  2. How long have you been experiencing SOB? Yesterday was worse yesterday- he suffer with it a lot  3. Are you SOB when sitting or when up moving around? Moving around  4. Are you currently experiencing any other symptoms? Pt's daughter wants pt to be seen by somebody today please

## 2019-08-04 NOTE — Discharge Instructions (Signed)
Recommend taking the antibiotic as prescribed.  Recommend calling your primary doctor to schedule a follow-up appointment for tomorrow or Friday for close recheck.  If you develop worsening difficulty breathing, any chest pain or other new concerning symptom recommend return to ER for reassessment.

## 2019-08-04 NOTE — ED Notes (Signed)
ED Provider at bedside. 

## 2019-08-04 NOTE — ED Triage Notes (Signed)
Patient is from home and complaining of shortness of breath at rest and with exertion for the last week. He was triaged last night with labs, and chest x-ray at [facility], but left due to a long wait. Patient's family has contacted cardiology who recommends patient return back to the emergency department due to fluid overload and possible COVID test. Denies chest pain.

## 2019-08-04 NOTE — Telephone Encounter (Signed)
Pt reports bilateral LE swelling and severe SOB. (see patient msg from 12/2 to Dr. Harrington Challenger for details) Pt went to the ED last night and got labs, xray but left without being see by a physician d/t long wait time.    Spoke with [name], pts daughter in depth regarding patients labs from last night in the ED as well as his Chest Xray. I let [name] know that we do not have any openings today and he should been seen in the ED so he can be treated and get a COVID-19 test.  I advised [name] that pt was not fluid overloaded according to his BNP from last night & let her know that his chest xray was concerning. [name] thanked me for the information. She is going to try to get pt in with PCP and if that does not work then she will try urgent care as they do not want to go back to the ED and wait. I reiterated that his PCP and urgent care would more than likely send him to the ED. She verbalized understanding.

## 2019-08-04 NOTE — ED Notes (Signed)
Patient transported to X-ray 

## 2019-08-04 NOTE — ED Notes (Signed)
Pt and familly members indicated that they were leaving and would follow up w/ PCP in the AM.  RN encouraged pt to follow up and return should they need to.

## 2019-08-05 LAB — NOVEL CORONAVIRUS, NAA (HOSP ORDER, SEND-OUT TO REF LAB; TAT 18-24 HRS): SARS-CoV-2, NAA: NOT DETECTED

## 2019-08-09 DIAGNOSIS — I480 Paroxysmal atrial fibrillation: Secondary | ICD-10-CM | POA: Diagnosis not present

## 2019-08-09 DIAGNOSIS — Z09 Encounter for follow-up examination after completed treatment for conditions other than malignant neoplasm: Secondary | ICD-10-CM | POA: Diagnosis not present

## 2019-08-09 DIAGNOSIS — J189 Pneumonia, unspecified organism: Secondary | ICD-10-CM | POA: Diagnosis not present

## 2019-09-15 DIAGNOSIS — Z85828 Personal history of other malignant neoplasm of skin: Secondary | ICD-10-CM | POA: Diagnosis not present

## 2019-09-15 DIAGNOSIS — C44222 Squamous cell carcinoma of skin of right ear and external auricular canal: Secondary | ICD-10-CM | POA: Diagnosis not present

## 2019-09-15 DIAGNOSIS — D485 Neoplasm of uncertain behavior of skin: Secondary | ICD-10-CM | POA: Diagnosis not present

## 2019-09-19 NOTE — Progress Notes (Signed)
Cardiology Office Note   Date:  09/20/2019   ID:  Levi [name], Levi [name] [birth year], MRN [id number]  PCP:  Bernerd Limbo, MD  Cardiologist:   [name], MD   F/U of CHF     History of Present Illness: Levi [name] is a 86 y.o. male with a history ofcoronary artery disease( status post CABG in 2011 and drug-eluting stent x2 to the LAD in 2013), paroxysmal atrial fibrillation, symptomatic bradycardia with Mobitz 2( status post pacemaker implantation in January 2017), combined systolic and diastolic heart failure, hypertension, hyperlipidemia, CKD stage II and OSA on CPAP.  I last saw the pt in October 2020  The pt says his breathing is stable   He still gets SOB with actviity.   No SOB at rest.  He denies CP    No dizziness Watching salt intake    Current Meds  Medication Sig   apixaban (ELIQUIS) 5 MG TABS tablet Take 1 tablet (5 mg total) by mouth 2 (two) times daily.   cetirizine (ZYRTEC) 10 MG tablet Take 10 mg by mouth daily.   Melatonin 5 MG TABS Take 5 mg by mouth as needed (sleep).    nitroGLYCERIN (NITROSTAT) 0.4 MG SL tablet Place 0.4 mg under the tongue every 5 (five) minutes x 3 doses as needed for chest pain.   polyethylene glycol (MIRALAX / GLYCOLAX) 17 g packet Take 17 g by mouth daily.   potassium chloride SA (K-DUR) 20 MEQ tablet Take 1 tablet (20 mEq total) by mouth 2 (two) times daily.   pravastatin (PRAVACHOL) 20 MG tablet TAKE 1 TABLET BY MOUTH  DAILY   tamsulosin (FLOMAX) 0.4 MG CAPS capsule Take 0.4 mg by mouth daily.   torsemide (DEMADEX) 20 MG tablet Take 2 tabs (40 mg) in the morning and 1 tab (20 mg) in the early afternoon. Every Monday and Thurs take one extra tablet in afternoon.     Allergies:   Patient has no known allergies.   Past Medical History:  Diagnosis Date   Asthma    Benign neoplasm of colon    CAD (coronary artery disease) Dec 2011   s/p CABG per Dr. Roxy Manns; had normal EF; All SVGs occluded per follow up cath with  only LIMA to LAD patent; s/p PCI of the proximal and mid LAD February 2014 per Dr. Burt Knack   Chronic combined systolic and diastolic CHF (congestive heart failure) ([place])    CKD (chronic kidney disease), stage II    Emphysema    "never treated for it" (10/05/2015)   History of hiatal hernia    HLD (hyperlipidemia)    HTN (hypertension)    Malaise and fatigue    Obesity    Osteopenia    PAF (paroxysmal atrial fibrillation) ([facility])    Pneumonia 1960s X 1; 1990s X 1   Presence of permanent cardiac pacemaker    Prostate cancer ([place])    Skin cancer    "face, nose, top of ears, scalp"   Symptomatic bradycardia    STJ PPM, 10/05/15, Dr. Lovena Le    Past Surgical History:  Procedure Laterality Date   ANTERIOR CERVICAL DECOMP/DISCECTOMY FUSION     BACK SURGERY     [place] EXTRACTION Right    CORONARY ANGIOPLASTY WITH STENT PLACEMENT  10/14/12   with stent to proximal and mid LAD   CORONARY ARTERY BYPASS GRAFT  08/23/10   "CABG X4"   EP IMPLANTABLE DEVICE N/A 10/05/2015  Procedure: Pacemaker Implant;  Surgeon: [name], MD;  Location: [facility] LAB;  Service: Cardiovascular;  Laterality: N/A;   EP IMPLANTABLE DEVICE N/A 10/06/2015   Procedure: Lead Revision/Repair;  Surgeon: [name], MD;  Location: [facility] LAB;  Service: Cardiovascular;  Laterality: N/A;   EYE SURGERY     INSERT / REPLACE / REMOVE PACEMAKER  10/05/2015   LEFT HEART CATHETERIZATION WITH CORONARY/GRAFT ANGIOGRAM N/A 09/01/2012   Procedure: LEFT HEART CATHETERIZATION WITH [name];  Surgeon: [name], MD;  Location: [facility] CATH LAB;  Service: Cardiovascular;  Laterality: N/A;   MEDIAN STERNOTOMY  08/23/10   MOHS SURGERY  "@ least twice"   PACEMAKER INSERTION     STJ 10/05/15   PERCUTANEOUS CORONARY STENT INTERVENTION (PCI-S) N/A 10/14/2012   Procedure: PERCUTANEOUS CORONARY STENT INTERVENTION (PCI-S);  Surgeon: [name], MD;   Location: [facility] CATH LAB;  Service: Cardiovascular;  Laterality: N/A;   PROSTATE BIOPSY     TRANSURETHRAL RESECTION OF PROSTATE       Social History:  The patient  reports that he quit smoking about 44 years ago. His smoking use included cigarettes. He has a 30.00 pack-year smoking history. He quit smokeless tobacco use about 4 years ago.  His smokeless tobacco use included chew. He reports that he does not drink alcohol or use drugs.   Family History:  The patient's family history includes Heart attack in his father; Heart disease in his brother; Hypertension in his mother; Kidney cancer in his father; Prostate cancer in his brother; Skin cancer in his brother; Stroke in his mother.    ROS:  Please see the history of present illness. All other systems are reviewed and  Negative to the above problem except as noted.    PHYSICAL EXAM: VS:  BP 140/74    Pulse 91    Ht _0  (1.753 m)    Wt 299 lb 9.6 oz (135.9 kg)    SpO2 96%    BMI 44.24 kg/m   Sats 95% rest  [place]th walking 94%   Pt SOB    HR 107   GEN: Morbidly obese 86 yo in no acute distress Examined in chair   HEENT: normal  Neck: Neck is full Cardiac: RRR; no murmurs, rubs, or gallops, Tr to 1+  edema  Respiratory:  Crackles at bases GI: Obese    MS: no deformity Moving all extremities   Skin: warm and dry, no rash Neuro:  Strength and sensation are intact Psych: euthymic mood, full affect   EKG:  EKG is not  ordered today.   Lipid Panel    Component Value Date/Time   CHOL 134 06/11/2019 1638   TRIG 254 (H) 06/11/2019 1638   HDL 31 (L) 06/11/2019 1638   CHOLHDL 4.3 06/11/2019 1638   CHOLHDL 3 06/01/2014 0930   VLDL 23.0 06/01/2014 0930   LDLCALC 62 06/11/2019 1638   LDLDIRECT 70.7 06/10/2013 1344      Wt Readings from Last 3 Encounters:  09/20/19 299 lb 9.6 oz (135.9 kg)  08/04/19 289 lb (131.1 kg)  06/11/19 292 lb 12.8 oz (132.8 kg)    The following studies were reviewed today: Echo 08/28/18 Mild conc LVH, EF  40-45, inf/inf-sept/apical HK, Gr 2 DD, MAC, normal RVSF, mod RAE, increased RA pressure, mild TR  Cardiac catheterization 02/10/2017 ([facility]) Coronary angiography reveals 3-vessel CAD.  LM is diffusely diseased with 50% distal stenosis.  LAD has a 80% mid  instent restenosis,  LCX has serial 50-60% stenoses in the mid segment, OM2 is a small vessel and has a 70% ostial stenosis.  RCA has long diffuse 50-60% proximal and 60% distal stenosis.  LIMA to LAD is patent with a kink in the mid segment. Flow is normal.  LV-Gram reveals EF 50%. LVEDP is 8 mmHg.  SVG grafts are known to be chronically occluded, so not injected   Nuclear stress test 01/21/2017 ([facility]) 1. Moderate to severe inferoapical inducible ischemia with regadenoson 2. Mild to moderate mid inferior and posterior basal defect. Cannot rule out prior infarct. 3. Mild to moderate hypokinesis of the inferior wall. Dyskinesia/akinesia of the apex. 4. Moderately decreased left ventricular ejection fraction at 43%. 5. Please correlate with a cardiac ultrasound     ASSESSMENT AND PLAN:  1  Acute on chronic systolic/diastolic CHF   VOlume is up some but better than in past   He does get dyspneic with walking     WIll check labs    Watch salt    Keep on same meds for now     2   CAD  No symptoms to sugg angina    3  CKD   WIll check labs today      4  HTN   BP control is fair       5  GERD   Rx for protonix  He has chest discomfort in bed  6   COVID   I encouraged pt to get COVID vaccine      F/U in clinic in June   He has appt with [name] later this spring    Current medicines are reviewed at length with the patient today.  The patient does not have concerns regarding medicines.  Signed, [name], MD  09/20/2019 7:53 PM    [address], [place], [place]  [address] Phone: [phone]; Fax: [phone]

## 2019-09-20 ENCOUNTER — Ambulatory Visit (INDEPENDENT_AMBULATORY_CARE_PROVIDER_SITE_OTHER): Payer: Medicare Other | Admitting: Internal Medicine

## 2019-09-20 ENCOUNTER — Encounter: Payer: Self-pay | Admitting: Internal Medicine

## 2019-09-20 ENCOUNTER — Other Ambulatory Visit: Payer: Self-pay

## 2019-09-20 VITALS — BP 140/74 | HR 91 | Ht 69.0 in | Wt 299.6 lb

## 2019-09-20 DIAGNOSIS — I255 Ischemic cardiomyopathy: Secondary | ICD-10-CM | POA: Diagnosis not present

## 2019-09-20 DIAGNOSIS — R7989 Other specified abnormal findings of blood chemistry: Secondary | ICD-10-CM | POA: Diagnosis not present

## 2019-09-20 DIAGNOSIS — I480 Paroxysmal atrial fibrillation: Secondary | ICD-10-CM | POA: Diagnosis not present

## 2019-09-20 MED ORDER — PANTOPRAZOLE SODIUM 40 MG PO TBEC: 40.0000 mg | DELAYED_RELEASE_TABLET | Freq: Every day | ORAL | 11 refills | Status: DC

## 2019-09-20 NOTE — Patient Instructions (Addendum)
Medication Instructions:  Your physician has recommended you make the following change in your medication:  1.) protonix 40 mg (pantoprazole) take one tablet every night at bedtime  *If you need a refill on your cardiac medications before your next appointment, please call your pharmacy*  Lab Work: Today: cbc, bmet, bnp, tsh  If you have labs (blood work) drawn today and your tests are completely normal, you will receive your results only by: Marland Kitchen MyChart Message (if you have MyChart) OR . A paper copy in the mail If you have any lab test that is abnormal or we need to change your treatment, we will call you to review the results.  Testing/Procedures: none  Other Instructions

## 2019-09-21 ENCOUNTER — Telehealth: Payer: Self-pay | Admitting: *Deleted

## 2019-09-21 DIAGNOSIS — E039 Hypothyroidism, unspecified: Secondary | ICD-10-CM

## 2019-09-21 DIAGNOSIS — I5042 Chronic combined systolic (congestive) and diastolic (congestive) heart failure: Secondary | ICD-10-CM

## 2019-09-21 LAB — BASIC METABOLIC PANEL WITH GFR
BUN/Creatinine Ratio: 15 (ref 10–24)
BUN: 21 mg/dL (ref 8–27)
CO2: 24 mmol/L (ref 20–29)
Calcium: 8.9 mg/dL (ref 8.6–10.2)
Chloride: 101 mmol/L (ref 96–106)
Creatinine, Ser: 1.39 mg/dL — ABNORMAL HIGH (ref 0.76–1.27)
GFR calc Af Amer: 52 mL/min/1.73 — ABNORMAL LOW
GFR calc non Af Amer: 45 mL/min/1.73 — ABNORMAL LOW
Glucose: 99 mg/dL (ref 65–99)
Potassium: 4.6 mmol/L (ref 3.5–5.2)
Sodium: 141 mmol/L (ref 134–144)

## 2019-09-21 LAB — PRO B NATRIURETIC PEPTIDE: NT-Pro BNP: 1237 pg/mL — ABNORMAL HIGH (ref 0–486)

## 2019-09-21 LAB — CBC
Hematocrit: 39.6 % (ref 37.5–51.0)
Hemoglobin: 13.1 g/dL (ref 13.0–17.7)
MCH: 31 pg (ref 26.6–33.0)
MCHC: 33.1 g/dL (ref 31.5–35.7)
MCV: 94 fL (ref 79–97)
Platelets: 182 x10E3/uL (ref 150–450)
RBC: 4.22 x10E6/uL (ref 4.14–5.80)
RDW: 12.5 % (ref 11.6–15.4)
WBC: 8.6 x10E3/uL (ref 3.4–10.8)

## 2019-09-21 LAB — TSH: TSH: 8.13 u[IU]/mL — ABNORMAL HIGH (ref 0.450–4.500)

## 2019-09-21 NOTE — Telephone Encounter (Signed)
-----   Message from [name], MD sent at 09/21/2019  2:27 PM EST ----- Kidney funciton is stable   Fluid is up a little more than previous FOr next 3 days take 2 torsemide in AM and 2 in PM then go back to usual schedule    She how helps breathing Thyroid is minimally abnormal Recomm BMET, BNP , free T3, Free T 4 in 2 wks

## 2019-09-21 NOTE — Telephone Encounter (Signed)
Spoke with daughter, DPR on file.  Reviewed labs and recommendations.  Pt will come for labs on 10/05/19.  Daughter verbalized understanding and was appreciative for call.

## 2019-10-04 DIAGNOSIS — Z85828 Personal history of other malignant neoplasm of skin: Secondary | ICD-10-CM | POA: Diagnosis not present

## 2019-10-04 DIAGNOSIS — C44222 Squamous cell carcinoma of skin of right ear and external auricular canal: Secondary | ICD-10-CM | POA: Diagnosis not present

## 2019-10-05 ENCOUNTER — Other Ambulatory Visit: Payer: Medicare Other

## 2019-10-05 ENCOUNTER — Other Ambulatory Visit: Payer: Self-pay

## 2019-10-05 DIAGNOSIS — E039 Hypothyroidism, unspecified: Secondary | ICD-10-CM | POA: Diagnosis not present

## 2019-10-05 DIAGNOSIS — I5042 Chronic combined systolic (congestive) and diastolic (congestive) heart failure: Secondary | ICD-10-CM | POA: Diagnosis not present

## 2019-10-06 LAB — T4, FREE: Free T4: 1.13 ng/dL (ref 0.82–1.77)

## 2019-10-06 LAB — BASIC METABOLIC PANEL
BUN/Creatinine Ratio: 20 (ref 10–24)
BUN: 24 mg/dL (ref 8–27)
CO2: 24 mmol/L (ref 20–29)
Calcium: 8.9 mg/dL (ref 8.6–10.2)
Chloride: 100 mmol/L (ref 96–106)
Creatinine, Ser: 1.22 mg/dL (ref 0.76–1.27)
GFR calc Af Amer: 61 mL/min/{1.73_m2} (ref >59)
GFR calc non Af Amer: 53 mL/min/{1.73_m2} — ABNORMAL LOW (ref >59)
Glucose: 101 mg/dL — ABNORMAL HIGH (ref 65–99)
Potassium: 4.7 mmol/L (ref 3.5–5.2)
Sodium: 140 mmol/L (ref 134–144)

## 2019-10-06 LAB — T3, FREE: T3, Free: 2.8 pg/mL (ref 2.0–4.4)

## 2019-10-06 LAB — PRO B NATRIURETIC PEPTIDE: NT-Pro BNP: 1219 pg/mL — ABNORMAL HIGH (ref 0–486)

## 2019-10-07 ENCOUNTER — Telehealth: Payer: Self-pay | Admitting: Internal Medicine

## 2019-10-07 DIAGNOSIS — I5042 Chronic combined systolic (congestive) and diastolic (congestive) heart failure: Secondary | ICD-10-CM

## 2019-10-07 NOTE — Telephone Encounter (Signed)
New Message    Pts daughter is calling and is wondering about pts lab results [name] says it is fine to leave a message on her voicemail about results and if he is needing any additional medication. She will be driving and may not get the call     Please call

## 2019-10-08 MED ORDER — METOLAZONE 2.5 MG PO TABS: ORAL_TABLET | ORAL | 3 refills | Status: DC

## 2019-10-08 NOTE — Telephone Encounter (Signed)
[name], MD  10/07/2019 8:45 PM EST    Electrolytes and kidney function are normal Thyroid function is OK FLuid remains up  Is he taking 2 torsemide in AM and 1 in PM COuld try taking 2.5 Zaroxylyn 30 min prior  Follow response BMET and BNP in 3 weeks  WATCH SALT   __________________________________________________________________  Levi [name] with patient's daughter and reviewed results. Pt continues to take torsemide (2) in AM and (1) in PM Will add Zaroxolyn 2.5 mg 30 min prior to AM dose.  Confirmed w Dr. Harrington Challenger pt should do this 3 times per week and follow to see if adequate urine response, any decrease in swelling, SOB. She is appreciative for the information and will communicate this with the patient. Lab appointment for 10/29/19 scheduled.

## 2019-10-11 DIAGNOSIS — L570 Actinic keratosis: Secondary | ICD-10-CM | POA: Diagnosis not present

## 2019-10-13 ENCOUNTER — Ambulatory Visit: Payer: Medicare Other

## 2019-10-14 ENCOUNTER — Telehealth: Payer: Self-pay | Admitting: *Deleted

## 2019-10-14 LAB — CUP PACEART REMOTE DEVICE CHECK
Battery Remaining Longevity: 107 mo
Battery Remaining Percentage: 95.5 %
Battery Voltage: 2.99 V
Brady Statistic AP VP Percent: 86 %
Brady Statistic AP VS Percent: 1 %
Brady Statistic AS VP Percent: 14 %
Brady Statistic AS VS Percent: 1 %
Brady Statistic RA Percent Paced: 56 %
Brady Statistic RV Percent Paced: 99 %
Date Time Interrogation Session: 20210210172350
Implantable Lead Implant Date: 20170202
Implantable Lead Implant Date: 20170202
Implantable Lead Location: 753859
Implantable Lead Location: 753860
Implantable Pulse Generator Implant Date: 20170202
Lead Channel Impedance Value: 450 Ohm
Lead Channel Impedance Value: 450 Ohm
Lead Channel Pacing Threshold Amplitude: 1.125 V
Lead Channel Pacing Threshold Amplitude: 1.75 V
Lead Channel Pacing Threshold Pulse Width: 0.4 ms
Lead Channel Pacing Threshold Pulse Width: 0.4 ms
Lead Channel Sensing Intrinsic Amplitude: 0.7 mV
Lead Channel Sensing Intrinsic Amplitude: 8.5 mV
Lead Channel Setting Pacing Amplitude: 1.375
Lead Channel Setting Pacing Amplitude: 2.5 V
Lead Channel Setting Pacing Pulse Width: 0.4 ms
Lead Channel Setting Sensing Sensitivity: 2 mV
Pulse Gen Model: 2240
Pulse Gen Serial Number: 7864186

## 2019-10-14 NOTE — Telephone Encounter (Signed)
Increased AF/A-flutter burden noted on 10/13/19 remote PPM transmission. Trend suggests persistent AF since end of 08/2019, though some stored EGMs show brief termination. V rates controlled. Patient is on Eliquis per med list.  Spoke with pt's daughter, [name] (DPR). She reports pt has had increased SOB for the past month or so, metolazone recently started by Dr. Harrington Challenger and seems to be helping with edema. [name] requests that I call pt directly and provided cell and home numbers.  LMOM at home number requesting call back. Spoke with pt at cell number. He denies awareness of AF/A-flutter, but he reports that he still experiences SOB with exertion despite addition of metolazone. Feels that it is still worse than his baseline. Pt reports compliance with Eliquis, denies missed doses. Routed to Dr. Lovena Le and Dr. Harrington Challenger for review and recommendations. Pt aware he will receive call back with any new recommendations, denies questions or additional concerns at this time.

## 2019-10-15 NOTE — Telephone Encounter (Signed)
Only good rhythm control option would be to take amiodarone 200 mg daily. followup with me in 6 weeks. GT

## 2019-10-17 NOTE — Telephone Encounter (Signed)
Send in Rx for amiodarone 200 mg daily     F?U with [name] in6 wks

## 2019-10-18 ENCOUNTER — Telehealth: Payer: Self-pay | Admitting: Internal Medicine

## 2019-10-18 DIAGNOSIS — R3911 Hesitancy of micturition: Secondary | ICD-10-CM | POA: Diagnosis not present

## 2019-10-18 DIAGNOSIS — C61 Malignant neoplasm of prostate: Secondary | ICD-10-CM | POA: Diagnosis not present

## 2019-10-18 DIAGNOSIS — Z79899 Other long term (current) drug therapy: Secondary | ICD-10-CM

## 2019-10-18 NOTE — Telephone Encounter (Signed)
Stop metalozone. I would like pt to get labs BMET, LFTs, BNP

## 2019-10-18 NOTE — Addendum Note (Signed)
Addended by: Frederik Schmidt on: 10/18/2019 02:46 PM   Modules accepted: Orders

## 2019-10-18 NOTE — Telephone Encounter (Signed)
I spoke to the patient with Dr Harrington Challenger' recommendations.  He will stop Metolazone and get labs drawn (BMET, LFT, BNP) on 2/16.  He verbalized understanding.

## 2019-10-19 ENCOUNTER — Other Ambulatory Visit: Payer: Medicare Other

## 2019-10-19 ENCOUNTER — Other Ambulatory Visit: Payer: Self-pay

## 2019-10-19 DIAGNOSIS — Z79899 Other long term (current) drug therapy: Secondary | ICD-10-CM | POA: Diagnosis not present

## 2019-10-19 DIAGNOSIS — I5042 Chronic combined systolic (congestive) and diastolic (congestive) heart failure: Secondary | ICD-10-CM | POA: Diagnosis not present

## 2019-10-19 LAB — HEPATIC FUNCTION PANEL
ALT: 9 IU/L (ref 0–44)
AST: 19 IU/L (ref 0–40)
Albumin: 4.2 g/dL (ref 3.6–4.6)
Alkaline Phosphatase: 111 IU/L (ref 39–117)
Bilirubin Total: 0.4 mg/dL (ref 0.0–1.2)
Bilirubin, Direct: 0.17 mg/dL (ref 0.00–0.40)
Total Protein: 7.3 g/dL (ref 6.0–8.5)

## 2019-10-19 LAB — BASIC METABOLIC PANEL
BUN/Creatinine Ratio: 28 — ABNORMAL HIGH (ref 10–24)
BUN: 42 mg/dL — ABNORMAL HIGH (ref 8–27)
CO2: 33 mmol/L — ABNORMAL HIGH (ref 20–29)
Calcium: 9.6 mg/dL (ref 8.6–10.2)
Chloride: 84 mmol/L — ABNORMAL LOW (ref 96–106)
Creatinine, Ser: 1.49 mg/dL — ABNORMAL HIGH (ref 0.76–1.27)
GFR calc Af Amer: 48 mL/min/{1.73_m2} — ABNORMAL LOW (ref >59)
GFR calc non Af Amer: 41 mL/min/{1.73_m2} — ABNORMAL LOW (ref >59)
Glucose: 109 mg/dL — ABNORMAL HIGH (ref 65–99)
Potassium: 3.9 mmol/L (ref 3.5–5.2)
Sodium: 136 mmol/L (ref 134–144)

## 2019-10-19 LAB — PRO B NATRIURETIC PEPTIDE: NT-Pro BNP: 969 pg/mL — ABNORMAL HIGH (ref 0–486)

## 2019-10-19 MED ORDER — AMIODARONE HCL 200 MG PO TABS: 200.0000 mg | ORAL_TABLET | Freq: Every day | ORAL | 3 refills | Status: DC

## 2019-10-19 NOTE — Telephone Encounter (Signed)
Called and spoke with patient's daughter, [name].  She asked that amiodarone go to local and mail in pharmacy for cost purposes.    She will have him pick up and start once a day. Follow up appointment for April 7 10:45 am.

## 2019-10-19 NOTE — Addendum Note (Signed)
Addended by: Rodman Key on: 10/19/2019 01:43 PM   Modules accepted: Orders

## 2019-10-21 ENCOUNTER — Telehealth: Payer: Self-pay | Admitting: Internal Medicine

## 2019-10-21 NOTE — Telephone Encounter (Signed)
Patient daughter called, she wants to know if he needs to still come in for his lab appt on 2/26. As well if he if he is to continue not taking his extra fluid, metolazone.

## 2019-10-21 NOTE — Telephone Encounter (Signed)
Metolazone was every Tue, Thur, Sat and was discontinued 2/15 due to weakness.  Labs were drawn 10/19/19. Message to Dr. Harrington Challenger to confirm whether pt is to remain off metolazone and whether lab appointment 10/29/19 is still needed.

## 2019-10-22 ENCOUNTER — Other Ambulatory Visit: Payer: Self-pay | Admitting: *Deleted

## 2019-10-22 ENCOUNTER — Ambulatory Visit: Payer: Medicare Other | Admitting: Internal Medicine

## 2019-10-22 DIAGNOSIS — E039 Hypothyroidism, unspecified: Secondary | ICD-10-CM

## 2019-10-22 NOTE — Telephone Encounter (Signed)
-----   Message from [name] V, MD sent at 10/20/2019  9:09 PM EST ----- Electrolytes and kidney function are relatively stable   Fluid is up but a little better  Liver function is normal  No new recommendations for meds

## 2019-10-22 NOTE — Telephone Encounter (Signed)
Spoke with the patients daughter and pushed back the patients lab appt to 11/03/19.  She thanked me for the call and is aware to call back with any other concerns.

## 2019-10-25 NOTE — Telephone Encounter (Signed)
See MyChart patient message 10/22/19 for further details

## 2019-10-29 ENCOUNTER — Other Ambulatory Visit: Payer: Medicare Other

## 2019-11-01 ENCOUNTER — Telehealth: Payer: Self-pay

## 2019-11-01 MED ORDER — APIXABAN 5 MG PO TABS: 5.0000 mg | ORAL_TABLET | Freq: Two times a day (BID) | ORAL | 1 refills | Status: DC

## 2019-11-01 NOTE — Telephone Encounter (Signed)
Spoke with pharmacist at [facility] rx and advised that Metolazone has been discontinued.  They removed from his medication list and will not request further refills.

## 2019-11-01 NOTE — Telephone Encounter (Signed)
Pt last saw Dr Harrington Challenger 09/20/19, last labs 10/19/19 Creat 1.49, age 86, weight 135.9kg, based on specified criteria pt is currently on appropriate dosage of Eliquis 5mg  BID.  Creat is borderline at 1.49 will continue to monitor may need dosage decrease if continues to be elevated.  Faxed refill request received from [facility] rx, they requested Eliquis which I refilled, but also requesting Metolazone which I do not see on pt's med list will forward to Dr Harrington Challenger and her nurse to address.

## 2019-11-03 ENCOUNTER — Other Ambulatory Visit: Payer: Medicare Other | Admitting: *Deleted

## 2019-11-03 ENCOUNTER — Other Ambulatory Visit: Payer: Self-pay

## 2019-11-03 DIAGNOSIS — E039 Hypothyroidism, unspecified: Secondary | ICD-10-CM

## 2019-11-03 DIAGNOSIS — I5042 Chronic combined systolic (congestive) and diastolic (congestive) heart failure: Secondary | ICD-10-CM

## 2019-11-03 LAB — BASIC METABOLIC PANEL WITH GFR
BUN/Creatinine Ratio: 15 (ref 10–24)
BUN: 22 mg/dL (ref 8–27)
CO2: 24 mmol/L (ref 20–29)
Calcium: 8.8 mg/dL (ref 8.6–10.2)
Chloride: 99 mmol/L (ref 96–106)
Creatinine, Ser: 1.42 mg/dL — ABNORMAL HIGH (ref 0.76–1.27)
GFR calc Af Amer: 51 mL/min/{1.73_m2} — ABNORMAL LOW
GFR calc non Af Amer: 44 mL/min/{1.73_m2} — ABNORMAL LOW
Glucose: 90 mg/dL (ref 65–99)
Potassium: 4.2 mmol/L (ref 3.5–5.2)
Sodium: 139 mmol/L (ref 134–144)

## 2019-11-03 LAB — TSH: TSH: 10.8 u[IU]/mL — ABNORMAL HIGH (ref 0.450–4.500)

## 2019-11-03 LAB — PRO B NATRIURETIC PEPTIDE: NT-Pro BNP: 740 pg/mL — ABNORMAL HIGH (ref 0–486)

## 2019-11-05 NOTE — Telephone Encounter (Signed)
Replied to message re: no medication changes at this time. Forwarded to Dr. Harrington Challenger to review questions re: thyroid. TSH remains elevated. Free T3, Free T4 were normal in 10/2019.

## 2019-11-26 ENCOUNTER — Telehealth: Payer: Self-pay

## 2019-11-26 DIAGNOSIS — E039 Hypothyroidism, unspecified: Secondary | ICD-10-CM

## 2019-11-26 MED ORDER — LEVOTHYROXINE SODIUM 25 MCG PO TABS: 12.5000 ug | ORAL_TABLET | Freq: Every day | ORAL | 0 refills | Status: DC

## 2019-11-26 MED ORDER — AMIODARONE HCL 100 MG PO TABS: 100.0000 mg | ORAL_TABLET | Freq: Every day | ORAL | 0 refills | Status: DC

## 2019-11-26 NOTE — Telephone Encounter (Signed)
-----   Message from [name], MD sent at 11/26/2019  1:19 PM EDT ----- His TSH is abnormal.  The last TSH was slightly lower but free t3/free t4 were OK   Now probably off REcomm:   Cut back on amiodarone to 100 mg daily Add synthroid 12.5 mcg daily Check TSH in 6 weeks

## 2019-11-26 NOTE — Telephone Encounter (Signed)
The patient has been notified of the lab result and verbalized understanding.  All questions (if any) were answered. Frederik Schmidt, RN 11/26/2019 1:40 PM

## 2019-11-30 ENCOUNTER — Telehealth: Payer: Self-pay

## 2019-11-30 NOTE — Telephone Encounter (Signed)
**Note De-Identified Rubina Basinski Obfuscation** I started a Amiodarone 100 mg PA through covermymeds. Key: [id number]

## 2019-12-08 ENCOUNTER — Other Ambulatory Visit: Payer: Self-pay

## 2019-12-08 ENCOUNTER — Encounter: Payer: Self-pay | Admitting: Internal Medicine

## 2019-12-08 ENCOUNTER — Ambulatory Visit (INDEPENDENT_AMBULATORY_CARE_PROVIDER_SITE_OTHER): Payer: Medicare Other | Admitting: Internal Medicine

## 2019-12-08 VITALS — BP 136/80 | HR 70 | Ht 69.0 in | Wt 298.0 lb

## 2019-12-08 DIAGNOSIS — I25119 Atherosclerotic heart disease of native coronary artery with unspecified angina pectoris: Secondary | ICD-10-CM | POA: Diagnosis not present

## 2019-12-08 DIAGNOSIS — Z951 Presence of aortocoronary bypass graft: Secondary | ICD-10-CM

## 2019-12-08 DIAGNOSIS — I10 Essential (primary) hypertension: Secondary | ICD-10-CM

## 2019-12-08 DIAGNOSIS — Z950 Presence of cardiac pacemaker: Secondary | ICD-10-CM | POA: Diagnosis not present

## 2019-12-08 DIAGNOSIS — I441 Atrioventricular block, second degree: Secondary | ICD-10-CM | POA: Diagnosis not present

## 2019-12-08 DIAGNOSIS — I255 Ischemic cardiomyopathy: Secondary | ICD-10-CM | POA: Diagnosis not present

## 2019-12-08 MED ORDER — LEVOTHYROXINE SODIUM 25 MCG PO TABS: 12.5000 ug | ORAL_TABLET | Freq: Every day | ORAL | 3 refills | Status: DC

## 2019-12-08 NOTE — Patient Instructions (Addendum)
Medication Instructions:  Your physician recommends that you continue on your current medications as directed. Please refer to the Current Medication list given to you today.  Labwork: None ordered.  Testing/Procedures: None ordered.  Follow-Up: Your physician wants you to follow-up in: one year with Dr. Lovena Le.   You will receive a reminder letter in the mail two months in advance. If you don't receive a letter, please call our office to schedule the follow-up appointment.  Remote monitoring is used to monitor your Pacemaker from home. This monitoring reduces the number of office visits required to check your device to one time per year. It allows [place] to keep an eye on the functioning of your device to ensure it is working properly. You are scheduled for a device check from home on 01/12/2020. You may send your transmission at any time that day. If you have a wireless device, the transmission will be sent automatically. After your physician reviews your transmission, you will receive a postcard with your next transmission date.  Any Other Special Instructions Will Be Listed Below (If Applicable).  If you need a refill on your cardiac medications before your next appointment, please call your pharmacy.

## 2019-12-08 NOTE — Progress Notes (Signed)
HPI Levi [name] returns today for followup. He is a morbidly obese 86 yo man with CAD, s/p CABG, HTN, PAF on low dose amiodarone, heart block, s/p PPM insertion. In the interim, he denies chest pain but has had progressive sob, fatigue and weakness. He is ok at rest but feels bad when he tries to do anything. He admits to dietary indiscretion. "I still like to eat." He was started on low dose amiodarone.  No Known Allergies   Current Outpatient Medications  Medication Sig Dispense Refill  . amiodarone (PACERONE) 100 MG tablet Take 1 tablet (100 mg total) by mouth daily. 30 tablet 0  . apixaban (ELIQUIS) 5 MG TABS tablet Take 1 tablet (5 mg total) by mouth 2 (two) times daily. 180 tablet 1  . cetirizine (ZYRTEC) 10 MG tablet Take 10 mg by mouth daily.    Marland Kitchen levothyroxine (SYNTHROID) 25 MCG tablet Take 0.5 tablets (12.5 mcg total) by mouth daily before breakfast. 30 tablet 0  . Melatonin 5 MG TABS Take 5 mg by mouth as needed (sleep).     . nitroGLYCERIN (NITROSTAT) 0.4 MG SL tablet Place 0.4 mg under the tongue every 5 (five) minutes x 3 doses as needed for chest pain.    . pantoprazole (PROTONIX) 40 MG tablet Take 1 tablet (40 mg total) by mouth at bedtime. 30 tablet 11  . polyethylene glycol (MIRALAX / GLYCOLAX) 17 g packet Take 17 g by mouth daily.    . potassium chloride SA (K-DUR) 20 MEQ tablet Take 1 tablet (20 mEq total) by mouth 2 (two) times daily. 180 tablet 3  . pravastatin (PRAVACHOL) 20 MG tablet TAKE 1 TABLET BY MOUTH  DAILY 90 tablet 3  . tamsulosin (FLOMAX) 0.4 MG CAPS capsule Take 0.4 mg by mouth daily.    Marland Kitchen torsemide (DEMADEX) 20 MG tablet Take 2 tabs (40 mg) in the morning and 1 tab (20 mg) in the early afternoon. Every Monday and Thurs take one extra tablet in afternoon. 180 tablet 3   No current facility-administered medications for this visit.     Past Medical History:  Diagnosis Date  . Asthma   . Benign neoplasm of colon   . CAD (coronary artery disease) Dec  2011   s/p CABG per Dr. Roxy Manns; had normal EF; All SVGs occluded per follow up cath with only LIMA to LAD patent; s/p PCI of the proximal and mid LAD February 2014 per Dr. Burt Knack  . Chronic combined systolic and diastolic CHF (congestive heart failure) ([place])   . CKD (chronic kidney disease), stage II   . Emphysema    "never treated for it" (10/05/2015)  . History of hiatal hernia   . HLD (hyperlipidemia)   . HTN (hypertension)   . Malaise and fatigue   . Obesity   . Osteopenia   . PAF (paroxysmal atrial fibrillation) ([place])   . Pneumonia 1960s X 1; 1990s X 1  . Presence of permanent cardiac pacemaker   . Prostate cancer ([place])   . Skin cancer    "face, nose, top of ears, scalp"  . Symptomatic bradycardia    STJ PPM, 10/05/15, Dr. Lovena Le    ROS:   All systems reviewed and negative except as noted in the HPI.   Past Surgical History:  Procedure Laterality Date  . ANTERIOR CERVICAL DECOMP/DISCECTOMY FUSION    . BACK SURGERY    . C-EYE SURGERY PROCEDURE    . CATARACT EXTRACTION Right   . CORONARY  ANGIOPLASTY WITH STENT PLACEMENT  10/14/12   with stent to proximal and mid LAD  . CORONARY ARTERY BYPASS GRAFT  08/23/10   "CABG X4"  . EP IMPLANTABLE DEVICE N/A 10/05/2015   Procedure: Pacemaker Implant;  Surgeon: [name], MD;  Location: [facility];  Service: Cardiovascular;  Laterality: N/A;  . EP IMPLANTABLE DEVICE N/A 10/06/2015   Procedure: Lead Revision/Repair;  Surgeon: [name], MD;  Location: [facility];  Service: Cardiovascular;  Laterality: N/A;  . EYE SURGERY    . INSERT / REPLACE / REMOVE PACEMAKER  10/05/2015  . LEFT HEART CATHETERIZATION WITH CORONARY/GRAFT ANGIOGRAM N/A 09/01/2012   Procedure: LEFT HEART CATHETERIZATION WITH [name];  Surgeon: [name], MD;  Location: [facility] CATH LAB;  Service: Cardiovascular;  Laterality: N/A;  . MEDIAN STERNOTOMY  08/23/10  . MOHS SURGERY  "@ least twice"  . PACEMAKER INSERTION     STJ  10/05/15  . PERCUTANEOUS CORONARY STENT INTERVENTION (PCI-S) N/A 10/14/2012   Procedure: PERCUTANEOUS CORONARY STENT INTERVENTION (PCI-S);  Surgeon: [name], MD;  Location[facility] CATH LAB;  Service: Cardiovascular;  Laterality: N/A;  . PROSTATE BIOPSY    . TRANSURETHRAL RESECTION OF PROSTATE       Family History  Problem Relation Age of Onset  . Kidney cancer Father   . Heart attack Father   . Hypertension Mother   . Stroke Mother   . Heart disease Brother   . Skin cancer Brother   . Prostate cancer Brother   . Allergic rhinitis Neg Hx   . Asthma Neg Hx   . Eczema Neg Hx   . Urticaria Neg Hx      Social History   Socioeconomic History  . Marital status: Widowed    Spouse name: Not on file  . Number of children: Not on file  . Years of education: Not on file  . Highest education level: Not on file  Occupational History  . Occupation: retired Information systems manager: RETIRED  Tobacco Use  . Smoking status: Former Smoker    Packs/day: 1.00    Years: 30.00    Pack years: 30.00    Types: Cigarettes    Quit date: 09/07/1975    Years since quitting: 44.2  . Smokeless tobacco: Former Systems developer    Types: [place] date: 07/27/2015  Substance and Sexual Activity  . Alcohol use: No    Alcohol/week: 0.0 standard drinks  . Drug use: No  . Sexual activity: Not Currently  Other Topics Concern  . Not on file  Social History Narrative  . Not on file   Social Determinants of Health   Financial Resource Strain:   . Difficulty of Paying Living Expenses:   Food Insecurity:   . Worried About Charity fundraiser in the Last Year:   . Arboriculturist in the Last Year:   Transportation Needs:   . Film/video editor (Medical):   Marland Kitchen Lack of Transportation (Non-Medical):   Physical Activity:   . Days of Exercise per Week:   . Minutes of Exercise per Session:   Stress:   . Feeling of Stress :   Social Connections:   . Frequency of Communication with Friends and Family:   .  Frequency of Social Gatherings with Friends and Family:   . Attends Religious Services:   . Active Member of Clubs or Organizations:   . Attends Archivist Meetings:   .  Marital Status:   Intimate Partner Violence:   . Fear of Current or Ex-Partner:   . Emotionally Abused:   Marland Kitchen Physically Abused:   . Sexually Abused:      BP 136/80   Pulse 70   Ht 5\' 9"  (1.753 m)   Wt 298 lb (135.2 kg)   SpO2 97%   BMI 44.01 kg/m   Physical Exam:  Well appearing NAD HEENT: Unremarkable Neck:  No JVD, no thyromegally Lymphatics:  No adenopathy Back:  No CVA tenderness Lungs:  Clear with no wheezes HEART:  Regular rate rhythm, no murmurs, no rubs, no clicks Abd:  soft, positive bowel sounds, no organomegally, no rebound, no guarding Ext:  2 plus pulses, no edema, no cyanosis, no clubbing Skin:  No rashes no nodules Neuro:  CN II through XII intact, motor grossly intact  EKG - nsr with pacing induced LBBB  DEVICE  Normal device function.  See PaceArt for details.   Assess/Plan: 1. CHB - he has no escape today. He is pacing almost all the time. 2. PPM - his St. Jude DDD PM is working normally. We will recheck in several months. 3. Class 3 systolic/diastolic heart failure - his symptoms are fairly severe. He gets sob walking across the room. His EF is around 40%. He will continue torsemide. He is encouraged to reduce his salt intake. I checked his oxygen saturation with walking and he did not desaturate walking. 4. Obesity - he weighs 300 lbs. I encouraged him to lose weight. This is playing a role in his symptoms.

## 2019-12-10 LAB — CUP PACEART INCLINIC DEVICE CHECK
Battery Remaining Longevity: 103 mo
Battery Voltage: 2.98 V
Brady Statistic RA Percent Paced: 58 %
Brady Statistic RV Percent Paced: 99.9 %
Date Time Interrogation Session: 20210407111252
Implantable Lead Implant Date: 20170202
Implantable Lead Implant Date: 20170202
Implantable Lead Location: 753859
Implantable Lead Location: 753860
Implantable Pulse Generator Implant Date: 20170202
Lead Channel Impedance Value: 400 Ohm
Lead Channel Impedance Value: 425 Ohm
Lead Channel Pacing Threshold Amplitude: 0.875 V
Lead Channel Pacing Threshold Amplitude: 1.25 V
Lead Channel Pacing Threshold Pulse Width: 0.4 ms
Lead Channel Pacing Threshold Pulse Width: 0.4 ms
Lead Channel Sensing Intrinsic Amplitude: 0.6 mV
Lead Channel Sensing Intrinsic Amplitude: 8.8 mV
Lead Channel Setting Pacing Amplitude: 1.125
Lead Channel Setting Pacing Amplitude: 2.5 V
Lead Channel Setting Pacing Pulse Width: 0.4 ms
Lead Channel Setting Sensing Sensitivity: 2 mV
Pulse Gen Model: 2240
Pulse Gen Serial Number: 7864186

## 2019-12-18 ENCOUNTER — Other Ambulatory Visit: Payer: Self-pay | Admitting: Physician Assistant

## 2019-12-31 ENCOUNTER — Other Ambulatory Visit: Payer: Self-pay | Admitting: Internal Medicine

## 2020-01-10 ENCOUNTER — Other Ambulatory Visit: Payer: Self-pay

## 2020-01-10 ENCOUNTER — Other Ambulatory Visit: Payer: Medicare Other | Admitting: *Deleted

## 2020-01-10 DIAGNOSIS — E039 Hypothyroidism, unspecified: Secondary | ICD-10-CM | POA: Diagnosis not present

## 2020-01-10 LAB — TSH: TSH: 11.2 u[IU]/mL — ABNORMAL HIGH (ref 0.450–4.500)

## 2020-01-11 DIAGNOSIS — E039 Hypothyroidism, unspecified: Secondary | ICD-10-CM

## 2020-01-11 DIAGNOSIS — Z79899 Other long term (current) drug therapy: Secondary | ICD-10-CM

## 2020-01-12 ENCOUNTER — Telehealth: Payer: Self-pay

## 2020-01-12 ENCOUNTER — Ambulatory Visit (INDEPENDENT_AMBULATORY_CARE_PROVIDER_SITE_OTHER): Payer: Medicare Other | Admitting: *Deleted

## 2020-01-12 DIAGNOSIS — I441 Atrioventricular block, second degree: Secondary | ICD-10-CM

## 2020-01-12 DIAGNOSIS — I255 Ischemic cardiomyopathy: Secondary | ICD-10-CM

## 2020-01-12 MED ORDER — LEVOTHYROXINE SODIUM 25 MCG PO TABS: 25.0000 ug | ORAL_TABLET | Freq: Every day | ORAL | 2 refills | Status: DC

## 2020-01-12 NOTE — Telephone Encounter (Signed)
Thyroid function is off Looks like he is on 1/2 of a 25 mg    I would increase to 1 tab  Follow up TSH in 3 months  Written by [name], MD on 01/11/2020 2:30 PM EDT   Notes copied from lab results.

## 2020-01-12 NOTE — Telephone Encounter (Signed)
Left message for patient to remind of missed remote transmission.  

## 2020-01-14 LAB — CUP PACEART REMOTE DEVICE CHECK
Battery Remaining Longevity: 101 mo
Battery Remaining Percentage: 95.5 %
Battery Voltage: 2.98 V
Brady Statistic AP VP Percent: 97 %
Brady Statistic AP VS Percent: 1 %
Brady Statistic AS VP Percent: 2.8 %
Brady Statistic AS VS Percent: 1 %
Brady Statistic RA Percent Paced: 86 %
Brady Statistic RV Percent Paced: 99 %
Date Time Interrogation Session: 20210513091659
Implantable Lead Implant Date: 20170202
Implantable Lead Implant Date: 20170202
Implantable Lead Location: 753859
Implantable Lead Location: 753860
Implantable Pulse Generator Implant Date: 20170202
Lead Channel Impedance Value: 400 Ohm
Lead Channel Impedance Value: 400 Ohm
Lead Channel Pacing Threshold Amplitude: 1 V
Lead Channel Pacing Threshold Amplitude: 1.25 V
Lead Channel Pacing Threshold Pulse Width: 0.4 ms
Lead Channel Pacing Threshold Pulse Width: 0.4 ms
Lead Channel Sensing Intrinsic Amplitude: 0.5 mV
Lead Channel Sensing Intrinsic Amplitude: 8.8 mV
Lead Channel Setting Pacing Amplitude: 1.25 V
Lead Channel Setting Pacing Amplitude: 2.5 V
Lead Channel Setting Pacing Pulse Width: 0.4 ms
Lead Channel Setting Sensing Sensitivity: 2 mV
Pulse Gen Model: 2240
Pulse Gen Serial Number: 7864186

## 2020-01-14 NOTE — Progress Notes (Signed)
Remote pacemaker transmission.   

## 2020-02-08 DIAGNOSIS — N183 Chronic kidney disease, stage 3 unspecified: Secondary | ICD-10-CM | POA: Diagnosis not present

## 2020-02-08 DIAGNOSIS — E039 Hypothyroidism, unspecified: Secondary | ICD-10-CM | POA: Diagnosis not present

## 2020-02-08 DIAGNOSIS — I10 Essential (primary) hypertension: Secondary | ICD-10-CM | POA: Diagnosis not present

## 2020-02-08 DIAGNOSIS — J439 Emphysema, unspecified: Secondary | ICD-10-CM | POA: Diagnosis not present

## 2020-02-08 DIAGNOSIS — I480 Paroxysmal atrial fibrillation: Secondary | ICD-10-CM | POA: Diagnosis not present

## 2020-02-08 DIAGNOSIS — I509 Heart failure, unspecified: Secondary | ICD-10-CM | POA: Diagnosis not present

## 2020-02-08 DIAGNOSIS — C61 Malignant neoplasm of prostate: Secondary | ICD-10-CM | POA: Diagnosis not present

## 2020-02-08 DIAGNOSIS — E785 Hyperlipidemia, unspecified: Secondary | ICD-10-CM | POA: Diagnosis not present

## 2020-02-15 ENCOUNTER — Telehealth: Payer: Self-pay | Admitting: Internal Medicine

## 2020-02-15 NOTE — Telephone Encounter (Signed)
It will be fine to check at his appointment w Nicki Reaper.  Thank you.

## 2020-02-15 NOTE — Telephone Encounter (Signed)
Can TSH be checked on the same day patient comes in to see Nicki Reaper? If so, I can call patient, just didn't know if TSH had to be checked on the day it is scheduled.

## 2020-02-15 NOTE — Telephone Encounter (Signed)
Patient's daugher, [name], wants to know if the lab work that is scheduled for 04/11/20 can be rescheduled for the day of his appt on 03/15/20 with Richardson Dopp.

## 2020-02-15 NOTE — Telephone Encounter (Signed)
I called and spoke with patients daughter [name], she is aware that TSH will be checked on 03/15/20 at office visit with Richardson Dopp and patient does not have to come back on 04/11/20.

## 2020-03-01 NOTE — Telephone Encounter (Signed)
Disregard opened in error °

## 2020-03-09 ENCOUNTER — Telehealth: Payer: Self-pay | Admitting: Internal Medicine

## 2020-03-09 NOTE — Telephone Encounter (Signed)
Pt c/o swelling: STAT is pt has developed SOB within 24 hours  1) How much weight have you gained and in what time span? Not sure   2) If swelling, where is the swelling located? Legs & Ankles   3) Are you currently taking a fluid pill? Yes   4) Are you currently SOB? Yes (has any ways, but has been getting worse)  5) Do you have a log of your daily weights (if so, list)? Levi [name] has it, but she is not with him at this time   6) Have you gained 3 pounds in a day or 5 pounds in a week? Yes   7) Have you traveled recently? No   Levi [name] the patient's daughter is calling wanting to know if Levi [name]'s medication should be increased due to this or if he needs to come in to be seen sooner. Patient's weight has gotten up to 304 lbs due to swelling. Levi [name] was not with the patient in order to transfer call to triage for STAT. Please advise.

## 2020-03-09 NOTE — Telephone Encounter (Signed)
I spoke with patient's daughter. She is not currently with patient. She reports patient weighs daily. Today's weight is 304 lbs.  Baseline is 294 lbs.  Daughter does not have daily weights but reports weight has been gradually creeping up for last couple of weeks. She is not sure how much swelling patient is having. Patient is always short of breath but it has been getting worse with weight gain Daughter does not think patient has changed diet recently or increased salt intake Is on torsemide but daughter does not know if he is taking as prescribed. Daughter will call patient to check on amount of swelling and see if he has been taking torsemide.  She will call back with this information.

## 2020-03-09 NOTE — Telephone Encounter (Signed)
Patient's daughter called back. She reports swelling is in feet and legs. Legs are swollen all day. Patient ate BBQ twice over the 4th of July.    Daughter states patient will sometimes skip morning dose of torsemide if he is going out in the morning. Currently he is taking torsemide 40 mg twice daily.  She does not know how long he has been taking this dose. Was previously on metolazone earlier this year but this was stopped due to weakness.  Daughter states patient still has a few tablets at home and did take one metolazone today. His thyroid medication is also being adjusted. Last increased by Dr Harrington Challenger based on May 10 lab results.  Patient saw PCP on 6/8 and TSH was 13.4.  PCP recommended to increase levothyroxine dose. As last dose change was made by cardiology patient and his daughter wanted to wait until patient saw Richardson Dopp, [place] on July 14 before making any changes to prevent confusion to patient. Will forward to Dr Harrington Challenger for review/recommendations.

## 2020-03-10 ENCOUNTER — Other Ambulatory Visit: Payer: Self-pay | Admitting: Internal Medicine

## 2020-03-10 ENCOUNTER — Telehealth: Payer: Self-pay | Admitting: Internal Medicine

## 2020-03-10 MED ORDER — TORSEMIDE 20 MG PO TABS
40.0000 mg | ORAL_TABLET | Freq: Two times a day (BID) | ORAL | 0 refills | Status: DC
Start: 2020-03-10 — End: 2020-03-15

## 2020-03-10 NOTE — Telephone Encounter (Signed)
He took metolazone today    I would continue torsemide 2x per day   Watch salt Keep appt with Kathleen Argue

## 2020-03-10 NOTE — Telephone Encounter (Signed)
I placed call to patient's daughter and left message to call office.  

## 2020-03-10 NOTE — Telephone Encounter (Signed)
I spoke with patient's daughter and gave her information from Dr Harrington Challenger. Patient will continue torsemide 40 mg twice daily for now.   Will update med list but not send in new prescription at this time.  I told daughter new prescription could be sent in after patient sees Richardson Dopp, [place] next week if this dose is continued.   Daughter reports patient's weight is down 8 lbs.

## 2020-03-10 NOTE — Telephone Encounter (Signed)
Patient's daughter, [name], returning Patricia's call. Transferred call to [employer].

## 2020-03-13 NOTE — Telephone Encounter (Signed)
Prescription refill request for Eliquis received. Indication: Atrial Fibrillation Last office visit: 12/08/2019 Dr Lovena Le Scr: 1.42 11/03/2019 Age: 86 Weight: 135.2 kg  Prescription refill sent

## 2020-03-14 ENCOUNTER — Other Ambulatory Visit: Payer: Self-pay

## 2020-03-14 ENCOUNTER — Emergency Department (HOSPITAL_COMMUNITY): Payer: Medicare Other

## 2020-03-14 ENCOUNTER — Encounter (HOSPITAL_COMMUNITY): Payer: Self-pay

## 2020-03-14 ENCOUNTER — Telehealth: Payer: Self-pay | Admitting: Internal Medicine

## 2020-03-14 ENCOUNTER — Observation Stay (HOSPITAL_COMMUNITY)
Admission: EM | Admit: 2020-03-14 | Discharge: 2020-03-15 | Disposition: A | Discharge status: Home / Self Care | Payer: Medicare Other | Attending: Internal Medicine | Admitting: Internal Medicine

## 2020-03-14 DIAGNOSIS — I2510 Atherosclerotic heart disease of native coronary artery without angina pectoris: Secondary | ICD-10-CM | POA: Diagnosis not present

## 2020-03-14 DIAGNOSIS — Z6841 Body Mass Index (BMI) 40.0 and over, adult: Secondary | ICD-10-CM | POA: Insufficient documentation

## 2020-03-14 DIAGNOSIS — I130 Hypertensive heart and chronic kidney disease with heart failure and stage 1 through stage 4 chronic kidney disease, or unspecified chronic kidney disease: Secondary | ICD-10-CM | POA: Insufficient documentation

## 2020-03-14 DIAGNOSIS — R55 Syncope and collapse: Secondary | ICD-10-CM | POA: Insufficient documentation

## 2020-03-14 DIAGNOSIS — G4733 Obstructive sleep apnea (adult) (pediatric): Secondary | ICD-10-CM | POA: Insufficient documentation

## 2020-03-14 DIAGNOSIS — N189 Chronic kidney disease, unspecified: Secondary | ICD-10-CM | POA: Diagnosis not present

## 2020-03-14 DIAGNOSIS — E785 Hyperlipidemia, unspecified: Secondary | ICD-10-CM | POA: Diagnosis present

## 2020-03-14 DIAGNOSIS — R42 Dizziness and giddiness: Secondary | ICD-10-CM | POA: Diagnosis present

## 2020-03-14 DIAGNOSIS — I480 Paroxysmal atrial fibrillation: Secondary | ICD-10-CM | POA: Diagnosis present

## 2020-03-14 DIAGNOSIS — R531 Weakness: Secondary | ICD-10-CM

## 2020-03-14 DIAGNOSIS — I5042 Chronic combined systolic (congestive) and diastolic (congestive) heart failure: Secondary | ICD-10-CM | POA: Insufficient documentation

## 2020-03-14 DIAGNOSIS — J438 Other emphysema: Secondary | ICD-10-CM | POA: Diagnosis present

## 2020-03-14 DIAGNOSIS — E876 Hypokalemia: Secondary | ICD-10-CM | POA: Diagnosis not present

## 2020-03-14 DIAGNOSIS — W19XXXA Unspecified fall, initial encounter: Secondary | ICD-10-CM | POA: Diagnosis not present

## 2020-03-14 DIAGNOSIS — Z8546 Personal history of malignant neoplasm of prostate: Secondary | ICD-10-CM | POA: Diagnosis not present

## 2020-03-14 DIAGNOSIS — Z87891 Personal history of nicotine dependence: Secondary | ICD-10-CM | POA: Diagnosis not present

## 2020-03-14 DIAGNOSIS — N179 Acute kidney failure, unspecified: Secondary | ICD-10-CM | POA: Diagnosis not present

## 2020-03-14 DIAGNOSIS — I517 Cardiomegaly: Secondary | ICD-10-CM | POA: Diagnosis not present

## 2020-03-14 DIAGNOSIS — Z79899 Other long term (current) drug therapy: Secondary | ICD-10-CM | POA: Diagnosis not present

## 2020-03-14 DIAGNOSIS — I10 Essential (primary) hypertension: Secondary | ICD-10-CM | POA: Diagnosis not present

## 2020-03-14 DIAGNOSIS — I5030 Unspecified diastolic (congestive) heart failure: Secondary | ICD-10-CM | POA: Diagnosis present

## 2020-03-14 DIAGNOSIS — R0602 Shortness of breath: Secondary | ICD-10-CM | POA: Diagnosis not present

## 2020-03-14 DIAGNOSIS — E871 Hypo-osmolality and hyponatremia: Secondary | ICD-10-CM | POA: Insufficient documentation

## 2020-03-14 DIAGNOSIS — N1832 Chronic kidney disease, stage 3b: Secondary | ICD-10-CM | POA: Diagnosis not present

## 2020-03-14 DIAGNOSIS — Z7901 Long term (current) use of anticoagulants: Secondary | ICD-10-CM

## 2020-03-14 DIAGNOSIS — J439 Emphysema, unspecified: Secondary | ICD-10-CM | POA: Insufficient documentation

## 2020-03-14 DIAGNOSIS — Z20822 Contact with and (suspected) exposure to covid-19: Secondary | ICD-10-CM | POA: Insufficient documentation

## 2020-03-14 DIAGNOSIS — Z951 Presence of aortocoronary bypass graft: Secondary | ICD-10-CM | POA: Diagnosis not present

## 2020-03-14 DIAGNOSIS — Z950 Presence of cardiac pacemaker: Secondary | ICD-10-CM | POA: Diagnosis not present

## 2020-03-14 DIAGNOSIS — R5381 Other malaise: Secondary | ICD-10-CM | POA: Diagnosis not present

## 2020-03-14 LAB — BASIC METABOLIC PANEL
Anion gap: 19 — ABNORMAL HIGH (ref 5–15)
BUN: 42 mg/dL — ABNORMAL HIGH (ref 8–23)
CO2: 31 mmol/L (ref 22–32)
Calcium: 9.5 mg/dL (ref 8.9–10.3)
Chloride: 83 mmol/L — ABNORMAL LOW (ref 98–111)
Creatinine, Ser: 1.98 mg/dL — ABNORMAL HIGH (ref 0.61–1.24)
GFR calc Af Amer: 34 mL/min — ABNORMAL LOW (ref >60)
GFR calc non Af Amer: 29 mL/min — ABNORMAL LOW (ref >60)
Glucose, Bld: 104 mg/dL — ABNORMAL HIGH (ref 70–99)
Potassium: 2.5 mmol/L — CL (ref 3.5–5.1)
Sodium: 133 mmol/L — ABNORMAL LOW (ref 135–145)

## 2020-03-14 LAB — URINALYSIS, ROUTINE W REFLEX MICROSCOPIC
Bilirubin Urine: NEGATIVE
Glucose, UA: NEGATIVE mg/dL
Hgb urine dipstick: NEGATIVE
Ketones, ur: NEGATIVE mg/dL
Leukocytes,Ua: NEGATIVE
Nitrite: NEGATIVE
Protein, ur: NEGATIVE mg/dL
Specific Gravity, Urine: 1.01 (ref 1.005–1.030)
pH: 5 (ref 5.0–8.0)

## 2020-03-14 LAB — CBC
HCT: 44.4 % (ref 39.0–52.0)
Hemoglobin: 14.5 g/dL (ref 13.0–17.0)
MCH: 30.6 pg (ref 26.0–34.0)
MCHC: 32.7 g/dL (ref 30.0–36.0)
MCV: 93.7 fL (ref 80.0–100.0)
Platelets: 262 10*3/uL (ref 150–400)
RBC: 4.74 MIL/uL (ref 4.22–5.81)
RDW: 13.5 % (ref 11.5–15.5)
WBC: 11.5 10*3/uL — ABNORMAL HIGH (ref 4.0–10.5)
nRBC: 0 % (ref 0.0–0.2)

## 2020-03-14 LAB — SARS CORONAVIRUS 2 BY RT PCR (HOSPITAL ORDER, PERFORMED IN ~~LOC~~ HOSPITAL LAB): SARS Coronavirus 2: NEGATIVE

## 2020-03-14 LAB — MAGNESIUM: Magnesium: 2.2 mg/dL (ref 1.7–2.4)

## 2020-03-14 LAB — BRAIN NATRIURETIC PEPTIDE: B Natriuretic Peptide: 57.7 pg/mL (ref 0.0–100.0)

## 2020-03-14 MED ORDER — TAMSULOSIN HCL 0.4 MG PO CAPS: 0.4000 mg | ORAL_CAPSULE | Freq: Every day | ORAL | Status: DC

## 2020-03-14 MED ORDER — ONDANSETRON HCL 4 MG PO TABS: 4.0000 mg | ORAL_TABLET | Freq: Four times a day (QID) | ORAL | Status: DC | PRN

## 2020-03-14 MED ORDER — ACETAMINOPHEN 650 MG RE SUPP: 650.0000 mg | Freq: Four times a day (QID) | RECTAL | Status: DC | PRN

## 2020-03-14 MED ORDER — SODIUM CHLORIDE 0.9% FLUSH
3.0000 mL | Freq: Once | INTRAVENOUS | Status: AC
  Administered 2020-03-15: 3 mL via INTRAVENOUS

## 2020-03-14 MED ORDER — SODIUM CHLORIDE 0.9% FLUSH: 3.0000 mL | Freq: Two times a day (BID) | INTRAVENOUS | Status: DC

## 2020-03-14 MED ORDER — POTASSIUM CHLORIDE 10 MEQ/100ML IV SOLN
10.0000 meq | INTRAVENOUS | Status: AC
  Administered 2020-03-14 – 2020-03-15 (×3): 10 meq via INTRAVENOUS
  Filled 2020-03-14 (×3): qty 100

## 2020-03-14 MED ORDER — PRAVASTATIN SODIUM 10 MG PO TABS
20.0000 mg | ORAL_TABLET | Freq: Every day | ORAL | Status: DC
  Administered 2020-03-15: 20 mg via ORAL
  Filled 2020-03-14: qty 2

## 2020-03-14 MED ORDER — ACETAMINOPHEN 325 MG PO TABS: 650.0000 mg | ORAL_TABLET | Freq: Four times a day (QID) | ORAL | Status: DC | PRN

## 2020-03-14 MED ORDER — APIXABAN 5 MG PO TABS
5.0000 mg | ORAL_TABLET | Freq: Two times a day (BID) | ORAL | Status: DC
  Administered 2020-03-15 (×2): 5 mg via ORAL
  Filled 2020-03-14 (×5): qty 1

## 2020-03-14 MED ORDER — POTASSIUM CHLORIDE IN NACL 40-0.9 MEQ/L-% IV SOLN
INTRAVENOUS | Status: AC
  Filled 2020-03-14 (×2): qty 1000

## 2020-03-14 MED ORDER — POTASSIUM CHLORIDE 20 MEQ/15ML (10%) PO SOLN
60.0000 meq | Freq: Once | ORAL | Status: AC
  Administered 2020-03-15: 60 meq via ORAL
  Filled 2020-03-14: qty 45

## 2020-03-14 MED ORDER — SENNOSIDES-DOCUSATE SODIUM 8.6-50 MG PO TABS: 1.0000 | ORAL_TABLET | Freq: Every evening | ORAL | Status: DC | PRN

## 2020-03-14 MED ORDER — MELATONIN 5 MG PO TABS
5.0000 mg | ORAL_TABLET | Freq: Every evening | ORAL | Status: DC | PRN
  Filled 2020-03-14: qty 1

## 2020-03-14 MED ORDER — AMIODARONE HCL 200 MG PO TABS
100.0000 mg | ORAL_TABLET | Freq: Every day | ORAL | Status: DC
  Administered 2020-03-15: 100 mg via ORAL
  Filled 2020-03-14: qty 1

## 2020-03-14 MED ORDER — ONDANSETRON HCL 4 MG/2ML IJ SOLN: 4.0000 mg | Freq: Four times a day (QID) | INTRAMUSCULAR | Status: DC | PRN

## 2020-03-14 MED ORDER — LEVOTHYROXINE SODIUM 50 MCG PO TABS
50.0000 ug | ORAL_TABLET | Freq: Every day | ORAL | Status: DC
  Administered 2020-03-15: 50 ug via ORAL
  Filled 2020-03-14: qty 1

## 2020-03-14 MED ORDER — ALBUTEROL SULFATE (2.5 MG/3ML) 0.083% IN NEBU: 2.5000 mg | INHALATION_SOLUTION | RESPIRATORY_TRACT | Status: DC | PRN

## 2020-03-14 MED ORDER — PANTOPRAZOLE SODIUM 40 MG PO TBEC
40.0000 mg | DELAYED_RELEASE_TABLET | Freq: Every day | ORAL | Status: DC
  Administered 2020-03-15: 40 mg via ORAL
  Filled 2020-03-14: qty 1

## 2020-03-14 NOTE — ED Notes (Signed)
Admitting MD at bedside.

## 2020-03-14 NOTE — ED Triage Notes (Signed)
Pt presents to ED via EMS with increased weakness and SOB. Hx Afib, CHF with pacemaker. Pt had fall today related to weakness- denies LOC. Pt on eliquis. Denies hitting head.  BP sitting 140/90, standing 76/40. Denies CP. Pt on o2 for comfort, does not normally wear o2 at home with 97% room air sat.   20 LAC

## 2020-03-14 NOTE — ED Provider Notes (Signed)
[facility] EMERGENCY DEPARTMENT Provider Note   CSN: [id number] Arrival date & time: 03/14/20  1707     History Chief Complaint  Patient presents with   Fall   Weakness   Shortness of Breath    Levi [name] is a 86 y.o. male.  HPI   Patient presents to the emergency department with chief complaint of generalized weakness and dizziness.  Patient states today he fell outside on the lawn landing on his chest , he is unsure if he tripped or lost his balance.  He denies hitting his head, losing consciousness, remembers all events before and after but admits to being on blood thinners. Patient denies being in any pain, able to ambulate without difficulty, no difficulty moving his upper extremities, denies headache, vision loss, paresthesias or chest pain.  He does admit that he has felt dizzy and felt more short of breath.  She describes the dizziness as the  room is spinning and he feels unsteady on his feet.  Patient has medical history of A. fib, congestive heart failure, and recently had a pacemaker placed.  He is on torsemide and feels like the medicine has dried him out.  Patient denies headache, fever, change in vision, nasal congestion, sore throat, cough, chest pain, nausea, vomiting, abdominal pain, dysuria, diarrhea.  Past Medical History:  Diagnosis Date   Asthma    Benign neoplasm of colon    CAD (coronary artery disease) Dec 2011   s/p CABG per Dr. Roxy Manns; had normal EF; All SVGs occluded per follow up cath with only LIMA to LAD patent; s/p PCI of the proximal and mid LAD February 2014 per Dr. Burt Knack   Chronic combined systolic and diastolic CHF (congestive heart failure) ([place])    CKD (chronic kidney disease), stage II    Emphysema    "never treated for it" (10/05/2015)   History of hiatal hernia    HLD (hyperlipidemia)    HTN (hypertension)    Malaise and fatigue    Obesity    Osteopenia    PAF (paroxysmal atrial fibrillation)  ([place])    Pneumonia 1960s X 1; 1990s X 1   Presence of permanent cardiac pacemaker    Prostate cancer ([place])    Skin cancer    "face, nose, top of ears, scalp"   Symptomatic bradycardia    STJ PPM, 10/05/15, Dr. Lovena Le    Patient Active Problem List   Diagnosis Date Noted   Acute kidney injury superimposed on CKD ([place]) 03/14/2020   Hypokalemia 03/14/2020   Dizziness 03/17/2019   Acute on chronic combined systolic (congestive) and diastolic (congestive) heart failure ([place]) 03/15/2019   CKD (chronic kidney disease) stage 2, GFR 60-89 ml/min 10/09/2018   Ischemic cardiomyopathy 10/06/2018   Presence of permanent cardiac pacemaker 06/08/2018   Orthostatic hypotension 08/14/2017   Chronic combined systolic and diastolic CHF (congestive heart failure) ([place]) 06/09/2017   Obesity (BMI 30-39.9) 02/19/2017   OSA (obstructive sleep apnea) 02/19/2017   Decreased hearing of both ears 12/24/2016   Depression, reactive 12/06/2016   Mobitz type 2 second degree atrioventricular block 10/05/2015   Chronic anticoagulation 09/19/2015   Bifascicular block 09/19/2015   Bradycardia 09/19/2015   Syncope 06/30/2014   Lumbosacral spondylosis 02/21/2014   Personal history of nicotine dependence 10/29/2013   Personal history of tobacco use, presenting hazards to health 10/29/2013   H/O coronary artery bypass surgery 10/22/2012   Angina, class III ([place]) 10/15/2012   CA of prostate ([place]) 07/09/2012  Gastro-esophageal reflux disease with esophagitis 07/09/2012   Allergic rhinitis 06/15/2012   Back pain 10/24/2011   CAD (coronary artery disease) 06/05/2011   Chronic coronary artery disease 06/05/2011   PAF (paroxysmal atrial fibrillation) ([place]) 09/20/2010   PROSTATE CANCER 12/27/2009   ADENOMATOUS COLONIC POLYP 12/27/2009   HLD (hyperlipidemia) 12/27/2009   Morbid obesity ([place]) 12/27/2009   Essential hypertension 12/27/2009   EMPHYSEMA 12/27/2009   OSTEOPENIA  12/27/2009   MALAISE AND FATIGUE 12/27/2009   Heart failure with preserved ejection fraction ([place]) 12/27/2009   CHEST PAIN UNSPECIFIED 12/27/2009   History of colonic polyps 12/04/2009    Past Surgical History:  Procedure Laterality Date   ANTERIOR CERVICAL DECOMP/DISCECTOMY FUSION     BACK SURGERY     C-EYE SURGERY PROCEDURE     CATARACT EXTRACTION Right    CORONARY ANGIOPLASTY WITH STENT PLACEMENT  10/14/12   with stent to proximal and mid LAD   CORONARY ARTERY BYPASS GRAFT  08/23/10   "CABG X4"   EP IMPLANTABLE DEVICE N/A 10/05/2015   Procedure: Pacemaker Implant;  Surgeon: [name], MD;  Location: [facility];  Service: Cardiovascular;  Laterality: N/A;   EP IMPLANTABLE DEVICE N/A 10/06/2015   Procedure: Lead Revision/Repair;  Surgeon: [name], MD;  Location: [facility];  Service: Cardiovascular;  Laterality: N/A;   EYE SURGERY     INSERT / REPLACE / REMOVE PACEMAKER  10/05/2015   LEFT HEART CATHETERIZATION WITH CORONARY/GRAFT ANGIOGRAM N/A 09/01/2012   Procedure: LEFT HEART CATHETERIZATION WITH [name];  Surgeon: [name], MD;  Location: [facility] CATH LAB;  Service: Cardiovascular;  Laterality: N/A;   MEDIAN STERNOTOMY  08/23/10   MOHS SURGERY  "@ least twice"   PACEMAKER INSERTION     STJ 10/05/15   PERCUTANEOUS CORONARY STENT INTERVENTION (PCI-S) N/A 10/14/2012   Procedure: PERCUTANEOUS CORONARY STENT INTERVENTION (PCI-S);  Surgeon: [name], MD;  Location: [facility] CATH LAB;  Service: Cardiovascular;  Laterality: N/A;   PROSTATE BIOPSY     TRANSURETHRAL RESECTION OF PROSTATE         Family History  Problem Relation Age of Onset   Kidney cancer Father    Heart attack Father    Hypertension Mother    Stroke Mother    Heart disease Brother    Skin cancer Brother    Prostate cancer Brother    Allergic rhinitis Neg Hx    Asthma Neg Hx    Eczema Neg Hx    Urticaria Neg Hx     Social History     Tobacco Use   Smoking status: Former Smoker    Packs/day: 1.00    Years: 30.00    Pack years: 30.00    Types: Cigarettes    Quit date: 09/07/1975    Years since quitting: 44.5   Smokeless tobacco: Former Systems developer    Types: Chew    Quit date: 07/27/2015  Vaping Use   Vaping Use: Never used  Substance Use Topics   Alcohol use: No    Alcohol/week: 0.0 standard drinks   Drug use: No    Home Medications Prior to Admission medications   Medication Sig Start Date End Date Taking? Authorizing Provider  Artificial Tear Ointment (DRY EYES OP) Place 1 drop into both eyes 3 (three) times daily as needed (for dryness).   Yes [provider]  cetirizine (ZYRTEC) 10 MG tablet Take 10 mg by mouth daily as needed for allergies or rhinitis.    Yes [provider]  docusate sodium (COLACE) 100 MG capsule Take 100-200 mg by mouth every other day.   Yes [provider]  ELIQUIS 5 MG TABS tablet TAKE 1 TABLET BY MOUTH  TWICE DAILY Patient taking differently: Take 5 mg by mouth 2 (two) times daily.  03/13/20  Yes [name], MD  Melatonin 5 MG TABS Take 5 mg by mouth at bedtime as needed (sleep).  08/07/18  Yes [provider]  nitroGLYCERIN (NITROSTAT) 0.4 MG SL tablet Place 0.4 mg under the tongue every 5 (five) minutes x 3 doses as needed for chest pain. 08/09/13  Yes [name], MD  pantoprazole (PROTONIX) 40 MG tablet Take 1 tablet (40 mg total) by mouth at bedtime. 09/20/19  Yes [name], MD  potassium chloride SA (KLOR-CON) 20 MEQ tablet Take 1 tablet (20 mEq total) by mouth 2 (two) times daily. Pt must keep upcoming appt with provider for further refills Patient taking differently: Take 20 mEq by mouth in the morning and at bedtime.  03/13/20  Yes [name], MD  pravastatin (PRAVACHOL) 20 MG tablet TAKE 1 TABLET BY MOUTH  DAILY Patient taking differently: Take 20 mg by mouth at bedtime.  12/21/19  Yes [name], MD  tamsulosin (FLOMAX) 0.4 MG  CAPS capsule Take 0.4 mg by mouth at bedtime.    Yes [provider]  torsemide (DEMADEX) 20 MG tablet Take 2 tablets (40 mg total) by mouth 2 (two) times daily. 03/10/20  Yes [name], MD  amiodarone (PACERONE) 100 MG tablet Take 1 tablet (100 mg total) by mouth daily. Patient not taking: Reported on 03/14/2020 11/26/19   [name], MD  levothyroxine (SYNTHROID) 50 MCG tablet Take 50 mcg by mouth daily before breakfast. 02/11/20   [provider]  polyethylene glycol (MIRALAX / GLYCOLAX) 17 g packet Take 17 g by mouth daily. Patient not taking: Reported on 03/14/2020    [provider]    Allergies    Patient has no known allergies.  Review of Systems   Review of Systems  Constitutional: Negative for chills, diaphoresis and fever.  HENT: Negative for congestion, trouble swallowing and voice change.   Eyes: Negative for photophobia and visual disturbance.  Respiratory: Positive for shortness of breath. Negative for cough.   Cardiovascular: Positive for leg swelling. Negative for chest pain.  Gastrointestinal: Negative for abdominal pain, diarrhea, nausea and vomiting.  Genitourinary: Negative for enuresis, flank pain and frequency.  Musculoskeletal: Negative for back pain and joint swelling.  Skin: Negative for rash.  Neurological: Positive for dizziness, weakness and light-headedness. Negative for headaches.  Hematological: Does not bruise/bleed easily.    Physical Exam Updated Vital Signs BP 132/65    Pulse 71    Temp 97.9 F (36.6 C) (Oral)    Resp 18    Ht 5\' 9"  (1.753 m)    Wt 127.9 kg    SpO2 94%    BMI 41.64 kg/m   Physical Exam Vitals and nursing note reviewed.  Constitutional:      General: He is not in acute distress.    Appearance: Normal appearance. He is not ill-appearing or diaphoretic.  HENT:     Head: Normocephalic and atraumatic.     Nose: No congestion or rhinorrhea.     Mouth/Throat:     Mouth: Mucous membranes are moist.      Pharynx: Oropharynx is clear.  Eyes:     General: No visual field deficit  or scleral icterus.    Extraocular Movements: Extraocular movements intact.     Conjunctiva/sclera: Conjunctivae normal.     Pupils: Pupils are equal, round, and reactive to light.  Cardiovascular:     Rate and Rhythm: Normal rate and regular rhythm.     Pulses: Normal pulses.     Heart sounds: No murmur heard.  No friction rub. No gallop.   Pulmonary:     Effort: Pulmonary effort is normal. No respiratory distress.     Breath sounds: Rales present. No wheezing or rhonchi.     Comments: Patient had bilateral rales in the lower lobes. Abdominal:     General: There is no distension.     Palpations: Abdomen is soft.     Tenderness: There is no abdominal tenderness. There is no guarding.     Hernia: No hernia is present.  Musculoskeletal:        General: No swelling or tenderness.     Cervical back: No rigidity or tenderness.     Right lower leg: Edema present.     Left lower leg: Edema present.     Comments: Patient has 2+ pedal edema up to the shins.  There was no ulcers or wounds noted, no drainage, or discharge.  Patient had good pedal pulses, good capillary refill.  Skin:    General: Skin is warm and dry.     Capillary Refill: Capillary refill takes less than 2 seconds.     Findings: No rash.  Neurological:     General: No focal deficit present.     Mental Status: He is alert and oriented to person, place, and time.     GCS: GCS eye subscore is 4. GCS verbal subscore is 5. GCS motor subscore is 6.     Cranial Nerves: Cranial nerves are intact. No cranial nerve deficit or facial asymmetry.     Sensory: Sensation is intact. No sensory deficit.     Motor: Motor function is intact. No weakness or pronator drift.     Coordination: Coordination is intact. Romberg sign negative. Finger-Nose-Finger Test and Heel to [facility] Test normal.  Psychiatric:        Mood and Affect: Mood normal.     ED Results /  Procedures / Treatments   Labs (all labs ordered are listed, but only abnormal results are displayed) Labs Reviewed  BASIC METABOLIC PANEL - Abnormal; Notable for the following components:      Result Value   Sodium 133 (*)    Potassium 2.5 (*)    Chloride 83 (*)    Glucose, Bld 104 (*)    BUN 42 (*)    Creatinine, Ser 1.98 (*)    GFR calc non Af Amer 29 (*)    GFR calc Af Amer 34 (*)    Anion gap 19 (*)    All other components within normal limits  CBC - Abnormal; Notable for the following components:   WBC 11.5 (*)    All other components within normal limits  SARS CORONAVIRUS 2 BY RT PCR (HOSPITAL ORDER, [facility])  URINALYSIS, ROUTINE W REFLEX MICROSCOPIC  MAGNESIUM  BRAIN NATRIURETIC PEPTIDE  TSH  CBC  BASIC METABOLIC PANEL    EKG EKG Interpretation  Date/Time:  Tuesday March 14 2020 17:10:40 EDT Ventricular Rate:  70 PR Interval:  208 QRS Duration: 178 QT Interval:  520 QTC Calculation: 561 R Axis:   -76 Text Interpretation: AV dual-paced rhythm Abnormal ECG No significant  change since last tracing Confirmed by Deno Etienne [phone]) on 03/14/2020 9:27:41 PM   Radiology DG Chest 2 View  Result Date: 03/14/2020 CLINICAL DATA:  86 year old male with increased weakness and shortness of breath. EXAM: CHEST - 2 VIEW COMPARISON:  Chest radiographs 08/04/2019 and earlier. FINDINGS: Semi upright AP and lateral views. Stable prior CABG and left chest pacemaker. Mild cardiomegaly and mediastinal contours are stable. Calcified aortic atherosclerosis. Visualized tracheal air column is within normal limits. Lower lung volumes. Stable pulmonary vascularity and/or chronic increased interstitial markings without overt edema. No pneumothorax, pleural effusion or consolidation. Stable visualized osseous structures. Negative visible bowel gas pattern. IMPRESSION: Lower lung volumes. No acute cardiopulmonary abnormality. Electronically Signed   By: Genevie Ann M.D.    On: 03/14/2020 21:14    Procedures Procedures (including critical care time)  Medications Ordered in ED Medications  sodium chloride flush (NS) 0.9 % injection 3 mL (has no administration in time range)  potassium chloride 10 mEq in 100 mL IVPB (0 mEq Intravenous Stopped 03/14/20 2249)  sodium chloride flush (NS) 0.9 % injection 3 mL (has no administration in time range)  acetaminophen (TYLENOL) tablet 650 mg (has no administration in time range)    Or  acetaminophen (TYLENOL) suppository 650 mg (has no administration in time range)  ondansetron (ZOFRAN) tablet 4 mg (has no administration in time range)    Or  ondansetron (ZOFRAN) injection 4 mg (has no administration in time range)  senna-docusate (Senokot-S) tablet 1 tablet (has no administration in time range)  potassium chloride 20 MEQ/15ML (10%) solution 60 mEq (has no administration in time range)  0.9 % NaCl with KCl 40 mEq / L  infusion (has no administration in time range)  amiodarone (PACERONE) tablet 100 mg (has no administration in time range)  apixaban (ELIQUIS) tablet 5 mg (has no administration in time range)  levothyroxine (SYNTHROID) tablet 50 mcg (has no administration in time range)  melatonin tablet 5 mg (has no administration in time range)  pantoprazole (PROTONIX) EC tablet 40 mg (has no administration in time range)  pravastatin (PRAVACHOL) tablet 20 mg (has no administration in time range)  albuterol (PROVENTIL) (2.5 MG/3ML) 0.083% nebulizer solution 2.5 mg (has no administration in time range)    ED Course  I have reviewed the triage vital signs and the nursing notes.  Pertinent labs & imaging results that were available during my care of the patient were reviewed by me and considered in my medical decision making (see chart for details).    MDM Rules/Calculators/A&P                          I have personally reviewed all imaging, labs and have interpreted them.  Due to patient's complaint most concern  for CVA versus systemic infection versus metabolic abnormality versus CHF exacerbation.  Unlikely patient suffering from CVA as vital signs are reassuring BP was normotensive, neuro exam was normal with no focal deficits.  Unlikely patient suffering from a systemic infection as patient is nontoxic-appearing, vital signs reassuring, patient has slight leukocytosis 11.5. UA did not show nitrates or leukocytes making UTI or [place] unlikely.  Patient's BMP showed potassium of 2.5, creatinine of 1.98, anion gap of 19 most likely secondary to overdiuresis.    Patient will be placed on a potassium infusion and will hold off on providing fluids due CHF and possible fluid overload. Unlikely patient suffering from CHF exacerbation as x-ray did not show any acute  abnormalities, no fluid, infiltrates, consolidations, widening mediastinum noted, BMP was 56.   Will consult hospitalist for admission due to hypokalemia.  Spoke with Dr. Posey Pronto who stated he will come and see the patient.  Patient will be admitted for potassium replacement and further evaluation.  Patient care will be transferred to hospitalist team.  Final Clinical Impression(s) / ED Diagnoses Final diagnoses:  Hypokalemia  AKI (acute kidney injury) ([place])  Weakness  Fall, initial encounter    Rx / DC Orders ED Discharge Orders    None       Aron Baba 03/14/20 [place], DO 03/14/20 [id number]

## 2020-03-14 NOTE — Telephone Encounter (Signed)
Call sent straight to triage. Patient's daughter stated patient fell and is feeling dizzy, swimmy headed, and SOB. Asked if patient had hit his head, daughter stated patient said no, that he just fell straight down. Informed patient's daughter that since patient is on a blood thinner, eliquis, that he needs to go the ED to be evaluated. Encouraged her to call 911, especially if patient is unable to walk, or go to the ED.

## 2020-03-14 NOTE — H&P (Signed)
History and Physical    Levi [name] OFB:[id number] DOB: 1929-01-17 DOA: 03/14/2020  PCP: Bernerd Limbo, MD  Patient coming from: Home via EMS  I have personally briefly reviewed patient's old medical records in [place]  Chief Complaint: Near syncope, fall  HPI: Levi [name] is a 86 y.o. male with medical history significant for chronic, systolic and diastolic CHF (last EF 78-24% by TTE 08/28/2018), PAF on Eliquis, history symptomatic bradycardia s/p PPM, CAD s/p CABG and PCI, CKD stage III, emphysema, HTN, HLD, hypothyroidism, history of prostate cancer, BPH, hard of hearing, and OSA on CPAP who presents to the ED for evaluation after a fall at home.  Patient states he has been having several weeks of intermittent lightheadedness/dizziness.  He has been having adjustments of his home diuretics per family.  He says earlier today he was outside walking in his yard around 1 PM when he became "swimmy headed."  He fell to the ground landing on his abdomen.  He did not hit his head, lose consciousness, or suffer any significant injury.  He denies any recent chest pain, nausea, vomiting, dyspnea, cough, subjective fevers, chills, diaphoresis, abdominal pain, dysuria.  EMS were called and he was brought to the ED for further evaluation.  Per ED triage notes sitting BP was 140/90 with standing BP 76/40.  ED Course:  Initial vitals showed BP 119/72, pulse 71, RR 20, temp 97.9 Fahrenheit, SPO2 98% on room air.  Labs notable for sodium 133, potassium 2.5, chloride 83, bicarb 31, BUN 42, creatinine 1.98 (baseline 1.3-1.4 last value 1.33 on 02/08/2020), serum glucose 104, WBC 11.5, hemoglobin 14.5, platelets 262,000, magnesium and BMP pending.  Urinalysis negative for UTI.  SARS-CoV-2 PCR is obtained and pending.  2 view chest x-ray shows low lung volumes, chronic increased interstitial markings, without acute cardiopulmonary abnormality.  Prior CABG changes and left-sided  pacemaker are noted.  Patient was ordered to receive IV K 10 mEq x 3 pounds.  The hospitalist service was consulted to admit for further evaluation and management.  Review of Systems: All systems reviewed and are negative except as documented in history of present illness above.   Past Medical History:  Diagnosis Date  . Asthma   . Benign neoplasm of colon   . CAD (coronary artery disease) Dec 2011   s/p CABG per Dr. Roxy Manns; had normal EF; All SVGs occluded per follow up cath with only LIMA to LAD patent; s/p PCI of the proximal and mid LAD February 2014 per Dr. Burt Knack  . Chronic combined systolic and diastolic CHF (congestive heart failure) ([place])   . CKD (chronic kidney disease), stage II   . Emphysema    "never treated for it" (10/05/2015)  . History of hiatal hernia   . HLD (hyperlipidemia)   . HTN (hypertension)   . Malaise and fatigue   . Obesity   . Osteopenia   . PAF (paroxysmal atrial fibrillation) ([place])   . Pneumonia 1960s X 1; 1990s X 1  . Presence of permanent cardiac pacemaker   . Prostate cancer ([place])   . Skin cancer    "face, nose, top of ears, scalp"  . Symptomatic bradycardia    STJ PPM, 10/05/15, Dr. Lovena Le    Past Surgical History:  Procedure Laterality Date  . ANTERIOR CERVICAL DECOMP/DISCECTOMY FUSION    . BACK SURGERY    . C-EYE SURGERY PROCEDURE    . CATARACT EXTRACTION Right   . CORONARY ANGIOPLASTY WITH STENT PLACEMENT  10/14/12  with stent to proximal and mid LAD  . CORONARY ARTERY BYPASS GRAFT  08/23/10   "CABG X4"  . EP IMPLANTABLE DEVICE N/A 10/05/2015   Procedure: Pacemaker Implant;  Surgeon: [name], MD;  Location: [facility];  Service: Cardiovascular;  Laterality: N/A;  . EP IMPLANTABLE DEVICE N/A 10/06/2015   Procedure: Lead Revision/Repair;  Surgeon: [name], MD;  Location: [facility];  Service: Cardiovascular;  Laterality: N/A;  . EYE SURGERY    . INSERT / REPLACE / REMOVE PACEMAKER  10/05/2015  . LEFT HEART  CATHETERIZATION WITH CORONARY/GRAFT ANGIOGRAM N/A 09/01/2012   Procedure: LEFT HEART CATHETERIZATION WITH [name];  Surgeon: [name], MD;  Location: [facility] CATH LAB;  Service: Cardiovascular;  Laterality: N/A;  . MEDIAN STERNOTOMY  08/23/10  . MOHS SURGERY  "@ least twice"  . PACEMAKER INSERTION     STJ 10/05/15  . PERCUTANEOUS CORONARY STENT INTERVENTION (PCI-S) N/A 10/14/2012   Procedure: PERCUTANEOUS CORONARY STENT INTERVENTION (PCI-S);  Surgeon: [name], MD;  Location: [facility] CATH LAB;  Service: Cardiovascular;  Laterality: N/A;  . PROSTATE BIOPSY    . TRANSURETHRAL RESECTION OF PROSTATE      Social History:  reports that he quit smoking about 44 years ago. His smoking use included cigarettes. He has a 30.00 pack-year smoking history. He quit smokeless tobacco use about 4 years ago.  His smokeless tobacco use included chew. He reports that he does not drink alcohol and does not use drugs.  No Known Allergies  Family History  Problem Relation Age of Onset  . Kidney cancer Father   . Heart attack Father   . Hypertension Mother   . Stroke Mother   . Heart disease Brother   . Skin cancer Brother   . Prostate cancer Brother   . Allergic rhinitis Neg Hx   . Asthma Neg Hx   . Eczema Neg Hx   . Urticaria Neg Hx      Prior to Admission medications   Medication Sig Start Date End Date Taking? Authorizing Provider  amiodarone (PACERONE) 200 MG tablet Take 200 mg by mouth in the morning. 01/25/20  Yes [provider]  Artificial Tear Ointment (DRY EYES OP) Place 1 drop into both eyes 3 (three) times daily as needed (for dryness).   Yes [provider]  cetirizine (ZYRTEC) 10 MG tablet Take 10 mg by mouth daily as needed for allergies or rhinitis.    Yes [provider]  docusate sodium (COLACE) 100 MG capsule Take 100-200 mg by mouth every other day.   Yes [provider]  ELIQUIS 5 MG TABS tablet TAKE 1 TABLET BY MOUTH  TWICE  DAILY Patient taking differently: Take 5 mg by mouth 2 (two) times daily.  03/13/20  Yes [name], MD  levothyroxine (SYNTHROID) 25 MCG tablet Take 1 tablet (25 mcg total) by mouth daily before breakfast. 01/12/20  Yes [name], MD  Melatonin 5 MG TABS Take 5 mg by mouth at bedtime as needed (sleep).  08/07/18  Yes [provider]  nitroGLYCERIN (NITROSTAT) 0.4 MG SL tablet Place 0.4 mg under the tongue every 5 (five) minutes x 3 doses as needed for chest pain. 08/09/13  Yes [name], MD  pantoprazole (PROTONIX) 40 MG tablet Take 1 tablet (40 mg total) by mouth at bedtime. 09/20/19  Yes [name], MD  potassium chloride SA (KLOR-CON) 20 MEQ tablet Take 1 tablet (20 mEq total) by  mouth 2 (two) times daily. Pt must keep upcoming appt with provider for further refills Patient taking differently: Take 20 mEq by mouth in the morning and at bedtime.  03/13/20  Yes [name], MD  pravastatin (PRAVACHOL) 20 MG tablet TAKE 1 TABLET BY MOUTH  DAILY Patient taking differently: Take 20 mg by mouth at bedtime.  12/21/19  Yes [name], MD  tamsulosin (FLOMAX) 0.4 MG CAPS capsule Take 0.4 mg by mouth at bedtime.    Yes [provider]  torsemide (DEMADEX) 20 MG tablet Take 2 tablets (40 mg total) by mouth 2 (two) times daily. 03/10/20  Yes [name], MD  amiodarone (PACERONE) 100 MG tablet Take 1 tablet (100 mg total) by mouth daily. Patient not taking: Reported on 03/14/2020 11/26/19   [name], MD  levothyroxine (SYNTHROID) 50 MCG tablet Take 50 mcg by mouth daily before breakfast. 02/11/20   [provider]  polyethylene glycol (MIRALAX / GLYCOLAX) 17 g packet Take 17 g by mouth daily. Patient not taking: Reported on 03/14/2020    [provider]    Physical Exam: Vitals:   03/14/20 1712 03/14/20 1950 03/14/20 2020 03/14/20 2030  BP: 119/72 129/64 (!) 142/112 (!) 143/77  Pulse: 71 70 70 70  Resp: 20 20 (!) 24 (!) 21  Temp: 97.9 F (36.6 C)       TempSrc: Oral     SpO2: 98% 96% 93% 93%  Weight: 127.9 kg     Height: 5\' 9"  (1.753 m)      Constitutional: Elderly man resting supine in bed with head slightly elevated, NAD, calm, comfortable Eyes: PERRL, lids and conjunctivae normal ENMT: Mucous membranes are dry. Posterior pharynx clear of any exudate or lesions.Normal dentition.  Hard of hearing. Neck: normal, supple, no masses. Respiratory: Faint inspiratory crackles bibasilar lung fields. Normal respiratory effort. No accessory muscle use.  Cardiovascular: Regular rate and rhythm, no murmurs / rubs / gallops. No extremity edema. 2+ pedal pulses.  PPM in place left chest wall. Abdomen: no tenderness, no masses palpated. No hepatosplenomegaly. Bowel sounds positive.  Musculoskeletal: no clubbing / cyanosis. No joint deformity upper and lower extremities. Good ROM, no contractures. Normal muscle tone.  Skin: Dry, no rashes, lesions, ulcers. No induration Neurologic: CN 2-12 grossly intact. Sensation intact, Strength 5/5 in all 4.  Psychiatric: Normal judgment and insight. Alert and oriented x 3. Normal mood.   Labs on Admission: I have personally reviewed following labs and imaging studies  CBC: Recent Labs  Lab 03/14/20 1721  WBC 11.5*  HGB 14.5  HCT 44.4  MCV 93.7  PLT 016   Basic Metabolic Panel: Recent Labs  Lab 03/14/20 1721  NA 133*  K 2.5*  CL 83*  CO2 31  GLUCOSE 104*  BUN 42*  CREATININE 1.98*  CALCIUM 9.5   GFR: Estimated Creatinine Clearance: 33.5 mL/min (A) (by C-G formula based on SCr of 1.98 mg/dL (H)). Liver Function Tests: No results for input(s): AST, ALT, ALKPHOS, BILITOT, PROT, ALBUMIN in the last 168 hours. No results for input(s): LIPASE, AMYLASE in the last 168 hours. No results for input(s): AMMONIA in the last 168 hours. Coagulation Profile: No results for input(s): INR, PROTIME in the last 168 hours. Cardiac Enzymes: No results for input(s): CKTOTAL, CKMB, CKMBINDEX, TROPONINI in the  last 168 hours. BNP (last 3 results) Recent Labs    10/05/19 1202 10/19/19 1132 11/03/19 1054  PROBNP 1,219* 969* 740*   HbA1C: No results for  input(s): HGBA1C in the last 72 hours. CBG: No results for input(s): GLUCAP in the last 168 hours. Lipid Profile: No results for input(s): CHOL, HDL, LDLCALC, TRIG, CHOLHDL, LDLDIRECT in the last 72 hours. Thyroid Function Tests: No results for input(s): TSH, T4TOTAL, FREET4, T3FREE, THYROIDAB in the last 72 hours. Anemia Panel: No results for input(s): VITAMINB12, FOLATE, FERRITIN, TIBC, IRON, RETICCTPCT in the last 72 hours. Urine analysis:    Component Value Date/Time   COLORURINE YELLOW 03/14/2020 2003   APPEARANCEUR CLEAR 03/14/2020 2003   LABSPEC 1.010 03/14/2020 2003   PHURINE 5.0 03/14/2020 2003   GLUCOSEU NEGATIVE 03/14/2020 2003   HGBUR NEGATIVE 03/14/2020 2003   [place] 03/14/2020 2003   [place] 03/14/2020 2003   PROTEINUR NEGATIVE 03/14/2020 2003   UROBILINOGEN 0.2 03/01/2014 1440   NITRITE NEGATIVE 03/14/2020 2003   LEUKOCYTESUR NEGATIVE 03/14/2020 2003    Radiological Exams on Admission: DG Chest 2 View  Result Date: 03/14/2020 CLINICAL DATA:  86 year old male with increased weakness and shortness of breath. EXAM: CHEST - 2 VIEW COMPARISON:  Chest radiographs 08/04/2019 and earlier. FINDINGS: Semi upright AP and lateral views. Stable prior CABG and left chest pacemaker. Mild cardiomegaly and mediastinal contours are stable. Calcified aortic atherosclerosis. Visualized tracheal air column is within normal limits. Lower lung volumes. Stable pulmonary vascularity and/or chronic increased interstitial markings without overt edema. No pneumothorax, pleural effusion or consolidation. Stable visualized osseous structures. Negative visible bowel gas pattern. IMPRESSION: Lower lung volumes. No acute cardiopulmonary abnormality. Electronically Signed   By: Genevie Ann M.D.   On: 03/14/2020 21:14    EKG:  Independently reviewed. AV paced rhythm.  Similar to prior.  Assessment/Plan Principal Problem:   Acute kidney injury superimposed on CKD ([place]) Active Problems:   HLD (hyperlipidemia)   Essential hypertension   EMPHYSEMA   Heart failure with preserved ejection fraction (H[facility])   PAF (paroxysmal atrial fibrillation) (H[facility])   CAD (coronary artery disease)   H/O coronary artery bypass surgery   Chronic anticoagulation   Hypokalemia  Levi [name] is a 86 y.o. male with medical history significant for chronic, systolic and diastolic CHF (last EF 63-01% by TTE 08/28/2018), PAF on Eliquis, history symptomatic bradycardia s/p PPM, CAD s/p CABG and PCI, CKD stage III, emphysema, HTN, HLD, hypothyroidism, history of prostate cancer, BPH, hard of hearing, and OSA on CPAP who is admitted with near syncope and AKI on CKD stage III.  Near-syncope with fall: Likely orthostasis in setting of diuresis.  He does appear volume depleted on admission without peripheral edema. -Monitor on telemetry -Check orthostatic vital signs -Start IV fluid hydration overnight with NS @ 125 mL/hr x 12 hrs -Hold home torsemide -Update echocardiogram -PT/OT eval  AKI on CKD stage III: Likely prerenal from dehydration.  Continue gentle IV fluid hydration overnight and repeat labs in the morning.  Hypokalemia: Magnesium 2.2.  Likely from diuretic use.  Repleting with IV and oral supplementation.  Recheck labs in the morning.  Chronic combined systolic and diastolic CHF: Last EF 60-10% by TTE 08/28/2018.  Appears volume depleted as above. -Continue IV fluids overnight, hold home torsemide -Update echocardiogram -Monitor strict I/O's and daily weights  Paroxysmal atrial fibrillation History of symptomatic bradycardia s/p PPM: Currently in paced rhythm.  On Eliquis for anticoagulation and amiodarone for rhythm control.  Of note amiodarone was recently reduced per cardiology notes due to thyroid  dysfunction. -Continue Eliquis 5 mg twice daily -Continue amiodarone 100 mg daily  CAD s/p CABG and PCI: Chronic  and stable.  Denies any chest pain. -Continue Eliquis and pravastatin  Emphysema/asthma: Reports history of mild asthma not requiring frequent rescue inhaler use.  Continue albuterol as needed.  Hypertension: Currently stable.  Not currently requiring antihypertensives as an outpatient.  Continue monitor.  Hypothyroidism: Last TSH 13.4 on 02/08/2020 in Care Everywhere.  His PCP increase his Synthroid dose from 25 mcg to 50 mcg afterwards.  -We will repeat TSH -Continue Synthroid 50 mcg daily  Hyperlipidemia:  Continue pravastatin.  BPH/history of prostate cancer: Hold Flomax for now due to suspected orthostatic hypotension.  OSA: Continue CPAP nightly.  DVT prophylaxis: Eliquis Code Status: Full code, confirmed with patient Family Communication: Discussed with patient's daughter at bedside Disposition Plan: From home, discharge pending improvement in orthostasis, electrolyte abnormalities, renal function, and PT/OT eval. Consults called: None Admission status:  Status is: Observation  The patient remains OBS appropriate and will d/c before 2 midnights.  Dispo: The patient is from: Home              Anticipated d/c is to: Home pending PT/OT eval              Anticipated d/c date is: 2 days pending improvement in orthostasis, electrolyte abnormalities, renal function, and PT/OT eval              Patient currently is not medically stable to d/c.   Zada Finders MD [facility]  If 7PM-7AM, please contact night-coverage [email or web address]  03/14/2020, 10:37 PM

## 2020-03-14 NOTE — ED Notes (Signed)
Patient transported to X-ray 

## 2020-03-14 NOTE — Telephone Encounter (Signed)
Pt c/o Shortness Of Breath: STAT if SOB developed within the last 24 hours or pt is noticeably SOB on the phone  1. Are you currently SOB (can you hear that pt is SOB on the phone)? Yes, daughter said patient has his CPAP mask on   2. How long have you been experiencing SOB? Always sob, but more so within the last hour. [name] stated it started after he fell.  3. Are you SOB when sitting or when up moving around? Both  4. Are you currently experiencing any other symptoms? [name] stated that when he tried to stand up, he is swimmy headed. He also can not walk without feeling faint. Patient checked his BP while I was on the phone with [name], it was 104/69.

## 2020-03-14 NOTE — ED Notes (Signed)
Pt reports 18lbs weight loss over last week. Pt reports dry weight 280s

## 2020-03-15 ENCOUNTER — Other Ambulatory Visit (HOSPITAL_COMMUNITY): Payer: Medicare Other

## 2020-03-15 ENCOUNTER — Ambulatory Visit: Payer: Medicare Other | Admitting: Physician Assistant

## 2020-03-15 ENCOUNTER — Observation Stay (HOSPITAL_BASED_OUTPATIENT_CLINIC_OR_DEPARTMENT_OTHER): Payer: Medicare Other

## 2020-03-15 DIAGNOSIS — E876 Hypokalemia: Secondary | ICD-10-CM | POA: Diagnosis not present

## 2020-03-15 DIAGNOSIS — N179 Acute kidney failure, unspecified: Secondary | ICD-10-CM | POA: Diagnosis not present

## 2020-03-15 DIAGNOSIS — I361 Nonrheumatic tricuspid (valve) insufficiency: Secondary | ICD-10-CM | POA: Diagnosis not present

## 2020-03-15 DIAGNOSIS — J438 Other emphysema: Secondary | ICD-10-CM

## 2020-03-15 DIAGNOSIS — I10 Essential (primary) hypertension: Secondary | ICD-10-CM

## 2020-03-15 DIAGNOSIS — I480 Paroxysmal atrial fibrillation: Secondary | ICD-10-CM

## 2020-03-15 DIAGNOSIS — Z951 Presence of aortocoronary bypass graft: Secondary | ICD-10-CM | POA: Diagnosis not present

## 2020-03-15 DIAGNOSIS — I25119 Atherosclerotic heart disease of native coronary artery with unspecified angina pectoris: Secondary | ICD-10-CM

## 2020-03-15 DIAGNOSIS — I5030 Unspecified diastolic (congestive) heart failure: Secondary | ICD-10-CM

## 2020-03-15 DIAGNOSIS — N189 Chronic kidney disease, unspecified: Secondary | ICD-10-CM | POA: Diagnosis not present

## 2020-03-15 DIAGNOSIS — Z7901 Long term (current) use of anticoagulants: Secondary | ICD-10-CM

## 2020-03-15 LAB — BASIC METABOLIC PANEL
Anion gap: 17 — ABNORMAL HIGH (ref 5–15)
Anion gap: 15 (ref 5–15)
BUN: 46 mg/dL — ABNORMAL HIGH (ref 8–23)
BUN: 42 mg/dL — ABNORMAL HIGH (ref 8–23)
CO2: 35 mmol/L — ABNORMAL HIGH (ref 22–32)
CO2: 30 mmol/L (ref 22–32)
Calcium: 9 mg/dL (ref 8.9–10.3)
Calcium: 8.7 mg/dL — ABNORMAL LOW (ref 8.9–10.3)
Chloride: 83 mmol/L — ABNORMAL LOW (ref 98–111)
Chloride: 89 mmol/L — ABNORMAL LOW (ref 98–111)
Creatinine, Ser: 2.04 mg/dL — ABNORMAL HIGH (ref 0.61–1.24)
Creatinine, Ser: 1.64 mg/dL — ABNORMAL HIGH (ref 0.61–1.24)
GFR calc Af Amer: 33 mL/min — ABNORMAL LOW (ref >60)
GFR calc Af Amer: 42 mL/min — ABNORMAL LOW (ref >60)
GFR calc non Af Amer: 28 mL/min — ABNORMAL LOW (ref >60)
GFR calc non Af Amer: 37 mL/min — ABNORMAL LOW (ref >60)
Glucose, Bld: 139 mg/dL — ABNORMAL HIGH (ref 70–99)
Glucose, Bld: 156 mg/dL — ABNORMAL HIGH (ref 70–99)
Potassium: 3.4 mmol/L — ABNORMAL LOW (ref 3.5–5.1)
Potassium: 4 mmol/L (ref 3.5–5.1)
Sodium: 135 mmol/L (ref 135–145)
Sodium: 134 mmol/L — ABNORMAL LOW (ref 135–145)

## 2020-03-15 LAB — CBC WITH DIFFERENTIAL/PLATELET
Abs Immature Granulocytes: 0.04 10*3/uL (ref 0.00–0.07)
Basophils Absolute: 0 10*3/uL (ref 0.0–0.1)
Basophils Relative: 0 %
Eosinophils Absolute: 0.2 10*3/uL (ref 0.0–0.5)
Eosinophils Relative: 3 %
HCT: 43 % (ref 39.0–52.0)
Hemoglobin: 13.9 g/dL (ref 13.0–17.0)
Immature Granulocytes: 1 %
Lymphocytes Relative: 16 %
Lymphs Abs: 1.3 10*3/uL (ref 0.7–4.0)
MCH: 31.8 pg (ref 26.0–34.0)
MCHC: 32.3 g/dL (ref 30.0–36.0)
MCV: 98.4 fL (ref 80.0–100.0)
Monocytes Absolute: 0.7 10*3/uL (ref 0.1–1.0)
Monocytes Relative: 8 %
Neutro Abs: 6.3 10*3/uL (ref 1.7–7.7)
Neutrophils Relative %: 72 %
Platelets: 253 10*3/uL (ref 150–400)
RBC: 4.37 MIL/uL (ref 4.22–5.81)
RDW: 14 % (ref 11.5–15.5)
WBC: 8.6 10*3/uL (ref 4.0–10.5)
nRBC: 0 % (ref 0.0–0.2)

## 2020-03-15 LAB — ECHOCARDIOGRAM COMPLETE
Height: 69 in
Weight: 4512 oz

## 2020-03-15 LAB — CBC
HCT: 43.7 % (ref 39.0–52.0)
Hemoglobin: 14.3 g/dL (ref 13.0–17.0)
MCH: 31.4 pg (ref 26.0–34.0)
MCHC: 32.7 g/dL (ref 30.0–36.0)
MCV: 95.8 fL (ref 80.0–100.0)
Platelets: 254 10*3/uL (ref 150–400)
RBC: 4.56 MIL/uL (ref 4.22–5.81)
RDW: 13.9 % (ref 11.5–15.5)
WBC: 12.5 10*3/uL — ABNORMAL HIGH (ref 4.0–10.5)
nRBC: 0 % (ref 0.0–0.2)

## 2020-03-15 LAB — TSH: TSH: 9.568 u[IU]/mL — ABNORMAL HIGH (ref 0.350–4.500)

## 2020-03-15 LAB — CBG MONITORING, ED: Glucose-Capillary: 100 mg/dL — ABNORMAL HIGH (ref 70–99)

## 2020-03-15 MED ORDER — PERFLUTREN LIPID MICROSPHERE
1.0000 mL | INTRAVENOUS | Status: AC | PRN
  Administered 2020-03-15: 2 mL via INTRAVENOUS
  Filled 2020-03-15: qty 10

## 2020-03-15 MED ORDER — TORSEMIDE 20 MG PO TABS: 20.0000 mg | ORAL_TABLET | Freq: Two times a day (BID) | ORAL | 0 refills | Status: DC

## 2020-03-15 MED ORDER — POTASSIUM CHLORIDE 20 MEQ/15ML (10%) PO SOLN
40.0000 meq | Freq: Once | ORAL | Status: AC
  Administered 2020-03-15: 40 meq via ORAL
  Filled 2020-03-15: qty 30

## 2020-03-15 NOTE — ED Notes (Signed)
Breakfast Ordered--Levi [name]  

## 2020-03-15 NOTE — ED Notes (Signed)
Patient verbalizes understanding of discharge instructions . Opportunity for questions and answers were provided . Armband removed by staff ,Pt discharged from ED. W/C  offered at D/C  and Declined W/C at D/C and was escorted to lobby by RN.  

## 2020-03-15 NOTE — ED Notes (Signed)
RN messaged pharmacy in regards to missing dose of eliquis.

## 2020-03-15 NOTE — Discharge Summary (Signed)
Physician Discharge Summary  Caeleb Batalla Mccabe BOF:[id number] DOB: 11-Nov-1928 DOA: 03/14/2020  PCP: Bernerd Limbo, MD  Admit date: 03/14/2020 Discharge date: 03/15/2020  Admitted From: Home Disposition: Home with Home Health PT/OT  Recommendations for Outpatient Follow-up:  1. Follow up with PCP in 1-2 weeks 2. Follow-up with cardiology within 1 to 2 weeks 3. Please obtain CMP/CBC, Mag Phos in one week 4. Please follow up on the following pending results:  Home Health: YES Equipment/Devices: None recommended by PT; Tub Shower Seat   Discharge Condition: Stable CODE STATUS: FULL CODE Diet recommendation: Heart Healthy Diet   Brief/Interim Summary: HPI per Dr. Zada Finders on 03/14/20 Levi [name] is a 86 y.o. male with medical history significant for chronic, systolic and diastolic CHF (last EF 77-82% by TTE 08/28/2018), PAF on Eliquis, history symptomatic bradycardia s/p PPM, CAD s/p CABG and PCI, CKD stage III, emphysema, HTN, HLD, hypothyroidism, history of prostate cancer, BPH, hard of hearing, and OSA on CPAP who presents to the ED for evaluation after a fall at home.  Patient states he has been having several weeks of intermittent lightheadedness/dizziness.  He has been having adjustments of his home diuretics per family.  He says earlier today he was outside walking in his yard around 1 PM when he became "swimmy headed."  He fell to the ground landing on his abdomen.  He did not hit his head, lose consciousness, or suffer any significant injury.  He denies any recent chest pain, nausea, vomiting, dyspnea, cough, subjective fevers, chills, diaphoresis, abdominal pain, dysuria.  EMS were called and he was brought to the ED for further evaluation.  Per ED triage notes sitting BP was 140/90 with standing BP 76/40.  ED Course:  Initial vitals showed BP 119/72, pulse 71, RR 20, temp 97.9 Fahrenheit, SPO2 98% on room air.  Labs notable for sodium 133, potassium 2.5,  chloride 83, bicarb 31, BUN 42, creatinine 1.98 (baseline 1.3-1.4 last value 1.33 on 02/08/2020), serum glucose 104, WBC 11.5, hemoglobin 14.5, platelets 262,000, magnesium and BMP pending.  Urinalysis negative for UTI.  SARS-CoV-2 PCR is obtained and pending.  2 view chest x-ray shows low lung volumes, chronic increased interstitial markings, without acute cardiopulmonary abnormality.  Prior CABG changes and left-sided pacemaker are noted.  Patient was ordered to receive IV K 10 mEq x 3 pounds.  The hospitalist service was consulted to admit for further evaluation and management.  **Interim History She improved with IV fluid hydration and his renal function trended down.  His potassium was repleted and he walked with physical therapy.  Repeat orthostatic vital signs were done and he did not drop.  He was stable to be discharged and PT recommended home health.  We discussed with him about decreasing the amount of torsemide that he has been taking and he understands and he will call his cardiologist in the morning.  We recommend that he go on 20 mg p.o. twice daily instead of 40 mg p.o. twice daily for now and have cardiology further adjust.  Currently stable for discharge and need to follow-up with PCP and cardiology in outpatient setting in 1 to 2 weeks.  Discharge Diagnoses:  Principal Problem:   Acute kidney injury superimposed on CKD ([place]) Active Problems:   HLD (hyperlipidemia)   Essential hypertension   EMPHYSEMA   Heart failure with preserved ejection fraction (HCC)   PAF (paroxysmal atrial fibrillation) (HCC)   CAD (coronary artery disease)   H/O coronary artery bypass surgery   Chronic  anticoagulation   Hypokalemia  Near-syncope with fall: -Likely orthostasis in setting of diuresis.  He does appear volume depleted on admission without peripheral edema. -Monitored on telemetry -Check orthostatic vital signs and he improved -Start IV fluid hydration overnight with NS @ 125 mL/hr  x 12 hrs -Hold home torsemide and have decreased to 20 mg p.o. twice daily -Update echocardiogram as below -PT/OT eval recommending home health PT and OT  AKI on CKD stage IIIb: -Likely prerenal from dehydration.  Continue gentle IV fluid hydration overnight and repeat labs in the morning. -Hemoglobin/hematocrit improved and was 42/1.64 -Avoid further nephrotoxic medications and contrast dyes and follow-up with PCP in outpatient setting  Hypokalemia: -Magnesium 2.2.  Likely from diuretic use.  Repleting with IV and oral supplementation.  Recheck labs in the morning and his potassium is now 4.0 discharge  Chronic combined systolic and diastolic CHF: Last EF 81-19% by TTE 08/28/2018.  Appears volume depleted as above. -Continue IV fluids overnight, hold home torsemide -Update echocardiogram and this shows a new EF of 45 to 50%; did have regional wall motion abnormalities and a follow-up with his primary cardiologist -Monitor strict I/O's and daily weights weight was 282 pounds not repeated -Does not appear volume overloaded at discharge  Paroxysmal atrial fibrillation History of symptomatic bradycardia s/p PPM: Currently in paced rhythm.  On Eliquis for anticoagulation and amiodarone for rhythm control.  Of note amiodarone was recently reduced per cardiology notes due to thyroid dysfunction. -Continue Eliquis 5 mg twice daily -Continue amiodarone 100 mg daily and will continue at discharge  CAD s/p CABG and PCI: Chronic and stable.  Denies any chest pain. -Continue Eliquis and pravastatin  Emphysema/asthma: Reports history of mild asthma not requiring frequent rescue inhaler use.  Continue albuterol as needed.  Hypertension: Currently stable.  Not currently requiring antihypertensives as an outpatient.  Continue monitor.  Hypothyroidism: Last TSH 13.4 on 02/08/2020 in Care Everywhere.  His PCP increase his Synthroid dose from 25 mcg to 50 mcg afterwards.  -We will repeat TSH  and was 9.568 -Continue Synthroid 50 mcg daily and have PCP further adjust  Hyperlipidemia:  Continue pravastatin.  BPH/history of prostate cancer: Hold Flomax for now due to suspected orthostatic hypotension. -Resume at discharge  OSA: Continue CPAP nightly.  Hyponatremia -Mild at 134 -continue to monitor and trend in outpatient setting  Morbid Obesity -Estimated body mass index is 41.64 kg/m as calculated from the following:   Height as of this encounter: 5\' 9"  (1.753 m).   Weight as of this encounter: 127.9 kg. -Weight Loss and Dietary Counseling given  Discharge Instructions  Discharge Instructions    (HEART FAILURE PATIENTS) Call MD:  Anytime you have any of the following symptoms: 1) 3 pound weight gain in 24 hours or 5 pounds in 1 week 2) shortness of breath, with or without a dry hacking cough 3) swelling in the hands, feet or stomach 4) if you have to sleep on extra pillows at night in order to breathe.   Complete by: As directed    Call MD for:  difficulty breathing, headache or visual disturbances   Complete by: As directed    Call MD for:  extreme fatigue   Complete by: As directed    Call MD for:  hives   Complete by: As directed    Call MD for:  persistant dizziness or light-headedness   Complete by: As directed    Call MD for:  persistant nausea and vomiting   Complete by:  As directed    Call MD for:  redness, tenderness, or signs of infection (pain, swelling, redness, odor or green/yellow discharge around incision site)   Complete by: As directed    Call MD for:  severe uncontrolled pain   Complete by: As directed    Call MD for:  temperature >100.4   Complete by: As directed    Diet - low sodium heart healthy   Complete by: As directed    Discharge instructions   Complete by: As directed    You were cared for by a hospitalist during your hospital stay. If you have any questions about your discharge medications or the care you received while you  were in the hospital after you are discharged, you can call the unit and ask to speak with the hospitalist on call if the hospitalist that took care of you is not available. Once you are discharged, your primary care physician will handle any further medical issues. Please note that NO REFILLS for any discharge medications will be authorized once you are discharged, as it is imperative that you return to your primary care physician (or establish a relationship with a primary care physician if you do not have one) for your aftercare needs so that they can reassess your need for medications and monitor your lab values.  Follow up with PCP and Cardiology within 1 week. Take all medications as prescribed. If symptoms change or worsen please return to the ED for evaluation   Increase activity slowly   Complete by: As directed      Allergies as of 03/15/2020   No Known Allergies     Medication List    TAKE these medications   amiodarone 100 MG tablet Commonly known as: PACERONE Take 1 tablet (100 mg total) by mouth daily.   cetirizine 10 MG tablet Commonly known as: ZYRTEC Take 10 mg by mouth daily as needed for allergies or rhinitis.   docusate sodium 100 MG capsule Commonly known as: COLACE Take 100-200 mg by mouth every other day.   DRY EYES OP Place 1 drop into both eyes 3 (three) times daily as needed (for dryness).   Eliquis 5 MG Tabs tablet Generic drug: apixaban TAKE 1 TABLET BY MOUTH  TWICE DAILY What changed: how much to take   levothyroxine 50 MCG tablet Commonly known as: SYNTHROID Take 50 mcg by mouth daily before breakfast.   melatonin 5 MG Tabs Take 5 mg by mouth at bedtime as needed (sleep).   nitroGLYCERIN 0.4 MG SL tablet Commonly known as: NITROSTAT Place 0.4 mg under the tongue every 5 (five) minutes x 3 doses as needed for chest pain.   pantoprazole 40 MG tablet Commonly known as: PROTONIX Take 1 tablet (40 mg total) by mouth at bedtime.   polyethylene  glycol 17 g packet Commonly known as: MIRALAX / GLYCOLAX Take 17 g by mouth daily.   potassium chloride SA 20 MEQ tablet Commonly known as: KLOR-CON Take 1 tablet (20 mEq total) by mouth 2 (two) times daily. Pt must keep upcoming appt with provider for further refills What changed:   when to take this  additional instructions   pravastatin 20 MG tablet Commonly known as: PRAVACHOL TAKE 1 TABLET BY MOUTH  DAILY What changed: when to take this   tamsulosin 0.4 MG Caps capsule Commonly known as: FLOMAX Take 0.4 mg by mouth at bedtime.   torsemide 20 MG tablet Commonly known as: DEMADEX Take 1 tablet (20 mg total) by  mouth 2 (two) times daily. Start taking on: March 17, 2020 What changed:   how much to take  These instructions start on March 17, 2020. If you are unsure what to do until then, ask your doctor or other care provider.       Follow-up Information    [facility].   Specialty: Home Health Services Why: Home Health Services Physical Therapy and Occupational Therapy.  The Therapist will contact you 24-48 hours to start services.  Contact information: [place] [address] [phone]              No Known Allergies  Consultations:  None  Procedures/Studies: DG Chest 2 View  Result Date: 03/14/2020 CLINICAL DATA:  86 year old male with increased weakness and shortness of breath. EXAM: CHEST - 2 VIEW COMPARISON:  Chest radiographs 08/04/2019 and earlier. FINDINGS: Semi upright AP and lateral views. Stable prior CABG and left chest pacemaker. Mild cardiomegaly and mediastinal contours are stable. Calcified aortic atherosclerosis. Visualized tracheal air column is within normal limits. Lower lung volumes. Stable pulmonary vascularity and/or chronic increased interstitial markings without overt edema. No pneumothorax, pleural effusion or consolidation. Stable visualized osseous structures. Negative visible bowel gas  pattern. IMPRESSION: Lower lung volumes. No acute cardiopulmonary abnormality. Electronically Signed   By: Genevie Ann M.D.   On: 03/14/2020 21:14   ECHOCARDIOGRAM COMPLETE  Result Date: 03/15/2020    ECHOCARDIOGRAM REPORT   Patient Name:   Levi [name] Date of Exam: 03/15/2020 Medical Rec #:  [id number]            Height:       69.0 in Accession #:    [id number]           Weight:       282.0 lb Date of Birth:  31-Oct-1928            BSA:          2.391 m Patient Age:    53 years             BP:           118/58 mmHg Patient Gender: M                    HR:           72 bpm. Exam Location:  Inpatient Procedure: 2D Echo, Cardiac Doppler, Color Doppler and Intracardiac            Opacification Agent Indications:    R55 Syncope  History:        Patient has prior history of Echocardiogram examinations, most                 recent 08/28/2018. Cardiomyopathy, CAD, Prior CABG and                 Pacemaker, Arrythmias:Atrial Fibrillation and AV Block,                 Signs/Symptoms:Dizziness/Lightheadedness, Dyspnea and                 Hypotension; Risk Factors:Dyslipidemia and Hypertension. Cancer.                 Kidney disorder.  Sonographer:    Roseanna Rainbow RDCS Referring Phys: [id number] [place]  Sonographer Comments: Technically difficult study due to poor echo windows. Image acquisition challenging due to patient body habitus. IMPRESSIONS  1. Left ventricular ejection fraction, by estimation,  is 45 to 50%. The left ventricle has mildly decreased function. The left ventricle demonstrates regional wall motion abnormalities (see scoring diagram/findings for description). There is mild concentric left ventricular hypertrophy. Left ventricular diastolic parameters are consistent with Grade II diastolic dysfunction (pseudonormalization).  2. Right ventricular systolic function is normal. The right ventricular size is normal. There is mildly elevated pulmonary artery systolic pressure.  3. The mitral valve is normal  in structure. Trivial mitral valve regurgitation. No evidence of mitral stenosis.  4. Tricuspid valve regurgitation is mild to moderate.  5. The aortic valve is tricuspid. Aortic valve regurgitation is not visualized. No aortic stenosis is present.  6. The inferior vena cava is dilated in size with >50% respiratory variability, suggesting right atrial pressure of 8 mmHg. FINDINGS  Left Ventricle: Left ventricular ejection fraction, by estimation, is 45 to 50%. The left ventricle has mildly decreased function. The left ventricle demonstrates regional wall motion abnormalities. Definity contrast agent was given IV to delineate the left ventricular endocardial borders. The left ventricular internal cavity size was normal in size. There is mild concentric left ventricular hypertrophy. Left ventricular diastolic parameters are consistent with Grade II diastolic dysfunction (pseudonormalization). Indeterminate filling pressures.  LV Wall Scoring: The inferior septum, mid and distal inferior wall, and apex are hypokinetic. The entire anterior wall, entire lateral wall, anterior septum, and basal inferior segment are normal. Right Ventricle: The right ventricular size is normal. No increase in right ventricular wall thickness. Right ventricular systolic function is normal. There is mildly elevated pulmonary artery systolic pressure. The tricuspid regurgitant velocity is 2.71  m/s, and with an assumed right atrial pressure of 8 mmHg, the estimated right ventricular systolic pressure is 51.8 mmHg. Left Atrium: Left atrial size was normal in size. Right Atrium: Right atrial size was normal in size. Pericardium: There is no evidence of pericardial effusion. Mitral Valve: The mitral valve is normal in structure. Normal mobility of the mitral valve leaflets. Trivial mitral valve regurgitation. No evidence of mitral valve stenosis. Tricuspid Valve: The tricuspid valve is normal in structure. Tricuspid valve regurgitation is mild  to moderate. No evidence of tricuspid stenosis. Aortic Valve: The aortic valve is tricuspid. . There is mild thickening and mild calcification of the aortic valve. Aortic valve regurgitation is not visualized. No aortic stenosis is present. There is mild thickening of the aortic valve. There is mild calcification of the aortic valve. Pulmonic Valve: The pulmonic valve was normal in structure. Pulmonic valve regurgitation is not visualized. No evidence of pulmonic stenosis. Aorta: The aortic root is normal in size and structure. Venous: The inferior vena cava is dilated in size with greater than 50% respiratory variability, suggesting right atrial pressure of 8 mmHg. IAS/Shunts: No atrial level shunt detected by color flow Doppler.  LEFT VENTRICLE PLAX 2D LVIDd:         4.60 cm     Diastology LVIDs:         3.20 cm     LV e' lateral:   8.16 cm/s LV PW:         1.35 cm     LV E/e' lateral: 7.3 LV IVS:        1.05 cm     LV e' medial:    4.90 cm/s LVOT diam:     2.10 cm     LV E/e' medial:  12.2 LV SV:         67 LV SV Index:   28 LVOT Area:  3.46 cm  LV Volumes (MOD) LV vol d, MOD A2C: 45.5 ml LV vol d, MOD A4C: 77.1 ml LV vol s, MOD A2C: 17.4 ml LV vol s, MOD A4C: 36.7 ml LV SV MOD A2C:     28.1 ml LV SV MOD A4C:     77.1 ml LV SV MOD BP:      36.9 ml RIGHT VENTRICLE             IVC RV S prime:     10.20 cm/s  IVC diam: 2.20 cm TAPSE (M-mode): 1.4 cm LEFT ATRIUM             Index       RIGHT ATRIUM           Index LA diam:        4.10 cm 1.71 cm/m  RA Area:     20.20 cm LA Vol (A2C):   48.5 ml 20.29 ml/m RA Volume:   58.90 ml  24.64 ml/m LA Vol (A4C):   47.2 ml 19.74 ml/m LA Biplane Vol: 48.4 ml 20.24 ml/m  AORTIC VALVE LVOT Vmax:   92.00 cm/s LVOT Vmean:  65.300 cm/s LVOT VTI:    0.192 m  AORTA Ao Root diam: 3.60 cm Ao Asc diam:  3.10 cm MITRAL VALVE               TRICUSPID VALVE MV Area (PHT): 3.91 cm    TR Peak grad:   29.4 mmHg MV Decel Time: 194 msec    TR Vmax:        271.00 cm/s MV E velocity: 59.70  cm/s MV A velocity: 58.10 cm/s  SHUNTS MV E/A ratio:  1.03        Systemic VTI:  0.19 m                            Systemic Diam: 2.10 cm Skeet Latch MD Electronically signed by Skeet Latch MD Signature Date/Time: 03/15/2020/12:19:58 PM    Final      Subjective: Seen and examined at bedside and states that he is doing much better and felt back to baseline.  States that he is no longer dizzy and felt as if he had more energy.  No chest pain, lightheadedness or dizziness.  No other concerns or complaints at this time.  Discharge Exam: Vitals:   03/15/20 1825 03/15/20 1827  BP: (!) 148/62   Pulse: 72   Resp: 20   Temp:  98.1 F (36.7 C)  SpO2: 94%    Vitals:   03/15/20 1300 03/15/20 1302 03/15/20 1825 03/15/20 1827  BP: (!) 162/73  (!) 148/62   Pulse: 72 72 72   Resp: (!) 23 (!) 21 20   Temp:    98.1 F (36.7 C)  TempSrc:    Oral  SpO2: 93% 94% 94%   Weight:      Height:       General: Pt is alert, awake, not in acute distress Cardiovascular: RRR, S1/S2 +, no rubs, no gallops Respiratory: Diminished bilaterally, no wheezing, no rhonchi Abdominal: Soft, NT, distended secondary body habitus, bowel sounds + Extremities: No appreciable edema, no cyanosis  The results of significant diagnostics from this hospitalization (including imaging, microbiology, ancillary and laboratory) are listed below for reference.    Microbiology: Recent Results (from the past 240 hour(s))  SARS Coronavirus 2 by RT PCR (hospital order, performed in [facility] hospital lab) Nasopharyngeal Nasopharyngeal  Swab     Status: None   Collection Time: 03/14/20  9:55 PM   Specimen: Nasopharyngeal Swab  Result Value Ref Range Status   SARS Coronavirus 2 NEGATIVE NEGATIVE Final    Comment: (NOTE) SARS-CoV-2 target nucleic acids are NOT DETECTED.  The SARS-CoV-2 RNA is generally detectable in upper and lower respiratory specimens during the acute phase of infection. The lowest concentration of  SARS-CoV-2 viral copies this assay can detect is 250 copies / mL. A negative result does not preclude SARS-CoV-2 infection and should not be used as the sole basis for treatment or other patient management decisions.  A negative result may occur with improper specimen collection / handling, submission of specimen other than nasopharyngeal swab, presence of viral mutation(s) within the areas targeted by this assay, and inadequate number of viral copies (<250 copies / mL). A negative result must be combined with clinical observations, patient history, and epidemiological information.  Fact Sheet for Patients:   StrictlyIdeas.no  Fact Sheet for Healthcare Providers: BankingDealers.co.za  This test is not yet approved or  cleared by the [place] FDA and has been authorized for detection and/or diagnosis of SARS-CoV-2 by FDA under an Emergency Use Authorization (EUA).  This EUA will remain in effect (meaning this test can be used) for the duration of the COVID-19 declaration under Section 564(b)(1) of the Act, 21 U.S.C. section 360bbb-3(b)(1), unless the authorization is terminated or revoked sooner.  Performed at [facility], [place] [address] [place], [place] [address]     Labs: BNP (last 3 results) Recent Labs    08/03/19 1800 08/04/19 1325 03/14/20 1733  BNP 65.9 54.1 19.6   Basic Metabolic Panel: Recent Labs  Lab 03/14/20 1721 03/15/20 0318 03/15/20 1511  NA 133* 135 134*  K 2.5* 3.4* 4.0  CL 83* 83* 89*  CO2 31 35* 30  GLUCOSE 104* 139* 156*  BUN 42* 46* 42*  CREATININE 1.98* 2.04* 1.64*  CALCIUM 9.5 9.0 8.7*  MG 2.2  --   --    Liver Function Tests: No results for input(s): AST, ALT, ALKPHOS, BILITOT, PROT, ALBUMIN in the last 168 hours. No results for input(s): LIPASE, AMYLASE in the last 168 hours. No results for input(s): AMMONIA in the last 168 hours. CBC: Recent Labs  Lab 03/14/20 1721  03/15/20 0318 03/15/20 1511  WBC 11.5* 12.5* 8.6  NEUTROABS  --   --  6.3  HGB 14.5 14.3 13.9  HCT 44.4 43.7 43.0  MCV 93.7 95.8 98.4  PLT 262 254 253   Cardiac Enzymes: No results for input(s): CKTOTAL, CKMB, CKMBINDEX, TROPONINI in the last 168 hours. BNP: Invalid input(s): POCBNP CBG: Recent Labs  Lab 03/15/20 0532  GLUCAP 100*   D-Dimer No results for input(s): DDIMER in the last 72 hours. Hgb A1c No results for input(s): HGBA1C in the last 72 hours. Lipid Profile No results for input(s): CHOL, HDL, LDLCALC, TRIG, CHOLHDL, LDLDIRECT in the last 72 hours. Thyroid function studies Recent Labs    03/15/20 1512  TSH 9.568*   Anemia work up No results for input(s): VITAMINB12, FOLATE, FERRITIN, TIBC, IRON, RETICCTPCT in the last 72 hours. Urinalysis    Component Value Date/Time   COLORURINE YELLOW 03/14/2020 2003   APPEARANCEUR CLEAR 03/14/2020 2003   LABSPEC 1.010 03/14/2020 2003   PHURINE 5.0 03/14/2020 2003   GLUCOSEU NEGATIVE 03/14/2020 2003   HGBUR NEGATIVE 03/14/2020 2003   [place] NEGATIVE 03/14/2020 2003   [place] NEGATIVE 03/14/2020 2003   PROTEINUR  NEGATIVE 03/14/2020 2003   UROBILINOGEN 0.2 03/01/2014 1440   NITRITE NEGATIVE 03/14/2020 2003   LEUKOCYTESUR NEGATIVE 03/14/2020 2003   Sepsis Labs Invalid input(s): PROCALCITONIN,  WBC,  LACTICIDVEN Microbiology Recent Results (from the past 240 hour(s))  SARS Coronavirus 2 by RT PCR (hospital order, performed in [facility] hospital lab) Nasopharyngeal Nasopharyngeal Swab     Status: None   Collection Time: 03/14/20  9:55 PM   Specimen: Nasopharyngeal Swab  Result Value Ref Range Status   SARS Coronavirus 2 NEGATIVE NEGATIVE Final    Comment: (NOTE) SARS-CoV-2 target nucleic acids are NOT DETECTED.  The SARS-CoV-2 RNA is generally detectable in upper and lower respiratory specimens during the acute phase of infection. The lowest concentration of SARS-CoV-2 viral copies this assay can detect  is 250 copies / mL. A negative result does not preclude SARS-CoV-2 infection and should not be used as the sole basis for treatment or other patient management decisions.  A negative result may occur with improper specimen collection / handling, submission of specimen other than nasopharyngeal swab, presence of viral mutation(s) within the areas targeted by this assay, and inadequate number of viral copies (<250 copies / mL). A negative result must be combined with clinical observations, patient history, and epidemiological information.  Fact Sheet for Patients:   StrictlyIdeas.no  Fact Sheet for Healthcare Providers: BankingDealers.co.za  This test is not yet approved or  cleared by the [place] FDA and has been authorized for detection and/or diagnosis of SARS-CoV-2 by FDA under an Emergency Use Authorization (EUA).  This EUA will remain in effect (meaning this test can be used) for the duration of the COVID-19 declaration under Section 564(b)(1) of the Act, 21 U.S.C. section 360bbb-3(b)(1), unless the authorization is terminated or revoked sooner.  Performed at [facility], [place] [address], [place], [place] [address]    Time coordinating discharge: 35 minutes  SIGNED:  Kerney Elbe, DO [facility] 03/15/2020, 7:38 PM Pager is on [place]  If 7PM-7AM, please contact night-coverage [email or web address]

## 2020-03-15 NOTE — Progress Notes (Signed)
  Echocardiogram 2D Echocardiogram has been performed.  Levi [name] 03/15/2020, 9:35 AM

## 2020-03-15 NOTE — Progress Notes (Signed)
Pt was seen for mobility assessment, and noted his safety increased with RW.  Pt states he has one but not clear if it is in good repair.  Will expect that his family will be there to help as they are living around him and are availiable at all times.  Work on goals of therapy related to strength, balance and endurance, and progress to safe gait on level surfaces, and pt agrees to be inside only unless he has help.     03/15/20 1800  PT Visit Information  Last PT Received On 03/15/20  Assistance Needed +1  History of Present Illness 86 yo male with onset of near syncopal episode was brought to ED, had fall and had to crawl to stand up.  BP was low initially, with days of light headed feelings preceding.  PMHx:  CKD, OSA, hypothyroidism, CHF, bradycardia, CAD, CABG, PCI, emphysema, former smoker, HTN, HLD, prostate CA, CPAP  Subjective Data  Patient Stated Goal return home today  Precautions  Precautions Fall  Precaution Comments monitor O2 sats, HR  Restrictions  Weight Bearing Restrictions No  Pain Assessment  Pain Assessment No/denies pain  Cognition  Arousal/Alertness Awake/alert  Behavior During Therapy WFL for tasks assessed/performed  Overall Cognitive Status Within Functional Limits for tasks assessed  Bed Mobility  Overal bed mobility Needs Assistance  Bed Mobility Supine to Sit;Sit to Supine  Supine to sit Min assist  Sit to supine Min guard  General bed mobility comments min assist due to limits of bed and first time practicing OOB  Transfers  Overall transfer level Needs assistance  Equipment used 1 person hand held assist  Transfers Sit to/from Stand  Sit to Stand Min guard  General transfer comment min guard and used RW for steadying upon standing  Ambulation/Gait  Ambulation/Gait assistance Min guard  Gait Distance (Feet) 110 Feet  Assistive device Rolling walker (2 wheeled)  Gait Pattern/deviations Step-through pattern;Decreased stride length;Wide base of support   General Gait Details shorter steps without RW  Gait velocity reduced  Gait velocity interpretation <1.31 ft/sec, indicative of household ambulator  Balance  Overall balance assessment Needs assistance  Sitting-balance support Feet supported  Sitting balance-Leahy Scale Good  Standing balance support Bilateral upper extremity supported;During functional activity  Standing balance-Leahy Scale Fair  Standing balance comment UE support for standing stability, reaching for walker to steady himself  General Comments  General comments (skin integrity, edema, etc.) VSS  Exercises  Exercises Other exercises (LE strength is grossly WFL)  PT - End of Session  Equipment Utilized During Treatment Gait belt  Activity Tolerance Patient tolerated treatment well;Patient limited by fatigue;Treatment limited secondary to medical complications (Comment)  Patient left in bed;with call bell/phone within reach;with family/visitor present  Nurse Communication Mobility status   PT - Assessment/Plan  PT Visit Diagnosis Unsteadiness on feet (R26.81);History of falling (Z91.81)  PT Frequency (ACUTE ONLY) Min 3X/week  Follow Up Recommendations Home health PT;Supervision for mobility/OOB  PT equipment Rolling walker with 5" wheels  AM-PAC PT "6 Clicks" Mobility Outcome Measure (Version 2)  Help needed turning from your back to your side while in a flat bed without using bedrails? 3  Help needed moving from lying on your back to sitting on the side of a flat bed without using bedrails? 3  Help needed moving to and from a bed to a chair (including a wheelchair)? 3  Help needed standing up from a chair using your arms (e.g., wheelchair or bedside chair)? 3  Help  needed to walk in hospital room? 3  Help needed climbing 3-5 steps with a railing?  2  6 Click Score 17  Consider Recommendation of Discharge To: Home with [facility]  Acute Rehab PT Goals  PT Goal Formulation With patient/family  Time For Goal Achievement  03/29/20  Potential to Achieve Goals Good  PT Time Calculation  PT Start Time (ACUTE ONLY) 1030  PT Stop Time (ACUTE ONLY) 1054  PT Time Calculation (min) (ACUTE ONLY) 24 min  PT General Charges  $$ ACUTE PT VISIT 1 Visit  PT Evaluation  $PT Eval Moderate Complexity 1 Mod  PT Treatments  $Gait Training 8-22 mins    Mee Hives, PT MS Acute Rehab Dept. Number: [place] [facility] [place]

## 2020-03-15 NOTE — TOC Transition Note (Signed)
Transition of Care [facility]) - CM/SW Discharge Note   Patient Details  Name: Levi [name] MRN: [id number] Date of Birth: 08/08/1929  [facility] of [facility] CM/SW Contact:  Laurena Slimmer, RN Phone Number: 03/15/2020, 6:14 PM   Clinical Narrative:             Patient Goals and CMS Choice Patient states their goals for this hospitalization and ongoing recovery are:: I want to go home CMS Medicare.gov Compare Post Acute Care list provided to:: Patient Choice offered to / list presented to : Patient  Discharge Placement                       Discharge Plan and Services  [facility] with [facility]                                   Social Determinants of Health (SDOH) Interventions     Readmission Risk Interventions No flowsheet data found.

## 2020-03-15 NOTE — Evaluation (Signed)
Occupational Therapy Evaluation Patient Details Name: Levi [name] MRN: [id number] DOB: 12/23/1928 Today's Date: 03/15/2020    History of Present Illness 86 yo male with onset of near syncopal episode was brought to ED, had fall and had to crawl to stand up.  BP was low initially, with days of light headed feelings preceding.  PMHx:  CKD, OSA, hypothyroidism, CHF, bradycardia, CAD, CABG, PCI, emphysema, former smoker, HTN, HLD, prostate CA, CPAP   Clinical Impression   PTA pt living alone with as needed assist from family whom live nearby. Pt was able to complete functional mobility without DME a majority of the time, but reports using RW/cane on days of feeling weak. At time of eval, pt completed bed mobility at mod I and sit <> stands with supervision. He then completed a household level of functional mobility at supervision level. Pt is currently limited by poor activity tolerance and implementation of ECS skills. Educated pt on planning, pacing, prioritizing BADL routines. Pt practiced pursed lip breathing, O2 sats 91% on RA with activity- recovers with seated rest breaks. Educated pt on acquiring shower seat to improve independence and safety with bathing. OT will defer to [facility] for further assessment at this time in anticipation for d/c. Thank you for this consult.     Follow Up Recommendations  Home health OT    Equipment Recommendations  Tub/shower seat    Recommendations for Other Services       Precautions / Restrictions Precautions Precautions: Fall Restrictions Weight Bearing Restrictions: No      Mobility Bed Mobility Overal bed mobility: Modified Independent             General bed mobility comments: increased time and effort  Transfers Overall transfer level: Needs assistance Equipment used: 1 person hand held assist Transfers: Sit to/from Stand Sit to Stand: Supervision              Balance Overall balance assessment: Needs  assistance Sitting-balance support: Feet unsupported Sitting balance-Leahy Scale: Good     Standing balance support: No upper extremity supported;During functional activity Standing balance-Leahy Scale: Fair Standing balance comment: note fatigues easily with notable decrease in maintaining balance. would benefit from BUE support for longer distances                           ADL either performed or assessed with clinical judgement   ADL Overall ADL's : Needs assistance/impaired Eating/Feeding: Independent;Sitting   Grooming: Supervision/safety;Standing   Upper Body Bathing: Set up;Sitting   Lower Body Bathing: Sit to/from stand;Sitting/lateral leans;Supervison/ safety Lower Body Bathing Details (indicate cue type and reason): educated on use of shower seat for home to improve safety and ability to engage in LB bathing Upper Body Dressing : Supervision/safety;Sitting   Lower Body Dressing: Supervision/safety;Sitting/lateral leans;Sit to/from stand   Toilet Transfer: Min guard;Ambulation;Regular Toilet   Toileting- Clothing Manipulation and Hygiene: Set up;Sitting/lateral lean;Sit to/from stand       Functional mobility during ADLs: Supervision/safety General ADL Comments: mostly limited by poor activity tolerance. Needing education and cues for ECS implementation     Vision Patient Visual Report: No change from baseline       Perception     Praxis      Pertinent Vitals/Pain Pain Assessment: No/denies pain     Hand Dominance     Extremity/Trunk Assessment Upper Extremity Assessment Upper Extremity Assessment: Overall WFL for tasks assessed   Lower Extremity Assessment Lower Extremity Assessment: Generalized  weakness       Communication Communication Communication: No difficulties   Cognition Arousal/Alertness: Awake/alert Behavior During Therapy: WFL for tasks assessed/performed Overall Cognitive Status: Within Functional Limits for tasks  assessed                                     General Comments  VSS    Exercises     Shoulder Instructions      Home Living Family/patient expects to be discharged to:: Private residence Living Arrangements: Alone Available Help at Discharge: Family;Available PRN/intermittently Type of Home: House Home Access: Stairs to enter [employer] of Steps: 2 Entrance Stairs-Rails: Right Home Layout: One level     Bathroom Shower/Tub: Teacher, early years/pre: Handicapped height     Home Equipment: Cane - single point;Walker - 2 wheels;Walker - 4 wheels;Grab bars - toilet          Prior Functioning/Environment Level of Independence: Independent with assistive device(s)        Comments: uses DME occaionally but states lately he has been able to get around okay        OT Problem List: Decreased strength;Decreased knowledge of use of DME or AE;Decreased activity tolerance;Cardiopulmonary status limiting activity;Impaired balance (sitting and/or standing)      OT Treatment/Interventions:      OT Goals(Current goals can be found in the care plan section) Acute Rehab OT Goals Patient Stated Goal: return home today OT Goal Formulation: All assessment and education complete, DC therapy  OT Frequency:     Barriers to D/C:            Co-evaluation              AM-PAC OT "6 Clicks" Daily Activity     Outcome Measure Help from another person eating meals?: A Little Help from another person taking care of personal grooming?: A Little Help from another person toileting, which includes using toliet, bedpan, or urinal?: A Little Help from another person bathing (including washing, rinsing, drying)?: A Little Help from another person to put on and taking off regular upper body clothing?: A Little Help from another person to put on and taking off regular lower body clothing?: A Little 6 Click Score: 18   End of Session Equipment Utilized  During Treatment: Gait belt Nurse Communication: Mobility status  Activity Tolerance: Patient tolerated treatment well Patient left: in bed;with family/visitor present  OT Visit Diagnosis: Other abnormalities of gait and mobility (R26.89);History of falling (Z91.81)                Time: 6712-4580 OT Time Calculation (min): 19 min Charges:  OT General Charges $OT Visit: 1 Visit OT Evaluation $OT Eval Moderate Complexity: Baker, MSOT, OTR/L Acute Rehabilitation Services [facility] Office Number: [phone] Pager: [phone]  Zenovia Jarred 03/15/2020, 5:44 PM

## 2020-03-16 DIAGNOSIS — N183 Chronic kidney disease, stage 3 unspecified: Secondary | ICD-10-CM | POA: Diagnosis not present

## 2020-03-16 DIAGNOSIS — N179 Acute kidney failure, unspecified: Secondary | ICD-10-CM | POA: Diagnosis not present

## 2020-03-16 DIAGNOSIS — Z951 Presence of aortocoronary bypass graft: Secondary | ICD-10-CM | POA: Diagnosis not present

## 2020-03-16 DIAGNOSIS — Z87891 Personal history of nicotine dependence: Secondary | ICD-10-CM | POA: Diagnosis not present

## 2020-03-16 DIAGNOSIS — J439 Emphysema, unspecified: Secondary | ICD-10-CM | POA: Diagnosis not present

## 2020-03-16 DIAGNOSIS — I5042 Chronic combined systolic (congestive) and diastolic (congestive) heart failure: Secondary | ICD-10-CM | POA: Diagnosis not present

## 2020-03-16 DIAGNOSIS — Z7901 Long term (current) use of anticoagulants: Secondary | ICD-10-CM | POA: Diagnosis not present

## 2020-03-16 DIAGNOSIS — M6281 Muscle weakness (generalized): Secondary | ICD-10-CM | POA: Diagnosis not present

## 2020-03-16 DIAGNOSIS — I130 Hypertensive heart and chronic kidney disease with heart failure and stage 1 through stage 4 chronic kidney disease, or unspecified chronic kidney disease: Secondary | ICD-10-CM | POA: Diagnosis not present

## 2020-03-16 DIAGNOSIS — I480 Paroxysmal atrial fibrillation: Secondary | ICD-10-CM | POA: Diagnosis not present

## 2020-03-16 DIAGNOSIS — E876 Hypokalemia: Secondary | ICD-10-CM | POA: Diagnosis not present

## 2020-03-16 NOTE — ED Notes (Signed)
Breakfast Ordered--Levi [name]  

## 2020-03-17 DIAGNOSIS — N179 Acute kidney failure, unspecified: Secondary | ICD-10-CM | POA: Diagnosis not present

## 2020-03-17 DIAGNOSIS — I480 Paroxysmal atrial fibrillation: Secondary | ICD-10-CM | POA: Diagnosis not present

## 2020-03-17 DIAGNOSIS — I5042 Chronic combined systolic (congestive) and diastolic (congestive) heart failure: Secondary | ICD-10-CM | POA: Diagnosis not present

## 2020-03-17 DIAGNOSIS — M6281 Muscle weakness (generalized): Secondary | ICD-10-CM | POA: Diagnosis not present

## 2020-03-17 DIAGNOSIS — E876 Hypokalemia: Secondary | ICD-10-CM | POA: Diagnosis not present

## 2020-03-17 DIAGNOSIS — I130 Hypertensive heart and chronic kidney disease with heart failure and stage 1 through stage 4 chronic kidney disease, or unspecified chronic kidney disease: Secondary | ICD-10-CM | POA: Diagnosis not present

## 2020-03-20 ENCOUNTER — Other Ambulatory Visit: Payer: Self-pay

## 2020-03-20 ENCOUNTER — Encounter: Payer: Self-pay | Admitting: Physician Assistant

## 2020-03-20 ENCOUNTER — Ambulatory Visit (INDEPENDENT_AMBULATORY_CARE_PROVIDER_SITE_OTHER): Payer: Medicare Other | Admitting: Physician Assistant

## 2020-03-20 VITALS — BP 108/58 | HR 69 | Ht 69.0 in | Wt 298.6 lb

## 2020-03-20 DIAGNOSIS — I480 Paroxysmal atrial fibrillation: Secondary | ICD-10-CM | POA: Diagnosis not present

## 2020-03-20 DIAGNOSIS — I255 Ischemic cardiomyopathy: Secondary | ICD-10-CM | POA: Diagnosis not present

## 2020-03-20 DIAGNOSIS — I5042 Chronic combined systolic (congestive) and diastolic (congestive) heart failure: Secondary | ICD-10-CM | POA: Diagnosis not present

## 2020-03-20 DIAGNOSIS — N182 Chronic kidney disease, stage 2 (mild): Secondary | ICD-10-CM

## 2020-03-20 DIAGNOSIS — I25119 Atherosclerotic heart disease of native coronary artery with unspecified angina pectoris: Secondary | ICD-10-CM | POA: Diagnosis not present

## 2020-03-20 DIAGNOSIS — E032 Hypothyroidism due to medicaments and other exogenous substances: Secondary | ICD-10-CM | POA: Diagnosis not present

## 2020-03-20 DIAGNOSIS — Z950 Presence of cardiac pacemaker: Secondary | ICD-10-CM

## 2020-03-20 MED ORDER — APIXABAN 5 MG PO TABS
5.0000 mg | ORAL_TABLET | Freq: Two times a day (BID) | ORAL | Status: DC
Start: 2020-03-20 — End: 2020-08-14

## 2020-03-20 MED ORDER — TORSEMIDE 20 MG PO TABS
ORAL_TABLET | ORAL | 3 refills | Status: DC
Start: 2020-03-20 — End: 2020-06-29

## 2020-03-20 MED ORDER — LEVOTHYROXINE SODIUM 25 MCG PO TABS: 37.5000 ug | ORAL_TABLET | Freq: Every day | ORAL | 3 refills | Status: DC

## 2020-03-20 NOTE — Progress Notes (Signed)
Cardiology Office Note:    Date:  03/20/2020   ID:  [name], Levi 18-Apr-1929, MRN [id number]  PCP:  Levi Limbo, MD  Cardiologist:  Levi Carnes, MD  Electrophysiologist:  Levi Peru, MD   Referring MD: Levi Limbo, MD   Chief Complaint:  Hospitalization Follow-up (admx with AKI, low K+, weakness)    Patient Profile:    Levi [name] is a 86 y.o. male with:   Coronary artery disease  ? S/p CABG in 2011  ? S/p DES x 2 to LAD in 2013 ? LHC at [facility] in 6/18: patent LIMA-LAD, diff dz elsewhere.All VGs occluded. No targets for PCI>>medical Rx    Parox AFib ? CHADS2-VASc=5 (age x 2, HTN, CAD, CHF) >> Apixaban 5 mg  ? Amiodarone Rx started in 10/2019 due to increased AF burden  Symptomatic bradycardia, Mobitz 2 ? S/p Pacemaker Jan 6160  Combined systolic and diastolic CHF ? Echocardiogram 12/19: EF 40-45 (previously normal) >> Med Rx ? Admx 10/2018 w/ DHF ? Decomp HF 12/2018 managed as OP with Metolazone ? Admx with Decomp CHF 03/2019  Hypertension   Hyperlipidemia   Chronic kidney disease stage 2   OSA on CPAP  Morbid obesity  Prior CV studies: Echocardiogram 03/15/20 EF 45-50, inf and apical HK, mild conc LVH, Gr 2 DD, normal RVSF, trivial MR, mild to mod TR   Echo 08/28/18 Mild conc LVH, EF 40-45, inf/inf-sept/apical HK, Gr 2 DD, MAC, normal RVSF, mod RAE, increased RA pressure, mild TR  Cardiac catheterization 02/10/2017 ([facility]) Coronary angiography reveals 3-vessel CAD.  LM is diffusely diseased with 50% distal stenosis.  LAD has a 80% mid instent restenosis,  LCX has serial 50-60% stenoses in the mid segment, OM2 is a small vessel and has a 70% ostial stenosis.  RCA has long diffuse 50-60% proximal and 60% distal stenosis.  LIMA to LAD is patent with a kink in the mid segment. Flow is normal.  LV-Gram reveals EF 50%. LVEDP is 8 mmHg.  SVG grafts are known to be chronically occluded, so not injected    Nuclear stress test 01/21/2017 ([facility]) 1. Moderate to severe inferoapical inducible ischemia with regadenoson 2. Mild to moderate mid inferior and posterior basal defect. Cannot rule out prior infarct. 3. Mild to moderate hypokinesis of the inferior wall. Dyskinesia/akinesia of the apex. 4. Moderately decreased left ventricular ejection fraction at 43%. 5. Please correlate with a cardiac ultrasound  Echo 06/20/2016 EF 73-71, grade 1 diastolic dysfunction, MAC, moderate LAE  Echo 09/28/2015 Mild concentric LVH, EF 55-60, normal wall motion, MAC, mild MR, severe LAE, normal RVSF, mild RAE, mild TR, PASP 41, GLS -14.8%  Exercise tolerance test 09/27/2015 Poor exercise capacity.  Test stopped early as HR 58 >> 33 and patient was weak. ECGs demonstrate transient complete heart block.  Dr. Cristopher [name] saw patient and discussed proceeding with pacemaker implantation. Echo will be arranged.  Carotid US 09/14/2015 Bilateral ICA 1-39; partial left subclavian steal>>follow-up as needed  Nuclear stress test 11/29/2014 Low risk stress nuclear study with a moderate size, medium intensity, fixed inferior/apical defect consistent with prior infarct; no significant ischemia. LV Ejection Fraction: 56%.  Echo (10/15):  Mild LVH, EF 55-60%, no RWMA, mod LAE, mild TR, PASP 42 mmHg  Nuclear (4/15):  Low risk stress nuclear study with a fixed defect in the apical anterior, inferior walls and in the true apex consistent with distal LAD scar. No ischemia. EF 56%  LHC (  12/13):  Dist LM 60-70%, ostial LAD 40-50%, D1 occluded, prox LAD 80% then aneurysmal segment then 80%, ostial septal perforator 99%, prox CFX 50%, OM branch serial 60% lesions, prox RCA 30%, PL Br 50%; LIMA-LAD patent, SVG-LAD occluded, SVG-OM occluded, SVG-PDA occluded >>Med Rx >>continued angina >> PCI (2/14): 2.25 x 32 mm Promus DES and 2.5 x 20 mm Promus DES to prox and mid  LAD  History of Present Illness:    Mr. Levi was last seen by Dr. Harrington Challenger in 09/2019.  Since then, his AF burden increased on his device interrogation and he was started on Amiodarone.  He was admitted 7/13-7/14 with symptomatic volume depletion with noted AKI and hypokalemia.  He was hydrated and his K+ was replaced. He was asked to go home on Torsemide 20 mg twice daily and follow up with Cardiology.    He returns for follow up.  Of note, I received several MyChart messages prior to this appt from his daughter.  His levothyroxine had been increased by Dr. Harrington Challenger to 25 mcg back in May.  His PCP wanted him to take 50 mcg.  Of note, his TSH in the hospital was still elevated at 9.568.  The patient took metolazone 3 days last week (prior to going to the hospital).  The patient has inadvertently stopped taking amiodarone.  He is here today with his wife.  His breathing has remained about the same.  He has not had chest discomfort.  He uses CPAP at night.  He sleeps in a recliner.  His lower extremity swelling is unchanged.  They brought in a list of his weights.  He has gained about 5 pounds since discharge from the hospital.  Past Medical History:  Diagnosis Date  . Asthma   . Benign neoplasm of colon   . CAD (coronary artery disease) Dec 2011   s/p CABG per Dr. Roxy Manns; had normal EF; All SVGs occluded per follow up cath with only LIMA to LAD patent; s/p PCI of the proximal and mid LAD February 2014 per Dr. Burt Knack  . Chronic combined systolic and diastolic CHF (congestive heart failure) ([place])   . CKD (chronic kidney disease), stage II   . Emphysema    "never treated for it" (10/05/2015)  . History of hiatal hernia   . HLD (hyperlipidemia)   . HTN (hypertension)   . Malaise and fatigue   . Obesity   . Osteopenia   . PAF (paroxysmal atrial fibrillation) ([place])   . Pneumonia 1960s X 1; 1990s X 1  . Presence of permanent cardiac pacemaker   . Prostate cancer ([place])   . Skin cancer    "face, nose, top  of ears, scalp"  . Symptomatic bradycardia    STJ PPM, 10/05/15, Dr. Lovena Le    Current Medications: Current Meds  Medication Sig  . amiodarone (PACERONE) 100 MG tablet Take 1 tablet (100 mg total) by mouth daily.  . Artificial Tear Ointment (DRY EYES OP) Place 1 drop into both eyes 3 (three) times daily as needed (for dryness).  . cetirizine (ZYRTEC) 10 MG tablet Take 10 mg by mouth daily as needed for allergies or rhinitis.   Marland Kitchen nitroGLYCERIN (NITROSTAT) 0.4 MG SL tablet Place 0.4 mg under the tongue every 5 (five) minutes x 3 doses as needed for chest pain.  . pantoprazole (PROTONIX) 40 MG tablet Take 1 tablet (40 mg total) by mouth at bedtime.  . polyethylene glycol (MIRALAX / GLYCOLAX) 17 g packet Take 17 g by mouth  daily.   . potassium chloride SA (KLOR-CON) 20 MEQ tablet Take 1 tablet (20 mEq total) by mouth 2 (two) times daily. Pt must keep upcoming appt with provider for further refills (Patient taking differently: Take 20 mEq by mouth in the morning and at bedtime. )  . pravastatin (PRAVACHOL) 20 MG tablet TAKE 1 TABLET BY MOUTH  DAILY (Patient taking differently: Take 20 mg by mouth at bedtime. )  . tamsulosin (FLOMAX) 0.4 MG CAPS capsule Take 0.4 mg by mouth at bedtime.   . [DISCONTINUED] ELIQUIS 5 MG TABS tablet TAKE 1 TABLET BY MOUTH  TWICE DAILY (Patient taking differently: Take 5 mg by mouth 2 (two) times daily. )  . [DISCONTINUED] levothyroxine (SYNTHROID) 50 MCG tablet Take 50 mcg by mouth daily before breakfast.  . [DISCONTINUED] torsemide (DEMADEX) 20 MG tablet Take 1 tablet (20 mg total) by mouth 2 (two) times daily.     Allergies:   Patient has no known allergies.   Social History   Tobacco Use  . Smoking status: Former Smoker    Packs/day: 1.00    Years: 30.00    Pack years: 30.00    Types: Cigarettes    Quit date: 09/07/1975    Years since quitting: 44.5  . Smokeless tobacco: Former Systems developer    Types: [place] date: 07/27/2015  Vaping Use  . Vaping Use: Never  used  Substance Use Topics  . Alcohol use: No    Alcohol/week: 0.0 standard drinks  . Drug use: No     Family Hx: The patient's family history includes Heart attack in his father; Heart disease in his brother; Hypertension in his mother; Kidney cancer in his father; Prostate cancer in his brother; Skin cancer in his brother; Stroke in his mother. There is no history of Allergic rhinitis, Asthma, Eczema, or Urticaria.  Review of Systems  Gastrointestinal: Positive for hemorrhoids (occ bleeding).     EKGs/Labs/Other Test Reviewed:    EKG:  EKG is not ordered today.  The ekg ordered today demonstrates n/a  Recent Labs: 10/19/2019: ALT 9 11/03/2019: NT-Pro BNP 740 03/14/2020: B Natriuretic Peptide 57.7; Magnesium 2.2 03/15/2020: BUN 42; Creatinine, Ser 1.64; Hemoglobin 13.9; Platelets 253; Potassium 4.0; Sodium 134; TSH 9.568   Recent Lipid Panel Lab Results  Component Value Date/Time   CHOL 134 06/11/2019 04:38 PM   TRIG 254 (H) 06/11/2019 04:38 PM   HDL 31 (L) 06/11/2019 04:38 PM   CHOLHDL 4.3 06/11/2019 04:38 PM   CHOLHDL 3 06/01/2014 09:30 AM   LDLCALC 62 06/11/2019 04:38 PM   LDLDIRECT 70.7 06/10/2013 01:44 PM    Physical Exam:    VS:  BP (!) 108/58   Pulse 69   Ht _0  (1.753 m)   Wt 298 lb 9.6 oz (135.4 kg)   SpO2 94%   BMI 44.10 kg/m     Wt Readings from Last 3 Encounters:  03/20/20 298 lb 9.6 oz (135.4 kg)  03/14/20 282 lb (127.9 kg)  12/08/19 298 lb (135.2 kg)     Constitutional:      Appearance: Healthy appearance. Not in distress.  Pulmonary:     Effort: Pulmonary effort is normal.     Breath sounds: No wheezing. No rales.  Cardiovascular:     Normal rate. Regular rhythm. Normal S1. Normal S2.     Murmurs: There is no murmur.  Edema:    Pretibial: bilateral 1+ edema of the pretibial area.    Comments: Compression stockings in place  Abdominal:     Palpations: Abdomen is soft.  Musculoskeletal:     Cervical back: Neck supple.     Comments: No  presacral edema noted Skin:    General: Skin is warm and dry.  Neurological:     General: No focal deficit present.     Mental Status: Alert and oriented to person, place and time.     Cranial Nerves: Cranial nerves are intact.      ASSESSMENT & PLAN:    1. Chronic combined systolic and diastolic CHF (congestive heart failure) (HCC) EF 45-50 with moderate diastolic dysfunction by echocardiogram in July 2021.  He is NYHA IIIa.  Volume status seems to be stable.  He was recently admitted to the hospital with acute kidney injury and hypokalemia.  He took several doses of metolazone prior to this.  I have instructed the patient to not take metolazone unless instructed by [place].  I have advised him to go ahead and throw away the pills he has at home.  Since his weight has gone up 5 pounds since discharge in the hospital and he is taking a much lower dose of torsemide, I have asked him to increase his torsemide to 40 mg in the morning and 20 mg in the afternoon.  Obtain follow-up BMET today and repeat in 1 week.  I will have him see Dr. Harrington Challenger or me in 6 weeks.  2. Coronary artery disease involving native coronary artery of native heart with angina pectoris ([place]) History of CABG in 2011 and drug-eluting stent x2 to the LAD in 2013. [facility]-L[facility] and diffuse disease elsewhere. All vein grafts were known to be occluded. There were no targets for PCI.  He has been managed medically.  He is doing well without angina.  He is not on aspirin as he is on Apixaban.  Continue pravastatin.  3. PAF (paroxysmal atrial fibrillation) ([place]) He had increased burden and was placed on amiodarone in February.  However, he inadvertently stopped taking this medication approximately 1 month ago.  I have asked him to resume amiodarone 100 mg daily.  Consider rechecking ALT at next visit.  4. Cardiac pacemaker in situ Continue follow-up with EP as planned.  5.  CKD (chronic kidney disease) stage 2, GFR 60-89 ml/min Most recent creatinine 1.64.  We will need to keep a close eye on his renal function.  If his creatinine remains >1.5, we will need to decrease his Apixaban to 2.5 mg twice daily as he is greater than 51 years old.  6. Hypothyroidism due to medication TSH on 03/15/2020 was 9.568.  I have advised him to increase his levothyroxine to 37.5 mcg daily.  Obtain follow-up TSH at next visit in 6 weeks.    Dispo:  Return in about 6 weeks (around 05/01/2020) for Routine Follow Up, w/ Dr. Harrington Challenger, or Richardson Dopp, PA-C, in person.   Medication Adjustments/Labs and Tests Ordered: Current medicines are reviewed at length with the patient today.  Concerns regarding medicines are outlined above.  Tests Ordered: Orders Placed This Encounter  Procedures  . Basic metabolic panel  . Basic metabolic panel   Medication Changes: Meds ordered this encounter  Medications  . apixaban (ELIQUIS) 5 MG TABS tablet    Sig: Take 1 tablet (5 mg total) by mouth 2 (two) times daily.    Order Specific Question:   Supervising Provider    Answer:   Lelon Perla [[id number]]  . levothyroxine (SYNTHROID)  25 MCG tablet    Sig: Take 1.5 tablets (37.5 mcg total) by mouth daily before breakfast.    Dispense:  135 tablet    Refill:  3    Order Specific Question:   Supervising Provider    Answer:   Lelon Perla [[id number]]  . torsemide (DEMADEX) 20 MG tablet    Sig: Take 2 tablets (40 mg) in the morning and 1 tablet (20 mg) in the afternoon.    Dispense:  270 tablet    Refill:  3    Order Specific Question:   Supervising Provider    Answer:   Lelon Perla [[id number]]    Signed, Richardson Dopp, PA-C  03/20/2020 4:27 PM    [facility] [address], [place], [place]  [address] Phone: [phone]; Fax: [phone]

## 2020-03-20 NOTE — Patient Instructions (Addendum)
Medication Instructions:  1. Restart Amiodarone - take 1/2 tablet daily (100 mg) 2. Increase Torsemide to 40 mg (2 tablets) in the morning and 20 mg (1 tablet) in the afternoon 3. Increase Levothyroxine (Synthroid) to 37.5 mg once daily (take 1 and 1/2 tablets of the 25 mg pill)  **DO NOT take Metolazone unless Cardiology tells you to take it (Dr. Harrington Challenger, Richardson Dopp, PA-C or Dr. Lovena Le)  *If you need a refill on your cardiac medications before your next appointment, please call your pharmacy*  Lab Work: 1. Today - BMET 2. In 1 week on 03/27/20- BMET 3. 6-8 weeks - TSH  If you have labs (blood work) drawn today and your tests are completely normal, you will receive your results only by: Marland Kitchen MyChart Message (if you have MyChart) OR . A paper copy in the mail If you have any lab test that is abnormal or we need to change your treatment, we will call you to review the results.  Follow-Up:  On 05/01/20 at 11:15AM with Richardson Dopp, PA-C  Other Instructions Weigh daily Call if your weight increases > 3 lbs in 1 day or > 5 lbs in 1 week

## 2020-03-21 LAB — BASIC METABOLIC PANEL
BUN/Creatinine Ratio: 18 (ref 10–24)
BUN: 28 mg/dL — ABNORMAL HIGH (ref 8–27)
CO2: 27 mmol/L (ref 20–29)
Calcium: 8.9 mg/dL (ref 8.6–10.2)
Chloride: 97 mmol/L (ref 96–106)
Creatinine, Ser: 1.57 mg/dL — ABNORMAL HIGH (ref 0.76–1.27)
GFR calc Af Amer: 45 mL/min/{1.73_m2} — ABNORMAL LOW (ref >59)
GFR calc non Af Amer: 39 mL/min/{1.73_m2} — ABNORMAL LOW (ref >59)
Glucose: 97 mg/dL (ref 65–99)
Potassium: 4.3 mmol/L (ref 3.5–5.2)
Sodium: 138 mmol/L (ref 134–144)

## 2020-03-22 DIAGNOSIS — E876 Hypokalemia: Secondary | ICD-10-CM | POA: Diagnosis not present

## 2020-03-22 DIAGNOSIS — I480 Paroxysmal atrial fibrillation: Secondary | ICD-10-CM | POA: Diagnosis not present

## 2020-03-22 DIAGNOSIS — I5042 Chronic combined systolic (congestive) and diastolic (congestive) heart failure: Secondary | ICD-10-CM | POA: Diagnosis not present

## 2020-03-22 DIAGNOSIS — M6281 Muscle weakness (generalized): Secondary | ICD-10-CM | POA: Diagnosis not present

## 2020-03-22 DIAGNOSIS — N179 Acute kidney failure, unspecified: Secondary | ICD-10-CM | POA: Diagnosis not present

## 2020-03-22 DIAGNOSIS — I130 Hypertensive heart and chronic kidney disease with heart failure and stage 1 through stage 4 chronic kidney disease, or unspecified chronic kidney disease: Secondary | ICD-10-CM | POA: Diagnosis not present

## 2020-03-22 MED ORDER — NITROGLYCERIN 0.4 MG SL SUBL: 0.4000 mg | SUBLINGUAL_TABLET | SUBLINGUAL | 3 refills | Status: AC | PRN

## 2020-03-23 DIAGNOSIS — N179 Acute kidney failure, unspecified: Secondary | ICD-10-CM | POA: Diagnosis not present

## 2020-03-23 DIAGNOSIS — I5042 Chronic combined systolic (congestive) and diastolic (congestive) heart failure: Secondary | ICD-10-CM | POA: Diagnosis not present

## 2020-03-23 DIAGNOSIS — I130 Hypertensive heart and chronic kidney disease with heart failure and stage 1 through stage 4 chronic kidney disease, or unspecified chronic kidney disease: Secondary | ICD-10-CM | POA: Diagnosis not present

## 2020-03-23 DIAGNOSIS — I480 Paroxysmal atrial fibrillation: Secondary | ICD-10-CM | POA: Diagnosis not present

## 2020-03-23 DIAGNOSIS — E876 Hypokalemia: Secondary | ICD-10-CM | POA: Diagnosis not present

## 2020-03-23 DIAGNOSIS — M6281 Muscle weakness (generalized): Secondary | ICD-10-CM | POA: Diagnosis not present

## 2020-03-24 DIAGNOSIS — I130 Hypertensive heart and chronic kidney disease with heart failure and stage 1 through stage 4 chronic kidney disease, or unspecified chronic kidney disease: Secondary | ICD-10-CM | POA: Diagnosis not present

## 2020-03-24 DIAGNOSIS — M6281 Muscle weakness (generalized): Secondary | ICD-10-CM | POA: Diagnosis not present

## 2020-03-24 DIAGNOSIS — I5042 Chronic combined systolic (congestive) and diastolic (congestive) heart failure: Secondary | ICD-10-CM | POA: Diagnosis not present

## 2020-03-24 DIAGNOSIS — I480 Paroxysmal atrial fibrillation: Secondary | ICD-10-CM | POA: Diagnosis not present

## 2020-03-24 DIAGNOSIS — N179 Acute kidney failure, unspecified: Secondary | ICD-10-CM | POA: Diagnosis not present

## 2020-03-24 DIAGNOSIS — E876 Hypokalemia: Secondary | ICD-10-CM | POA: Diagnosis not present

## 2020-03-27 ENCOUNTER — Other Ambulatory Visit: Payer: Medicare Other | Admitting: *Deleted

## 2020-03-27 ENCOUNTER — Other Ambulatory Visit: Payer: Self-pay

## 2020-03-27 DIAGNOSIS — I25119 Atherosclerotic heart disease of native coronary artery with unspecified angina pectoris: Secondary | ICD-10-CM | POA: Diagnosis not present

## 2020-03-27 DIAGNOSIS — M6281 Muscle weakness (generalized): Secondary | ICD-10-CM | POA: Diagnosis not present

## 2020-03-27 DIAGNOSIS — Z950 Presence of cardiac pacemaker: Secondary | ICD-10-CM

## 2020-03-27 DIAGNOSIS — N182 Chronic kidney disease, stage 2 (mild): Secondary | ICD-10-CM | POA: Diagnosis not present

## 2020-03-27 DIAGNOSIS — I5042 Chronic combined systolic (congestive) and diastolic (congestive) heart failure: Secondary | ICD-10-CM | POA: Diagnosis not present

## 2020-03-27 DIAGNOSIS — E032 Hypothyroidism due to medicaments and other exogenous substances: Secondary | ICD-10-CM | POA: Diagnosis not present

## 2020-03-27 DIAGNOSIS — I130 Hypertensive heart and chronic kidney disease with heart failure and stage 1 through stage 4 chronic kidney disease, or unspecified chronic kidney disease: Secondary | ICD-10-CM | POA: Diagnosis not present

## 2020-03-27 DIAGNOSIS — I480 Paroxysmal atrial fibrillation: Secondary | ICD-10-CM

## 2020-03-27 DIAGNOSIS — E876 Hypokalemia: Secondary | ICD-10-CM | POA: Diagnosis not present

## 2020-03-27 DIAGNOSIS — N179 Acute kidney failure, unspecified: Secondary | ICD-10-CM | POA: Diagnosis not present

## 2020-03-28 DIAGNOSIS — I130 Hypertensive heart and chronic kidney disease with heart failure and stage 1 through stage 4 chronic kidney disease, or unspecified chronic kidney disease: Secondary | ICD-10-CM | POA: Diagnosis not present

## 2020-03-28 DIAGNOSIS — E876 Hypokalemia: Secondary | ICD-10-CM | POA: Diagnosis not present

## 2020-03-28 DIAGNOSIS — I5042 Chronic combined systolic (congestive) and diastolic (congestive) heart failure: Secondary | ICD-10-CM | POA: Diagnosis not present

## 2020-03-28 DIAGNOSIS — N179 Acute kidney failure, unspecified: Secondary | ICD-10-CM | POA: Diagnosis not present

## 2020-03-28 DIAGNOSIS — I480 Paroxysmal atrial fibrillation: Secondary | ICD-10-CM | POA: Diagnosis not present

## 2020-03-28 DIAGNOSIS — M6281 Muscle weakness (generalized): Secondary | ICD-10-CM | POA: Diagnosis not present

## 2020-03-28 LAB — BASIC METABOLIC PANEL
BUN/Creatinine Ratio: 14 (ref 10–24)
BUN: 20 mg/dL (ref 8–27)
CO2: 25 mmol/L (ref 20–29)
Calcium: 8.3 mg/dL — ABNORMAL LOW (ref 8.6–10.2)
Chloride: 102 mmol/L (ref 96–106)
Creatinine, Ser: 1.41 mg/dL — ABNORMAL HIGH (ref 0.76–1.27)
GFR calc Af Amer: 51 mL/min/{1.73_m2} — ABNORMAL LOW (ref >59)
GFR calc non Af Amer: 44 mL/min/{1.73_m2} — ABNORMAL LOW (ref >59)
Glucose: 105 mg/dL — ABNORMAL HIGH (ref 65–99)
Potassium: 4.3 mmol/L (ref 3.5–5.2)
Sodium: 140 mmol/L (ref 134–144)

## 2020-03-29 DIAGNOSIS — E876 Hypokalemia: Secondary | ICD-10-CM | POA: Diagnosis not present

## 2020-03-29 DIAGNOSIS — N179 Acute kidney failure, unspecified: Secondary | ICD-10-CM | POA: Diagnosis not present

## 2020-03-29 DIAGNOSIS — I130 Hypertensive heart and chronic kidney disease with heart failure and stage 1 through stage 4 chronic kidney disease, or unspecified chronic kidney disease: Secondary | ICD-10-CM | POA: Diagnosis not present

## 2020-03-29 DIAGNOSIS — M6281 Muscle weakness (generalized): Secondary | ICD-10-CM | POA: Diagnosis not present

## 2020-03-29 DIAGNOSIS — I5042 Chronic combined systolic (congestive) and diastolic (congestive) heart failure: Secondary | ICD-10-CM | POA: Diagnosis not present

## 2020-03-29 DIAGNOSIS — I480 Paroxysmal atrial fibrillation: Secondary | ICD-10-CM | POA: Diagnosis not present

## 2020-03-30 DIAGNOSIS — N179 Acute kidney failure, unspecified: Secondary | ICD-10-CM | POA: Diagnosis not present

## 2020-03-30 DIAGNOSIS — I480 Paroxysmal atrial fibrillation: Secondary | ICD-10-CM | POA: Diagnosis not present

## 2020-03-30 DIAGNOSIS — E876 Hypokalemia: Secondary | ICD-10-CM | POA: Diagnosis not present

## 2020-03-30 DIAGNOSIS — I5042 Chronic combined systolic (congestive) and diastolic (congestive) heart failure: Secondary | ICD-10-CM | POA: Diagnosis not present

## 2020-03-30 DIAGNOSIS — M6281 Muscle weakness (generalized): Secondary | ICD-10-CM | POA: Diagnosis not present

## 2020-03-30 DIAGNOSIS — I130 Hypertensive heart and chronic kidney disease with heart failure and stage 1 through stage 4 chronic kidney disease, or unspecified chronic kidney disease: Secondary | ICD-10-CM | POA: Diagnosis not present

## 2020-04-03 DIAGNOSIS — I480 Paroxysmal atrial fibrillation: Secondary | ICD-10-CM | POA: Diagnosis not present

## 2020-04-03 DIAGNOSIS — M6281 Muscle weakness (generalized): Secondary | ICD-10-CM | POA: Diagnosis not present

## 2020-04-03 DIAGNOSIS — N179 Acute kidney failure, unspecified: Secondary | ICD-10-CM | POA: Diagnosis not present

## 2020-04-03 DIAGNOSIS — E876 Hypokalemia: Secondary | ICD-10-CM | POA: Diagnosis not present

## 2020-04-03 DIAGNOSIS — I5042 Chronic combined systolic (congestive) and diastolic (congestive) heart failure: Secondary | ICD-10-CM | POA: Diagnosis not present

## 2020-04-03 DIAGNOSIS — I130 Hypertensive heart and chronic kidney disease with heart failure and stage 1 through stage 4 chronic kidney disease, or unspecified chronic kidney disease: Secondary | ICD-10-CM | POA: Diagnosis not present

## 2020-04-05 DIAGNOSIS — I5042 Chronic combined systolic (congestive) and diastolic (congestive) heart failure: Secondary | ICD-10-CM | POA: Diagnosis not present

## 2020-04-05 DIAGNOSIS — N179 Acute kidney failure, unspecified: Secondary | ICD-10-CM | POA: Diagnosis not present

## 2020-04-05 DIAGNOSIS — E876 Hypokalemia: Secondary | ICD-10-CM | POA: Diagnosis not present

## 2020-04-05 DIAGNOSIS — I130 Hypertensive heart and chronic kidney disease with heart failure and stage 1 through stage 4 chronic kidney disease, or unspecified chronic kidney disease: Secondary | ICD-10-CM | POA: Diagnosis not present

## 2020-04-05 DIAGNOSIS — I480 Paroxysmal atrial fibrillation: Secondary | ICD-10-CM | POA: Diagnosis not present

## 2020-04-05 DIAGNOSIS — M6281 Muscle weakness (generalized): Secondary | ICD-10-CM | POA: Diagnosis not present

## 2020-04-06 DIAGNOSIS — N179 Acute kidney failure, unspecified: Secondary | ICD-10-CM | POA: Diagnosis not present

## 2020-04-06 DIAGNOSIS — I130 Hypertensive heart and chronic kidney disease with heart failure and stage 1 through stage 4 chronic kidney disease, or unspecified chronic kidney disease: Secondary | ICD-10-CM | POA: Diagnosis not present

## 2020-04-06 DIAGNOSIS — M6281 Muscle weakness (generalized): Secondary | ICD-10-CM | POA: Diagnosis not present

## 2020-04-06 DIAGNOSIS — I5042 Chronic combined systolic (congestive) and diastolic (congestive) heart failure: Secondary | ICD-10-CM | POA: Diagnosis not present

## 2020-04-06 DIAGNOSIS — I480 Paroxysmal atrial fibrillation: Secondary | ICD-10-CM | POA: Diagnosis not present

## 2020-04-06 DIAGNOSIS — E876 Hypokalemia: Secondary | ICD-10-CM | POA: Diagnosis not present

## 2020-04-10 DIAGNOSIS — I130 Hypertensive heart and chronic kidney disease with heart failure and stage 1 through stage 4 chronic kidney disease, or unspecified chronic kidney disease: Secondary | ICD-10-CM | POA: Diagnosis not present

## 2020-04-10 DIAGNOSIS — M6281 Muscle weakness (generalized): Secondary | ICD-10-CM | POA: Diagnosis not present

## 2020-04-10 DIAGNOSIS — N179 Acute kidney failure, unspecified: Secondary | ICD-10-CM | POA: Diagnosis not present

## 2020-04-10 DIAGNOSIS — I480 Paroxysmal atrial fibrillation: Secondary | ICD-10-CM | POA: Diagnosis not present

## 2020-04-10 DIAGNOSIS — E876 Hypokalemia: Secondary | ICD-10-CM | POA: Diagnosis not present

## 2020-04-10 DIAGNOSIS — I5042 Chronic combined systolic (congestive) and diastolic (congestive) heart failure: Secondary | ICD-10-CM | POA: Diagnosis not present

## 2020-04-11 ENCOUNTER — Other Ambulatory Visit: Payer: Medicare Other

## 2020-04-12 ENCOUNTER — Ambulatory Visit (INDEPENDENT_AMBULATORY_CARE_PROVIDER_SITE_OTHER): Payer: Medicare Other | Admitting: *Deleted

## 2020-04-12 DIAGNOSIS — I441 Atrioventricular block, second degree: Secondary | ICD-10-CM | POA: Diagnosis not present

## 2020-04-12 LAB — CUP PACEART REMOTE DEVICE CHECK
Battery Remaining Longevity: 104 mo
Battery Remaining Percentage: 95.5 %
Battery Voltage: 2.98 V
Brady Statistic AP VP Percent: 97 %
Brady Statistic AP VS Percent: 1 %
Brady Statistic AS VP Percent: 3.3 %
Brady Statistic AS VS Percent: 1 %
Brady Statistic RA Percent Paced: 54 %
Brady Statistic RV Percent Paced: 99 %
Date Time Interrogation Session: 20210811085511
Implantable Lead Implant Date: 20170202
Implantable Lead Implant Date: 20170202
Implantable Lead Location: 753859
Implantable Lead Location: 753860
Implantable Pulse Generator Implant Date: 20170202
Lead Channel Impedance Value: 390 Ohm
Lead Channel Impedance Value: 390 Ohm
Lead Channel Pacing Threshold Amplitude: 0.875 V
Lead Channel Pacing Threshold Amplitude: 1.25 V
Lead Channel Pacing Threshold Pulse Width: 0.4 ms
Lead Channel Pacing Threshold Pulse Width: 0.4 ms
Lead Channel Sensing Intrinsic Amplitude: 0.8 mV
Lead Channel Sensing Intrinsic Amplitude: 8.5 mV
Lead Channel Setting Pacing Amplitude: 1.125
Lead Channel Setting Pacing Amplitude: 2.5 V
Lead Channel Setting Pacing Pulse Width: 0.4 ms
Lead Channel Setting Sensing Sensitivity: 2 mV
Pulse Gen Model: 2240
Pulse Gen Serial Number: 7864186

## 2020-04-14 ENCOUNTER — Telehealth: Payer: Self-pay | Admitting: Internal Medicine

## 2020-04-14 NOTE — Telephone Encounter (Signed)
    Jason from [facility] called, he would like to know if Dr. Harrington Challenger received prescription for pt's blood sugar monitor

## 2020-04-14 NOTE — Telephone Encounter (Signed)
Daymon Larsen form [facility] to reach out to the patient's primary care office for prescription for blood sugar monitor.

## 2020-04-15 DIAGNOSIS — E876 Hypokalemia: Secondary | ICD-10-CM | POA: Diagnosis not present

## 2020-04-17 NOTE — Progress Notes (Signed)
Remote pacemaker transmission.   

## 2020-04-30 NOTE — Progress Notes (Signed)
Cardiology Office Note:    Date:  05/01/2020   ID:  Levi, [name] May 14, 1929, MRN [id number]  PCP:  Levi Limbo, MD  Cardiologist:  Levi Carnes, MD  Electrophysiologist:  Levi Peru, MD   Referring MD: Levi Limbo, MD   Chief Complaint:    Follow-up (CHF)    Patient Profile:    Levi [name] is a 86 y.o. male with:   Coronary artery disease  ? S/p CABG in 2011  ? S/p DES x 2 to LAD in 2013 ? LHC at [facility] in 6/18: patent LIMA-LAD, diff dz elsewhere.All VGs occluded. No targets for PCI>>medical Rx    Parox AFib ? CHADS2-VASc=5 (age x 2, HTN, CAD, CHF) >> Apixaban 5 mg  ? Amiodarone Rx started in 10/2019 due to increased AF burden  Symptomatic bradycardia, Mobitz 2 ? S/p Pacemaker Jan 4098  Combined systolic and diastolic CHF ? Echocardiogram 12/19: EF 40-45 (previously normal) >> Med Rx ? Admx 10/2018 w/ DHF ? Decomp HF 12/2018 managed as OP with Metolazone ? Admx with Decomp CHF 03/2019  Hypertension   Hyperlipidemia   Chronic kidney disease stage 2   OSA on CPAP  Morbid obesity  Prior CV studies: Echocardiogram 03/15/20 EF 45-50, inf and apical HK, mild conc LVH, Gr 2 DD, normal RVSF, trivial MR, mild to mod TR   Echo 08/28/18 Mild conc LVH, EF 40-45, inf/inf-sept/apical HK, Gr 2 DD, MAC, normal RVSF, mod RAE, increased RA pressure, mild TR  Cardiac catheterization 02/10/2017 ([facility]) Coronary angiography reveals 3-vessel CAD.  LM is diffusely diseased with 50% distal stenosis.  LAD has a 80% mid instent restenosis,  LCX has serial 50-60% stenoses in the mid segment, OM2 is a small vessel and has a 70% ostial stenosis.  RCA has long diffuse 50-60% proximal and 60% distal stenosis.  LIMA to LAD is patent with a kink in the mid segment. Flow is normal.  LV-Gram reveals EF 50%. LVEDP is 8 mmHg.  SVG grafts are known to be chronically occluded, so not injected   Nuclear stress test 01/21/2017 ([facility]) 1. Moderate to severe inferoapical inducible ischemia with regadenoson 2. Mild to moderate mid inferior and posterior basal defect. Cannot rule out prior infarct. 3. Mild to moderate hypokinesis of the inferior wall. Dyskinesia/akinesia of the apex. 4. Moderately decreased left ventricular ejection fraction at 43%. 5. Please correlate with a cardiac ultrasound  Echo 06/20/2016 EF 11-91, grade 1 diastolic dysfunction, MAC, moderate LAE  Echo 09/28/2015 Mild concentric LVH, EF 55-60, normal wall motion, MAC, mild MR, severe LAE, normal RVSF, mild RAE, mild TR, PASP 41, GLS -14.8%  Exercise tolerance test 09/27/2015 Poor exercise capacity.  Test stopped early as HR 58 >> 33 and patient was weak. ECGs demonstrate transient complete heart block.  Dr. Cristopher [name] saw patient and discussed proceeding with pacemaker implantation. Echo will be arranged.  Carotid US 09/14/2015 Bilateral ICA 1-39; partial left subclavian steal>>follow-up as needed  Nuclear stress test 11/29/2014 Low risk stress nuclear study with a moderate size, medium intensity, fixed inferior/apical defect consistent with prior infarct; no significant ischemia. LV Ejection Fraction: 56%.  Echo (10/15):  Mild LVH, EF 55-60%, no RWMA, mod LAE, mild TR, PASP 42 mmHg  Nuclear (4/15):  Low risk stress nuclear study with a fixed defect in the apical anterior, inferior walls and in the true apex consistent with distal LAD scar. No ischemia. EF 56%  LHC (12/13):  Dist LM  60-70%, ostial LAD 40-50%, D1 occluded, prox LAD 80% then aneurysmal segment then 80%, ostial septal perforator 99%, prox CFX 50%, OM branch serial 60% lesions, prox RCA 30%, PL Br 50%; LIMA-LAD patent, SVG-LAD occluded, SVG-OM occluded, SVG-PDA occluded >>Med Rx >>continued angina >> PCI (2/14): 2.25 x 32 mm Promus DES and 2.5 x 20 mm Promus DES to prox and mid LAD  History of Present Illness:    Levi [name] was  last seen in 03/2020.  He had been admitted with AKI and hypokalemia (he had taken several doses of Metolazone).  His weight was going up and I increased his Torsemide further.  He returns for follow up.  He is here today with his wife.  Overall, he has been fairly stable.  His weights go up and down.  He did increase torsemide to 40 mg twice a day.  His swelling has seemed to remain stable since that time.  He has not had chest discomfort, syncope.  He uses CPAP at night.  His shortness of breath with activity remains stable.  Past Medical History:  Diagnosis Date  . Asthma   . Benign neoplasm of colon   . CAD (coronary artery disease) Dec 2011   s/p CABG per Dr. Roxy [name]; had normal EF; All SVGs occluded per follow up cath with only LIMA to LAD patent; s/p PCI of the proximal and mid LAD February 2014 per Dr. Burt [name]  . Chronic combined systolic and diastolic CHF (congestive heart failure) ([place])   . CKD (chronic kidney disease), stage II   . Emphysema    "never treated for it" (10/05/2015)  . History of hiatal hernia   . HLD (hyperlipidemia)   . HTN (hypertension)   . Malaise and fatigue   . Obesity   . Osteopenia   . PAF (paroxysmal atrial fibrillation) ([place])   . Pneumonia 1960s X 1; 1990s X 1  . Presence of permanent cardiac pacemaker   . Prostate cancer (Levi [name])   . Skin cancer    "face, nose, top of ears, scalp"  . Symptomatic bradycardia    STJ PPM, 10/05/15, Dr. Lovena [name]    Current Medications: Current Meds  Medication Sig  . amiodarone (PACERONE) 100 MG tablet Take 1 tablet (100 mg total) by mouth daily.  Marland Kitchen apixaban (ELIQUIS) 5 MG TABS tablet Take 1 tablet (5 mg total) by mouth 2 (two) times daily.  . Artificial Tear Ointment (DRY EYES OP) Place 1 drop into both eyes 3 (three) times daily as needed (for dryness).  . cetirizine (ZYRTEC) 10 MG tablet Take 10 mg by mouth daily as needed for allergies or rhinitis.   Marland Kitchen levothyroxine (SYNTHROID) 25 MCG tablet Take 1.5 tablets (37.5 mcg total)  by mouth daily before breakfast.  . nitroGLYCERIN (NITROSTAT) 0.4 MG SL tablet Place 1 tablet (0.4 mg total) under the tongue every 5 (five) minutes x 3 doses as needed for chest pain.  . pantoprazole (PROTONIX) 40 MG tablet Take 1 tablet (40 mg total) by mouth at bedtime.  . polyethylene glycol (MIRALAX / GLYCOLAX) 17 g packet Take 17 g by mouth daily.   . potassium chloride SA (KLOR-CON) 20 MEQ tablet Take 1 tablet (20 mEq total) by mouth 2 (two) times daily. Pt must keep upcoming appt with provider for further refills  . pravastatin (PRAVACHOL) 20 MG tablet TAKE 1 TABLET BY MOUTH  DAILY  . tamsulosin (FLOMAX) 0.4 MG CAPS capsule Take 0.4 mg by mouth at bedtime.   . torsemide (DEMADEX) 20  MG tablet Take 2 tablets (40 mg) in the morning and 1 tablet (20 mg) in the afternoon.     Allergies:   Patient has no known allergies.   Social History   Tobacco Use  . Smoking status: Former Smoker    Packs/day: 1.00    Years: 30.00    Pack years: 30.00    Types: Cigarettes    Quit date: 09/07/1975    Years since quitting: 44.6  . Smokeless tobacco: Former Systems developer    Types: [place] date: 07/27/2015  Vaping Use  . Vaping Use: Never used  Substance Use Topics  . Alcohol use: No    Alcohol/week: 0.0 standard drinks  . Drug use: No     Family Hx: The patient's family history includes Heart attack in his father; Heart disease in his brother; Hypertension in his mother; Kidney cancer in his father; Prostate cancer in his brother; Skin cancer in his brother; Stroke in his mother. There is no history of Allergic rhinitis, Asthma, Eczema, or Urticaria.  ROS  See HPI  EKGs/Labs/Other Test Reviewed:    EKG:  EKG is ordered today.  The ekg ordered today demonstrates V paced rhythm, HR 70  Recent Labs: 10/19/2019: ALT 9 11/03/2019: NT-Pro BNP 740 03/14/2020: B Natriuretic Peptide 57.7; Magnesium 2.2 03/15/2020: Hemoglobin 13.9; Platelets 253; TSH 9.568 03/27/2020: BUN 20; Creatinine, Ser 1.41;  Potassium 4.3; Sodium 140   Recent Lipid Panel Lab Results  Component Value Date/Time   CHOL 134 06/11/2019 04:38 PM   TRIG 254 (H) 06/11/2019 04:38 PM   HDL 31 (L) 06/11/2019 04:38 PM   CHOLHDL 4.3 06/11/2019 04:38 PM   CHOLHDL 3 06/01/2014 09:30 AM   LDLCALC 62 06/11/2019 04:38 PM   LDLDIRECT 70.7 06/10/2013 01:44 PM    Physical Exam:    VS:  BP (!) 110/50   Pulse 70   Ht _0  (1.753 m)   Wt (!) 304 lb (137.9 kg)   SpO2 90%   BMI 44.89 kg/m     Wt Readings from Last 3 Encounters:  05/01/20 (!) 304 lb (137.9 kg)  03/20/20 298 lb 9.6 oz (135.4 kg)  03/14/20 282 lb (127.9 kg)     Constitutional:      Appearance: Healthy appearance. Not in distress.  Pulmonary:     Effort: Pulmonary effort is normal.     Breath sounds: No wheezing. No rales.  Cardiovascular:     Normal rate. Regular rhythm. Normal S1. Normal S2.     Murmurs: There is no murmur.  Edema:    Pretibial: bilateral trace pitting edema of the pretibial area.    Ankle: bilateral trace pitting edema of the ankle.    Comments: Compression stockings in place Abdominal:     Palpations: Abdomen is soft.  Musculoskeletal:     Cervical back: Neck supple. Skin:    General: Skin is warm and dry.  Neurological:     General: No focal deficit present.     Mental Status: Alert and oriented to person, place and time.     Cranial Nerves: Cranial nerves are intact.      ASSESSMENT & PLAN:    1. Chronic combined systolic and diastolic CHF (congestive heart failure) (HCC) EF 45-50 with moderate diastolic dysfunction by echocardiogram in July 2021. Ischemic CM.  NYHA 3.  Volume status appears stable.  BP and renal function has limited use of beta-blocker and ACE/ARB.  Continue current dose of Torsemide.  Obtain follow up  labs today with a CMET.  FU in 3 mos.   2. Coronary artery disease involving native coronary artery of native heart with angina pectoris ([place]) History of CABG in 2011 and drug-eluting stent x2 to the  LAD in 2013. [facility]-L[facility] and diffuse disease elsewhere. All vein grafts were known to be occluded. There were no targets for PCI.  He has been managed medically.  He is not having anginal symptoms.  Continue Pravastatin.  He is not on ASA as he is on Apixaban.    3. PAF (paroxysmal atrial fibrillation) ([place]) 4. High risk medication use 5. Hypothyroidism, unspecified type He is managed with Amiodarone and Apixaban.  We will need to keep a close eye on his Creatinine.  If he is consistently 1.5 or higher, we will need to reduce his Apixaban to 2.5 mg twice daily.  He is in the "doughnut hole" and we will see if we can get him some samples today.  His Levothyroxine was adjusted last time.  Obtain follow up CMET, TSH, Free T4 today.    6. CKD (chronic kidney disease) stage 2, GFR 60-89 ml/min As noted, we will continue to monitor his renal function.  Obtain follow up CMET today.    7. Cardiac pacemaker in situ FU with EP as planned.      Dispo:  Return in about 3 months (around 08/01/2020) for Routine Follow Up, w/ Dr. Harrington Challenger, or Richardson Dopp, PA-C, in person.   Medication Adjustments/Labs and Tests Ordered: Current medicines are reviewed at length with the patient today.  Concerns regarding medicines are outlined above.  Tests Ordered: Orders Placed This Encounter  Procedures  . TSH  . T4, free  . Comprehensive metabolic panel  . EKG 12-Lead   Medication Changes: No orders of the defined types were placed in this encounter.   Signed, Richardson Dopp, PA-C  05/01/2020 12:30 PM    [facility] [address], [place], [place]  [address] Phone: [phone]; Fax: [phone]

## 2020-05-01 ENCOUNTER — Other Ambulatory Visit: Payer: Self-pay

## 2020-05-01 ENCOUNTER — Encounter: Payer: Self-pay | Admitting: Physician Assistant

## 2020-05-01 ENCOUNTER — Ambulatory Visit (INDEPENDENT_AMBULATORY_CARE_PROVIDER_SITE_OTHER): Payer: Medicare Other | Admitting: Physician Assistant

## 2020-05-01 VITALS — BP 110/50 | HR 70 | Ht 69.0 in | Wt 304.0 lb

## 2020-05-01 DIAGNOSIS — Z79899 Other long term (current) drug therapy: Secondary | ICD-10-CM

## 2020-05-01 DIAGNOSIS — Z950 Presence of cardiac pacemaker: Secondary | ICD-10-CM

## 2020-05-01 DIAGNOSIS — E039 Hypothyroidism, unspecified: Secondary | ICD-10-CM | POA: Diagnosis not present

## 2020-05-01 DIAGNOSIS — N182 Chronic kidney disease, stage 2 (mild): Secondary | ICD-10-CM

## 2020-05-01 DIAGNOSIS — I255 Ischemic cardiomyopathy: Secondary | ICD-10-CM | POA: Diagnosis not present

## 2020-05-01 DIAGNOSIS — I5042 Chronic combined systolic (congestive) and diastolic (congestive) heart failure: Secondary | ICD-10-CM | POA: Diagnosis not present

## 2020-05-01 DIAGNOSIS — I480 Paroxysmal atrial fibrillation: Secondary | ICD-10-CM | POA: Diagnosis not present

## 2020-05-01 DIAGNOSIS — I25119 Atherosclerotic heart disease of native coronary artery with unspecified angina pectoris: Secondary | ICD-10-CM

## 2020-05-01 NOTE — Patient Instructions (Signed)
Medication Instructions:  Your physician recommends that you continue on your current medications as directed. Please refer to the Current Medication list given to you today.  *If you need a refill on your cardiac medications before your next appointment, please call your pharmacy*  Lab Work: You will have labs drawn today: TSH/Free T4/CMET  If you have labs (blood work) drawn today and your tests are completely normal, you will receive your results only by: Marland Kitchen MyChart Message (if you have MyChart) OR . A paper copy in the mail If you have any lab test that is abnormal or we need to change your treatment, we will call you to review the results.  Testing/Procedures: None ordered today  Follow-Up: On 08/04/20 at 1:20PM with [name], MD

## 2020-05-03 ENCOUNTER — Telehealth: Payer: Self-pay

## 2020-05-03 DIAGNOSIS — E039 Hypothyroidism, unspecified: Secondary | ICD-10-CM

## 2020-05-03 LAB — COMPREHENSIVE METABOLIC PANEL
ALT: 9 IU/L (ref 0–44)
AST: 14 IU/L (ref 0–40)
Albumin/Globulin Ratio: 1.6 (ref 1.2–2.2)
Albumin: 4.2 g/dL (ref 3.6–4.6)
Alkaline Phosphatase: 96 IU/L (ref 48–121)
BUN/Creatinine Ratio: 16 (ref 10–24)
BUN: 24 mg/dL (ref 8–27)
Bilirubin Total: 0.3 mg/dL (ref 0.0–1.2)
CO2: 25 mmol/L (ref 20–29)
Calcium: 8.7 mg/dL (ref 8.6–10.2)
Chloride: 100 mmol/L (ref 96–106)
Creatinine, Ser: 1.46 mg/dL — ABNORMAL HIGH (ref 0.76–1.27)
GFR calc Af Amer: 49 mL/min/{1.73_m2} — ABNORMAL LOW (ref >59)
GFR calc non Af Amer: 42 mL/min/{1.73_m2} — ABNORMAL LOW (ref >59)
Globulin, Total: 2.6 g/dL (ref 1.5–4.5)
Glucose: 90 mg/dL (ref 65–99)
Potassium: 4.2 mmol/L (ref 3.5–5.2)
Sodium: 138 mmol/L (ref 134–144)
Total Protein: 6.8 g/dL (ref 6.0–8.5)

## 2020-05-03 LAB — T4, FREE: Free T4: 1.23 ng/dL (ref 0.82–1.77)

## 2020-05-03 LAB — TSH: TSH: 10.1 u[IU]/mL — ABNORMAL HIGH (ref 0.450–4.500)

## 2020-05-03 MED ORDER — LEVOTHYROXINE SODIUM 50 MCG PO TABS: 50.0000 ug | ORAL_TABLET | Freq: Every day | ORAL | 1 refills | Status: DC

## 2020-05-03 NOTE — Telephone Encounter (Signed)
-----   Message from Liliane Shi, PA-C sent at 05/03/2020  4:23 PM EDT ----- TSH elevated, free T4 normal.  Creatinine stable.  K+, ALT normal. PLAN:   -Increase Synthroid to 50 mcg daily  -TSH in 8 weeks  -Otherwise, continue current medications/treatment plan and follow up as scheduled. Richardson Dopp, PA-C    05/03/2020 4:20 PM

## 2020-05-03 NOTE — Telephone Encounter (Signed)
The patients daughter [name] has been notified of the result and verbalized understanding.  Ok per patient DPR to speak with [name]. All questions (if any) were answered. Mady Haagensen, [place] 05/03/2020 5:00 PM

## 2020-05-18 DIAGNOSIS — M79602 Pain in left arm: Secondary | ICD-10-CM | POA: Diagnosis not present

## 2020-05-18 DIAGNOSIS — Z23 Encounter for immunization: Secondary | ICD-10-CM | POA: Diagnosis not present

## 2020-06-05 DIAGNOSIS — R3912 Poor urinary stream: Secondary | ICD-10-CM | POA: Diagnosis not present

## 2020-06-05 DIAGNOSIS — R350 Frequency of micturition: Secondary | ICD-10-CM | POA: Diagnosis not present

## 2020-06-05 DIAGNOSIS — C61 Malignant neoplasm of prostate: Secondary | ICD-10-CM | POA: Diagnosis not present

## 2020-06-21 DIAGNOSIS — F32A Depression, unspecified: Secondary | ICD-10-CM | POA: Diagnosis not present

## 2020-06-21 DIAGNOSIS — G4733 Obstructive sleep apnea (adult) (pediatric): Secondary | ICD-10-CM | POA: Diagnosis not present

## 2020-06-21 DIAGNOSIS — I509 Heart failure, unspecified: Secondary | ICD-10-CM | POA: Diagnosis not present

## 2020-06-21 DIAGNOSIS — E669 Obesity, unspecified: Secondary | ICD-10-CM | POA: Diagnosis not present

## 2020-06-21 DIAGNOSIS — Z9911 Dependence on respirator [ventilator] status: Secondary | ICD-10-CM | POA: Diagnosis not present

## 2020-06-23 ENCOUNTER — Telehealth: Payer: Self-pay | Admitting: Internal Medicine

## 2020-06-23 DIAGNOSIS — I5042 Chronic combined systolic (congestive) and diastolic (congestive) heart failure: Secondary | ICD-10-CM

## 2020-06-23 NOTE — Telephone Encounter (Signed)
Reviewed with Dr. Harrington Challenger. Pt has taken 40 am, 40 pm of torsemide today. (2nd dose at 2 pm)  Tomorrow and Sunday will take 60 mg am then 40 mg pm.  Monday back to usual dose and come in for labs.  Aware to go to ER for new or worsening symptoms

## 2020-06-23 NOTE — Telephone Encounter (Signed)
Son reports patient is weaker than normal. sats drop when walk to bathroom to 82%, recover w rest to 93% BP HR 127/73, 73  Taking torsemide 40 BID  Weights this week:  10/18 290 Today 294.5  Will review w Dr. Harrington Challenger

## 2020-06-23 NOTE — Telephone Encounter (Signed)
REcomm:  3 torsemide now  In am take 40   then 40 later Sunday  60 mg  then 40 mg  Monday needs:  CBC, CMET, BNP

## 2020-06-23 NOTE — Telephone Encounter (Signed)
Pt c/o swelling: STAT is pt has developed SOB within 24 hours  1) How much weight have you gained and in what time span?per patient's daughter, patient gained 2 lbs in 2-3 days 2) If swelling, where is the swelling located? Both legs and feet  3) Are you currently taking a fluid pill? Yes  4) Are you currently SOB? Per daughter, patient has been SOB on exertion, however, patient is not currently SOB   5) Do you have a log of your daily weights (if so, list)?  294 lbs 292 lbs   6) Have you gained 3 pounds in a day or 5 pounds in a week? No   7) Have you traveled recently? No

## 2020-06-26 ENCOUNTER — Other Ambulatory Visit: Payer: Medicare Other

## 2020-06-26 ENCOUNTER — Other Ambulatory Visit: Payer: Self-pay

## 2020-06-26 DIAGNOSIS — I5042 Chronic combined systolic (congestive) and diastolic (congestive) heart failure: Secondary | ICD-10-CM

## 2020-06-26 DIAGNOSIS — E039 Hypothyroidism, unspecified: Secondary | ICD-10-CM | POA: Diagnosis not present

## 2020-06-27 ENCOUNTER — Telehealth: Payer: Self-pay | Admitting: Physician Assistant

## 2020-06-27 ENCOUNTER — Telehealth: Payer: Self-pay

## 2020-06-27 LAB — CBC
Hematocrit: 39.1 % (ref 37.5–51.0)
Hemoglobin: 12.7 g/dL — ABNORMAL LOW (ref 13.0–17.7)
MCH: 29.7 pg (ref 26.6–33.0)
MCHC: 32.5 g/dL (ref 31.5–35.7)
MCV: 92 fL (ref 79–97)
Platelets: 241 10*3/uL (ref 150–450)
RBC: 4.27 x10E6/uL (ref 4.14–5.80)
RDW: 13.1 % (ref 11.6–15.4)
WBC: 8.2 10*3/uL (ref 3.4–10.8)

## 2020-06-27 LAB — COMPREHENSIVE METABOLIC PANEL
ALT: 12 IU/L (ref 0–44)
AST: 14 IU/L (ref 0–40)
Albumin/Globulin Ratio: 1.5 (ref 1.2–2.2)
Albumin: 4 g/dL (ref 3.6–4.6)
Alkaline Phosphatase: 128 IU/L — ABNORMAL HIGH (ref 44–121)
BUN/Creatinine Ratio: 15 (ref 10–24)
BUN: 24 mg/dL (ref 8–27)
Bilirubin Total: 0.5 mg/dL (ref 0.0–1.2)
CO2: 25 mmol/L (ref 20–29)
Calcium: 8.8 mg/dL (ref 8.6–10.2)
Chloride: 99 mmol/L (ref 96–106)
Creatinine, Ser: 1.56 mg/dL — ABNORMAL HIGH (ref 0.76–1.27)
GFR calc Af Amer: 45 mL/min/{1.73_m2} — ABNORMAL LOW (ref >59)
GFR calc non Af Amer: 39 mL/min/{1.73_m2} — ABNORMAL LOW (ref >59)
Globulin, Total: 2.7 g/dL (ref 1.5–4.5)
Glucose: 129 mg/dL — ABNORMAL HIGH (ref 65–99)
Potassium: 4.5 mmol/L (ref 3.5–5.2)
Sodium: 137 mmol/L (ref 134–144)
Total Protein: 6.7 g/dL (ref 6.0–8.5)

## 2020-06-27 LAB — TSH: TSH: 10.9 u[IU]/mL — ABNORMAL HIGH (ref 0.450–4.500)

## 2020-06-27 LAB — PRO B NATRIURETIC PEPTIDE: NT-Pro BNP: 1125 pg/mL — ABNORMAL HIGH (ref 0–486)

## 2020-06-27 MED ORDER — LEVOTHYROXINE SODIUM 75 MCG PO TABS: 75.0000 ug | ORAL_TABLET | Freq: Every day | ORAL | 1 refills | Status: DC

## 2020-06-27 NOTE — Telephone Encounter (Signed)
**Note De-Identified Margaret Cockerill Obfuscation** While at the office yesterday for lab work the pt requested samples of Eliquis and he was provided 2 boxes. I called the pt to be sure he can afford his Eliquis going forward. The pt states that he received a 90 day supply of his Eliquis from his pharmacy yesterday after leaving the office.  He is advised that he or his daughter should call us back if they have concerns with his Eliquis cost as it looks like he has been denied pt asst with [facility] in the past because he has ins coverage for Eliquis.  He verbalized understanding and thanked me for calling him to discuss.

## 2020-06-27 NOTE — Telephone Encounter (Signed)
[name] the pt's daughter is calling requesting a nurse call her to go over [place]'s lab results so she can explain them to her father. She states she also has a medication question in regards to Scott's comment she saw on Mychart. Please advise.

## 2020-06-27 NOTE — Telephone Encounter (Signed)
The patients daughter [name] has been notified of the result and verbalized understanding. All questions (if any) were answered. Ok to speak with [name] per patient DPR. New dose of Levothyroxine sent to patients pharmacy. Mady Haagensen, [place] 06/27/2020 1:37 PM

## 2020-06-28 ENCOUNTER — Other Ambulatory Visit: Payer: Medicare Other

## 2020-06-28 DIAGNOSIS — I5042 Chronic combined systolic (congestive) and diastolic (congestive) heart failure: Secondary | ICD-10-CM

## 2020-06-28 NOTE — Telephone Encounter (Signed)
Patient's daughter, [name], is calling to follow up. She would like to know if patient's fluid pill needs to be adjusted due to lab results. Please advise.

## 2020-06-29 MED ORDER — TORSEMIDE 20 MG PO TABS: ORAL_TABLET | ORAL | 5 refills | Status: DC

## 2020-06-29 NOTE — Telephone Encounter (Signed)
Left message for patient's daughter [name].  Adv that I would send the new instructions for patient's medications to her to review and adv the patient. Asked her to call or let me know if there are questions. Scheduled repeat lab appt 07/13/20.

## 2020-06-29 NOTE — Telephone Encounter (Signed)
See Result Note from Dr. Harrington Challenger for instructions regarding adjustments in diuretic dose. Richardson Dopp, PA-C    06/29/2020 8:54 AM

## 2020-06-30 NOTE — Telephone Encounter (Signed)
See lab results from Dr. Harrington Challenger, Levi [name] has called patients daughter and reviewed lab results with daughter. Repeat labs set up.

## 2020-07-01 ENCOUNTER — Ambulatory Visit: Payer: Medicare Other | Attending: Internal Medicine

## 2020-07-01 DIAGNOSIS — Z23 Encounter for immunization: Secondary | ICD-10-CM

## 2020-07-01 NOTE — Progress Notes (Signed)
   Covid-19 Vaccination Clinic  Name:  Levi [name]    MRN: [id number] DOB: 08/03/1929  07/01/2020  [name] was observed post Covid-19 immunization for 15 minutes without incident. He was provided with Vaccine Information Sheet and instruction to access the V-Safe system.   [name] was instructed to call 911 with any severe reactions post vaccine: Marland Kitchen Difficulty breathing  . Swelling of face and throat  . A fast heartbeat  . A bad rash all over body  . Dizziness and weakness

## 2020-07-03 ENCOUNTER — Other Ambulatory Visit: Payer: Self-pay | Admitting: Internal Medicine

## 2020-07-04 MED ORDER — POTASSIUM CHLORIDE CRYS ER 20 MEQ PO TBCR
20.0000 meq | EXTENDED_RELEASE_TABLET | Freq: Two times a day (BID) | ORAL | 2 refills | Status: DC
Start: 2020-07-04 — End: 2021-03-12

## 2020-07-05 ENCOUNTER — Other Ambulatory Visit: Payer: Self-pay

## 2020-07-05 MED ORDER — TORSEMIDE 20 MG PO TABS
ORAL_TABLET | ORAL | 2 refills | Status: DC
Start: 2020-07-05 — End: 2020-08-24

## 2020-07-12 ENCOUNTER — Ambulatory Visit (INDEPENDENT_AMBULATORY_CARE_PROVIDER_SITE_OTHER): Payer: Medicare Other

## 2020-07-12 ENCOUNTER — Other Ambulatory Visit: Payer: Medicare Other

## 2020-07-12 ENCOUNTER — Other Ambulatory Visit: Payer: Self-pay

## 2020-07-12 DIAGNOSIS — I5042 Chronic combined systolic (congestive) and diastolic (congestive) heart failure: Secondary | ICD-10-CM | POA: Diagnosis not present

## 2020-07-12 DIAGNOSIS — I441 Atrioventricular block, second degree: Secondary | ICD-10-CM | POA: Diagnosis not present

## 2020-07-12 LAB — CUP PACEART REMOTE DEVICE CHECK
Battery Remaining Longevity: 108 mo
Battery Remaining Percentage: 95.5 %
Battery Voltage: 2.98 V
Brady Statistic AP VP Percent: 97 %
Brady Statistic AP VS Percent: 1 %
Brady Statistic AS VP Percent: 3.3 %
Brady Statistic AS VS Percent: 1 %
Brady Statistic RA Percent Paced: 34 %
Brady Statistic RV Percent Paced: 99 %
Date Time Interrogation Session: 20211110091917
Implantable Lead Implant Date: 20170202
Implantable Lead Implant Date: 20170202
Implantable Lead Location: 753859
Implantable Lead Location: 753860
Implantable Pulse Generator Implant Date: 20170202
Lead Channel Impedance Value: 380 Ohm
Lead Channel Impedance Value: 400 Ohm
Lead Channel Pacing Threshold Amplitude: 1 V
Lead Channel Pacing Threshold Amplitude: 1.25 V
Lead Channel Pacing Threshold Pulse Width: 0.4 ms
Lead Channel Pacing Threshold Pulse Width: 0.4 ms
Lead Channel Sensing Intrinsic Amplitude: 0.8 mV
Lead Channel Sensing Intrinsic Amplitude: 8.5 mV
Lead Channel Setting Pacing Amplitude: 1.25 V
Lead Channel Setting Pacing Amplitude: 2.5 V
Lead Channel Setting Pacing Pulse Width: 0.4 ms
Lead Channel Setting Sensing Sensitivity: 2 mV
Pulse Gen Model: 2240
Pulse Gen Serial Number: 7864186

## 2020-07-13 ENCOUNTER — Other Ambulatory Visit: Payer: Medicare Other

## 2020-07-13 LAB — PRO B NATRIURETIC PEPTIDE: NT-Pro BNP: 1085 pg/mL — ABNORMAL HIGH (ref 0–486)

## 2020-07-13 LAB — BASIC METABOLIC PANEL
BUN/Creatinine Ratio: 18 (ref 10–24)
BUN: 27 mg/dL (ref 8–27)
CO2: 28 mmol/L (ref 20–29)
Calcium: 9 mg/dL (ref 8.6–10.2)
Chloride: 98 mmol/L (ref 96–106)
Creatinine, Ser: 1.53 mg/dL — ABNORMAL HIGH (ref 0.76–1.27)
GFR calc Af Amer: 46 mL/min/{1.73_m2} — ABNORMAL LOW (ref >59)
GFR calc non Af Amer: 40 mL/min/{1.73_m2} — ABNORMAL LOW (ref >59)
Glucose: 100 mg/dL — ABNORMAL HIGH (ref 65–99)
Potassium: 4.5 mmol/L (ref 3.5–5.2)
Sodium: 138 mmol/L (ref 134–144)

## 2020-07-13 NOTE — Progress Notes (Signed)
Remote pacemaker transmission.   

## 2020-07-14 ENCOUNTER — Telehealth: Payer: Self-pay | Admitting: Internal Medicine

## 2020-07-14 NOTE — Telephone Encounter (Signed)
Pt daughter [name] called in and would like to find out about pts lab results from 11/10  Best number -[phone]

## 2020-07-14 NOTE — Telephone Encounter (Signed)
Patient's daughter aware of lab results and to continue same medications/plan unless weights are not stable.

## 2020-07-14 NOTE — Telephone Encounter (Signed)
Lab Results  Component Value Date   NA 138 07/12/2020   K 4.5 07/12/2020   CO2 28 07/12/2020   GLUCOSE 100 (H) 07/12/2020   BUN 27 07/12/2020   CREATININE 1.53 (H) 07/12/2020   CALCIUM 9.0 07/12/2020   GFRNONAA 40 (L) 07/12/2020   GFRAA 46 (L) 07/12/2020    Lab Results  Component Value Date   PROBNP 1,085 (H) 07/12/2020     Looks like Dr. Harrington Challenger adjusted diuretics and ordered labs. Reviewed labs. Creatinine stable, K+ normal, NT-Pro BNP improved somewhat.  PLAN: Ask patient if weight at baseline. If weights stable, would continue current regimen. Will fwd to Dr. Harrington Challenger as well Richardson Dopp, PA-C    07/14/2020 7:56 AM

## 2020-08-04 ENCOUNTER — Other Ambulatory Visit: Payer: Self-pay

## 2020-08-04 ENCOUNTER — Encounter: Payer: Self-pay | Admitting: Internal Medicine

## 2020-08-04 ENCOUNTER — Ambulatory Visit (INDEPENDENT_AMBULATORY_CARE_PROVIDER_SITE_OTHER): Payer: Medicare Other | Admitting: Internal Medicine

## 2020-08-04 VITALS — BP 116/60 | HR 70 | Ht 69.0 in | Wt 299.2 lb

## 2020-08-04 DIAGNOSIS — I480 Paroxysmal atrial fibrillation: Secondary | ICD-10-CM | POA: Diagnosis not present

## 2020-08-04 DIAGNOSIS — I5030 Unspecified diastolic (congestive) heart failure: Secondary | ICD-10-CM | POA: Diagnosis not present

## 2020-08-04 DIAGNOSIS — I255 Ischemic cardiomyopathy: Secondary | ICD-10-CM

## 2020-08-04 DIAGNOSIS — Z79899 Other long term (current) drug therapy: Secondary | ICD-10-CM | POA: Diagnosis not present

## 2020-08-04 NOTE — Progress Notes (Signed)
Cardiology Office Note:    Date:  08/04/2020   ID:  Demari, Gales July 25, 1929, MRN [id number]  PCP:  Bernerd Limbo, MD  Cardiologist:  [name], MD  Electrophysiologist:  Cristopher Peru, MD   Referring MD: Bernerd Limbo, MD   Chief Complaint:  Pt presents for f/u ovf CHF and PAF   Patient Profile:    Levi [name] is a 86 y.o. male with:   Coronary artery disease  ? S/p CABG in 2011  ? S/p DES x 2 to LAD in 2013 ? LHC at [facility] in 6/18: patent LIMA-LAD, diff dz elsewhere.All VGs occluded. No targets for PCI>>medical Rx    Parox AFib ? CHADS2-VASc=5 (age x 2, HTN, CAD, CHF) >> Apixaban 5 mg  ? Amiodarone Rx started in 10/2019 due to increased AF burden  Symptomatic bradycardia, Mobitz 2 ? S/p Pacemaker Jan 3825  Combined systolic and diastolic CHF ? Echocardiogram 12/19: EF 40-45 (previously normal) >> Med Rx ? Admx 10/2018 w/ DHF ? Decomp HF 12/2018 managed as OP with Metolazone ? Admx with Decomp CHF 03/2019  Hypertension   Hyperlipidemia   Chronic kidney disease stage 2   OSA on CPAP  Morbid obesity  Prior CV studies: Echocardiogram 03/15/20 EF 45-50, inf and apical HK, mild conc LVH, Gr 2 DD, normal RVSF, trivial MR, mild to mod TR   Echo 08/28/18 Mild conc LVH, EF 40-45, inf/inf-sept/apical HK, Gr 2 DD, MAC, normal RVSF, mod RAE, increased RA pressure, mild TR  Cardiac catheterization 02/10/2017 ([facility]) Coronary angiography reveals 3-vessel CAD.  LM is diffusely diseased with 50% distal stenosis.  LAD has a 80% mid instent restenosis,  LCX has serial 50-60% stenoses in the mid segment, OM2 is a small vessel and has a 70% ostial stenosis.  RCA has long diffuse 50-60% proximal and 60% distal stenosis.  LIMA to LAD is patent with a kink in the mid segment. Flow is normal.  LV-Gram reveals EF 50%. LVEDP is 8 mmHg.  SVG grafts are known to be chronically occluded, so not injected   Nuclear stress test  01/21/2017 ([facility]) 1. Moderate to severe inferoapical inducible ischemia with regadenoson 2. Mild to moderate mid inferior and posterior basal defect. Cannot rule out prior infarct. 3. Mild to moderate hypokinesis of the inferior wall. Dyskinesia/akinesia of the apex. 4. Moderately decreased left ventricular ejection fraction at 43%. 5. Please correlate with a cardiac ultrasound  Echo 06/20/2016 EF 05-39, grade 1 diastolic dysfunction, MAC, moderate LAE  Echo 09/28/2015 Mild concentric LVH, EF 55-60, normal wall motion, MAC, mild MR, severe LAE, normal RVSF, mild RAE, mild TR, PASP 41, GLS -14.8%  Exercise tolerance test 09/27/2015 Poor exercise capacity.  Test stopped early as HR 58 >> 33 and patient was weak. ECGs demonstrate transient complete heart block.  Dr. Cristopher Peru saw patient and discussed proceeding with pacemaker implantation. Echo will be arranged.  Carotid US 09/14/2015 Bilateral ICA 1-39; partial left subclavian steal>>follow-up as needed  Nuclear stress test 11/29/2014 Low risk stress nuclear study with a moderate size, medium intensity, fixed inferior/apical defect consistent with prior infarct; no significant ischemia. LV Ejection Fraction: 56%.  Echo (10/15):  Mild LVH, EF 55-60%, no RWMA, mod LAE, mild TR, PASP 42 mmHg  Nuclear (4/15):  Low risk stress nuclear study with a fixed defect in the apical anterior, inferior walls and in the true apex consistent with distal LAD scar. No ischemia. EF 56%  LHC (12/13):  Dist LM 60-70%, ostial LAD 40-50%, D1 occluded, prox LAD 80% then aneurysmal segment then 80%, ostial septal perforator 99%, prox CFX 50%, OM branch serial 60% lesions, prox RCA 30%, PL Br 50%; LIMA-LAD patent, SVG-LAD occluded, SVG-OM occluded, SVG-PDA occluded >>Med Rx >>continued angina >> PCI (2/14): 2.25 x 32 mm Promus DES and 2.5 x 20 mm Promus DES to prox and mid LAD  History of Present Illness:     Levi [name] was last seen in clinic by Kathleen Argue earlier this fall IN the interval he deneis CP   He says the meds just make him fee weak, washed out   No PND   Some LE edema  Ankles are up and down   Tries to watch salt   Hard during holidays .  Past Medical History:  Diagnosis Date  . Asthma   . Benign neoplasm of colon   . CAD (coronary artery disease) Dec 2011   s/p CABG per Dr. Roxy Manns; had normal EF; All SVGs occluded per follow up cath with only LIMA to LAD patent; s/p PCI of the proximal and mid LAD February 2014 per Dr. Burt Knack  . Chronic combined systolic and diastolic CHF (congestive heart failure) ([place])   . CKD (chronic kidney disease), stage II   . Emphysema    "never treated for it" (10/05/2015)  . History of hiatal hernia   . HLD (hyperlipidemia)   . HTN (hypertension)   . Malaise and fatigue   . Obesity   . Osteopenia   . PAF (paroxysmal atrial fibrillation) ([place])   . Pneumonia 1960s X 1; 1990s X 1  . Presence of permanent cardiac pacemaker   . Prostate cancer ([place])   . Skin cancer    "face, nose, top of ears, scalp"  . Symptomatic bradycardia    STJ PPM, 10/05/15, Dr. Lovena Le    Current Medications: Current Meds  Medication Sig  . amiodarone (PACERONE) 100 MG tablet Take 1 tablet (100 mg total) by mouth daily.  Marland Kitchen apixaban (ELIQUIS) 5 MG TABS tablet Take 1 tablet (5 mg total) by mouth 2 (two) times daily.  . Artificial Tear Ointment (DRY EYES OP) Place 1 drop into both eyes 3 (three) times daily as needed (for dryness).  . cetirizine (ZYRTEC) 10 MG tablet Take 10 mg by mouth daily as needed for allergies or rhinitis.   Marland Kitchen levothyroxine (SYNTHROID) 75 MCG tablet Take 1 tablet (75 mcg total) by mouth daily before breakfast.  . nitroGLYCERIN (NITROSTAT) 0.4 MG SL tablet Place 1 tablet (0.4 mg total) under the tongue every 5 (five) minutes x 3 doses as needed for chest pain.  . pantoprazole (PROTONIX) 40 MG tablet Take 1 tablet (40 mg total) by mouth at bedtime.  .  polyethylene glycol (MIRALAX / GLYCOLAX) 17 g packet Take 17 g by mouth daily.   . potassium chloride SA (KLOR-CON) 20 MEQ tablet Take 1 tablet (20 mEq total) by mouth 2 (two) times daily.  . pravastatin (PRAVACHOL) 20 MG tablet TAKE 1 TABLET BY MOUTH  DAILY  . tamsulosin (FLOMAX) 0.4 MG CAPS capsule Take 0.4 mg by mouth at bedtime.   . torsemide (DEMADEX) 20 MG tablet Take as directed. Take 2 tablets by mouth twice daily except every Monday and Thursday take 3 tabs in AM and 2 tabs in PM.     Allergies:   Patient has no known allergies.   Social History   Tobacco Use  . Smoking status: Former Smoker    Packs/day:  1.00    Years: 30.00    Pack years: 30.00    Types: Cigarettes    Quit date: 09/07/1975    Years since quitting: 44.9  . Smokeless tobacco: Former Systems developer    Types: [place] date: 07/27/2015  Vaping Use  . Vaping Use: Never used  Substance Use Topics  . Alcohol use: No    Alcohol/week: 0.0 standard drinks  . Drug use: No     Family Hx: The patient's family history includes Heart attack in his father; Heart disease in his brother; Hypertension in his mother; Kidney cancer in his father; Prostate cancer in his brother; Skin cancer in his brother; Stroke in his mother. There is no history of Allergic rhinitis, Asthma, Eczema, or Urticaria.  ROS  See HPI  EKGs/Labs/Other Test Reviewed:    EKG:  EKG is ordered today.  The ekg ordered today demonstrates V paced rhythm, HR 70  Recent Labs: 03/14/2020: B Natriuretic Peptide 57.7; Magnesium 2.2 06/26/2020: ALT 12; Hemoglobin 12.7; Platelets 241; TSH 10.900 07/12/2020: BUN 27; Creatinine, Ser 1.53; NT-Pro BNP 1,085; Potassium 4.5; Sodium 138   Recent Lipid Panel Lab Results  Component Value Date/Time   CHOL 134 06/11/2019 04:38 PM   TRIG 254 (H) 06/11/2019 04:38 PM   HDL 31 (L) 06/11/2019 04:38 PM   CHOLHDL 4.3 06/11/2019 04:38 PM   CHOLHDL 3 06/01/2014 09:30 AM   LDLCALC 62 06/11/2019 04:38 PM   LDLDIRECT 70.7  06/10/2013 01:44 PM    Physical Exam:    VS:  BP 116/60   Pulse 70   Ht _0  (1.753 m)   Wt 299 lb 3.2 oz (135.7 kg)   SpO2 90%   BMI 44.18 kg/m     Wt Readings from Last 3 Encounters:  08/04/20 299 lb 3.2 oz (135.7 kg)  05/01/20 (!) 304 lb (137.9 kg)  03/20/20 298 lb 9.6 oz (135.4 kg)    Morbidly obese 86 yo in NAD NEck is full   Lungs show mild wheezes bilaterally  Cardiac RRR  NoS3  No signif murmurs Abd is distended  Ext Lower extremies with 1+ edema L greater than R  ASSESSMENT & PLAN:    1. Chronic combined systolic and diastolic CHF (congestive heart failure) (HCC) EF 45-50 with moderate diastolic dysfunction by echocardiogram in July 2021. Ischemic CM.  NYHA 3.  Volume does appear to be up some   WIll check labs  BMET   BNP   QUes if meds / diuretics can be pushed   2. Coronary artery disease involving native coronary artery of native heart with angina pectoris [facility]) History of CABG in 2011 and drug-eluting stent x2 to the LAD in 2013. [facility] and diffuse disease elsewhere. All vein grafts were known to be occluded. There were no targets for PCI.  No CP   Follow   3. PAF (paroxysmal atrial fibrillation) (HCC)  On Eliquis and amiodarone   HR is regular     WIll check labs  ? Dosing of eliquis  4. High risk medication use 5. Hypothyroidism, unspecified type On amio and synthroid   Dose was adjusted eariler this fall by Kathleen Argue   Will check again  6. CKD (chronic kidney disease) stage 2, GFR 60-89 ml/min Check BMET   7. Cardiac pacemaker in situ FU with EP   Dispo:  F/U in March 2022  Medication Adjustments/Labs and Tests Ordered: Current medicines are reviewed at  length with the patient today.  Concerns regarding medicines are outlined above.  Tests Ordered: No orders of the defined types were placed in this encounter.  Medication Changes: No orders of the defined types were placed in  this encounter.   Signed, [name], MD  08/04/2020 1:49 PM    [facility] [address], [place], [place]  [address] Phone: [phone]; Fax: [phone]

## 2020-08-04 NOTE — Patient Instructions (Signed)
Medication Instructions:  No changes *If you need a refill on your cardiac medications before your next appointment, please call your pharmacy*   Lab Work: Today: CBC, TSH, BMET, BNP, FREE T3, FREE T4  If you have labs (blood work) drawn today and your tests are completely normal, you will receive your results only by: Marland Kitchen MyChart Message (if you have MyChart) OR . A paper copy in the mail If you have any lab test that is abnormal or we need to change your treatment, we will call you to review the results.   Testing/Procedures: NONE   Follow-Up: At [facility], you and your health needs are our priority.  As part of our continuing mission to provide you with exceptional heart care, we have created designated Provider Care Teams.  These Care Teams include your primary Cardiologist (physician) and Advanced Practice Providers (APPs -  Physician Assistants and Nurse Practitioners) who all work together to provide you with the care you need, when you need it.   Your next appointment:   3 month(s)  The format for your next appointment:   In Person  Provider:   You may see [name], MD or one of the following Advanced Practice Providers on your designated Care Team:    Richardson Dopp, PA-C  Robbie Lis, [place]    Other Instructions

## 2020-08-05 LAB — BASIC METABOLIC PANEL
BUN/Creatinine Ratio: 19 (ref 10–24)
BUN: 31 mg/dL — ABNORMAL HIGH (ref 8–27)
CO2: 25 mmol/L (ref 20–29)
Calcium: 8.7 mg/dL (ref 8.6–10.2)
Chloride: 96 mmol/L (ref 96–106)
Creatinine, Ser: 1.61 mg/dL — ABNORMAL HIGH (ref 0.76–1.27)
GFR calc Af Amer: 43 mL/min/{1.73_m2} — ABNORMAL LOW (ref >59)
GFR calc non Af Amer: 37 mL/min/{1.73_m2} — ABNORMAL LOW (ref >59)
Glucose: 93 mg/dL (ref 65–99)
Potassium: 4.7 mmol/L (ref 3.5–5.2)
Sodium: 136 mmol/L (ref 134–144)

## 2020-08-05 LAB — CBC
Hematocrit: 40.2 % (ref 37.5–51.0)
Hemoglobin: 13 g/dL (ref 13.0–17.7)
MCH: 29.4 pg (ref 26.6–33.0)
MCHC: 32.3 g/dL (ref 31.5–35.7)
MCV: 91 fL (ref 79–97)
Platelets: 199 10*3/uL (ref 150–450)
RBC: 4.42 x10E6/uL (ref 4.14–5.80)
RDW: 13.7 % (ref 11.6–15.4)
WBC: 7 10*3/uL (ref 3.4–10.8)

## 2020-08-05 LAB — TSH: TSH: 9.78 u[IU]/mL — ABNORMAL HIGH (ref 0.450–4.500)

## 2020-08-05 LAB — T3, FREE: T3, Free: 2.4 pg/mL (ref 2.0–4.4)

## 2020-08-05 LAB — T4, FREE: Free T4: 1.5 ng/dL (ref 0.82–1.77)

## 2020-08-05 LAB — PRO B NATRIURETIC PEPTIDE: NT-Pro BNP: 1573 pg/mL — ABNORMAL HIGH (ref 0–486)

## 2020-08-10 DIAGNOSIS — Z0000 Encounter for general adult medical examination without abnormal findings: Secondary | ICD-10-CM | POA: Diagnosis not present

## 2020-08-10 DIAGNOSIS — I509 Heart failure, unspecified: Secondary | ICD-10-CM | POA: Diagnosis not present

## 2020-08-10 DIAGNOSIS — I10 Essential (primary) hypertension: Secondary | ICD-10-CM | POA: Diagnosis not present

## 2020-08-10 DIAGNOSIS — J439 Emphysema, unspecified: Secondary | ICD-10-CM | POA: Diagnosis not present

## 2020-08-10 DIAGNOSIS — I209 Angina pectoris, unspecified: Secondary | ICD-10-CM | POA: Diagnosis not present

## 2020-08-10 DIAGNOSIS — I480 Paroxysmal atrial fibrillation: Secondary | ICD-10-CM | POA: Diagnosis not present

## 2020-08-10 DIAGNOSIS — E785 Hyperlipidemia, unspecified: Secondary | ICD-10-CM | POA: Diagnosis not present

## 2020-08-10 DIAGNOSIS — E039 Hypothyroidism, unspecified: Secondary | ICD-10-CM | POA: Diagnosis not present

## 2020-08-10 DIAGNOSIS — N183 Chronic kidney disease, stage 3 unspecified: Secondary | ICD-10-CM | POA: Diagnosis not present

## 2020-08-11 ENCOUNTER — Other Ambulatory Visit: Payer: Self-pay | Admitting: Internal Medicine

## 2020-08-14 ENCOUNTER — Other Ambulatory Visit: Payer: Self-pay | Admitting: *Deleted

## 2020-08-14 DIAGNOSIS — I480 Paroxysmal atrial fibrillation: Secondary | ICD-10-CM

## 2020-08-14 DIAGNOSIS — L570 Actinic keratosis: Secondary | ICD-10-CM | POA: Diagnosis not present

## 2020-08-14 DIAGNOSIS — L821 Other seborrheic keratosis: Secondary | ICD-10-CM | POA: Diagnosis not present

## 2020-08-14 DIAGNOSIS — Z85828 Personal history of other malignant neoplasm of skin: Secondary | ICD-10-CM | POA: Diagnosis not present

## 2020-08-14 DIAGNOSIS — I5030 Unspecified diastolic (congestive) heart failure: Secondary | ICD-10-CM

## 2020-08-14 NOTE — Telephone Encounter (Signed)
Called and spoke to [name] made her aware pt is going to be on Eliquis 2.5 mg, twice a day. Spoke to pharm D, informed daughter it would be okay for pt to cut the 5mg  Eliquis tablets in half to make 2.5 mg until the Eliquis 2.5 mg tablets came in. Daughter verbalized understanding.

## 2020-08-14 NOTE — Telephone Encounter (Signed)
Prescription refill request for Eliquis 5mg  BID received.   Indication: afib Last office visit: Harrington Challenger, 08/04/2020 Scr: 1.61, 08/04/2020 Age: 86 Weight: 135.7 kg   Pt qualifies for a dose change per dosing criteria. Spoke with Milas Hock D. Will change pt's to Eliquis 2.5 mg BID dosing. Called pt's daughter [name], lmom. Called pt's other daughter, Shauna Hugh (who was not available because she it at work) and spoke to pt to make him aware of the dose change. He asked we update daughter to make sure she was aware and that if we left [name] a message she should call us back.

## 2020-08-15 ENCOUNTER — Other Ambulatory Visit: Payer: Self-pay | Admitting: *Deleted

## 2020-08-15 DIAGNOSIS — I5042 Chronic combined systolic (congestive) and diastolic (congestive) heart failure: Secondary | ICD-10-CM

## 2020-08-15 DIAGNOSIS — I480 Paroxysmal atrial fibrillation: Secondary | ICD-10-CM

## 2020-08-16 ENCOUNTER — Telehealth: Payer: Self-pay | Admitting: Internal Medicine

## 2020-08-16 NOTE — Telephone Encounter (Signed)
Spoke with patient's daughter.  Reviewed the chart. He is to be only taking 100 mg daily of amiodarone. He reports that for about the last month he has been taking 1 and half tablets daily ==300 mg daily.  His daughter who is visiting today reviews the medicines each time she visits.  They would prefer a new prescription to [facility] for a 100 mg tablet, but requesting it not be sent until Jan 3 due to him being in donut hole for coverage.  Pt will start tomorrow taking amio 100 mg daily.  Will route to Dr. Harrington Challenger to make aware.

## 2020-08-16 NOTE — Telephone Encounter (Signed)
Pt c/o medication issue:  1. Name of Medication: amiodarone (PACERONE) 200 mg tablet   2. How are you currently taking this medication (dosage and times per day)? 1 and a 1/2 tablets daily   3. Are you having a reaction (difficulty breathing--STAT)? No   4. What is your medication issue? [name] is calling stating she was going over her Dad's medications with him to make sure he is taking them correctly and noticed he has been taking 300 MG's daily of this medication when the paper they have states to take 100 MG's daily. She states his prescription is for 200 MG's instead of 100 MG's and he has been taking 1 and a 1/2 tablets daily. She is wanting to confirm he is only supposed to take 100 MG's daily as well as if his next refill can be 100 MG's instead of 200 MG's. Please advise.

## 2020-08-17 NOTE — Telephone Encounter (Signed)
Dr. Harrington Challenger aware.  Pt aware to only take 100 mg daily.

## 2020-08-18 ENCOUNTER — Other Ambulatory Visit: Payer: Self-pay

## 2020-08-18 ENCOUNTER — Other Ambulatory Visit: Payer: Medicare Other | Admitting: *Deleted

## 2020-08-18 DIAGNOSIS — I480 Paroxysmal atrial fibrillation: Secondary | ICD-10-CM | POA: Diagnosis not present

## 2020-08-18 DIAGNOSIS — I5030 Unspecified diastolic (congestive) heart failure: Secondary | ICD-10-CM

## 2020-08-19 LAB — BASIC METABOLIC PANEL
BUN/Creatinine Ratio: 14 (ref 10–24)
BUN: 25 mg/dL (ref 8–27)
CO2: 27 mmol/L (ref 20–29)
Calcium: 9 mg/dL (ref 8.6–10.2)
Chloride: 99 mmol/L (ref 96–106)
Creatinine, Ser: 1.79 mg/dL — ABNORMAL HIGH (ref 0.76–1.27)
GFR calc Af Amer: 38 mL/min/{1.73_m2} — ABNORMAL LOW (ref >59)
GFR calc non Af Amer: 33 mL/min/{1.73_m2} — ABNORMAL LOW (ref >59)
Glucose: 105 mg/dL — ABNORMAL HIGH (ref 65–99)
Potassium: 4.3 mmol/L (ref 3.5–5.2)
Sodium: 139 mmol/L (ref 134–144)

## 2020-08-19 LAB — PRO B NATRIURETIC PEPTIDE: NT-Pro BNP: 1380 pg/mL — ABNORMAL HIGH (ref 0–486)

## 2020-08-22 ENCOUNTER — Telehealth: Payer: Self-pay | Admitting: *Deleted

## 2020-08-22 NOTE — Telephone Encounter (Signed)
[name], MD  08/21/2020 3:48 PM EST      Would recomm taking 2 torsemide 2x daily Do not bolus as now taking 3 in am 2x per wk Check BMET and BNP next Wed   My Chart message sent with recommendations and left message to call back.

## 2020-08-23 NOTE — Telephone Encounter (Signed)
THese are subtle increases in synthroid   I think, given that 75 mcg has normalized TSH probably increasing to 100 mcg per day is the right thing to do    F/U TSH in 2 months

## 2020-08-24 ENCOUNTER — Telehealth: Payer: Self-pay | Admitting: Internal Medicine

## 2020-08-24 DIAGNOSIS — N182 Chronic kidney disease, stage 2 (mild): Secondary | ICD-10-CM

## 2020-08-24 DIAGNOSIS — Z79899 Other long term (current) drug therapy: Secondary | ICD-10-CM

## 2020-08-24 DIAGNOSIS — E039 Hypothyroidism, unspecified: Secondary | ICD-10-CM

## 2020-08-24 DIAGNOSIS — I5042 Chronic combined systolic (congestive) and diastolic (congestive) heart failure: Secondary | ICD-10-CM

## 2020-08-24 MED ORDER — TORSEMIDE 20 MG PO TABS: 40.0000 mg | ORAL_TABLET | Freq: Two times a day (BID) | ORAL | 3 refills | Status: DC

## 2020-08-24 NOTE — Telephone Encounter (Signed)
[name], Daughter of the patient called. The daughter sent a MyChart Message about a medication question. The Daughter wanted to know if there were any changes to his medications since his blood work that was done last Friday. She also wanted to know if she could read the question she sent Dr. Harrington Challenger about his Thyroid medication  The daughter does not have access to her computer to read the response

## 2020-08-24 NOTE — Telephone Encounter (Signed)
Patient's daughter notified.  See 08/24/20 phone note

## 2020-08-24 NOTE — Telephone Encounter (Signed)
[name], MD  08/21/2020 3:48 PM EST      Would recomm taking 2 torsemide 2x daily Do not bolus as now taking 3 in am 2x per wk Check BMET and BNP next Darel Hong, Carmin Muskrat, MD  Note THese are subtle increases in synthroid   I think, given that 75 mcg has normalized TSH probably increasing to 100 mcg per day is the right thing to do    F/U TSH in 2 months      I spoke with patient's daughter and gave her information from Dr Harrington Challenger.  Patient will come in for lab work on 08/30/20. Daughter requests TSH be checked in our office as patient comes in frequently for lab work.  Follow up TSH scheduled for 10/25/20

## 2020-08-28 ENCOUNTER — Encounter: Payer: Self-pay | Admitting: Internal Medicine

## 2020-08-28 ENCOUNTER — Other Ambulatory Visit: Payer: Self-pay | Admitting: Internal Medicine

## 2020-08-30 ENCOUNTER — Other Ambulatory Visit: Payer: Medicare Other | Admitting: *Deleted

## 2020-08-30 ENCOUNTER — Other Ambulatory Visit: Payer: Self-pay

## 2020-08-30 DIAGNOSIS — Z79899 Other long term (current) drug therapy: Secondary | ICD-10-CM | POA: Diagnosis not present

## 2020-08-30 DIAGNOSIS — N182 Chronic kidney disease, stage 2 (mild): Secondary | ICD-10-CM

## 2020-08-30 DIAGNOSIS — I5042 Chronic combined systolic (congestive) and diastolic (congestive) heart failure: Secondary | ICD-10-CM

## 2020-08-31 LAB — BASIC METABOLIC PANEL
BUN/Creatinine Ratio: 18 (ref 10–24)
BUN: 29 mg/dL — ABNORMAL HIGH (ref 8–27)
CO2: 26 mmol/L (ref 20–29)
Calcium: 9.2 mg/dL (ref 8.6–10.2)
Chloride: 98 mmol/L (ref 96–106)
Creatinine, Ser: 1.64 mg/dL — ABNORMAL HIGH (ref 0.76–1.27)
GFR calc Af Amer: 42 mL/min/{1.73_m2} — ABNORMAL LOW (ref >59)
GFR calc non Af Amer: 37 mL/min/{1.73_m2} — ABNORMAL LOW (ref >59)
Glucose: 94 mg/dL (ref 65–99)
Potassium: 4.3 mmol/L (ref 3.5–5.2)
Sodium: 138 mmol/L (ref 134–144)

## 2020-08-31 LAB — PRO B NATRIURETIC PEPTIDE: NT-Pro BNP: 1384 pg/mL — ABNORMAL HIGH (ref 0–486)

## 2020-09-05 ENCOUNTER — Other Ambulatory Visit: Payer: Self-pay | Admitting: *Deleted

## 2020-09-05 DIAGNOSIS — I5042 Chronic combined systolic (congestive) and diastolic (congestive) heart failure: Secondary | ICD-10-CM

## 2020-09-05 MED ORDER — METOLAZONE 2.5 MG PO TABS: 2.5000 mg | ORAL_TABLET | Freq: Every day | ORAL | 3 refills | Status: DC

## 2020-09-11 MED ORDER — TORSEMIDE 20 MG PO TABS: ORAL_TABLET | ORAL | 3 refills | Status: DC

## 2020-09-11 NOTE — Telephone Encounter (Signed)
Please see note from daughter    I told her she could try 3 torsemide AM and 2 in PM

## 2020-09-11 NOTE — Telephone Encounter (Addendum)
Medication list updated. Per Dr. Alan Ripper recommendations in this MyChart reply, pt to stop metolazone.  Increase torsemide to 60 mg every Am and 40 mg every Pm.  He is scheduled for repeat labs on 09/25/20.

## 2020-09-13 ENCOUNTER — Other Ambulatory Visit: Payer: Self-pay | Admitting: Internal Medicine

## 2020-09-14 MED ORDER — AMIODARONE HCL 100 MG PO TABS: 100.0000 mg | ORAL_TABLET | Freq: Every day | ORAL | 11 refills | Status: DC

## 2020-09-25 ENCOUNTER — Other Ambulatory Visit: Payer: Self-pay

## 2020-09-25 ENCOUNTER — Other Ambulatory Visit: Payer: Medicare Other | Admitting: *Deleted

## 2020-09-25 DIAGNOSIS — I5042 Chronic combined systolic (congestive) and diastolic (congestive) heart failure: Secondary | ICD-10-CM

## 2020-09-26 LAB — BASIC METABOLIC PANEL
BUN/Creatinine Ratio: 16 (ref 10–24)
BUN: 21 mg/dL (ref 8–27)
CO2: 25 mmol/L (ref 20–29)
Calcium: 8.6 mg/dL (ref 8.6–10.2)
Chloride: 100 mmol/L (ref 96–106)
Creatinine, Ser: 1.3 mg/dL — ABNORMAL HIGH (ref 0.76–1.27)
GFR calc Af Amer: 56 mL/min/{1.73_m2} — ABNORMAL LOW (ref >59)
GFR calc non Af Amer: 48 mL/min/{1.73_m2} — ABNORMAL LOW (ref >59)
Glucose: 89 mg/dL (ref 65–99)
Potassium: 4.2 mmol/L (ref 3.5–5.2)
Sodium: 140 mmol/L (ref 134–144)

## 2020-09-26 LAB — PRO B NATRIURETIC PEPTIDE: NT-Pro BNP: 640 pg/mL — ABNORMAL HIGH (ref 0–486)

## 2020-10-11 ENCOUNTER — Ambulatory Visit (INDEPENDENT_AMBULATORY_CARE_PROVIDER_SITE_OTHER): Payer: Medicare Other

## 2020-10-11 DIAGNOSIS — I5042 Chronic combined systolic (congestive) and diastolic (congestive) heart failure: Secondary | ICD-10-CM | POA: Diagnosis not present

## 2020-10-12 LAB — CUP PACEART REMOTE DEVICE CHECK
Battery Remaining Longevity: 109 mo
Battery Remaining Percentage: 95.5 %
Battery Voltage: 2.98 V
Brady Statistic AP VP Percent: 97 %
Brady Statistic AP VS Percent: 1 %
Brady Statistic AS VP Percent: 2.8 %
Brady Statistic AS VS Percent: 1 %
Brady Statistic RA Percent Paced: 32 %
Brady Statistic RV Percent Paced: 99 %
Date Time Interrogation Session: 20220210104024
Implantable Lead Implant Date: 20170202
Implantable Lead Implant Date: 20170202
Implantable Lead Location: 753859
Implantable Lead Location: 753860
Implantable Pulse Generator Implant Date: 20170202
Lead Channel Impedance Value: 360 Ohm
Lead Channel Impedance Value: 400 Ohm
Lead Channel Pacing Threshold Amplitude: 1 V
Lead Channel Pacing Threshold Amplitude: 1.25 V
Lead Channel Pacing Threshold Pulse Width: 0.4 ms
Lead Channel Pacing Threshold Pulse Width: 0.4 ms
Lead Channel Sensing Intrinsic Amplitude: 0.6 mV
Lead Channel Sensing Intrinsic Amplitude: 8.5 mV
Lead Channel Setting Pacing Amplitude: 1.25 V
Lead Channel Setting Pacing Amplitude: 2.5 V
Lead Channel Setting Pacing Pulse Width: 0.4 ms
Lead Channel Setting Sensing Sensitivity: 2 mV
Pulse Gen Model: 2240
Pulse Gen Serial Number: 7864186

## 2020-10-16 NOTE — Progress Notes (Signed)
Remote pacemaker transmission.   

## 2020-10-25 ENCOUNTER — Other Ambulatory Visit: Payer: Self-pay

## 2020-10-25 ENCOUNTER — Other Ambulatory Visit: Payer: Medicare Other | Admitting: *Deleted

## 2020-10-25 DIAGNOSIS — Z79899 Other long term (current) drug therapy: Secondary | ICD-10-CM

## 2020-10-25 DIAGNOSIS — E039 Hypothyroidism, unspecified: Secondary | ICD-10-CM

## 2020-10-26 LAB — TSH: TSH: 6.81 u[IU]/mL — ABNORMAL HIGH (ref 0.450–4.500)

## 2020-10-30 NOTE — Telephone Encounter (Signed)
Pt on torsemide 60 mg every am, 40 mg every pm.  Does not tolerate metolazone.  Weight increased to 300.9, usually weighing 295-296.  She reports he is feels weak and SOB.  I called but got VM.Marland Kitchen   Reached his daughter, (DPR) on mobile number.  She said he stayed at home this weekend and probably is out with his friend Tamela Oddi.  She cooks for him or they eat out a lot.   I adv that he increase afternoon dose of torsemide to 60 mg for 3 days and let us know at end of week how breathing and his weights are.  She will inform the patient of this.  If Dr. Harrington Challenger has any other recommendations, I will call her back.

## 2020-11-19 NOTE — Progress Notes (Unsigned)
Cardiology Office Note:    Date:  11/21/2020   ID:  Antwine, Agosto 02/20/1929, MRN [id number]  PCP:  Bernerd Limbo, MD  Cardiologist:  [name], MD  Electrophysiologist:  Cristopher Peru, MD   Referring MD: Bernerd Limbo, MD   Chief Complaint:  Pt presents for f/u of CHF   Patient Profile:    Levi [name] is a 87 y.o. male with:   Coronary artery disease  ? S/p CABG in 2011  ? S/p DES x 2 to LAD in 2013 ? LHC [facility] in 6/18: patent LIMA-LAD, diff dz elsewhere.All VGs occluded. No targets for PCI>>medical Rx recommended   Parox AFib ? CHADS2-VASc=5 (age x 2, HTN, CAD, CHF) >> Apixaban 5 mg  ? Amiodarone Rx started in 10/2019 due to increased AF burden  Symptomatic bradycardia, Mobitz 2 ? S/p Pacemaker Jan 8182  Combined systolic and diastolic CHF ? Echocardiogram 12/19: EF 40-45 (previously normal) >> Med Rx ? Admx 10/2018 w/ DHF ? Decomp HF 12/2018 managed as OP with Metolazone ? Admx with Decomp CHF 03/2019  Hypertension   Hyperlipidemia   Chronic kidney disease stage 2   OSA on CPAP  Morbid obesity  Prior CV studies: Echocardiogram 03/15/20 EF 45-50, inf and apical HK, mild conc LVH, Gr 2 DD, normal RVSF, trivial MR, mild to mod TR   Echo 08/28/18 Mild conc LVH, EF 40-45, inf/inf-sept/apical HK, Gr 2 DD, MAC, normal RVSF, mod RAE, increased RA pressure, mild TR  Cardiac catheterization 02/10/2017 ([facility]) Coronary angiography reveals 3-vessel CAD.  LM is diffusely diseased with 50% distal stenosis.  LAD has a 80% mid instent restenosis,  LCX has serial 50-60% stenoses in the mid segment, OM2 is a small vessel and has a 70% ostial stenosis.  RCA has long diffuse 50-60% proximal and 60% distal stenosis.  LIMA to LAD is patent with a kink in the mid segment. Flow is normal.  LV-Gram reveals EF 50%. LVEDP is 8 mmHg.  SVG grafts are known to be chronically occluded, so not injected   Nuclear stress test  01/21/2017 ([facility]) 1. Moderate to severe inferoapical inducible ischemia with regadenoson 2. Mild to moderate mid inferior and posterior basal defect. Cannot rule out prior infarct. 3. Mild to moderate hypokinesis of the inferior wall. Dyskinesia/akinesia of the apex. 4. Moderately decreased left ventricular ejection fraction at 43%. 5. Please correlate with a cardiac ultrasound   Carotid US 09/14/2015 Bilateral ICA 1-39; partial left subclavian steal>>follow-up as needed   Echo (10/15):  Mild LVH, EF 55-60%, no RWMA, mod LAE, mild TR, PASP 42 mmHg  Nuclear (4/15):  Low risk stress nuclear study with a fixed defect in the apical anterior, inferior walls and in the true apex consistent with distal LAD scar. No ischemia. EF 56%  LHC (12/13):  Dist LM 60-70%, ostial LAD 40-50%, D1 occluded, prox LAD 80% then aneurysmal segment then 80%, ostial septal perforator 99%, prox CFX 50%, OM branch serial 60% lesions, prox RCA 30%, PL Br 50%; LIMA-LAD patent, SVG-LAD occluded, SVG-OM occluded, SVG-PDA occluded >>Med Rx >>continued angina >> PCI (2/14): 2.25 x 32 mm Promus DES and 2.5 x 20 mm Promus DES to prox and mid LAD  History of Present Illness:    I last saw Mr Suder in clinic in Dec 2021   Since seen he still has LE edema  He also says he is tired  Says he \gets winded easily  Drinks quite  a bit of water  during day  Says his mouth is dry   Drinks about 5 to 6 big glasses   Friend who is with him says she doesn't add salt to prepped food    Looks at some labels but not all  Sleeps in reclner    Past Medical History:  Diagnosis Date  . Asthma   . Benign neoplasm of colon   . CAD (coronary artery disease) Dec 2011   s/p CABG per Dr. Roxy Manns; had normal EF; All SVGs occluded per follow up cath with only LIMA to LAD patent; s/p PCI of the proximal and mid LAD February 2014 per Dr. Burt Knack  . Chronic combined systolic and diastolic CHF (congestive  heart failure) ([place])   . CKD (chronic kidney disease), stage II   . Emphysema    "never treated for it" (10/05/2015)  . History of hiatal hernia   . HLD (hyperlipidemia)   . HTN (hypertension)   . Malaise and fatigue   . Obesity   . Osteopenia   . PAF (paroxysmal atrial fibrillation) ([place])   . Pneumonia 1960s X 1; 1990s X 1  . Presence of permanent cardiac pacemaker   . Prostate cancer ([place])   . Skin cancer    "face, nose, top of ears, scalp"  . Symptomatic bradycardia    STJ PPM, 10/05/15, Dr. Lovena Le    Current Medications: Current Meds  Medication Sig  . amiodarone (PACERONE) 100 MG tablet Take 1 tablet (100 mg total) by mouth daily.  Marland Kitchen apixaban (ELIQUIS) 2.5 MG TABS tablet Take 1 tablet (2.5 mg total) by mouth 2 (two) times daily.  . Artificial Tear Ointment (DRY EYES OP) Place 1 drop into both eyes 3 (three) times daily as needed (for dryness).  . cetirizine (ZYRTEC) 10 MG tablet Take 10 mg by mouth daily as needed for allergies or rhinitis.   Marland Kitchen levothyroxine (SYNTHROID) 75 MCG tablet Take 1 tablet (75 mcg total) by mouth daily before breakfast.  . nitroGLYCERIN (NITROSTAT) 0.4 MG SL tablet Place 1 tablet (0.4 mg total) under the tongue every 5 (five) minutes x 3 doses as needed for chest pain.  . pantoprazole (PROTONIX) 40 MG tablet Take 1 tablet (40 mg total) by mouth at bedtime.  . polyethylene glycol (MIRALAX / GLYCOLAX) 17 g packet Take 17 g by mouth daily.   . potassium chloride SA (KLOR-CON) 20 MEQ tablet Take 1 tablet (20 mEq total) by mouth 2 (two) times daily.  . pravastatin (PRAVACHOL) 20 MG tablet TAKE 1 TABLET BY MOUTH  DAILY  . tamsulosin (FLOMAX) 0.4 MG CAPS capsule Take 0.4 mg by mouth at bedtime.   . torsemide (DEMADEX) 20 MG tablet Take 3 tablets (60 mg) every AM.  Take 2 tablets (40 mg) every PM     Allergies:   Patient has no known allergies.   Social History   Tobacco Use  . Smoking status: Former Smoker    Packs/day: 1.00    Years: 30.00    Pack  years: 30.00    Types: Cigarettes    Quit date: 09/07/1975    Years since quitting: 45.2  . Smokeless tobacco: Former Systems developer    Types: [place] date: 07/27/2015  Vaping Use  . Vaping Use: Never used  Substance Use Topics  . Alcohol use: No    Alcohol/week: 0.0 standard drinks  . Drug use: No     Family Hx: The patient's family history includes Heart attack in  his father; Heart disease in his brother; Hypertension in his mother; Kidney cancer in his father; Prostate cancer in his brother; Skin cancer in his brother; Stroke in his mother. There is no history of Allergic rhinitis, Asthma, Eczema, or Urticaria.  ROS  See HPI  EKGs/Labs/Other Test Reviewed:    EKG:  EKG is ordered today.  V paced 70   Recent Labs: 03/14/2020: B Natriuretic Peptide 57.7; Magnesium 2.2 06/26/2020: ALT 12 08/04/2020: Hemoglobin 13.0; Platelets 199 09/25/2020: BUN 21; Creatinine, Ser 1.30; NT-Pro BNP 640; Potassium 4.2; Sodium 140 10/25/2020: TSH 6.810   Recent Lipid Panel Lab Results  Component Value Date/Time   CHOL 134 06/11/2019 04:38 PM   TRIG 254 (H) 06/11/2019 04:38 PM   HDL 31 (L) 06/11/2019 04:38 PM   CHOLHDL 4.3 06/11/2019 04:38 PM   CHOLHDL 3 06/01/2014 09:30 AM   LDLCALC 62 06/11/2019 04:38 PM   LDLDIRECT 70.7 06/10/2013 01:44 PM    Physical Exam:    VS:  BP 126/60   Pulse 70   Ht _0  (1.753 m)   Wt (!) 307 lb (139.3 kg)   SpO2 94%   BMI 45.34 kg/m     Wt Readings from Last 3 Encounters:  11/21/20 (!) 307 lb (139.3 kg)  08/04/20 299 lb 3.2 oz (135.7 kg)  05/01/20 (!) 304 lb (137.9 kg)    Morbidly obese 87 yo in NAD NEck is full  JVP is increased   Lungs Mild rales L base   Cardiac RRR  NoS3  No signif murmurs Abd is distended  Ext Lower extremies with 1+ edema R greater than L    ASSESSMENT & PLAN:    1. Chronic combined systolic and diastolic CHF (congestive heart failure) (HCC) Ischemic CM NYHA Class III EF 45-50 with moderate diastolic dysfunction by  echocardiogram in July 2021.    Volume continues to be up today Dicussed fluid   I recomm he cut back some   Recomm readking ALL food  Labels for hidden salt  Will check labs today (CBC, CMET, TSH, BNP)  Adjust meds based on this   2. Coronary artery disease involving native coronary artery of native heart with angina pectoris [facility]) History of CABG in 2011 and drug-eluting stent x2 to the LAD in 2013. [facility] and diffuse disease elsewhere. All vein grafts were known to be occluded. There were no targets for PCI.  Pt is SOB but no CP  Follow   Continue meds    3. PAF (paroxysmal atrial fibrillation) (HCC)  On Eliquis and amiodarone   Remains paced     WIll check labs  Today    4. Hypothyroidism, unspecified type  WIll check TSH   6. CKD (chronic kidney disease) stage 2, GFR 60-89 ml/min Check labs today    7. Cardiac pacemaker in situ Follows with EP    Plan for f/u this summer  Medication Adjustments/Labs and Tests Ordered: Current medicines are reviewed at length with the patient today.  Concerns regarding medicines are outlined above.  Tests Ordered: Orders Placed This Encounter  Procedures  . CBC  . Comprehensive metabolic panel  . Pro b natriuretic peptide (BNP)  . Lipid panel  . TSH  . EKG 12-Lead   Medication Changes: No orders of the defined types were placed in this encounter.   Signed, [name], MD  11/21/2020 12:46 PM    [place],  [place]  [address] Phone: [phone]; Fax: [phone]

## 2020-11-21 ENCOUNTER — Encounter: Payer: Self-pay | Admitting: Internal Medicine

## 2020-11-21 ENCOUNTER — Other Ambulatory Visit: Payer: Self-pay

## 2020-11-21 ENCOUNTER — Ambulatory Visit (INDEPENDENT_AMBULATORY_CARE_PROVIDER_SITE_OTHER): Payer: Medicare Other | Admitting: Internal Medicine

## 2020-11-21 VITALS — BP 126/60 | HR 70 | Ht 69.0 in | Wt 307.0 lb

## 2020-11-21 DIAGNOSIS — I10 Essential (primary) hypertension: Secondary | ICD-10-CM

## 2020-11-21 DIAGNOSIS — I5042 Chronic combined systolic (congestive) and diastolic (congestive) heart failure: Secondary | ICD-10-CM

## 2020-11-21 DIAGNOSIS — E785 Hyperlipidemia, unspecified: Secondary | ICD-10-CM | POA: Diagnosis not present

## 2020-11-21 DIAGNOSIS — I480 Paroxysmal atrial fibrillation: Secondary | ICD-10-CM | POA: Diagnosis not present

## 2020-11-21 DIAGNOSIS — Z79899 Other long term (current) drug therapy: Secondary | ICD-10-CM

## 2020-11-21 DIAGNOSIS — R0602 Shortness of breath: Secondary | ICD-10-CM

## 2020-11-21 NOTE — Patient Instructions (Addendum)
Medication Instructions:  The current medical regimen is effective;  continue present plan and medications.  *If you need a refill on your cardiac medications before your next appointment, please call your pharmacy*  Lab Work: Please have blood work today (CBC,CMP,TSH, pro-BNP, Lipid) If you have labs (blood work) drawn today and your tests are completely normal, you will receive your results only by: Marland Kitchen MyChart Message (if you have MyChart) OR . A paper copy in the mail If you have any lab test that is abnormal or we need to change your treatment, we will call you to review the results.  Follow-Up: At [facility], you and your health needs are our priority.  As part of our continuing mission to provide you with exceptional heart care, we have created designated Provider Care Teams.  These Care Teams include your primary Cardiologist (physician) and Advanced Practice Providers (APPs -  Physician Assistants and Nurse Practitioners) who all work together to provide you with the care you need, when you need it.  We recommend signing up for the patient portal called "MyChart".  Sign up information is provided on this After Visit Summary.  MyChart is used to connect with patients for Virtual Visits (Telemedicine).  Patients are able to view lab/test results, encounter notes, upcoming appointments, etc.  Non-urgent messages can be sent to your provider as well.   To learn more about what you can do with MyChart, go to [email or web address].    Your next appointment:   3 month(s)  (will be called with appt)  The format for your next appointment:   In Person  Provider:   [name], MD  Thank you for choosing [facility]!!

## 2020-11-22 ENCOUNTER — Telehealth: Payer: Self-pay | Admitting: *Deleted

## 2020-11-22 DIAGNOSIS — I5042 Chronic combined systolic (congestive) and diastolic (congestive) heart failure: Secondary | ICD-10-CM

## 2020-11-22 LAB — COMPREHENSIVE METABOLIC PANEL
ALT: 11 IU/L (ref 0–44)
AST: 14 IU/L (ref 0–40)
Albumin/Globulin Ratio: 1.3 (ref 1.2–2.2)
Albumin: 3.9 g/dL (ref 3.6–4.6)
Alkaline Phosphatase: 133 IU/L — ABNORMAL HIGH (ref 44–121)
BUN/Creatinine Ratio: 16 (ref 10–24)
BUN: 26 mg/dL (ref 8–27)
Bilirubin Total: 0.5 mg/dL (ref 0.0–1.2)
CO2: 26 mmol/L (ref 20–29)
Calcium: 8.7 mg/dL (ref 8.6–10.2)
Chloride: 99 mmol/L (ref 96–106)
Creatinine, Ser: 1.65 mg/dL — ABNORMAL HIGH (ref 0.76–1.27)
Globulin, Total: 2.9 g/dL (ref 1.5–4.5)
Glucose: 119 mg/dL — ABNORMAL HIGH (ref 65–99)
Potassium: 4.1 mmol/L (ref 3.5–5.2)
Sodium: 139 mmol/L (ref 134–144)
Total Protein: 6.8 g/dL (ref 6.0–8.5)
eGFR: 39 mL/min/{1.73_m2} — ABNORMAL LOW (ref >59)

## 2020-11-22 LAB — LIPID PANEL
Chol/HDL Ratio: 2.7 ratio (ref 0.0–5.0)
Cholesterol, Total: 101 mg/dL (ref 100–199)
HDL: 37 mg/dL — ABNORMAL LOW (ref >39)
LDL Chol Calc (NIH): 46 mg/dL (ref 0–99)
Triglycerides: 89 mg/dL (ref 0–149)
VLDL Cholesterol Cal: 18 mg/dL (ref 5–40)

## 2020-11-22 LAB — CBC
Hematocrit: 36.8 % — ABNORMAL LOW (ref 37.5–51.0)
Hemoglobin: 12.1 g/dL — ABNORMAL LOW (ref 13.0–17.7)
MCH: 30.6 pg (ref 26.6–33.0)
MCHC: 32.9 g/dL (ref 31.5–35.7)
MCV: 93 fL (ref 79–97)
Platelets: 183 10*3/uL (ref 150–450)
RBC: 3.95 x10E6/uL — ABNORMAL LOW (ref 4.14–5.80)
RDW: 13.6 % (ref 11.6–15.4)
WBC: 6.3 10*3/uL (ref 3.4–10.8)

## 2020-11-22 LAB — TSH: TSH: 7.86 u[IU]/mL — ABNORMAL HIGH (ref 0.450–4.500)

## 2020-11-22 LAB — PRO B NATRIURETIC PEPTIDE: NT-Pro BNP: 1647 pg/mL — ABNORMAL HIGH (ref 0–486)

## 2020-11-22 NOTE — Telephone Encounter (Signed)
-----   Message from [name], MD sent at 11/22/2020  8:54 AM EDT ----- CBC is OK LIpids are very good  Electrolytes are OK  Liver function OK Kidney function:  Cr is 1.65  (ranges 1.3 to 1.6 in past)   I do not want to change diuretic    We discussed fuld and salt His fluid is up   Need to watch excess fluid/water and salt Thyroid is off some   I would recomm increasing synthroid to 100 mcg daily   Check TSH and BMET/BNP in 6 weeks

## 2020-11-22 NOTE — Telephone Encounter (Signed)
DPR ok to s/w pt's daughter [name]. [name] has been made aware of lab results by phone with verbal understanding. When I advised Dr. Harrington Challenger is increasing Synthroid to 100 mcg [name] states the PCP already did this on 08/12/20, she states Dr. Harrington Challenger was made aware of this at that time. I assured the pt I will send message to [facility], RN for Dr. Harrington Challenger to d/w MD as to Synthroid dose. I stated that we showed his med list reflects Levothyroxine 75 mcg. [name] and I did go ahead and schedule lab appt 01/03/21. I will place lab orders. Again, stated that I will have Michalene call her back when she is available. [name] thanked me for the call and the help. Patient notified of result.  Please refer to phone note from today for complete details.   Julaine Hua, [facility] 11/22/2020 9:43 AM   Did advise as well to watch Na intake and water intake. Important to not drink excessive amounts of water. Advised water intake 50-60 oz, no more than 60 oz daily.

## 2020-11-24 ENCOUNTER — Other Ambulatory Visit: Payer: Self-pay | Admitting: *Deleted

## 2020-11-24 MED ORDER — LEVOTHYROXINE SODIUM 112 MCG PO TABS: 112.0000 ug | ORAL_TABLET | Freq: Every day | ORAL | 3 refills | Status: DC

## 2020-11-24 NOTE — Addendum Note (Signed)
Addended by: Rodman Key on: 11/24/2020 05:21 PM   Modules accepted: Orders

## 2020-11-24 NOTE — Telephone Encounter (Signed)
Reviewed with Dr. Harrington Challenger. She was not aware that Dr. Coletta Memos had increased dose of levothyroxine to 100 mcg in December.  Per Dr. Harrington Challenger pt should now increase to 112 mcg daily.  I have replied to patient's (daughter's) Mychart message to let her know this information and that a prescription for the new dose has been sent to [facility].  Medication list updated.

## 2020-12-05 IMAGING — DX DG CHEST 2V
3 series · 3 of 3 positions shown · non-contrast
Comparison: 08/06/2017

CLINICAL DATA: CHF

EXAM:
CHEST - 2 VIEW

[chest lat (1 of 2)]
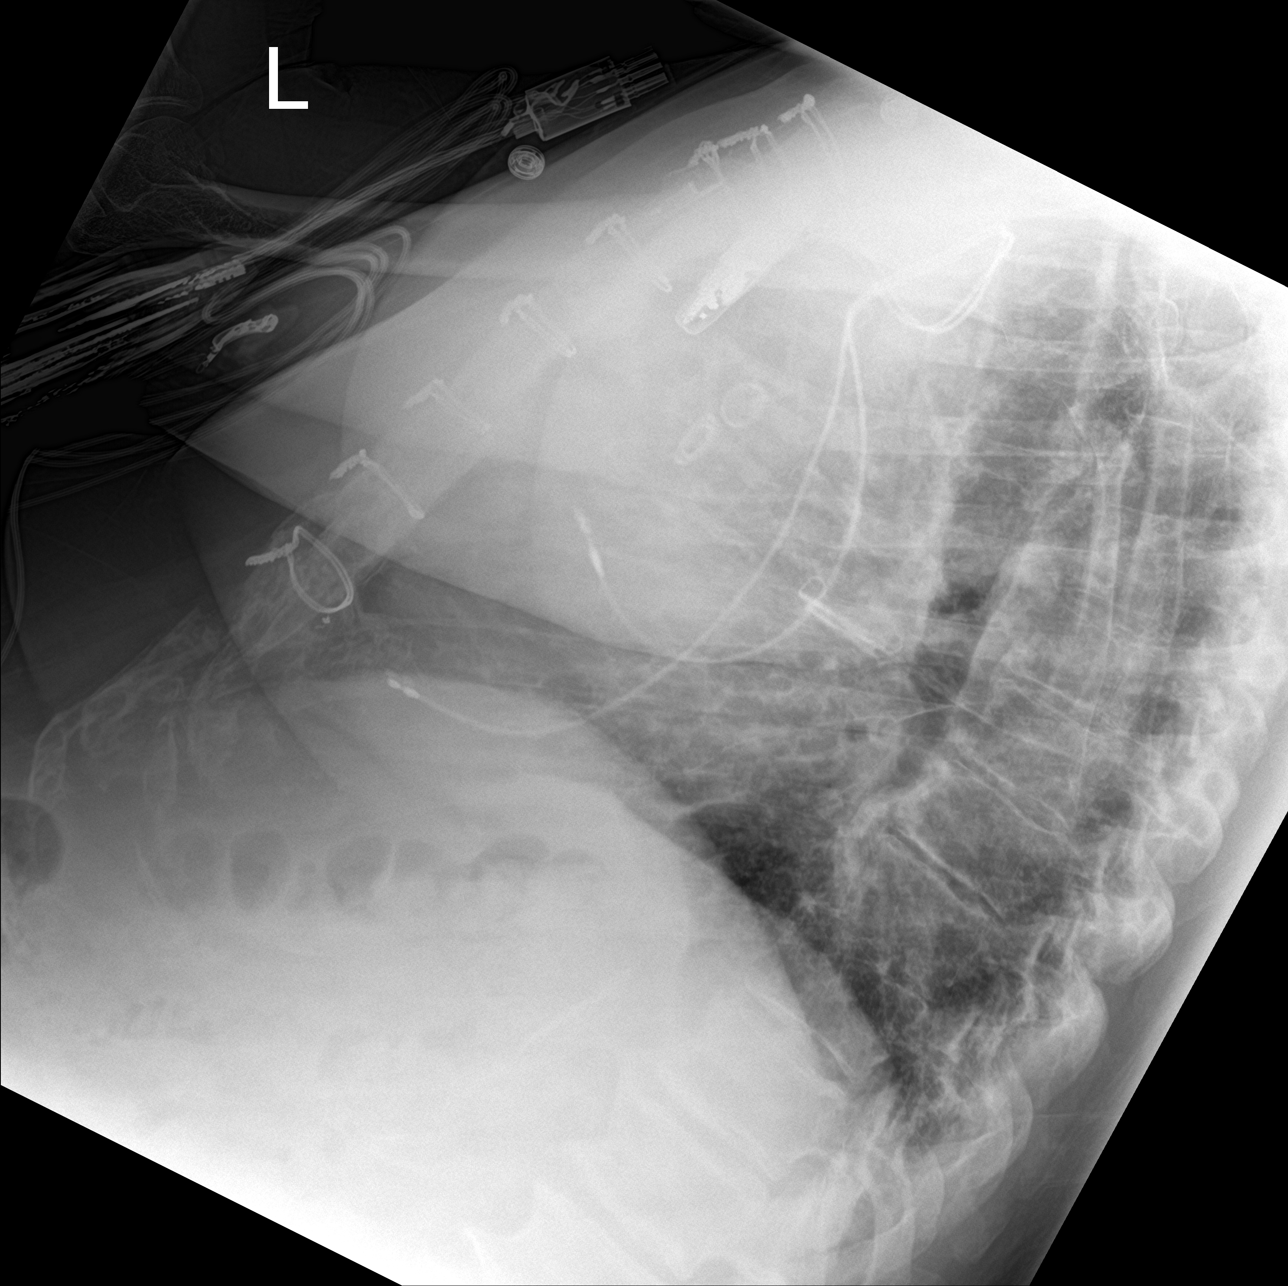

[chest ap]
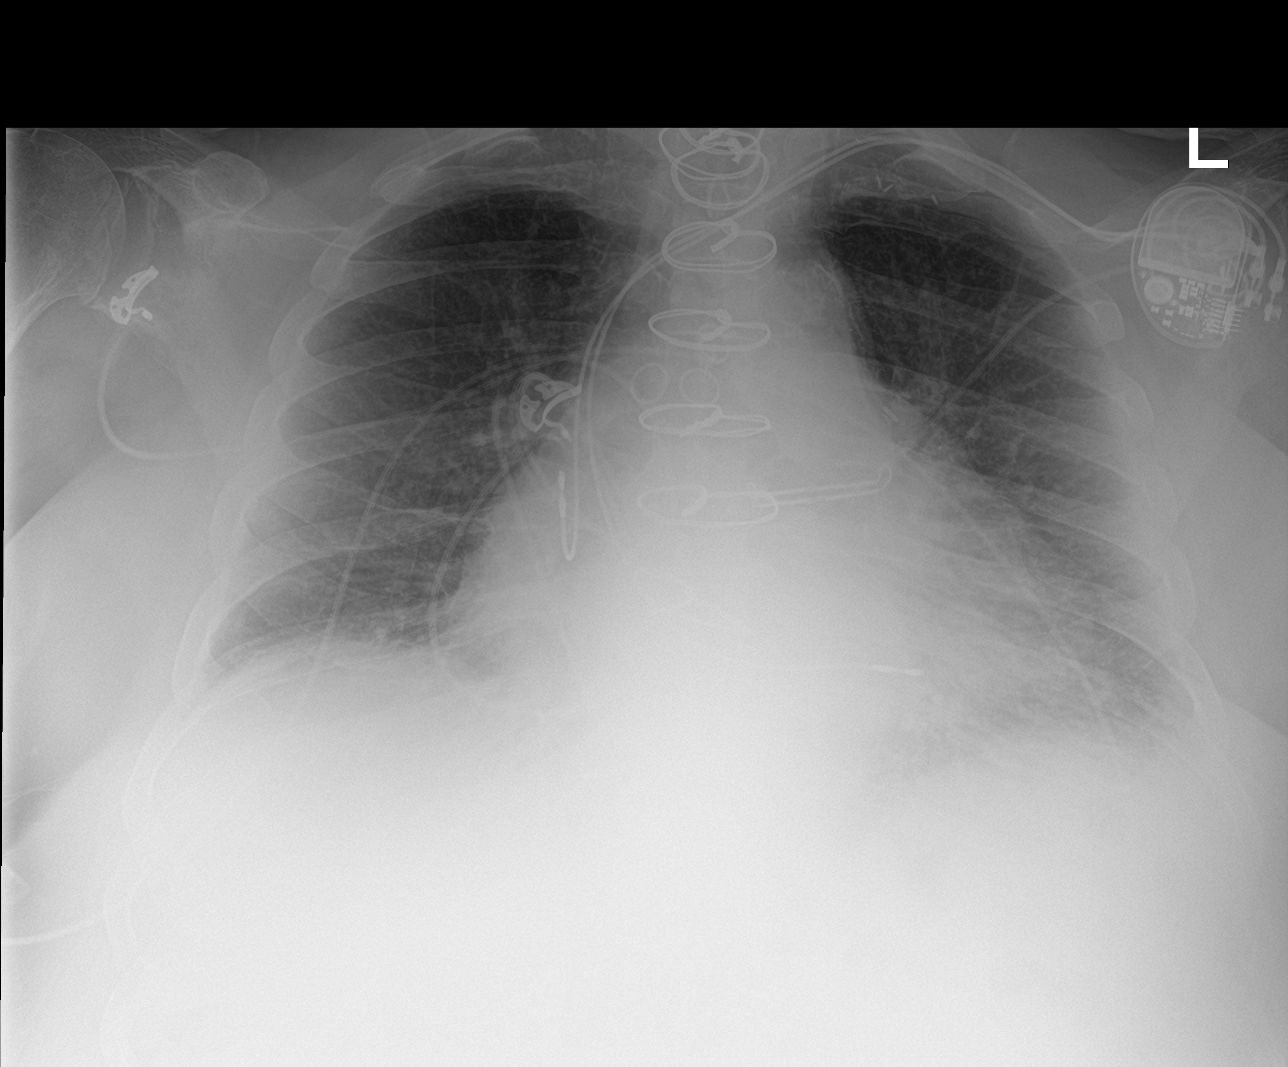

[chest lat (2 of 2)]
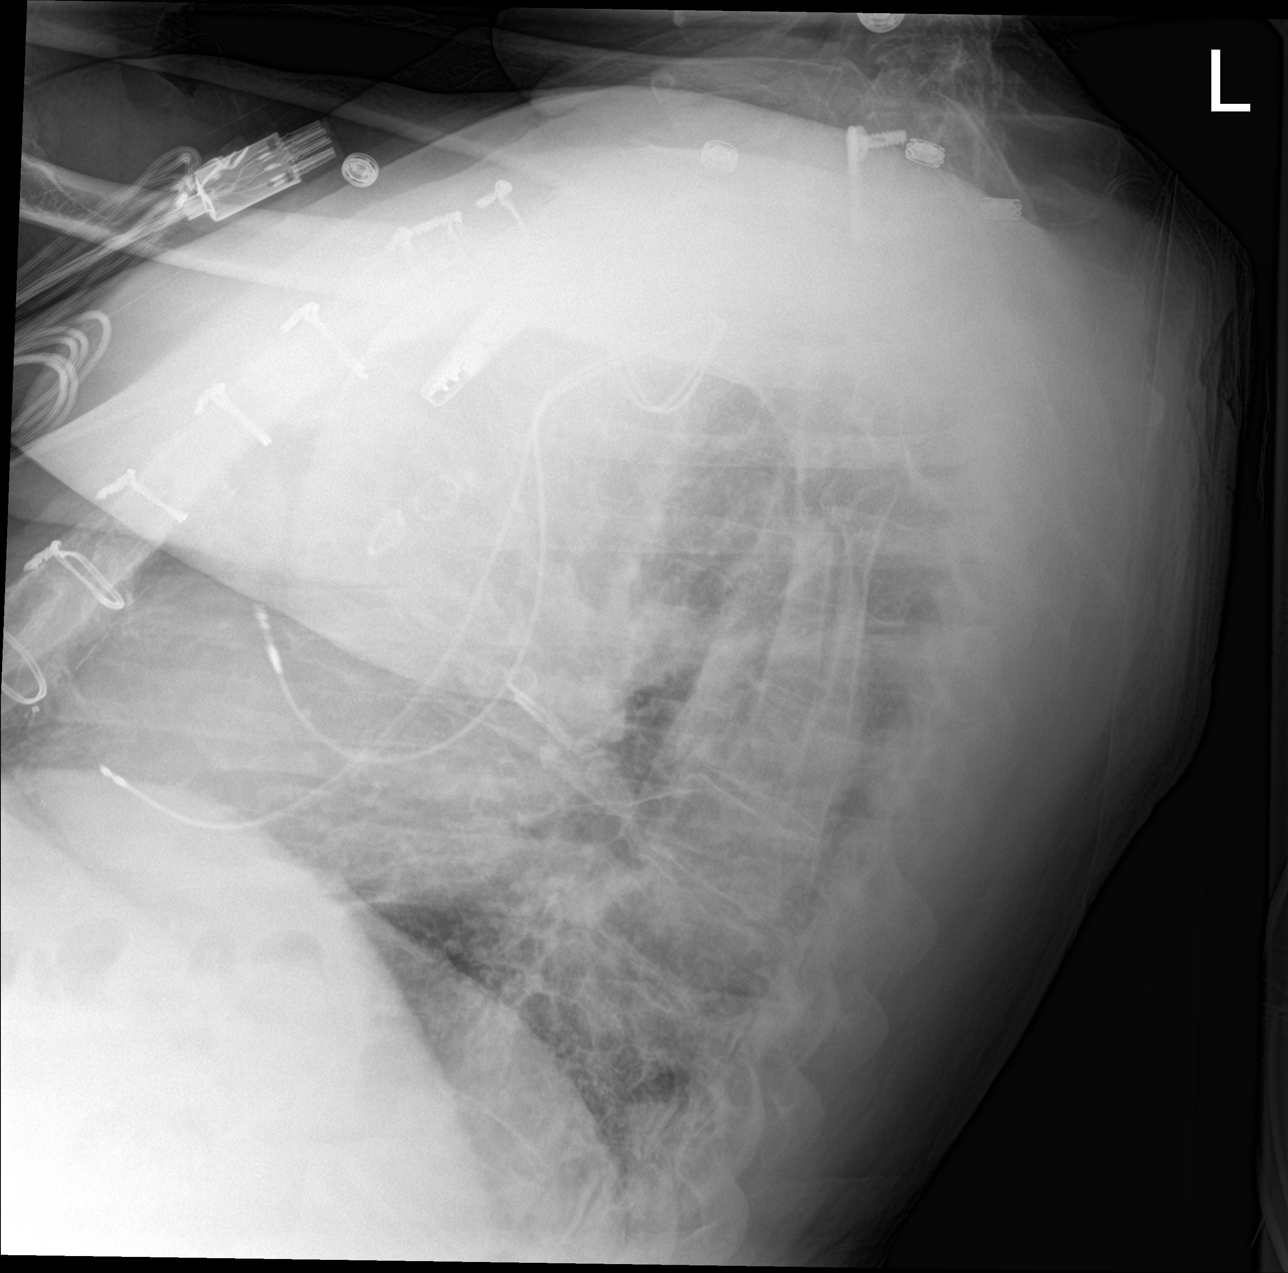

[3 of 3 positions shown; findings below may reference images not displayed]

FINDINGS: Cardiomegaly accentuated by lower volumes. Dual-chamber pacer
implant and CABG with left atrial clipping. Interstitial coarsening
similar to multiple priors. No definite effusion. No pneumothorax.
IMPRESSION: Mild interstitial coarsening presumably from mild edema given
history of CHF.

## 2020-12-18 NOTE — Telephone Encounter (Signed)
I called the patient.  Yesterday he was dragging.  Feeling better today than yesterday. He was very weak and SOB.   His weight this am 302.9.  In Feb he was fluctuating between 294-296.  I asked him to increase afternoon dose of  torsemide to 3 tablets for three days.  This was done Feb 28 and was effective.  No change in potassium dose of 20 meq BID.   Will route to Dr. Harrington Challenger to make aware and for any further recommendations.

## 2020-12-21 ENCOUNTER — Telehealth: Payer: Self-pay | Admitting: Cardiovascular Disease

## 2020-12-21 NOTE — Telephone Encounter (Signed)
Patient's daughter is requesting to speak with Dr. Camillia Herter nurse. She wanted to provide his weight for today, 300 lbs. She would like to know if he needs to adjust his fluid medication. Please advise.

## 2020-12-21 NOTE — Telephone Encounter (Signed)
Agree with plan 

## 2020-12-21 NOTE — Telephone Encounter (Signed)
Pt was previously instructed to increase torsemide to 3 tablets BID for 3 days (usual dose 3 tabs AM, 2 tabs PM).  His weight is down 2.9 lbs since Monday to 300.0 today per this message from his daughter.  See MyChart Encounter 4/17 for further details.   I called pt to see how he is feeling.  Left message for him to call back.   Will route to Dr. Harrington Challenger for recommendations.

## 2021-01-03 ENCOUNTER — Other Ambulatory Visit: Payer: Medicare Other | Admitting: *Deleted

## 2021-01-03 ENCOUNTER — Other Ambulatory Visit: Payer: Self-pay

## 2021-01-03 DIAGNOSIS — I5042 Chronic combined systolic (congestive) and diastolic (congestive) heart failure: Secondary | ICD-10-CM

## 2021-01-04 LAB — BASIC METABOLIC PANEL
BUN/Creatinine Ratio: 14 (ref 10–24)
BUN: 22 mg/dL (ref 8–27)
CO2: 23 mmol/L (ref 20–29)
Calcium: 8.4 mg/dL — ABNORMAL LOW (ref 8.6–10.2)
Chloride: 99 mmol/L (ref 96–106)
Creatinine, Ser: 1.59 mg/dL — ABNORMAL HIGH (ref 0.76–1.27)
Glucose: 98 mg/dL (ref 65–99)
Potassium: 3.8 mmol/L (ref 3.5–5.2)
Sodium: 139 mmol/L (ref 134–144)
eGFR: 41 mL/min/{1.73_m2} — ABNORMAL LOW (ref >59)

## 2021-01-04 LAB — TSH: TSH: 4.58 u[IU]/mL — ABNORMAL HIGH (ref 0.450–4.500)

## 2021-01-04 LAB — PRO B NATRIURETIC PEPTIDE: NT-Pro BNP: 2168 pg/mL — ABNORMAL HIGH (ref 0–486)

## 2021-01-10 ENCOUNTER — Ambulatory Visit (INDEPENDENT_AMBULATORY_CARE_PROVIDER_SITE_OTHER): Payer: Medicare Other

## 2021-01-10 DIAGNOSIS — I442 Atrioventricular block, complete: Secondary | ICD-10-CM | POA: Diagnosis not present

## 2021-01-10 DIAGNOSIS — I5042 Chronic combined systolic (congestive) and diastolic (congestive) heart failure: Secondary | ICD-10-CM

## 2021-01-10 LAB — CUP PACEART REMOTE DEVICE CHECK
Battery Remaining Longevity: 109 mo
Battery Remaining Percentage: 95.5 %
Battery Voltage: 2.98 V
Brady Statistic AP VP Percent: 97 %
Brady Statistic AP VS Percent: 1 %
Brady Statistic AS VP Percent: 2.9 %
Brady Statistic AS VS Percent: 1 %
Brady Statistic RA Percent Paced: 25 %
Brady Statistic RV Percent Paced: 99 %
Date Time Interrogation Session: 20220510193934
Implantable Lead Implant Date: 20170202
Implantable Lead Implant Date: 20170202
Implantable Lead Location: 753859
Implantable Lead Location: 753860
Implantable Pulse Generator Implant Date: 20170202
Lead Channel Impedance Value: 350 Ohm
Lead Channel Impedance Value: 400 Ohm
Lead Channel Pacing Threshold Amplitude: 0.875 V
Lead Channel Pacing Threshold Amplitude: 1.25 V
Lead Channel Pacing Threshold Pulse Width: 0.4 ms
Lead Channel Pacing Threshold Pulse Width: 0.4 ms
Lead Channel Sensing Intrinsic Amplitude: 0.6 mV
Lead Channel Sensing Intrinsic Amplitude: 8.5 mV
Lead Channel Setting Pacing Amplitude: 1.125
Lead Channel Setting Pacing Amplitude: 2.5 V
Lead Channel Setting Pacing Pulse Width: 0.4 ms
Lead Channel Setting Sensing Sensitivity: 2 mV
Pulse Gen Model: 2240
Pulse Gen Serial Number: 7864186

## 2021-01-10 NOTE — Telephone Encounter (Signed)
Would try 3 tabs bid alternating with 3/2       F/U BMET and BNP in 2 wks

## 2021-01-12 MED ORDER — TORSEMIDE 20 MG PO TABS
60.0000 mg | ORAL_TABLET | Freq: Two times a day (BID) | ORAL | 3 refills | Status: DC
Start: 2021-01-12 — End: 2021-05-15

## 2021-01-23 ENCOUNTER — Other Ambulatory Visit: Payer: Medicare Other | Admitting: *Deleted

## 2021-01-23 ENCOUNTER — Ambulatory Visit (INDEPENDENT_AMBULATORY_CARE_PROVIDER_SITE_OTHER): Payer: Medicare Other | Admitting: Internal Medicine

## 2021-01-23 ENCOUNTER — Other Ambulatory Visit: Payer: Self-pay

## 2021-01-23 ENCOUNTER — Encounter: Payer: Self-pay | Admitting: Internal Medicine

## 2021-01-23 VITALS — BP 126/70 | HR 69 | Ht 69.0 in | Wt 302.4 lb

## 2021-01-23 DIAGNOSIS — I5042 Chronic combined systolic (congestive) and diastolic (congestive) heart failure: Secondary | ICD-10-CM

## 2021-01-23 DIAGNOSIS — I10 Essential (primary) hypertension: Secondary | ICD-10-CM | POA: Diagnosis not present

## 2021-01-23 DIAGNOSIS — Z950 Presence of cardiac pacemaker: Secondary | ICD-10-CM

## 2021-01-23 DIAGNOSIS — Z951 Presence of aortocoronary bypass graft: Secondary | ICD-10-CM

## 2021-01-23 DIAGNOSIS — R001 Bradycardia, unspecified: Secondary | ICD-10-CM

## 2021-01-23 DIAGNOSIS — I480 Paroxysmal atrial fibrillation: Secondary | ICD-10-CM

## 2021-01-23 NOTE — Progress Notes (Signed)
HPI [name] returns today for followup. He is a morbidly obese 87 yo man with CAD, s/p CABG, HTN, PAF on low dose amiodarone, heart block, s/p PPM insertion. In the interim, he denies chest pain but has had progressive sob, fatigue and weakness. He is ok at rest but feels bad when he tries to do anything. He admits to dietary indiscretion. "I still like to eat." He was started on low dose amiodarone but has mostly been in atrial fib. He is up 5 lbs since his visit a year ago. No Known Allergies   Current Outpatient Medications  Medication Sig Dispense Refill  . amiodarone (PACERONE) 100 MG tablet Take 1 tablet (100 mg total) by mouth daily. 30 tablet 11  . apixaban (ELIQUIS) 2.5 MG TABS tablet Take 1 tablet (2.5 mg total) by mouth 2 (two) times daily. 180 tablet 1  . Artificial Tear Ointment (DRY EYES OP) Place 1 drop into both eyes 3 (three) times daily as needed (for dryness).    . cetirizine (ZYRTEC) 10 MG tablet Take 10 mg by mouth daily as needed for allergies or rhinitis.     Marland Kitchen levothyroxine (SYNTHROID) 112 MCG tablet Take 1 tablet (112 mcg total) by mouth daily before breakfast. 90 tablet 3  . nitroGLYCERIN (NITROSTAT) 0.4 MG SL tablet Place 1 tablet (0.4 mg total) under the tongue every 5 (five) minutes x 3 doses as needed for chest pain. 30 tablet 3  . pantoprazole (PROTONIX) 40 MG tablet Take 1 tablet (40 mg total) by mouth at bedtime. 30 tablet 11  . polyethylene glycol (MIRALAX / GLYCOLAX) 17 g packet Take 17 g by mouth daily.     . potassium chloride SA (KLOR-CON) 20 MEQ tablet Take 1 tablet (20 mEq total) by mouth 2 (two) times daily. 180 tablet 2  . pravastatin (PRAVACHOL) 20 MG tablet TAKE 1 TABLET BY MOUTH  DAILY 90 tablet 3  . tamsulosin (FLOMAX) 0.4 MG CAPS capsule Take 0.4 mg by mouth at bedtime.     . torsemide (DEMADEX) 20 MG tablet Take 3 tablets (60 mg total) by mouth 2 (two) times daily. 315 tablet 3   No current facility-administered medications for this  visit.     Past Medical History:  Diagnosis Date  . Asthma   . Benign neoplasm of colon   . CAD (coronary artery disease) Dec 2011   s/p CABG per Dr. Roxy Manns; had normal EF; All SVGs occluded per follow up cath with only LIMA to LAD patent; s/p PCI of the proximal and mid LAD February 2014 per Dr. Burt Knack  . Chronic combined systolic and diastolic CHF (congestive heart failure) ([place])   . CKD (chronic kidney disease), stage II   . Emphysema    "never treated for it" (10/05/2015)  . History of hiatal hernia   . HLD (hyperlipidemia)   . HTN (hypertension)   . Malaise and fatigue   . Obesity   . Osteopenia   . PAF (paroxysmal atrial fibrillation) ([place])   . Pneumonia 1960s X 1; 1990s X 1  . Presence of permanent cardiac pacemaker   . Prostate cancer ([place])   . Skin cancer    "face, nose, top of ears, scalp"  . Symptomatic bradycardia    STJ PPM, 10/05/15, Dr. Lovena Le    ROS:   All systems reviewed and negative except as noted in the HPI.   Past Surgical History:  Procedure Laterality Date  . ANTERIOR CERVICAL DECOMP/DISCECTOMY FUSION    .  BACK SURGERY    . C-EYE SURGERY PROCEDURE    . CATARACT EXTRACTION Right   . CORONARY ANGIOPLASTY WITH STENT PLACEMENT  10/14/12   with stent to proximal and mid LAD  . CORONARY ARTERY BYPASS GRAFT  08/23/10   "CABG X4"  . EP IMPLANTABLE DEVICE N/A 10/05/2015   Procedure: Pacemaker Implant;  Surgeon: [name], MD;  Location: [place] CV LAB;  Service: Cardiovascular;  Laterality: N/A;  . EP IMPLANTABLE DEVICE N/A 10/06/2015   Procedure: Lead Revision/Repair;  Surgeon: [name], MD;  Location: [place] CV LAB;  Service: Cardiovascular;  Laterality: N/A;  . EYE SURGERY    . INSERT / REPLACE / REMOVE PACEMAKER  10/05/2015  . LEFT HEART CATHETERIZATION WITH CORONARY/GRAFT ANGIOGRAM N/A 09/01/2012   Procedure: LEFT HEART CATHETERIZATION WITH [name];  Surgeon: [name], MD;  Location: [facility] CATH LAB;  Service:  Cardiovascular;  Laterality: N/A;  . MEDIAN STERNOTOMY  08/23/10  . MOHS SURGERY  "@ least twice"  . PACEMAKER INSERTION     STJ 10/05/15  . PERCUTANEOUS CORONARY STENT INTERVENTION (PCI-S) N/A 10/14/2012   Procedure: PERCUTANEOUS CORONARY STENT INTERVENTION (PCI-S);  Surgeon: [name], MD;  Location: [facility] CATH LAB;  Service: Cardiovascular;  Laterality: N/A;  . PROSTATE BIOPSY    . TRANSURETHRAL RESECTION OF PROSTATE       Family History  Problem Relation Age of Onset  . Kidney cancer Father   . Heart attack Father   . Hypertension Mother   . Stroke Mother   . Heart disease Brother   . Skin cancer Brother   . Prostate cancer Brother   . Allergic rhinitis Neg Hx   . Asthma Neg Hx   . Eczema Neg Hx   . Urticaria Neg Hx      Social History   Socioeconomic History  . Marital status: Widowed    Spouse name: Not on file  . Number of children: Not on file  . Years of education: Not on file  . Highest education level: Not on file  Occupational History  . Occupation: retired Information systems manager: RETIRED  Tobacco Use  . Smoking status: Former Smoker    Packs/day: 1.00    Years: 30.00    Pack years: 30.00    Types: Cigarettes    Quit date: 09/07/1975    Years since quitting: 45.4  . Smokeless tobacco: Former Systems developer    Types: [place] date: 07/27/2015  Vaping Use  . Vaping Use: Never used  Substance and Sexual Activity  . Alcohol use: No    Alcohol/week: 0.0 standard drinks  . Drug use: No  . Sexual activity: Not Currently  Other Topics Concern  . Not on file  Social History Narrative  . Not on file   Social Determinants of Health   Financial Resource Strain: Not on file  Food Insecurity: Not on file  Transportation Needs: Not on file  Physical Activity: Not on file  Stress: Not on file  Social Connections: Not on file  Intimate Partner Violence: Not on file     BP 126/70   Pulse 69   Ht 5\' 9"  (1.753 m)   Wt (!) 302 lb 6.4 oz (137.2 kg)   SpO2 97%    BMI 44.66 kg/m   Physical Exam:  Morbidly obese appearing NAD HEENT: Unremarkable Neck:  No JVD, no thyromegally Lymphatics:  No adenopathy Back:  No CVA tenderness Lungs:  Clear with  no wheezes HEART:  Regular rate rhythm, no murmurs, no rubs, no clicks Abd:  soft, positive bowel sounds, no organomegally, no rebound, no guarding Ext:  2 plus pulses, no edema, no cyanosis, no clubbing Skin:  No rashes no nodules Neuro:  CN II through XII intact, motor grossly intact   DEVICE  Normal device function.  See PaceArt for details.   Assess/Plan: 1. Atrial fib - at this point, with his morbid obesity and advanced age, I think a strategy of rate control is most appropriate.  2. Morbid obesity - I discussed fasting with the patient in detail.  3. Chronic systolic heart failure -his symptoms are class 3 but I strongly suspect that he is limited mostly by his weight. 4. CHB - he is dependent today. His [employer] Jude DDD PM is working normally.   Carleene Overlie [place],MD

## 2021-01-23 NOTE — Patient Instructions (Signed)
Medication Instructions:  Your physician recommends that you continue on your current medications as directed. Please refer to the Current Medication list given to you today.  Labwork: None ordered.  Testing/Procedures: None ordered.  Follow-Up: Your physician wants you to follow-up in: one year with Cristopher Peru, MD or one of the following Advanced Practice Providers on your designated Care Team:    Chanetta Marshall, NP  Tommye Standard, PA-C  Legrand Como "Jonni Sanger" [place], [place]  Remote monitoring is used to monitor your Pacemaker from home. This monitoring reduces the number of office visits required to check your device to one time per year. It allows [place] to keep an eye on the functioning of your device to ensure it is working properly. You are scheduled for a device check from home on 04/11/2021. You may send your transmission at any time that day. If you have a wireless device, the transmission will be sent automatically. After your physician reviews your transmission, you will receive a postcard with your next transmission date.  Any Other Special Instructions Will Be Listed Below (If Applicable).  If you need a refill on your cardiac medications before your next appointment, please call your pharmacy.

## 2021-01-24 ENCOUNTER — Other Ambulatory Visit: Payer: Medicare Other

## 2021-01-24 ENCOUNTER — Other Ambulatory Visit: Payer: Self-pay | Admitting: Internal Medicine

## 2021-01-24 DIAGNOSIS — I5042 Chronic combined systolic (congestive) and diastolic (congestive) heart failure: Secondary | ICD-10-CM

## 2021-01-24 LAB — BASIC METABOLIC PANEL
BUN/Creatinine Ratio: 15 (ref 10–24)
BUN: 25 mg/dL (ref 8–27)
CO2: 23 mmol/L (ref 20–29)
Calcium: 8.6 mg/dL (ref 8.6–10.2)
Chloride: 102 mmol/L (ref 96–106)
Creatinine, Ser: 1.64 mg/dL — ABNORMAL HIGH (ref 0.76–1.27)
Glucose: 103 mg/dL — ABNORMAL HIGH (ref 65–99)
Potassium: 4.1 mmol/L (ref 3.5–5.2)
Sodium: 143 mmol/L (ref 134–144)
eGFR: 40 mL/min/{1.73_m2} — ABNORMAL LOW (ref >59)

## 2021-01-24 LAB — PRO B NATRIURETIC PEPTIDE: NT-Pro BNP: 1939 pg/mL — ABNORMAL HIGH (ref 0–486)

## 2021-01-24 MED ORDER — AMIODARONE HCL 100 MG PO TABS: 100.0000 mg | ORAL_TABLET | Freq: Every day | ORAL | 11 refills | Status: DC

## 2021-01-26 MED ORDER — EMPAGLIFLOZIN 10 MG PO TABS: 10.0000 mg | ORAL_TABLET | Freq: Every day | ORAL | 3 refills | Status: DC

## 2021-01-26 NOTE — Telephone Encounter (Signed)
I saw the labs from earlier this week   Sent a msg via [place] but it does not appear I did it correctly His fluid remains elevated His Cr is stable but not normal    I don't think pushing with more duretics would help Could try Jardiance 10 mg but will need to follow kidney function, electrolytes very closely   Watch for UTIs very closely Mechanism of action for fluid is different that torsemide   May help    If start, would start next week early with BMET and BNP at end of week

## 2021-01-26 NOTE — Telephone Encounter (Signed)
That is fine    3 wks as per pharmacy

## 2021-01-26 NOTE — Telephone Encounter (Signed)
Spoke with patient's daughter, [name]. Adv of results and to begin Jardiance 10 mg daily. Watch for s/s of UTI.  Reviewed s/s. He will continue all other meds and daily weights.  He will come on 02/19/21 for repeat labs. If Dr. Harrington Challenger rec sooner lab work I will message pt's daughter to let her know. She is grateful for information provided today.

## 2021-02-01 NOTE — Progress Notes (Signed)
Remote pacemaker transmission.   

## 2021-02-02 ENCOUNTER — Other Ambulatory Visit: Payer: Self-pay

## 2021-02-02 ENCOUNTER — Ambulatory Visit (INDEPENDENT_AMBULATORY_CARE_PROVIDER_SITE_OTHER): Payer: Medicare Other | Admitting: Internal Medicine

## 2021-02-02 ENCOUNTER — Telehealth: Payer: Self-pay | Admitting: Internal Medicine

## 2021-02-02 ENCOUNTER — Encounter: Payer: Self-pay | Admitting: Internal Medicine

## 2021-02-02 VITALS — BP 142/76 | HR 69 | Ht 69.0 in | Wt 294.0 lb

## 2021-02-02 DIAGNOSIS — I5042 Chronic combined systolic (congestive) and diastolic (congestive) heart failure: Secondary | ICD-10-CM | POA: Diagnosis not present

## 2021-02-02 DIAGNOSIS — I480 Paroxysmal atrial fibrillation: Secondary | ICD-10-CM | POA: Diagnosis not present

## 2021-02-02 DIAGNOSIS — Z79899 Other long term (current) drug therapy: Secondary | ICD-10-CM | POA: Diagnosis not present

## 2021-02-02 NOTE — Telephone Encounter (Signed)
Spoke with patient in office today. Sent reply to patient's daughter in [place].

## 2021-02-02 NOTE — Patient Instructions (Signed)
Medication Instructions:  Wait to start the newest medication (Jardiance) until after Dr. Harrington Challenger reviews your blood work from today and urine results from Dr. Louis Meckel at [facility]  *If you need a refill on your cardiac medications before your next appointment, please call your pharmacy*   Lab Work: Today: cbc, bmet, bnp, free T3, free T4  If you have labs (blood work) drawn today and your tests are completely normal, you will receive your results only by: Marland Kitchen MyChart Message (if you have MyChart) OR . A paper copy in the mail If you have any lab test that is abnormal or we need to change your treatment, we will call you to review the results.   Testing/Procedures: none  Follow-Up:   Other Instructions

## 2021-02-02 NOTE — Telephone Encounter (Signed)
Pt c/o medication issue:  1. Name of Medication: empagliflozin (JARDIANCE) 10 MG TABS tablet  2. How are you currently taking this medication (dosage and times per day)? Not currently taking. Received via mail order yesterday.   3. Are you having a reaction (difficulty breathing--STAT)? No   4. What is your medication issue? [name] is calling requesting to speak with Michalene. She states Tommy received this prescription via mail order and she is wanting to discuss if a alternative medication can be prescribed due to the cost. She states his copay for this med is $120. He received a 3 month supply so he has enough to last until then, but would prefer a cheaper option if possible. [name] is also requesting a DPR form be mailed to her address for her to fill out and have him sign. The last DPR we have on file advises to speak with his wife who has since passed away. If mailing a DPR form is possible her first & last name as well as address is [place] [address] [place] [address]. Please advise.

## 2021-02-02 NOTE — Progress Notes (Signed)
Cardiology Office Note:    Date:  02/04/2021   ID:  Levi [name] Jun 06, 1929, MRN [id number]  PCP:  Bernerd Limbo, MD  Cardiologist:  [name], MD  Electrophysiologist:  Cristopher Peru, MD   Referring MD: Bernerd Limbo, MD   Chief Complaint:  F/U of CHF    Patient Profile:    Levi [name] is a 87 y.o. male with:   Coronary artery disease  ? S/p CABG in 2011  ? S/p DES x 2 to LAD in 2013 ? LHC at [facility] in 6/18: patent LIMA-LAD, diff dz elsewhere.All VGs occluded. No targets for PCI>>medical Rx recommended   Parox AFib ? CHADS2-VASc=5 (age x 2, HTN, CAD, CHF) >> Apixaban 5 mg  ? Amiodarone Rx started in 10/2019 due to increased AF burden  Symptomatic bradycardia, Mobitz 2 ? S/p Pacemaker Jan 1601  Combined systolic and diastolic CHF ? Echocardiogram 12/19: EF 40-45 (previously normal) >> Med Rx ? Admx 10/2018 w/ DHF ? Decomp HF 12/2018 managed as OP with Metolazone ? Admx with Decomp CHF 03/2019  Hypertension   Hyperlipidemia   Chronic kidney disease stage 2   OSA on CPAP  Morbid obesity  Prior CV studies: Echocardiogram 03/15/20 EF 45-50, inf and apical HK, mild conc LVH, Gr 2 DD, normal RVSF, trivial MR, mild to mod TR   Echo 08/28/18 Mild conc LVH, EF 40-45, inf/inf-sept/apical HK, Gr 2 DD, MAC, normal RVSF, mod RAE, increased RA pressure, mild TR  Cardiac catheterization 02/10/2017 ([facility]) Coronary angiography reveals 3-vessel CAD.  LM is diffusely diseased with 50% distal stenosis.  LAD has a 80% mid instent restenosis,  LCX has serial 50-60% stenoses in the mid segment, OM2 is a small vessel and has a 70% ostial stenosis.  RCA has long diffuse 50-60% proximal and 60% distal stenosis.  LIMA to LAD is patent with a kink in the mid segment. Flow is normal.  LV-Gram reveals EF 50%. LVEDP is 8 mmHg.  SVG grafts are known to be chronically occluded, so not injected   Nuclear stress test 01/21/2017 ([facility]) 1. Moderate to severe inferoapical inducible ischemia with regadenoson 2. Mild to moderate mid inferior and posterior basal defect. Cannot rule out prior infarct. 3. Mild to moderate hypokinesis of the inferior wall. Dyskinesia/akinesia of the apex. 4. Moderately decreased left ventricular ejection fraction at 43%. 5. Please correlate with a cardiac ultrasound   Carotid US 09/14/2015 Bilateral ICA 1-39; partial left subclavian steal>>follow-up as needed   Echo (10/15):  Mild LVH, EF 55-60%, no RWMA, mod LAE, mild TR, PASP 42 mmHg  Nuclear (4/15):  Low risk stress nuclear study with a fixed defect in the apical anterior, inferior walls and in the true apex consistent with distal LAD scar. No ischemia. EF 56%  LHC (12/13):  Dist LM 60-70%, ostial LAD 40-50%, D1 occluded, prox LAD 80% then aneurysmal segment then 80%, ostial septal perforator 99%, prox CFX 50%, OM branch serial 60% lesions, prox RCA 30%, PL Br 50%; LIMA-LAD patent, SVG-LAD occluded, SVG-OM occluded, SVG-PDA occluded >>Med Rx >>continued angina >> PCI (2/14): 2.25 x 32 mm Promus DES and 2.5 x 20 mm Promus DES to prox and mid LAD  History of Present Illness:    Mr Levi [name] is an 87 yo with a hx of singif CAD, CHF, morbid obesity, HTN, HL, PAF and CKD I saw the pt in clinic in March 2022.     The pt returns today  for flollow up  Just seen in Urol this AM  Thought he had a UTI because of low back pain  Told he has prostatitis  Urine negative for UTI   He was placed on amoxicillin The pt says he continues to have SOB with activity.  Activity is overall limited Denies CP Still with LE edema    Past Medical History:  Diagnosis Date  . Asthma   . Benign neoplasm of colon   . CAD (coronary artery disease) Dec 2011   s/p CABG per Dr. Roxy Manns; had normal EF; All SVGs occluded per follow up cath with only LIMA to LAD patent; s/p PCI of the proximal and mid LAD February 2014 per Dr. Burt Knack   . Chronic combined systolic and diastolic CHF (congestive heart failure) ([place])   . CKD (chronic kidney disease), stage II   . Emphysema    "never treated for it" (10/05/2015)  . History of hiatal hernia   . HLD (hyperlipidemia)   . HTN (hypertension)   . Malaise and fatigue   . Obesity   . Osteopenia   . PAF (paroxysmal atrial fibrillation) ([place])   . Pneumonia 1960s X 1; 1990s X 1  . Presence of permanent cardiac pacemaker   . Prostate cancer ([place])   . Skin cancer    "face, nose, top of ears, scalp"  . Symptomatic bradycardia    STJ PPM, 10/05/15, Dr. Lovena Le    Current Medications: Current Meds  Medication Sig  . amiodarone (PACERONE) 100 MG tablet Take 1 tablet (100 mg total) by mouth daily.  Marland Kitchen apixaban (ELIQUIS) 2.5 MG TABS tablet Take 1 tablet (2.5 mg total) by mouth 2 (two) times daily.  . Artificial Tear Ointment (DRY EYES OP) Place 1 drop into both eyes 3 (three) times daily as needed (for dryness).  . cetirizine (ZYRTEC) 10 MG tablet Take 10 mg by mouth daily as needed for allergies or rhinitis.   Marland Kitchen empagliflozin (JARDIANCE) 10 MG TABS tablet Take 1 tablet (10 mg total) by mouth daily before breakfast.  . levothyroxine (SYNTHROID) 112 MCG tablet Take 1 tablet (112 mcg total) by mouth daily before breakfast.  . nitroGLYCERIN (NITROSTAT) 0.4 MG SL tablet Place 1 tablet (0.4 mg total) under the tongue every 5 (five) minutes x 3 doses as needed for chest pain.  . pantoprazole (PROTONIX) 40 MG tablet Take 1 tablet (40 mg total) by mouth at bedtime.  . polyethylene glycol (MIRALAX / GLYCOLAX) 17 g packet Take 17 g by mouth daily.   . potassium chloride SA (KLOR-CON) 20 MEQ tablet Take 1 tablet (20 mEq total) by mouth 2 (two) times daily.  . pravastatin (PRAVACHOL) 20 MG tablet TAKE 1 TABLET BY MOUTH  DAILY  . tamsulosin (FLOMAX) 0.4 MG CAPS capsule Take 0.4 mg by mouth at bedtime.   . torsemide (DEMADEX) 20 MG tablet Take 3 tablets (60 mg total) by mouth 2 (two) times daily.      Allergies:   Patient has no known allergies.   Social History   Tobacco Use  . Smoking status: Former Smoker    Packs/day: 1.00    Years: 30.00    Pack years: 30.00    Types: Cigarettes    Quit date: 09/07/1975    Years since quitting: 45.4  . Smokeless tobacco: Former Systems developer    Types: [place] date: 07/27/2015  Vaping Use  . Vaping Use: Never used  Substance Use Topics  . Alcohol use: No  Alcohol/week: 0.0 standard drinks  . Drug use: No     Family Hx: The patient's family history includes Heart attack in his father; Heart disease in his brother; Hypertension in his mother; Kidney cancer in his father; Prostate cancer in his brother; Skin cancer in his brother; Stroke in his mother. There is no history of Allergic rhinitis, Asthma, Eczema, or Urticaria.  ROS  See HPI  EKGs/Labs/Other Test Reviewed:    EKG:  EKG is ordered today.  V paced 70   Recent Labs: 03/14/2020: B Natriuretic Peptide 57.7; Magnesium 2.2 11/21/2020: ALT 11 01/03/2021: TSH 4.580 02/02/2021: BUN 24; Creatinine, Ser 1.63; Hemoglobin 13.2; NT-Pro BNP 1,548; Platelets 221; Potassium 3.7; Sodium 141   Recent Lipid Panel Lab Results  Component Value Date/Time   CHOL 101 11/21/2020 09:19 AM   TRIG 89 11/21/2020 09:19 AM   HDL 37 (L) 11/21/2020 09:19 AM   CHOLHDL 2.7 11/21/2020 09:19 AM   CHOLHDL 3 06/01/2014 09:30 AM   LDLCALC 46 11/21/2020 09:19 AM   LDLDIRECT 70.7 06/10/2013 01:44 PM    Physical Exam:    VS:  BP (!) 142/76   Pulse 69   Ht _0  (1.753 m)   Wt 294 lb (133.4 kg)   SpO2 94%   BMI 43.42 kg/m     Wt Readings from Last 3 Encounters:  02/02/21 294 lb (133.4 kg)  01/23/21 (!) 302 lb 6.4 oz (137.2 kg)  11/21/20 (!) 307 lb (139.3 kg)    Morbidly obese 87 yo in NAD  Examined in chair NEck is full  JVP does not appear increased    Lungs CTA   Cardiac RRR  NoS3  No signif murmurs Abd is distended  Ext Lower extremies with 1+ edema bilaterally   Chronic skin changes    ASSESSMENT & PLAN:    1. Chronic combined systolic and diastolic CHF (congestive heart failure) (HCC) Ischemic CM NYHA Class III Last echo in July 2021 showed EF 45-50 with moderate diastolic dysfunction Volume control has been challenging in setting of renal dysfunction and continued fluid and salt intake      After review of recent labs, I would not push diuretics to higher level.   I would recomm trial of Jardiance with close follow up of renal function    Stressed careful following of fluid and salt intake     Pt is being treated for prostatitis   Urine with no evid of UTI     Follow this closely.   If no signif change in vol/edema, may stop as potential urol problems may outweigh benefits    2. Coronary artery disease involving native coronary artery of native heart with angina pectoris [facility]) s/p CABG in 2011; DES x2 to the LAD in 2013. [facility] LIMA-LAD and diffuse disease elsewhere. All vein grafts were known to be occluded. No targets for PCI.  Pt denies CP   Chronic SOB unchanged       3. PAF (paroxysmal atrial fibrillation) (HCC)  Continue Eliquis and amiodarone   Pt with PPM      4. Hypothyroidism, unspecified type  TSH sl abnormal in May   Has been labile   WIth Amio Rx will check again     6. CKD (chronic kidney disease) stage 2   Will check labs today     7. S/p PPM   Pt follows with [name] in EP     Medication Adjustments/Labs and Tests Ordered:  Current medicines are reviewed at length with the patient today.  Concerns regarding medicines are outlined above.  Tests Ordered: Orders Placed This Encounter  Procedures  . CBC  . Basic metabolic panel  . Pro b natriuretic peptide (BNP)  . T3, free  . T4, free   Medication Changes: No orders of the defined types were placed in this encounter.   Signed, [name], MD  02/04/2021 9:09 AM    [address],  [place], [place]  [address] Phone: [phone]; Fax: [phone]

## 2021-02-02 NOTE — Telephone Encounter (Signed)
Pt has appointment this afternoon with Dr. Harrington Challenger.  Can discuss Jardiance at that time and pt can complete new DPR.  Will route to prior auth nurse to review if pt can get for lower copay.

## 2021-02-03 LAB — BASIC METABOLIC PANEL
BUN/Creatinine Ratio: 15 (ref 10–24)
BUN: 24 mg/dL (ref 8–27)
CO2: 26 mmol/L (ref 20–29)
Calcium: 9 mg/dL (ref 8.6–10.2)
Chloride: 100 mmol/L (ref 96–106)
Creatinine, Ser: 1.63 mg/dL — ABNORMAL HIGH (ref 0.76–1.27)
Glucose: 112 mg/dL — ABNORMAL HIGH (ref 65–99)
Potassium: 3.7 mmol/L (ref 3.5–5.2)
Sodium: 141 mmol/L (ref 134–144)
eGFR: 40 mL/min/{1.73_m2} — ABNORMAL LOW (ref >59)

## 2021-02-03 LAB — CBC
Hematocrit: 40.6 % (ref 37.5–51.0)
Hemoglobin: 13.2 g/dL (ref 13.0–17.7)
MCH: 30.2 pg (ref 26.6–33.0)
MCHC: 32.5 g/dL (ref 31.5–35.7)
MCV: 93 fL (ref 79–97)
Platelets: 221 10*3/uL (ref 150–450)
RBC: 4.37 x10E6/uL (ref 4.14–5.80)
RDW: 13.7 % (ref 11.6–15.4)
WBC: 7.3 10*3/uL (ref 3.4–10.8)

## 2021-02-03 LAB — PRO B NATRIURETIC PEPTIDE: NT-Pro BNP: 1548 pg/mL — ABNORMAL HIGH (ref 0–486)

## 2021-02-03 LAB — T4, FREE: Free T4: 2.15 ng/dL — ABNORMAL HIGH (ref 0.82–1.77)

## 2021-02-03 LAB — T3, FREE: T3, Free: 2.1 pg/mL (ref 2.0–4.4)

## 2021-02-13 ENCOUNTER — Other Ambulatory Visit: Payer: Self-pay

## 2021-02-14 ENCOUNTER — Other Ambulatory Visit: Payer: Self-pay

## 2021-02-14 ENCOUNTER — Ambulatory Visit (INDEPENDENT_AMBULATORY_CARE_PROVIDER_SITE_OTHER): Payer: Medicare Other

## 2021-02-14 DIAGNOSIS — Z23 Encounter for immunization: Secondary | ICD-10-CM | POA: Diagnosis not present

## 2021-02-14 NOTE — Progress Notes (Signed)
   Covid-19 Vaccination Clinic  Name:  Levi [name]    MRN: [id number] DOB: 28-Mar-1929  02/14/2021  Levi [name] was observed post Covid-19 immunization for 15 minutes without incident. He was provided with Vaccine Information Sheet and instruction to access the V-Safe system.   Levi [name] was instructed to call 911 with any severe reactions post vaccine: Difficulty breathing  Swelling of face and throat  A fast heartbeat  A bad rash all over body  Dizziness and weakness

## 2021-02-19 ENCOUNTER — Other Ambulatory Visit: Payer: Medicare Other

## 2021-02-19 ENCOUNTER — Other Ambulatory Visit: Payer: Self-pay

## 2021-02-19 DIAGNOSIS — I5042 Chronic combined systolic (congestive) and diastolic (congestive) heart failure: Secondary | ICD-10-CM

## 2021-02-20 LAB — BASIC METABOLIC PANEL
BUN/Creatinine Ratio: 16 (ref 10–24)
BUN: 25 mg/dL (ref 8–27)
CO2: 26 mmol/L (ref 20–29)
Calcium: 8.8 mg/dL (ref 8.6–10.2)
Chloride: 98 mmol/L (ref 96–106)
Creatinine, Ser: 1.55 mg/dL — ABNORMAL HIGH (ref 0.76–1.27)
Glucose: 101 mg/dL — ABNORMAL HIGH (ref 65–99)
Potassium: 3.7 mmol/L (ref 3.5–5.2)
Sodium: 141 mmol/L (ref 134–144)
eGFR: 43 mL/min/{1.73_m2} — ABNORMAL LOW (ref >59)

## 2021-02-20 LAB — PRO B NATRIURETIC PEPTIDE: NT-Pro BNP: 1918 pg/mL — ABNORMAL HIGH (ref 0–486)

## 2021-02-21 ENCOUNTER — Telehealth: Payer: Self-pay | Admitting: Internal Medicine

## 2021-02-21 NOTE — Telephone Encounter (Signed)
New message:     Patient daughter calling stating that Dr. Harrington Challenger told her to get a apt with AP 10 2 months, however the daughter states Dr. Harrington Challenger sent a message threw mychart. Please advise.

## 2021-02-22 NOTE — Telephone Encounter (Signed)
Replied to patient's daughter's mychart message.

## 2021-02-28 ENCOUNTER — Other Ambulatory Visit: Payer: Self-pay | Admitting: Internal Medicine

## 2021-03-05 ENCOUNTER — Other Ambulatory Visit: Payer: Self-pay | Admitting: Internal Medicine

## 2021-03-06 NOTE — Telephone Encounter (Signed)
Prescription refill request for Eliquis received. Indication: afib  Last office visit: ross 02/02/2021 Scr: 1.55, 02/19/2021 Age: 87 yo  Weight: 133.4 kg   Pt is on the correct dose of Eliquis per dosing criteria, prescription refill sent for Eliquis 2.5mg  BID.

## 2021-03-10 ENCOUNTER — Other Ambulatory Visit: Payer: Self-pay | Admitting: Internal Medicine

## 2021-04-11 ENCOUNTER — Ambulatory Visit (INDEPENDENT_AMBULATORY_CARE_PROVIDER_SITE_OTHER): Payer: Medicare Other

## 2021-04-11 DIAGNOSIS — I442 Atrioventricular block, complete: Secondary | ICD-10-CM | POA: Diagnosis not present

## 2021-04-12 LAB — CUP PACEART REMOTE DEVICE CHECK
Battery Remaining Longevity: 43 mo
Battery Remaining Percentage: 38 %
Battery Voltage: 2.96 V
Brady Statistic AP VP Percent: 90 %
Brady Statistic AP VS Percent: 1 %
Brady Statistic AS VP Percent: 10 %
Brady Statistic AS VS Percent: 0 %
Brady Statistic RA Percent Paced: 1 %
Brady Statistic RV Percent Paced: 99 %
Date Time Interrogation Session: 20220810151503
Implantable Lead Implant Date: 20170202
Implantable Lead Implant Date: 20170202
Implantable Lead Location: 753859
Implantable Lead Location: 753860
Implantable Pulse Generator Implant Date: 20170202
Lead Channel Impedance Value: 350 Ohm
Lead Channel Impedance Value: 400 Ohm
Lead Channel Pacing Threshold Amplitude: 0.875 V
Lead Channel Pacing Threshold Amplitude: 1.25 V
Lead Channel Pacing Threshold Pulse Width: 0.4 ms
Lead Channel Pacing Threshold Pulse Width: 0.4 ms
Lead Channel Sensing Intrinsic Amplitude: 1.1 mV
Lead Channel Sensing Intrinsic Amplitude: 8.5 mV
Lead Channel Setting Pacing Amplitude: 1.125
Lead Channel Setting Pacing Amplitude: 2.5 V
Lead Channel Setting Pacing Pulse Width: 0.4 ms
Lead Channel Setting Sensing Sensitivity: 2 mV
Pulse Gen Model: 2240
Pulse Gen Serial Number: 7864186

## 2021-05-03 NOTE — Progress Notes (Signed)
Remote pacemaker transmission.   

## 2021-05-09 ENCOUNTER — Other Ambulatory Visit: Payer: Self-pay | Admitting: Internal Medicine

## 2021-05-10 ENCOUNTER — Telehealth: Payer: Self-pay | Admitting: Internal Medicine

## 2021-05-10 DIAGNOSIS — Z79899 Other long term (current) drug therapy: Secondary | ICD-10-CM

## 2021-05-10 DIAGNOSIS — I5042 Chronic combined systolic (congestive) and diastolic (congestive) heart failure: Secondary | ICD-10-CM

## 2021-05-10 NOTE — Telephone Encounter (Signed)
Agree with plans for labs

## 2021-05-10 NOTE — Telephone Encounter (Signed)
Patient's daughter states the patient called her a few minutes ago stating he has not been feeling well the past few days. She states he has also gained about 3 lbs in the past 2 days. She states the patient is requesting orders for lab work.

## 2021-05-10 NOTE — Telephone Encounter (Signed)
Spoke w patient. He said he is weak and tired.  This has been for about 3-4 weeks at least. Still going out and eating, riding around.  Not sleeping well.  Up every other to use bathroom.  Taking torsemide 60 mg BID potassium 20 meq BID.  Has not missed any that he knows of.  Just gives out.  Late afternoon, evenings are the worst.  "I hate to see dark come".  Sees son and daughter pretty regularly.  Not able to sleeping well.  Sleeps in chair regularly.   No labs since June 20. He can come tomorrow for blood work (bmet, bnp, cbc) tsh if okay with Dr. Harrington Challenger.

## 2021-05-11 ENCOUNTER — Other Ambulatory Visit: Payer: Medicare Other | Admitting: *Deleted

## 2021-05-11 ENCOUNTER — Other Ambulatory Visit: Payer: Self-pay

## 2021-05-11 DIAGNOSIS — Z79899 Other long term (current) drug therapy: Secondary | ICD-10-CM

## 2021-05-11 DIAGNOSIS — I5042 Chronic combined systolic (congestive) and diastolic (congestive) heart failure: Secondary | ICD-10-CM

## 2021-05-11 NOTE — Telephone Encounter (Signed)
Orders signed. Pt completing blood work now.

## 2021-05-12 LAB — CBC
Hematocrit: 40.1 % (ref 37.5–51.0)
Hemoglobin: 13.2 g/dL (ref 13.0–17.7)
MCH: 31.1 pg (ref 26.6–33.0)
MCHC: 32.9 g/dL (ref 31.5–35.7)
MCV: 94 fL (ref 79–97)
Platelets: 193 10*3/uL (ref 150–450)
RBC: 4.25 x10E6/uL (ref 4.14–5.80)
RDW: 14.6 % (ref 11.6–15.4)
WBC: 7.6 10*3/uL (ref 3.4–10.8)

## 2021-05-12 LAB — BASIC METABOLIC PANEL
BUN/Creatinine Ratio: 13 (ref 10–24)
BUN: 21 mg/dL (ref 10–36)
CO2: 27 mmol/L (ref 20–29)
Calcium: 8.7 mg/dL (ref 8.6–10.2)
Chloride: 98 mmol/L (ref 96–106)
Creatinine, Ser: 1.65 mg/dL — ABNORMAL HIGH (ref 0.76–1.27)
Glucose: 127 mg/dL — ABNORMAL HIGH (ref 65–99)
Potassium: 3.8 mmol/L (ref 3.5–5.2)
Sodium: 140 mmol/L (ref 134–144)
eGFR: 39 mL/min/{1.73_m2} — ABNORMAL LOW (ref >59)

## 2021-05-12 LAB — TSH: TSH: 3.5 u[IU]/mL (ref 0.450–4.500)

## 2021-05-12 LAB — PRO B NATRIURETIC PEPTIDE: NT-Pro BNP: 1997 pg/mL — ABNORMAL HIGH (ref 0–486)

## 2021-05-15 MED ORDER — TORSEMIDE 20 MG PO TABS: 60.0000 mg | ORAL_TABLET | Freq: Two times a day (BID) | ORAL | 3 refills | Status: AC

## 2021-05-15 MED ORDER — TORSEMIDE 20 MG PO TABS: 60.0000 mg | ORAL_TABLET | Freq: Two times a day (BID) | ORAL | 3 refills | Status: DC

## 2021-05-25 ENCOUNTER — Telehealth: Payer: Self-pay | Admitting: Internal Medicine

## 2021-05-25 NOTE — Telephone Encounter (Signed)
Pt c/o medication issue:  1. Name of Medication: empagliflozin (JARDIANCE) 10 MG TABS tablet  2. How are you currently taking this medication (dosage and times per day)? Take 1 tablet (10 mg total) by mouth daily before breakfast.  3. Are you having a reaction (difficulty breathing--STAT)? no  4. What is your medication issue? Patient is in the donut whole and unable to pay full price. Daughter is calling in to see what other options is there.

## 2021-05-25 NOTE — Telephone Encounter (Signed)
[name], are you able to see if pt qualifies for pt assistance for Jardiance while in the donut hole? Thank you!  [email or web address]

## 2021-05-28 NOTE — Telephone Encounter (Signed)
**Note De-Identified Yemaya Barnier Obfuscation** I s/w [name], the pts daughter and DPR, who states that her dad is almost out of Jardiance and cannot afford the current cost since he is in his donut hole.  We discussed pt asst through [facility] and she is aware that if he is approved for asst he will receive it free of charge for the rest of this year. She is interested in this program so I gave her their phone number and advised her to call them with questions concerning their pt asst program for Jardiance and the pts eligibility to be approved for the program.  She is aware to request that they mail him an application or to go online at [email or web address]  to print an application.   [name] is aware that I have also sent her a [facility] message with all of this information as well.  She is requesting 2 weeks of Jardiance samples for the pt to take while they are working on this. She is advised that we are leaving him enough samples to last him 2 weeks and that the sooner they get the application to [place] the better. She thanked me for our assistance.

## 2021-05-29 NOTE — Telephone Encounter (Signed)
It looks like Kelly left these up front yesterday. Thanks

## 2021-05-30 ENCOUNTER — Telehealth: Payer: Self-pay

## 2021-05-30 NOTE — Telephone Encounter (Signed)
**Note De-Identified Cataldo Cosgriff Obfuscation** The pts [employer] Cares pt asst application for Jardiance was left at the office. I have completed the provider page of his application and have emailed his application to Dr Alan Ripper nurse so she can obtain Dr Oralia Rud (DOD) signature as Dr Harrington Challenger is out of the office this week, and to fax all to [place] at the fax number written on the cover letter included or to place in the to be faxed basket in Medical Records to be faxed.

## 2021-06-01 NOTE — Telephone Encounter (Signed)
Late entry for 05/31/21 - patient assistance forms signed by Dr. Gasper Sells (DOD) and have been faxed to [facility].

## 2021-06-01 NOTE — Telephone Encounter (Signed)
**Note De-Identified Levi [name] Obfuscation** Letter received Levi [name] fax from [facility] stating that they approved the pt for asst with Jardiance until 09/01/2021.  The letter states that they have notified the pt of this approval as well.

## 2021-06-11 NOTE — Progress Notes (Signed)
Cardiology Office Note:    Date:  06/12/2021   ID:  [name], DOB 11-Jul-1929, MRN [id number]  PCP:  Bernerd Limbo, MD   [facility] Providers Cardiologist:  [name], MD Electrophysiologist:  Cristopher Peru, MD    Referring MD: Bernerd Limbo, MD   Chief Complaint:  Follow-up for CHF, CAD, atrial fibrillation    Patient Profile:   Levi [name] is a 87 y.o. male with:  Coronary artery disease  S/p CABG in 2011  S/p DES x 2 to LAD in 2013 LHC at [facility] in 6/18: patent LIMA-LAD, diff dz elsewhere.  All VGs occluded.  No targets for PCI >> medical Rx    Parox AFib CHADS2-VASc=5 (age x 2, HTN, CAD, CHF) >> Apixaban 5 mg  Amiodarone Rx started in 10/2019 due to increased AF burden Symptomatic bradycardia, Mobitz 2 S/p Pacemaker Jan 4268 Combined systolic and diastolic CHF Echocardiogram 12/19: EF 40-45 (previously normal) >> Med Rx Admx several times in 2020 with decompensated HF AKI in 2021 >> Metolazone DC'd  Hypertension  Hyperlipidemia  Chronic kidney disease stage 2  OSA on CPAP Morbid obesity   Prior CV studies: Echocardiogram 03/15/20 EF 45-50, inf and apical HK, mild conc LVH, Gr 2 DD, normal RVSF, trivial MR, mild to mod TR   Echo 08/28/18 Mild conc LVH, EF 40-45, inf/inf-sept/apical HK, Gr 2 DD, MAC, normal RVSF, mod RAE, increased RA pressure, mild TR   Cardiac catheterization 02/10/2017 ([facility]) Coronary angiography reveals 3-vessel CAD.  LM is diffusely diseased with 50% distal stenosis.   LAD has a 80% mid instent restenosis,  LCX has serial 50-60% stenoses in the mid segment, OM2 is a small vessel and has a 70% ostial stenosis.  RCA has long diffuse 50-60% proximal and 60% distal stenosis.  LIMA to LAD is patent with a kink in the mid segment. Flow is normal.  LV-Gram reveals EF 50%. LVEDP is 8 mmHg.  SVG grafts are known to be chronically occluded, so not injected    Nuclear stress test 01/21/2017 ([facility]) 1.  Moderate to severe inferoapical inducible ischemia with regadenoson 2.  Mild to moderate mid inferior and posterior basal defect. Cannot rule out prior infarct. 3.  Mild to moderate hypokinesis of the inferior wall. Dyskinesia/akinesia of the apex. 4.  Moderately decreased left ventricular ejection fraction at 43%. 5.  Please correlate with a cardiac ultrasound   Echo 06/20/2016 EF 34-19, grade 1 diastolic dysfunction, MAC, moderate LAE   Carotid US 09/14/2015 Bilateral ICA 1-39; partial left subclavian steal >> follow-up as needed    History of Present Illness: Levi [name] was last seen by Dr. Harrington Challenger in 6/22.  He was started on Empagliflozin.  He returns for f/u.  He is here with his daughter.  He continues to be short of breath with minimal activity.  However, he does remain busy doing things.  He has not had chest pain, syncope, orthopnea.  He has chronic leg edema that is overall stable.  His weights are stable.  He notes he feels weak after taking Tamsulosin in the evenings.  He continues to have frequent nocturia.           Past Medical History:  Diagnosis Date   Asthma    Benign neoplasm of colon    CAD (coronary artery disease) Dec 2011   s/p CABG per Dr. Roxy Manns; had normal EF; All SVGs occluded per follow up cath with only LIMA  to LAD patent; s/p PCI of the proximal and mid LAD February 2014 per Dr. Burt Knack   Chronic combined systolic and diastolic CHF (congestive heart failure) ([place])    CKD (chronic kidney disease), stage II    Emphysema    "never treated for it" (10/05/2015)   History of hiatal hernia    HLD (hyperlipidemia)    HTN (hypertension)    Malaise and fatigue    Obesity    Osteopenia    PAF (paroxysmal atrial fibrillation) (HCC)    Pneumonia 1960s X 1; 1990s X 1   Presence of permanent cardiac pacemaker    Prostate cancer ([place])    Skin cancer    "face, nose, top of ears, scalp"   Symptomatic bradycardia    STJ PPM, 10/05/15, Dr. Lovena Le    Current Medications: Current Meds  Medication Sig   amiodarone (PACERONE) 100 MG tablet Take 1 tablet (100 mg total) by mouth daily.   apixaban (ELIQUIS) 2.5 MG TABS tablet TAKE 1 TABLET BY MOUTH  TWICE DAILY   Artificial Tear Ointment (DRY EYES OP) Place 1 drop into both eyes 3 (three) times daily as needed (for dryness).   cetirizine (ZYRTEC) 10 MG tablet Take 10 mg by mouth daily as needed for allergies or rhinitis.    empagliflozin (JARDIANCE) 10 MG TABS tablet Take 1 tablet (10 mg total) by mouth daily before breakfast.   levothyroxine (SYNTHROID) 112 MCG tablet Take 1 tablet (112 mcg total) by mouth daily before breakfast.   nitroGLYCERIN (NITROSTAT) 0.4 MG SL tablet Place 1 tablet (0.4 mg total) under the tongue every 5 (five) minutes x 3 doses as needed for chest pain.   pantoprazole (PROTONIX) 40 MG tablet Take 1 tablet (40 mg total) by mouth at bedtime.   polyethylene glycol (MIRALAX / GLYCOLAX) 17 g packet Take 17 g by mouth daily.    potassium chloride SA (KLOR-CON) 20 MEQ tablet TAKE 1 TABLET BY MOUTH  TWICE DAILY   pravastatin (PRAVACHOL) 20 MG tablet TAKE 1 TABLET BY MOUTH  DAILY   tamsulosin (FLOMAX) 0.4 MG CAPS capsule Take 0.4 mg by mouth at bedtime.    torsemide (DEMADEX) 20 MG tablet Take 3 tablets (60 mg total) by mouth 2 (two) times daily.    Allergies:   Patient has no known allergies.   Social History   Tobacco Use   Smoking status: Former    Packs/day: 1.00    Years: 30.00    Pack years: 30.00    Types: Cigarettes    Quit date: 09/07/1975    Years since quitting: 45.7   Smokeless tobacco: Former    Types: Chew    Quit date: 07/27/2015  Vaping Use   Vaping Use: Never used  Substance Use Topics   Alcohol use: No    Alcohol/week: 0.0 standard drinks   Drug use: No    Family Hx: The patient's family history includes Heart attack in his father; Heart disease in his brother; Hypertension in his mother; Kidney cancer in his father; Prostate cancer in his  brother; Skin cancer in his brother; Stroke in his mother. There is no history of Allergic rhinitis, Asthma, Eczema, or Urticaria.  Review of Systems  Gastrointestinal:  Negative for hematemesis.  Genitourinary:  Negative for hematuria.    EKGs/Labs/Other Test Reviewed:    EKG:  EKG is not ordered today.  The ekg ordered today demonstrates N/A  Recent Labs: 11/21/2020: ALT 11 05/11/2021: BUN 21; Creatinine, Ser 1.65; Hemoglobin 13.2; NT-Pro BNP  1,997; Platelets 193; Potassium 3.8; Sodium 140; TSH 3.500   Recent Lipid Panel Lab Results  Component Value Date/Time   CHOL 101 11/21/2020 09:19 AM   TRIG 89 11/21/2020 09:19 AM   HDL 37 (L) 11/21/2020 09:19 AM   LDLCALC 46 11/21/2020 09:19 AM   LDLDIRECT 70.7 06/10/2013 01:44 PM     Risk Assessment/Calculations:    CHA2DS2-VASc Score = 5   This indicates a 7.2% annual risk of stroke. The patient's score is based upon: CHF History: 1 HTN History: 1 Diabetes History: 0 Stroke History: 0 Vascular Disease History: 1 Age Score: 2 Gender Score: 0        Physical Exam:    VS:  BP (!) 144/68   Pulse 73   Ht _0  (1.753 m)   Wt 295 lb 3.2 oz (133.9 kg)   SpO2 96%   BMI 43.59 kg/m     Wt Readings from Last 3 Encounters:  06/12/21 295 lb 3.2 oz (133.9 kg)  02/02/21 294 lb (133.4 kg)  01/23/21 (!) 302 lb 6.4 oz (137.2 kg)    Constitutional:      Appearance: Healthy appearance. Not in distress.  Neck:     Vascular: No JVR.  Pulmonary:     Effort: Pulmonary effort is normal.     Breath sounds: No wheezing. No rales.  Cardiovascular:     Normal rate. Regular rhythm. Normal S1. Normal S2.      Murmurs: There is a grade 2/6 systolic murmur at the ULSB.  Edema:    Peripheral edema present.    Pretibial: bilateral trace edema of the pretibial area.    Ankle: bilateral 1+ edema of the ankle. Abdominal:     Palpations: Abdomen is soft.  Skin:    General: Skin is warm and dry.  Neurological:     General: No focal deficit  present.     Mental Status: Alert and oriented to person, place and time.     Cranial Nerves: Cranial nerves are intact.       ASSESSMENT & PLAN:   1. HFmrEF (heart failure with mildly reduced EF) EF 45-50.  Ischemic cardiomyopathy.  NYHA III.  Overall, his volume status appears stable.  His weights are unchanged and his lungs are clear.  I suspect he has multifactorial dyspnea related to coronary artery disease, heart failure, obesity, deconditioning.  He had an admission with acute kidney injury last year in the setting of metolazone use.  Think we should be cautious with adjusting his diuretics further.  At this point, I recommend continuing on torsemide 60 mg twice daily, empagliflozin 10 mg daily.  Blood pressure is limited use of beta-blocker and ACE/ARB in the past.  Blood pressure is somewhat elevated today.  I will obtain a follow-up BMET today.  If his potassium is low and renal function appears stable, we could consider low-dose spironolactone.  Follow-up in 3 to 4 months with Dr. Harrington Challenger.  2. Coronary artery disease involving native coronary artery of native heart with angina pectoris ([place]) History of CABG in 2011 and drug-eluting stent x2 to the LAD in 2013.  Cardiac catheterization in 2018 at [facility] demonstrated a patent LIMA-LAD and diffuse disease elsewhere.  All vein grafts were known to be occluded.  There were no targets for PCI.  He has been managed medically.  He is not having chest pain to suggest angina.  I suspect his dyspnea could be an anginal equivalent.  If his blood pressure continues to  run high, we could consider adding low-dose isosorbide mononitrate.  He is not on aspirin as he is on Apixaban.  Continue pravastatin 20 mg daily.  3. PAF (paroxysmal atrial fibrillation) (HCC) Exam suggest sinus rhythm today.  He remains on amiodarone, apixaban.  Creatinine has remained above 1.5.  Continue apixaban 2.5 mg twice daily.  4. High risk medication use Continue amiodarone  100 mg daily.  ALT in March was normal.  TSH in September was normal.  5. Hypothyroidism, unspecified type As noted, TSH in September was normal.  Continue Synthroid 112 mcg daily.  6. CKD (chronic kidney disease) stage 2, GFR 60-89 ml/min Obtain follow-up BMET today.  7. Cardiac pacemaker in situ Tinea follow-up with EP.     Dispo:  Return in about 4 months (around 10/13/2021) for Routine follow up in 4 months with Dr.Ross. .   Medication Adjustments/Labs and Tests Ordered: Current medicines are reviewed at length with the patient today.  Concerns regarding medicines are outlined above.  Tests Ordered: Orders Placed This Encounter  Procedures   Basic Metabolic Panel (BMET)   Medication Changes: No orders of the defined types were placed in this encounter.  Signed, Richardson Dopp, PA-C  06/12/2021 12:40 PM    [facility] [address], [place], [place]  [address] Phone: [phone]; Fax: [phone]

## 2021-06-12 ENCOUNTER — Ambulatory Visit (INDEPENDENT_AMBULATORY_CARE_PROVIDER_SITE_OTHER): Payer: Medicare Other | Admitting: Physician Assistant

## 2021-06-12 ENCOUNTER — Other Ambulatory Visit: Payer: Self-pay

## 2021-06-12 ENCOUNTER — Encounter: Payer: Self-pay | Admitting: Physician Assistant

## 2021-06-12 VITALS — BP 144/68 | HR 73 | Ht 69.0 in | Wt 295.2 lb

## 2021-06-12 DIAGNOSIS — I25119 Atherosclerotic heart disease of native coronary artery with unspecified angina pectoris: Secondary | ICD-10-CM

## 2021-06-12 DIAGNOSIS — Z79899 Other long term (current) drug therapy: Secondary | ICD-10-CM

## 2021-06-12 DIAGNOSIS — I5020 Unspecified systolic (congestive) heart failure: Secondary | ICD-10-CM

## 2021-06-12 DIAGNOSIS — Z950 Presence of cardiac pacemaker: Secondary | ICD-10-CM

## 2021-06-12 DIAGNOSIS — E039 Hypothyroidism, unspecified: Secondary | ICD-10-CM

## 2021-06-12 DIAGNOSIS — I480 Paroxysmal atrial fibrillation: Secondary | ICD-10-CM | POA: Diagnosis not present

## 2021-06-12 DIAGNOSIS — N182 Chronic kidney disease, stage 2 (mild): Secondary | ICD-10-CM

## 2021-06-12 NOTE — Patient Instructions (Signed)
Medication Instructions:  Your physician recommends that you continue on your current medications as directed. Please refer to the Current Medication list given to you today.  *If you need a refill on your cardiac medications before your next appointment, please call your pharmacy*   Lab Work: TODAY!!!!!! BMET  If you have labs (blood work) drawn today and your tests are completely normal, you will receive your results only by: [place] (if you have MyChart) OR A paper copy in the mail If you have any lab test that is abnormal or we need to change your treatment, we will call you to review the results.   Testing/Procedures:  -NONE-  Follow-Up: At [facility], you and your health needs are our priority.  As part of our continuing mission to provide you with exceptional heart care, we have created designated Provider Care Teams.  These Care Teams include your primary Cardiologist (physician) and Advanced Practice Providers (APPs -  Physician Assistants and Nurse Practitioners) who all work together to provide you with the care you need, when you need it.  We recommend signing up for the patient portal called "MyChart".  Sign up information is provided on this After Visit Summary.  MyChart is used to connect with patients for Virtual Visits (Telemedicine).  Patients are able to view lab/test results, encounter notes, upcoming appointments, etc.  Non-urgent messages can be sent to your provider as well.   To learn more about what you can do with MyChart, go to [email or web address].    Your next appointment:   4 month(s)  The format for your next appointment:   In Person  Provider:   [name], MD   Other Instructions  Please call the urologist about your flomax making you feel bad!!!!

## 2021-06-13 LAB — BASIC METABOLIC PANEL
BUN/Creatinine Ratio: 14 (ref 10–24)
BUN: 20 mg/dL (ref 10–36)
CO2: 27 mmol/L (ref 20–29)
Calcium: 8.9 mg/dL (ref 8.6–10.2)
Chloride: 96 mmol/L (ref 96–106)
Creatinine, Ser: 1.4 mg/dL — ABNORMAL HIGH (ref 0.76–1.27)
Glucose: 87 mg/dL (ref 70–99)
Potassium: 3.9 mmol/L (ref 3.5–5.2)
Sodium: 139 mmol/L (ref 134–144)
eGFR: 48 mL/min/{1.73_m2} — ABNORMAL LOW (ref >59)

## 2021-06-14 DIAGNOSIS — Z79899 Other long term (current) drug therapy: Secondary | ICD-10-CM

## 2021-06-14 DIAGNOSIS — I10 Essential (primary) hypertension: Secondary | ICD-10-CM

## 2021-06-14 DIAGNOSIS — I5032 Chronic diastolic (congestive) heart failure: Secondary | ICD-10-CM

## 2021-06-14 MED ORDER — SPIRONOLACTONE 25 MG PO TABS: 12.5000 mg | ORAL_TABLET | Freq: Every day | ORAL | 3 refills | Status: AC

## 2021-06-14 NOTE — Addendum Note (Signed)
Addended by: Thora Lance on: 06/14/2021 11:22 AM   Modules accepted: Orders

## 2021-06-15 ENCOUNTER — Other Ambulatory Visit: Payer: Self-pay | Admitting: *Deleted

## 2021-06-15 MED ORDER — POTASSIUM CHLORIDE CRYS ER 20 MEQ PO TBCR: 30.0000 meq | EXTENDED_RELEASE_TABLET | Freq: Every day | ORAL | 3 refills | Status: DC

## 2021-06-25 ENCOUNTER — Other Ambulatory Visit: Payer: Medicare Other | Admitting: *Deleted

## 2021-06-25 ENCOUNTER — Other Ambulatory Visit: Payer: Self-pay

## 2021-06-25 ENCOUNTER — Other Ambulatory Visit: Payer: Self-pay | Admitting: *Deleted

## 2021-06-25 DIAGNOSIS — I10 Essential (primary) hypertension: Secondary | ICD-10-CM

## 2021-06-25 DIAGNOSIS — Z79899 Other long term (current) drug therapy: Secondary | ICD-10-CM

## 2021-06-25 LAB — BASIC METABOLIC PANEL
BUN/Creatinine Ratio: 17 (ref 10–24)
BUN: 24 mg/dL (ref 10–36)
CO2: 24 mmol/L (ref 20–29)
Calcium: 8.6 mg/dL (ref 8.6–10.2)
Chloride: 99 mmol/L (ref 96–106)
Creatinine, Ser: 1.42 mg/dL — ABNORMAL HIGH (ref 0.76–1.27)
Glucose: 104 mg/dL — ABNORMAL HIGH (ref 70–99)
Potassium: 4.3 mmol/L (ref 3.5–5.2)
Sodium: 138 mmol/L (ref 134–144)
eGFR: 47 mL/min/{1.73_m2} — ABNORMAL LOW (ref >59)

## 2021-07-02 ENCOUNTER — Other Ambulatory Visit: Payer: Self-pay

## 2021-07-02 ENCOUNTER — Other Ambulatory Visit: Payer: Medicare Other | Admitting: *Deleted

## 2021-07-02 DIAGNOSIS — I10 Essential (primary) hypertension: Secondary | ICD-10-CM

## 2021-07-02 DIAGNOSIS — Z79899 Other long term (current) drug therapy: Secondary | ICD-10-CM

## 2021-07-02 LAB — BASIC METABOLIC PANEL
BUN/Creatinine Ratio: 14 (ref 10–24)
BUN: 25 mg/dL (ref 10–36)
CO2: 28 mmol/L (ref 20–29)
Calcium: 9.2 mg/dL (ref 8.6–10.2)
Chloride: 97 mmol/L (ref 96–106)
Creatinine, Ser: 1.78 mg/dL — ABNORMAL HIGH (ref 0.76–1.27)
Glucose: 98 mg/dL (ref 70–99)
Potassium: 4.2 mmol/L (ref 3.5–5.2)
Sodium: 138 mmol/L (ref 134–144)
eGFR: 36 mL/min/{1.73_m2} — ABNORMAL LOW (ref >59)

## 2021-07-03 ENCOUNTER — Other Ambulatory Visit: Payer: Self-pay | Admitting: *Deleted

## 2021-07-03 DIAGNOSIS — Z79899 Other long term (current) drug therapy: Secondary | ICD-10-CM

## 2021-07-11 ENCOUNTER — Other Ambulatory Visit: Payer: Medicare Other

## 2021-07-11 ENCOUNTER — Ambulatory Visit (INDEPENDENT_AMBULATORY_CARE_PROVIDER_SITE_OTHER): Payer: Medicare Other

## 2021-07-11 DIAGNOSIS — I442 Atrioventricular block, complete: Secondary | ICD-10-CM | POA: Diagnosis not present

## 2021-07-12 LAB — CUP PACEART REMOTE DEVICE CHECK
Battery Remaining Longevity: 41 mo
Battery Remaining Percentage: 35 %
Battery Voltage: 2.96 V
Brady Statistic AP VP Percent: 90 %
Brady Statistic AP VS Percent: 1 %
Brady Statistic AS VP Percent: 10 %
Brady Statistic AS VS Percent: 1 %
Brady Statistic RA Percent Paced: 1 %
Brady Statistic RV Percent Paced: 99 %
Date Time Interrogation Session: 20221110094000
Implantable Lead Implant Date: 20170202
Implantable Lead Implant Date: 20170202
Implantable Lead Location: 753859
Implantable Lead Location: 753860
Implantable Pulse Generator Implant Date: 20170202
Lead Channel Impedance Value: 380 Ohm
Lead Channel Impedance Value: 400 Ohm
Lead Channel Pacing Threshold Amplitude: 0.875 V
Lead Channel Pacing Threshold Amplitude: 1.25 V
Lead Channel Pacing Threshold Pulse Width: 0.4 ms
Lead Channel Pacing Threshold Pulse Width: 0.4 ms
Lead Channel Sensing Intrinsic Amplitude: 1.1 mV
Lead Channel Sensing Intrinsic Amplitude: 8.5 mV
Lead Channel Setting Pacing Amplitude: 1.125
Lead Channel Setting Pacing Amplitude: 2.5 V
Lead Channel Setting Pacing Pulse Width: 0.4 ms
Lead Channel Setting Sensing Sensitivity: 2 mV
Pulse Gen Model: 2240
Pulse Gen Serial Number: 7864186

## 2021-07-16 ENCOUNTER — Other Ambulatory Visit: Payer: Medicare Other

## 2021-07-16 ENCOUNTER — Other Ambulatory Visit: Payer: Self-pay

## 2021-07-16 DIAGNOSIS — Z79899 Other long term (current) drug therapy: Secondary | ICD-10-CM

## 2021-07-16 LAB — BASIC METABOLIC PANEL
BUN/Creatinine Ratio: 18 (ref 10–24)
BUN: 26 mg/dL (ref 10–36)
CO2: 28 mmol/L (ref 20–29)
Calcium: 8.8 mg/dL (ref 8.6–10.2)
Chloride: 96 mmol/L (ref 96–106)
Creatinine, Ser: 1.47 mg/dL — ABNORMAL HIGH (ref 0.76–1.27)
Glucose: 97 mg/dL (ref 70–99)
Potassium: 4.6 mmol/L (ref 3.5–5.2)
Sodium: 138 mmol/L (ref 134–144)
eGFR: 45 mL/min/{1.73_m2} — ABNORMAL LOW (ref >59)

## 2021-07-18 NOTE — Progress Notes (Signed)
Remote pacemaker transmission.   

## 2021-07-24 ENCOUNTER — Telehealth: Payer: Self-pay

## 2021-07-24 NOTE — Telephone Encounter (Signed)
**Note De-Identified Sigfredo Schreier Obfuscation** The pts completed BMSPAF application for Eliquis was left at the office without documents.  I printed a copy of the pts ins cards and his Med/allergy list and added them with his application. I completed the providers page f his application and havee-mailed it to Dr Alan Ripper nurse so she can obtain Dr Alan Ripper signature, date it, and to fax all to [facility] at the fax number written on the cover letter included or to place in the to be faxed basket in Medical Records to be faxed.

## 2021-07-30 NOTE — Telephone Encounter (Signed)
Forms signed and returned to Med Rec to be faxed and scanned.

## 2021-07-31 ENCOUNTER — Emergency Department (HOSPITAL_COMMUNITY)
Admission: EM | Admit: 2021-07-31 | Discharge: 2021-07-31 | Discharge status: Against Medical Advice | Payer: Medicare Other | Source: Home / Self Care

## 2021-07-31 ENCOUNTER — Other Ambulatory Visit: Payer: Self-pay

## 2021-07-31 ENCOUNTER — Encounter (HOSPITAL_BASED_OUTPATIENT_CLINIC_OR_DEPARTMENT_OTHER): Payer: Self-pay | Admitting: *Deleted

## 2021-07-31 ENCOUNTER — Emergency Department (HOSPITAL_BASED_OUTPATIENT_CLINIC_OR_DEPARTMENT_OTHER)
Admission: EM | Admit: 2021-07-31 | Discharge: 2021-08-01 | Disposition: A | Discharge status: Home / Self Care | Payer: Medicare Other | Attending: Emergency Medicine | Admitting: Emergency Medicine

## 2021-07-31 DIAGNOSIS — R600 Localized edema: Secondary | ICD-10-CM | POA: Diagnosis not present

## 2021-07-31 DIAGNOSIS — Z79899 Other long term (current) drug therapy: Secondary | ICD-10-CM | POA: Diagnosis not present

## 2021-07-31 DIAGNOSIS — Z951 Presence of aortocoronary bypass graft: Secondary | ICD-10-CM | POA: Insufficient documentation

## 2021-07-31 DIAGNOSIS — N182 Chronic kidney disease, stage 2 (mild): Secondary | ICD-10-CM | POA: Diagnosis not present

## 2021-07-31 DIAGNOSIS — R319 Hematuria, unspecified: Secondary | ICD-10-CM | POA: Diagnosis present

## 2021-07-31 DIAGNOSIS — J45909 Unspecified asthma, uncomplicated: Secondary | ICD-10-CM | POA: Diagnosis not present

## 2021-07-31 DIAGNOSIS — Z7901 Long term (current) use of anticoagulants: Secondary | ICD-10-CM | POA: Diagnosis not present

## 2021-07-31 DIAGNOSIS — I2510 Atherosclerotic heart disease of native coronary artery without angina pectoris: Secondary | ICD-10-CM | POA: Diagnosis not present

## 2021-07-31 DIAGNOSIS — Z8546 Personal history of malignant neoplasm of prostate: Secondary | ICD-10-CM | POA: Diagnosis not present

## 2021-07-31 DIAGNOSIS — I130 Hypertensive heart and chronic kidney disease with heart failure and stage 1 through stage 4 chronic kidney disease, or unspecified chronic kidney disease: Secondary | ICD-10-CM | POA: Insufficient documentation

## 2021-07-31 DIAGNOSIS — Z7984 Long term (current) use of oral hypoglycemic drugs: Secondary | ICD-10-CM | POA: Diagnosis not present

## 2021-07-31 DIAGNOSIS — I480 Paroxysmal atrial fibrillation: Secondary | ICD-10-CM | POA: Diagnosis not present

## 2021-07-31 DIAGNOSIS — Z87891 Personal history of nicotine dependence: Secondary | ICD-10-CM | POA: Insufficient documentation

## 2021-07-31 DIAGNOSIS — R310 Gross hematuria: Secondary | ICD-10-CM | POA: Insufficient documentation

## 2021-07-31 DIAGNOSIS — Z85820 Personal history of malignant melanoma of skin: Secondary | ICD-10-CM | POA: Diagnosis not present

## 2021-07-31 DIAGNOSIS — I5042 Chronic combined systolic (congestive) and diastolic (congestive) heart failure: Secondary | ICD-10-CM | POA: Insufficient documentation

## 2021-07-31 LAB — COMPREHENSIVE METABOLIC PANEL
ALT: 17 U/L (ref 0–44)
AST: 23 U/L (ref 15–41)
Albumin: 3.9 g/dL (ref 3.5–5.0)
Alkaline Phosphatase: 119 U/L (ref 38–126)
Anion gap: 8 (ref 5–15)
BUN: 27 mg/dL — ABNORMAL HIGH (ref 8–23)
CO2: 31 mmol/L (ref 22–32)
Calcium: 8.6 mg/dL — ABNORMAL LOW (ref 8.9–10.3)
Chloride: 98 mmol/L (ref 98–111)
Creatinine, Ser: 1.61 mg/dL — ABNORMAL HIGH (ref 0.61–1.24)
GFR, Estimated: 40 mL/min — ABNORMAL LOW (ref >60)
Glucose, Bld: 111 mg/dL — ABNORMAL HIGH (ref 70–99)
Potassium: 3.6 mmol/L (ref 3.5–5.1)
Sodium: 137 mmol/L (ref 135–145)
Total Bilirubin: 0.8 mg/dL (ref 0.3–1.2)
Total Protein: 8.6 g/dL — ABNORMAL HIGH (ref 6.5–8.1)

## 2021-07-31 LAB — URINALYSIS, ROUTINE W REFLEX MICROSCOPIC

## 2021-07-31 LAB — CBC WITH DIFFERENTIAL/PLATELET
Abs Immature Granulocytes: 0.05 10*3/uL (ref 0.00–0.07)
Basophils Absolute: 0 10*3/uL (ref 0.0–0.1)
Basophils Relative: 0 %
Eosinophils Absolute: 0.3 10*3/uL (ref 0.0–0.5)
Eosinophils Relative: 3 %
HCT: 46.1 % (ref 39.0–52.0)
Hemoglobin: 14.9 g/dL (ref 13.0–17.0)
Immature Granulocytes: 1 %
Lymphocytes Relative: 12 %
Lymphs Abs: 1.2 10*3/uL (ref 0.7–4.0)
MCH: 30.8 pg (ref 26.0–34.0)
MCHC: 32.3 g/dL (ref 30.0–36.0)
MCV: 95.4 fL (ref 80.0–100.0)
Monocytes Absolute: 0.8 10*3/uL (ref 0.1–1.0)
Monocytes Relative: 8 %
Neutro Abs: 7.7 10*3/uL (ref 1.7–7.7)
Neutrophils Relative %: 76 %
Platelets: 253 10*3/uL (ref 150–400)
RBC: 4.83 MIL/uL (ref 4.22–5.81)
RDW: 15.2 % (ref 11.5–15.5)
WBC: 10.1 10*3/uL (ref 4.0–10.5)
nRBC: 0 % (ref 0.0–0.2)

## 2021-07-31 LAB — URINALYSIS, MICROSCOPIC (REFLEX): RBC / HPF: >50 RBC/hpf (ref 0–5)

## 2021-07-31 NOTE — ED Provider Notes (Signed)
[facility] Provider Note   CSN: [id number] Arrival date & time: 07/31/21  1924     History Chief Complaint  Patient presents with   Hematuria    Levi [name] is a 87 y.o. male.  HPI     87yo male with history of htn, hlpd, CHF, CKD, CAD, pacemaker, afib on eliquis presents with concern for hematuria.    Began today, bright red bloo. Not passing clots. No difficulty urinating. Reports chronic frequent urination in setting of diuretic use but no new frequency, no dysuria.   No abdominal pain or flank pain. No lightheadedness.  Past Medical History:  Diagnosis Date   Asthma    Benign neoplasm of colon    CAD (coronary artery disease) Dec 2011   s/p CABG per Dr. Roxy Manns; had normal EF; All SVGs occluded per follow up cath with only LIMA to LAD patent; s/p PCI of the proximal and mid LAD February 2014 per Dr. Burt Knack   Chronic combined systolic and diastolic CHF (congestive heart failure) ([place])    CKD (chronic kidney disease), stage II    Emphysema    "never treated for it" (10/05/2015)   History of hiatal hernia    HLD (hyperlipidemia)    HTN (hypertension)    Malaise and fatigue    Obesity    Osteopenia    PAF (paroxysmal atrial fibrillation) ([place])    Pneumonia 1960s X 1; 1990s X 1   Presence of permanent cardiac pacemaker    Prostate cancer ([place])    Skin cancer    "face, nose, top of ears, scalp"   Symptomatic bradycardia    STJ PPM, 10/05/15, Dr. Lovena Le    Patient Active Problem List   Diagnosis Date Noted   Acute kidney injury superimposed on CKD ([place]) 03/14/2020   Hypokalemia 03/14/2020   Dizziness 03/17/2019   Acute on chronic combined systolic (congestive) and diastolic (congestive) heart failure ([place]) 03/15/2019   CKD (chronic kidney disease) stage 2, GFR 60-89 ml/min 10/09/2018   Ischemic cardiomyopathy 10/06/2018   Presence of permanent cardiac pacemaker 06/08/2018   Orthostatic hypotension 08/14/2017   Chronic  combined systolic and diastolic CHF (congestive heart failure) ([place]) 06/09/2017   Obesity (BMI 30-39.9) 02/19/2017   OSA (obstructive sleep apnea) 02/19/2017   Decreased hearing of both ears 12/24/2016   Depression, reactive 12/06/2016   Mobitz type 2 second degree atrioventricular block 10/05/2015   Chronic anticoagulation 09/19/2015   Bifascicular block 09/19/2015   Bradycardia 09/19/2015   Syncope 06/30/2014   Lumbosacral spondylosis 02/21/2014   Personal history of nicotine dependence 10/29/2013   Personal history of tobacco use, presenting hazards to health 10/29/2013   H/O coronary artery bypass surgery 10/22/2012   Angina, class III ([place]) 10/15/2012   CA of prostate ([place]) 07/09/2012   Gastro-esophageal reflux disease with esophagitis 07/09/2012   Allergic rhinitis 06/15/2012   Back pain 10/24/2011   CAD (coronary artery disease) 06/05/2011   Chronic coronary artery disease 06/05/2011   PAF (paroxysmal atrial fibrillation) ([place]) 09/20/2010   PROSTATE CANCER 12/27/2009   ADENOMATOUS COLONIC POLYP 12/27/2009   HLD (hyperlipidemia) 12/27/2009   Morbid obesity ([place]) 12/27/2009   Essential hypertension 12/27/2009   EMPHYSEMA 12/27/2009   OSTEOPENIA 12/27/2009   MALAISE AND FATIGUE 12/27/2009   Heart failure with preserved ejection fraction ([place]) 12/27/2009   CHEST PAIN UNSPECIFIED 12/27/2009   History of colonic polyps 12/04/2009    Past Surgical History:  Procedure Laterality Date   ANTERIOR CERVICAL DECOMP/DISCECTOMY FUSION  BACK SURGERY     C-EYE SURGERY PROCEDURE     CATARACT EXTRACTION Right    CORONARY ANGIOPLASTY WITH STENT PLACEMENT  10/14/12   with stent to proximal and mid LAD   CORONARY ARTERY BYPASS GRAFT  08/23/10   "CABG X4"   EP IMPLANTABLE DEVICE N/A 10/05/2015   Procedure: Pacemaker Implant;  Surgeon: [name], MD;  Location: [facility];  Service: Cardiovascular;  Laterality: N/A;   EP IMPLANTABLE DEVICE N/A 10/06/2015   Procedure: Lead  Revision/Repair;  Surgeon: [name], MD;  Location: [facility];  Service: Cardiovascular;  Laterality: N/A;   EYE SURGERY     INSERT / REPLACE / REMOVE PACEMAKER  10/05/2015   LEFT HEART CATHETERIZATION WITH CORONARY/GRAFT ANGIOGRAM N/A 09/01/2012   Procedure: LEFT HEART CATHETERIZATION WITH [name];  Surgeon: [name], MD;  Location[facility] C[facility] LAB;  Service: Cardiovascular;  Laterality: N/A;   MEDIAN STERNOTOMY  08/23/10   MOHS SURGERY  "@ least twice"   PACEMAKER INSERTION     STJ 10/05/15   PERCUTANEOUS CORONARY STENT INTERVENTION (PCI-S) N/A 10/14/2012   Procedure: PERCUTANEOUS CORONARY STENT INTERVENTION (PCI-S);  Surgeon: [name], MD;  Location: [facility] CATH LAB;  Service: Cardiovascular;  Laterality: N/A;   PROSTATE BIOPSY     TRANSURETHRAL RESECTION OF PROSTATE         Family History  Problem Relation Age of Onset   Kidney cancer Father    Heart attack Father    Hypertension Mother    Stroke Mother    Heart disease Brother    Skin cancer Brother    Prostate cancer Brother    Allergic rhinitis Neg Hx    Asthma Neg Hx    Eczema Neg Hx    Urticaria Neg Hx     Social History   Tobacco Use   Smoking status: Former    Packs/day: 1.00    Years: 30.00    Pack years: 30.00    Types: Cigarettes    Quit date: 09/07/1975    Years since quitting: 45.9   Smokeless tobacco: Former    Types: Chew    Quit date: 07/27/2015  Vaping Use   Vaping Use: Never used  Substance Use Topics   Alcohol use: No    Alcohol/week: 0.0 standard drinks   Drug use: No    Home Medications Prior to Admission medications   Medication Sig Start Date End Date Taking? Authorizing Provider  amiodarone (PACERONE) 100 MG tablet Take 1 tablet (100 mg total) by mouth daily. 01/24/21   [name], MD  apixaban (ELIQUIS) 2.5 MG TABS tablet TAKE 1 TABLET BY MOUTH  TWICE DAILY 03/06/21   [name], MD  Artificial Tear Ointment (DRY EYES OP) Place 1 drop into both  eyes 3 (three) times daily as needed (for dryness).    [provider]  cetirizine (ZYRTEC) 10 MG tablet Take 10 mg by mouth daily as needed for allergies or rhinitis.     [provider]  empagliflozin (JARDIANCE) 10 MG TABS tablet Take 1 tablet (10 mg total) by mouth daily before breakfast. 01/26/21   [name], MD  levothyroxine (SYNTHROID) 112 MCG tablet Take 1 tablet (112 mcg total) by mouth daily before breakfast. 11/24/20   [name], MD  nitroGLYCERIN (NITROSTAT) 0.4 MG SL tablet Place 1 tablet (0.4 mg total) under the tongue every 5 (five) minutes x 3 doses as needed for chest pain. 03/22/20  Kathlen Mody, Scott T, PA-C  pantoprazole (PROTONIX) 40 MG tablet Take 1 tablet (40 mg total) by mouth at bedtime. 09/20/19   [name], MD  polyethylene glycol (MIRALAX / GLYCOLAX) 17 g packet Take 17 g by mouth daily.     [provider]  potassium chloride SA (KLOR-CON) 20 MEQ tablet Take 1.5 tablets (30 mEq total) by mouth daily. 06/15/21   Richardson Dopp T, PA-C  pravastatin (PRAVACHOL) 20 MG tablet TAKE 1 TABLET BY MOUTH  DAILY 08/29/20   [name], MD  spironolactone (ALDACTONE) 25 MG tablet Take 0.5 tablets (12.5 mg total) by mouth daily. 06/14/21   Richardson Dopp T, PA-C  tamsulosin (FLOMAX) 0.4 MG CAPS capsule Take 0.4 mg by mouth at bedtime.     [provider]  torsemide (DEMADEX) 20 MG tablet Take 3 tablets (60 mg total) by mouth 2 (two) times daily. 05/15/21   [name], MD    Allergies    Patient has no known allergies.  Review of Systems   Review of Systems  Constitutional:  Negative for fever.  Respiratory:  Negative for shortness of breath (no change in baseline).   Cardiovascular:  Negative for chest pain. Leg swelling: no change in baseline. Gastrointestinal:  Negative for abdominal pain, nausea and vomiting.  Genitourinary:  Positive for hematuria. Negative for difficulty urinating and dysuria. Frequency: no  change. Musculoskeletal:  Negative for back pain and neck stiffness.  Skin:  Negative for rash.  Neurological:  Negative for syncope and headaches.   Physical Exam Updated Vital Signs BP 134/71   Pulse 77   Temp 98.8 F (37.1 C)   Resp 16   Ht 5\' 9"  (1.753 m)   Wt 133.9 kg   SpO2 96%   BMI 43.59 kg/m   Physical Exam Vitals and nursing note reviewed.  Constitutional:      General: He is not in acute distress.    Appearance: He is well-developed. He is not diaphoretic.  HENT:     Head: Normocephalic and atraumatic.  Eyes:     Conjunctiva/sclera: Conjunctivae normal.  Cardiovascular:     Rate and Rhythm: Normal rate and regular rhythm.  Pulmonary:     Effort: Pulmonary effort is normal. No respiratory distress.  Abdominal:     General: There is no distension.     Palpations: Abdomen is soft.     Tenderness: There is no abdominal tenderness. There is no guarding.  Musculoskeletal:     Cervical back: Normal range of motion.     Right lower leg: Edema present.     Left lower leg: Edema present.  Skin:    General: Skin is warm and dry.  Neurological:     Mental Status: He is alert and oriented to person, place, and time.    ED Results / Procedures / Treatments   Labs (all labs ordered are listed, but only abnormal results are displayed) Labs Reviewed  URINALYSIS, ROUTINE W REFLEX MICROSCOPIC - Abnormal; Notable for the following components:      Result Value   Color, Urine RED (*)    APPearance TURBID (*)    Glucose, UA   (*)    Value: TEST NOT REPORTED DUE TO COLOR INTERFERENCE OF URINE PIGMENT   Hgb urine dipstick   (*)    Value: TEST NOT REPORTED DUE TO COLOR INTERFERENCE OF URINE PIGMENT   Bilirubin Urine   (*)    Value: TEST NOT REPORTED DUE TO COLOR INTERFERENCE OF  URINE PIGMENT   Ketones, ur   (*)    Value: TEST NOT REPORTED DUE TO COLOR INTERFERENCE OF URINE PIGMENT   Protein, ur   (*)    Value: TEST NOT REPORTED DUE TO COLOR INTERFERENCE OF URINE  PIGMENT   Nitrite   (*)    Value: TEST NOT REPORTED DUE TO COLOR INTERFERENCE OF URINE PIGMENT   Leukocytes,Ua   (*)    Value: TEST NOT REPORTED DUE TO COLOR INTERFERENCE OF URINE PIGMENT   All other components within normal limits  URINALYSIS, MICROSCOPIC (REFLEX) - Abnormal; Notable for the following components:   Bacteria, UA RARE (*)    All other components within normal limits  COMPREHENSIVE METABOLIC PANEL - Abnormal; Notable for the following components:   Glucose, Bld 111 (*)    BUN 27 (*)    Creatinine, Ser 1.61 (*)    Calcium 8.6 (*)    Total Protein 8.6 (*)    GFR, Estimated 40 (*)    All other components within normal limits  CBC WITH DIFFERENTIAL/PLATELET    EKG None  Radiology No results found.  Procedures Procedures   Medications Ordered in ED Medications - No data to display  ED Course  I have reviewed the triage vital signs and the nursing notes.  Pertinent labs & imaging results that were available during my care of the patient were reviewed by me and considered in my medical decision making (see chart for details).    MDM Rules/Calculators/A&P                            87yo male with history of htn, hlpd, CHF, CKD, CAD, pacemaker, afib on eliquis presents with concern for hematuria.  Normal hgb.  Labs without significant change. UA with blood but no signs of infection.  No abdominal pain. No hx to suggest obstruction.  Normal vital signs.  Reports urine improving some on repeat urination here.  At this time, feel benefits of eliquis outweigh risks of bleeding however discussed if symptoms continue this may be discontinued in conversation with cardiology/urology or PCP. Recommend follow up with urology. Patient discharged in stable condition with understanding of reasons to return.   Final Clinical Impression(s) / ED Diagnoses Final diagnoses:  Gross hematuria    Rx / DC Orders ED Discharge Orders     None        Gareth Morgan,  MD 08/01/21 1201

## 2021-07-31 NOTE — ED Triage Notes (Signed)
Bright red blood in his urine x 2 hours. No pain. No hx of same.

## 2021-07-31 NOTE — ED Notes (Signed)
Pt reports hematuria since earlier today. Denies abd pain, back/flank pain, urinary frequency, dysuria, fevers, n/v.

## 2021-08-01 ENCOUNTER — Telehealth: Payer: Self-pay | Admitting: Internal Medicine

## 2021-08-01 NOTE — Telephone Encounter (Signed)
[name], (patient's daughter) called wanting to let us know that she called the company about the forms and they told her that not all of the pages came through.  She would like for [place] to refax all 4 pages back to them.

## 2021-08-01 NOTE — Telephone Encounter (Signed)
Patient calling the office for samples of medication:   1.  What medication and dosage are you requesting samples for? Eliquis  2.  Are you currently out of this medication? Has two pills left     Pt is waiting to see if hes been approved for free samples... the manf. Is stating that they didn't receive all four pages of the form and needs them faxed over.. while pt is waiting on approval he would like to know if he is able to receive more samples... please advise

## 2021-08-01 NOTE — Telephone Encounter (Signed)
Previous message sent to [employer] to Refax the pt assistance paperwork...   Will leave in triage to see if we can leave samples at the front for the pt. tomorrow 08/02/21. After hours and unable to obtain samples.

## 2021-08-02 NOTE — Telephone Encounter (Signed)
Daughter of the patient called. She was calling to see if the office had any samples of Eliquis

## 2021-08-02 NOTE — Telephone Encounter (Signed)
Spoke with pt's daughter,Karen,DPR.  Pt was seen in ED this week for gross hematuria.  Pt's reports no visible blood this morning according to daughter.  Pt's daughter is asking for additional samples of Eliquis as they are still awaiting approval from the patient assistance program.   Daughter advised will forward to Dr Harrington Challenger to make her aware of hematuria and permission to provide samples of Eliquis for pt.  Pt's daughter verbalizes understanding and agrees with current plan.

## 2021-08-03 ENCOUNTER — Telehealth: Payer: Self-pay | Admitting: Internal Medicine

## 2021-08-03 NOTE — Telephone Encounter (Signed)
**Note De-Identified Alishia Lebo Obfuscation** I called [employer] Pt Asst and s/w [place] who advised me that they did not receive an application for this pt.  I will re-e-mail his application to Dr Alan Ripper nurse so she can obtain Dr Alan Ripper signature, date it, and to fax it to [facility] at the fax number written on the cover letter.  I called the pts daughter, [name] back and she states that [employer] advised her the first time she called them to check the progress of the pts application for Eliquis that they only received a couple pages of his application so she called to let us know. At that time she states that someone from the office re-faxed the pts entire application to them again. When she called a few days later she was advised that they received the pts entire application and that it would take 5 to 7 days for a determination and she has not heard anything about it since 11/30.  She is aware that Dr Alan Ripper nurse will re-fax the pts application to [facility] on Monday 12/05 and that we are leaving the pt 2 weeks of Eliquis samples in the front office to pick up.  [name] thanked me for all of our assistance on this.

## 2021-08-03 NOTE — Telephone Encounter (Signed)
Can you guys please assist with this matter. Thanks

## 2021-08-03 NOTE — Telephone Encounter (Signed)
Patient calling the office for samples of medication:   1.  What medication and dosage are you requesting samples for? apixaban (ELIQUIS) 2.5 MG TABS tablet  2.  Are you currently out of this medication? Pt only have enough for today. Daughter wants to know if pt can get a 30 day supply or until she hears back from our office

## 2021-08-03 NOTE — Telephone Encounter (Signed)
Levi [name],  Please re-fax the pts [employer] application to them again .  I have e-mailed it to you incase you dont have the first one.   Cathy,  Please leave the pt 2 weeks of Eliquis 2.5 mg samples up front.  He is Dr Alan Ripper pt.  Thanks Guys:).   Medical records:   This was placed in the nurse fax box in Med rec previously.... can you refax for [name], they are saying they never got it. I am out of the office.   I will email to [place] RN if you need it again.

## 2021-08-03 NOTE — Telephone Encounter (Addendum)
Leaving pt 2 boxes of Eliquis 2.5 mg tablet at the front desk for pt to pick up.  Lot# [id number]   Exp: 01/2022

## 2021-08-06 NOTE — Telephone Encounter (Signed)
Forms in Medical Records had confirmation that the forms had transmitted previously but will go ahead and refax forms to be sure they are received for the pt assistance.

## 2021-08-09 NOTE — Telephone Encounter (Signed)
**Note De-Identified Slater Mcmanaman Obfuscation** Letter received from [facility] [employer] fax stating that they have denied the pt asst with Eliquis. Reason: Documentation of 3% out of pocket RX expenses, based on household adjusted gross income, not met.  The letter states that they have notified the pt of this denial as well.

## 2021-08-13 ENCOUNTER — Ambulatory Visit (INDEPENDENT_AMBULATORY_CARE_PROVIDER_SITE_OTHER): Payer: Medicare Other

## 2021-08-13 ENCOUNTER — Other Ambulatory Visit: Payer: Self-pay

## 2021-08-13 DIAGNOSIS — Z23 Encounter for immunization: Secondary | ICD-10-CM | POA: Diagnosis not present

## 2021-08-14 NOTE — Telephone Encounter (Signed)
Letter received from [facility] stating that they have approved pt asst for Eliquis from 08/14/21 - 09/01/21.   Application Case #: [id number]

## 2021-08-20 NOTE — Telephone Encounter (Signed)
Received fax from [place] stating that pt's application has been approved free of charge from 08/14/2021-09/01/2021.

## 2021-08-30 ENCOUNTER — Other Ambulatory Visit: Payer: Self-pay | Admitting: Internal Medicine

## 2021-09-05 ENCOUNTER — Emergency Department (HOSPITAL_BASED_OUTPATIENT_CLINIC_OR_DEPARTMENT_OTHER): Payer: Medicare Other

## 2021-09-05 ENCOUNTER — Encounter (HOSPITAL_BASED_OUTPATIENT_CLINIC_OR_DEPARTMENT_OTHER): Payer: Self-pay | Admitting: Emergency Medicine

## 2021-09-05 ENCOUNTER — Other Ambulatory Visit: Payer: Self-pay

## 2021-09-05 ENCOUNTER — Emergency Department (HOSPITAL_BASED_OUTPATIENT_CLINIC_OR_DEPARTMENT_OTHER)
Admission: EM | Admit: 2021-09-05 | Discharge: 2021-09-05 | Disposition: A | Discharge status: Home / Self Care | Payer: Medicare Other | Attending: Emergency Medicine | Admitting: Emergency Medicine

## 2021-09-05 DIAGNOSIS — M545 Low back pain, unspecified: Secondary | ICD-10-CM | POA: Diagnosis present

## 2021-09-05 DIAGNOSIS — R310 Gross hematuria: Secondary | ICD-10-CM | POA: Diagnosis not present

## 2021-09-05 DIAGNOSIS — Z7901 Long term (current) use of anticoagulants: Secondary | ICD-10-CM | POA: Insufficient documentation

## 2021-09-05 LAB — CBC WITH DIFFERENTIAL/PLATELET
Abs Immature Granulocytes: 0.02 10*3/uL (ref 0.00–0.07)
Basophils Absolute: 0 10*3/uL (ref 0.0–0.1)
Basophils Relative: 0 %
Eosinophils Absolute: 0.3 10*3/uL (ref 0.0–0.5)
Eosinophils Relative: 3 %
HCT: 43.9 % (ref 39.0–52.0)
Hemoglobin: 14 g/dL (ref 13.0–17.0)
Immature Granulocytes: 0 %
Lymphocytes Relative: 13 %
Lymphs Abs: 1.1 10*3/uL (ref 0.7–4.0)
MCH: 30.4 pg (ref 26.0–34.0)
MCHC: 31.9 g/dL (ref 30.0–36.0)
MCV: 95.4 fL (ref 80.0–100.0)
Monocytes Absolute: 0.8 10*3/uL (ref 0.1–1.0)
Monocytes Relative: 9 %
Neutro Abs: 6.2 10*3/uL (ref 1.7–7.7)
Neutrophils Relative %: 75 %
Platelets: 219 10*3/uL (ref 150–400)
RBC: 4.6 MIL/uL (ref 4.22–5.81)
RDW: 16.3 % — ABNORMAL HIGH (ref 11.5–15.5)
WBC: 8.3 10*3/uL (ref 4.0–10.5)
nRBC: 0 % (ref 0.0–0.2)

## 2021-09-05 LAB — BASIC METABOLIC PANEL
Anion gap: 10 (ref 5–15)
BUN: 37 mg/dL — ABNORMAL HIGH (ref 8–23)
CO2: 27 mmol/L (ref 22–32)
Calcium: 9 mg/dL (ref 8.9–10.3)
Chloride: 100 mmol/L (ref 98–111)
Creatinine, Ser: 2.1 mg/dL — ABNORMAL HIGH (ref 0.61–1.24)
GFR, Estimated: 29 mL/min — ABNORMAL LOW (ref >60)
Glucose, Bld: 100 mg/dL — ABNORMAL HIGH (ref 70–99)
Potassium: 4.1 mmol/L (ref 3.5–5.1)
Sodium: 137 mmol/L (ref 135–145)

## 2021-09-05 LAB — URINALYSIS, ROUTINE W REFLEX MICROSCOPIC
Bilirubin Urine: NEGATIVE
Glucose, UA: 250 mg/dL — AB
Hgb urine dipstick: NEGATIVE
Ketones, ur: NEGATIVE mg/dL
Leukocytes,Ua: NEGATIVE
Nitrite: NEGATIVE
Specific Gravity, Urine: 1.012 (ref 1.005–1.030)
pH: 5.5 (ref 5.0–8.0)

## 2021-09-05 MED ORDER — ACETAMINOPHEN 325 MG PO TABS
650.0000 mg | ORAL_TABLET | Freq: Once | ORAL | Status: AC
  Administered 2021-09-05: 650 mg via ORAL
  Filled 2021-09-05: qty 2

## 2021-09-05 MED ORDER — METHOCARBAMOL 500 MG PO TABS: 500.0000 mg | ORAL_TABLET | Freq: Two times a day (BID) | ORAL | 0 refills | Status: DC

## 2021-09-05 MED ORDER — METHOCARBAMOL 500 MG PO TABS
500.0000 mg | ORAL_TABLET | Freq: Once | ORAL | Status: AC
  Administered 2021-09-05: 500 mg via ORAL
  Filled 2021-09-05: qty 1

## 2021-09-05 NOTE — ED Provider Notes (Signed)
[facility] Provider Note   CSN: [id number] Arrival date & time: 09/05/21 1524  History Chief Complaint  Patient presents with   Flank Pain    right    Levi [name] is a 88 y.o. male on eliquis had some gross hematuria about 4-6 weeks ago that has since resolved. In the last several days he has had moderate to severe R lower back pain, non radiating but worse with movement and walking. He went to PCP office and was given a toradol injection with good relief and sent to the ED for evaluation. Denies any recent falls or injuries. No fever, incontinence or leg numbness/weakness.    Home Medications Prior to Admission medications   Medication Sig Start Date End Date Taking? Authorizing Provider  methocarbamol (ROBAXIN) 500 MG tablet Take 1 tablet (500 mg total) by mouth 2 (two) times daily. 09/05/21  Yes Truddie Hidden, MD  amiodarone (PACERONE) 100 MG tablet Take 1 tablet (100 mg total) by mouth daily. 01/24/21   [name], MD  apixaban (ELIQUIS) 2.5 MG TABS tablet TAKE 1 TABLET BY MOUTH  TWICE DAILY 03/06/21   [name], MD  Artificial Tear Ointment (DRY EYES OP) Place 1 drop into both eyes 3 (three) times daily as needed (for dryness).    [provider]  cetirizine (ZYRTEC) 10 MG tablet Take 10 mg by mouth daily as needed for allergies or rhinitis.     [provider]  empagliflozin (JARDIANCE) 10 MG TABS tablet Take 1 tablet (10 mg total) by mouth daily before breakfast. 01/26/21   [name], MD  levothyroxine (SYNTHROID) 112 MCG tablet Take 1 tablet (112 mcg total) by mouth daily before breakfast. 11/24/20   [name], MD  nitroGLYCERIN (NITROSTAT) 0.4 MG SL tablet Place 1 tablet (0.4 mg total) under the tongue every 5 (five) minutes x 3 doses as needed for chest pain. 03/22/20   Richardson Dopp T, PA-C  pantoprazole (PROTONIX) 40 MG tablet Take 1 tablet (40 mg total) by mouth at bedtime. 09/20/19   [name], MD   polyethylene glycol (MIRALAX / GLYCOLAX) 17 g packet Take 17 g by mouth daily.     [provider]  potassium chloride SA (KLOR-CON) 20 MEQ tablet Take 1.5 tablets (30 mEq total) by mouth daily. 06/15/21   Richardson Dopp T, PA-C  pravastatin (PRAVACHOL) 20 MG tablet TAKE 1 TABLET BY MOUTH  DAILY 08/31/21   [name], MD  spironolactone (ALDACTONE) 25 MG tablet Take 0.5 tablets (12.5 mg total) by mouth daily. 06/14/21   Richardson Dopp T, PA-C  tamsulosin (FLOMAX) 0.4 MG CAPS capsule Take 0.4 mg by mouth at bedtime.     [provider]  torsemide (DEMADEX) 20 MG tablet Take 3 tablets (60 mg total) by mouth 2 (two) times daily. 05/15/21   [name], MD     Allergies    Patient has no known allergies.   Review of Systems   Review of Systems Please see HPI for pertinent positives and negatives  Physical Exam BP (!) 169/84    Pulse 71    Temp (!) 97.3 F (36.3 C)    Resp 19    Wt 129.7 kg    SpO2 93%    BMI 42.23 kg/m   Physical Exam Vitals and nursing note reviewed.  Constitutional:      Appearance: Normal appearance.  HENT:     Head: Normocephalic and atraumatic.  Nose: Nose normal.     Mouth/Throat:     Mouth: Mucous membranes are moist.  Eyes:     Extraocular Movements: Extraocular movements intact.     Conjunctiva/sclera: Conjunctivae normal.  Cardiovascular:     Rate and Rhythm: Normal rate.  Pulmonary:     Effort: Pulmonary effort is normal.     Breath sounds: Normal breath sounds.  Abdominal:     General: Abdomen is flat.     Palpations: Abdomen is soft.     Tenderness: There is no abdominal tenderness.  Musculoskeletal:        General: Tenderness (R lumbar paraspinal muscles, no tenderness at sciatic notch or midline spine.) present. No swelling. Normal range of motion.     Cervical back: Neck supple.  Skin:    General: Skin is warm and dry.  Neurological:     General: No focal deficit present.     Mental Status: He is alert.   Psychiatric:        Mood and Affect: Mood normal.    ED Results / Procedures / Treatments   EKG None  Procedures Procedures  Medications Ordered in the ED Medications  methocarbamol (ROBAXIN) tablet 500 mg (500 mg Oral Given 09/05/21 2101)  acetaminophen (TYLENOL) tablet 650 mg (650 mg Oral Given 09/05/21 2059)    Initial Impression and Plan  Patient sent from PCP office with R flank pain, concern for kidney stone. Labs done in triage showed normal CBC, BMP shows mildly increased Cr from baseline (given Toradol at PCP office today). CT is neg for renal stone or other acute process. UA is neg for blood or infection. Will avoid further NSAIDs given his Cr. Will try APAP and Robaxin for MSK pain and reassess.   ED Course   Clinical Course as of 09/05/21 2140  Wed Sep 05, 2021  2137 Pain improved some, patient able to stand and walk to the bathroom and back. Plan discharge with Rx for Robaxin, avoid NSAIDs, PCP follow up.  [CS]    Clinical Course User Index [CS] Truddie Hidden, MD     MDM Rules/Calculators/A&P Medical Decision Making Problems Addressed: Acute right-sided low back pain without sciatica: acute illness or injury  Amount and/or Complexity of Data Reviewed Labs: ordered. Decision-making details documented in ED Course. Radiology: ordered and independent interpretation performed. Decision-making details documented in ED Course.  Risk Prescription drug management.    Final Clinical Impression(s) / ED Diagnoses Final diagnoses:  Acute right-sided low back pain without sciatica    Rx / DC Orders ED Discharge Orders          Ordered    methocarbamol (ROBAXIN) 500 MG tablet  2 times daily        09/05/21 2140             Truddie Hidden, MD 09/05/21 2140

## 2021-09-05 NOTE — ED Triage Notes (Signed)
Right flank pain x 1 week , hematuria 1 month , no today . No kidney stone in past .  Denies NVD

## 2021-09-13 ENCOUNTER — Telehealth: Payer: Self-pay

## 2021-09-13 NOTE — Telephone Encounter (Signed)
**Note De-Identified Caeli Linehan Obfuscation** The pts completed [employer] pt asst application for Jardiance was left at the office. I have completed the providers page of his application and have e-mailed all of it to Dr Alan Ripper nurse so she can obtain her signature when she is in the office next, date it, and to fax all to [place] at the fax number written on the cover letter included or to pace in the to be faxed basket in Medical Records to be faxed.

## 2021-09-24 NOTE — Telephone Encounter (Signed)
Forms for pt assistance to [facility] C[employer] placed in the nurse fax box in medical records. Signed and dated by Dr. Harrington Challenger.

## 2021-09-26 ENCOUNTER — Other Ambulatory Visit: Payer: Self-pay | Admitting: Internal Medicine

## 2021-09-27 ENCOUNTER — Encounter: Payer: Self-pay | Admitting: Internal Medicine

## 2021-09-27 MED ORDER — AMIODARONE HCL 100 MG PO TABS: 100.0000 mg | ORAL_TABLET | Freq: Every day | ORAL | 3 refills | Status: DC

## 2021-10-02 NOTE — Progress Notes (Signed)
Cardiology Office Note:    Date:  10/03/2021   ID:  Levi [name], Levi [name] 27-Feb-1929, MRN [id number]  PCP:  Levi Limbo, MD  Cardiologist:  Levi Carnes, MD  Electrophysiologist:  Levi Peru, MD   Referring MD: Levi Limbo, MD   Chief Complaint:  F/U of CHF    Patient Profile:    Levi [name] is a 88 y.o. male with:  Coronary artery disease  S/p CABG in 2011  S/p DES x 2 to LAD in 2013 LHC at [facility] in 6/18: patent LIMA-LAD, diff dz elsewhere.  All VGs occluded.  No targets for PCI >> medical Rx recommended   Parox AFib CHADS2-VASc=5 (age x 2, HTN, CAD, CHF) >> Apixaban 5 mg  Amiodarone Rx started in 10/2019 due to increased AF burden Symptomatic bradycardia, Mobitz 2 S/p Pacemaker Jan 8937 Combined systolic and diastolic CHF Echocardiogram 12/19: EF 40-45 (previously normal) >> Med Rx Admx 10/2018 w/ DHF Decomp HF 12/2018 managed as OP with Metolazone Admx with Decomp CHF 03/2019 Hypertension  Hyperlipidemia  Chronic kidney disease stage 2  OSA on CPAP Morbid obesity  Prior CV studies: Echo July 2021 1. Left ventricular ejection fraction, by estimation, is 45 to 50%. The left ventricle has mildly decreased function. The left ventricle demonstrates regional wall motion abnormalities (see scoring diagram/findings for description). There is mild concentric left ventricular hypertrophy. Left ventricular diastolic parameters are consistent with Grade II diastolic dysfunction (pseudonormalization). 2. Right ventricular systolic function is normal. The right ventricular size is normal. There is mildly elevated pulmonary artery systolic pressure. 3. The mitral valve is normal in structure. Trivial mitral valve regurgitation. No evidence of mitral stenosis. 4. Tricuspid valve regurgitation is mild to moderate. 5. The aortic valve is tricuspid. Aortic valve regurgitation is not visualized. No aortic stenosis is present. 6. The inferior vena cava is dilated in size  with >50% respiratory variability, suggesting right atrial pressure of 8 mmHg.   Echo 08/28/18 Mild conc LVH, EF 40-45, inf/inf-sept/apical HK, Gr 2 DD, MAC, normal RVSF, mod RAE, increased RA pressure, mild TR   Cardiac catheterization 02/10/2017 ([facility]) Coronary angiography reveals 3-vessel CAD.  LM is diffusely diseased with 50% distal stenosis.   LAD has a 80% mid instent restenosis,  LCX has serial 50-60% stenoses in the mid segment, OM2 is a small vessel and has a 70% ostial stenosis.  RCA has long diffuse 50-60% proximal and 60% distal stenosis.  LIMA to LAD is patent with a kink in the mid segment. Flow is normal.  LV-Gram reveals EF 50%. LVEDP is 8 mmHg.  SVG grafts are known to be chronically occluded, so not injected    Nuclear stress test 01/21/2017 ([facility]) 1.  Moderate to severe inferoapical inducible ischemia with regadenoson 2.  Mild to moderate mid inferior and posterior basal defect. Cannot rule out prior infarct. 3.  Mild to moderate hypokinesis of the inferior wall. Dyskinesia/akinesia of the apex. 4.  Moderately decreased left ventricular ejection fraction at 43%. 5.  Please correlate with a cardiac ultrasound    Carotid US 09/14/2015 Bilateral ICA 1-39; partial left subclavian steal >> follow-up as needed     Echo (10/15):   Mild LVH, EF 55-60%, no RWMA, mod LAE, mild TR, PASP 42 mmHg     LHC (12/13):   Dist LM 60-70%, ostial LAD 40-50%, D1 occluded, prox LAD 80% then aneurysmal segment then 80%, ostial septal perforator 99%, prox CFX 50%, OM branch serial  60% lesions, prox RCA 30%, PL Br 50%; LIMA-LAD patent, SVG-LAD occluded, SVG-OM occluded, SVG-PDA occluded >> Med Rx >> continued angina >>  PCI (2/14):  2.25 x 32 mm Promus DES and 2.5 x 20 mm Promus DES to prox and mid LAD  History of Present Illness:    Mr Levi [name] is an 88 yo with a hx of CAD (s/p CABG, s/p stent), CHF, morbid obesity, HTN, HL, PAF  and CKD  The pt was last seen in cardiology by Levi [name] in Oct 2022   Aldactone was added at that visit  The pt says his breathing is about the same.   Still winded with activity    Denies CP   No dizziness Girlfriend says she rinses canned veggies.   Says he still likes his sweets.     Past Medical History:  Diagnosis Date   Asthma    Benign neoplasm of colon    CAD (coronary artery disease) Dec 2011   s/p CABG per Dr. Roxy [name]; had normal EF; All SVGs occluded per follow up cath with only LIMA to LAD patent; s/p PCI of the proximal and mid LAD February 2014 per Dr. Burt [name]   Chronic combined systolic and diastolic CHF (congestive heart failure) ([place])    CKD (chronic kidney disease), stage II    Emphysema    "never treated for it" (10/05/2015)   History of hiatal hernia    HLD (hyperlipidemia)    HTN (hypertension)    Malaise and fatigue    Obesity    Osteopenia    PAF (paroxysmal atrial fibrillation) (HCC)    Pneumonia 1960s X 1; 1990s X 1   Presence of permanent cardiac pacemaker    Prostate cancer ([place])    Skin cancer    "face, nose, top of ears, scalp"   Symptomatic bradycardia    STJ PPM, 10/05/15, Dr. Lovena [name]    Current Medications: Current Meds  Medication Sig   amiodarone (PACERONE) 100 MG tablet Take 1 tablet (100 mg total) by mouth daily.   amLODipine (NORVASC) 2.5 MG tablet Take 2.5 mg by mouth daily.   apixaban (ELIQUIS) 2.5 MG TABS tablet TAKE 1 TABLET BY MOUTH  TWICE DAILY   Artificial Tear Ointment (DRY EYES OP) Place 1 drop into both eyes 3 (three) times daily as needed (for dryness).   carvedilol (COREG) 6.25 MG tablet Take 6.125 mg by mouth daily.   cetirizine (ZYRTEC) 10 MG tablet Take 10 mg by mouth daily as needed for allergies or rhinitis.    empagliflozin (JARDIANCE) 10 MG TABS tablet Take 1 tablet (10 mg total) by mouth daily before breakfast.   furosemide (LASIX) 20 MG tablet Take 20 mg by mouth daily.   furosemide (LASIX) 40 MG tablet Take by mouth.    hydrochlorothiazide (HYDRODIURIL) 25 MG tablet Take by mouth.   isosorbide mononitrate (IMDUR) 60 MG 24 hr tablet Take by mouth.   levothyroxine (SYNTHROID) 112 MCG tablet Take 1 tablet (112 mcg total) by mouth daily before breakfast.   losartan (COZAAR) 25 MG tablet Take by mouth.   methocarbamol (ROBAXIN) 500 MG tablet Take 1 tablet (500 mg total) by mouth 2 (two) times daily.   nitroGLYCERIN (NITROSTAT) 0.4 MG SL tablet Place 1 tablet (0.4 mg total) under the tongue every 5 (five) minutes x 3 doses as needed for chest pain.   pantoprazole (PROTONIX) 40 MG tablet Take 1 tablet (40 mg total) by mouth at bedtime.   polyethylene glycol (MIRALAX / GLYCOLAX) 17  g packet Take 17 g by mouth daily.    potassium chloride SA (KLOR-CON) 20 MEQ tablet Take 1.5 tablets (30 mEq total) by mouth daily.   pravastatin (PRAVACHOL) 20 MG tablet TAKE 1 TABLET BY MOUTH  DAILY   spironolactone (ALDACTONE) 25 MG tablet Take 0.5 tablets (12.5 mg total) by mouth daily.   tamsulosin (FLOMAX) 0.4 MG CAPS capsule Take 0.4 mg by mouth at bedtime.    torsemide (DEMADEX) 20 MG tablet Take 3 tablets (60 mg total) by mouth 2 (two) times daily.     Allergies:   Patient has no known allergies.   Social History   Tobacco Use   Smoking status: Former    Packs/day: 1.00    Years: 30.00    Pack years: 30.00    Types: Cigarettes    Quit date: 09/07/1975    Years since quitting: 46.1   Smokeless tobacco: Former    Types: Chew    Quit date: 07/27/2015  Vaping Use   Vaping Use: Never used  Substance Use Topics   Alcohol use: No    Alcohol/week: 0.0 standard drinks   Drug use: No     Family Hx: The patient's family history includes Heart attack in his father; Heart disease in his brother; Hypertension in his mother; Kidney cancer in his father; Prostate cancer in his brother; Skin cancer in his brother; Stroke in his mother. There is no history of Allergic rhinitis, Asthma, Eczema, or Urticaria.  ROS  See  HPI  EKGs/Labs/Other Test Reviewed:    EKG:  EKG is ordered today.  AV paced   Recent Labs: 05/11/2021: NT-Pro BNP 1,997; TSH 3.500 07/31/2021: ALT 17 09/05/2021: BUN 37; Creatinine, Ser 2.10; Hemoglobin 14.0; Platelets 219; Potassium 4.1; Sodium 137   Recent Lipid Panel Lab Results  Component Value Date/Time   CHOL 101 11/21/2020 09:19 AM   TRIG 89 11/21/2020 09:19 AM   HDL 37 (L) 11/21/2020 09:19 AM   CHOLHDL 2.7 11/21/2020 09:19 AM   CHOLHDL 3 06/01/2014 09:30 AM   LDLCALC 46 11/21/2020 09:19 AM   LDLDIRECT 70.7 06/10/2013 01:44 PM    Physical Exam:    VS:  BP 138/68 (BP Location: Left Arm, Patient Position: Sitting, Cuff Size: Normal)    Pulse 71    Resp 20    Ht _0  (1.753 m)    Wt 289 lb (131.1 kg)    SpO2 92%    BMI 42.68 kg/m     Wt Readings from Last 3 Encounters:  10/03/21 289 lb (131.1 kg)  09/05/21 286 lb (129.7 kg)  07/31/21 295 lb 3.1 oz (133.9 kg)    Morbidly obese 88 yo in NAD  Examined in chair NEck is full  JVP does not appear increased    Lungs Occasional rale R base   Cardiac RRR  NoS3  No signif murmurs Abd is distended  Ext Lower extremies with 1+ edema bilaterally Ankles are not tense    Chronic skin changes     ASSESSMENT & PLAN:    1. Chronic combined systolic and diastolic CHF (congestive heart failure) (HCC) Ischemic CM NYHA Class III Last echo in July 2021 showed EF 45-50 with moderate diastolic dysfunction Volume status actually is the best I have seen, though exam is dificult    His ankles are not tense.       I attrib some to more careful watching of salt (girlfriend rinsing veggies)     I would keep on same regimen  Check labs today BMET, BNP    2. Coronary artery disease involving native coronary artery of native heart with angina pectoris [facility]) s/p CABG in 2011; DES x2 to the LAD in 2013.  Cardiac catheterization in 2018 at [facility] demonstrated a patent LIMA-LAD and diffuse disease elsewhere.  All vein grafts were known to be  occluded.  No targets for PCI.  Pt denies CP   Chronic SOB unchanged       3. PAF (paroxysmal atrial fibrillation) (HCC)  Continue Eliquis and amiodarone   Pt with PPM      4.  HL   LDL 46  HDL 37 in march 2022    5  Hypothyroidism, unspecified type  TSH normal in September 6. CKD (chronic kidney disease) stage 2   Will check labs today     7. S/p PPM   Pt follows with [name] in EP     Medication Adjustments/Labs and Tests Ordered: Current medicines are reviewed at length with the patient today.  Concerns regarding medicines are outlined above.  Tests Ordered: Orders Placed This Encounter  Procedures   CBC   Basic Metabolic Panel (BMET)   EKG 12-Lead   Medication Changes: No orders of the defined types were placed in this encounter.   Signed, Levi Carnes, MD  10/03/2021 10:53 AM    [facility] [address], [place], [place]  [address] Phone: [phone]; Fax: [phone]

## 2021-10-03 ENCOUNTER — Other Ambulatory Visit: Payer: Self-pay

## 2021-10-03 ENCOUNTER — Encounter: Payer: Self-pay | Admitting: Internal Medicine

## 2021-10-03 ENCOUNTER — Ambulatory Visit (INDEPENDENT_AMBULATORY_CARE_PROVIDER_SITE_OTHER): Payer: Medicare Other | Admitting: Internal Medicine

## 2021-10-03 VITALS — BP 138/68 | HR 71 | Resp 20 | Ht 69.0 in | Wt 289.0 lb

## 2021-10-03 DIAGNOSIS — I10 Essential (primary) hypertension: Secondary | ICD-10-CM | POA: Diagnosis not present

## 2021-10-03 DIAGNOSIS — I442 Atrioventricular block, complete: Secondary | ICD-10-CM | POA: Diagnosis not present

## 2021-10-03 DIAGNOSIS — Z79899 Other long term (current) drug therapy: Secondary | ICD-10-CM

## 2021-10-03 DIAGNOSIS — I5032 Chronic diastolic (congestive) heart failure: Secondary | ICD-10-CM | POA: Diagnosis not present

## 2021-10-03 LAB — CBC
Hematocrit: 40.1 % (ref 37.5–51.0)
Hemoglobin: 13.4 g/dL (ref 13.0–17.7)
MCH: 31 pg (ref 26.6–33.0)
MCHC: 33.4 g/dL (ref 31.5–35.7)
MCV: 93 fL (ref 79–97)
Platelets: 188 10*3/uL (ref 150–450)
RBC: 4.32 x10E6/uL (ref 4.14–5.80)
RDW: 13.5 % (ref 11.6–15.4)
WBC: 6.4 10*3/uL (ref 3.4–10.8)

## 2021-10-03 LAB — BASIC METABOLIC PANEL
BUN/Creatinine Ratio: 16 (ref 10–24)
BUN: 24 mg/dL (ref 10–36)
CO2: 28 mmol/L (ref 20–29)
Calcium: 9.4 mg/dL (ref 8.6–10.2)
Chloride: 97 mmol/L (ref 96–106)
Creatinine, Ser: 1.46 mg/dL — ABNORMAL HIGH (ref 0.76–1.27)
Glucose: 108 mg/dL — ABNORMAL HIGH (ref 70–99)
Potassium: 3.7 mmol/L (ref 3.5–5.2)
Sodium: 138 mmol/L (ref 134–144)
eGFR: 45 mL/min/{1.73_m2} — ABNORMAL LOW (ref >59)

## 2021-10-03 NOTE — Patient Instructions (Addendum)
Medication Instructions:  Your physician recommends that you continue on your current medications as directed. Please refer to the Current Medication list given to you today.  *If you need a refill on your cardiac medications before your next appointment, please call your pharmacy*   Lab Work: CBC AND BMET  If you have labs (blood work) drawn today and your tests are completely normal, you will receive your results only by: [facility] (if you have MyChart) OR A paper copy in the mail If you have any lab test that is abnormal or we need to change your treatment, we will call you to review the results.   Testing/Procedures: none   Follow-Up: At [facility], you and your health needs are our priority.  As part of our continuing mission to provide you with exceptional heart care, we have created designated Provider Care Teams.  These Care Teams include your primary Cardiologist (physician) and Advanced Practice Providers (APPs -  Physician Assistants and Nurse Practitioners) who all work together to provide you with the care you need, when you need it.  We recommend signing up for the patient portal called "MyChart".  Sign up information is provided on this After Visit Summary.  MyChart is used to connect with patients for Virtual Visits (Telemedicine).  Patients are able to view lab/test results, encounter notes, upcoming appointments, etc.  Non-urgent messages can be sent to your provider as well.   To learn more about what you can do with MyChart, go to [email or web address].    Your next appointment:   5 month(s)  The format for your next appointment:   In Person  Provider:   [name], MD     Other Instructions

## 2021-10-05 NOTE — Telephone Encounter (Signed)
**Note De-Identified Judaea [place]on Obfuscation** Letter received Levi [name] fax from [facility] stating that the pt maybe eligible for Jardiance through [place].  He will need to contact [place] at [phone] to determine his eligibility for [place]. If he is denied the pt (or [place] if the pt provides [place] a copy) will need to send [employer] the denial letter before they will consider him for their asst program for Jardiance.  The letter states that they have also sent this letter to the pt.

## 2021-10-10 ENCOUNTER — Ambulatory Visit (INDEPENDENT_AMBULATORY_CARE_PROVIDER_SITE_OTHER): Payer: Medicare Other

## 2021-10-10 DIAGNOSIS — I442 Atrioventricular block, complete: Secondary | ICD-10-CM

## 2021-10-12 LAB — CUP PACEART REMOTE DEVICE CHECK
Battery Remaining Longevity: 37 mo
Battery Remaining Percentage: 32 %
Battery Voltage: 2.95 V
Brady Statistic AP VP Percent: 98 %
Brady Statistic AP VS Percent: 1 %
Brady Statistic AS VP Percent: 2.1 %
Brady Statistic AS VS Percent: 1 %
Brady Statistic RA Percent Paced: 3.4 %
Brady Statistic RV Percent Paced: 99 %
Date Time Interrogation Session: 20230209134155
Implantable Lead Implant Date: 20170202
Implantable Lead Implant Date: 20170202
Implantable Lead Location: 753859
Implantable Lead Location: 753860
Implantable Pulse Generator Implant Date: 20170202
Lead Channel Impedance Value: 400 Ohm
Lead Channel Impedance Value: 400 Ohm
Lead Channel Pacing Threshold Amplitude: 0.75 V
Lead Channel Pacing Threshold Amplitude: 1.25 V
Lead Channel Pacing Threshold Pulse Width: 0.4 ms
Lead Channel Pacing Threshold Pulse Width: 0.4 ms
Lead Channel Sensing Intrinsic Amplitude: 0.7 mV
Lead Channel Sensing Intrinsic Amplitude: 8.5 mV
Lead Channel Setting Pacing Amplitude: 1 V
Lead Channel Setting Pacing Amplitude: 2.5 V
Lead Channel Setting Pacing Pulse Width: 0.4 ms
Lead Channel Setting Sensing Sensitivity: 2 mV
Pulse Gen Model: 2240
Pulse Gen Serial Number: 7864186

## 2021-10-15 NOTE — Progress Notes (Signed)
Remote pacemaker transmission.   

## 2021-10-27 ENCOUNTER — Encounter: Payer: Self-pay | Admitting: Internal Medicine

## 2021-10-29 MED ORDER — PANTOPRAZOLE SODIUM 40 MG PO TBEC: 40.0000 mg | DELAYED_RELEASE_TABLET | Freq: Every day | ORAL | 3 refills | Status: DC

## 2021-11-08 ENCOUNTER — Telehealth: Payer: Self-pay | Admitting: Internal Medicine

## 2021-11-08 NOTE — Telephone Encounter (Signed)
STAT if patient feels like he/she is going to faint  ? ?Are you dizzy now? Not currently with patient ? ?Do you feel faint or have you passed out? No, felt like he would yesterday ? ?Do you have any other symptoms? Not sure ? ?Have you checked your HR and BP (record if available)? Not sure ? ? ? ?Patient's daughter states the patient called her this morning and said yesterday he felt like he was going to faint. She says he also felt dizzy when she spoke with him this morning. She would like the call to go to her, because she may need to call her sister who lives by him. Phone: [phone] ?

## 2021-11-08 NOTE — Telephone Encounter (Signed)
Returned call to pt's daughter Levi [name] (on [place]) who states that her dad called her this AM and wanted her to "call somebody" because he was dizzy yesterday and felt like he was going to pass out. I hung up with daughter and called pt directly for more information. Pt states that yesterday around 12:30pm when he had finished eating "a good lunch", he reports he stood up and walked about 130ft, then felt like he was going to pass out, was dizzy, and states he almost fell but someone was there to help catch him. Pt states that prior to this episode, he felt fine, and post-episode for rest of day he felt weak. Pt denies N/V, CP, headaches, vision changes, or associated SOB worse than normal. Pt does state his L hand hurt yesterday, but unsure what to attribute that to. Pt denies daily weights, condones some swelling in ankles but says not worse than baseline. Pt did not check his BP at all yesterday, so cannot attest to if he was experiencing hypotension. Asked pt to check his BP while on phone, pt provided 147/79, heart rate 70. States he has taken all his usual medications with no recent changes. Pt weighed himself while on phone and reported 273.8lb, his baseline is reported as 281lb. Pt was very winded while on phone after moving around and states that he has been using his bipap much more recently than in the past-uses it frequently during day and every night. No oxygen administered. Pt does confirm he has been having increased belching and acid reflux, but condones using his pantoprazole. Unable to verify if diet could precipitate events. Pt states that today he has not felt dizzy, even after moving around while on phone, just says he is "sorta swimmy headed." Advised pt that he should keep an eye on his BP and call us with any changes at all in his symptoms. Returned call to daughter per request to inform her of the conversation with her dad. She appreciates call, states that this breathing effort and pattern is  usual for him and that he becomes easily anxious if anything, even slightly, occurs, but that she does want to address his concerns. Informed Levi [name] that I will send FYI message to Levi [name], but that it did seem her dad was doing fine and perhaps had an episode of hypotension. Also re-educated that due to his rapid shallow breathing, he may be blowing off excess CO2 which could also cause the same precipitating symptoms. She states that she has also called his PCP, appreciates the call, and will call to check her dad later today as well. Advised that if he is using the bipap more frequently, even in daytime hours, that he may need to have this addressed with PCP to verify that it's his weight and not something else going on with his lungs. Levi [name] states that she is going out of town for 10 days effective 11/11/21, so if we need to contact someone to reach out to other daughter on [place] (Levi [name]) or her brother [place] Levi [name]). ?

## 2021-11-12 NOTE — Telephone Encounter (Signed)
Spoke with the pts daughter, Shauna Hugh, and we made him an appt with Dr. Harrington Challenger 12/20/21 but if anything changes or worsens she will call and we will double book him to come in sooner... or if he feels better she will call to cancel.  ? ?She says he was feling well today... he was doing his ADL's without difficulty..she will check back in with him later today.  ?

## 2021-11-29 ENCOUNTER — Encounter: Payer: Self-pay | Admitting: Internal Medicine

## 2021-11-29 ENCOUNTER — Other Ambulatory Visit: Payer: Self-pay | Admitting: Internal Medicine

## 2021-11-30 NOTE — Telephone Encounter (Signed)
Per Dr Harrington Challenger OK to refill.  TSH to be checked at next office visit.  ?

## 2021-12-19 NOTE — Progress Notes (Signed)
?Cardiology Office Note:   ? ?Date:  12/22/2021  ? ?ID:  Levi [name], DOB [birth year], MRN [id number] ? ?PCP:  Bernerd Limbo, MD  ?Cardiologist:  [name], MD  ?Electrophysiologist:  Cristopher Peru, MD  ? ?Referring MD: Bernerd Limbo, MD  ? ?Chief Complaint:  F/U of CHF   ? ?Patient Profile:   ? ?Levi [name] is a 88 y.o. male with:  ?Coronary artery disease  ?S/p CABG in 2011  ?S/p DES x 2 to LAD in 2013 ?LHC at [facility] in 6/18: patent LIMA-LAD, diff dz elsewhere.  All VGs occluded.  No targets for PCI >> medical Rx recommended   ?Parox AFib ?CHADS2-VASc=5 (age x 2, HTN, CAD, CHF) >> Apixaban 5 mg  ?Amiodarone Rx started in 10/2019 due to increased AF burden ?Symptomatic bradycardia, Mobitz 2 ?S/p Pacemaker Jan 2017 ?Combined systolic and diastolic CHF ?Echocardiogram 12/19: EF 40-45 (previously normal) >> Med Rx ?Admx 10/2018 w/ DHF ?Decomp HF 12/2018 managed as OP with Metolazone ?Admx with Decomp CHF 03/2019 ?Hypertension  ?Hyperlipidemia  ?Chronic kidney disease stage 2  ?OSA on CPAP ?Morbid obesity ? ?Prior CV studies: ?Echo July 2021 ?1. Left ventricular ejection fraction, by estimation, is 45 to 50%. The left ventricle has mildly ?decreased function. The left ventricle demonstrates regional wall motion abnormalities (see ?scoring diagram/findings for description). There is mild concentric left ventricular ?hypertrophy. Left ventricular diastolic parameters are consistent with Grade II diastolic ?dysfunction (pseudonormalization). ?2. Right ventricular systolic function is normal. The right ventricular size is normal. There is ?mildly elevated pulmonary artery systolic pressure. ?3. The mitral valve is normal in structure. Trivial mitral valve regurgitation. No evidence of ?mitral stenosis. ?4. Tricuspid valve regurgitation is mild to moderate. ?5. The aortic valve is tricuspid. Aortic valve regurgitation is not visualized. No aortic stenosis is ?present. ?6. The inferior vena cava is dilated in size  with >50% respiratory variability, suggesting right ?atrial pressure of 8 mmHg. ?  ?Echo 08/28/18 ?Mild conc LVH, EF 40-45, inf/inf-sept/apical HK, Gr 2 DD, MAC, normal RVSF, mod RAE, increased RA pressure, mild TR ?  ?Cardiac catheterization 02/10/2017 ([facility]) ?Coronary angiography reveals 3-vessel CAD.  ?LM is diffusely diseased with 50% distal stenosis.   ?LAD has a 80% mid instent restenosis,  ?LCX has serial 50-60% stenoses in the mid segment, OM2 is a small vessel and has a 70% ostial stenosis.  ?RCA has long diffuse 50-60% proximal and 60% distal stenosis.  ?LIMA to LAD is patent with a kink in the mid segment. Flow is normal.  ?LV-Gram reveals EF 50%. LVEDP is 8 mmHg.  ?SVG grafts are known to be chronically occluded, so not injected  ?  ?Nuclear stress test 01/21/2017 ([facility]) ?1.  Moderate to severe inferoapical inducible ischemia with regadenoson ?2.  Mild to moderate mid inferior and posterior basal defect. Cannot rule out prior infarct. ?3.  Mild to moderate hypokinesis of the inferior wall. Dyskinesia/akinesia of the apex. ?4.  Moderately decreased left ventricular ejection fraction at 43%. ?5.  Please correlate with a cardiac ultrasound ?  ? ?Carotid US 09/14/2015 ?Bilateral ICA 1-39; partial left subclavian steal >> follow-up as needed ?  ?  ?Echo (10/15):   ?Mild LVH, EF 55-60%, no RWMA, mod LAE, mild TR, PASP 42 mmHg ?  ?  ?LHC (12/13):   ?Dist LM 60-70%, ostial LAD 40-50%, D1 occluded, prox LAD 80% then aneurysmal segment then 80%, ostial septal perforator 99%, prox CFX 50%, OM branch serial  60% lesions, prox RCA 30%, PL Br 50%; LIMA-LAD patent, SVG-LAD occluded, SVG-OM occluded, SVG-PDA occluded >> Med Rx >> continued angina >>  ?PCI (2/14):  2.25 x 32 mm Promus DES and 2.5 x 20 mm Promus DES to prox and mid LAD ? ?History of Present Illness:   ? ?Levi [name] is an 88 yo with a hx of CAD (s/p CABG, s/p stent), CHF, morbid obesity, HTN, HL, PAF  and CKD ? ?I saw the pt in Feb 2023   ?Since seen he says he still gets SOB with activity   It is no worse    Denies CP    Notes some LE edema, not as bad as in the past    ? ?Past Medical History:  ?Diagnosis Date  ? Asthma   ? Benign neoplasm of colon   ? CAD (coronary artery disease) Dec 2011  ? s/p CABG per Dr. Roxy Manns; had normal EF; All SVGs occluded per follow up cath with only LIMA to LAD patent; s/p PCI of the proximal and mid LAD February 2014 per Dr. Burt Knack  ? Chronic combined systolic and diastolic CHF (congestive heart failure) ([place])   ? CKD (chronic kidney disease), stage II   ? Emphysema   ? "never treated for it" (10/05/2015)  ? History of hiatal hernia   ? HLD (hyperlipidemia)   ? HTN (hypertension)   ? Malaise and fatigue   ? Obesity   ? Osteopenia   ? PAF (paroxysmal atrial fibrillation) ([place])   ? Pneumonia 1960s X 1; 1990s X 1  ? Presence of permanent cardiac pacemaker   ? Prostate cancer ([place])   ? Skin cancer   ? "face, nose, top of ears, scalp"  ? Symptomatic bradycardia   ? STJ PPM, 10/05/15, Dr. Lovena Le  ? ? ?Current Medications: ?Current Meds  ?Medication Sig  ? amiodarone (PACERONE) 100 MG tablet Take 1 tablet (100 mg total) by mouth daily. (Patient taking differently: Take 100 mg by mouth daily. Taking in the morning)  ? amLODipine (NORVASC) 2.5 MG tablet Take 2.5 mg by mouth daily.  ? apixaban (ELIQUIS) 2.5 MG TABS tablet TAKE 1 TABLET BY MOUTH  TWICE DAILY  ? Artificial Tear Ointment (DRY EYES OP) Place 1 drop into both eyes 3 (three) times daily as needed (for dryness).  ? carvedilol (COREG) 6.25 MG tablet Take 6.125 mg by mouth daily.  ? cetirizine (ZYRTEC) 10 MG tablet Take 10 mg by mouth daily as needed for allergies or rhinitis.   ? empagliflozin (JARDIANCE) 10 MG TABS tablet Take 1 tablet (10 mg total) by mouth daily before breakfast.  ? furosemide (LASIX) 20 MG tablet Take 20 mg by mouth daily.  ? furosemide (LASIX) 40 MG tablet Take by mouth.  ? hydrochlorothiazide (HYDRODIURIL) 25 MG  tablet Take by mouth.  ? isosorbide mononitrate (IMDUR) 60 MG 24 hr tablet Take by mouth.  ? levothyroxine (SYNTHROID) 112 MCG tablet TAKE 1 TABLET BY MOUTH  DAILY BEFORE BREAKFAST  ? methocarbamol (ROBAXIN) 500 MG tablet Take 1 tablet (500 mg total) by mouth 2 (two) times daily.  ? nitroGLYCERIN (NITROSTAT) 0.4 MG SL tablet Place 1 tablet (0.4 mg total) under the tongue every 5 (five) minutes x 3 doses as needed for chest pain.  ? pantoprazole (PROTONIX) 40 MG tablet Take 1 tablet (40 mg total) by mouth at bedtime. (Patient taking differently: Take 40 mg by mouth as needed.)  ? polyethylene glycol (MIRALAX / GLYCOLAX) 17 g packet Take 17 g  by mouth daily.   ? potassium chloride SA (KLOR-CON) 20 MEQ tablet Take 1.5 tablets (30 mEq total) by mouth daily. (Patient taking differently: Take 30 mEq by mouth daily. Taking in the morning)  ? pravastatin (PRAVACHOL) 20 MG tablet TAKE 1 TABLET BY MOUTH  DAILY (Patient taking differently: every evening.)  ? spironolactone (ALDACTONE) 25 MG tablet Take 0.5 tablets (12.5 mg total) by mouth daily.  ? tamsulosin (FLOMAX) 0.4 MG CAPS capsule Take 0.4 mg by mouth at bedtime.   ? torsemide (DEMADEX) 20 MG tablet Take 3 tablets (60 mg total) by mouth 2 (two) times daily.  ?  ? ?Allergies:   Patient has no known allergies.  ? ?Social History  ? ?Tobacco Use  ? Smoking status: Former  ?  Packs/day: 1.00  ?  Years: 30.00  ?  Pack years: 30.00  ?  Types: Cigarettes  ?  Quit date: 09/07/1975  ?  Years since quitting: 46.3  ? Smokeless tobacco: Former  ?  Types: Chew  ?  Quit date: 07/27/2015  ?Vaping Use  ? Vaping Use: Never used  ?Substance Use Topics  ? Alcohol use: No  ?  Alcohol/week: 0.0 standard drinks  ? Drug use: No  ?  ? ?Family Hx: ?The patient's family history includes Heart attack in his father; Heart disease in his brother; Hypertension in his mother; Kidney cancer in his father; Prostate cancer in his brother; Skin cancer in his brother; Stroke in his mother. There is no  history of Allergic rhinitis, Asthma, Eczema, or Urticaria. ? ?ROS  ?See HPI ? ?EKGs/Labs/Other Test Reviewed:   ? ?EKG:  EKG is not ordered  ? ?Recent Labs: ?05/11/2021: NT-Pro BNP 1,997; TSH 3.500 ?07/31/2021:

## 2021-12-20 ENCOUNTER — Encounter: Payer: Self-pay | Admitting: Internal Medicine

## 2021-12-20 ENCOUNTER — Ambulatory Visit (INDEPENDENT_AMBULATORY_CARE_PROVIDER_SITE_OTHER): Payer: Medicare Other | Admitting: Internal Medicine

## 2021-12-20 VITALS — BP 126/66 | HR 69 | Ht 69.0 in | Wt 288.0 lb

## 2021-12-20 DIAGNOSIS — Z79899 Other long term (current) drug therapy: Secondary | ICD-10-CM

## 2021-12-20 DIAGNOSIS — I5032 Chronic diastolic (congestive) heart failure: Secondary | ICD-10-CM

## 2021-12-20 NOTE — Patient Instructions (Signed)
Medication Instructions:  ? ?*If you need a refill on your cardiac medications before your next appointment, please call your pharmacy* ? ?LABWORK:  ?BMET TODAY  ? ?If you have labs (blood work) drawn today and your tests are completely normal, you will receive your results only by: ?MyChart Message (if you have MyChart) OR ?A paper copy in the mail ?If you have any lab test that is abnormal or we need to change your treatment, we will call you to review the results. ? ? ?Testing/Procedures: ? ? ?Follow-Up: ?At [facility], you and your health needs are our priority.  As part of our continuing mission to provide you with exceptional heart care, we have created designated Provider Care Teams.  These Care Teams include your primary Cardiologist (physician) and Advanced Practice Providers (APPs -  Physician Assistants and Nurse Practitioners) who all work together to provide you with the care you need, when you need it. ? ?We recommend signing up for the patient portal called "MyChart".  Sign up information is provided on this After Visit Summary.  MyChart is used to connect with patients for Virtual Visits (Telemedicine).  Patients are able to view lab/test results, encounter notes, upcoming appointments, etc.  Non-urgent messages can be sent to your provider as well.   ?To learn more about what you can do with MyChart, go to [email or web address].   ? ?Your next appointment:   ?6 month(s) ? ?The format for your next appointment:   ?In Person ? ?Provider:   ?[name], MD   ? ? ?Other Instructions ? ? ?Important Information About Sugar ? ? ? ? ?  ?

## 2021-12-21 LAB — BASIC METABOLIC PANEL
BUN/Creatinine Ratio: 14 (ref 10–24)
BUN: 27 mg/dL (ref 10–36)
CO2: 21 mmol/L (ref 20–29)
Calcium: 8.7 mg/dL (ref 8.6–10.2)
Chloride: 99 mmol/L (ref 96–106)
Creatinine, Ser: 1.91 mg/dL — ABNORMAL HIGH (ref 0.76–1.27)
Glucose: 100 mg/dL — ABNORMAL HIGH (ref 70–99)
Potassium: 4.1 mmol/L (ref 3.5–5.2)
Sodium: 140 mmol/L (ref 134–144)
eGFR: 33 mL/min/{1.73_m2} — ABNORMAL LOW (ref >59)

## 2021-12-24 ENCOUNTER — Telehealth: Payer: Self-pay | Admitting: Internal Medicine

## 2021-12-24 NOTE — Telephone Encounter (Signed)
Pt's daughter returning nurses call regarding pt's mediation list. Please advise ?

## 2021-12-24 NOTE — Telephone Encounter (Signed)
Spoke with the pts daughter re: his recent labwork and confirmed his diuretics:  ? ?Torsemide 60 mg in the morning and 40 in the evening ?Spironolactone 12.5 mg daily  ?KCL 30 meq daily  ?NO LASIX  ? ?Will alter the med list after talking with Dr Harrington Challenger to confirm these doses do not need to change.  ? ? ? ?

## 2021-12-24 NOTE — Telephone Encounter (Signed)
Keep on same meds  ?

## 2021-12-24 NOTE — Telephone Encounter (Signed)
-----   Message from [name], MD sent at 12/21/2021  4:45 PM EDT ----- ?Cr is a little higher than it has been ?Overall his fluid status did not look too bad yesterday   Up some but better than it had been. ?Please clarify what he is taking    I would not change   I hope he is not taking lasix and torsemide at same time  ?

## 2022-01-09 ENCOUNTER — Ambulatory Visit (INDEPENDENT_AMBULATORY_CARE_PROVIDER_SITE_OTHER): Payer: Medicare Other

## 2022-01-09 DIAGNOSIS — I442 Atrioventricular block, complete: Secondary | ICD-10-CM | POA: Diagnosis not present

## 2022-01-10 LAB — CUP PACEART REMOTE DEVICE CHECK
Battery Remaining Longevity: 34 mo
Battery Remaining Percentage: 30 %
Battery Voltage: 2.95 V
Brady Statistic AP VP Percent: 98 %
Brady Statistic AP VS Percent: 1 %
Brady Statistic AS VP Percent: 1.9 %
Brady Statistic AS VS Percent: 1 %
Brady Statistic RA Percent Paced: 11 %
Brady Statistic RV Percent Paced: 99 %
Date Time Interrogation Session: 20230510081307
Implantable Lead Implant Date: 20170202
Implantable Lead Implant Date: 20170202
Implantable Lead Location: 753859
Implantable Lead Location: 753860
Implantable Pulse Generator Implant Date: 20170202
Lead Channel Impedance Value: 380 Ohm
Lead Channel Impedance Value: 410 Ohm
Lead Channel Pacing Threshold Amplitude: 0.875 V
Lead Channel Pacing Threshold Amplitude: 1.25 V
Lead Channel Pacing Threshold Pulse Width: 0.4 ms
Lead Channel Pacing Threshold Pulse Width: 0.4 ms
Lead Channel Sensing Intrinsic Amplitude: 0.8 mV
Lead Channel Sensing Intrinsic Amplitude: 8.5 mV
Lead Channel Setting Pacing Amplitude: 1.125
Lead Channel Setting Pacing Amplitude: 2.5 V
Lead Channel Setting Pacing Pulse Width: 0.4 ms
Lead Channel Setting Sensing Sensitivity: 2 mV
Pulse Gen Model: 2240
Pulse Gen Serial Number: 7864186

## 2022-01-17 NOTE — Progress Notes (Signed)
Remote pacemaker transmission.   

## 2022-01-31 ENCOUNTER — Encounter: Payer: Self-pay | Admitting: Internal Medicine

## 2022-01-31 MED ORDER — APIXABAN 2.5 MG PO TABS: 2.5000 mg | ORAL_TABLET | Freq: Two times a day (BID) | ORAL | 2 refills | Status: DC

## 2022-01-31 NOTE — Telephone Encounter (Signed)
Pt last saw Dr Harrington Challenger 12/20/21, last labs 12/20/21 Creat 1.91, age 88, weight 130.6kg, based on specified criteria pt is on appropriate dosage of Eliquis 2.5mg  BID for afib.  Will refill rx.

## 2022-02-01 ENCOUNTER — Other Ambulatory Visit: Payer: Self-pay | Admitting: Internal Medicine

## 2022-02-21 ENCOUNTER — Encounter: Payer: Self-pay | Admitting: Internal Medicine

## 2022-02-22 MED ORDER — EMPAGLIFLOZIN 10 MG PO TABS: 10.0000 mg | ORAL_TABLET | Freq: Every day | ORAL | 0 refills | Status: DC

## 2022-03-01 ENCOUNTER — Telehealth: Payer: Self-pay

## 2022-03-01 NOTE — Telephone Encounter (Addendum)
**Note De-Identified Levi [name] Obfuscation** The pts completed BMSPAF application for Eliquis asst and a [employer] application for Jardiance asst was left at the office with documents.  I completed the providers page or both applications with our DOD, Dr Tanna Furry information in Dr Alan Ripper absence.  I have emailed all of both applications to [place], [employer], retail so she can obtain Dr Tanna Furry signature/date on each application and to fax to the appropriate foundation at the fax numbers written on the cover letters included.  I have advised the pts daughter and DPR, [name] Madylyn [place] [facility] message that we have taken care of both applications and plan to fax to each foundations today.

## 2022-03-01 NOTE — Telephone Encounter (Signed)
Applications received and signed by Dr. Lovena Le.   Faxed BMSPAF application for Eliquis to [phone]. Confirmation received that fax was successful.  Faxed [employer] Cares Application for Jardiance to [phone]. Confirmation received that fax was successful.

## 2022-03-04 ENCOUNTER — Encounter (HOSPITAL_COMMUNITY): Payer: Self-pay

## 2022-03-04 ENCOUNTER — Other Ambulatory Visit: Payer: Self-pay

## 2022-03-04 ENCOUNTER — Inpatient Hospital Stay (HOSPITAL_COMMUNITY)
Admission: EM | Admit: 2022-03-04 | Discharge: 2022-03-09 | DRG: 291 | Disposition: A | Discharge status: Home Health | Payer: Medicare Other | Attending: Internal Medicine | Admitting: Internal Medicine

## 2022-03-04 ENCOUNTER — Emergency Department (HOSPITAL_COMMUNITY): Payer: Medicare Other

## 2022-03-04 DIAGNOSIS — Z87891 Personal history of nicotine dependence: Secondary | ICD-10-CM | POA: Diagnosis not present

## 2022-03-04 DIAGNOSIS — R0603 Acute respiratory distress: Secondary | ICD-10-CM | POA: Diagnosis present

## 2022-03-04 DIAGNOSIS — I480 Paroxysmal atrial fibrillation: Secondary | ICD-10-CM | POA: Diagnosis present

## 2022-03-04 DIAGNOSIS — I10 Essential (primary) hypertension: Secondary | ICD-10-CM | POA: Diagnosis not present

## 2022-03-04 DIAGNOSIS — Z8546 Personal history of malignant neoplasm of prostate: Secondary | ICD-10-CM

## 2022-03-04 DIAGNOSIS — Z951 Presence of aortocoronary bypass graft: Secondary | ICD-10-CM

## 2022-03-04 DIAGNOSIS — Z955 Presence of coronary angioplasty implant and graft: Secondary | ICD-10-CM | POA: Diagnosis not present

## 2022-03-04 DIAGNOSIS — Z981 Arthrodesis status: Secondary | ICD-10-CM

## 2022-03-04 DIAGNOSIS — G4733 Obstructive sleep apnea (adult) (pediatric): Secondary | ICD-10-CM | POA: Diagnosis present

## 2022-03-04 DIAGNOSIS — J9601 Acute respiratory failure with hypoxia: Secondary | ICD-10-CM | POA: Diagnosis present

## 2022-03-04 DIAGNOSIS — E662 Morbid (severe) obesity with alveolar hypoventilation: Secondary | ICD-10-CM | POA: Diagnosis present

## 2022-03-04 DIAGNOSIS — Z6841 Body Mass Index (BMI) 40.0 and over, adult: Secondary | ICD-10-CM | POA: Diagnosis not present

## 2022-03-04 DIAGNOSIS — J449 Chronic obstructive pulmonary disease, unspecified: Secondary | ICD-10-CM | POA: Diagnosis present

## 2022-03-04 DIAGNOSIS — I071 Rheumatic tricuspid insufficiency: Secondary | ICD-10-CM | POA: Diagnosis present

## 2022-03-04 DIAGNOSIS — Z823 Family history of stroke: Secondary | ICD-10-CM | POA: Diagnosis not present

## 2022-03-04 DIAGNOSIS — N1832 Chronic kidney disease, stage 3b: Secondary | ICD-10-CM | POA: Diagnosis present

## 2022-03-04 DIAGNOSIS — I130 Hypertensive heart and chronic kidney disease with heart failure and stage 1 through stage 4 chronic kidney disease, or unspecified chronic kidney disease: Principal | ICD-10-CM | POA: Diagnosis present

## 2022-03-04 DIAGNOSIS — Z8051 Family history of malignant neoplasm of kidney: Secondary | ICD-10-CM | POA: Diagnosis not present

## 2022-03-04 DIAGNOSIS — Z8042 Family history of malignant neoplasm of prostate: Secondary | ICD-10-CM

## 2022-03-04 DIAGNOSIS — E039 Hypothyroidism, unspecified: Secondary | ICD-10-CM | POA: Diagnosis present

## 2022-03-04 DIAGNOSIS — Z7901 Long term (current) use of anticoagulants: Secondary | ICD-10-CM | POA: Diagnosis not present

## 2022-03-04 DIAGNOSIS — E785 Hyperlipidemia, unspecified: Secondary | ICD-10-CM | POA: Diagnosis present

## 2022-03-04 DIAGNOSIS — Z8249 Family history of ischemic heart disease and other diseases of the circulatory system: Secondary | ICD-10-CM

## 2022-03-04 DIAGNOSIS — N179 Acute kidney failure, unspecified: Secondary | ICD-10-CM | POA: Diagnosis present

## 2022-03-04 DIAGNOSIS — I5043 Acute on chronic combined systolic (congestive) and diastolic (congestive) heart failure: Secondary | ICD-10-CM | POA: Diagnosis present

## 2022-03-04 DIAGNOSIS — Z950 Presence of cardiac pacemaker: Secondary | ICD-10-CM | POA: Diagnosis not present

## 2022-03-04 DIAGNOSIS — Z20822 Contact with and (suspected) exposure to covid-19: Secondary | ICD-10-CM | POA: Diagnosis present

## 2022-03-04 DIAGNOSIS — I2510 Atherosclerotic heart disease of native coronary artery without angina pectoris: Secondary | ICD-10-CM | POA: Diagnosis present

## 2022-03-04 DIAGNOSIS — Z808 Family history of malignant neoplasm of other organs or systems: Secondary | ICD-10-CM

## 2022-03-04 LAB — CBC WITH DIFFERENTIAL/PLATELET
Abs Immature Granulocytes: 0.04 10*3/uL (ref 0.00–0.07)
Basophils Absolute: 0.1 10*3/uL (ref 0.0–0.1)
Basophils Relative: 1 %
Eosinophils Absolute: 0.3 10*3/uL (ref 0.0–0.5)
Eosinophils Relative: 3 %
HCT: 43.5 % (ref 39.0–52.0)
Hemoglobin: 14.2 g/dL (ref 13.0–17.0)
Immature Granulocytes: 0 %
Lymphocytes Relative: 11 %
Lymphs Abs: 1 10*3/uL (ref 0.7–4.0)
MCH: 32.1 pg (ref 26.0–34.0)
MCHC: 32.6 g/dL (ref 30.0–36.0)
MCV: 98.4 fL (ref 80.0–100.0)
Monocytes Absolute: 0.9 10*3/uL (ref 0.1–1.0)
Monocytes Relative: 10 %
Neutro Abs: 7.2 10*3/uL (ref 1.7–7.7)
Neutrophils Relative %: 75 %
Platelets: 228 10*3/uL (ref 150–400)
RBC: 4.42 MIL/uL (ref 4.22–5.81)
RDW: 14.7 % (ref 11.5–15.5)
WBC: 9.5 10*3/uL (ref 4.0–10.5)
nRBC: 0 % (ref 0.0–0.2)

## 2022-03-04 LAB — BRAIN NATRIURETIC PEPTIDE: B Natriuretic Peptide: 56.3 pg/mL (ref 0.0–100.0)

## 2022-03-04 LAB — I-STAT VENOUS BLOOD GAS, ED
Acid-Base Excess: 10 mmol/L — ABNORMAL HIGH (ref 0.0–2.0)
Bicarbonate: 33.2 mmol/L — ABNORMAL HIGH (ref 20.0–28.0)
Calcium, Ion: 0.98 mmol/L — ABNORMAL LOW (ref 1.15–1.40)
HCT: 43 % (ref 39.0–52.0)
Hemoglobin: 14.6 g/dL (ref 13.0–17.0)
O2 Saturation: 100 %
Potassium: 4 mmol/L (ref 3.5–5.1)
Sodium: 137 mmol/L (ref 135–145)
TCO2: 34 mmol/L — ABNORMAL HIGH (ref 22–32)
pCO2, Ven: 40 mmHg — ABNORMAL LOW (ref 44–60)
pH, Ven: 7.528 — ABNORMAL HIGH (ref 7.25–7.43)
pO2, Ven: 197 mmHg — ABNORMAL HIGH (ref 32–45)

## 2022-03-04 LAB — BASIC METABOLIC PANEL
Anion gap: 10 (ref 5–15)
BUN: 26 mg/dL — ABNORMAL HIGH (ref 8–23)
CO2: 28 mmol/L (ref 22–32)
Calcium: 8.9 mg/dL (ref 8.9–10.3)
Chloride: 100 mmol/L (ref 98–111)
Creatinine, Ser: 1.71 mg/dL — ABNORMAL HIGH (ref 0.61–1.24)
GFR, Estimated: 37 mL/min — ABNORMAL LOW (ref >60)
Glucose, Bld: 120 mg/dL — ABNORMAL HIGH (ref 70–99)
Potassium: 4 mmol/L (ref 3.5–5.1)
Sodium: 138 mmol/L (ref 135–145)

## 2022-03-04 LAB — TROPONIN I (HIGH SENSITIVITY)
Troponin I (High Sensitivity): 25 ng/L — ABNORMAL HIGH (ref <18)
Troponin I (High Sensitivity): 26 ng/L — ABNORMAL HIGH (ref <18)

## 2022-03-04 LAB — I-STAT CHEM 8, ED
BUN: 28 mg/dL — ABNORMAL HIGH (ref 8–23)
Calcium, Ion: 0.98 mmol/L — ABNORMAL LOW (ref 1.15–1.40)
Chloride: 100 mmol/L (ref 98–111)
Creatinine, Ser: 1.8 mg/dL — ABNORMAL HIGH (ref 0.61–1.24)
Glucose, Bld: 121 mg/dL — ABNORMAL HIGH (ref 70–99)
HCT: 44 % (ref 39.0–52.0)
Hemoglobin: 15 g/dL (ref 13.0–17.0)
Potassium: 3.9 mmol/L (ref 3.5–5.1)
Sodium: 138 mmol/L (ref 135–145)
TCO2: 26 mmol/L (ref 22–32)

## 2022-03-04 LAB — RESP PANEL BY RT-PCR (FLU A&B, COVID) ARPGX2
Influenza A by PCR: NEGATIVE
Influenza B by PCR: NEGATIVE
SARS Coronavirus 2 by RT PCR: NEGATIVE

## 2022-03-04 MED ORDER — ACETAMINOPHEN 650 MG RE SUPP: 650.0000 mg | Freq: Four times a day (QID) | RECTAL | Status: DC | PRN

## 2022-03-04 MED ORDER — LEVOTHYROXINE SODIUM 112 MCG PO TABS
112.0000 ug | ORAL_TABLET | Freq: Every day | ORAL | Status: DC
  Administered 2022-03-05 – 2022-03-09 (×5): 112 ug via ORAL
  Filled 2022-03-04 (×5): qty 1

## 2022-03-04 MED ORDER — BISACODYL 5 MG PO TBEC: 5.0000 mg | DELAYED_RELEASE_TABLET | Freq: Every day | ORAL | Status: DC | PRN

## 2022-03-04 MED ORDER — TAMSULOSIN HCL 0.4 MG PO CAPS
0.4000 mg | ORAL_CAPSULE | Freq: Every day | ORAL | Status: DC
  Administered 2022-03-04 – 2022-03-08 (×5): 0.4 mg via ORAL
  Filled 2022-03-04 (×5): qty 1

## 2022-03-04 MED ORDER — SODIUM CHLORIDE 0.9% FLUSH
3.0000 mL | Freq: Two times a day (BID) | INTRAVENOUS | Status: DC
  Administered 2022-03-04 – 2022-03-08 (×9): 3 mL via INTRAVENOUS

## 2022-03-04 MED ORDER — TRAZODONE HCL 50 MG PO TABS
25.0000 mg | ORAL_TABLET | Freq: Every evening | ORAL | Status: DC | PRN
  Administered 2022-03-05 – 2022-03-07 (×3): 25 mg via ORAL
  Filled 2022-03-04 (×3): qty 1

## 2022-03-04 MED ORDER — SPIRONOLACTONE 12.5 MG HALF TABLET
12.5000 mg | ORAL_TABLET | Freq: Every day | ORAL | Status: DC
  Administered 2022-03-05 – 2022-03-09 (×5): 12.5 mg via ORAL
  Filled 2022-03-04 (×5): qty 1

## 2022-03-04 MED ORDER — DOCUSATE SODIUM 100 MG PO CAPS
100.0000 mg | ORAL_CAPSULE | Freq: Two times a day (BID) | ORAL | Status: DC
  Administered 2022-03-04 – 2022-03-09 (×8): 100 mg via ORAL
  Filled 2022-03-04 (×10): qty 1

## 2022-03-04 MED ORDER — ONDANSETRON HCL 4 MG PO TABS: 4.0000 mg | ORAL_TABLET | Freq: Four times a day (QID) | ORAL | Status: DC | PRN

## 2022-03-04 MED ORDER — APIXABAN 2.5 MG PO TABS
2.5000 mg | ORAL_TABLET | Freq: Two times a day (BID) | ORAL | Status: DC
  Administered 2022-03-04 – 2022-03-09 (×10): 2.5 mg via ORAL
  Filled 2022-03-04 (×10): qty 1

## 2022-03-04 MED ORDER — HYDRALAZINE HCL 20 MG/ML IJ SOLN: 5.0000 mg | INTRAMUSCULAR | Status: DC | PRN

## 2022-03-04 MED ORDER — ONDANSETRON HCL 4 MG/2ML IJ SOLN: 4.0000 mg | Freq: Four times a day (QID) | INTRAMUSCULAR | Status: DC | PRN

## 2022-03-04 MED ORDER — AMIODARONE HCL 100 MG PO TABS
100.0000 mg | ORAL_TABLET | Freq: Every day | ORAL | Status: DC
  Administered 2022-03-05 – 2022-03-09 (×5): 100 mg via ORAL
  Filled 2022-03-04 (×5): qty 1

## 2022-03-04 MED ORDER — POLYETHYLENE GLYCOL 3350 17 G PO PACK: 17.0000 g | PACK | Freq: Every day | ORAL | Status: DC | PRN

## 2022-03-04 MED ORDER — PRAVASTATIN SODIUM 10 MG PO TABS
20.0000 mg | ORAL_TABLET | Freq: Every day | ORAL | Status: DC
  Administered 2022-03-05 – 2022-03-09 (×5): 20 mg via ORAL
  Filled 2022-03-04 (×5): qty 2

## 2022-03-04 MED ORDER — FUROSEMIDE 10 MG/ML IJ SOLN
40.0000 mg | Freq: Once | INTRAMUSCULAR | Status: AC
  Administered 2022-03-04: 40 mg via INTRAVENOUS
  Filled 2022-03-04: qty 4

## 2022-03-04 MED ORDER — FUROSEMIDE 10 MG/ML IJ SOLN
40.0000 mg | Freq: Two times a day (BID) | INTRAMUSCULAR | Status: DC
  Administered 2022-03-04 – 2022-03-06 (×5): 40 mg via INTRAVENOUS
  Filled 2022-03-04 (×5): qty 4

## 2022-03-04 MED ORDER — ACETAMINOPHEN 325 MG PO TABS
650.0000 mg | ORAL_TABLET | Freq: Four times a day (QID) | ORAL | Status: DC | PRN
  Administered 2022-03-07 – 2022-03-08 (×2): 650 mg via ORAL
  Filled 2022-03-04 (×2): qty 2

## 2022-03-04 NOTE — ED Notes (Signed)
Pt tolerating being off of bipap at this time. Pt is on 4L via [place]

## 2022-03-04 NOTE — H&P (Signed)
History and Physical    Patient: Levi [name] GYI:[id number] DOB: [birth year] DOA: 03/04/2022 DOS: the patient was seen and examined on 03/04/2022 PCP: Bernerd Limbo, MD  Patient coming from: Home - lives alone, daughter lives next door; NOK: Daughter, Shauna Hugh, [phone]   Chief Complaint: Respiratory distress  HPI: Levi [name] is a 88 y.o. male with medical history significant of CAD s/p CABG; chronic combined CHF; COPD; HTN; HLD; morbid obesity; afib; pacemaker placement; and prostate CA presenting with respiratory distress.  His daughter reports chronic breathing issues.  It got worse Thursday and it has been getting gradually worse all weekend.  Today, he couldn't breathe at all and he couldn't speak in full sentences.  He has had "fluid build up" before.  He has been wheezing lately.  +cough, non-productive.  He is not on nebs/MDI.  +orthopnea, chronically sleeps in recliner.  +abdominal edema, LE edema.  No recent med or diet changes.  His primary concern right now is wanted a drink - despite BIPAP.  He is full code.    ER Course:   Brought in by EMS on CPAP. Felt better on BIPAP in ER.  Appears to have fluid overload despite taking Lasix.  CXR with ?infiltrate, low suspicion for infection.     Review of Systems: unable to review all systems due to the inability of the patient to answer questions.  Limited by BIPAP.   Past Medical History:  Diagnosis Date   Asthma    Benign neoplasm of colon    CAD (coronary artery disease) Dec 2011   s/p CABG per Dr. Roxy Manns; had normal EF; All SVGs occluded per follow up cath with only LIMA to LAD patent; s/p PCI of the proximal and mid LAD February 2014 per Dr. Burt Knack   Chronic combined systolic and diastolic CHF (congestive heart failure) ([place])    CKD (chronic kidney disease), stage II    Emphysema    "never treated for it" (10/05/2015)   History of hiatal hernia    HLD (hyperlipidemia)    HTN (hypertension)    Malaise and  fatigue    Obesity    Osteopenia    PAF (paroxysmal atrial fibrillation) (HCC)    Pneumonia 1960s X 1; 1990s X 1   Presence of permanent cardiac pacemaker    Prostate cancer ([place])    Skin cancer    "face, nose, top of ears, scalp"   Symptomatic bradycardia    STJ PPM, 10/05/15, Dr. Lovena Le   Past Surgical History:  Procedure Laterality Date   ANTERIOR CERVICAL DECOMP/DISCECTOMY FUSION     BACK SURGERY     [place] EXTRACTION Right    CORONARY ANGIOPLASTY WITH STENT PLACEMENT  10/14/12   with stent to proximal and mid LAD   CORONARY ARTERY BYPASS GRAFT  08/23/10   "CABG X4"   EP IMPLANTABLE DEVICE N/A 10/05/2015   Procedure: Pacemaker Implant;  Surgeon: [name], MD;  Location: [place] [facility];  Service: Cardiovascular;  Laterality: N/A;   EP IMPLANTABLE DEVICE N/A 10/06/2015   Procedure: Lead Revision/Repair;  Surgeon: [name], MD;  Location: [place] [facility];  Service: Cardiovascular;  Laterality: N/A;   EYE SURGERY     INSERT / REPLACE / REMOVE PACEMAKER  10/05/2015   LEFT HEART CATHETERIZATION WITH CORONARY/GRAFT ANGIOGRAM N/A 09/01/2012   Procedure: LEFT HEART CATHETERIZATION WITH [name];  Surgeon: [name], MD;  Location: [facility] CATH LAB;  Service:  Cardiovascular;  Laterality: N/A;   MEDIAN STERNOTOMY  08/23/10   MOHS SURGERY  "@ least twice"   PACEMAKER INSERTION     STJ 10/05/15   PERCUTANEOUS CORONARY STENT INTERVENTION (PCI-S) N/A 10/14/2012   Procedure: PERCUTANEOUS CORONARY STENT INTERVENTION (PCI-S);  Surgeon: [name], MD;  Location[facility] CATH LAB;  Service: Cardiovascular;  Laterality: N/A;   PROSTATE BIOPSY     TRANSURETHRAL RESECTION OF PROSTATE     Social History:  reports that he quit smoking about 46 years ago. His smoking use included cigarettes. He has a 30.00 pack-year smoking history. He quit smokeless tobacco use about 6 years ago.  His smokeless tobacco use included chew. He reports that he  does not drink alcohol and does not use drugs.  No Known Allergies  Family History  Problem Relation Age of Onset   Kidney cancer Father    Heart attack Father    Hypertension Mother    Stroke Mother    Heart disease Brother    Skin cancer Brother    Prostate cancer Brother    Allergic rhinitis Neg Hx    Asthma Neg Hx    Eczema Neg Hx    Urticaria Neg Hx     Prior to Admission medications   Medication Sig Start Date End Date Taking? Authorizing Provider  amiodarone (PACERONE) 100 MG tablet Take 1 tablet (100 mg total) by mouth daily. Patient taking differently: Take 100 mg by mouth daily. Taking in the morning 09/27/21  Yes [name], MD  apixaban (ELIQUIS) 2.5 MG TABS tablet Take 1 tablet (2.5 mg total) by mouth 2 (two) times daily. 01/31/22  Yes [name], MD  cetirizine (ZYRTEC) 10 MG tablet Take 10 mg by mouth daily as needed for allergies or rhinitis.    Yes [provider]  empagliflozin (JARDIANCE) 10 MG TABS tablet Take 1 tablet (10 mg total) by mouth daily before breakfast. 02/22/22  Yes [name], MD  levothyroxine (SYNTHROID) 112 MCG tablet TAKE 1 TABLET BY MOUTH  DAILY BEFORE BREAKFAST 11/30/21  Yes [name], MD  nitroGLYCERIN (NITROSTAT) 0.4 MG SL tablet Place 1 tablet (0.4 mg total) under the tongue every 5 (five) minutes x 3 doses as needed for chest pain. 03/22/20  Yes Weaver, Scott T, PA-C  polyethylene glycol (MIRALAX / GLYCOLAX) 17 g packet Take 17 g by mouth daily.    Yes [provider]  potassium chloride SA (KLOR-CON) 20 MEQ tablet Take 1.5 tablets (30 mEq total) by mouth daily. Patient taking differently: Take 30 mEq by mouth daily. Taking in the morning 06/15/21  Yes Weaver, Scott T, PA-C  pravastatin (PRAVACHOL) 20 MG tablet TAKE 1 TABLET BY MOUTH  DAILY Patient taking differently: Take 20 mg by mouth daily. 08/31/21  Yes [name], MD  spironolactone (ALDACTONE) 25 MG tablet Take 0.5 tablets (12.5 mg total) by mouth daily.  06/14/21  Yes Weaver, Scott T, PA-C  tamsulosin (FLOMAX) 0.4 MG CAPS capsule Take 0.4 mg by mouth at bedtime.    Yes [provider]  torsemide (DEMADEX) 20 MG tablet Take 3 tablets (60 mg total) by mouth 2 (two) times daily. 05/15/21  Yes [name], MD  methocarbamol (ROBAXIN) 500 MG tablet Take 1 tablet (500 mg total) by mouth 2 (two) times daily. Patient not taking: Reported on 03/04/2022 09/05/21   Truddie Hidden, MD  pantoprazole (PROTONIX) 40 MG tablet Take 1 tablet (40 mg total) by mouth at bedtime. Patient  not taking: Reported on 03/04/2022 10/29/21   [name], MD    Physical Exam: Vitals:   03/04/22 1215 03/04/22 1230 03/04/22 1245 03/04/22 1300  BP: (!) 131/40 (!) 152/76 (!) 158/81 (!) 152/84  Pulse: 70 72 70 70  Resp: (!) 30 (!) 24 (!) 25 (!) 24  Temp:      TempSrc:      SpO2: 97% 97% 100% 98%  Weight:      Height:       General:  Appears calm and comfortable and is in NAD, on BIPAP Eyes:  EOMI, normal lids, iris ENT:  hard of hearing, grossly normal lips & tongue, mmm; BIPAP in place Neck:  no LAD, masses or thyromegaly Cardiovascular:  RRR, no m/r/g. 1+ LE edema.  Respiratory:   CTA bilaterally with no wheezes/rales/rhonchi but with diminished breath sounds diffusely despite BIPAP.  Mildly increased respiratory effort on BIPAP Abdomen:  soft, NT, ND Skin:  no rash or induration seen on limited exam Musculoskeletal:  grossly normal tone BUE/BLE, good ROM, no bony abnormality Lower extremity:  No LE edema.  Limited foot exam with no ulcerations.  2+ distal pulses. Psychiatric:  blunted mood and affect, speech fluent and appropriate but limited by BIPAP Neurologic:  CN 2-12 grossly intact, moves all extremities in coordinated fashion   Radiological Exams on Admission: Independently reviewed - see discussion in A/P where applicable  DG Chest Port 1 View  Result Date: 03/04/2022 CLINICAL DATA:  Respiratory distress. EXAM: PORTABLE CHEST 1 VIEW COMPARISON:   Chest radiograph 03/14/2020 and earlier FINDINGS: Left chest wall pacemaker with leads overlying the right atrium and right ventricle. Postoperative changes of median sternotomy and coronary artery bypass graft. Coronary artery stent. Left atrial appendage clip. Stable enlarged cardiac silhouette. Aortic calcifications. New nodular peripheral airspace opacity in the right mid lung. Unchanged bibasilar atelectasis. No pleural effusion or pneumothorax. Anterior cervical spinal fusion. IMPRESSION: New nodular peripheral airspace opacity in the right mid lung may represent infection in the appropriate clinical context versus new pulmonary nodule. Consider CT chest with intravenous contrast for further evaluation. Stable cardiomegaly.  Aortic Atherosclerosis (ICD10-I70.0). Electronically Signed   By: Ileana Roup M.D.   On: 03/04/2022 12:20    EKG: Independently reviewed.  NSR with rate 70; prolonged QTc 528; LVH, IVCD   Labs on Admission: I have personally reviewed the available labs and imaging studies at the time of the admission.  Pertinent labs:    VBG: 7.528/40/33.2 Glucose 120 BUN 26/Creatinine 1.71/GFR 37; stable from 07/2021 HS troponin 25 BNP 56.3 Normal CBC   Assessment and Plan: Principal Problem:   Acute on chronic combined systolic and diastolic CHF (congestive heart failure) (HCC) Active Problems:   HLD (hyperlipidemia)   Morbid obesity (HCC)   Essential hypertension   PAF (paroxysmal atrial fibrillation) (HCC)   H/O coronary artery bypass surgery   OSA (obstructive sleep apnea)   Presence of permanent cardiac pacemaker   Chronic kidney disease, stage 3b (HCC)    CHF exacerbation -Patient with known h/o chronic combined CHF (echo in 03/2020 with EF 45-50% and grade 2 diastolic dysfunction) presenting with worsening SOB  -CXR without apparent pulmonary edema; there is a likely nodule (no infectious s/sx) that would be better visualized with CT -Normal BNP -Despite normal  BNP and CXR, acute decompensated CHF seems probable as diagnosis -He is on BIPAP with improved respiratory effort; will attempt to transition to [place] O2 when able -Will admit, as per the Emergency HF Mortality  Risk Grade.  The patient has: respiratory distress requiring BIPAP -Will request echocardiogram -CHF order set utilized; may need CHF team consult but will hold until Echo results are available -Was given Lasix 40 mg x 1 in ER and will repeat with 40 mg IV BID -Continue BIPAP/[place] O2 prn for now -Stable kidney function at this time, will follow -Mildly elevated HS troponin is likely related to demand ischemia; doubt ACS based on symptoms but will repeat -Hold Jardiance for now  HTN -Continue home spironolactone -Will also add prn hydralazine  HLD -Continue Pravachol -Check lipids  CAD  -s/p CABG -Has pacer  Afib -Continue Amiodarone for rate control, has pacemaker -Continue Eliquis  Hypothyroidism -Continue Synthroid  Stage 3b CKD -Appears to be stable at this time -Will recheck BMP tomorrow to ensure stability with diuresis  OSA -Continue qhs CPAP when not on BIPAP  Obesity -Body mass index is 42.52 kg/m..  -Weight loss should be encouraged -Outpatient PCP/bariatric medicine f/u encouraged    Advance Care Planning:   Code Status: Full Code   Consults: CHF navigator; TOC team; nutrition; PT/OT  DVT Prophylaxis: Eliquis  Family Communication: Daughter was present throughout evaluation and provided most of the history  Severity of Illness: The appropriate patient status for this patient is INPATIENT. Inpatient status is judged to be reasonable and necessary in order to provide the required intensity of service to ensure the patient's safety. The patient's presenting symptoms, physical exam findings, and initial radiographic and laboratory data in the context of their chronic comorbidities is felt to place them at high risk for further clinical deterioration.  Furthermore, it is not anticipated that the patient will be medically stable for discharge from the hospital within 2 midnights of admission.   * I certify that at the point of admission it is my clinical judgment that the patient will require inpatient hospital care spanning beyond 2 midnights from the point of admission due to high intensity of service, high risk for further deterioration and high frequency of surveillance required.*  Author: Karmen Bongo, MD 03/04/2022 2:41 PM  For on call review [email or web address].

## 2022-03-04 NOTE — ED Triage Notes (Signed)
Pt arrived to ED via EMS from home w/ c/o respiratory distress. Pt arrived on cpap. EMS reports Fire told them pt was 92% on RA. EMS reports pt was on [place] when they arrived and was labored and speaking in broken sentences. EMS placed pt on cpap and pt O2 sats were 98%. VSS w/ EMS. Pt 74 in paced rhythm. 20g L AC. 1 SL nitro given.

## 2022-03-04 NOTE — ED Provider Notes (Signed)
[facility] EMERGENCY DEPARTMENT Provider Note   CSN: [id number] Arrival date & time: 03/04/22  1149     History  Chief Complaint  Patient presents with   Respiratory Distress    Levi [name] is a 88 y.o. male history pertinent for CAD, COPD, CHF, CKD, HTN, HLD, PAF, ICP, GERD.  Patient arrives via EMS today for shortness of breath and respiratory distress.  EMS no longer at bedside upon my initial evaluation, RN reports that EMS found patient short of breath, fire at bedside patient on 2 L nasal cannula.  Patient was noted to have labored breathing, he was placed on CPAP by EMS and had improvement of symptoms, upon ER arrival he was placed on BiPAP with SPO2 98%.  On my initial evaluation patient is on BiPAP, he reports he is feeling much better, he is in no acute distress.  Patient reports that he has been dealing with shortness of breath for over a year feels he has been getting gradually worse over the past 1 month he denies any sudden worsening of his symptoms he reports he has been compliant with his Lasix and has been producing urine well.  Patient denies any recent infectious symptoms he describes a gradual worsening shortness of breath, it is also worse with exertion, he denies any associated chest pain.  He has no other complaints or concerns at this time.  HPI     Home Medications Prior to Admission medications   Medication Sig Start Date End Date Taking? Authorizing Provider  amiodarone (PACERONE) 100 MG tablet Take 1 tablet (100 mg total) by mouth daily. Patient taking differently: Take 100 mg by mouth daily. Taking in the morning 09/27/21  Yes [name], MD  apixaban (ELIQUIS) 2.5 MG TABS tablet Take 1 tablet (2.5 mg total) by mouth 2 (two) times daily. 01/31/22  Yes [name], MD  cetirizine (ZYRTEC) 10 MG tablet Take 10 mg by mouth daily as needed for allergies or rhinitis.    Yes [provider]  empagliflozin (JARDIANCE) 10 MG  TABS tablet Take 1 tablet (10 mg total) by mouth daily before breakfast. 02/22/22  Yes [name], MD  levothyroxine (SYNTHROID) 112 MCG tablet TAKE 1 TABLET BY MOUTH  DAILY BEFORE BREAKFAST 11/30/21  Yes [name], MD  nitroGLYCERIN (NITROSTAT) 0.4 MG SL tablet Place 1 tablet (0.4 mg total) under the tongue every 5 (five) minutes x 3 doses as needed for chest pain. 03/22/20  Yes Weaver, Scott T, PA-C  polyethylene glycol (MIRALAX / GLYCOLAX) 17 g packet Take 17 g by mouth daily.    Yes [provider]  potassium chloride SA (KLOR-CON) 20 MEQ tablet Take 1.5 tablets (30 mEq total) by mouth daily. Patient taking differently: Take 30 mEq by mouth daily. Taking in the morning 06/15/21  Yes Weaver, Scott T, PA-C  pravastatin (PRAVACHOL) 20 MG tablet TAKE 1 TABLET BY MOUTH  DAILY Patient taking differently: Take 20 mg by mouth daily. 08/31/21  Yes [name], MD  spironolactone (ALDACTONE) 25 MG tablet Take 0.5 tablets (12.5 mg total) by mouth daily. 06/14/21  Yes Weaver, Scott T, PA-C  tamsulosin (FLOMAX) 0.4 MG CAPS capsule Take 0.4 mg by mouth at bedtime.    Yes [provider]  torsemide (DEMADEX) 20 MG tablet Take 3 tablets (60 mg total) by mouth 2 (two) times daily. 05/15/21  Yes [name], MD  methocarbamol (ROBAXIN) 500 MG tablet Take 1 tablet (500 mg  total) by mouth 2 (two) times daily. Patient not taking: Reported on 03/04/2022 09/05/21   Truddie Hidden, MD  pantoprazole (PROTONIX) 40 MG tablet Take 1 tablet (40 mg total) by mouth at bedtime. Patient not taking: Reported on 03/04/2022 10/29/21   [name], MD      Allergies    Patient has no known allergies.    Review of Systems   Review of Systems Ten systems are reviewed and are negative for acute change except as noted in the HPI  Physical Exam Updated Vital Signs BP (!) 152/84   Pulse 70   Temp 98 F (36.7 C) (Axillary)   Resp (!) 24   Ht 5\' 9"  (1.753 m)   Wt 130.6 kg   SpO2 98%   BMI 42.52 kg/m   Physical Exam Constitutional:      General: He is not in acute distress.    Appearance: Normal appearance. He is well-developed. He is obese. He is ill-appearing. He is not toxic-appearing or diaphoretic.     Comments: BiPAP  HENT:     Head: Normocephalic and atraumatic.  Eyes:     General: Vision grossly intact. Gaze aligned appropriately.     Pupils: Pupils are equal, round, and reactive to light.  Neck:     Trachea: Trachea and phonation normal.  Cardiovascular:     Rate and Rhythm: Normal rate and regular rhythm.     Pulses:          Dorsalis pedis pulses are 2+ on the right side and 2+ on the left side.  Pulmonary:     Effort: No respiratory distress.     Breath sounds: Normal air entry. Decreased breath sounds and wheezing present.     Comments: BiPAP Chest:     Chest wall: No tenderness.  Abdominal:     General: There is no distension.     Palpations: Abdomen is soft.     Tenderness: There is no abdominal tenderness. There is no guarding or rebound.  Musculoskeletal:        General: Normal range of motion.     Cervical back: Normal range of motion.     Right lower leg: 1+ Edema present.     Left lower leg: 1+ Edema present.  Skin:    General: Skin is warm and dry.  Neurological:     Mental Status: He is alert.     GCS: GCS eye subscore is 4. GCS verbal subscore is 5. GCS motor subscore is 6.     Comments: Speech is clear and goal oriented, follows commands Major Cranial nerves without deficit, no facial droop Moves extremities without ataxia, coordination intact  Psychiatric:        Behavior: Behavior normal. Behavior is cooperative.     ED Results / Procedures / Treatments   Labs (all labs ordered are listed, but only abnormal results are displayed) Labs Reviewed  BASIC METABOLIC PANEL - Abnormal; Notable for the following components:      Result Value   Glucose, Bld 120 (*)    BUN 26 (*)    Creatinine, Ser 1.71 (*)    GFR, Estimated 37 (*)    All other  components within normal limits  I-STAT CHEM 8, ED - Abnormal; Notable for the following components:   BUN 28 (*)    Creatinine, Ser 1.80 (*)    Glucose, Bld 121 (*)    Calcium, Ion 0.98 (*)    All other components within normal  limits  I-STAT VENOUS BLOOD GAS, ED - Abnormal; Notable for the following components:   pH, Ven 7.528 (*)    pCO2, Ven 40.0 (*)    pO2, Ven 197 (*)    Bicarbonate 33.2 (*)    TCO2 34 (*)    Acid-Base Excess 10.0 (*)    Calcium, Ion 0.98 (*)    All other components within normal limits  TROPONIN I (HIGH SENSITIVITY) - Abnormal; Notable for the following components:   Troponin I (High Sensitivity) 25 (*)    All other components within normal limits  RESP PANEL BY RT-PCR (FLU A&B, COVID) [id number]  CBC WITH DIFFERENTIAL/PLATELET  BRAIN NATRIURETIC PEPTIDE  I-STAT ARTERIAL BLOOD GAS, ED    EKG EKG Interpretation  Date/Time:  Monday March 04 2022 11:57:35 EDT Ventricular Rate:  70 PR Interval:  128 QRS Duration: 173 QT Interval:  489 QTC Calculation: 528 R Axis:   -72 Text Interpretation: Sinus rhythm Ventricular premature complex LVH with IVCD, LAD and secondary repol abnrm Prolonged QT interval Confirmed by Dene Gentry [phone]) on 03/04/2022 12:24:57 PM  Radiology DG Chest Port 1 View  Result Date: 03/04/2022 CLINICAL DATA:  Respiratory distress. EXAM: PORTABLE CHEST 1 VIEW COMPARISON:  Chest radiograph 03/14/2020 and earlier FINDINGS: Left chest wall pacemaker with leads overlying the right atrium and right ventricle. Postoperative changes of median sternotomy and coronary artery bypass graft. Coronary artery stent. Left atrial appendage clip. Stable enlarged cardiac silhouette. Aortic calcifications. New nodular peripheral airspace opacity in the right mid lung. Unchanged bibasilar atelectasis. No pleural effusion or pneumothorax. Anterior cervical spinal fusion. IMPRESSION: New nodular peripheral airspace opacity in the right mid lung may represent  infection in the appropriate clinical context versus new pulmonary nodule. Consider CT chest with intravenous contrast for further evaluation. Stable cardiomegaly.  Aortic Atherosclerosis (ICD10-I70.0). Electronically Signed   By: Ileana Roup M.D.   On: 03/04/2022 12:20    Procedures .Critical Care  Performed by: Deliah Boston, PA-C Authorized by: Deliah Boston, PA-C   Critical care provider statement:    Critical care time (minutes):  35   Critical care was necessary to treat or prevent imminent or life-threatening deterioration of the following conditions:  Respiratory failure   Critical care was time spent personally by me on the following activities:  Development of treatment plan with patient or surrogate, discussions with consultants, evaluation of patient's response to treatment, examination of patient, ordering and review of laboratory studies, ordering and review of radiographic studies, ordering and performing treatments and interventions, pulse oximetry, re-evaluation of patient's condition and review of old charts     Medications Ordered in ED Medications  furosemide (LASIX) injection 40 mg (40 mg Intravenous Given 03/04/22 1251)    ED Course/ Medical Decision Making/ A&P Clinical Course as of 03/04/22 1330  Mon Mar 04, 2022  1202 Initial assessment patient feeling improved on BiPAP.  SPO2 100%.  Reports gradually worsening shortness of breath over the past 1 month, no associated chest pain.  Shortness of breath work-up initiated. [BM]  1212 DG Chest Port 1 View  Chest x-ray appears as fluid overload.  Awaiting official read as well as labs.  Patient seen and evaluated by Dr. Francia Greaves as well.  Patient without infectious symptoms low suspicion for infectious process at this point. [BM]  1300 Patient reassessed he is resting comfortably in bed no acute distress remains on BiPAP, his daughter Shauna Hugh is at bedside.  Patient reports that he feels well, vital signs stable. [BM]  1301 I-stat chem 8, ED (not at [facility] or [facility])(!) I-STAT Chem-8 without emergent electrolyte derangements.  Bicarb within normal limits. [BM]  1301 CBC with Differential CBC within normal limits, no leukocytosis to suggest infectious process, no anemia or thrombocytopenia. [BM]  1302 Troponin I (High Sensitivity)(!) Troponin slightly elevated 25, will need delta troponin.   [BM]  4010 Basic metabolic panel(!) BMP shows creatinine of 1.71 which appears baseline.  No emergent electrolyte derangement or gap [BM]  1314 I-Stat venous blood gas, [facility] ED only)(!) VBG without acidosis, pH 7.528.  Bicarb 33.2 [BM]    Clinical Course User Index [BM] Gari Crown                           Medical Decision Making 88 year old male with history was found in respiratory distress by EMS, he has had improvement on CPAP prior to arrival on my initial evaluation patient is on BiPAP and reports improvement of symptoms.  This appears to be a gradually worsening problem over the past month the patient denies any sudden worsening of his shortness of breath he denies any associated chest pain.  Patient has diminished breath sounds in some wheezing on examination, overall appears fluid overloaded.  He reports compliance with his Lasix with good urine output at home.  Shortness of breath work-up initiated after initial evaluation.  Patient seen and evaluated with Dr. Francia Greaves.  Amount and/or Complexity of Data Reviewed External Data Reviewed: notes.    Details: Cardiology and outpatient notes. Labs: ordered. Decision-making details documented in ED Course. Radiology: ordered. Decision-making details documented in ED Course. ECG/medicine tests: ordered.  Risk Prescription drug management. Decision regarding hospitalization. Risk Details: Patient is remained stable throughout ED visit on BiPAP.  Patient and his daughter at bedside are both agreeable to hospital mission at this point.  He has been given IV  Lasix for suspected fluid overload as cause of respiratory distress and remains on BiPAP.  Consult placed to hospitalist team.  Critical Care Total time providing critical care: 35 minutes  1:25 PM: Consult with hospitalist Dr. Lorin Mercy, patient accepted for admission.  1:26 PM: Patient reassessed he remained stable on BiPAP, his daughter Shauna Hugh is at bedside.  Patient reports he is feeling well at this time.  [name] cooperates history as above reports this appears to be a chronic and gradually worsening issue without acute change.  They are both agreeable for hospital admission for further management.  Patient was seen and evaluated by Dr. Francia Greaves during this visit who agrees with management and admission at this point.  Note: Portions of this report may have been transcribed using voice recognition software. Every effort was made to ensure accuracy; however, inadvertent computerized transcription errors may still be present.         Final Clinical Impression(s) / ED Diagnoses Final diagnoses:  Respiratory distress    Rx / DC Orders ED Discharge Orders     None         Gari Crown 03/04/22 1331    Valarie Merino, MD 03/05/22 1209

## 2022-03-04 NOTE — Progress Notes (Signed)
RT unable to obtain ABG. Dr. Lorin Mercy and B. Wiley PA notified. No new orders received at this time. RT will continue to monitor and be available as needed.

## 2022-03-05 ENCOUNTER — Inpatient Hospital Stay (HOSPITAL_COMMUNITY): Payer: Medicare Other

## 2022-03-05 DIAGNOSIS — I480 Paroxysmal atrial fibrillation: Secondary | ICD-10-CM

## 2022-03-05 DIAGNOSIS — Z950 Presence of cardiac pacemaker: Secondary | ICD-10-CM

## 2022-03-05 DIAGNOSIS — G4733 Obstructive sleep apnea (adult) (pediatric): Secondary | ICD-10-CM

## 2022-03-05 DIAGNOSIS — Z951 Presence of aortocoronary bypass graft: Secondary | ICD-10-CM

## 2022-03-05 DIAGNOSIS — I10 Essential (primary) hypertension: Secondary | ICD-10-CM

## 2022-03-05 DIAGNOSIS — I5043 Acute on chronic combined systolic (congestive) and diastolic (congestive) heart failure: Secondary | ICD-10-CM | POA: Diagnosis not present

## 2022-03-05 DIAGNOSIS — E039 Hypothyroidism, unspecified: Secondary | ICD-10-CM

## 2022-03-05 DIAGNOSIS — N1832 Chronic kidney disease, stage 3b: Secondary | ICD-10-CM

## 2022-03-05 DIAGNOSIS — E785 Hyperlipidemia, unspecified: Secondary | ICD-10-CM

## 2022-03-05 LAB — BASIC METABOLIC PANEL
Anion gap: 13 (ref 5–15)
BUN: 28 mg/dL — ABNORMAL HIGH (ref 8–23)
CO2: 29 mmol/L (ref 22–32)
Calcium: 8.7 mg/dL — ABNORMAL LOW (ref 8.9–10.3)
Chloride: 99 mmol/L (ref 98–111)
Creatinine, Ser: 1.77 mg/dL — ABNORMAL HIGH (ref 0.61–1.24)
GFR, Estimated: 36 mL/min — ABNORMAL LOW (ref >60)
Glucose, Bld: 107 mg/dL — ABNORMAL HIGH (ref 70–99)
Potassium: 3.9 mmol/L (ref 3.5–5.1)
Sodium: 141 mmol/L (ref 135–145)

## 2022-03-05 LAB — ECHOCARDIOGRAM COMPLETE
AR max vel: 1.8 cm2
AV Area VTI: 1.83 cm2
AV Area mean vel: 1.67 cm2
AV Mean grad: 4 mmHg
AV Peak grad: 7.1 mmHg
Ao pk vel: 1.33 m/s
Area-P 1/2: 4.89 cm2
Height: 69 in
S' Lateral: 3.9 cm
Weight: 4507.97 oz

## 2022-03-05 LAB — CBC
HCT: 43.6 % (ref 39.0–52.0)
Hemoglobin: 13.8 g/dL (ref 13.0–17.0)
MCH: 30.9 pg (ref 26.0–34.0)
MCHC: 31.7 g/dL (ref 30.0–36.0)
MCV: 97.8 fL (ref 80.0–100.0)
Platelets: 230 10*3/uL (ref 150–400)
RBC: 4.46 MIL/uL (ref 4.22–5.81)
RDW: 15 % (ref 11.5–15.5)
WBC: 8.2 10*3/uL (ref 4.0–10.5)
nRBC: 0 % (ref 0.0–0.2)

## 2022-03-05 MED ORDER — PERFLUTREN LIPID MICROSPHERE
1.0000 mL | INTRAVENOUS | Status: AC | PRN
  Administered 2022-03-05: 2 mL via INTRAVENOUS

## 2022-03-05 MED ORDER — HYDRALAZINE HCL 50 MG PO TABS
50.0000 mg | ORAL_TABLET | Freq: Two times a day (BID) | ORAL | Status: DC
  Administered 2022-03-05 – 2022-03-09 (×8): 50 mg via ORAL
  Filled 2022-03-05 (×8): qty 1

## 2022-03-05 NOTE — Assessment & Plan Note (Signed)
Continue with statin therapy.  ?

## 2022-03-05 NOTE — Assessment & Plan Note (Signed)
Renal function with serum cr at 1,7 with K at 3,9 and serum bicarbonate at 29.  Continue diuresis with furosemide and follow up renal function as outpatient.

## 2022-03-05 NOTE — Progress Notes (Signed)
PT Note:     SATURATION QUALIFICATIONS: (This note is used to comply with regulatory documentation for home oxygen)  Patient Saturations on Room Air at Rest = 91%  Patient Saturations on Room Air while Ambulating = 85%  Patient Saturations on 3 Liters of oxygen while Ambulating = 89%  Please briefly explain why patient needs home oxygen:Pt O2 drops significantly when ambulating on RA and pt becomes fatigued and unable to speak. Tolerates mobility much better with supplemental O2.   Leighton Roach, PT  Acute Rehab Services Secure chat preferred Office [phone]

## 2022-03-05 NOTE — Assessment & Plan Note (Signed)
No chest pain, no acute coronary syndrome.

## 2022-03-05 NOTE — TOC Initial Note (Signed)
Transition of Care [facility]) - Initial/Assessment Note    Patient Details  Name: Levi [name] MRN: [id number] Date of Birth: [birth year]  [facility] CM/SW Contact:    Pollie Friar, RN Phone Number: 03/05/2022, 3:13 PM  Clinical Narrative:                 Patient is from home alone but his daughter lives next door. She checks on him daily as does his sons.  Pt manages his own medications and denies any issues.  Family provides needed transportation.  Recommendations for [facility] services. Pt had no preference. Cm has set up home heath with [facility]. Information on the AVS.  TOC following for potential oxygen needs at d/c.   Expected Discharge Plan: [place] Barriers to Discharge: Continued Medical Work up   Patient Goals and CMS Choice   CMS Medicare.gov Compare Post Acute Care list provided to:: Patient Choice offered to / list presented to : Patient, Adult Children  Expected Discharge Plan and Services Expected Discharge Plan: [place]   Discharge Planning Services: CM Consult Post Acute Care Choice: [place] arrangements for the past 2 months: Single Family Home                           HH Arranged: RN, PT, OT [facility] Agency: [place] Date [facility] Agency Contacted: 03/05/22   Representative spoke with at [place]: Hoyle Sauer  Prior Living Arrangements/Services Living arrangements for the past 2 months: [place] Lives with:: Self Patient language and need for interpreter reviewed:: Yes Do you feel safe going back to the place where you live?: Yes          Current home services: DME (CPAP, walker, cane, rollator x2, shower seat) Criminal Activity/Legal Involvement Pertinent to Current Situation/Hospitalization: No - Comment as needed  Activities of Daily Living Home Assistive Devices/Equipment: Cane (specify quad or straight), Eyeglasses, Shower chair with back ADL Screening  (condition at time of admission) Patient's cognitive ability adequate to safely complete daily activities?: Yes Is the patient deaf or have difficulty hearing?: Yes Does the patient have difficulty seeing, even when wearing glasses/contacts?: No Does the patient have difficulty concentrating, remembering, or making decisions?: No Patient able to express need for assistance with ADLs?: Yes Does the patient have difficulty dressing or bathing?: Yes Independently performs ADLs?: Yes (appropriate for developmental age) Does the patient have difficulty walking or climbing stairs?: No Weakness of Legs: Both Weakness of Arms/Hands: None  Permission Sought/Granted                  Emotional Assessment Appearance:: Appears stated age Attitude/Demeanor/Rapport: Engaged Affect (typically observed): Accepting Orientation: : Oriented to Self, Oriented to Place, Oriented to  Time, Oriented to Situation   Psych Involvement: No (comment)  Admission diagnosis:  Respiratory distress [R06.03] Acute on chronic combined systolic and diastolic CHF (congestive heart failure) (HCC) [I50.43] Patient Active Problem List   Diagnosis Date Noted   Acute on chronic combined systolic and diastolic CHF (congestive heart failure) ([place]) 03/04/2022   Acute kidney injury superimposed on CKD ([place]) 03/14/2020   Hypokalemia 03/14/2020   Dizziness 03/17/2019   Acute on chronic combined systolic (congestive) and diastolic (congestive) heart failure ([place]) 03/15/2019   Chronic kidney disease, stage 3b ([place]) 10/09/2018   Ischemic cardiomyopathy 10/06/2018   Presence of permanent cardiac pacemaker 06/08/2018   Orthostatic hypotension  08/14/2017   Chronic combined systolic and diastolic CHF (congestive heart failure) ([place]) 06/09/2017   Obesity (BMI 30-39.9) 02/19/2017   OSA (obstructive sleep apnea) 02/19/2017   Decreased hearing of both ears 12/24/2016   Depression, reactive 12/06/2016   Mobitz type 2 second degree  atrioventricular block 10/05/2015   Chronic anticoagulation 09/19/2015   Bifascicular block 09/19/2015   Bradycardia 09/19/2015   Syncope 06/30/2014   Lumbosacral spondylosis 02/21/2014   Personal history of nicotine dependence 10/29/2013   Personal history of tobacco use, presenting hazards to health 10/29/2013   H/O coronary artery bypass surgery 10/22/2012   Angina, class III ([place]) 10/15/2012   CA of prostate ([place]) 07/09/2012   Gastro-esophageal reflux disease with esophagitis 07/09/2012   Allergic rhinitis 06/15/2012   Back pain 10/24/2011   CAD (coronary artery disease) 06/05/2011   Chronic coronary artery disease 06/05/2011   PAF (paroxysmal atrial fibrillation) ([place]) 09/20/2010   PROSTATE CANCER 12/27/2009   ADENOMATOUS COLONIC POLYP 12/27/2009   HLD (hyperlipidemia) 12/27/2009   Morbid obesity ([place]) 12/27/2009   Essential hypertension 12/27/2009   EMPHYSEMA 12/27/2009   OSTEOPENIA 12/27/2009   MALAISE AND FATIGUE 12/27/2009   Heart failure with preserved ejection fraction ([place]) 12/27/2009   CHEST PAIN UNSPECIFIED 12/27/2009   History of colonic polyps 12/04/2009   PCP:  Bernerd Limbo, MD Pharmacy:   [facility] ([place], [place] [facility] [address] [place] [address] [place] [address] Phone: [phone] Fax: [phone]  [facility] ([facility] ) - [place], [place] [address] Phone: [phone] Fax: [phone]     Social Determinants of Health (SDOH) Interventions    Readmission Risk Interventions     No data to display

## 2022-03-05 NOTE — Progress Notes (Signed)
  Echocardiogram 2D Echocardiogram has been performed.  Levi [name] F 03/05/2022, 1:47 PM

## 2022-03-05 NOTE — Assessment & Plan Note (Addendum)
Echocardiogram with LV systolic function preserved with EF 50 to 55%, apical segment and apex are akinetic. RV with mild enlargement,  preserved systolic function, RA moderate dilatation, severe tricuspid regurgitation   Urine output 2,025 ml  Blood pressure systolic 155 to 208 mmHg.   Plan to continue diuresis with furosemide and spironolactone.  Add hydralazine for blood pressure control Holding on further RAS inhibition due to decreased GFR.   Acute hypoxemic respiratory failure.  Patient now of bipap and transitioned to nasal cannula. Continue diuresis and supplemental -02 per [place], uses Cpap at night.  No clinical signs of pneumonia, continue to hold on antibiotic therapy and plan to repeat chest film after aggressive diuresis.

## 2022-03-05 NOTE — Assessment & Plan Note (Signed)
Calculated BMI is 41,6

## 2022-03-05 NOTE — Assessment & Plan Note (Signed)
Continue rate control with amiodarone and anticoagulation with apixaban. Continue telemetry monitoring.

## 2022-03-05 NOTE — Assessment & Plan Note (Signed)
Ventricular paced rhythm.

## 2022-03-05 NOTE — Progress Notes (Addendum)
Progress Note   Patient: Levi [name] FIE:[id number] DOB: [birth year] DOA: 03/04/2022     1 DOS: the patient was seen and examined on 03/05/2022   Brief hospital course: [name] was admitted to the hospital with the working diagnosis of heart failure.   88 yo male with the past medical history of CAD sp CABG, heart failure, COPD, hypertension, atrial fibrillation, pace maker implantation, prostate cancer, and obesity class 3 who presented with dyspnea. Reported worsening dyspnea for 5 day, to the point patient was not able to speak in full sentences. Positive orthopnea, PND and lower extremity edema. On his initial physical examination he was in respiratory distress and was placed on Bipap, his blood pressure was 131/40, RR 30, HR 70 and 02 saturation 97%,  lungs with no wheezing or rhonchi, heart with S1 and S2 present and rhythmic, abdomen not distended, positive lower extremity edema.   Na 138, K 4,0 CL 100 bicarbonate 28 glucose 120 bun 26 cr 1,71 BNP 56  High sensitive troponin 25 and 26  Wbc 9,5 hgb 14.2 plt 228  Sars covid 19 negative   Chest radiograph with cardiomegaly,left lower lobe with increased lung marking, right upper lobe peripheral round opacity.   EKG 70 bpm, left axis deviation, left bundle branch block, qtc 520, ventricular paced rhythm, with no significant ST segment or T wave changes.   Assessment and Plan: * Acute on chronic combined systolic and diastolic CHF (congestive heart failure) (HCC) Echocardiogram with LV systolic function preserved with EF 50 to 55%, apical segment and apex are akinetic. RV with mild enlargement,  preserved systolic function, RA moderate dilatation, severe tricuspid regurgitation   Urine output 2,025 ml  Blood pressure systolic 166 to 063 mmHg.   Plan to continue diuresis with furosemide and spironolactone.  Add hydralazine for blood pressure control Holding on further RAS inhibition due to decreased GFR.   Acute hypoxemic  respiratory failure.  Patient now of bipap and transitioned to nasal cannula. Continue diuresis and supplemental -02 per [place], uses Cpap at night.  No clinical signs of pneumonia, continue to hold on antibiotic therapy and plan to repeat chest film after aggressive diuresis.   Essential hypertension Continue blood pressure has been elevated. Will add hydralazine bid for afterload reduction.    Chronic kidney disease, stage 3b ([place]) Renal function with serum cr at 1,7 with K at 3,9 and serum bicarbonate at 29.  Continue diuresis with furosemide and follow up renal function as outpatient.   PAF (paroxysmal atrial fibrillation) (HCC) Continue rate control with amiodarone and anticoagulation with apixaban. Continue telemetry monitoring.   H/O coronary artery bypass surgery No chest pain, no acute coronary syndrome.   HLD (hyperlipidemia) Continue with statin therapy.   OSA (obstructive sleep apnea) Continue with cpap.   Class 3 obesity (HCC) Calculated BMI is 41,6  Presence of permanent cardiac pacemaker Ventricular paced rhythm.   Hypothyroidism Continue with levothyroxine         Subjective: Patient is feeling better, dyspnea has improved but not back to baseline.   Physical Exam: Vitals:   03/05/22 0439 03/05/22 0755 03/05/22 1026 03/05/22 1247  BP: 103/80 (!) 163/72 136/60 132/68  Pulse: 73 73 70 70  Resp: 20  18 18   Temp: 97.7 F (36.5 C)  97.6 F (36.4 C) 97.8 F (36.6 C)  TempSrc: Oral  Oral Oral  SpO2: 91% 97%  94%  Weight:      Height:       Neurology awake  and alert ENT with no pallor Cardiovascular with S1 and S2 present, irregularly irregular with no gallops, positive systolic murmur at the lower right sternal border.  Mild JVD Positive lower extremity edema ++ Respiratory with bilateral expiratory wheezing on anterior auscultation Abdomen protuberant but not distended.  Data Reviewed:    Family Communication: I spoke with patient's daughter  at the bedside, we talked in detail about patient's condition, plan of care and prognosis and all questions were addressed.   Disposition: Status is: Inpatient Remains inpatient appropriate because: IV diuresis  Planned Discharge Destination: Home    Author: Tawni Millers, MD 03/05/2022 3:42 PM  For on call review [email or web address].

## 2022-03-05 NOTE — Progress Notes (Signed)
Nutrition Brief Note  Consult received for nutrition assessment.  Pt reports no recent weight changes but his scale has not been working so he is no longer weighing himself daily. He lives alone but daughter lives beside him and he often visits a friend during the day and they eat lunch and supper together. He no longer uses the salt shaker. Breakfast is cereal with fruit with one cup of black coffee, lunch is usually out and supper is most often something light. He is always home by 8 pm.    Wt Readings from Last 15 Encounters:  03/05/22 127.8 kg  12/20/21 130.6 kg  10/03/21 131.1 kg  09/05/21 129.7 kg  07/31/21 133.9 kg  06/12/21 133.9 kg  02/02/21 133.4 kg  01/23/21 (!) 137.2 kg  11/21/20 (!) 139.3 kg  08/04/20 135.7 kg  05/01/20 (!) 137.9 kg  03/20/20 135.4 kg  03/14/20 127.9 kg  12/08/19 135.2 kg  09/20/19 135.9 kg    Body mass index is 41.61 kg/m. Patient meets criteria for morbid obesity based on current BMI.   Current diet order is Heart Healthy with 1500 ml fluid restriction, patient is consuming approximately 10% of meals at this time. Labs and medications reviewed.   No nutrition interventions warranted at this time. If nutrition issues arise, please consult RD.   Lockie Pares., RD, LDN, CNSC See AMiON for contact information    Lockie Pares., RD, LDN, CNSC See AMiON for contact information

## 2022-03-05 NOTE — Assessment & Plan Note (Signed)
Continue with cpap.

## 2022-03-05 NOTE — Evaluation (Signed)
Physical Therapy Evaluation Patient Details Name: Levi [name] MRN: [id number] DOB: 30-May-1929 Today's Date: 03/05/2022  History of Present Illness  Pt is 88 yo male who presents to [facility] on 03/04/22 from home alone with respiratory distress. PMH: CAD s/p CABG, CHF, COPD, HTN, HLD, morbid obesity, afib, pacer, prostate cancer.  Clinical Impression  Pt admitted with above diagnosis. Pt received in bed on RA with SPO2 low 90's. Pt unable to sit straight up in bed but able to get self to edge when cued to roll first and then sit up. Daughter relays that this is why pt mostly sleeps in his lift chair. Pt SpO2 dropped to 85% when ambulating 60' on RA and pt with loss of color in face and inability to speak. On 3L O2 pt was able to ambulate 100' with RW and maintained good color and could speak though with 2/4 DOE. SPO2 remained >88%. Recommend O2 for home as well as HHPT. Pt agreeable.  Pt currently with functional limitations due to the deficits listed below (see PT Problem List). Pt will benefit from skilled PT to increase their independence and safety with mobility to allow discharge to the venue listed below.          Recommendations for follow up therapy are one component of a multi-disciplinary discharge planning process, led by the attending physician.  Recommendations may be updated based on patient status, additional functional criteria and insurance authorization.  Follow Up Recommendations Home health PT      Assistance Recommended at Discharge Intermittent Supervision/Assistance  Patient can return home with the following  Assistance with cooking/housework;Help with stairs or ramp for entrance;Assist for transportation    Equipment Recommendations None recommended by PT  Recommendations for Other Services       Functional Status Assessment Patient has had a recent decline in their functional status and demonstrates the ability to make significant improvements in function in a  reasonable and predictable amount of time.     Precautions / Restrictions Precautions Precautions: Other (comment) Precaution Comments: watch O2 sats Restrictions Weight Bearing Restrictions: No      Mobility  Bed Mobility Overal bed mobility: Needs Assistance Bed Mobility: Sidelying to Sit, Rolling Rolling: Supervision Sidelying to sit: Supervision       General bed mobility comments: pt worked LE's off bed and was then on his back and could not come to sitting independently. Had him bring LE's back into bed and roll, then bring LE's off bed, and sit up and he was able to do so with supervision    Transfers Overall transfer level: Needs assistance Equipment used: Rolling walker (2 wheels) Transfers: Sit to/from Stand Sit to Stand: Min guard           General transfer comment: vc's for hand placement, increased time and effort needed, min guard A for safety    Ambulation/Gait Ambulation/Gait assistance: Min assist Gait Distance (Feet): 160 Feet (60, seated rest break, 100') Assistive device: Rolling walker (2 wheels) Gait Pattern/deviations: Step-through pattern, Wide base of support Gait velocity: decreased Gait velocity interpretation: 1.31 - 2.62 ft/sec, indicative of limited community ambulator   General Gait Details: pt ambulated 60' on RA, SPO2 dropped to low 80's, pt with loss of color in face and unable to speak. Ambulated 100' on 3L O2 with min guard A and RW with sats >88%. Pt fatigued by end and could not speak but maintained color in face  Stairs  Wheelchair Mobility    Modified Rankin (Stroke Patients Only)       Balance Overall balance assessment: Mild deficits observed, not formally tested                                           Pertinent Vitals/Pain Pain Assessment Pain Assessment: No/denies pain    Home Living Family/patient expects to be discharged to:: Private residence Living Arrangements:  Alone Available Help at Discharge: Family;Available 24 hours/day Type of Home: House Home Access: Ramped entrance   Entrance Stairs-Number of Steps: 2   Home Layout: One level Home Equipment: Tub bench;Rolling Walker (2 wheels);Rollator (4 wheels);Cane - single point;Grab bars - toilet;Grab bars - tub/shower (lift chair) Additional Comments: family can be with him 24/7. Sleeps in lift chair 99% of time. Uses 2 wheel RW in house, cane out of house    Prior Function Prior Level of Function : Independent/Modified Independent;Driving             Mobility Comments: drives to visit his girlfriend. Uses AD only when he feels he has to ADLs Comments: dons compression stockings independently     Hand Dominance   Dominant Hand: Right    Extremity/Trunk Assessment   Upper Extremity Assessment Upper Extremity Assessment: Defer to OT evaluation    Lower Extremity Assessment Lower Extremity Assessment: Generalized weakness    Cervical / Trunk Assessment Cervical / Trunk Assessment: Kyphotic  Communication   Communication: No difficulties;HOH  Cognition Arousal/Alertness: Awake/alert Behavior During Therapy: WFL for tasks assessed/performed Overall Cognitive Status: Within Functional Limits for tasks assessed                                          General Comments General comments (skin integrity, edema, etc.): discussed with daughter having a pulse ox for home use    Exercises     Assessment/Plan    PT Assessment Patient needs continued PT services  PT Problem List Decreased strength;Decreased activity tolerance;Decreased mobility;Cardiopulmonary status limiting activity;Decreased knowledge of use of DME;Decreased knowledge of precautions       PT Treatment Interventions DME instruction;Gait training;Functional mobility training;Therapeutic activities;Therapeutic exercise;Balance training;Patient/family education    PT Goals (Current goals can be  found in the Care Plan section)  Acute Rehab PT Goals Patient Stated Goal: return home, breathe better PT Goal Formulation: With patient Time For Goal Achievement: 03/19/22 Potential to Achieve Goals: Good    Frequency Min 3X/week     Co-evaluation               AM-PAC PT "6 Clicks" Mobility  Outcome Measure Help needed turning from your back to your side while in a flat bed without using bedrails?: None Help needed moving from lying on your back to sitting on the side of a flat bed without using bedrails?: None Help needed moving to and from a bed to a chair (including a wheelchair)?: None Help needed standing up from a chair using your arms (e.g., wheelchair or bedside chair)?: A Little Help needed to walk in hospital room?: A Little Help needed climbing 3-5 steps with a railing? : A Lot 6 Click Score: 20    End of Session Equipment Utilized During Treatment: Gait belt;Oxygen Activity Tolerance: Patient limited by fatigue Patient left: in bed;with call  bell/phone within reach;with family/visitor present Nurse Communication: Mobility status PT Visit Diagnosis: Unsteadiness on feet (R26.81);Difficulty in walking, not elsewhere classified (R26.2)    Time: 4388-8757 PT Time Calculation (min) (ACUTE ONLY): 37 min   Charges:   PT Evaluation $PT Eval Moderate Complexity: 1 Mod PT Treatments $Gait Training: 8-22 mins        Leighton Roach, PT  Acute Rehab Services Secure chat preferred Office [place] 03/05/2022, 1:43 PM

## 2022-03-05 NOTE — Assessment & Plan Note (Signed)
Continue with levothyroxine  

## 2022-03-05 NOTE — Hospital Course (Signed)
Levi [name] was admitted to the hospital with the working diagnosis of heart failure.   88 yo male with the past medical history of CAD sp CABG, heart failure, COPD, hypertension, atrial fibrillation, pace maker implantation, prostate cancer, and obesity class 3 who presented with dyspnea. Reported worsening dyspnea for 5 days, to the point patient where he was not able to speak in full sentences. Positive orthopnea, PND and lower extremity edema. On his initial physical examination he was in respiratory distress and was placed on Bipap, his blood pressure was 131/40, RR 30, HR 70 and 02 saturation 97%,  lungs with no wheezing or rhonchi, heart with S1 and S2 present and rhythmic, abdomen not distended, positive lower extremity edema.   Na 138, K 4,0 CL 100 bicarbonate 28 glucose 120 bun 26 cr 1,71 BNP 56  High sensitive troponin 25 and 26  Wbc 9,5 hgb 14.2 plt 228  Sars covid 19 negative   Chest radiograph with cardiomegaly,left lower lobe with increased lung marking, right upper lobe peripheral round opacity.   EKG 70 bpm, left axis deviation, left bundle branch block, qtc 520, ventricular paced rhythm, with no significant ST segment or T wave changes.   Patient has been placed on IV furosemide with good toleration.

## 2022-03-05 NOTE — Assessment & Plan Note (Signed)
Improved blood pressure with hydralazine, continue diuresis with furosemide and spironolactone.  Not able to start on ARB or ARNI due to low GFR.

## 2022-03-06 LAB — MAGNESIUM: Magnesium: 2.7 mg/dL — ABNORMAL HIGH (ref 1.7–2.4)

## 2022-03-06 LAB — BASIC METABOLIC PANEL
Anion gap: 11 (ref 5–15)
BUN: 28 mg/dL — ABNORMAL HIGH (ref 8–23)
CO2: 29 mmol/L (ref 22–32)
Calcium: 8.9 mg/dL (ref 8.9–10.3)
Chloride: 99 mmol/L (ref 98–111)
Creatinine, Ser: 1.74 mg/dL — ABNORMAL HIGH (ref 0.61–1.24)
GFR, Estimated: 37 mL/min — ABNORMAL LOW (ref >60)
Glucose, Bld: 93 mg/dL (ref 70–99)
Potassium: 4.3 mmol/L (ref 3.5–5.1)
Sodium: 139 mmol/L (ref 135–145)

## 2022-03-06 MED ORDER — EMPAGLIFLOZIN 10 MG PO TABS
10.0000 mg | ORAL_TABLET | Freq: Every day | ORAL | Status: DC
  Administered 2022-03-06 – 2022-03-09 (×4): 10 mg via ORAL
  Filled 2022-03-06 (×4): qty 1

## 2022-03-06 NOTE — Progress Notes (Signed)
Mobility Specialist: Progress Note   03/06/22 1012  Mobility  Activity Ambulated with assistance in hallway  Level of Assistance Standby assist, set-up cues, supervision of patient - no hands on  Assistive Device Front wheel walker  Distance Ambulated (ft) 200 ft  Activity Response Tolerated well  $Mobility charge 1 Mobility   Pre-Mobility: 71 HR, 92% SpO2 During Mobility: 92% SpO2 Post-Mobility: 82 HR, 92% SpO2  Pt received in the chair and agreeable to mobility. Stopped several times during ambulation for brief stand breaks secondary to fatigue and SOB. No c/o pain or dizziness. Pt back to the chair after session with call bell in reach.   [facility] Levi [name] Mobility Specialist Mobility Specialist [address]: [phone]

## 2022-03-06 NOTE — Telephone Encounter (Signed)
Received fax from [facility] has been approved to receive medication through the program from 09/26/21 - 09/01/22 unless their circumstances change.   Each time the pt needs a new supply of medication , it can be re-ordered by contacting [facility] at [phone] Monday-Friday 8:30am to 6:00pm.

## 2022-03-06 NOTE — Progress Notes (Signed)
Heart Failure Stewardship Pharmacist Progress Note   PCP: Bernerd Limbo, MD PCP-Cardiologist: [name], MD    HPI:  88 yo M with PMH of CAD s/p CABG, CHF, COPD, HTN, afib, PPM, prostate cancer, and obesity. He presented to the ED on 7/3 with respiratory distress, edema, and orthopnea. CXR with new nodular peripheral airspace opacity in the right mid lung may represent infection in the appropriate clinical context versus new pulmonary nodule. Admitted for acute CHF. ECHO on 7/4 with LVEF 50-55%, RV normal. Prior ECHO in 03/2020 with LVEF 45-50%, 09/2018 with LVEF 40-45%, 06/2016 with LVEF 50-55%.   Current HF Medications: Diuretic: furosemide 40 mg IV BID Aldosterone Antagonist: spironolactone 12.5 mg daily Other: hydralazine 50 mg BID  Prior to admission HF Medications: Diuretic: torsemide 60 mg BID Aldosterone Antagonist: spironolactone 12.5 mg daily SGLT2i: Jardiance 10 mg daily  Pertinent Lab Values: Serum creatinine 1.74, BUN 28, Potassium 4.3, Sodium 139, BNP 56.3, Magnesium 2.7   Vital Signs: Weight: 276 lbs (admission weight: 281 lbs) Blood pressure: 130/70s  Heart rate: 70s - V paced  I/O: -2.1L yesterday; net -2.9L  Medication Assistance / Insurance Benefits Check: Does the patient have prescription insurance?  Yes Type of insurance plan: AARP Medicare  Does the patient qualify for medication assistance through manufacturers or grants?   Yes Eligible grants and/or patient assistance programs: [employer]ne [employer] Medication assistance applications in progress: none  Medication assistance applications approved: Jardiance Approved medication assistance renewals will be completed by: [place]:  Prior to admission outpatient pharmacy: [facility] Is the patient willing to use [facility] at discharge? Yes Is the patient willing to transition their outpatient pharmacy to utilize a [facility] outpatient pharmacy?   Pending    Assessment: 1.  Acute on chronic systolic and diastolic CHF (LVEF 30-07%). NYHA class III symptoms. - Continue furosemide 40 mg IV BID - output good yesterday, weight down. Keep K>4 and Mag>2. - Continue spironolactone 12.5 mg daily - Agree with starting hydralazine 50 mg BID for afterload reduction - Consider resuming Jardiance once AKI resolved (baseline ~1.4)   Plan: 1) Medication changes recommended at this time: - Agree with changes  2) Patient assistance: - Lind Guest for $0 from [place]  3)  Education  - To be completed prior to discharge  Kerby Nora, PharmD, BCPS Heart Failure Cytogeneticist Phone [phone]

## 2022-03-06 NOTE — Evaluation (Signed)
Occupational Therapy Evaluation Patient Details Name: Levi [name] MRN: [id number] DOB: 30-Jan-1929 Today's Date: 03/06/2022   History of Present Illness Pt is 88 yo male who presents to [facility] on 03/04/22 from home alone with respiratory distress. PMH: CAD s/p CABG, CHF, COPD, HTN, HLD, morbid obesity, afib, pacer, prostate cancer.   Clinical Impression   Pt PTA: Pt living independently for ADL and iADL; has his daughter come to his appointments; grandson lives with him, but pt reports that he does not need assist with tasks at home.  Pt currently,  Pt continues to sit for tasks  >4 mins of standing. Pt tolerating 3 mins of standing at sink for grooming; simulating tub transfer with RW and sink as grab bar. Pt takes increased time to bend over to fix his sock that was sliding off.  Education on energy conservation strategies and copy of paper left in room. O2 required>86% O2 on 3L with exertion; increased  >90% with 1 min rest. Pt would benefit from continued OT skilled services in post acute care setting. OT signing off.     Recommendations for follow up therapy are one component of a multi-disciplinary discharge planning process, led by the attending physician.  Recommendations may be updated based on patient status, additional functional criteria and insurance authorization.   Follow Up Recommendations  Home health OT    Assistance Recommended at Discharge Set up Supervision/Assistance  Patient can return home with the following A little help with bathing/dressing/bathroom    Functional Status Assessment  Patient has had a recent decline in their functional status and demonstrates the ability to make significant improvements in function in a reasonable and predictable amount of time.  Equipment Recommendations  None recommended by OT    Recommendations for Other Services       Precautions / Restrictions Precautions Precautions: Other (comment) Precaution Comments: watch O2  sats Restrictions Weight Bearing Restrictions: No      Mobility Bed Mobility Overal bed mobility: Needs Assistance Bed Mobility: Sidelying to Sit   Sidelying to sit: Supervision       General bed mobility comments: no physical assist    Transfers Overall transfer level: Needs assistance Equipment used: Rolling walker (2 wheels) Transfers: Sit to/from Stand Sit to Stand: Supervision           General transfer comment: verbal cues for hnad placement rocked a few times for momentum      Balance Overall balance assessment: Mild deficits observed, not formally tested                                         ADL either performed or assessed with clinical judgement   ADL Overall ADL's : At baseline                                       General ADL Comments: Pt continues to sit for tasks  >4 mins of standing. Pt tolerating 3 mins of standing at sink for grooming; simulating tub transfer with RW and sink as grab bar. Pt takes increased time to bend over to fix his sock that was sliding off. Pt reports that he is independent at home. His grandson lives with him and his daughter is avaialble to assist for appointments and groceries.     Vision  Baseline Vision/History: 1 Wears glasses Ability to See in Adequate Light: 0 Adequate Patient Visual Report: No change from baseline Vision Assessment?: No apparent visual deficits     Perception     Praxis      Pertinent Vitals/Pain Pain Assessment Pain Assessment: No/denies pain     Hand Dominance Right   Extremity/Trunk Assessment Upper Extremity Assessment Upper Extremity Assessment: Overall WFL for tasks assessed   Lower Extremity Assessment Lower Extremity Assessment: Defer to PT evaluation;Generalized weakness   Cervical / Trunk Assessment Cervical / Trunk Assessment: Kyphotic   Communication Communication Communication: No difficulties;HOH   Cognition Arousal/Alertness:  Awake/alert Behavior During Therapy: WFL for tasks assessed/performed Overall Cognitive Status: Within Functional Limits for tasks assessed                                       General Comments  Education on energy conservation strategies and copy of paper left in room. O2 required>86% O2 on 3L with exertion; increased  >90% with 1 min rest.    Exercises     Shoulder Instructions      Home Living Family/patient expects to be discharged to:: Private residence Living Arrangements: Alone Available Help at Discharge: Family;Available 24 hours/day Type of Home: House Home Access: Ramped entrance Entrance Stairs-Number of Steps: 2   Home Layout: One level     Bathroom Shower/Tub: Teacher, early years/pre: Handicapped height     Home Equipment: Tub bench;Rolling Walker (2 wheels);Rollator (4 wheels);Cane - single point;Grab bars - toilet;Grab bars - tub/shower (lift chair)   Additional Comments: family can be with him 24/7. Sleeps in lift chair 99% of time. Uses 2 wheel RW in house, cane out of house      Prior Functioning/Environment Prior Level of Function : Independent/Modified Independent;Driving             Mobility Comments: drives to visit his girlfriend. Uses AD only when he feels he has to ADLs Comments: dons compression stockings independently        OT Problem List: Decreased activity tolerance      OT Treatment/Interventions:      OT Goals(Current goals can be found in the care plan section) Acute Rehab OT Goals Patient Stated Goal: to go home OT Goal Formulation: All assessment and education complete, DC therapy Potential to Achieve Goals: Good  OT Frequency:      Co-evaluation              AM-PAC OT "6 Clicks" Daily Activity     Outcome Measure Help from another person eating meals?: None Help from another person taking care of personal grooming?: None Help from another person toileting, which includes using  toliet, bedpan, or urinal?: A Little Help from another person bathing (including washing, rinsing, drying)?: A Little Help from another person to put on and taking off regular upper body clothing?: None Help from another person to put on and taking off regular lower body clothing?: None 6 Click Score: 22   End of Session Equipment Utilized During Treatment: Rolling walker (2 wheels) Nurse Communication: Mobility status  Activity Tolerance: Patient tolerated treatment well Patient left: in chair;with call bell/phone within reach;with chair alarm set  OT Visit Diagnosis: Unsteadiness on feet (R26.81)                Time: 4034-7425 OT Time Calculation (min): 34 min Charges:  OT General  Charges $OT Visit: 1 Visit OT Evaluation $OT Eval Moderate Complexity: 1 Mod OT Treatments $Self Care/Home Management : 8-22 mins  Jefferey Pica, OTR/L Acute Rehabilitation Services Office: [phone]   [id number] 03/06/2022, 9:45 AM

## 2022-03-06 NOTE — Progress Notes (Signed)
Progress Note   Patient: Levi [name] WUJ:[id number] DOB: May 09, 1929 DOA: 03/04/2022     2 DOS: the patient was seen and examined on 03/06/2022   Brief hospital course: Mr. Tadros was admitted to the hospital with the working diagnosis of heart failure.   88 yo male with the past medical history of CAD sp CABG, heart failure, COPD, hypertension, atrial fibrillation, pace maker implantation, prostate cancer, and obesity class 3 who presented with dyspnea. Reported worsening dyspnea for 5 days, to the point patient where he was not able to speak in full sentences. Positive orthopnea, PND and lower extremity edema. On his initial physical examination he was in respiratory distress and was placed on Bipap, his blood pressure was 131/40, RR 30, HR 70 and 02 saturation 97%,  lungs with no wheezing or rhonchi, heart with S1 and S2 present and rhythmic, abdomen not distended, positive lower extremity edema.   Na 138, K 4,0 CL 100 bicarbonate 28 glucose 120 bun 26 cr 1,71 BNP 56  High sensitive troponin 25 and 26  Wbc 9,5 hgb 14.2 plt 228  Sars covid 19 negative   Chest radiograph with cardiomegaly,left lower lobe with increased lung marking, right upper lobe peripheral round opacity.   EKG 70 bpm, left axis deviation, left bundle branch block, qtc 520, ventricular paced rhythm, with no significant ST segment or T wave changes.   Patient has been placed on IV furosemide with good toleration.   Assessment and Plan: * Acute on chronic combined systolic and diastolic CHF (congestive heart failure) (HCC) Echocardiogram with LV systolic function preserved with EF 50 to 55%, apical segment and apex are akinetic. RV with mild enlargement,  preserved systolic function, RA moderate dilatation, severe tricuspid regurgitation   Urine output 1.905 ml  Blood pressure systolic 956 to 213 mmHg.   Continue diuresis with furosemide and spironolactone.  Continue with hydralazine for after load reduction.   Holding on further RAS inhibition due to decreased GFR.  Add SGLT2 inh today.   Acute hypoxemic respiratory failure.   02 saturation is 94% on 3 L/min per [place], at home patient is not on supplemental 02.  Uses Cpap at night.  Pneumonia has been ruled out.   Essential hypertension Improved blood pressure with hydralazine, continue diuresis with furosemide and spironolactone.  Not able to start on ARB or ARNI due to low GFR.    Chronic kidney disease, stage 3b ([place]) Patient with appropriate urine output and improving in volume status but not euvolemic yet.   Renal function today with serum cr at 1,74 K is 4,3 and serum bicarbonate at 29. Mg 2,7   Plan to continue diuresis with furosemide and spironolactone Follow up renal function in am.   PAF (paroxysmal atrial fibrillation) (HCC) Continue rate control with amiodarone and anticoagulation with apixaban. Continue telemetry monitoring.   H/O coronary artery bypass surgery No chest pain, no acute coronary syndrome.   HLD (hyperlipidemia) Continue with statin therapy.   OSA (obstructive sleep apnea) Continue with cpap.   Class 3 obesity (HCC) Calculated BMI is 41,6  Presence of permanent cardiac pacemaker Ventricular paced rhythm.   Hypothyroidism Continue with levothyroxine         Subjective: Patient is improving but not yet back to baseline, this am is out of bed. Lower extremity edema and dyspnea continue to improve.   Physical Exam: Vitals:   03/05/22 1949 03/06/22 0347 03/06/22 0831 03/06/22 1132  BP:  (!) 144/65 129/70 130/64  Pulse: 75 73  68 73  Resp: 16 18 16 16   Temp:    97.6 F (36.4 C)  TempSrc:    Oral  SpO2: 93% 95% 94% 96%  Weight:  125.3 kg    Height:       Neurology awake and alert ENT with no pallor Cardiovascular with S1 and S2 present and regular, with no gallops, or murmurs No JVD Positive lower extremity edema + to ++ Respiratory with faint expiratory wheezing, with no rhonchi.  Positive rales at the lower lobes Abdomen not distended.  Data Reviewed:    Family Communication: I spoke with patient's daughter at the bedside, we talked in detail about patient's condition, plan of care and prognosis and all questions were addressed.   Disposition: Status is: Inpatient Remains inpatient appropriate because: heart failure and volume overload.;   Planned Discharge Destination: Home      Author: Tawni Millers, MD 03/06/2022 11:39 AM  For on call review [email or web address].

## 2022-03-06 NOTE — Plan of Care (Signed)
  Problem: Education: Goal: Knowledge of General Education information will improve Description: Including pain rating scale, medication(s)/side effects and non-pharmacologic comfort measures Outcome: Progressing   Problem: Health Behavior/Discharge Planning: Goal: Ability to manage health-related needs will improve Outcome: Progressing   Problem: Clinical Measurements: Goal: Diagnostic test results will improve Outcome: Progressing   

## 2022-03-07 ENCOUNTER — Encounter (HOSPITAL_COMMUNITY): Payer: Self-pay | Admitting: Internal Medicine

## 2022-03-07 LAB — BASIC METABOLIC PANEL
Anion gap: 9 (ref 5–15)
BUN: 28 mg/dL — ABNORMAL HIGH (ref 8–23)
CO2: 27 mmol/L (ref 22–32)
Calcium: 8.6 mg/dL — ABNORMAL LOW (ref 8.9–10.3)
Chloride: 101 mmol/L (ref 98–111)
Creatinine, Ser: 1.77 mg/dL — ABNORMAL HIGH (ref 0.61–1.24)
GFR, Estimated: 36 mL/min — ABNORMAL LOW (ref >60)
Glucose, Bld: 94 mg/dL (ref 70–99)
Potassium: 3.8 mmol/L (ref 3.5–5.1)
Sodium: 137 mmol/L (ref 135–145)

## 2022-03-07 MED ORDER — FUROSEMIDE 10 MG/ML IJ SOLN
60.0000 mg | Freq: Two times a day (BID) | INTRAMUSCULAR | Status: DC
  Administered 2022-03-07 (×2): 60 mg via INTRAVENOUS
  Filled 2022-03-07 (×2): qty 6

## 2022-03-07 MED ORDER — POTASSIUM CHLORIDE CRYS ER 20 MEQ PO TBCR
40.0000 meq | EXTENDED_RELEASE_TABLET | Freq: Once | ORAL | Status: AC
Start: 2022-03-07 — End: 2022-03-07
  Administered 2022-03-07: 40 meq via ORAL
  Filled 2022-03-07: qty 2

## 2022-03-07 NOTE — Plan of Care (Signed)
  Problem: Clinical Measurements: Goal: Cardiovascular complication will be avoided Outcome: Progressing   

## 2022-03-07 NOTE — Plan of Care (Signed)
  Problem: Education: Goal: Knowledge of General Education information will improve Description: Including pain rating scale, medication(s)/side effects and non-pharmacologic comfort measures Outcome: Progressing   Problem: Clinical Measurements: Goal: Respiratory complications will improve Outcome: Progressing   

## 2022-03-07 NOTE — Progress Notes (Signed)
PROGRESS NOTE  Levi [name]  XFG:[id number] DOB: Nov 06, 1928 DOA: 03/04/2022 PCP: Bernerd Limbo, MD   Brief Narrative:  Patient is a 88 year old male with history of combined systolic/diastolic congestive  heart failure, coronary disease status post CABG, COPD, hypertension, paroxysmal A-fib, status post pacemaker implantation, pressure cancer, obesity who presented from home with dyspnea, orthopnea, bilateral lower extremity edema.  On presentation he was in respiratory status and had to be put on BiPAP.  Chest x-ray showed cardiomegaly.  Patient was found to be volume overloaded and was started on IV diuretics.  He qualified for home oxygen.  Possible discharge home tomorrow if improvement in the volume status.  Assessment & Plan:  Principal Problem:   Acute on chronic combined systolic and diastolic CHF (congestive heart failure) (HCC) Active Problems:   Essential hypertension   Chronic kidney disease, stage 3b (HCC)   PAF (paroxysmal atrial fibrillation) (HCC)   H/O coronary artery bypass surgery   HLD (hyperlipidemia)   Class 3 obesity (HCC)   OSA (obstructive sleep apnea)   Presence of permanent cardiac pacemaker   Hypothyroidism  Acute on chronic combined systolic/diastolic congestive heart failure: Echocardiogram has shown preserved systolic function with EF of 50 to 55%, mild enlargement of the right ventricle, normal left ventricular diastolic parameters.  Previous echo on 03/2020 showed EF of 45 to 96%, grade 2 diastolic dysfunction.   BNP was normal.  But clinically patient appeared volume overloaded.  He has bilateral basilar crackles, lower extremity edema on presentation. Started on Lasix IV.  Will increase the Lasix to 60 mg IV twice daily for now.  We will check volume status tomorrow.  Also on aspirin lactone.  Started on SGLT2 inhibitor here. Continue monitor input/output, daily weight.  Monitor BMP. He follows with Dr Harrington Challenger as an outpatient  Acute hypoxic  respiratory failure: Multifactorial secondary to CHF exacerbation.  OSA and probably OHS also playing a role here.  He does not use oxygen at home.  He qualified for home oxygen for 3 L/min.  He desaturates during sleep.  Maintaining saturation on room air while awake.  Hypertension: Continue hydralazine.  Monitor blood pressure.  Currently blood pressure stable  CKD stage IIIb: Baseline creatinine is from 1.4-1.7.  Currently significant at baseline.  Monitor BMP.  Paroxysmal A-fib: Currently in paced rhythm.  Maintaining rate control .  Continue amiodarone, Eliquis for anticoagulation.  Status post pacemaker  History of coronary artery disease: Status post bypass surgery.  No anginal symptoms.  History of COPD: Currently not in exacerbation.  Hyperlipidemia: Continue statin  Hypothyroidism: Continue Synthyroid  OSA: Continue CPAP  Morbid obesity: BMI of  40.8        DVT prophylaxis:apixaban (ELIQUIS) tablet 2.5 mg Start: 03/04/22 2200 apixaban (ELIQUIS) tablet 2.5 mg     Code Status: Full Code  Family Communication: Called son [name] on phone, call not received.:  Discussed with daughter Shauna Hugh on phone  Patient status:Inpatient  Patient is from :Home  Anticipated discharge VE:LFYB  Estimated DC date:1-2 days   Consultants: None  Procedures:None  Antimicrobials:  Anti-infectives (From admission, onward)    None       Subjective:  Patient seen and examined at the bedside this morning.  Overall he looks comfortable.  Hemodynamically stable.  He maintains his saturation on room air while awake but as per the RN, he desaturates during sleep.  He was not in respiratory distress during my evaluation.  He still has trace bilateral lower extremity edema.  He has  bilateral basal crackles on auscultation.  Eager to go home.  We discussed about continue with IV Lasix for today with possibility of discharging home tomorrow.  Objective: Vitals:   03/07/22 0027 03/07/22 0336  03/07/22 0741 03/07/22 0758  BP: (!) 141/60 124/62  120/67  Pulse: 73 79  70  Resp: 20 20  20   Temp: 98 F (36.7 C) 97.6 F (36.4 C)  97.7 F (36.5 C)  TempSrc: Oral Oral  Oral  SpO2: 95% 91% 97% 94%  Weight:  125.5 kg    Height:        Intake/Output Summary (Last 24 hours) at 03/07/2022 1125 Last data filed at 03/07/2022 8295 Gross per 24 hour  Intake 956 ml  Output 2500 ml  Net -1544 ml   Filed Weights   03/05/22 0005 03/06/22 0347 03/07/22 0336  Weight: 127.8 kg 125.3 kg 125.5 kg    Examination:  General exam: Overall comfortable, not in distress, Pleasant elderly male, morbidly obese  HEENT: PERRL Respiratory system: Bilateral basal crackles Cardiovascular system: S1 & S2 heard, RRR.  Gastrointestinal system: Abdomen is nondistended, soft and nontender. Central nervous system: Alert and oriented Extremities: Trace bilateral lower extremity edema, no clubbing ,no cyanosis Skin: No rashes, no ulcers,no icterus     Data Reviewed: I have personally reviewed following labs and imaging studies  CBC: Recent Labs  Lab 03/04/22 1157 03/04/22 1211 03/04/22 1255 03/05/22 0153  WBC 9.5  --   --  8.2  NEUTROABS 7.2  --   --   --   HGB 14.2 15.0 14.6 13.8  HCT 43.5 44.0 43.0 43.6  MCV 98.4  --   --  97.8  PLT 228  --   --  621   Basic Metabolic Panel: Recent Labs  Lab 03/04/22 1157 03/04/22 1211 03/04/22 1255 03/05/22 0153 03/06/22 0216 03/07/22 0326  NA 138 138 137 141 139 137  K 4.0 3.9 4.0 3.9 4.3 3.8  CL 100 100  --  99 99 101  CO2 28  --   --  29 29 27   GLUCOSE 120* 121*  --  107* 93 94  BUN 26* 28*  --  28* 28* 28*  CREATININE 1.71* 1.80*  --  1.77* 1.74* 1.77*  CALCIUM 8.9  --   --  8.7* 8.9 8.6*  MG  --   --   --   --  2.7*  --      Recent Results (from the past 240 hour(s))  Resp Panel by RT-PCR (Flu A&B, Covid) Anterior Nasal Swab     Status: None   Collection Time: 03/04/22  3:02 PM   Specimen: Anterior Nasal Swab  Result Value Ref Range  Status   SARS Coronavirus 2 by RT PCR NEGATIVE NEGATIVE Final    Comment: (NOTE) SARS-CoV-2 target nucleic acids are NOT DETECTED.  The SARS-CoV-2 RNA is generally detectable in upper respiratory specimens during the acute phase of infection. The lowest concentration of SARS-CoV-2 viral copies this assay can detect is 138 copies/mL. A negative result does not preclude SARS-Cov-2 infection and should not be used as the sole basis for treatment or other patient management decisions. A negative result may occur with  improper specimen collection/handling, submission of specimen other than nasopharyngeal swab, presence of viral mutation(s) within the areas targeted by this assay, and inadequate number of viral copies(<138 copies/mL). A negative result must be combined with clinical observations, patient history, and epidemiological information. The expected result is Negative.  Fact  Sheet for Patients:  EntrepreneurPulse.com.au  Fact Sheet for Healthcare Providers:  IncredibleEmployment.be  This test is no t yet approved or cleared by the [place] FDA and  has been authorized for detection and/or diagnosis of SARS-CoV-2 by FDA under an Emergency Use Authorization (EUA). This EUA will remain  in effect (meaning this test can be used) for the duration of the COVID-19 declaration under Section 564(b)(1) of the Act, 21 U.S.C.section 360bbb-3(b)(1), unless the authorization is terminated  or revoked sooner.       Influenza A by PCR NEGATIVE NEGATIVE Final   Influenza B by PCR NEGATIVE NEGATIVE Final    Comment: (NOTE) The Xpert Xpress SARS-CoV-2/FLU/RSV plus assay is intended as an aid in the diagnosis of influenza from Nasopharyngeal swab specimens and should not be used as a sole basis for treatment. Nasal washings and aspirates are unacceptable for Xpert Xpress SARS-CoV-2/FLU/RSV testing.  Fact Sheet for  Patients: EntrepreneurPulse.com.au  Fact Sheet for Healthcare Providers: IncredibleEmployment.be  This test is not yet approved or cleared by the [place] FDA and has been authorized for detection and/or diagnosis of SARS-CoV-2 by FDA under an Emergency Use Authorization (EUA). This EUA will remain in effect (meaning this test can be used) for the duration of the COVID-19 declaration under Section 564(b)(1) of the Act, 21 U.S.C. section 360bbb-3(b)(1), unless the authorization is terminated or revoked.  Performed at [facility], [place] [address], [place], [place] [address]      Radiology Studies: ECHOCARDIOGRAM COMPLETE  Result Date: 03/05/2022    ECHOCARDIOGRAM REPORT   Patient Name:   XYON LUKASIK Date of Exam: 03/05/2022 Medical Rec #:  [id number]     Height:       69.0 in Accession #:    [id number]    Weight:       281.7 lb Date of Birth:  1929/02/08     BSA:          2.390 m Patient Age:    5 years      BP:           132/68 mmHg Patient Gender: M             HR:           75 bpm. Exam Location:  Inpatient Procedure: 2D Echo, Cardiac Doppler, Color Doppler and Intracardiac            Opacification Agent Indications:    CHF  History:        Patient has prior history of Echocardiogram examinations, most                 recent 03/15/2020. CAD, Prior CABG, Signs/Symptoms:Shortness of                 Breath; Risk Factors:Hypertension and Dyslipidemia.  Sonographer:    Merrie Roof RDCS Referring Phys: Karmen Bongo  Sonographer Comments: No subcostal window. IMPRESSIONS  1. Left ventricular ejection fraction, by estimation, is 50 to 55%. The left ventricle has low normal function. The left ventricle has no regional wall motion abnormalities. Left ventricular diastolic parameters were normal.  2. Right ventricular systolic function is normal. The right ventricular size is mildly enlarged.  3. Right atrial size was moderately dilated.  4. The mitral valve  is normal in structure. Trivial mitral valve regurgitation. No evidence of mitral stenosis.  5. Tricuspid valve regurgitation is severe.  6. The aortic valve is normal in structure. Aortic valve regurgitation is not visualized. No  aortic stenosis is present.  7. The inferior vena cava is normal in size with greater than 50% respiratory variability, suggesting right atrial pressure of 3 mmHg. FINDINGS  Left Ventricle: Left ventricular ejection fraction, by estimation, is 50 to 55%. The left ventricle has low normal function. The left ventricle has no regional wall motion abnormalities. Definity contrast agent was given IV to delineate the left ventricular endocardial borders. The left ventricular internal cavity size was normal in size. There is no left ventricular hypertrophy. Abnormal (paradoxical) septal motion, consistent with left bundle branch block. Left ventricular diastolic parameters  were normal.  LV Wall Scoring: The apical septal segment and apex are akinetic. Right Ventricle: The right ventricular size is mildly enlarged. No increase in right ventricular wall thickness. Right ventricular systolic function is normal. Left Atrium: Left atrial size was normal in size. Right Atrium: Right atrial size was moderately dilated. Pericardium: There is no evidence of pericardial effusion. Mitral Valve: The mitral valve is normal in structure. Mild mitral annular calcification. Trivial mitral valve regurgitation. No evidence of mitral valve stenosis. Tricuspid Valve: The tricuspid valve is normal in structure. Tricuspid valve regurgitation is severe. No evidence of tricuspid stenosis. Aortic Valve: The aortic valve is normal in structure. Aortic valve regurgitation is not visualized. No aortic stenosis is present. Aortic valve mean gradient measures 4.0 mmHg. Aortic valve peak gradient measures 7.1 mmHg. Aortic valve area, by VTI measures 1.83 cm. Pulmonic Valve: The pulmonic valve was normal in structure.  Pulmonic valve regurgitation is not visualized. No evidence of pulmonic stenosis. Aorta: The aortic root is normal in size and structure. Venous: The inferior vena cava is normal in size with greater than 50% respiratory variability, suggesting right atrial pressure of 3 mmHg. IAS/Shunts: There is redundancy of the interatrial septum. No atrial level shunt detected by color flow Doppler.  LEFT VENTRICLE PLAX 2D LVIDd:         5.40 cm   Diastology LVIDs:         3.90 cm   LV e' medial:    7.18 cm/s LV PW:         0.90 cm   LV E/e' medial:  14.6 LV IVS:        1.30 cm   LV e' lateral:   11.40 cm/s LVOT diam:     2.10 cm   LV E/e' lateral: 9.2 LV SV:         46 LV SV Index:   19 LVOT Area:     3.46 cm  RIGHT VENTRICLE RV Basal diam:  5.20 cm RV Mid diam:    5.30 cm RV S prime:     8.92 cm/s TAPSE (M-mode): 1.4 cm LEFT ATRIUM           Index        RIGHT ATRIUM           Index LA diam:      5.20 cm 2.18 cm/m   RA Area:     37.50 cm LA Vol (A4C): 71.5 ml 29.92 ml/m  RA Volume:   144.00 ml 60.25 ml/m  AORTIC VALVE AV Area (Vmax):    1.80 cm AV Area (Vmean):   1.67 cm AV Area (VTI):     1.83 cm AV Vmax:           133.00 cm/s AV Vmean:          91.100 cm/s AV VTI:  0.254 m AV Peak Grad:      7.1 mmHg AV Mean Grad:      4.0 mmHg LVOT Vmax:         69.10 cm/s LVOT Vmean:        43.900 cm/s LVOT VTI:          0.134 m LVOT/AV VTI ratio: 0.53  AORTA Ao Root diam: 3.10 cm MITRAL VALVE                TRICUSPID VALVE MV Area (PHT): 4.89 cm     TR Peak grad:   27.5 mmHg MV Decel Time: 155 msec     TR Vmax:        262.00 cm/s MV E velocity: 105.00 cm/s MV A velocity: 62.20 cm/s   SHUNTS MV E/A ratio:  1.69         Systemic VTI:  0.13 m                             Systemic Diam: 2.10 cm Candee Furbish MD Electronically signed by Candee Furbish MD Signature Date/Time: 03/05/2022/2:14:14 PM    Final     Scheduled Meds:  amiodarone  100 mg Oral Daily   apixaban  2.5 mg Oral BID   docusate sodium  100 mg Oral BID    empagliflozin  10 mg Oral Daily   furosemide  60 mg Intravenous Q12H   hydrALAZINE  50 mg Oral q12n4p   levothyroxine  112 mcg Oral QAC breakfast   potassium chloride  40 mEq Oral Once   pravastatin  20 mg Oral Daily   sodium chloride flush  3 mL Intravenous Q12H   spironolactone  12.5 mg Oral Daily   tamsulosin  0.4 mg Oral QHS   Continuous Infusions:   LOS: 3 days   Shelly Coss, MD [facility] P7/01/2022, 11:25 AM

## 2022-03-07 NOTE — Progress Notes (Signed)
Pt is resting well on his home CPAP.

## 2022-03-07 NOTE — Progress Notes (Signed)
Heart Failure Stewardship Pharmacist Progress Note   PCP: Bernerd Limbo, MD PCP-Cardiologist: [name], MD    HPI:  88 yo M with PMH of CAD s/p CABG, CHF, COPD, HTN, afib, PPM, prostate cancer, and obesity. He presented to the ED on 7/3 with respiratory distress, edema, and orthopnea. CXR with new nodular peripheral airspace opacity in the right mid lung may represent infection in the appropriate clinical context versus new pulmonary nodule. Admitted for acute CHF. ECHO on 7/4 with LVEF 50-55%, RV normal. Prior ECHO in 03/2020 with LVEF 45-50%, 09/2018 with LVEF 40-45%, 06/2016 with LVEF 50-55%.   Current HF Medications: Diuretic: furosemide 60 mg IV BID MRA: spironolactone 12.5 mg daily SGLT2i: Jardiance 10 mg daily Other: hydralazine 50 mg BID  Prior to admission HF Medications: Diuretic: torsemide 60 mg BID Aldosterone Antagonist: spironolactone 12.5 mg daily SGLT2i: Jardiance 10 mg daily  Pertinent Lab Values: Serum creatinine 1.77, BUN 28, Potassium 3.8, Sodium 137, BNP 56.3, Magnesium 2.7   Vital Signs: Weight: 276 lbs (admission weight: 281 lbs) Blood pressure: 120-140/60s  Heart rate: 70s - V paced  I/O: -2L yesterday; net -4.4L  Medication Assistance / Insurance Benefits Check: Does the patient have prescription insurance?  Yes Type of insurance plan: AARP Medicare  Does the patient qualify for medication assistance through manufacturers or grants?   Yes Eligible grants and/or patient assistance programs: Ferne Coe, Eliquis Medication assistance applications in progress: none  Medication assistance applications approved: Jardiance, Eliquis Approved medication assistance renewals will be completed by: [place]:  Prior to admission outpatient pharmacy: [facility] Is the patient willing to use [facility] at discharge? Yes Is the patient willing to transition their outpatient pharmacy to utilize a [facility] outpatient pharmacy?    Pending    Assessment: 1. Acute on chronic systolic and diastolic CHF (LVEF 62-95%). NYHA class III symptoms. - Agree with increasing to furosemide 60 mg IV BID - output good, weight unchanged from yesterday. Needs K replacement. Keep K>4 and Mag>2. - Continue spironolactone 12.5 mg daily - Continue hydralazine 50 mg BID for afterload reduction - Continue Jardiance 10 mg daily   Plan: 1) Medication changes recommended at this time: - KCl 40 mEq x 1 - Check magnesium in AM  2) Patient assistance: - Receives Jardiance for $0 from [place] for $0 from [facility]  3)  Education  - To be completed prior to discharge  Kerby Nora, PharmD, BCPS Heart Failure Cytogeneticist Phone [phone]

## 2022-03-07 NOTE — Progress Notes (Signed)
Heart Failure Nurse Navigator Progress Note  PCP: Bernerd Limbo, MD PCP-Cardiologist: Harrington Challenger Admission Diagnosis: Respiratory distress. Admitted from: Home via EMS  Presentation:   Levi [name] presented with respiratory distress, shortness  of breath worsening for the last month. Had labored breathing with EMS, was placed on CPAP. Patient stated he has been compliant with all his medications. BP 152/84, HR 70, 1+ bilateral lower extremity edema. Troponin 25, Given Lasix 40 mg IV, admitted.  Patient and daughter were educated on the sign and symptoms of heart failure. Daily weights (patient stated he wasn't weighing daily. When to call his doctor or go to the ER. Diet and fluid restrictions ( patient stated his girlfriend does a lot of the cooking and he doesn't think she uses a lot of salt, he does drink a lot of water that he will have to watch how much. Education was done on the importance of taking all his medication as prescribe and attending all his medical appointments. Patient and daughter voiced their understanding and is scheduled for a hospital follow up with HF TOC 03/18/22 @ 11 am.   ECHO/ LVEF: 50-55%  Clinical Course:  Past Medical History:  Diagnosis Date   Asthma    Benign neoplasm of colon    CAD (coronary artery disease) Dec 2011   s/p CABG per Dr. Roxy Manns; had normal EF; All SVGs occluded per follow up cath with only LIMA to LAD patent; s/p PCI of the proximal and mid LAD February 2014 per Dr. Burt Knack   Chronic combined systolic and diastolic CHF (congestive heart failure) ([place])    CKD (chronic kidney disease), stage II    Emphysema    "never treated for it" (10/05/2015)   History of hiatal hernia    HLD (hyperlipidemia)    HTN (hypertension)    Malaise and fatigue    Obesity    Osteopenia    PAF (paroxysmal atrial fibrillation) (HCC)    Pneumonia 1960s X 1; 1990s X 1   Presence of permanent cardiac pacemaker    Prostate cancer ([place])    Skin cancer    "face,  nose, top of ears, scalp"   Symptomatic bradycardia    STJ PPM, 10/05/15, Dr. Lovena Le     Social History   Socioeconomic History   Marital status: Widowed    Spouse name: Not on file   Number of children: 4   Years of education: Not on file   Highest education level: 9th grade  Occupational History   Occupation: retired Information systems manager: RETIRED  Tobacco Use   Smoking status: Former    Packs/day: 1.00    Years: 30.00    Total pack years: 30.00    Types: Cigarettes    Quit date: 09/07/1975    Years since quitting: 46.5   Smokeless tobacco: Former    Types: Chew    Quit date: 07/27/2015  Vaping Use   Vaping Use: Never used  Substance and Sexual Activity   Alcohol use: No    Alcohol/week: 0.0 standard drinks of alcohol   Drug use: No   Sexual activity: Not Currently  Other Topics Concern   Not on file  Social History Narrative   Not on file   Social Determinants of Health   Financial Resource Strain: Low Risk  (03/07/2022)   Overall Financial Resource Strain (CARDIA)    Difficulty of Paying Living Expenses: Not hard at all  Food Insecurity: No Food Insecurity (03/07/2022)   Hunger Vital Sign  Worried About Charity fundraiser in the Last Year: Never true    [place] in the Last Year: Never true  Transportation Needs: No Transportation Needs (03/07/2022)   PRAPARE - Hydrologist (Medical): No    Lack of Transportation (Non-Medical): No  Physical Activity: Not on file  Stress: Not on file  Social Connections: Not on file   Education Assessment and Provision:  Detailed education and instructions provided on heart failure disease management including the following:  Signs and symptoms of Heart Failure When to call the physician Importance of daily weights Low sodium diet Fluid restriction Medication management Anticipated future follow-up appointments  Patient education given on each of the above topics.  Patient acknowledges  understanding via teach back method and acceptance of all instructions.  Education Materials:  "Living Better With Heart Failure" Booklet, HF zone tool, & Daily Weight Tracker Tool.  Patient has scale at home: yes Patient has pill box at home: NA    High Risk Criteria for Readmission and/or Poor Patient Outcomes: Heart failure hospital admissions (last 6 months): 0  No Show rate: 3 Difficult social situation: No Demonstrates medication adherence: Yes Primary Language: English Literacy level: Reading, writing, and comprehension.   Barriers of Care:   Diet/ fluids  Daily weights  Considerations/Referrals:   Referral made to Heart Failure Pharmacist Stewardship: yes Referral made to Heart Failure CSW/NCM TOC: No Referral made to Heart & Vascular TOC clinic: Yes, 03/18/22 @ 11 am  Items for Follow-up on DC/TOC: Diet/ fluids Daily weights   Earnestine Leys, BSN, RN Heart Failure Transport planner Only

## 2022-03-07 NOTE — Progress Notes (Signed)
Physical Therapy Treatment Patient Details Name: Levi [name] MRN: [id number] DOB: Sep 15, 1928 Today's Date: 03/07/2022   History of Present Illness Pt is 88 yo male who presents to [facility] on 03/04/22 from home alone with respiratory distress. PMH: CAD s/p CABG, CHF, COPD, HTN, HLD, morbid obesity, afib, pacer, prostate cancer.    PT Comments    Pt tolerates therapy well today. Pt ambulates similar distance as he did yesterday with mobility specialist, but uses 2L O2 rather than 3L. Pt notes that today's mobilization felt more difficult than yesterday (more fatigue/SOB), however he is encouraged that his O2 sats were maintained in the normative range throughout the session. Continued therapy will assist the Pt in improving his activity tolerance so that he can progress towards his baseline level of independence.   Recommendations for follow up therapy are one component of a multi-disciplinary discharge planning process, led by the attending physician.  Recommendations may be updated based on patient status, additional functional criteria and insurance authorization.  Follow Up Recommendations  Home health PT     Assistance Recommended at Discharge Intermittent Supervision/Assistance  Patient can return home with the following Assistance with cooking/housework;Help with stairs or ramp for entrance;Assist for transportation   Equipment Recommendations  None recommended by PT    Recommendations for Other Services       Precautions / Restrictions Precautions Precautions: Other (comment) Precaution Comments: watch O2 sats Restrictions Weight Bearing Restrictions: No     Mobility  Bed Mobility Overal bed mobility: Modified Independent Bed Mobility: Supine to Sit                Transfers Overall transfer level: Needs assistance Equipment used: Rolling walker (2 wheels) Transfers: Sit to/from Stand Sit to Stand: Supervision           General transfer comment: Pt rocks  for momentum during sit to stand transfer; takes him a few trials to complete    Ambulation/Gait Ambulation/Gait assistance: Supervision Gait Distance (Feet): 200 Feet (intermittent standing rest breaks) Assistive device: Rolling walker (2 wheels) Gait Pattern/deviations: Step-through pattern, Wide base of support, Decreased stride length Gait velocity: decreased Gait velocity interpretation: 1.31 - 2.62 ft/sec, indicative of limited community ambulator   General Gait Details: Pt ambulates on 2L O2 with O2 saturations maintained >89%. Reports that ambulation today feels more difficult than yesterday with mobility specialist (3L O2 used).   Stairs             Wheelchair Mobility    Modified Rankin (Stroke Patients Only)       Balance Overall balance assessment: Needs assistance Sitting-balance support: No upper extremity supported, Feet supported Sitting balance-Leahy Scale: Good     Standing balance support: Bilateral upper extremity supported, During functional activity, Reliant on assistive device for balance Standing balance-Leahy Scale: Poor                              Cognition Arousal/Alertness: Awake/alert Behavior During Therapy: WFL for tasks assessed/performed Overall Cognitive Status: Within Functional Limits for tasks assessed                                          Exercises      General Comments General comments (skin integrity, edema, etc.): VSS on 2L O2      Pertinent Vitals/Pain Pain Assessment Pain Assessment: No/denies  pain    Home Living                          Prior Function            PT Goals (current goals can now be found in the care plan section) Acute Rehab PT Goals Patient Stated Goal: return home, breathe better PT Goal Formulation: With patient Time For Goal Achievement: 03/19/22 Potential to Achieve Goals: Good Progress towards PT goals: Progressing toward goals     Frequency    Min 3X/week      PT Plan Current plan remains appropriate    Co-evaluation              AM-PAC PT "6 Clicks" Mobility   Outcome Measure  Help needed turning from your back to your side while in a flat bed without using bedrails?: None Help needed moving from lying on your back to sitting on the side of a flat bed without using bedrails?: None Help needed moving to and from a bed to a chair (including a wheelchair)?: A Little Help needed standing up from a chair using your arms (e.g., wheelchair or bedside chair)?: A Little Help needed to walk in hospital room?: A Little Help needed climbing 3-5 steps with a railing? : A Lot 6 Click Score: 19    End of Session Equipment Utilized During Treatment: Gait belt;Oxygen Activity Tolerance: Patient limited by fatigue Patient left: in bed;with call bell/phone within reach;with nursing/sitter in room;with family/visitor present Nurse Communication: Mobility status PT Visit Diagnosis: Unsteadiness on feet (R26.81);Difficulty in walking, not elsewhere classified (R26.2)     Time: 9169-4503 PT Time Calculation (min) (ACUTE ONLY): 18 min  Charges:                        Hall Busing, SPT Acute Rehabilitation Office #: [phone]    Hall Busing 03/07/2022, 1:03 PM

## 2022-03-07 NOTE — Telephone Encounter (Signed)
Received fax from [facility] stating that pt has been approved and is now eligible to receive Eliquis free of charge from 03/06/22 through 09/01/22.   Application Case #: [id number]

## 2022-03-07 NOTE — TOC Progression Note (Signed)
Transition of Care [facility]) - Progression Note    Patient Details  Name: Levi [name] MRN: [id number] Date of Birth: [birth year]  Transition of Care [facility]) CM/SW Contact  Zenon Mayo, RN Phone Number: 03/07/2022, 1:41 PM  Clinical Narrative:    Patient will need home oxygen, NCM asked patient and daughter who is at the bedside, if they have a preference they are ok with [employer] supplying this for them.  NCM made referral to [facility] with [facility].     Expected Discharge Plan: [place] Barriers to Discharge: Continued Medical Work up  Expected Discharge Plan and Services Expected Discharge Plan: [place]   Discharge Planning Services: CM Consult Post Acute Care Choice: [place] arrangements for the past 2 months: Single Family Home                           HH Arranged: RN, PT, OT [facility] Agency: [place] Date Bee: 03/05/22   Representative spoke with at [place]: [place] (SDOH) Interventions Food Insecurity Interventions: Intervention Not Indicated Financial Strain Interventions: Intervention Not Indicated Housing Interventions: Intervention Not Indicated Transportation Interventions: Intervention Not Indicated  Readmission Risk Interventions     No data to display

## 2022-03-08 ENCOUNTER — Other Ambulatory Visit (HOSPITAL_COMMUNITY): Payer: Self-pay

## 2022-03-08 LAB — BASIC METABOLIC PANEL
Anion gap: 8 (ref 5–15)
BUN: 32 mg/dL — ABNORMAL HIGH (ref 8–23)
CO2: 26 mmol/L (ref 22–32)
Calcium: 8.5 mg/dL — ABNORMAL LOW (ref 8.9–10.3)
Chloride: 104 mmol/L (ref 98–111)
Creatinine, Ser: 1.63 mg/dL — ABNORMAL HIGH (ref 0.61–1.24)
GFR, Estimated: 40 mL/min — ABNORMAL LOW (ref >60)
Glucose, Bld: 100 mg/dL — ABNORMAL HIGH (ref 70–99)
Potassium: 3.9 mmol/L (ref 3.5–5.1)
Sodium: 138 mmol/L (ref 135–145)

## 2022-03-08 LAB — MAGNESIUM: Magnesium: 2.7 mg/dL — ABNORMAL HIGH (ref 1.7–2.4)

## 2022-03-08 MED ORDER — FUROSEMIDE 10 MG/ML IJ SOLN
60.0000 mg | Freq: Two times a day (BID) | INTRAMUSCULAR | Status: DC
  Administered 2022-03-08 (×2): 60 mg via INTRAVENOUS
  Filled 2022-03-08 (×2): qty 6

## 2022-03-08 MED ORDER — POTASSIUM CHLORIDE CRYS ER 20 MEQ PO TBCR
40.0000 meq | EXTENDED_RELEASE_TABLET | Freq: Once | ORAL | Status: AC
Start: 2022-03-08 — End: 2022-03-08
  Administered 2022-03-08: 40 meq via ORAL
  Filled 2022-03-08: qty 2

## 2022-03-08 MED ORDER — FLUTICASONE PROPIONATE 50 MCG/ACT NA SUSP
1.0000 | Freq: Every day | NASAL | Status: DC
  Administered 2022-03-08 – 2022-03-09 (×2): 1 via NASAL
  Filled 2022-03-08: qty 16

## 2022-03-08 MED ORDER — EMPAGLIFLOZIN 10 MG PO TABS
10.0000 mg | ORAL_TABLET | Freq: Every day | ORAL | 1 refills | Status: AC
  Filled 2022-03-08 (×2): qty 30, 30d supply, fill #0

## 2022-03-08 NOTE — Progress Notes (Signed)
Heart Failure Stewardship Pharmacist Progress Note   PCP: Bernerd Limbo, MD PCP-Cardiologist: [name], MD    HPI:  88 yo M with PMH of CAD s/p CABG, CHF, COPD, HTN, afib, PPM, prostate cancer, and obesity. He presented to the ED on 7/3 with respiratory distress, edema, and orthopnea. CXR with new nodular peripheral airspace opacity in the right mid lung may represent infection in the appropriate clinical context versus new pulmonary nodule. Admitted for acute CHF. ECHO on 7/4 with LVEF 50-55%, RV normal. Prior ECHO in 03/2020 with LVEF 45-50%, 09/2018 with LVEF 40-45%, 06/2016 with LVEF 50-55%.   Current HF Medications: Diuretic: furosemide 60 mg IV BID MRA: spironolactone 12.5 mg daily SGLT2i: Jardiance 10 mg daily Other: hydralazine 50 mg BID  Prior to admission HF Medications: Diuretic: torsemide 60 mg BID Aldosterone Antagonist: spironolactone 12.5 mg daily SGLT2i: Jardiance 10 mg daily  Pertinent Lab Values: Serum creatinine 1.63, BUN 32, Potassium 3.9, Sodium 138, BNP 56.3, Magnesium 2.7   Vital Signs: Weight: 273 lbs (admission weight: 281 lbs) Blood pressure: 120-140/60s  Heart rate: 70s - V paced  I/O: -2.8L yesterday; net -9.7L  Medication Assistance / Insurance Benefits Check: Does the patient have prescription insurance?  Yes Type of insurance plan: AARP Medicare  Does the patient qualify for medication assistance through manufacturers or grants?   Yes Eligible grants and/or patient assistance programs: Ferne Coe, Eliquis Medication assistance applications in progress: none  Medication assistance applications approved: Jardiance, Eliquis Approved medication assistance renewals will be completed by: [place]:  Prior to admission outpatient pharmacy: [facility] Is the patient willing to use [facility] at discharge? Yes Is the patient willing to transition their outpatient pharmacy to utilize a [facility] outpatient pharmacy?    Pending    Assessment: 1. Acute on chronic systolic and diastolic CHF (LVEF 57-32%). NYHA class III symptoms. - Fluid overloaded - continue furosemide 60 mg IV BID - output better with increased dose, weight down 3 lbs from yesterday. Needs K replacement. Keep K>4 and Mag>2. - Continue spironolactone 12.5 mg daily - Continue hydralazine 50 mg BID for afterload reduction - Continue Jardiance 10 mg daily   Plan: 1) Medication changes recommended at this time: - KCl 40 mEq x 1  2) Patient assistance: - Marketing executive for $0 from [name] for $0 from [employer]  3)  Education  - Patient has been educated on current HF medications and potential additions to HF medication regimen - Patient verbalizes understanding that over the next few months, these medication doses may change and more medications may be added to optimize HF regimen - Patient has been educated on basic disease state pathophysiology and goals of therapy   Kerby Nora, PharmD, BCPS Heart Failure Cytogeneticist Phone [phone]

## 2022-03-08 NOTE — Progress Notes (Signed)
PT placed on cpap

## 2022-03-08 NOTE — Care Management Important Message (Signed)
Important Message  Patient Details  Name: Levi [name] MRN: [id number] Date of Birth: Aug 13, 1929   Medicare Important Message Given:  Yes     Shelda Altes 03/08/2022, 9:39 AM

## 2022-03-08 NOTE — Progress Notes (Signed)
Pt self administers home CPAP.

## 2022-03-08 NOTE — Progress Notes (Signed)
PROGRESS NOTE  Levi [name]  KVQ:[id number] DOB: [birth year] DOA: 03/04/2022 PCP: Bernerd Limbo, MD   Brief Narrative:  Patient is a 88 year old male with history of combined systolic/diastolic congestive  heart failure, coronary disease status post CABG, COPD, hypertension, paroxysmal A-fib, status post pacemaker implantation, pressure cancer, obesity who presented from home with dyspnea, orthopnea, bilateral lower extremity edema.  On presentation he was in respiratory status and had to be put on BiPAP.  Chest x-ray showed cardiomegaly.  Patient was found to be volume overloaded and was started on IV diuretics.  He qualified for home oxygen.  Possible discharge home tomorrow if improvement in the volume status.  Assessment & Plan:  Principal Problem:   Acute on chronic combined systolic and diastolic CHF (congestive heart failure) (HCC) Active Problems:   Essential hypertension   Chronic kidney disease, stage 3b (HCC)   PAF (paroxysmal atrial fibrillation) (HCC)   H/O coronary artery bypass surgery   HLD (hyperlipidemia)   Class 3 obesity (HCC)   OSA (obstructive sleep apnea)   Presence of permanent cardiac pacemaker   Hypothyroidism  Acute on chronic combined systolic/diastolic congestive heart failure: Echocardiogram has shown preserved systolic function with EF of 50 to 55%, mild enlargement of the right ventricle, normal left ventricular diastolic parameters.  Previous echo on 03/2020 showed EF of 45 to 64%, grade 2 diastolic dysfunction.   BNP was normal.  But clinically patient appeared volume overloaded.  He has bilateral basilar crackles, lower extremity edema on presentation. Started on Lasix IV.  We increased  the Lasix to 60 mg IV twice daily   We will check volume status tomorrow.  Also on spironolactone .  Started on SGLT2 inhibitor here. Continue monitor input/output, daily weight.  Monitor BMP. He follows with Dr Harrington Challenger as an outpatient  Acute hypoxic respiratory  failure: Multifactorial secondary to CHF exacerbation.  OSA and probably OHS also playing a role here.  He does not use oxygen at home.  He qualified for home oxygen for 3 L/min.  He desaturates during sleep.  Maintaining saturation on room air while awake.  Hypertension: Continue hydralazine.  Monitor blood pressure.  Currently blood pressure stable  CKD stage IIIb: Baseline creatinine is from 1.4-1.7.  Currently significant at baseline.  Monitor BMP.  Paroxysmal A-fib: Currently in paced rhythm.  Maintaining rate control .  Continue amiodarone, Eliquis for anticoagulation.  Status post pacemaker  History of coronary artery disease: Status post bypass surgery.  No anginal symptoms.  History of COPD: Currently not in exacerbation.  Hyperlipidemia: Continue statin  Hypothyroidism: Continue Synthyroid  OSA: Continue CPAP  Morbid obesity: BMI of  40.8        DVT prophylaxis:apixaban (ELIQUIS) tablet 2.5 mg Start: 03/04/22 2200 apixaban (ELIQUIS) tablet 2.5 mg     Code Status: Full Code  Family Communication: Called son [name] on phone, call not received.:  Discussed with daughter Shauna Hugh on phone on 7/6  Patient status:Inpatient  Patient is from :Home  Anticipated discharge PP:IRJJ  Estimated DC date: Tomorrow  Consultants: None  Procedures:None  Antimicrobials:  Anti-infectives (From admission, onward)    None       Subjective:  Patient seen and examined at the bedside this morning.  Hemodynamically stable.  Still looks volume overloaded, he needs IV Lasix today.  We discussed about keeping him 1 more night for IV Lasix.  Objective: Vitals:   03/08/22 0622 03/08/22 0738 03/08/22 0815 03/08/22 1122  BP:  123/68  113/84  Pulse:  74  72  Resp:  19  18  Temp:  99.1 F (37.3 C)  97.6 F (36.4 C)  TempSrc:  Oral  Oral  SpO2:  95% 93% 96%  Weight: 123.9 kg     Height:        Intake/Output Summary (Last 24 hours) at 03/08/2022 1140 Last data filed at 03/08/2022  0815 Gross per 24 hour  Intake 960 ml  Output 3100 ml  Net -2140 ml   Filed Weights   03/06/22 0347 03/07/22 0336 03/08/22 0622  Weight: 125.3 kg 125.5 kg 123.9 kg    Examination:  General exam: Overall comfortable, not in distress, morbidly obese HEENT: PERRL Respiratory system: Few crackles bilaterally Cardiovascular system: S1 & S2 heard, RRR.  Gastrointestinal system: Abdomen is nondistended, soft and nontender. Central nervous system: Alert and oriented Extremities: Bilateral lower extremity edema, no clubbing ,no cyanosis Skin: No rashes, no ulcers,no icterus     Data Reviewed: I have personally reviewed following labs and imaging studies  CBC: Recent Labs  Lab 03/04/22 1157 03/04/22 1211 03/04/22 1255 03/05/22 0153  WBC 9.5  --   --  8.2  NEUTROABS 7.2  --   --   --   HGB 14.2 15.0 14.6 13.8  HCT 43.5 44.0 43.0 43.6  MCV 98.4  --   --  97.8  PLT 228  --   --  295   Basic Metabolic Panel: Recent Labs  Lab 03/04/22 1157 03/04/22 1211 03/04/22 1255 03/05/22 0153 03/06/22 0216 03/07/22 0326 03/08/22 0230  NA 138 138 137 141 139 137 138  K 4.0 3.9 4.0 3.9 4.3 3.8 3.9  CL 100 100  --  99 99 101 104  CO2 28  --   --  29 29 27 26   GLUCOSE 120* 121*  --  107* 93 94 100*  BUN 26* 28*  --  28* 28* 28* 32*  CREATININE 1.71* 1.80*  --  1.77* 1.74* 1.77* 1.63*  CALCIUM 8.9  --   --  8.7* 8.9 8.6* 8.5*  MG  --   --   --   --  2.7*  --  2.7*     Recent Results (from the past 240 hour(s))  Resp Panel by RT-PCR (Flu A&B, Covid) Anterior Nasal Swab     Status: None   Collection Time: 03/04/22  3:02 PM   Specimen: Anterior Nasal Swab  Result Value Ref Range Status   SARS Coronavirus 2 by RT PCR NEGATIVE NEGATIVE Final    Comment: (NOTE) SARS-CoV-2 target nucleic acids are NOT DETECTED.  The SARS-CoV-2 RNA is generally detectable in upper respiratory specimens during the acute phase of infection. The lowest concentration of SARS-CoV-2 viral copies this assay  can detect is 138 copies/mL. A negative result does not preclude SARS-Cov-2 infection and should not be used as the sole basis for treatment or other patient management decisions. A negative result may occur with  improper specimen collection/handling, submission of specimen other than nasopharyngeal swab, presence of viral mutation(s) within the areas targeted by this assay, and inadequate number of viral copies(<138 copies/mL). A negative result must be combined with clinical observations, patient history, and epidemiological information. The expected result is Negative.  Fact Sheet for Patients:  EntrepreneurPulse.com.au  Fact Sheet for Healthcare Providers:  IncredibleEmployment.be  This test is no t yet approved or cleared by the [place] FDA and  has been authorized for detection and/or diagnosis of SARS-CoV-2 by FDA under an Emergency Use Authorization (EUA). This  EUA will remain  in effect (meaning this test can be used) for the duration of the COVID-19 declaration under Section 564(b)(1) of the Act, 21 U.S.C.section 360bbb-3(b)(1), unless the authorization is terminated  or revoked sooner.       Influenza A by PCR NEGATIVE NEGATIVE Final   Influenza B by PCR NEGATIVE NEGATIVE Final    Comment: (NOTE) The Xpert Xpress SARS-CoV-2/FLU/RSV plus assay is intended as an aid in the diagnosis of influenza from Nasopharyngeal swab specimens and should not be used as a sole basis for treatment. Nasal washings and aspirates are unacceptable for Xpert Xpress SARS-CoV-2/FLU/RSV testing.  Fact Sheet for Patients: EntrepreneurPulse.com.au  Fact Sheet for Healthcare Providers: IncredibleEmployment.be  This test is not yet approved or cleared by the [place] FDA and has been authorized for detection and/or diagnosis of SARS-CoV-2 by FDA under an Emergency Use Authorization (EUA). This EUA will  remain in effect (meaning this test can be used) for the duration of the COVID-19 declaration under Section 564(b)(1) of the Act, 21 U.S.C. section 360bbb-3(b)(1), unless the authorization is terminated or revoked.  Performed at [facility], [place] [address], [place], [place] [address]      Radiology Studies: No results found.  Scheduled Meds:  amiodarone  100 mg Oral Daily   apixaban  2.5 mg Oral BID   docusate sodium  100 mg Oral BID   empagliflozin  10 mg Oral Daily   fluticasone  1 spray Each Nare Daily   furosemide  60 mg Intravenous BID   hydrALAZINE  50 mg Oral q12n4p   levothyroxine  112 mcg Oral QAC breakfast   pravastatin  20 mg Oral Daily   sodium chloride flush  3 mL Intravenous Q12H   spironolactone  12.5 mg Oral Daily   tamsulosin  0.4 mg Oral QHS   Continuous Infusions:   LOS: 4 days   Shelly Coss, MD [facility] P7/03/2022, 11:40 AM

## 2022-03-08 NOTE — Progress Notes (Signed)
Mobility Specialist Progress Note:   03/08/22 1531  Mobility  Activity Ambulated with assistance in hallway  Level of Assistance Standby assist, set-up cues, supervision of patient - no hands on  Assistive Device Front wheel walker (HHA)  Distance Ambulated (ft) 200 ft  Activity Response Tolerated well  $Mobility charge 1 Mobility   Pt received in bed willing to participate in mobility. No complaints of pain. Required multiple standing rest breaks. Left in bed with call bell in reach and all needs met.   [facility] Hadiyah Maricle Mobility Specialist

## 2022-03-09 LAB — BASIC METABOLIC PANEL
Anion gap: 15 (ref 5–15)
BUN: 34 mg/dL — ABNORMAL HIGH (ref 8–23)
CO2: 26 mmol/L (ref 22–32)
Calcium: 9 mg/dL (ref 8.9–10.3)
Chloride: 100 mmol/L (ref 98–111)
Creatinine, Ser: 1.76 mg/dL — ABNORMAL HIGH (ref 0.61–1.24)
GFR, Estimated: 36 mL/min — ABNORMAL LOW (ref >60)
Glucose, Bld: 101 mg/dL — ABNORMAL HIGH (ref 70–99)
Potassium: 4 mmol/L (ref 3.5–5.1)
Sodium: 141 mmol/L (ref 135–145)

## 2022-03-09 MED ORDER — HYDRALAZINE HCL 50 MG PO TABS: 50.0000 mg | ORAL_TABLET | Freq: Two times a day (BID) | ORAL | 1 refills | Status: AC

## 2022-03-09 NOTE — Discharge Summary (Signed)
Physician Discharge Summary  Levi [name] IHK:[id number] DOB: 11-24-1928 DOA: 03/04/2022  PCP: Bernerd Limbo, MD  Admit date: 03/04/2022 Discharge date: 03/09/2022  Admitted From: Home Disposition:  Home  Discharge Condition:Stable CODE STATUS:FULL Diet recommendation: Heart Healthy  Brief/Interim Summary:  Patient is a 88 year old male with history of combined systolic/diastolic congestive  heart failure, coronary disease status post CABG, COPD, hypertension, paroxysmal A-fib, status post pacemaker implantation, pressure cancer, obesity who presented from home with dyspnea, orthopnea, bilateral lower extremity edema.  On presentation he was in respiratory status and had to be put on BiPAP.  Chest x-ray showed cardiomegaly.  Patient was found to be volume overloaded and was started on IV diuretics.  He qualified for home oxygen.  Currently he is almost euvolemic.  Medically stable for discharge home today.  He takes torsemide at home.  We recommend to follow-up with heart failure team as an outpatient.  Following problems were addressed during his hospitalization:  Acute on chronic combined systolic/diastolic congestive heart failure: Echocardiogram has shown preserved systolic function with EF of 50 to 55%, mild enlargement of the right ventricle, normal left ventricular diastolic parameters.  Previous echo on 03/2020 showed EF of 45 to 75%, grade 2 diastolic dysfunction.   BNP was normal.  But clinically patient appeared volume overloaded.  He has bilateral basilar crackles, lower extremity edema on presentation. Started on Lasix IV.   Also on spironolactone .  Started on SGLT2 inhibitor here. Takes torsemide at home, will resume on discharge. He follows with Dr Harrington Challenger as an outpatient   Acute hypoxic respiratory failure: Multifactorial secondary to CHF exacerbation.  OSA and probably OHS also playing a role here.  He does not use oxygen at home.  He qualified for home oxygen for 3 L/min.   He desaturates during sleep.  Maintaining saturation on room air while awake.   Hypertension: Started on hydralazine.    Currently blood pressure stable   CKD stage IIIb: Baseline creatinine is from 1.4-1.7.  Currently at baseline.  Monitor BMP as an outpatient   Paroxysmal A-fib: Currently in paced rhythm.  Maintaining rate control .  Continue amiodarone, Eliquis for anticoagulation.  Status post pacemaker   History of coronary artery disease: Status post bypass surgery.  No anginal symptoms.   History of COPD: Currently not in exacerbation.   Hyperlipidemia: Continue statin   Hypothyroidism: Continue Synthyroid   OSA: Continue CPAP   Morbid obesity: BMI of  40.8    Discharge Diagnoses:  Principal Problem:   Acute on chronic combined systolic and diastolic CHF (congestive heart failure) (HCC) Active Problems:   Essential hypertension   Chronic kidney disease, stage 3b (HCC)   PAF (paroxysmal atrial fibrillation) (HCC)   H/O coronary artery bypass surgery   HLD (hyperlipidemia)   Class 3 obesity (HCC)   OSA (obstructive sleep apnea)   Presence of permanent cardiac pacemaker   Hypothyroidism    Discharge Instructions  Discharge Instructions     Diet - low sodium heart healthy   Complete by: As directed    Discharge instructions   Complete by: As directed    1)Please take your medications as instructed 2)Follow up with your PCP in a week.  Follow-up with heart failure team as an outpatient. 3)Monitor your salt and water intake at home.  Monitor weight.   Increase activity slowly   Complete by: As directed       Allergies as of 03/09/2022   No Known Allergies  Medication List     TAKE these medications    amiodarone 100 MG tablet Commonly known as: PACERONE Take 1 tablet (100 mg total) by mouth daily. What changed: additional instructions   apixaban 2.5 MG Tabs tablet Commonly known as: Eliquis Take 1 tablet (2.5 mg total) by mouth 2 (two) times  daily.   cetirizine 10 MG tablet Commonly known as: ZYRTEC Take 10 mg by mouth daily as needed for allergies or rhinitis.   Jardiance 10 MG Tabs tablet Generic drug: empagliflozin Take 1 tablet (10 mg total) by mouth daily. What changed: when to take this   levothyroxine 112 MCG tablet Commonly known as: SYNTHROID TAKE 1 TABLET BY MOUTH  DAILY BEFORE BREAKFAST   nitroGLYCERIN 0.4 MG SL tablet Commonly known as: NITROSTAT Place 1 tablet (0.4 mg total) under the tongue every 5 (five) minutes x 3 doses as needed for chest pain.   polyethylene glycol 17 g packet Commonly known as: MIRALAX / GLYCOLAX Take 17 g by mouth daily.   potassium chloride SA 20 MEQ tablet Commonly known as: KLOR-CON M Take 1.5 tablets (30 mEq total) by mouth daily. What changed: additional instructions   pravastatin 20 MG tablet Commonly known as: PRAVACHOL TAKE 1 TABLET BY MOUTH  DAILY   spironolactone 25 MG tablet Commonly known as: Aldactone Take 0.5 tablets (12.5 mg total) by mouth daily.   tamsulosin 0.4 MG Caps capsule Commonly known as: FLOMAX Take 0.4 mg by mouth at bedtime.   torsemide 20 MG tablet Commonly known as: DEMADEX Take 3 tablets (60 mg total) by mouth 2 (two) times daily.               Durable Medical Equipment  (From admission, onward)           Start     Ordered   03/07/22 1343  For home use only DME Other see comment  Once       Comments: Eval for poc  Question:  Length of Need  Answer:  Lifetime   03/07/22 1343   03/07/22 0745  For home use only DME oxygen  Once       Question Answer Comment  Length of Need Lifetime   Mode or (Route) Nasal cannula   Liters per Minute 3   Frequency Continuous (stationary and portable oxygen unit needed)   Oxygen delivery system Gas      03/07/22 0744            Follow-up Information     Home, Medi Follow up.   Why: The home health agency will contact you for the first home visit Contact information: [place] [address] [phone]         Bernerd Limbo, MD. Go on 03/13/2022.   Specialty: Family Medicine Why: @12 :77 with Hyacinth Meeker information: [address] [place] [address] [phone]         [name], MD .   Specialty: Cardiology Contact information: [address] [place] [address] [phone]         [name], MD .   Specialty: Cardiology Contact information: [phone] [place] [address] [phone]         [place]. Go in 10 day(s).   Specialty: Cardiology Why: Hospital follow up PLEASE bring a cuurent medication list to appointment FREE valet parking, Entrance C, off of [address]  Contact information: [address] [phone] [place], [facility] Follow up.   Why: oxygen Contact information: [place] [address] [phone]                No Known Allergies  Consultations: None   Procedures/Studies: ECHOCARDIOGRAM COMPLETE  Result Date: 03/05/2022    ECHOCARDIOGRAM REPORT   Patient Name:   Levi [name] Date of Exam: 03/05/2022 Medical Rec #:  [id number]     Height:       69.0 in Accession #:    [id number]    Weight:       281.7 lb Date of Birth:  November 24, 1928     BSA:          2.390 m Patient Age:    61 years      BP:           132/68 mmHg Patient Gender: M             HR:           75 bpm. Exam Location:  Inpatient Procedure: 2D Echo, Cardiac Doppler, Color Doppler and Intracardiac            Opacification Agent Indications:    CHF  History:        Patient has prior history of Echocardiogram examinations, most                 recent 03/15/2020. CAD, Prior CABG, Signs/Symptoms:Shortness of                 Breath; Risk Factors:Hypertension and Dyslipidemia.  Sonographer:    Merrie Roof RDCS Referring Phys:  Karmen Bongo  Sonographer Comments: No subcostal window. IMPRESSIONS  1. Left ventricular ejection fraction, by estimation, is 50 to 55%. The left ventricle has low normal function. The left ventricle has no regional wall motion abnormalities. Left ventricular diastolic parameters were normal.  2. Right ventricular systolic function is normal. The right ventricular size is mildly enlarged.  3. Right atrial size was moderately dilated.  4. The mitral valve is normal in structure. Trivial mitral valve regurgitation. No evidence of mitral stenosis.  5. Tricuspid valve regurgitation is severe.  6. The aortic valve is normal in structure. Aortic valve regurgitation is not visualized. No aortic stenosis is present.  7. The inferior vena cava is normal in size with greater than 50% respiratory variability, suggesting right atrial pressure of 3 mmHg. FINDINGS  Left Ventricle: Left ventricular ejection fraction, by estimation, is 50 to 55%. The left ventricle has low normal function. The left ventricle has no regional wall motion abnormalities. Definity contrast agent was given IV to delineate the left ventricular endocardial borders. The left ventricular internal cavity size was normal in size. There is no left ventricular hypertrophy. Abnormal (paradoxical) septal motion, consistent with left bundle branch block. Left ventricular diastolic parameters  were normal.  LV Wall Scoring: The apical septal segment and apex are akinetic. Right Ventricle: The right ventricular size is mildly enlarged. No increase in right ventricular wall thickness. Right ventricular systolic function is normal. Left Atrium: Left atrial size was normal in size. Right Atrium: Right atrial size was moderately dilated. Pericardium: There is no evidence of pericardial effusion. Mitral Valve: The mitral valve is normal in structure. Mild mitral annular calcification. Trivial mitral valve regurgitation. No evidence of mitral valve stenosis. Tricuspid  Valve: The tricuspid valve is normal in structure.  Tricuspid valve regurgitation is severe. No evidence of tricuspid stenosis. Aortic Valve: The aortic valve is normal in structure. Aortic valve regurgitation is not visualized. No aortic stenosis is present. Aortic valve mean gradient measures 4.0 mmHg. Aortic valve peak gradient measures 7.1 mmHg. Aortic valve area, by VTI measures 1.83 cm. Pulmonic Valve: The pulmonic valve was normal in structure. Pulmonic valve regurgitation is not visualized. No evidence of pulmonic stenosis. Aorta: The aortic root is normal in size and structure. Venous: The inferior vena cava is normal in size with greater than 50% respiratory variability, suggesting right atrial pressure of 3 mmHg. IAS/Shunts: There is redundancy of the interatrial septum. No atrial level shunt detected by color flow Doppler.  LEFT VENTRICLE PLAX 2D LVIDd:         5.40 cm   Diastology LVIDs:         3.90 cm   LV e' medial:    7.18 cm/s LV PW:         0.90 cm   LV E/e' medial:  14.6 LV IVS:        1.30 cm   LV e' lateral:   11.40 cm/s LVOT diam:     2.10 cm   LV E/e' lateral: 9.2 LV SV:         46 LV SV Index:   19 LVOT Area:     3.46 cm  RIGHT VENTRICLE RV Basal diam:  5.20 cm RV Mid diam:    5.30 cm RV S prime:     8.92 cm/s TAPSE (M-mode): 1.4 cm LEFT ATRIUM           Index        RIGHT ATRIUM           Index LA diam:      5.20 cm 2.18 cm/m   RA Area:     37.50 cm LA Vol (A4C): 71.5 ml 29.92 ml/m  RA Volume:   144.00 ml 60.25 ml/m  AORTIC VALVE AV Area (Vmax):    1.80 cm AV Area (Vmean):   1.67 cm AV Area (VTI):     1.83 cm AV Vmax:           133.00 cm/s AV Vmean:          91.100 cm/s AV VTI:            0.254 m AV Peak Grad:      7.1 mmHg AV Mean Grad:      4.0 mmHg LVOT Vmax:         69.10 cm/s LVOT Vmean:        43.900 cm/s LVOT VTI:          0.134 m LVOT/AV VTI ratio: 0.53  AORTA Ao Root diam: 3.10 cm MITRAL VALVE                TRICUSPID VALVE MV Area (PHT): 4.89 cm     TR Peak grad:   27.5  mmHg MV Decel Time: 155 msec     TR Vmax:        262.00 cm/s MV E velocity: 105.00 cm/s MV A velocity: 62.20 cm/s   SHUNTS MV E/A ratio:  1.69         Systemic VTI:  0.13 m                             Systemic Diam: 2.10 cm Candee Furbish MD Electronically signed by  Candee Furbish MD Signature Date/Time: 03/05/2022/2:14:14 PM    Final    DG Chest Port 1 View  Result Date: 03/04/2022 CLINICAL DATA:  Respiratory distress. EXAM: PORTABLE CHEST 1 VIEW COMPARISON:  Chest radiograph 03/14/2020 and earlier FINDINGS: Left chest wall pacemaker with leads overlying the right atrium and right ventricle. Postoperative changes of median sternotomy and coronary artery bypass graft. Coronary artery stent. Left atrial appendage clip. Stable enlarged cardiac silhouette. Aortic calcifications. New nodular peripheral airspace opacity in the right mid lung. Unchanged bibasilar atelectasis. No pleural effusion or pneumothorax. Anterior cervical spinal fusion. IMPRESSION: New nodular peripheral airspace opacity in the right mid lung may represent infection in the appropriate clinical context versus new pulmonary nodule. Consider CT chest with intravenous contrast for further evaluation. Stable cardiomegaly.  Aortic Atherosclerosis (ICD10-I70.0). Electronically Signed   By: Ileana Roup M.D.   On: 03/04/2022 12:20      Subjective: Patient seen and examined at bedside this morning.  Very eager to go home.  Medically stable for discharge today.Called and discussed with daughter on phone about discharge planning  Discharge Exam: Vitals:   03/09/22 0736 03/09/22 1015  BP: 124/72 133/63  Pulse: 79   Resp: 19   Temp: (!) 97.4 F (36.3 C)   SpO2: 94%    Vitals:   03/09/22 0400 03/09/22 0500 03/09/22 0736 03/09/22 1015  BP: 129/61  124/72 133/63  Pulse:   79   Resp:   19   Temp:   (!) 97.4 F (36.3 C)   TempSrc:   Oral   SpO2: 94%  94%   Weight:  125.7 kg    Height:        General: Pt is alert, awake, not in acute  distress Cardiovascular: RRR, S1/S2 +, no rubs, no gallops Respiratory: CTA bilaterally, no wheezing, no rhonchi,mild crackles on lungs Abdominal: Soft, NT, ND, bowel sounds + Extremities: no edema, no cyanosis    The results of significant diagnostics from this hospitalization (including imaging, microbiology, ancillary and laboratory) are listed below for reference.     Microbiology: Recent Results (from the past 240 hour(s))  Resp Panel by RT-PCR (Flu A&B, Covid) Anterior Nasal Swab     Status: None   Collection Time: 03/04/22  3:02 PM   Specimen: Anterior Nasal Swab  Result Value Ref Range Status   SARS Coronavirus 2 by RT PCR NEGATIVE NEGATIVE Final    Comment: (NOTE) SARS-CoV-2 target nucleic acids are NOT DETECTED.  The SARS-CoV-2 RNA is generally detectable in upper respiratory specimens during the acute phase of infection. The lowest concentration of SARS-CoV-2 viral copies this assay can detect is 138 copies/mL. A negative result does not preclude SARS-Cov-2 infection and should not be used as the sole basis for treatment or other patient management decisions. A negative result may occur with  improper specimen collection/handling, submission of specimen other than nasopharyngeal swab, presence of viral mutation(s) within the areas targeted by this assay, and inadequate number of viral copies(<138 copies/mL). A negative result must be combined with clinical observations, patient history, and epidemiological information. The expected result is Negative.  Fact Sheet for Patients:  EntrepreneurPulse.com.au  Fact Sheet for Healthcare Providers:  IncredibleEmployment.be  This test is no t yet approved or cleared by the [place] FDA and  has been authorized for detection and/or diagnosis of SARS-CoV-2 by FDA under an Emergency Use Authorization (EUA). This EUA will remain  in effect (meaning this test can be used) for the duration  of the  COVID-19 declaration under Section 564(b)(1) of the Act, 21 U.S.C.section 360bbb-3(b)(1), unless the authorization is terminated  or revoked sooner.       Influenza A by PCR NEGATIVE NEGATIVE Final   Influenza B by PCR NEGATIVE NEGATIVE Final    Comment: (NOTE) The Xpert Xpress SARS-CoV-2/FLU/RSV plus assay is intended as an aid in the diagnosis of influenza from Nasopharyngeal swab specimens and should not be used as a sole basis for treatment. Nasal washings and aspirates are unacceptable for Xpert Xpress SARS-CoV-2/FLU/RSV testing.  Fact Sheet for Patients: EntrepreneurPulse.com.au  Fact Sheet for Healthcare Providers: IncredibleEmployment.be  This test is not yet approved or cleared by the [place] FDA and has been authorized for detection and/or diagnosis of SARS-CoV-2 by FDA under an Emergency Use Authorization (EUA). This EUA will remain in effect (meaning this test can be used) for the duration of the COVID-19 declaration under Section 564(b)(1) of the Act, 21 U.S.C. section 360bbb-3(b)(1), unless the authorization is terminated or revoked.  Performed at [facility], [place] [address], [place], [place] [address]      Labs: BNP (last 3 results) Recent Labs    03/04/22 1157  BNP 30.0   Basic Metabolic Panel: Recent Labs  Lab 03/05/22 0153 03/06/22 0216 03/07/22 0326 03/08/22 0230 03/09/22 0141  NA 141 139 137 138 141  K 3.9 4.3 3.8 3.9 4.0  CL 99 99 101 104 100  CO2 29 29 27 26 26   GLUCOSE 107* 93 94 100* 101*  BUN 28* 28* 28* 32* 34*  CREATININE 1.77* 1.74* 1.77* 1.63* 1.76*  CALCIUM 8.7* 8.9 8.6* 8.5* 9.0  MG  --  2.7*  --  2.7*  --    Liver Function Tests: No results for input(s): "AST", "ALT", "ALKPHOS", "BILITOT", "PROT", "ALBUMIN" in the last 168 hours. No results for input(s): "LIPASE", "AMYLASE" in the last 168 hours. No results for input(s): "AMMONIA" in the last 168 hours. CBC: Recent  Labs  Lab 03/04/22 1157 03/04/22 1211 03/04/22 1255 03/05/22 0153  WBC 9.5  --   --  8.2  NEUTROABS 7.2  --   --   --   HGB 14.2 15.0 14.6 13.8  HCT 43.5 44.0 43.0 43.6  MCV 98.4  --   --  97.8  PLT 228  --   --  230   Cardiac Enzymes: No results for input(s): "CKTOTAL", "CKMB", "CKMBINDEX", "TROPONINI" in the last 168 hours. BNP: Invalid input(s): "POCBNP" CBG: No results for input(s): "GLUCAP" in the last 168 hours. D-Dimer No results for input(s): "DDIMER" in the last 72 hours. Hgb A1c No results for input(s): "HGBA1C" in the last 72 hours. Lipid Profile No results for input(s): "CHOL", "HDL", "LDLCALC", "TRIG", "CHOLHDL", "LDLDIRECT" in the last 72 hours. Thyroid function studies No results for input(s): "TSH", "T4TOTAL", "T3FREE", "THYROIDAB" in the last 72 hours.  Invalid input(s): "FREET3" Anemia work up No results for input(s): "VITAMINB12", "FOLATE", "FERRITIN", "TIBC", "IRON", "RETICCTPCT" in the last 72 hours. Urinalysis    Component Value Date/Time   COLORURINE YELLOW 09/05/2021 1552   APPEARANCEUR CLEAR 09/05/2021 1552   LABSPEC 1.012 09/05/2021 1552   PHURINE 5.5 09/05/2021 1552   GLUCOSEU 250 (A) 09/05/2021 1552   HGBUR NEGATIVE 09/05/2021 1552   BILIRUBINUR NEGATIVE 09/05/2021 1552   KETONESUR NEGATIVE 09/05/2021 1552   PROTEINUR TRACE (A) 09/05/2021 1552   UROBILINOGEN 0.2 03/01/2014 1440   NITRITE NEGATIVE 09/05/2021 1552   LEUKOCYTESUR NEGATIVE 09/05/2021 1552   Sepsis Labs Recent Labs  Lab  03/04/22 1157 03/05/22 0153  WBC 9.5 8.2   Microbiology Recent Results (from the past 240 hour(s))  Resp Panel by RT-PCR (Flu A&B, Covid) Anterior Nasal Swab     Status: None   Collection Time: 03/04/22  3:02 PM   Specimen: Anterior Nasal Swab  Result Value Ref Range Status   SARS Coronavirus 2 by RT PCR NEGATIVE NEGATIVE Final    Comment: (NOTE) SARS-CoV-2 target nucleic acids are NOT DETECTED.  The SARS-CoV-2 RNA is generally detectable in upper  respiratory specimens during the acute phase of infection. The lowest concentration of SARS-CoV-2 viral copies this assay can detect is 138 copies/mL. A negative result does not preclude SARS-Cov-2 infection and should not be used as the sole basis for treatment or other patient management decisions. A negative result may occur with  improper specimen collection/handling, submission of specimen other than nasopharyngeal swab, presence of viral mutation(s) within the areas targeted by this assay, and inadequate number of viral copies(<138 copies/mL). A negative result must be combined with clinical observations, patient history, and epidemiological information. The expected result is Negative.  Fact Sheet for Patients:  EntrepreneurPulse.com.au  Fact Sheet for Healthcare Providers:  IncredibleEmployment.be  This test is no t yet approved or cleared by the [place] FDA and  has been authorized for detection and/or diagnosis of SARS-CoV-2 by FDA under an Emergency Use Authorization (EUA). This EUA will remain  in effect (meaning this test can be used) for the duration of the COVID-19 declaration under Section 564(b)(1) of the Act, 21 U.S.C.section 360bbb-3(b)(1), unless the authorization is terminated  or revoked sooner.       Influenza A by PCR NEGATIVE NEGATIVE Final   Influenza B by PCR NEGATIVE NEGATIVE Final    Comment: (NOTE) The Xpert Xpress SARS-CoV-2/FLU/RSV plus assay is intended as an aid in the diagnosis of influenza from Nasopharyngeal swab specimens and should not be used as a sole basis for treatment. Nasal washings and aspirates are unacceptable for Xpert Xpress SARS-CoV-2/FLU/RSV testing.  Fact Sheet for Patients: EntrepreneurPulse.com.au  Fact Sheet for Healthcare Providers: IncredibleEmployment.be  This test is not yet approved or cleared by the [place] FDA and has been  authorized for detection and/or diagnosis of SARS-CoV-2 by FDA under an Emergency Use Authorization (EUA). This EUA will remain in effect (meaning this test can be used) for the duration of the COVID-19 declaration under Section 564(b)(1) of the Act, 21 U.S.C. section 360bbb-3(b)(1), unless the authorization is terminated or revoked.  Performed at [facility], [place] [address], [place],  [address]     Please note: You were cared for by a hospitalist during your hospital stay. Once you are discharged, your primary care physician will handle any further medical issues. Please note that NO REFILLS for any discharge medications will be authorized once you are discharged, as it is imperative that you return to your primary care physician (or establish a relationship with a primary care physician if you do not have one) for your post hospital discharge needs so that they can reassess your need for medications and monitor your lab values.    Time coordinating discharge: 40 minutes  SIGNED:   Shelly Coss, MD  [facility] 03/09/2022, 10:31 AM Pager [phone]  If 7PM-7AM, please contact night-coverage [email or web address] Password [email or web address]

## 2022-03-09 NOTE — Progress Notes (Signed)
Explained discharge instructions to patient and his daughter. Reviewed follow up appointments and next medication administration times, as well as at home oxygen care. Both verbalized having an understanding. All belongings were in the patient's possession at the time of discharge to include his home cpap machine.  Removed his PIV and telemetry monitor. CCMD was notified. Daughter had taken home his jardiance medication from [facility] yesterday. All home oxygen equipment was taken home with patient today. Connected him to 3 liters on his own home equipment.

## 2022-03-12 ENCOUNTER — Other Ambulatory Visit: Payer: Self-pay | Admitting: *Deleted

## 2022-03-12 ENCOUNTER — Telehealth: Payer: Self-pay

## 2022-03-12 DIAGNOSIS — I480 Paroxysmal atrial fibrillation: Secondary | ICD-10-CM

## 2022-03-12 MED ORDER — APIXABAN 2.5 MG PO TABS: 2.5000 mg | ORAL_TABLET | Freq: Two times a day (BID) | ORAL | 1 refills | Status: AC

## 2022-03-12 NOTE — Telephone Encounter (Signed)
I received a dropped off med list from the pts daughter since the pt has had some med changes due to recent admission... I reviewed his med list with her list and it was updated... he has an appt with Nicki Reaper weaver PA on 03/18/22 that I moved to 03/19/22 with Dr Harrington Challenger for continuity of care.

## 2022-03-12 NOTE — Telephone Encounter (Signed)
Eliquis 2.5mg  refill request received. Last Eliquis 2.5mg  refill sent on 01/31/2022 to [facility] but this one is for [facility]. Patient is 88 years old, weight-125.7kg, Crea-1.76 on 03/09/2022, Diagnosis-Afib, and last seen by Dr. Harrington Challenger on 12/20/21. Dose is appropriate based on dosing criteria. Will send in refill to requested pharmacy.

## 2022-03-13 ENCOUNTER — Telehealth: Payer: Self-pay | Admitting: Allergy & Immunology

## 2022-03-13 ENCOUNTER — Telehealth: Payer: Self-pay

## 2022-03-13 NOTE — Telephone Encounter (Signed)
I contacted Levi [name] or the patient's caregiver to inform them that he received an expired dose of Pfizer COVID-19 vaccine from our office, during their visit(s), on June 2022 and December 2022. In this case, I talked to his daughter - [name] - who was going to relay the message to her father.   The dose was expired by 13 and 20 days, resepctively. This was the patient's booster dose. I shared the following information with the patient or caregiver: vaccines given after they have expired may be less effective but we are not aware of any other adverse effects. The patient can be re-vaccinated at no cost if the patient decides to do so. Answered patient questions/concerns. Encouraged patient to reach out if they have any additional questions or concerns.  The patient decided to be re-vaccinated with the reformulated vaccine in the fall.  Salvatore Marvel, MD [facility]

## 2022-03-13 NOTE — Telephone Encounter (Signed)
**Note De-Identified Alphonzo Devera Obfuscation** We received a Eliquis prescription form from [place] [facility] with a request to complete it, have Dr Lovena Le sign and date it, and to fax back to them at [phone].  I have completed the Eliquis 5 mg prescription form for #180 with 3 refills and e-mailed it to the nurse working with Dr Lovena Le tomorrow so he can obtain Dr Tanna Furry signature, date it, and to fax back to [facility] at the fax number written on the cover letter included.

## 2022-03-14 ENCOUNTER — Other Ambulatory Visit: Payer: Self-pay | Admitting: Internal Medicine

## 2022-03-14 NOTE — Telephone Encounter (Signed)
Fax sent as requested.  Confirmation received.

## 2022-03-15 ENCOUNTER — Other Ambulatory Visit: Payer: Self-pay

## 2022-03-15 ENCOUNTER — Encounter (HOSPITAL_COMMUNITY): Payer: Self-pay

## 2022-03-15 ENCOUNTER — Telehealth: Payer: Self-pay | Admitting: Internal Medicine

## 2022-03-15 ENCOUNTER — Inpatient Hospital Stay (HOSPITAL_COMMUNITY)
Admission: EM | Admit: 2022-03-15 | Discharge: 2022-03-22 | DRG: 291 | Disposition: A | Discharge status: Home Health | Payer: Medicare Other | Attending: Internal Medicine | Admitting: Internal Medicine

## 2022-03-15 ENCOUNTER — Emergency Department (HOSPITAL_COMMUNITY): Payer: Medicare Other

## 2022-03-15 DIAGNOSIS — Z9981 Dependence on supplemental oxygen: Secondary | ICD-10-CM

## 2022-03-15 DIAGNOSIS — E662 Morbid (severe) obesity with alveolar hypoventilation: Secondary | ICD-10-CM | POA: Diagnosis present

## 2022-03-15 DIAGNOSIS — I071 Rheumatic tricuspid insufficiency: Secondary | ICD-10-CM | POA: Diagnosis present

## 2022-03-15 DIAGNOSIS — R0600 Dyspnea, unspecified: Principal | ICD-10-CM

## 2022-03-15 DIAGNOSIS — N179 Acute kidney failure, unspecified: Secondary | ICD-10-CM | POA: Diagnosis present

## 2022-03-15 DIAGNOSIS — I509 Heart failure, unspecified: Secondary | ICD-10-CM

## 2022-03-15 DIAGNOSIS — Z602 Problems related to living alone: Secondary | ICD-10-CM | POA: Diagnosis present

## 2022-03-15 DIAGNOSIS — I2510 Atherosclerotic heart disease of native coronary artery without angina pectoris: Secondary | ICD-10-CM | POA: Diagnosis present

## 2022-03-15 DIAGNOSIS — E039 Hypothyroidism, unspecified: Secondary | ICD-10-CM | POA: Diagnosis present

## 2022-03-15 DIAGNOSIS — Z950 Presence of cardiac pacemaker: Secondary | ICD-10-CM

## 2022-03-15 DIAGNOSIS — J439 Emphysema, unspecified: Secondary | ICD-10-CM | POA: Diagnosis present

## 2022-03-15 DIAGNOSIS — Z7901 Long term (current) use of anticoagulants: Secondary | ICD-10-CM

## 2022-03-15 DIAGNOSIS — Z87891 Personal history of nicotine dependence: Secondary | ICD-10-CM

## 2022-03-15 DIAGNOSIS — Z85828 Personal history of other malignant neoplasm of skin: Secondary | ICD-10-CM

## 2022-03-15 DIAGNOSIS — I130 Hypertensive heart and chronic kidney disease with heart failure and stage 1 through stage 4 chronic kidney disease, or unspecified chronic kidney disease: Secondary | ICD-10-CM | POA: Diagnosis not present

## 2022-03-15 DIAGNOSIS — N189 Chronic kidney disease, unspecified: Secondary | ICD-10-CM | POA: Diagnosis present

## 2022-03-15 DIAGNOSIS — I5033 Acute on chronic diastolic (congestive) heart failure: Secondary | ICD-10-CM | POA: Diagnosis present

## 2022-03-15 DIAGNOSIS — E785 Hyperlipidemia, unspecified: Secondary | ICD-10-CM | POA: Diagnosis present

## 2022-03-15 DIAGNOSIS — Z20822 Contact with and (suspected) exposure to covid-19: Secondary | ICD-10-CM | POA: Diagnosis present

## 2022-03-15 DIAGNOSIS — Z955 Presence of coronary angioplasty implant and graft: Secondary | ICD-10-CM

## 2022-03-15 DIAGNOSIS — I5043 Acute on chronic combined systolic (congestive) and diastolic (congestive) heart failure: Secondary | ICD-10-CM

## 2022-03-15 DIAGNOSIS — Z9079 Acquired absence of other genital organ(s): Secondary | ICD-10-CM

## 2022-03-15 DIAGNOSIS — Z79899 Other long term (current) drug therapy: Secondary | ICD-10-CM

## 2022-03-15 DIAGNOSIS — Z981 Arthrodesis status: Secondary | ICD-10-CM

## 2022-03-15 DIAGNOSIS — Z8042 Family history of malignant neoplasm of prostate: Secondary | ICD-10-CM

## 2022-03-15 DIAGNOSIS — Z66 Do not resuscitate: Secondary | ICD-10-CM | POA: Diagnosis present

## 2022-03-15 DIAGNOSIS — I10 Essential (primary) hypertension: Secondary | ICD-10-CM | POA: Diagnosis not present

## 2022-03-15 DIAGNOSIS — H9190 Unspecified hearing loss, unspecified ear: Secondary | ICD-10-CM | POA: Diagnosis present

## 2022-03-15 DIAGNOSIS — Z8546 Personal history of malignant neoplasm of prostate: Secondary | ICD-10-CM

## 2022-03-15 DIAGNOSIS — K5900 Constipation, unspecified: Secondary | ICD-10-CM | POA: Diagnosis not present

## 2022-03-15 DIAGNOSIS — Z8249 Family history of ischemic heart disease and other diseases of the circulatory system: Secondary | ICD-10-CM

## 2022-03-15 DIAGNOSIS — R0602 Shortness of breath: Secondary | ICD-10-CM | POA: Diagnosis not present

## 2022-03-15 DIAGNOSIS — Z951 Presence of aortocoronary bypass graft: Secondary | ICD-10-CM

## 2022-03-15 DIAGNOSIS — J8410 Pulmonary fibrosis, unspecified: Secondary | ICD-10-CM | POA: Diagnosis present

## 2022-03-15 DIAGNOSIS — I5042 Chronic combined systolic (congestive) and diastolic (congestive) heart failure: Secondary | ICD-10-CM | POA: Diagnosis present

## 2022-03-15 DIAGNOSIS — Z79890 Hormone replacement therapy (postmenopausal): Secondary | ICD-10-CM

## 2022-03-15 DIAGNOSIS — J9601 Acute respiratory failure with hypoxia: Secondary | ICD-10-CM | POA: Diagnosis present

## 2022-03-15 DIAGNOSIS — Z6841 Body Mass Index (BMI) 40.0 and over, adult: Secondary | ICD-10-CM

## 2022-03-15 DIAGNOSIS — I480 Paroxysmal atrial fibrillation: Secondary | ICD-10-CM | POA: Diagnosis present

## 2022-03-15 DIAGNOSIS — N1832 Chronic kidney disease, stage 3b: Secondary | ICD-10-CM | POA: Diagnosis not present

## 2022-03-15 DIAGNOSIS — Z7984 Long term (current) use of oral hypoglycemic drugs: Secondary | ICD-10-CM

## 2022-03-15 LAB — CBC WITH DIFFERENTIAL/PLATELET
Abs Immature Granulocytes: 0.05 10*3/uL (ref 0.00–0.07)
Basophils Absolute: 0.1 10*3/uL (ref 0.0–0.1)
Basophils Relative: 1 %
Eosinophils Absolute: 0.2 10*3/uL (ref 0.0–0.5)
Eosinophils Relative: 1 %
HCT: 46.6 % (ref 39.0–52.0)
Hemoglobin: 15 g/dL (ref 13.0–17.0)
Immature Granulocytes: 1 %
Lymphocytes Relative: 9 %
Lymphs Abs: 0.9 10*3/uL (ref 0.7–4.0)
MCH: 30.9 pg (ref 26.0–34.0)
MCHC: 32.2 g/dL (ref 30.0–36.0)
MCV: 96.1 fL (ref 80.0–100.0)
Monocytes Absolute: 0.9 10*3/uL (ref 0.1–1.0)
Monocytes Relative: 9 %
Neutro Abs: 8.5 10*3/uL — ABNORMAL HIGH (ref 1.7–7.7)
Neutrophils Relative %: 79 %
Platelets: 263 10*3/uL (ref 150–400)
RBC: 4.85 MIL/uL (ref 4.22–5.81)
RDW: 14.9 % (ref 11.5–15.5)
WBC: 10.5 10*3/uL (ref 4.0–10.5)
nRBC: 0 % (ref 0.0–0.2)

## 2022-03-15 LAB — COMPREHENSIVE METABOLIC PANEL
ALT: 24 U/L (ref 0–44)
AST: 29 U/L (ref 15–41)
Albumin: 3.5 g/dL (ref 3.5–5.0)
Alkaline Phosphatase: 100 U/L (ref 38–126)
Anion gap: 14 (ref 5–15)
BUN: 27 mg/dL — ABNORMAL HIGH (ref 8–23)
CO2: 28 mmol/L (ref 22–32)
Calcium: 9 mg/dL (ref 8.9–10.3)
Chloride: 96 mmol/L — ABNORMAL LOW (ref 98–111)
Creatinine, Ser: 1.76 mg/dL — ABNORMAL HIGH (ref 0.61–1.24)
GFR, Estimated: 36 mL/min — ABNORMAL LOW (ref >60)
Glucose, Bld: 110 mg/dL — ABNORMAL HIGH (ref 70–99)
Potassium: 4.1 mmol/L (ref 3.5–5.1)
Sodium: 138 mmol/L (ref 135–145)
Total Bilirubin: 0.7 mg/dL (ref 0.3–1.2)
Total Protein: 7.4 g/dL (ref 6.5–8.1)

## 2022-03-15 LAB — SARS CORONAVIRUS 2 BY RT PCR: SARS Coronavirus 2 by RT PCR: NEGATIVE

## 2022-03-15 LAB — TROPONIN I (HIGH SENSITIVITY)
Troponin I (High Sensitivity): 31 ng/L — ABNORMAL HIGH (ref <18)
Troponin I (High Sensitivity): 31 ng/L — ABNORMAL HIGH (ref <18)

## 2022-03-15 LAB — BRAIN NATRIURETIC PEPTIDE: B Natriuretic Peptide: 64.6 pg/mL (ref 0.0–100.0)

## 2022-03-15 MED ORDER — SPIRONOLACTONE 12.5 MG HALF TABLET
12.5000 mg | ORAL_TABLET | Freq: Every day | ORAL | Status: DC
  Administered 2022-03-16 – 2022-03-21 (×6): 12.5 mg via ORAL
  Filled 2022-03-15 (×6): qty 1

## 2022-03-15 MED ORDER — AMIODARONE HCL 100 MG PO TABS
100.0000 mg | ORAL_TABLET | Freq: Every day | ORAL | Status: DC
  Administered 2022-03-16 – 2022-03-22 (×7): 100 mg via ORAL
  Filled 2022-03-15 (×7): qty 1

## 2022-03-15 MED ORDER — OXYCODONE HCL 5 MG PO TABS
5.0000 mg | ORAL_TABLET | ORAL | Status: DC | PRN
  Filled 2022-03-15: qty 1

## 2022-03-15 MED ORDER — LEVOTHYROXINE SODIUM 112 MCG PO TABS
112.0000 ug | ORAL_TABLET | Freq: Every day | ORAL | Status: DC
  Administered 2022-03-16 – 2022-03-22 (×7): 112 ug via ORAL
  Filled 2022-03-15 (×7): qty 1

## 2022-03-15 MED ORDER — ACETAMINOPHEN 325 MG PO TABS: 650.0000 mg | ORAL_TABLET | Freq: Four times a day (QID) | ORAL | Status: DC | PRN

## 2022-03-15 MED ORDER — POLYETHYLENE GLYCOL 3350 17 G PO PACK
17.0000 g | PACK | Freq: Every day | ORAL | Status: DC | PRN
  Administered 2022-03-19: 17 g via ORAL
  Filled 2022-03-15: qty 1

## 2022-03-15 MED ORDER — ALBUTEROL SULFATE (2.5 MG/3ML) 0.083% IN NEBU
2.5000 mg | INHALATION_SOLUTION | Freq: Once | RESPIRATORY_TRACT | Status: AC
  Administered 2022-03-15: 2.5 mg via RESPIRATORY_TRACT
  Filled 2022-03-15: qty 3

## 2022-03-15 MED ORDER — FUROSEMIDE 10 MG/ML IJ SOLN
60.0000 mg | Freq: Once | INTRAMUSCULAR | Status: AC
  Administered 2022-03-15: 60 mg via INTRAVENOUS
  Filled 2022-03-15: qty 6

## 2022-03-15 MED ORDER — ONDANSETRON HCL 4 MG/2ML IJ SOLN: 4.0000 mg | Freq: Four times a day (QID) | INTRAMUSCULAR | Status: DC | PRN

## 2022-03-15 MED ORDER — STERILE WATER FOR INJECTION IJ SOLN
INTRAMUSCULAR | Status: AC
  Administered 2022-03-15: 2 mL
  Filled 2022-03-15: qty 10

## 2022-03-15 MED ORDER — METHYLPREDNISOLONE SODIUM SUCC 125 MG IJ SOLR
125.0000 mg | Freq: Once | INTRAMUSCULAR | Status: AC
  Administered 2022-03-15: 125 mg via INTRAVENOUS
  Filled 2022-03-15: qty 2

## 2022-03-15 MED ORDER — ONDANSETRON HCL 4 MG PO TABS: 4.0000 mg | ORAL_TABLET | Freq: Four times a day (QID) | ORAL | Status: DC | PRN

## 2022-03-15 MED ORDER — FUROSEMIDE 10 MG/ML IJ SOLN
40.0000 mg | Freq: Two times a day (BID) | INTRAMUSCULAR | Status: DC
  Administered 2022-03-15 – 2022-03-16 (×2): 40 mg via INTRAVENOUS
  Filled 2022-03-15 (×2): qty 4

## 2022-03-15 MED ORDER — HYDRALAZINE HCL 20 MG/ML IJ SOLN
5.0000 mg | INTRAMUSCULAR | Status: DC | PRN
Start: 2022-03-15 — End: 2022-03-16

## 2022-03-15 MED ORDER — PRAVASTATIN SODIUM 10 MG PO TABS
20.0000 mg | ORAL_TABLET | Freq: Every day | ORAL | Status: DC
  Administered 2022-03-16 – 2022-03-22 (×7): 20 mg via ORAL
  Filled 2022-03-15 (×7): qty 2

## 2022-03-15 MED ORDER — TAMSULOSIN HCL 0.4 MG PO CAPS
0.4000 mg | ORAL_CAPSULE | Freq: Every day | ORAL | Status: DC
Start: 2022-03-15 — End: 2022-03-22
  Administered 2022-03-15 – 2022-03-21 (×7): 0.4 mg via ORAL
  Filled 2022-03-15 (×7): qty 1

## 2022-03-15 MED ORDER — HYDRALAZINE HCL 50 MG PO TABS
50.0000 mg | ORAL_TABLET | Freq: Two times a day (BID) | ORAL | Status: DC
  Administered 2022-03-15 – 2022-03-21 (×13): 50 mg via ORAL
  Filled 2022-03-15 (×13): qty 1

## 2022-03-15 MED ORDER — DOCUSATE SODIUM 100 MG PO CAPS
100.0000 mg | ORAL_CAPSULE | Freq: Two times a day (BID) | ORAL | Status: DC
Start: 2022-03-15 — End: 2022-03-22
  Administered 2022-03-15 – 2022-03-22 (×11): 100 mg via ORAL
  Filled 2022-03-15 (×15): qty 1

## 2022-03-15 MED ORDER — ACETAMINOPHEN 650 MG RE SUPP: 650.0000 mg | Freq: Four times a day (QID) | RECTAL | Status: DC | PRN

## 2022-03-15 MED ORDER — SODIUM CHLORIDE 0.9% FLUSH
3.0000 mL | Freq: Two times a day (BID) | INTRAVENOUS | Status: DC
Start: 2022-03-15 — End: 2022-03-22
  Administered 2022-03-15 – 2022-03-22 (×14): 3 mL via INTRAVENOUS

## 2022-03-15 MED ORDER — TRAZODONE HCL 50 MG PO TABS
25.0000 mg | ORAL_TABLET | Freq: Every evening | ORAL | Status: DC | PRN
  Administered 2022-03-21: 25 mg via ORAL
  Filled 2022-03-15: qty 1

## 2022-03-15 MED ORDER — POLYETHYLENE GLYCOL 3350 17 G PO PACK
17.0000 g | PACK | Freq: Every day | ORAL | Status: DC
Start: 2022-03-15 — End: 2022-03-22
  Administered 2022-03-19 – 2022-03-21 (×3): 17 g via ORAL
  Filled 2022-03-15 (×3): qty 1

## 2022-03-15 MED ORDER — BISACODYL 5 MG PO TBEC
5.0000 mg | DELAYED_RELEASE_TABLET | Freq: Every day | ORAL | Status: DC | PRN
  Administered 2022-03-19: 5 mg via ORAL
  Filled 2022-03-15: qty 1

## 2022-03-15 MED ORDER — APIXABAN 2.5 MG PO TABS
2.5000 mg | ORAL_TABLET | Freq: Two times a day (BID) | ORAL | Status: DC
  Administered 2022-03-15 – 2022-03-22 (×14): 2.5 mg via ORAL
  Filled 2022-03-15 (×14): qty 1

## 2022-03-15 MED ORDER — IPRATROPIUM-ALBUTEROL 0.5-2.5 (3) MG/3ML IN SOLN
3.0000 mL | Freq: Once | RESPIRATORY_TRACT | Status: AC
  Administered 2022-03-15: 3 mL via RESPIRATORY_TRACT
  Filled 2022-03-15: qty 3

## 2022-03-15 NOTE — ED Provider Notes (Signed)
[facility] EMERGENCY DEPARTMENT Provider Note   CSN: [id number] Arrival date & time: 03/15/22  1356     History  Chief Complaint  Patient presents with   Shortness of Breath    Levi [name] is a 88 y.o. male.   Shortness of Breath Patient resents with shortness of breath.  Has had over the last few days.  Did have admission in the hospital for CHF exacerbation and discharged about a week ago.  States he was feeling a little better than but is gotten somewhat worse since.  States weight has gone up but also states that his weight is down 2 pounds from discharge.  Home health nurse sent him in after discussing with cardiology.  No real chest pain.  Occasional cough.  Is on oxygen at 3 L at baseline.  States he has felt bad for 3 years.     Home Medications Prior to Admission medications   Medication Sig Start Date End Date Taking? Authorizing Provider  amiodarone (PACERONE) 100 MG tablet Take 1 tablet (100 mg total) by mouth daily. Patient taking differently: Take 100 mg by mouth daily. Taking in the morning 09/27/21   [name], MD  apixaban (ELIQUIS) 2.5 MG TABS tablet Take 1 tablet (2.5 mg total) by mouth 2 (two) times daily. 03/12/22   [name], MD  cetirizine (ZYRTEC) 10 MG tablet Take 10 mg by mouth daily as needed for allergies or rhinitis.     [provider]  empagliflozin (JARDIANCE) 10 MG TABS tablet Take 1 tablet (10 mg total) by mouth daily. 03/09/22   Shelly Coss, MD  hydrALAZINE (APRESOLINE) 50 MG tablet Take 1 tablet (50 mg total) by mouth 2 times daily at 12 noon and 4 pm. 03/09/22   Shelly Coss, MD  levothyroxine (SYNTHROID) 112 MCG tablet TAKE 1 TABLET BY MOUTH  DAILY BEFORE BREAKFAST 11/30/21   [name], MD  nitroGLYCERIN (NITROSTAT) 0.4 MG SL tablet Place 1 tablet (0.4 mg total) under the tongue every 5 (five) minutes x 3 doses as needed for chest pain. 03/22/20   Richardson Dopp T, PA-C  polyethylene glycol (MIRALAX  / GLYCOLAX) 17 g packet Take 17 g by mouth daily.     [provider]  potassium chloride SA (KLOR-CON) 20 MEQ tablet Take 1.5 tablets (30 mEq total) by mouth daily. Patient taking differently: Take 30 mEq by mouth daily. Taking in the morning 06/15/21   Richardson Dopp T, PA-C  pravastatin (PRAVACHOL) 20 MG tablet TAKE 1 TABLET BY MOUTH  DAILY Patient taking differently: Take 20 mg by mouth daily. 08/31/21   [name], MD  spironolactone (ALDACTONE) 25 MG tablet Take 0.5 tablets (12.5 mg total) by mouth daily. 06/14/21   Richardson Dopp T, PA-C  tamsulosin (FLOMAX) 0.4 MG CAPS capsule Take 0.4 mg by mouth at bedtime.     [provider]  torsemide (DEMADEX) 20 MG tablet Take 3 tablets (60 mg total) by mouth 2 (two) times daily. 05/15/21   [name], MD      Allergies    Patient has no known allergies.    Review of Systems   Review of Systems  Respiratory:  Positive for shortness of breath.     Physical Exam Updated Vital Signs BP (!) 139/57 (BP Location: Left Arm)   Pulse 73   Temp (!) 97.4 F (36.3 C) (Oral)   Resp (!) 26   SpO2 93%  Physical Exam Vitals and  nursing note reviewed.  HENT:     Head: Normocephalic.  Cardiovascular:     Rate and Rhythm: Regular rhythm.  Pulmonary:     Breath sounds: Wheezing present.  Chest:     Chest wall: No tenderness.  Musculoskeletal:     Comments: Only mild edema bilateral lower extremities.  Skin:    General: Skin is warm.     Capillary Refill: Capillary refill takes less than 2 seconds.  Neurological:     Mental Status: He is alert.     ED Results / Procedures / Treatments   Labs (all labs ordered are listed, but only abnormal results are displayed) Labs Reviewed  CBC WITH DIFFERENTIAL/PLATELET - Abnormal; Notable for the following components:      Result Value   Neutro Abs 8.5 (*)    All other components within normal limits  COMPREHENSIVE METABOLIC PANEL  BRAIN NATRIURETIC PEPTIDE  TROPONIN I (HIGH  SENSITIVITY)    EKG None  Radiology No results found.  Procedures Procedures    Medications Ordered in ED Medications  ipratropium-albuterol (DUONEB) 0.5-2.5 (3) MG/3ML nebulizer solution 3 mL (3 mLs Nebulization Given 03/15/22 1424)    ED Course/ Medical Decision Making/ A&P                           Medical Decision Making Amount and/or Complexity of Data Reviewed Labs: ordered. Radiology: ordered.  Risk Prescription drug management.   Patient presents with shortness of breath.  Cough and does have some wheezes.  Also recent history of CHF.  Reviewed notes from home health nurse and apparently weight is down compared to discharge.  However is having some wheezing.  Chest x-ray pending.  EKG is paced.  Reviewed recent discharge note.  Is on his baseline oxygen at 3 L.  Care will be turned over to oncoming provider, Dr. Almyra Free.        Final Clinical Impression(s) / ED Diagnoses Final diagnoses:  None    Rx / DC Orders ED Discharge Orders     None         Davonna Belling, MD 03/15/22 1450

## 2022-03-15 NOTE — ED Notes (Signed)
Transport here for pt

## 2022-03-15 NOTE — Telephone Encounter (Signed)
Dtr called back. Explained rationale for pt needing to go to ED for evaluation. She thanks me for explaining and speaking with her.

## 2022-03-15 NOTE — ED Notes (Signed)
Transport requested at this time.

## 2022-03-15 NOTE — Telephone Encounter (Signed)
HHRN calling in to update [place] on pt status. This is the first day she has seen him. D/c from hospital 7/8 for CHF exacerbation/resp failure. Pt is on home O2 continuous 3L face mask. Respirations 32/min Pulse 56. Pre adx wt 289 lb, 273 lb when first home, currently 271 lb Slight BLEE Pt does not appear to be in distress per [facility]. Family reports breathing has been worsening over the last few days. Discussed w/ DOD ,Dr. Angelena Form.  Advised HHRN that pt needs to go back to ED for evaluation.  She verbalized understanding and agreeable to plan.

## 2022-03-15 NOTE — ED Triage Notes (Signed)
Pt to ED via [facility] c/o sob and fluid retention. Pt arrived at 96% on 3L simple mask.

## 2022-03-15 NOTE — H&P (Addendum)
History and Physical    Patient: Levi [name] XBJ:[id number] DOB: Feb 25, 1929 DOA: 03/15/2022 DOS: the patient was seen and examined on 03/15/2022 PCP: Bernerd Limbo, MD  Patient coming from: Home - lives alone, daughter lives next door; NOK: Daughter, Shauna Hugh, [phone]    Chief Complaint: SOB  HPI: Levi [name] is a 88 y.o. male with medical history significant of CAD s/p CABG; chronic combined CHF; COPD; HTN; HLD; morbid obesity; afib; pacemaker placement; and prostate CA presenting with SOB.  He was last admitted from 7/3-8 with acute on chronic combined CHF, treated with Lasix and spironolactone and started on SGLT2 inhibitor.  OSA/OHS were thought to be contributors and he was discharged on home O2.  He has been on his home O2 and thought he was doing ok but has been more SOB.  +LE edema, maybe worse.  No weight gain.  Minimal cough.      ER Course:  Recently admitted, here with SOB.  CXR with ?edema, weight not changed.  Appears miserable, though.  Still on 3L [place] O2, given Lasix and breathing treatments.  Not hypoxia here, maybe with ambulation.     Review of Systems: As mentioned in the history of present illness. All other systems reviewed and are negative. Past Medical History:  Diagnosis Date   Asthma    Benign neoplasm of colon    CAD (coronary artery disease) Dec 2011   s/p CABG per Dr. Roxy Manns; had normal EF; All SVGs occluded per follow up cath with only LIMA to LAD patent; s/p PCI of the proximal and mid LAD February 2014 per Dr. Burt Knack   Chronic combined systolic and diastolic CHF (congestive heart failure) ([place])    CKD (chronic kidney disease), stage II    Emphysema    "never treated for it" (10/05/2015)   History of hiatal hernia    HLD (hyperlipidemia)    HTN (hypertension)    Malaise and fatigue    Obesity    Osteopenia    PAF (paroxysmal atrial fibrillation) (HCC)    Pneumonia 1960s X 1; 1990s X 1   Presence of permanent cardiac pacemaker     Prostate cancer ([place])    Skin cancer    "face, nose, top of ears, scalp"   Symptomatic bradycardia    STJ PPM, 10/05/15, Dr. Lovena Le   Past Surgical History:  Procedure Laterality Date   ANTERIOR CERVICAL DECOMP/DISCECTOMY FUSION     BACK SURGERY     [place] EXTRACTION Right    CORONARY ANGIOPLASTY WITH STENT PLACEMENT  10/14/12   with stent to proximal and mid LAD   CORONARY ARTERY BYPASS GRAFT  08/23/10   "CABG X4"   EP IMPLANTABLE DEVICE N/A 10/05/2015   Procedure: Pacemaker Implant;  Surgeon: [name], MD;  Location: [facility];  Service: Cardiovascular;  Laterality: N/A;   EP IMPLANTABLE DEVICE N/A 10/06/2015   Procedure: Lead Revision/Repair;  Surgeon: [name], MD;  Location: [facility];  Service: Cardiovascular;  Laterality: N/A;   EYE SURGERY     INSERT / REPLACE / REMOVE PACEMAKER  10/05/2015   LEFT HEART CATHETERIZATION WITH CORONARY/GRAFT ANGIOGRAM N/A 09/01/2012   Procedure: LEFT HEART CATHETERIZATION WITH [name];  Surgeon: [name], MD;  Location: [facility] CATH LAB;  Service: Cardiovascular;  Laterality: N/A;   MEDIAN STERNOTOMY  08/23/10   MOHS SURGERY  "@ least twice"   PACEMAKER INSERTION     STJ  10/05/15   PERCUTANEOUS CORONARY STENT INTERVENTION (PCI-S) N/A 10/14/2012   Procedure: PERCUTANEOUS CORONARY STENT INTERVENTION (PCI-S);  Surgeon: [name], MD;  Location: [facility] CATH LAB;  Service: Cardiovascular;  Laterality: N/A;   PROSTATE BIOPSY     TRANSURETHRAL RESECTION OF PROSTATE     Social History:  reports that he quit smoking about 46 years ago. His smoking use included cigarettes. He has a 30.00 pack-year smoking history. He quit smokeless tobacco use about 6 years ago.  His smokeless tobacco use included chew. He reports that he does not drink alcohol and does not use drugs.  No Known Allergies  Family History  Problem Relation Age of Onset   Kidney cancer Father    Heart attack  Father    Hypertension Mother    Stroke Mother    Heart disease Brother    Skin cancer Brother    Prostate cancer Brother    Allergic rhinitis Neg Hx    Asthma Neg Hx    Eczema Neg Hx    Urticaria Neg Hx     Prior to Admission medications   Medication Sig Start Date End Date Taking? Authorizing Provider  amiodarone (PACERONE) 100 MG tablet Take 1 tablet (100 mg total) by mouth daily. Patient taking differently: Take 100 mg by mouth daily. Taking in the morning 09/27/21   [name], MD  apixaban (ELIQUIS) 2.5 MG TABS tablet Take 1 tablet (2.5 mg total) by mouth 2 (two) times daily. 03/12/22   [name], MD  cetirizine (ZYRTEC) 10 MG tablet Take 10 mg by mouth daily as needed for allergies or rhinitis.     [provider]  empagliflozin (JARDIANCE) 10 MG TABS tablet Take 1 tablet (10 mg total) by mouth daily. 03/09/22   Shelly Coss, MD  hydrALAZINE (APRESOLINE) 50 MG tablet Take 1 tablet (50 mg total) by mouth 2 times daily at 12 noon and 4 pm. 03/09/22   Shelly Coss, MD  levothyroxine (SYNTHROID) 112 MCG tablet TAKE 1 TABLET BY MOUTH  DAILY BEFORE BREAKFAST 11/30/21   [name], MD  nitroGLYCERIN (NITROSTAT) 0.4 MG SL tablet Place 1 tablet (0.4 mg total) under the tongue every 5 (five) minutes x 3 doses as needed for chest pain. 03/22/20   Richardson Dopp T, PA-C  polyethylene glycol (MIRALAX / GLYCOLAX) 17 g packet Take 17 g by mouth daily.     [provider]  potassium chloride SA (KLOR-CON) 20 MEQ tablet Take 1.5 tablets (30 mEq total) by mouth daily. Patient taking differently: Take 30 mEq by mouth daily. Taking in the morning 06/15/21   Richardson Dopp T, PA-C  pravastatin (PRAVACHOL) 20 MG tablet TAKE 1 TABLET BY MOUTH  DAILY Patient taking differently: Take 20 mg by mouth daily. 08/31/21   [name], MD  spironolactone (ALDACTONE) 25 MG tablet Take 0.5 tablets (12.5 mg total) by mouth daily. 06/14/21   Richardson Dopp T, PA-C  tamsulosin (FLOMAX) 0.4 MG  CAPS capsule Take 0.4 mg by mouth at bedtime.     [provider]  torsemide (DEMADEX) 20 MG tablet Take 3 tablets (60 mg total) by mouth 2 (two) times daily. 05/15/21   [name], MD    Physical Exam: Vitals:   03/15/22 1502 03/15/22 1532 03/15/22 1602 03/15/22 1632  BP: 130/69 132/68 140/70 (!) 143/75  Pulse: 70 73 70 72  Resp: (!) 22 (!) 27 (!) 23 (!) 23  Temp:      TempSrc:  SpO2: 93% 93% 94% 94%  Weight:       General:  Appears frail, tachycapneic with conversation, on face mask O2 with sats about 92-93%  Eyes:  EOMI, normal lids, iris, some tearing ENT:  hard of hearing, grossly normal lips & tongue, mmm Neck:  no LAD, masses or thyromegaly Cardiovascular:  RRR, no m/r/g. 2+ LE edema.  Respiratory:   CTA bilaterally with scattered wheezes and with diminished breath sounds diffusely.  Mildly increased respiratory effort, worse with conversation Abdomen:  soft, NT, ND Skin:  no rash or induration seen on limited exam Musculoskeletal:  grossly normal tone BUE/BLE, good ROM, no bony abnormality Psychiatric:  blunted mood and affect, speech fluent and appropriate  Neurologic:  CN 2-12 grossly intact, moves all extremities in coordinated fashion   Radiological Exams on Admission: Independently reviewed - see discussion in A/P where applicable  DG Chest Portable 1 View  Result Date: 03/15/2022 CLINICAL DATA:  Shortness of breath EXAM: PORTABLE CHEST 1 VIEW COMPARISON:  Chest x-ray dated March 04, 2022 FINDINGS: Left chest wall dual lead pacer. Cardiac and mediastinal contours are unchanged post median sternotomy and CABG. Persistent focal consolidation of the mid right lung. Background lower lung predominant heterogeneous opacities are slightly increased when compared with prior exam. Trace right pleural effusion. No evidence of pneumothorax. IMPRESSION: 1. Persistent focal consolidation of the right mid lung, possibly due to infection. Recommend radiographic follow-up  to ensure resolution. 2. Increased background heterogeneous opacities, likely due to worsening pulmonary edema. Electronically Signed   By: Yetta Glassman M.D.   On: 03/15/2022 14:54    EKG: Independently reviewed.  Paced rhythm with rate 70; prolonged QTc 521; IVCD, LVH with no evidence of acute ischemia   Labs on Admission: I have personally reviewed the available labs and imaging studies at the time of the admission.  Pertinent labs:    Glucose 110 BUN 27/Creatinine 1.76/GFR 36 - stable BNP 64.6 HS troponin 31 Normal CBC   Assessment and Plan: Principal Problem:   Dyspnea Active Problems:   Essential hypertension   Chronic kidney disease, stage 3b (HCC)   PAF (paroxysmal atrial fibrillation) (HCC)   HLD (hyperlipidemia)   Presence of permanent cardiac pacemaker   Hypothyroidism   Chronic combined systolic and diastolic CHF (congestive heart failure) (HCC)    SOB -Patient with recent admission for acute on chronic combined CHF -He was discharged on home O2 -He has been increasingly SOB recently with ?worsening edema but no weight gain -Labs are not overly suggestive of either infection or CHF and exam is also not helpful in determining etiology -CXR is suggestive of either PNA, CHF, or both -For now, will observe with respiratory support -This is 2 quick ER visits and in a patient this elderly it raises concern -We discussed the possible worsening organ failure and possible disease trajectory -He remains a full code but is open to discussion with palliative care - which is likely to be very beneficial -Will observe on telemetry for now  Chronic systolic CHF -Patient with prior h/o chronic combined CHF (echo in 03/2020 with EF 45-50% and grade 2 diastolic dysfunction) but echo done earlier this month shows EF 50-55% with normal diastolic function -CXR with possible pulmonary edema but not a clear etiology here -Normal BNP -CHF order set utilized; may need CHF team  consult but will hold for now and watch clinically -Was given Lasix 60 mg x 1 in ER and will repeat with 40 mg IV BID -Continue  [place] O2 prn for now -Stable kidney function at this time, will follow -Mildly elevated HS troponin is likely related to demand ischemia; doubt ACS based on symptoms but will repeat -Hold Jardiance for now   HTN -Continue home spironolactone, hydralazine -Will also add prn hydralazine   HLD -Continue Pravachol   CAD  -s/p CABG -Has pacer   Afib -Continue Amiodarone for rate control, has pacemaker -Continue Eliquis   Hypothyroidism -Continue Synthroid   Stage 3b CKD -Appears to be stable at this time -Will recheck BMP tomorrow to ensure stability with diuresis   OSA -Continue qhs CPAP   Obesity -Body mass index is 40.76 (down 1.5 kg since last admission) -Weight loss should be encouraged -Outpatient PCP/bariatric medicine f/u encouraged         Advance Care Planning:   Code Status: Full Code   Consults: Palliative care; CHF navigator; TOC team; nutrition; PT/OT  DVT Prophylaxis: Eliquis  Family Communication: Daughter was present throughout evaluation  Severity of Illness: The appropriate patient status for this patient is OBSERVATION. Observation status is judged to be reasonable and necessary in order to provide the required intensity of service to ensure the patient's safety. The patient's presenting symptoms, physical exam findings, and initial radiographic and laboratory data in the context of their medical condition is felt to place them at decreased risk for further clinical deterioration. Furthermore, it is anticipated that the patient will be medically stable for discharge from the hospital within 2 midnights of admission.   Author: Karmen Bongo, MD 03/15/2022 5:59 PM  For on call review [email or web address].

## 2022-03-15 NOTE — Progress Notes (Signed)
Patient has home CPAP for use and will self place when ready.  

## 2022-03-15 NOTE — ED Provider Notes (Signed)
Patient signed out to me is pending repeat evaluation.  On my exam he still appears tachypneic respirate about 25 to 28 breaths/min.  I see accessory muscle use with each breath.  Patient otherwise living alone with daughter checking on him daily now.  He does not have any increased weight gain, proBNP appears the same as always, lung exam is relatively clear questionable mild wheezes.  Etiology of his shortness of breath may be multifactorial.  Given IV Lasix IV Solu-Medrol breathing treatments.  Admitted to the hospitalist team.   Luna Fuse, MD 03/15/22 [id number]

## 2022-03-15 NOTE — Telephone Encounter (Signed)
Claiborne Billings, home health nurse for patient, is calling to talk with triage nurse in regards to patient's health

## 2022-03-16 DIAGNOSIS — Z7189 Other specified counseling: Secondary | ICD-10-CM | POA: Diagnosis not present

## 2022-03-16 DIAGNOSIS — Z20822 Contact with and (suspected) exposure to covid-19: Secondary | ICD-10-CM | POA: Diagnosis present

## 2022-03-16 DIAGNOSIS — I5033 Acute on chronic diastolic (congestive) heart failure: Secondary | ICD-10-CM | POA: Diagnosis present

## 2022-03-16 DIAGNOSIS — Z7901 Long term (current) use of anticoagulants: Secondary | ICD-10-CM | POA: Diagnosis not present

## 2022-03-16 DIAGNOSIS — N179 Acute kidney failure, unspecified: Secondary | ICD-10-CM | POA: Diagnosis present

## 2022-03-16 DIAGNOSIS — H9190 Unspecified hearing loss, unspecified ear: Secondary | ICD-10-CM | POA: Diagnosis present

## 2022-03-16 DIAGNOSIS — E039 Hypothyroidism, unspecified: Secondary | ICD-10-CM | POA: Diagnosis present

## 2022-03-16 DIAGNOSIS — J9601 Acute respiratory failure with hypoxia: Secondary | ICD-10-CM | POA: Diagnosis present

## 2022-03-16 DIAGNOSIS — Z7984 Long term (current) use of oral hypoglycemic drugs: Secondary | ICD-10-CM | POA: Diagnosis not present

## 2022-03-16 DIAGNOSIS — Z79899 Other long term (current) drug therapy: Secondary | ICD-10-CM | POA: Diagnosis not present

## 2022-03-16 DIAGNOSIS — J439 Emphysema, unspecified: Secondary | ICD-10-CM | POA: Diagnosis present

## 2022-03-16 DIAGNOSIS — N189 Chronic kidney disease, unspecified: Secondary | ICD-10-CM

## 2022-03-16 DIAGNOSIS — I071 Rheumatic tricuspid insufficiency: Secondary | ICD-10-CM | POA: Diagnosis present

## 2022-03-16 DIAGNOSIS — I480 Paroxysmal atrial fibrillation: Secondary | ICD-10-CM | POA: Diagnosis present

## 2022-03-16 DIAGNOSIS — J8410 Pulmonary fibrosis, unspecified: Secondary | ICD-10-CM | POA: Diagnosis present

## 2022-03-16 DIAGNOSIS — Z515 Encounter for palliative care: Secondary | ICD-10-CM

## 2022-03-16 DIAGNOSIS — E662 Morbid (severe) obesity with alveolar hypoventilation: Secondary | ICD-10-CM | POA: Diagnosis present

## 2022-03-16 DIAGNOSIS — Z9981 Dependence on supplemental oxygen: Secondary | ICD-10-CM | POA: Diagnosis not present

## 2022-03-16 DIAGNOSIS — Z79890 Hormone replacement therapy (postmenopausal): Secondary | ICD-10-CM | POA: Diagnosis not present

## 2022-03-16 DIAGNOSIS — R0600 Dyspnea, unspecified: Secondary | ICD-10-CM | POA: Diagnosis present

## 2022-03-16 DIAGNOSIS — K5900 Constipation, unspecified: Secondary | ICD-10-CM | POA: Diagnosis not present

## 2022-03-16 DIAGNOSIS — Z66 Do not resuscitate: Secondary | ICD-10-CM | POA: Diagnosis present

## 2022-03-16 DIAGNOSIS — Z602 Problems related to living alone: Secondary | ICD-10-CM | POA: Diagnosis present

## 2022-03-16 DIAGNOSIS — I5043 Acute on chronic combined systolic (congestive) and diastolic (congestive) heart failure: Secondary | ICD-10-CM | POA: Diagnosis not present

## 2022-03-16 DIAGNOSIS — N1832 Chronic kidney disease, stage 3b: Secondary | ICD-10-CM | POA: Diagnosis present

## 2022-03-16 DIAGNOSIS — I10 Essential (primary) hypertension: Secondary | ICD-10-CM | POA: Diagnosis not present

## 2022-03-16 DIAGNOSIS — I130 Hypertensive heart and chronic kidney disease with heart failure and stage 1 through stage 4 chronic kidney disease, or unspecified chronic kidney disease: Secondary | ICD-10-CM | POA: Diagnosis present

## 2022-03-16 DIAGNOSIS — I2510 Atherosclerotic heart disease of native coronary artery without angina pectoris: Secondary | ICD-10-CM | POA: Diagnosis present

## 2022-03-16 DIAGNOSIS — Z6841 Body Mass Index (BMI) 40.0 and over, adult: Secondary | ICD-10-CM | POA: Diagnosis not present

## 2022-03-16 DIAGNOSIS — I509 Heart failure, unspecified: Secondary | ICD-10-CM | POA: Diagnosis not present

## 2022-03-16 DIAGNOSIS — E785 Hyperlipidemia, unspecified: Secondary | ICD-10-CM

## 2022-03-16 LAB — BASIC METABOLIC PANEL
Anion gap: 13 (ref 5–15)
BUN: 31 mg/dL — ABNORMAL HIGH (ref 8–23)
CO2: 29 mmol/L (ref 22–32)
Calcium: 9 mg/dL (ref 8.9–10.3)
Chloride: 97 mmol/L — ABNORMAL LOW (ref 98–111)
Creatinine, Ser: 1.93 mg/dL — ABNORMAL HIGH (ref 0.61–1.24)
GFR, Estimated: 32 mL/min — ABNORMAL LOW (ref >60)
Glucose, Bld: 168 mg/dL — ABNORMAL HIGH (ref 70–99)
Potassium: 4.5 mmol/L (ref 3.5–5.1)
Sodium: 139 mmol/L (ref 135–145)

## 2022-03-16 LAB — CBC
HCT: 44.9 % (ref 39.0–52.0)
Hemoglobin: 14.7 g/dL (ref 13.0–17.0)
MCH: 31 pg (ref 26.0–34.0)
MCHC: 32.7 g/dL (ref 30.0–36.0)
MCV: 94.7 fL (ref 80.0–100.0)
Platelets: 239 10*3/uL (ref 150–400)
RBC: 4.74 MIL/uL (ref 4.22–5.81)
RDW: 14.8 % (ref 11.5–15.5)
WBC: 7.6 10*3/uL (ref 4.0–10.5)
nRBC: 0 % (ref 0.0–0.2)

## 2022-03-16 MED ORDER — FUROSEMIDE 10 MG/ML IJ SOLN
60.0000 mg | Freq: Two times a day (BID) | INTRAMUSCULAR | Status: DC
  Administered 2022-03-16 – 2022-03-19 (×6): 60 mg via INTRAVENOUS
  Filled 2022-03-16 (×6): qty 6

## 2022-03-16 NOTE — Assessment & Plan Note (Signed)
Calculated BMI is 40,3

## 2022-03-16 NOTE — Assessment & Plan Note (Signed)
Rate control with amiodarone and anticoagulation with apixaban Continue telemetry monitoring.

## 2022-03-16 NOTE — Evaluation (Addendum)
Physical Therapy Evaluation Patient Details Name: Levi [name] MRN: [id number] DOB: 06/16/1929 Today's Date: 03/16/2022  History of Present Illness  Pt is 88 yo male who presents to [facility] on 03/15/22 from home alone with SOB. PMH: CAD s/p CABG, CHF, COPD, HTN, HLD, morbid obesity, afib, pacer, prostate cancer.  Clinical Impression  Pt presents to PT with limited mobility primarily due to poor functional activity tolerance and generalized weakness. Needs skilled PT to maximize independence and safety. Unsure if pt can manage day to day activities with poor activity tolerance. After PT session pt was able to do very little with OT. Could try SNF for further rehab however pt may opt to go home with family assisting.     Recommendations for follow up therapy are one component of a multi-disciplinary discharge planning process, led by the attending physician.  Recommendations may be updated based on patient status, additional functional criteria and insurance authorization.  Follow Up Recommendations Skilled nursing-short term rehab (<3 hours/day)      Assistance Recommended at Discharge Frequent or constant Supervision/Assistance  Patient can return home with the following  Help with stairs or ramp for entrance;A little help with walking and/or transfers;Assistance with cooking/housework;Assist for transportation    Equipment Recommendations None recommended by PT  Recommendations for Other Services       Functional Status Assessment Patient has had a recent decline in their functional status and demonstrates the ability to make significant improvements in function in a reasonable and predictable amount of time.     Precautions / Restrictions Precautions Precautions: Other (comment) Precaution Comments: watch O2 sats Restrictions Weight Bearing Restrictions: No      Mobility  Bed Mobility Overal bed mobility: Needs Assistance Bed Mobility: Supine to Sit     Supine to sit:  Min assist     General bed mobility comments: Assist to elevate trunk into sitting. Sleeps in lift chair at home    Transfers Overall transfer level: Needs assistance Equipment used: Rolling walker (2 wheels) Transfers: Sit to/from Stand Sit to Stand: Min guard           General transfer comment: Verbal cues for hand placement    Ambulation/Gait Ambulation/Gait assistance: Supervision Gait Distance (Feet): 150 Feet Assistive device: Rolling walker (2 wheels) Gait Pattern/deviations: Step-through pattern, Wide base of support, Decreased stride length Gait velocity: decr Gait velocity interpretation: 1.31 - 2.62 ft/sec, indicative of limited community ambulator   General Gait Details: Assis for safety and lines and verbal cues to pace himself and stop to take rest breaks as needed. Pt took 4 standing rest breaks  Stairs            Wheelchair Mobility    Modified Rankin (Stroke Patients Only)       Balance Overall balance assessment: Needs assistance Sitting-balance support: No upper extremity supported, Feet supported Sitting balance-Leahy Scale: Good     Standing balance support: Bilateral upper extremity supported Standing balance-Leahy Scale: Poor Standing balance comment: walker and supervision for static standing                             Pertinent Vitals/Pain Pain Assessment Pain Assessment: No/denies pain    Home Living Family/patient expects to be discharged to:: Private residence Living Arrangements: Alone Available Help at Discharge: Family;Available 24 hours/day Type of Home: House Home Access: Ramped entrance   Entrance Stairs-Number of Steps: 2   Home Layout: One level Home Equipment: Tub  bench;Rolling Walker (2 wheels);Rollator (4 wheels);Cane - single point;Grab bars - toilet;Grab bars - tub/shower Additional Comments: family can be with him 24/7. Sleeps in lift chair 99% of time. Uses 2 wheel RW in house, cane out of  house    Prior Function Prior Level of Function : Independent/Modified Independent;Driving             Mobility Comments: drives to visit his girlfriend. Uses AD only when he feels he has to ADLs Comments: dons compression stockings independently     Hand Dominance   Dominant Hand: Right    Extremity/Trunk Assessment   Upper Extremity Assessment Upper Extremity Assessment: RUE deficits/detail RUE Deficits / Details: decrease AROM to about 95 degrees shoulder flexion/abduction    Lower Extremity Assessment Lower Extremity Assessment: Defer to PT evaluation    Cervical / Trunk Assessment Cervical / Trunk Assessment: Kyphotic  Communication   Communication: No difficulties  Cognition Arousal/Alertness: Awake/alert Behavior During Therapy: WFL for tasks assessed/performed Overall Cognitive Status: Within Functional Limits for tasks assessed                                          General Comments General comments (skin integrity, edema, etc.): Pt on 3L O2 at rest. SpO2 86% with amb. Incr O2 to 4L. Sitting rest to return O2 to 90% after 1 minute on 4L    Exercises     Assessment/Plan    PT Assessment Patient needs continued PT services  PT Problem List Decreased strength;Decreased activity tolerance;Decreased balance;Decreased mobility;Cardiopulmonary status limiting activity       PT Treatment Interventions DME instruction;Gait training;Functional mobility training;Therapeutic activities;Therapeutic exercise;Balance training;Patient/family education    PT Goals (Current goals can be found in the Care Plan section)  Acute Rehab PT Goals Patient Stated Goal: return home PT Goal Formulation: With patient Time For Goal Achievement: 03/30/22 Potential to Achieve Goals: Good    Frequency Min 3X/week     Co-evaluation               AM-PAC PT "6 Clicks" Mobility  Outcome Measure Help needed turning from your back to your side while in a  flat bed without using bedrails?: A Little Help needed moving from lying on your back to sitting on the side of a flat bed without using bedrails?: A Little Help needed moving to and from a bed to a chair (including a wheelchair)?: A Little Help needed standing up from a chair using your arms (e.g., wheelchair or bedside chair)?: A Little Help needed to walk in hospital room?: A Little Help needed climbing 3-5 steps with a railing? : A Lot 6 Click Score: 17    End of Session Equipment Utilized During Treatment: Gait belt;Oxygen Activity Tolerance: Patient limited by fatigue Patient left: in chair;with call bell/phone within reach;with family/visitor present   PT Visit Diagnosis: Other abnormalities of gait and mobility (R26.89);Muscle weakness (generalized) (M62.81)    Time: 9373-4287 PT Time Calculation (min) (ACUTE ONLY): 20 min   Charges:   PT Evaluation $PT Eval Moderate Complexity: [facility] 03/16/2022, 3:58 PM

## 2022-03-16 NOTE — Assessment & Plan Note (Signed)
Continue with statin therapy.  ?

## 2022-03-16 NOTE — Hospital Course (Signed)
Levi [name] was admitted to the hospital with the working diagnosis of decompensated heart failure  88 yo male with the past medical history of coronary artery disease, sp CABG, heart failure, COPD, hypertension, obesity class 3 and dyslipidemia who presented with dyspnea. Recent hospitalization from 07/03 to 07/08, for heart failure. Reported at home recurrent worsening dyspnea and lower extremity edema. On his initial physical examination his blood pressure was 130/69, HR 70, RR 22 and 02 saturation 93%, lungs with scattered wheezing and decreased breath sounds, increased work of breathing, heart with S1 and S2 present and rhythmic, abdomen not distended and positive lower extremity edema.   Na 138, K 4,1 CL 96, bicarbonate 28, glucose 110 bun 27 cr 1,76  Wbc 10,5 hgb 15.0 plt 263  Sars covid 19 negative   Chest radiograph with cardiomegaly, bilateral interstitial infiltrates predominant lower lobes, with dense infiltrate at the right upper lobe (mid lung). Pacemaker in place with one atrial and one ventricular lead. Sternotomy wires in place.   EKG with 70 bpm, left axis deviation and left bundle branch, atrial sensed and ventricular paced, no significant ST segment or T wave changes.    Patient was placed on furosemide for diuresis.  07/16 continue volume overloaded. 07/17 slowly improving volume status, and decreasing 02 requirements.  07/18 not yet at baseline.

## 2022-03-16 NOTE — Assessment & Plan Note (Addendum)
CKD stage 3b.  Renal function with serum cr at 1,53 K is 3,7 and serum bicarbonate at 29.   Diuresis with furosemide (increase dose to 80 mg q12 hrs), spironolactone and SGLT 2 inh.  Follow up renal function in am, avoid hypotension and nephrotoxic medications.   Continue Kcl supplementation.

## 2022-03-16 NOTE — Consult Note (Signed)
Palliative Medicine Inpatient Consult Note  Consulting Provider: Karmen Bongo, MD  Reason for consult:   Levi Levi [name] Palliative Medicine Consult  Reason for Consult? goals of care   03/16/2022  HPI:  Per intake H&P --> Levi Levi [name] is a 88 y.o. male with medical history significant of CAD s/p CABG; chronic combined CHF; COPD; HTN; HLD; morbid obesity; afib; pacemaker placement; and prostate CA presenting with SOB.  He was last admitted from 7/3-8 with acute on chronic combined CHF, treated with Lasix and spironolactone and started on SGLT2 inhibitor.  OSA/OHS were thought to be contributors and he was discharged on home O2.    Palliative care has been consulted to have a goals of care conversation in the setting of multiple chronic co-morbid conditions.   Clinical Assessment/Goals of Care:  *Please note that this is a verbal dictation therefore any spelling or grammatical errors are due to the "[employer]" system interpretation.  I have reviewed medical records including EPIC notes, labs and imaging, received report from bedside RN, assessed the patient.    I met with Levi Levi [name] this morning he was joined by his son and daughter at bedside to further discuss diagnosis prognosis, GOC, EOL wishes, disposition and options.   I introduced Palliative Medicine as specialized medical care for people living with serious illness. It focuses on providing relief from the symptoms and stress of a serious illness. The goal is to improve quality of life for both the patient and the family.  Medical History Review and Understanding:  Type this and I reviewed his past medical history of coronary artery disease requiring multiple bypass procedures, heart failure, COPD for which she is on 3 L of facemask oxygen daily, of sleep Levi Levi [name] sleep apnea for which he is on CPAP in the evenings.  Social History:  Levi Levi [name] is from [place], [place].  He is a widower  though he had been married for 17 years.  His wife passed away 5 years ago.  He now has a girlfriend, Doris who he has been seeing for the last 2 years.  He has 4 children, 7 grandchildren, and 4 great-grandchildren.  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  He shares that he is a man of faith and practices within the [employer] denomination  Functional and Nutritional State:  Levi Levi [name] lives independently in his home.  His children take shifts to visit with him and make sure that he is doing well.  Levi Levi [name] is able to perform all B ADLs independently and remains to be driving.  From the perspective of mobility aids Levi Levi [name] uses a front wheel walker.  Levi Levi [name] has a robust appetite this has not diminished recently  Palliative Symptoms:  Levi Levi [name]'s main complaint is his ongoing shortness of breath which is present always.  He shares that he was hopeful with cardiovascular procedures that would improve though it has not.  He shares that it takes about 2 hours to perform his basic activities in the morning.  He moves slowly though is able to still function without assistance.  Advance Directives:  A detailed discussion was had today regarding advanced directives.  Levi Levi [name] has no advanced directives though he shares that his children do not know what his wishes are  Code Status:  Encouraged Levi [name] to consider DNR/DNI status understanding evidenced based poor outcomes in similar hospitalized patient, as  the cause of arrest is likely associated with advanced chronic/terminal illness rather than an easily reversible acute cardio-pulmonary event. I explained that DNR/DNI does not change the medical plan and it only comes into effect after a person has arrested (died).  It is a protective measure to keep [place] from harming the patient in their last moments of life.  Levi Levi [name] was agreeable to DNR/DNI CODE  STATUS.  A copy of a MOST form and the "hard choices for loving people" booklet was provided to patient's daughter.  She shares that she would prefer if we take baby steps towards completion of the MOST given the context of the above conversation.  Discussion:  We discussed Dyllon's present admission for shortness of breath.  Reviewed the reasons why he may be feeling this way and what has been tried in the past.  Kyi shares that he is hopeful he will be able to discharge sooner than later.  We discussed that given his recurrent rehospitalization's and chronic comorbid conditions it would be beneficial to have outpatient palliative support follow along with him.  We reviewed that palliative support can be administered with any and all curative treatments inclusive of physical and occupational therapies.  I shared that none of that would be hindered and the palliative care as an outpatient would offer an extra layer of support for Levi Levi [name] and his family as he progresses through his chronic disease processes.  Patient's daughter was agreeable to this.  Discussed the importance of continued conversation with family and their  medical providers regarding overall plan of care and treatment options, ensuring decisions are within the context of the patients values and GOCs.  Decision Maker: Levi Levi [name] at this time can make decisions for himself though he has 4 children who are all actively involved in his medical care.  SUMMARY OF RECOMMENDATIONS   DNAR/DNI  Goal DNR provided to patient's family  Patient expresses that his goals are to remain independent and for improvement  We will request outpatient palliative support on discharge  Palliative care will incrementally follow [place] during admission given the clarity of goals  Code Status/Advance Care Planning: DNAR/DNI  Palliative Prophylaxis:  Aspiration, Bowel Regimen, Delirium Protocol, Frequent Pain Assessment, Oral Care, Palliative Wound  Care, and Turn Reposition  Additional Recommendations (Limitations, Scope, Preferences): Continue current care plan  Psycho-social/Spiritual:  Desire for further Chaplaincy support: Yes-patient is [employer] Additional Recommendations: Reviewed progressive chronic disease conditions   Prognosis: Patient has had 2 hospitalizations within 6 months, has multiple chronic disease processes, has an increased 68-monthmortality risk  Discharge Planning: Discharge to home with home health PT/OT/outpatient palliative care once medically optimized by the hospitalist service Vitals:   03/15/22 2346 03/16/22 0328  BP: (!) 157/68 (!) 122/55  Pulse: 77 77  Resp: 20 19  Temp:  97.6 F (36.4 C)  SpO2: 90% 91%    Intake/Output Summary (Last 24 hours) at 03/16/2022 04270Last data filed at 03/16/2022 06237Gross per 24 hour  Intake 240 ml  Output 850 ml  Net -610 ml   Last Weight  Most recent update: 03/15/2022  8:19 PM    Weight  124.2 kg (273 lb 13 oz)            Gen: Elderly Caucasian male in no acute distress HEENT: moist mucous membranes CV: Regular rate and rhythm  PULM: On 3 L nasal cannula breathing is even and nonlabored ABD: soft/nontender  EXT: (+)pedal edema and venous stasis dermatitis Neuro: Alert and oriented x3  PPS: 60%   This conversation/these recommendations were discussed with patient primary care team, Dr. Cathlean Sauer  Billing based on MDM: High  Problems Addressed: One acute or chronic illness or injury that poses a threat to life or bodily function  Amount and/or Complexity of Data: Category 3:Discussion of management or test interpretation with external physician/other qualified health care professional/appropriate source (not separately reported)  Risks: Decision not to resuscitate or to de-escalate care because of poor prognosis ______________________________________________________ [facility] Team Team Cell Phone:  [phone] Please utilize secure chat with additional questions, if there is no response within 30 minutes please call the above phone number  Palliative Medicine Team providers are available by phone from 7am to 7pm daily and can be reached through the team cell phone.  Should this patient require assistance outside of these hours, please call the patient's attending physician.

## 2022-03-16 NOTE — Assessment & Plan Note (Signed)
Echocardiogram from 07/04 with preserved LV systolic function with EF 50 to 55%, RV with preserved systolic function, mild enlargement of RV cavity. Severe TR.   Documented urine output is 546 ml  Systolic blood pressure 503 to 140 mmHg.  Plan to continue diuresis with furosemide, will increase dose to 60 mg IV q12  After load reduction with hydralazine 50 mg bid, and continue with spironolactone.  Resume empagliflozin,

## 2022-03-16 NOTE — Progress Notes (Signed)
Progress Note   Patient: Levi [name] HFW:[id number] DOB: 02-Sep-1929 DOA: 03/15/2022     0 DOS: the patient was seen and examined on 03/16/2022   Brief hospital course: Mr. Ky was admitted to the hospital with the working diagnosis of decompensated heart failure  88 yo male with the past medical history of coronary artery disease, sp CABG, heart failure, COPD, hypertension, obesity class 3 and dyslipidemia who presented with dyspnea. Recent hospitalization from 07/03 to 07/08, for heart failure. Reported at home recurrent worsening dyspnea and lower extremity edema. On his initial physical examination his blood pressure was 130/69, HR 70, RR 22 and 02 saturation 93%, lungs with scattered wheezing and decreased breath sounds, increased work of breathing, heart with S1 and S2 present and rhythmic, abdomen not distended and positive lower extremity edema.   Na 138, K 4,1 CL 96, bicarbonate 28, glucose 110 bun 27 cr 1,76  Wbc 10,5 hgb 15.0 plt 263  Sars covid 19 negative   Chest radiograph with cardiomegaly, bilateral interstitial infiltrates predominant lower lobes, with dense infiltrate at the right upper lobe (mid lung). Pacemaker in place with one atrial and one ventricular lead. Sternotomy wires in place.   EKG with 70 bpm, left axis deviation and left bundle branch, atrial sensed and ventricular paced, no significant ST segment or T wave changes.       Assessment and Plan: Acute on chronic diastolic CHF (congestive heart failure) (HCC) Echocardiogram from 07/04 with preserved LV systolic function with EF 50 to 55%, RV with preserved systolic function, mild enlargement of RV cavity. Severe TR.   Documented urine output is 027 ml  Systolic blood pressure 741 to 140 mmHg.  Plan to continue diuresis with furosemide, will increase dose to 60 mg IV q12  After load reduction with hydralazine 50 mg bid, and continue with spironolactone.  Resume empagliflozin,   Essential  hypertension Continue blood pressure control with hydralazine and continue diuresis with furosemide, empagliflozin and spironolactone.   Acute kidney injury superimposed on chronic kidney disease (HCC) CKD stage 3b.   Renal function with serum cr at 1.93 with K at 4,5 and serum bicarbonate at 29. Continue diuresis and follow up renal function in am, avoid hypotension and nephrotoxic medications.   PAF (paroxysmal atrial fibrillation) (HCC) Rate control with amiodarone and anticoagulation with apixaban Continue telemetry monitoring.   HLD (hyperlipidemia) Continue with statin therapy.   Hypothyroidism On levothyroxine   Class 3 obesity (HCC) Calculated BMI is 40,3         Subjective: Patient continue with dyspnea and edema no chest pain, not back to his baseline   Physical Exam: Vitals:   03/15/22 2346 03/16/22 0328 03/16/22 0819 03/16/22 1133  BP: (!) 157/68 (!) 122/55 128/62 (!) 149/65  Pulse: 77 77 73 73  Resp: 20 19 20 18   Temp:  97.6 F (36.4 C) 98.2 F (36.8 C)   TempSrc:  Oral Oral   SpO2: 90% 91% 92% 96%  Weight:      Height:       Neurology awake and alert ENT WIth no pallor Cardiovascular with S1 and S2 present and rhythmic with no gallops rubs, positive murmur at the right sternal border, systolic 3/6  Mild JVD Positive lower extremity edema ++  Respiratory with rales bilaterally and expiratory wheezing Abdomen not distended  Data Reviewed:    Family Communication: I spoke with patient's grandson  at the bedside, we talked in detail about patient's condition, plan of care and prognosis  and all questions were addressed.   Disposition: Status is: Inpatient Remains inpatient appropriate because: heart failure   Planned Discharge Destination: Home      Author: Tawni Millers, MD 03/16/2022 3:07 PM  For on call review [email or web address].

## 2022-03-16 NOTE — Evaluation (Signed)
Occupational Therapy Evaluation Patient Details Name: Levi [name] MRN: [id number] DOB: 1929-04-16 Today's Date: 03/16/2022   History of Present Illness Pt is 88 yo male who presents to [facility] on 03/15/22 from home alone with SOB. PMH: CAD s/p CABG, CHF, COPD, HTN, HLD, morbid obesity, afib, pacer, prostate cancer.   Clinical Impression   Pt at this time limited as only able to stand with BUE for 30 seconds max and needed increase in time while sitting to breath while making simple conversation. Pt required to go up to 4L when attempting to go from chair to bed to be able to remain above 90% o2 reading but was placed back on3L post activity. Pt was able to complete LB ADLs with  min assist. Pt currently with functional limitations due to the deficits listed below (see OT Problem List).  Pt will benefit from skilled OT to increase their safety and independence with ADL and functional mobility for ADL to facilitate discharge to venue listed below.        Recommendations for follow up therapy are one component of a multi-disciplinary discharge planning process, led by the attending physician.  Recommendations may be updated based on patient status, additional functional criteria and insurance authorization.   Follow Up Recommendations  Skilled nursing-short term rehab (<3 hours/day) (however, if declines HH with family support and HH aide)    Assistance Recommended at Discharge Frequent or constant Supervision/Assistance  Patient can return home with the following      Functional Status Assessment  Patient has had a recent decline in their functional status and demonstrates the ability to make significant improvements in function in a reasonable and predictable amount of time.  Equipment Recommendations  None recommended by OT    Recommendations for Other Services       Precautions / Restrictions Precautions Precautions: Other (comment) Precaution Comments: watch O2  sats Restrictions Weight Bearing Restrictions: No      Mobility Bed Mobility Overal bed mobility: Needs Assistance Bed Mobility: Sit to Supine Rolling: Modified independent (Device/Increase time)              Transfers Overall transfer level: Needs assistance Equipment used: Rolling walker (2 wheels) Transfers: Sit to/from Stand Sit to Stand: Min guard                  Balance Overall balance assessment: Needs assistance Sitting-balance support: No upper extremity supported, Feet supported Sitting balance-Leahy Scale: Good     Standing balance support: Bilateral upper extremity supported, No upper extremity supported Standing balance-Leahy Scale: Fair                             ADL either performed or assessed with clinical judgement   ADL Overall ADL's : Needs assistance/impaired Eating/Feeding: Set up;Sitting   Grooming: Wash/dry hands;Wash/dry face;Set up;Sitting   Upper Body Bathing: Set up;Sitting   Lower Body Bathing: Minimal assistance;Cueing for safety;Cueing for sequencing;Sit to/from stand   Upper Body Dressing : Set up;Cueing for safety;Cueing for sequencing;Sitting   Lower Body Dressing: Minimal assistance;Cueing for safety;Cueing for sequencing;Sit to/from stand   Toilet Transfer: Min guard;Cueing for safety;Cueing for sequencing;Rolling walker (2 wheels)           Functional mobility during ADLs: Min guard;Rolling walker (2 wheels) General ADL Comments: Pt only tolerated 30 seconds in standing     Vision Baseline Vision/History: 1 Wears glasses Ability to See in Adequate Light: 0 Adequate Patient  Visual Report: No change from baseline Vision Assessment?: No apparent visual deficits     Perception     Praxis      Pertinent Vitals/Pain Pain Assessment Pain Assessment: No/denies pain     Hand Dominance Right   Extremity/Trunk Assessment Upper Extremity Assessment Upper Extremity Assessment: RUE  deficits/detail RUE Deficits / Details: decrease AROM to about 95 degrees shoulder flexion/abduction   Lower Extremity Assessment Lower Extremity Assessment: Defer to PT evaluation   Cervical / Trunk Assessment Cervical / Trunk Assessment: Kyphotic   Communication Communication Communication: No difficulties   Cognition Arousal/Alertness: Awake/alert Behavior During Therapy: WFL for tasks assessed/performed Overall Cognitive Status: Within Functional Limits for tasks assessed                                       General Comments  3L even with talking became out of breath and when just sitting to standing went to 88% and then with transfer to bed required to go on 4L    Exercises     Shoulder Instructions      Home Living Family/patient expects to be discharged to:: Private residence Living Arrangements: Alone Available Help at Discharge: Family;Available 24 hours/day Type of Home: House Home Access: Ramped entrance Entrance Stairs-Number of Steps: 2   Home Layout: One level     Bathroom Shower/Tub: Teacher, early years/pre: Handicapped height Bathroom Accessibility: Yes   Home Equipment: Tub bench;Rolling Walker (2 wheels);Rollator (4 wheels);Cane - single point;Grab bars - toilet;Grab bars - tub/shower   Additional Comments: family can be with him 24/7. Sleeps in lift chair 99% of time. Uses 2 wheel RW in house, cane out of house      Prior Functioning/Environment Prior Level of Function : Independent/Modified Independent;Driving             Mobility Comments: drives to visit his girlfriend. Uses AD only when he feels he has to ADLs Comments: dons compression stockings independently        OT Problem List: Decreased activity tolerance;Impaired balance (sitting and/or standing);Decreased safety awareness;Decreased knowledge of use of DME or AE;Cardiopulmonary status limiting activity      OT Treatment/Interventions:  Self-care/ADL training;DME and/or AE instruction;Therapeutic activities;Patient/family education;Balance training    OT Goals(Current goals can be found in the care plan section) Acute Rehab OT Goals Patient Stated Goal: to get rest OT Goal Formulation: With patient Time For Goal Achievement: 03/30/22 Potential to Achieve Goals: Good ADL Goals Pt Will Perform Grooming: with modified independence;standing Pt Will Perform Upper Body Bathing: Independently;sitting Pt Will Perform Lower Body Bathing: with set-up;sit to/from stand Pt Will Transfer to Toilet: with modified independence;ambulating  OT Frequency: Min 2X/week    Co-evaluation              AM-PAC OT "6 Clicks" Daily Activity     Outcome Measure Help from another person eating meals?: None Help from another person taking care of personal grooming?: None Help from another person toileting, which includes using toliet, bedpan, or urinal?: A Little Help from another person bathing (including washing, rinsing, drying)?: A Lot Help from another person to put on and taking off regular upper body clothing?: A Little Help from another person to put on and taking off regular lower body clothing?: A Little 6 Click Score: 19   End of Session Equipment Utilized During Treatment: Rolling walker (2 wheels) Nurse Communication: Mobility status  Activity Tolerance: Patient tolerated treatment well Patient left: in bed;with call bell/phone within reach;with family/visitor present;with bed alarm set  OT Visit Diagnosis: Unsteadiness on feet (R26.81);Other abnormalities of gait and mobility (R26.89)                Time: 1410-1443 OT Time Calculation (min): 33 min Charges:  OT General Charges $OT Visit: 1 Visit OT Evaluation $OT Eval Low Complexity: 1 Low OT Treatments $Self Care/Home Management : 8-22 mins  .kk  Joeseph Amor 03/16/2022, 4:10 PM

## 2022-03-16 NOTE — Social Work (Signed)
CSW acknowledges consult for SNF/HH. The patient will require PT/OT evaluations. TOC will assist with disposition planning once the evaluations have been completed.  °  °TOC will continue to follow.    °

## 2022-03-16 NOTE — Assessment & Plan Note (Signed)
On levothyroxine  ?

## 2022-03-16 NOTE — Assessment & Plan Note (Signed)
Continue blood pressure control with hydralazine and continue diuresis with furosemide, empagliflozin and spironolactone.

## 2022-03-17 DIAGNOSIS — N179 Acute kidney failure, unspecified: Secondary | ICD-10-CM | POA: Diagnosis not present

## 2022-03-17 DIAGNOSIS — I5033 Acute on chronic diastolic (congestive) heart failure: Secondary | ICD-10-CM | POA: Diagnosis not present

## 2022-03-17 DIAGNOSIS — I10 Essential (primary) hypertension: Secondary | ICD-10-CM | POA: Diagnosis not present

## 2022-03-17 DIAGNOSIS — I480 Paroxysmal atrial fibrillation: Secondary | ICD-10-CM | POA: Diagnosis not present

## 2022-03-17 DIAGNOSIS — Z515 Encounter for palliative care: Secondary | ICD-10-CM | POA: Diagnosis not present

## 2022-03-17 LAB — BASIC METABOLIC PANEL
Anion gap: 10 (ref 5–15)
BUN: 37 mg/dL — ABNORMAL HIGH (ref 8–23)
CO2: 27 mmol/L (ref 22–32)
Calcium: 8.7 mg/dL — ABNORMAL LOW (ref 8.9–10.3)
Chloride: 98 mmol/L (ref 98–111)
Creatinine, Ser: 1.61 mg/dL — ABNORMAL HIGH (ref 0.61–1.24)
GFR, Estimated: 40 mL/min — ABNORMAL LOW (ref >60)
Glucose, Bld: 97 mg/dL (ref 70–99)
Potassium: 3.7 mmol/L (ref 3.5–5.1)
Sodium: 135 mmol/L (ref 135–145)

## 2022-03-17 LAB — MAGNESIUM: Magnesium: 2.8 mg/dL — ABNORMAL HIGH (ref 1.7–2.4)

## 2022-03-17 NOTE — Progress Notes (Signed)
   Palliative Medicine Inpatient Follow Up Note   HPI: Levi [name] is a 88 y.o. male with medical history significant of CAD s/p CABG; chronic combined CHF; COPD; HTN; HLD; morbid obesity; afib; pacemaker placement; and prostate CA presenting with SOB.  He was last admitted from 7/3-8 with acute on chronic combined CHF, treated with Lasix and spironolactone and started on SGLT2 inhibitor.  OSA/OHS were thought to be contributors and he was discharged on home O2.     Palliative care has been consulted to have a goals of care conversation in the setting of multiple chronic co-morbid conditions.   Today's Discussion 03/17/2022  *Please note that this is a verbal dictation therefore any spelling or grammatical errors are due to the "[place] [employer]" system interpretation.  Chart reviewed inclusive of vital signs, progress notes, laboratory results, and diagnostic images.   I met with Levi [name] at bedside this afternoon. He was resting comfortably in bed. We reviewed that he is hopeful to discharge home sooner than later. He shares that he feels his breathing is, for the most part, back to normal. Created space and opportunity for patient to explore thoughts feelings and fears regarding current medical situation. He shares that he continues to hope for improvements.   Questions and concerns addressed/ Palliative Support Provided _______________________ Per secure chat with MD Arrien plan for a few more days of diuresis.   Objective Assessment: Vital Signs Vitals:   03/17/22 0820 03/17/22 0934  BP: (!) 142/60 (!) 144/62  Pulse: 74 72  Resp: (!) 21 (!) 23  Temp: 98.2 F (36.8 C)   SpO2: 93% 94%    Intake/Output Summary (Last 24 hours) at 03/17/2022 1151 Last data filed at 03/17/2022 0800 Gross per 24 hour  Intake 600 ml  Output 2300 ml  Net -1700 ml   Last Weight  Most recent update: 03/17/2022  4:36 AM    Weight  125.2 kg (276 lb 0.3 oz)            Gen: Elderly  Caucasian male in no acute distress HEENT: moist mucous membranes CV: Regular rate and rhythm  PULM: On 3 L nasal cannula breathing is even and nonlabored ABD: soft/nontender  EXT: (+)pedal edema and venous stasis dermatitis Neuro: Alert and oriented x3  SUMMARY OF RECOMMENDATIONS   DNAR/DNI   Goal DNR provided to patient's family   Patient expresses that his goals are to remain independent and for improvement   We will request outpatient palliative support on discharge   Palliative care will incrementally follow Levi [name] during admission given the clarity of goals  Billing based on MDM: Moderate  ______________________________________________________________________________________ [place] Team Team Cell Phone: [phone] Please utilize secure chat with additional questions, if there is no response within 30 minutes please call the above phone number  Palliative Medicine Team providers are available by phone from 7am to 7pm daily and can be reached through the team cell phone.  Should this patient require assistance outside of these hours, please call the patient's attending physician.

## 2022-03-17 NOTE — Progress Notes (Signed)
RT at bedside to assess pt. RT found pt on 3L simple mask pt brought from home. Pt refusing to wear [place] and wants to wear simple mask. Pt  states its what he uses at home. RT increased flow to 5L due to pt using simple mask. Pt educated about the need to increase flow in order to adequately utilize O2 delivery device. RT will monitor.   03/17/22 1327  Oxygen Therapy/Pulse Ox  O2 Device Simple Mask  O2 Flow Rate (L/min) (S)  5 L/min (due to O2 delivery device 5L is needed)  SpO2 94 %

## 2022-03-17 NOTE — TOC Initial Note (Addendum)
Transition of Care [facility]) - Initial/Assessment Note    Patient Details  Name: Levi [name] MRN: [id number] Date of Birth: March 04, 1929  [facility] CM/SW Contact:    Carles Collet, RN Phone Number: 03/17/2022, 11:46 AM  Clinical Narrative:                Damaris Schooner w patient and son Louie Casa at bedside. Discussed recs for SNF (patient has qualifying stay from 7/3 to 7/8). They decline SNF and would like to continue with [facility]. [place] liaison notified of admission and following in [place].  Son states that amongst family they will provide 24/7 supervision and assist.  Patient has oxygen at home and for transport, his daughter [name] states that she plans to transport by car home.  They would like palliative services, and chose [facility]. Referral made to Jhonnie Garner RN at [facility].   [name] asks we continue to use her or her sister for DC planning point person, son Louie Casa agrees.    TOC will continue to follow  Expected Discharge Plan: [place] Barriers to Discharge: Continued Medical Work up   Patient Goals and CMS Choice Patient states their goals for this hospitalization and ongoing recovery are:: return home CMS Medicare.gov Compare Post Acute Care list provided to:: Patient Choice offered to / list presented to : Patient, Adult Children  Expected Discharge Plan and Services Expected Discharge Plan: [place]   Discharge Planning Services: CM Consult Post Acute Care Choice: [place] arrangements for the past 2 months: [place]: [employer] Date [facility] Agency Contacted: 03/17/22 Time HH Agency Contacted: 8 Representative spoke with at [place]  Prior Living Arrangements/Services Living arrangements for the past 2 months: [place] with:: Self   Do you feel safe going back to the place where you live?: Yes               Activities  of Daily Living Home Assistive Devices/Equipment: CPAP, Oxygen, Walker (specify type) ADL Screening (condition at time of admission) Patient's cognitive ability adequate to safely complete daily activities?: Yes Is the patient deaf or have difficulty hearing?: Yes Does the patient have difficulty seeing, even when wearing glasses/contacts?: No Does the patient have difficulty concentrating, remembering, or making decisions?: No Patient able to express need for assistance with ADLs?: Yes Does the patient have difficulty dressing or bathing?: Yes Independently performs ADLs?: Yes (appropriate for developmental age) Does the patient have difficulty walking or climbing stairs?: No Weakness of Legs: Both Weakness of Arms/Hands: None  Permission Sought/Granted                  Emotional Assessment              Admission diagnosis:  Dyspnea [R06.00] Dyspnea, unspecified type [R06.00] Congestive heart failure, unspecified HF chronicity, unspecified heart failure type ([place]) [I50.9] Heart failure ([place]) [I50.9] Patient Active Problem List   Diagnosis Date Noted   Class 3 obesity ([place]) 03/16/2022   Hypothyroidism 03/05/2022   Acute on chronic combined systolic and diastolic CHF (congestive heart failure) ([place]) 03/04/2022   Acute kidney injury superimposed on CKD ([place]) 03/14/2020   Hypokalemia 03/14/2020   Dizziness 03/17/2019   Acute on chronic combined systolic (congestive) and diastolic (congestive) heart  failure ([place]) 03/15/2019   Acute kidney injury superimposed on chronic kidney disease ([place]) 10/09/2018   Ischemic cardiomyopathy 10/06/2018   Orthostatic hypotension 08/14/2017   Acute on chronic diastolic CHF (congestive heart failure) ([place]) 06/09/2017   Obesity, Class III, BMI 40-49.9 (morbid obesity) ([place]) 02/19/2017   OSA (obstructive sleep apnea) 02/19/2017   Decreased hearing of both ears 12/24/2016   Depression, reactive 12/06/2016   [place] type 2 second degree  atrioventricular block 10/05/2015   Chronic anticoagulation 09/19/2015   Bifascicular block 09/19/2015   Bradycardia 09/19/2015   Syncope 06/30/2014   Lumbosacral spondylosis 02/21/2014   Personal history of nicotine dependence 10/29/2013   Personal history of tobacco use, presenting hazards to health 10/29/2013   H/O coronary artery bypass surgery 10/22/2012   Angina, class III ([place]) 10/15/2012   CA of prostate ([place]) 07/09/2012   Gastro-esophageal reflux disease with esophagitis 07/09/2012   Allergic rhinitis 06/15/2012   Back pain 10/24/2011   CAD (coronary artery disease) 06/05/2011   Chronic coronary artery disease 06/05/2011   PAF (paroxysmal atrial fibrillation) ([place]) 09/20/2010   PROSTATE CANCER 12/27/2009   ADENOMATOUS COLONIC POLYP 12/27/2009   HLD (hyperlipidemia) 12/27/2009   Essential hypertension 12/27/2009   EMPHYSEMA 12/27/2009   OSTEOPENIA 12/27/2009   MALAISE AND FATIGUE 12/27/2009   Heart failure with preserved ejection fraction ([place]) 12/27/2009   CHEST PAIN UNSPECIFIED 12/27/2009   History of colonic polyps 12/04/2009   PCP:  Bernerd Limbo, MD Pharmacy:   [place], [address] [place] [address] Phone: [phone] Fax: [phone]  [place], [address] [place] [address] Phone: [phone] Fax: [phone]  [facility] ([facility] ) - [place], [address] [place] [address] Phone: [phone] Fax: [phone]     Social Determinants of Health (SDOH) Interventions    Readmission Risk Interventions     No data to display

## 2022-03-17 NOTE — Progress Notes (Signed)
Progress Note   Patient: Levi [name] WJX:[id number] DOB: 1929/02/28 DOA: 03/15/2022     1 DOS: the patient was seen and examined on 03/17/2022   Brief hospital course: Levi [name] was admitted to the hospital with the working diagnosis of decompensated heart failure  88 yo male with the past medical history of coronary artery disease, sp CABG, heart failure, COPD, hypertension, obesity class 3 and dyslipidemia who presented with dyspnea. Recent hospitalization from 07/03 to 07/08, for heart failure. Reported at home recurrent worsening dyspnea and lower extremity edema. On his initial physical examination his blood pressure was 130/69, HR 70, RR 22 and 02 saturation 93%, lungs with scattered wheezing and decreased breath sounds, increased work of breathing, heart with S1 and S2 present and rhythmic, abdomen not distended and positive lower extremity edema.   Na 138, K 4,1 CL 96, bicarbonate 28, glucose 110 bun 27 cr 1,76  Wbc 10,5 hgb 15.0 plt 263  Sars covid 19 negative   Chest radiograph with cardiomegaly, bilateral interstitial infiltrates predominant lower lobes, with dense infiltrate at the right upper lobe (mid lung). Pacemaker in place with one atrial and one ventricular lead. Sternotomy wires in place.   EKG with 70 bpm, left axis deviation and left bundle branch, atrial sensed and ventricular paced, no significant ST segment or T wave changes.    Patient was placed on furosemide for diuresis.  07/16 continue volume overloaded.   Assessment and Plan: Acute on chronic diastolic CHF (congestive heart failure) (HCC) Echocardiogram from 07/04 with preserved LV systolic function with EF 50 to 55%, RV with preserved systolic function, mild enlargement of RV cavity. Severe TR.   Documented urine output is 2,130 ml  Systolic blood pressure 865 to 150 mmHg. Continue with clinically hypervolemia.   Continue diuresis with furosemide 60 mg IV q12, spironolactone and SGLT 2 inh.    Afterload reduction with hydralazine 50 mg bid.  Essential hypertension Continue blood pressure control with hydralazine and continue diuresis with furosemide, empagliflozin and spironolactone.   Acute kidney injury superimposed on chronic kidney disease (HCC) CKD stage 3b.   Improved diuresis but continue volume overloaded.  Renal function with serum cr at 1,61 with K at 3,7 and serum bicarbonate at 27.  Plan to continue diuresis with furosemide, spironolactone and SGLT 2 inh.  Follow up renal function in am, avoid hypotension and nephrotoxic medications.   PAF (paroxysmal atrial fibrillation) (HCC) Rate control with amiodarone and anticoagulation with apixaban Continue telemetry monitoring.   HLD (hyperlipidemia) Continue with statin therapy.   Hypothyroidism On levothyroxine   Class 3 obesity (HCC) Calculated BMI is 40,3         Subjective: Patient continue to have dyspnea at rest, he is anxious to go home,. No chest pain.   Physical Exam: Vitals:   03/17/22 0820 03/17/22 0934 03/17/22 1240 03/17/22 1327  BP: (!) 142/60 (!) 144/62 (!) 153/65   Pulse: 74 72 71   Resp: (!) 21 (!) 23 (!) 26   Temp: 98.2 F (36.8 C)     TempSrc: Oral Oral    SpO2: 93% 94% 93% 94%  Weight:      Height:       Neurology awake and alert ENT with mild pallor Cardiovascular with S1 and S2 present and regular with no gallops or rubs Moderate JVD Positive lower extremity edema ++ Respiratory with bilateral rales but not wheezing Abdomen not distended  Data Reviewed:    Family Communication: I spoke with patient's family  at the bedside, we talked in detail about patient's condition, plan of care and prognosis and all questions were addressed.   Disposition: Status is: Inpatient Remains inpatient appropriate because: heart failure and volume overload   Planned Discharge Destination: Home      Author: Tawni Millers, MD 03/17/2022 2:08 PM  For on call review  [email or web address].

## 2022-03-18 ENCOUNTER — Encounter (HOSPITAL_COMMUNITY): Payer: Medicare Other

## 2022-03-18 ENCOUNTER — Ambulatory Visit: Payer: Medicare Other | Admitting: Physician Assistant

## 2022-03-18 DIAGNOSIS — I10 Essential (primary) hypertension: Secondary | ICD-10-CM | POA: Diagnosis not present

## 2022-03-18 DIAGNOSIS — I480 Paroxysmal atrial fibrillation: Secondary | ICD-10-CM | POA: Diagnosis not present

## 2022-03-18 DIAGNOSIS — N179 Acute kidney failure, unspecified: Secondary | ICD-10-CM | POA: Diagnosis not present

## 2022-03-18 DIAGNOSIS — I5033 Acute on chronic diastolic (congestive) heart failure: Secondary | ICD-10-CM | POA: Diagnosis not present

## 2022-03-18 LAB — BASIC METABOLIC PANEL
Anion gap: 12 (ref 5–15)
BUN: 41 mg/dL — ABNORMAL HIGH (ref 8–23)
CO2: 29 mmol/L (ref 22–32)
Calcium: 8.7 mg/dL — ABNORMAL LOW (ref 8.9–10.3)
Chloride: 98 mmol/L (ref 98–111)
Creatinine, Ser: 1.86 mg/dL — ABNORMAL HIGH (ref 0.61–1.24)
GFR, Estimated: 34 mL/min — ABNORMAL LOW (ref >60)
Glucose, Bld: 102 mg/dL — ABNORMAL HIGH (ref 70–99)
Potassium: 3.6 mmol/L (ref 3.5–5.1)
Sodium: 139 mmol/L (ref 135–145)

## 2022-03-18 MED ORDER — POTASSIUM CHLORIDE CRYS ER 20 MEQ PO TBCR
20.0000 meq | EXTENDED_RELEASE_TABLET | Freq: Once | ORAL | Status: AC
  Administered 2022-03-18: 20 meq via ORAL
  Filled 2022-03-18: qty 1

## 2022-03-18 NOTE — Progress Notes (Addendum)
Heart Failure Nurse Navigator Progress Note  PCP: Bernerd Limbo, MD PCP-Cardiologist: Harrington Challenger Admission Diagnosis: None listed Admitted from: Home via EMS  Presentation:   Levi [name] presented with shortness of breath, cough for a couple days, was recently discharged from the hospital 6 days prior to admission. States his weight has gone up,for the last couple days. BP 139/57, HR 73, on 3 L [place], his baseline. Mild bilateral lower extremity edema.Patient lives next door to his daughter at this time, SNF is being recommended , however patient has refused.   Patient and girlfriend were educated on the sign and symptoms of heart failure, daily weights, when to call his doctor or go to the ER. Diet/ fluid restrictions, taking all his medications as prescribed and attending all medical appointments. Patient and his girl friend verbalized their understanding of the education and are is scheduled for a Hospital HF TOC follow up on 04/02/2022 @ 11am.   ECHO/ LVEF: 50-55% HFpEF   Clinical Course:  Past Medical History:  Diagnosis Date   Asthma    Benign neoplasm of colon    CAD (coronary artery disease) Dec 2011   s/p CABG per Dr. Roxy Manns; had normal EF; All SVGs occluded per follow up cath with only LIMA to LAD patent; s/p PCI of the proximal and mid LAD February 2014 per Dr. Burt Knack   Chronic combined systolic and diastolic CHF (congestive heart failure) ([place])    CKD (chronic kidney disease), stage II    Emphysema    "never treated for it" (10/05/2015)   History of hiatal hernia    HLD (hyperlipidemia)    HTN (hypertension)    Malaise and fatigue    Obesity    Osteopenia    PAF (paroxysmal atrial fibrillation) (HCC)    Pneumonia 1960s X 1; 1990s X 1   Presence of permanent cardiac pacemaker    Prostate cancer ([place])    Skin cancer    "face, nose, top of ears, scalp"   Symptomatic bradycardia    STJ PPM, 10/05/15, Dr. Lovena Le     Social History   Socioeconomic History   Marital status:  Widowed    Spouse name: Not on file   Number of children: 4   Years of education: Not on file   Highest education level: 9th grade  Occupational History   Occupation: retired Information systems manager: RETIRED  Tobacco Use   Smoking status: Former    Packs/day: 1.00    Years: 30.00    Total pack years: 30.00    Types: Cigarettes    Quit date: 09/07/1975    Years since quitting: 46.5   Smokeless tobacco: Former    Types: Chew    Quit date: 07/27/2015  Vaping Use   Vaping Use: Never used  Substance and Sexual Activity   Alcohol use: No    Alcohol/week: 0.0 standard drinks of alcohol   Drug use: No   Sexual activity: Not Currently  Other Topics Concern   Not on file  Social History Narrative   Not on file   Social Determinants of Health   Financial Resource Strain: Low Risk  (03/07/2022)   Overall Financial Resource Strain (CARDIA)    Difficulty of Paying Living Expenses: Not hard at all  Food Insecurity: No Food Insecurity (03/18/2022)   Hunger Vital Sign    Worried About Running Out of Food in the Last Year: Never true    [place] in the Last Year: Never true  Transportation Needs: No Transportation Needs (03/18/2022)   PRAPARE - Hydrologist (Medical): No    Lack of Transportation (Non-Medical): No  Physical Activity: Not on file  Stress: Not on file  Social Connections: Not on file   Education Assessment and Provision:  Detailed education and instructions provided on heart failure disease management including the following:  Signs and symptoms of Heart Failure When to call the physician Importance of daily weights Low sodium diet Fluid restriction Medication management Anticipated future follow-up appointments  Patient education given on each of the above topics.  Patient acknowledges understanding via teach back method and acceptance of all instructions.  Education Materials:  "Living Better With Heart Failure" Booklet, HF zone  tool, & Daily Weight Tracker Tool.  Patient has scale at home: Yes Patient has pill box at home: NA    High Risk Criteria for Readmission and/or Poor Patient Outcomes: Heart failure hospital admissions (last 6 months):  1 No Show rate: 3 % Difficult social situation: No Demonstrates medication adherence: Yes Primary Language: English Literacy level: Reading, writing, and comprehension.   Barriers of Care:   Diet/ fluid restrictions Daily weights Readmitted with in 2 days of discharge  Considerations/Referrals:   Referral made to Heart Failure Pharmacist Stewardship: No Referral made to Heart Failure CSW/NCM TOC: No Referral made to Heart & Vascular TOC clinic: Yes, 03/27/22 @ 12 noon.   Items for Follow-up on DC/TOC: Diet/ fluid restrictions ( Girl friend or family do his cooking) Daily weights    Earnestine Leys, BSN, RN Heart Failure Leisure centre manager Chat Only

## 2022-03-18 NOTE — Progress Notes (Signed)
[facility]) Hospital Liaison note:  This patient has been referred to [facility] Outpatient Palliative Care services. Will continue to follow for disposition.  Please call with any outpatient palliative questions or concerns.  Thank you for the opportunity to participate in this patient's care.   Thank you, Lorelee Market, LPN [facility] Liaison [phone]

## 2022-03-18 NOTE — Progress Notes (Signed)
PT Cancellation Note  Patient Details Name: Levi [name] MRN: [id number] DOB: 02-14-1929   Cancelled Treatment:    Reason Eval/Treat Not Completed: Other (comment). Pt fatigued after working with OT.   [place] 03/18/2022, 4:16 PM [place] Office [phone]

## 2022-03-18 NOTE — Progress Notes (Addendum)
Occupational Therapy Treatment Patient Details Name: Levi [name] MRN: [id number] DOB: 1928-11-28 Today's Date: 03/18/2022   History of present illness Pt is 88 yo male who presents to [facility] on 03/15/22 from home alone with SOB. PMH: CAD s/p CABG, CHF, COPD, HTN, HLD, morbid obesity, afib, pacer, prostate cancer.   OT comments  Pt making steady progress with OT but still with dyspnea 3/4 with activity and short distance mobility on 5 Ls mask.  HR remained at 74 BPM with rest and with activity. Only able to tolerate standing for 75 seconds with the RW before needing rest.  Still recommend 24 hr supervision/assistance at discharge with continued rehab at SNF level.    Recommendations for follow up therapy are one component of a multi-disciplinary discharge planning process, led by the attending physician.  Recommendations may be updated based on patient status, additional functional criteria and insurance authorization.    Follow Up Recommendations  Home health OT    Assistance Recommended at Discharge Frequent or constant Supervision/Assistance  Patient can return home with the following  A little help with bathing/dressing/bathroom;A little help with walking and/or transfers;Assistance with cooking/housework;Assist for transportation   Equipment Recommendations  None recommended by OT       Precautions / Restrictions Precautions Precautions: Fall Precaution Comments: watch O2 sats Restrictions Weight Bearing Restrictions: No       Mobility Bed Mobility Overal bed mobility: Needs Assistance Bed Mobility: Sit to Supine Rolling: Supervision   Supine to sit: Min assist (HOB elevated and use of the rail for support)     General bed mobility comments: Pt needs use of the rail and HOB elevated to sit up from supine.  He reports sleeping in a recliner for the last 3 years.    Transfers                         Balance Overall balance assessment: Needs  assistance Sitting-balance support: Feet supported Sitting balance-Leahy Scale: Good     Standing balance support: Bilateral upper extremity supported, No upper extremity supported, Reliant on assistive device for balance Standing balance-Leahy Scale: Fair Standing balance comment: Pt needs use of walker and UE support during mobility.                           ADL either performed or assessed with clinical judgement   ADL Overall ADL's : Needs assistance/impaired                         Toilet Transfer: Min guard;BSC/3in1;Ambulation;Rolling walker (2 wheels) Toilet Transfer Details (indicate cue type and reason): simulated         Functional mobility during ADLs: Min guard;Rolling walker (2 wheels) General ADL Comments: Pt tolerated standing for 1 min 15 seconds on 5 Ls mask.  HR maintained around 74 BPM with O2 decreasing from 95% down to 93% during standing and with mobility to the door of the room and back.  Dyspnea 3/4 noted with activity.  Pt declines ADLs, grooming tasks at the sink, or toileting tasks.               Cognition Arousal/Alertness: Awake/alert Behavior During Therapy: WFL for tasks assessed/performed Overall Cognitive Status: Within Functional Limits for tasks assessed  Pertinent Vitals/ Pain       Pain Assessment Pain Assessment: Faces Pain Score: 0-No pain         Frequency  Min 2X/week        Progress Toward Goals  OT Goals(current goals can now be found in the care plan section)  Progress towards OT goals: Progressing toward goals  Acute Rehab OT Goals OT Goal Formulation: With patient Time For Goal Achievement: 03/30/22 Potential to Achieve Goals: Good  Plan Discharge plan needs to be updated       AM-PAC OT "6 Clicks" Daily Activity     Outcome Measure   Help from another person eating meals?: None Help from another person taking  care of personal grooming?: A Little Help from another person toileting, which includes using toliet, bedpan, or urinal?: A Little Help from another person bathing (including washing, rinsing, drying)?: A Little Help from another person to put on and taking off regular upper body clothing?: A Little Help from another person to put on and taking off regular lower body clothing?: A Little 6 Click Score: 19    End of Session Equipment Utilized During Treatment: Rolling walker (2 wheels)  OT Visit Diagnosis: Unsteadiness on feet (R26.81);Other abnormalities of gait and mobility (R26.89);Muscle weakness (generalized) (M62.81)   Activity Tolerance Patient limited by fatigue   Patient Left in bed;with call bell/phone within reach;with bed alarm set   Nurse Communication Mobility status        Time: 1340-1414 OT Time Calculation (min): 34 min  Charges: OT General Charges $OT Visit: 1 Visit OT Treatments $Self Care/Home Management : 23-37 mins   Sameena Artus OTR/L 03/18/2022, 4:25 PM

## 2022-03-18 NOTE — Progress Notes (Signed)
Progress Note   Patient: Levi [name] OJJ:[id number] DOB: 1929-08-14 DOA: 03/15/2022     2 DOS: the patient was seen and examined on 03/18/2022   Brief hospital course: Levi [name] was admitted to the hospital with the working diagnosis of decompensated heart failure  88 yo male with the past medical history of coronary artery disease, sp CABG, heart failure, COPD, hypertension, obesity class 3 and dyslipidemia who presented with dyspnea. Recent hospitalization from 07/03 to 07/08, for heart failure. Reported at home recurrent worsening dyspnea and lower extremity edema. On his initial physical examination his blood pressure was 130/69, HR 70, RR 22 and 02 saturation 93%, lungs with scattered wheezing and decreased breath sounds, increased work of breathing, heart with S1 and S2 present and rhythmic, abdomen not distended and positive lower extremity edema.   Na 138, K 4,1 CL 96, bicarbonate 28, glucose 110 bun 27 cr 1,76  Wbc 10,5 hgb 15.0 plt 263  Sars covid 19 negative   Chest radiograph with cardiomegaly, bilateral interstitial infiltrates predominant lower lobes, with dense infiltrate at the right upper lobe (mid lung). Pacemaker in place with one atrial and one ventricular lead. Sternotomy wires in place.   EKG with 70 bpm, left axis deviation and left bundle branch, atrial sensed and ventricular paced, no significant ST segment or T wave changes.    Patient was placed on furosemide for diuresis.  07/16 continue volume overloaded. 07/17 slowly improving volume status, and decreasing 02 requirements.   Assessment and Plan: Acute on chronic diastolic CHF (congestive heart failure) (HCC) Echocardiogram from 07/04 with preserved LV systolic function with EF 50 to 55%, RV with preserved systolic function, mild enlargement of RV cavity. Severe TR.   Documented urine output is 9,371 ml  Systolic blood pressure 696 to 150 mmHg. Continue with clinically hypervolemia.   Continue  diuresis with furosemide 60 mg IV q12, spironolactone and SGLT 2 inh.   Afterload reduction with hydralazine 50 mg bid.  Essential hypertension Continue blood pressure control with hydralazine and continue diuresis with furosemide, empagliflozin and spironolactone.   Acute kidney injury superimposed on chronic kidney disease (HCC) CKD stage 3b.  Improved diuresis but continue volume overloaded.    renal function with serum cr at 1,86 with K at 3,6 and serum bicarbonate at 29.  Plan to continue diuresis with furosemide, spironolactone and SGLT 2 inh.  Follow up renal function in am, avoid hypotension and nephrotoxic medications.   Add 20 kcl today to prevent hypokalemia.   PAF (paroxysmal atrial fibrillation) (HCC) Rate control with amiodarone and anticoagulation with apixaban Continue telemetry monitoring.   HLD (hyperlipidemia) Continue with statin therapy.   Hypothyroidism On levothyroxine   Class 3 obesity (HCC) Calculated BMI is 40,3         Subjective: Patient with improvement in dyspnea but not yet back to baseline, his lower extremity edema continue to improve.   Physical Exam: Vitals:   03/17/22 1650 03/17/22 1947 03/18/22 0510 03/18/22 1101  BP: (!) 141/71 (!) 143/60 127/66 (!) 141/66  Pulse: 76 75 72   Resp: (!) 21 20 20    Temp:  (!) 97.4 F (36.3 C) 97.6 F (36.4 C)   TempSrc:  Oral Oral   SpO2: 96% 94% 92%   Weight:   123.7 kg   Height:       Neurology awake and alert ENT with mild pallor Cardiovascular with S1 and S2 present and rhythmic with no gallops, rubs or murmurs Moderate JVD Lower extremity edema +  to ++ Respiratory with scattered raled but not wheezing Abdomen not distended  Data Reviewed:  Family Communication: I spoke with patient's family  at the bedside, we talked in detail about patient's condition, plan of care and prognosis and all questions were addressed.   Disposition: Status is: Inpatient Remains inpatient appropriate  because: heart failure   Planned Discharge Destination: Home     Author: Tawni Millers, MD 03/18/2022 5:28 PM  For on call review [email or web address].

## 2022-03-18 NOTE — Progress Notes (Signed)
Pt self administered his home CPAP.

## 2022-03-19 ENCOUNTER — Ambulatory Visit: Payer: Medicare Other | Admitting: Internal Medicine

## 2022-03-19 DIAGNOSIS — I480 Paroxysmal atrial fibrillation: Secondary | ICD-10-CM | POA: Diagnosis not present

## 2022-03-19 DIAGNOSIS — I10 Essential (primary) hypertension: Secondary | ICD-10-CM | POA: Diagnosis not present

## 2022-03-19 DIAGNOSIS — I5033 Acute on chronic diastolic (congestive) heart failure: Secondary | ICD-10-CM | POA: Diagnosis not present

## 2022-03-19 DIAGNOSIS — N179 Acute kidney failure, unspecified: Secondary | ICD-10-CM | POA: Diagnosis not present

## 2022-03-19 LAB — BASIC METABOLIC PANEL
Anion gap: 11 (ref 5–15)
BUN: 36 mg/dL — ABNORMAL HIGH (ref 8–23)
CO2: 29 mmol/L (ref 22–32)
Calcium: 8.8 mg/dL — ABNORMAL LOW (ref 8.9–10.3)
Chloride: 98 mmol/L (ref 98–111)
Creatinine, Ser: 1.53 mg/dL — ABNORMAL HIGH (ref 0.61–1.24)
GFR, Estimated: 43 mL/min — ABNORMAL LOW (ref >60)
Glucose, Bld: 102 mg/dL — ABNORMAL HIGH (ref 70–99)
Potassium: 3.7 mmol/L (ref 3.5–5.1)
Sodium: 138 mmol/L (ref 135–145)

## 2022-03-19 MED ORDER — BISACODYL 10 MG RE SUPP
10.0000 mg | Freq: Once | RECTAL | Status: DC
  Filled 2022-03-19: qty 1

## 2022-03-19 MED ORDER — FUROSEMIDE 10 MG/ML IJ SOLN
80.0000 mg | Freq: Two times a day (BID) | INTRAMUSCULAR | Status: DC
  Administered 2022-03-19 – 2022-03-22 (×6): 80 mg via INTRAVENOUS
  Filled 2022-03-19 (×6): qty 8

## 2022-03-19 MED ORDER — IPRATROPIUM-ALBUTEROL 0.5-2.5 (3) MG/3ML IN SOLN: 3.0000 mL | Freq: Four times a day (QID) | RESPIRATORY_TRACT | Status: DC | PRN

## 2022-03-19 MED ORDER — POTASSIUM CHLORIDE CRYS ER 20 MEQ PO TBCR
40.0000 meq | EXTENDED_RELEASE_TABLET | Freq: Once | ORAL | Status: AC
Start: 2022-03-19 — End: 2022-03-19
  Administered 2022-03-19: 40 meq via ORAL
  Filled 2022-03-19: qty 2

## 2022-03-19 NOTE — TOC Progression Note (Signed)
Transition of Care [facility]) - Progression Note    Patient Details  Name: Levi [name] MRN: [id number] Date of Birth: 05-10-1929  [name] CM/SW Contact  Zenon Mayo, RN Phone Number: 03/19/2022, 10:25 AM  Clinical Narrative:    Patient is active with [facility] for [facility], [place], [place], will need resumption orders.    Expected Discharge Plan: [place] Barriers to Discharge: Continued Medical Work up  Expected Discharge Plan and Services Expected Discharge Plan: [place]   Discharge Planning Services: CM Consult Post Acute Care Choice: [place] arrangements for the past 2 months: [place]: [place] Date [place]: 03/17/22 Time [place]: 1146 Representative spoke with at [place]: [place] Determinants of Health (SDOH) Interventions Food Insecurity Interventions: Intervention Not Indicated Housing Interventions: Intervention Not Indicated Transportation Interventions: Intervention Not Indicated  Readmission Risk Interventions     No data to display

## 2022-03-19 NOTE — Progress Notes (Signed)
Progress Note   Patient: Levi [name] [id number]:[id number] DOB: Jan 28, 1929 DOA: 03/15/2022     3 DOS: the patient was seen and examined on 03/19/2022   Brief hospital course: Levi [name] was admitted [name] the hospital with the working diagnosis of decompensated heart failure  88 yo male with the past medical history of coronary artery disease, sp CABG, heart failure, COPD, hypertension, obesity class 3 and dyslipidemia who presented with dyspnea. Recent hospitalization from 07/03 [name] 07/08, for heart failure. Reported at home recurrent worsening dyspnea and lower extremity edema. On his initial physical examination his blood pressure was 130/69, HR 70, RR 22 and 02 saturation 93%, lungs with scattered wheezing and decreased breath sounds, increased work of breathing, heart with S1 and S2 present and rhythmic, abdomen not distended and positive lower extremity edema.   Na 138, K 4,1 CL 96, bicarbonate 28, glucose 110 bun 27 cr 1,76  Wbc 10,5 hgb 15.0 plt 263  Sars covid 19 negative   Chest radiograph with cardiomegaly, bilateral interstitial infiltrates predominant lower lobes, with dense infiltrate at the right upper lobe (mid lung). Pacemaker in place with one atrial and one ventricular lead. Sternotomy wires in place.   EKG with 70 bpm, left axis deviation and left bundle branch, atrial sensed and ventricular paced, no significant ST segment or T wave changes.    Patient was placed on furosemide for diuresis.  07/16 continue volume overloaded. 07/17 slowly improving volume status, and decreasing 02 requirements.  07/18 not yet at baseline.   Assessment and Plan: Acute on chronic diastolic CHF (congestive heart failure) (HCC) Echocardiogram from 07/04 with preserved LV systolic function with EF 50 [name] 55%, RV with preserved systolic function, mild enlargement of RV cavity. Severe TR.   Documented urine output is 4,503UU  Systolic blood pressure 828 [name] 146 mmHg. Continue with clinically  hypervolemia.   Increase furosemide [name] 80 mg IV q12, continue with spironolactone and SGLT 2 inh.   Afterload reduction with hydralazine 50 mg bid.  Essential hypertension Continue blood pressure control with hydralazine and continue diuresis with furosemide, empagliflozin and spironolactone.   Acute kidney injury superimposed on chronic kidney disease (HCC) CKD stage 3b.  Renal function with serum cr at 1,53 K is 3,7 and serum bicarbonate at 29.   Diuresis with furosemide (increase dose [name] 80 mg q12 hrs), spironolactone and SGLT 2 inh.  Follow up renal function in am, avoid hypotension and nephrotoxic medications.   Continue Kcl supplementation.   PAF (paroxysmal atrial fibrillation) (HCC) Rate control with amiodarone and anticoagulation with apixaban Continue telemetry monitoring.   HLD (hyperlipidemia) Continue with statin therapy.   Hypothyroidism On levothyroxine   Class 3 obesity (HCC) Calculated BMI is 40,3         Subjective: Patient with no chest pain, positive dyspnea on exertion, he has been constipated with increased abdominal distention   Physical Exam: Vitals:   03/18/22 1950 03/19/22 0505 03/19/22 0824 03/19/22 1210  BP: 140/77 133/65 (!) 146/68 (!) 146/70  Pulse: 76 72 74 79  Resp: 18 18 19 19   Temp: 98 F (36.7 C) 98.4 F (36.9 C) 98.1 F (36.7 C) 98.1 F (36.7 C)  TempSrc: Oral Oral Oral Oral  SpO2: 98% 92% 93% 94%  Weight:  122.9 kg    Height:       Neurology awake and alert ENT with mild pallor Cardiovascular with S1 and S2 present and rhythmic with positive murmur at the apex, 3/6 Mild JVD Lower extremity edema +  Respiratory with rales and no wheezing or rhonchi Abdomen distended and tender [name] deep palpation   Data Reviewed:    Family Communication: I spoke with patient's grilfriend at the bedside, we talked in detail about patient's condition, plan of care and prognosis and all questions were addressed.   Disposition: Status  is: Inpatient Remains inpatient appropriate because: IV diuresis   Planned Discharge Destination: Home  Author: Tawni Millers, MD 03/19/2022 2:41 PM  For on call review [email or web address].

## 2022-03-19 NOTE — Progress Notes (Signed)
Nutrition Brief Note  Consult received for nutrition assessment.  Pt reports no recent weight changes but his scale has not been working so he is no longer weighing himself daily. He lives alone but daughter lives beside him and he often visits a friend during the day and they eat lunch and supper together. He no longer uses the salt shaker. Breakfast is cereal with fruit with one cup of black coffee, lunch is usually out and supper is most often something light. He is always home by 8 pm.   Familiar from previous admissions. Spoke with friend who is at bedside.  Per pt appetite is good, ate 100% of lunch.  Reports that he needs to use the bathroom.  Palliative Care following.   Wt Readings from Last 15 Encounters:  03/19/22 122.9 kg  03/09/22 125.7 kg  12/20/21 130.6 kg  10/03/21 131.1 kg  09/05/21 129.7 kg  07/31/21 133.9 kg  06/12/21 133.9 kg  02/02/21 133.4 kg  01/23/21 (!) 137.2 kg  11/21/20 (!) 139.3 kg  08/04/20 135.7 kg  05/01/20 (!) 137.9 kg  03/20/20 135.4 kg  03/14/20 127.9 kg  12/08/19 135.2 kg    Body mass index is 40.01 kg/m.   Current diet order is Heart Healthy, patient is consuming approximately 100% of meals at this time. Labs and medications reviewed.   No nutrition interventions warranted at this time. If nutrition issues arise, please consult RD.   Lockie Pares., RD, LDN, CNSC See AMiON for contact information

## 2022-03-19 NOTE — Progress Notes (Signed)
Physical Therapy Treatment Patient Details Name: Levi [name] MRN: [id number] DOB: 1929/07/20 Today's Date: 03/19/2022   History of Present Illness Pt is 88 yo male who presents to [facility] on 03/15/22 from home alone with SOB. PMH: CAD s/p CABG, CHF, COPD, HTN, HLD, morbid obesity, afib, pacer, prostate cancer.    PT Comments    Pt making steady progress. Worked on pacing for mobility so as to not overfatigue. Per RT note on 7/16 the O2 needs to be at least 5L with the mask that he uses. Amb on 6L (portable tank doesn't have option for 5) and pt with SpO2 >92% and dyspnea 3/4.    Recommendations for follow up therapy are one component of a multi-disciplinary discharge planning process, led by the attending physician.  Recommendations may be updated based on patient status, additional functional criteria and insurance authorization.  Follow Up Recommendations  Home health PT Can patient physically be transported by private vehicle: Yes   Assistance Recommended at Discharge Intermittent Supervision/Assistance  Patient can return home with the following Help with stairs or ramp for entrance;A little help with walking and/or transfers;Assistance with cooking/housework;Assist for transportation   Equipment Recommendations  None recommended by PT    Recommendations for Other Services       Precautions / Restrictions Precautions Precautions: Other (comment) Precaution Comments: O2 must be at 5L or greater with his mask from home Restrictions Weight Bearing Restrictions: No     Mobility  Bed Mobility Overal bed mobility: Needs Assistance Bed Mobility: Supine to Sit     Supine to sit: Min assist     General bed mobility comments: Assist to elevate trunk into sitting. Sleeps in lift chair at home    Transfers Overall transfer level: Needs assistance Equipment used: Rolling walker (2 wheels) Transfers: Sit to/from Stand Sit to Stand: Min guard           General transfer  comment: Verbal cues for hand placement    Ambulation/Gait Ambulation/Gait assistance: Supervision Gait Distance (Feet): 100 Feet Assistive device: Rolling walker (2 wheels) Gait Pattern/deviations: Step-through pattern, Wide base of support, Decreased stride length Gait velocity: decr Gait velocity interpretation: 1.31 - 2.62 ft/sec, indicative of limited community ambulator   General Gait Details: Cues to pace himself and to limit distance to not overfatigue himself   Marine scientist [place] (Stroke Patients Only)       Balance Overall balance assessment: Needs assistance Sitting-balance support: No upper extremity supported, Feet supported Sitting balance-Leahy Scale: Good     Standing balance support: Bilateral upper extremity supported Standing balance-Leahy Scale: Poor Standing balance comment: walker and supervision for static standing                            Cognition Arousal/Alertness: Awake/alert Behavior During Therapy: WFL for tasks assessed/performed Overall Cognitive Status: Within Functional Limits for tasks assessed                                          Exercises      General Comments General comments (skin integrity, edema, etc.): SpO2 >92% with O2 on 6L with simple face mask      Pertinent Vitals/Pain      Home Living  Prior Function            PT Goals (current goals can now be found in the care plan section) Acute Rehab PT Goals Patient Stated Goal: return home Progress towards PT goals: Progressing toward goals    Frequency    Min 3X/week      PT Plan Discharge plan needs to be updated    Co-evaluation              AM-PAC PT "6 Clicks" Mobility   Outcome Measure  Help needed turning from your back to your side while in a flat bed without using bedrails?: A Little Help needed moving from lying on your back  to sitting on the side of a flat bed without using bedrails?: A Little Help needed moving to and from a bed to a chair (including a wheelchair)?: A Little Help needed standing up from a chair using your arms (e.g., wheelchair or bedside chair)?: A Little Help needed to walk in hospital room?: A Little Help needed climbing 3-5 steps with a railing? : A Lot 6 Click Score: 17    End of Session Equipment Utilized During Treatment: Gait belt;Oxygen Activity Tolerance: Patient tolerated treatment well Patient left: in chair;with call bell/phone within reach;with family/visitor present Nurse Communication: Mobility status PT Visit Diagnosis: Other abnormalities of gait and mobility (R26.89);Muscle weakness (generalized) (M62.81)     Time: 0902-0920 PT Time Calculation (min) (ACUTE ONLY): 18 min  Charges:  $Gait Training: 8-22 mins                     [place] [facility] [place] 03/19/2022, 12:41 PM

## 2022-03-20 ENCOUNTER — Inpatient Hospital Stay (HOSPITAL_COMMUNITY): Payer: Medicare Other

## 2022-03-20 DIAGNOSIS — I509 Heart failure, unspecified: Secondary | ICD-10-CM

## 2022-03-20 LAB — BASIC METABOLIC PANEL
Anion gap: 12 (ref 5–15)
BUN: 35 mg/dL — ABNORMAL HIGH (ref 8–23)
CO2: 29 mmol/L (ref 22–32)
Calcium: 8.6 mg/dL — ABNORMAL LOW (ref 8.9–10.3)
Chloride: 97 mmol/L — ABNORMAL LOW (ref 98–111)
Creatinine, Ser: 1.63 mg/dL — ABNORMAL HIGH (ref 0.61–1.24)
GFR, Estimated: 40 mL/min — ABNORMAL LOW (ref >60)
Glucose, Bld: 107 mg/dL — ABNORMAL HIGH (ref 70–99)
Potassium: 4 mmol/L (ref 3.5–5.1)
Sodium: 138 mmol/L (ref 135–145)

## 2022-03-20 LAB — MAGNESIUM: Magnesium: 2.7 mg/dL — ABNORMAL HIGH (ref 1.7–2.4)

## 2022-03-20 NOTE — Progress Notes (Signed)
[facility]) Hospital Liaison Note:   Notified by [facility] Tomi Bamberger of patient/family request for [facility] Palliative services at home after discharge.   [facility] hospital Liaison will follow patient for discharge disposition.   Please call with any hospice or outpatient palliative care related questions.   Thank you for the opportunity to participate in the patient's care.   [facility] Liaison [phone]

## 2022-03-20 NOTE — Progress Notes (Signed)
    Durable Medical Equipment  (From admission, onward)           Start     Ordered   03/20/22 1510  For home use only DME Bedside commode  Once       Question:  Patient needs a bedside commode to treat with the following condition  Answer:  Weakness   03/20/22 1509   03/20/22 1508  For home use only DME Hospital bed  Once       Question Answer Comment  Length of Need Lifetime   Patient has (list medical condition): CHF   The above medical condition requires: Patient requires the ability to reposition frequently   Head must be elevated greater than: 30 degrees   Bed type Semi-electric   Support Surface: Gel Overlay      03/20/22 1508

## 2022-03-20 NOTE — Progress Notes (Signed)
PROGRESS NOTE    Levi [name]  HMC:[id number] DOB: 09-10-1928 DOA: 03/15/2022 PCP: Bernerd Limbo, MD  89/M with history of CAD, CABG, COPD, chronic diastolic CHF, obesity, OSA was recently hospitalized with CHF from 7/3-7/8 readmitted with worsening dyspnea and edema.  Chest x-ray noted cardiomegaly, bilateral interstitial infiltrates and right upper lobe infiltrate -Admitted, started on diuretics  Subjective: Overall improving however still with some dyspnea  Assessment and Plan:  Acute on chronic diastolic CHF (congestive heart failure) ([place]) Echo- 07/23 with preserved LV systolic function with EF 50 to 55%, RV with preserved systolic function, mild enlargement of RV cavity. Severe TR.  -Continue IV Lasix today, he is 6.5 L negative, creatinine stable at 1.6 -Continue Aldactone, SGLT2i  Acute hypoxic respiratory failure -Secondary to CHF, also suspect a component of COPD,?  ILD, lower lung scarring noted on lung cuts of CT abdomen on 1/23 -May need HRCT if oxygen requirements do not improve -Wean O2 as tolerated  Essential hypertension -Stable, continue current meds -Creatinine improving, continue diuretics  PAF (paroxysmal atrial fibrillation) (HCC) Rate control with amiodarone and anticoagulation with apixaban Continue telemetry monitoring.   HLD (hyperlipidemia) Continue with statin therapy.   Hypothyroidism On levothyroxine   Class 3 obesity (HCC) Calculated BMI is 40,3   Goals of care -Elderly 89/M with recurrent hospitalizations with CHF, seen by palliative care, now DNR, plan for palliative care follow-up after discharge   DVT prophylaxis: Apixaban Code Status: DNR Family Communication: Discussed with patient and daughter at bedside Disposition Plan: Home likely 2 to 3 days  Consultants:    Procedures:   Antimicrobials:    Objective: Vitals:   03/19/22 1935 03/20/22 0340 03/20/22 0345 03/20/22 1152  BP: (!) 158/71 137/67  (!) 143/74   Pulse: 74 75  72  Resp: 18 18  19   Temp: 97.8 F (36.6 C) 98 F (36.7 C)  98 F (36.7 C)  TempSrc: Oral Oral  Oral  SpO2: 94% 94%  95%  Weight:   122.5 kg   Height:        Intake/Output Summary (Last 24 hours) at 03/20/2022 1407 Last data filed at 03/20/2022 1100 Gross per 24 hour  Intake 123 ml  Output 2150 ml  Net -2027 ml   Filed Weights   03/18/22 0510 03/19/22 0505 03/20/22 0345  Weight: 123.7 kg 122.9 kg 122.5 kg    Examination:  General exam: Elderly chronically ill male sitting up in bed, AAO x2 HEENT: Positive JVD CVS: S1-S2, regular rhythm Lungs: Coarse bilateral rales Abdomen: Soft, nontender, bowel sounds present Extremities: 1+ edema Skin: No rashes Psychiatry:  Mood & affect appropriate.     Data Reviewed:   CBC: Recent Labs  Lab 03/15/22 1417 03/16/22 0321  WBC 10.5 7.6  NEUTROABS 8.5*  --   HGB 15.0 14.7  HCT 46.6 44.9  MCV 96.1 94.7  PLT 263 662   Basic Metabolic Panel: Recent Labs  Lab 03/16/22 0321 03/17/22 0425 03/18/22 0511 03/19/22 0411 03/20/22 0332  NA 139 135 139 138 138  K 4.5 3.7 3.6 3.7 4.0  CL 97* 98 98 98 97*  CO2 29 27 29 29 29   GLUCOSE 168* 97 102* 102* 107*  BUN 31* 37* 41* 36* 35*  CREATININE 1.93* 1.61* 1.86* 1.53* 1.63*  CALCIUM 9.0 8.7* 8.7* 8.8* 8.6*  MG  --  2.8*  --   --  2.7*   GFR: Estimated Creatinine Clearance: 38.2 mL/min (A) (by C-G formula based on SCr of 1.63 mg/dL (  H)). Liver Function Tests: Recent Labs  Lab 03/15/22 1417  AST 29  ALT 24  ALKPHOS 100  BILITOT 0.7  PROT 7.4  ALBUMIN 3.5   No results for input(s): "LIPASE", "AMYLASE" in the last 168 hours. No results for input(s): "AMMONIA" in the last 168 hours. Coagulation Profile: No results for input(s): "INR", "PROTIME" in the last 168 hours. Cardiac Enzymes: No results for input(s): "CKTOTAL", "CKMB", "CKMBINDEX", "TROPONINI" in the last 168 hours. BNP (last 3 results) Recent Labs    05/11/21 1025  PROBNP 1,997*    HbA1C: No results for input(s): "HGBA1C" in the last 72 hours. CBG: No results for input(s): "GLUCAP" in the last 168 hours. Lipid Profile: No results for input(s): "CHOL", "HDL", "LDLCALC", "TRIG", "CHOLHDL", "LDLDIRECT" in the last 72 hours. Thyroid Function Tests: No results for input(s): "TSH", "T4TOTAL", "FREET4", "T3FREE", "THYROIDAB" in the last 72 hours. Anemia Panel: No results for input(s): "VITAMINB12", "FOLATE", "FERRITIN", "TIBC", "IRON", "RETICCTPCT" in the last 72 hours. Urine analysis:    Component Value Date/Time   COLORURINE YELLOW 09/05/2021 1552   APPEARANCEUR CLEAR 09/05/2021 1552   LABSPEC 1.012 09/05/2021 1552   PHURINE 5.5 09/05/2021 1552   GLUCOSEU 250 (A) 09/05/2021 1552   HGBUR NEGATIVE 09/05/2021 1552   BILIRUBINUR NEGATIVE 09/05/2021 1552   KETONESUR NEGATIVE 09/05/2021 1552   PROTEINUR TRACE (A) 09/05/2021 1552   UROBILINOGEN 0.2 03/01/2014 1440   NITRITE NEGATIVE 09/05/2021 1552   LEUKOCYTESUR NEGATIVE 09/05/2021 1552   Sepsis Labs: @LABRCNTIP (procalcitonin:4,lacticidven:4)  ) Recent Results (from the past 240 hour(s))  SARS Coronavirus 2 by RT PCR (hospital order, performed in [place] hospital lab) *cepheid single result test* Anterior Nasal Swab     Status: None   Collection Time: 03/15/22  5:00 PM   Specimen: Anterior Nasal Swab  Result Value Ref Range Status   SARS Coronavirus 2 by RT PCR NEGATIVE NEGATIVE Final    Comment: (NOTE) SARS-CoV-2 target nucleic acids are NOT DETECTED.  The SARS-CoV-2 RNA is generally detectable in upper and lower respiratory specimens during the acute phase of infection. The lowest concentration of SARS-CoV-2 viral copies this assay can detect is 250 copies / mL. A negative result does not preclude SARS-CoV-2 infection and should not be used as the sole basis for treatment or other patient management decisions.  A negative result may occur with improper specimen collection / handling, submission of  specimen other than nasopharyngeal swab, presence of viral mutation(s) within the areas targeted by this assay, and inadequate number of viral copies (<250 copies / mL). A negative result must be combined with clinical observations, patient history, and epidemiological information.  Fact Sheet for Patients:   https://www.patel.info/  Fact Sheet for Healthcare Providers: https://hall.com/  This test is not yet approved or  cleared by the [place] FDA and has been authorized for detection and/or diagnosis of SARS-CoV-2 by FDA under an Emergency Use Authorization (EUA).  This EUA will remain in effect (meaning this test can be used) for the duration of the COVID-19 declaration under Section 564(b)(1) of the Act, 21 U.S.C. section 360bbb-3(b)(1), unless the authorization is terminated or revoked sooner.  Performed at [facility], [place] [address], [place], [place] [address]      Radiology Studies: DG Chest 2 View  Result Date: 03/20/2022 CLINICAL DATA:  Dyspnea. EXAM: CHEST - 2 VIEW COMPARISON:  March 15, 2022 FINDINGS: Stable appearance of cardiac pacemaker and postsurgical changes from CABG and atrial appendage clipping. Enlarged cardiac silhouette. Persistent airspace opacity  in the right upper lobe. Diffusely coarsened interstitial markings. Osseous structures are without acute abnormality. Soft tissues are grossly normal. IMPRESSION: 1. Persistent airspace opacity in the right upper lobe. 2. Diffusely coarsened interstitial markings. 3. Enlarged cardiac silhouette. Electronically Signed   By: Fidela Salisbury M.D.   On: 03/20/2022 10:17     Scheduled Meds:  amiodarone  100 mg Oral Daily   apixaban  2.5 mg Oral BID   bisacodyl  10 mg Rectal Once   docusate sodium  100 mg Oral BID   furosemide  80 mg Intravenous BID   hydrALAZINE  50 mg Oral q12n4p   levothyroxine  112 mcg Oral Q0600   polyethylene glycol  17 g Oral QHS    pravastatin  20 mg Oral Daily   sodium chloride flush  3 mL Intravenous Q12H   spironolactone  12.5 mg Oral Daily   tamsulosin  0.4 mg Oral QHS   Continuous Infusions:   LOS: 4 days    Time spent: 34min  Domenic Polite, MD [facility]   03/20/2022, 2:07 PM

## 2022-03-20 NOTE — TOC Progression Note (Signed)
Transition of Care [facility]) - Progression Note    Patient Details  Name: Levi [name] MRN: [id number] Date of Birth: May 29, 1929  [facility]) CM/SW Contact  Zenon Mayo, RN Phone Number: 03/20/2022, 3:18 PM  Clinical Narrative:    NCM spoke with patient and daughter [name] at bedside.  Daughter states they will need hospital bed, she will be the contact for the delivery of the bed her phone is [phone].  She also states they need a bsc. She is ok with [employer] supplying this.  NCM made referral to [facility].  NCM offered choice for outpatient palliative services. She states she would like [facility].  NCM made referral to [place].  Will need orders for HHRN, HHPT, HHOT to resume.    Expected Discharge Plan: [place] Barriers to Discharge: Continued Medical Work up  Expected Discharge Plan and Services Expected Discharge Plan: [place]   Discharge Planning Services: CM Consult Post Acute Care Choice: Home Health, Durable Medical Equipment Living arrangements for the past 2 months: Single Family Home                 DME Arranged: Hospital bed, Bedside commode DME Agency: [employer] Date DME Agency Contacted: 03/20/22 Time DME Agency Contacted: 2025 Representative spoke with at DME Agency: Jodell Cipro HH Arranged: RN, PT, OT [facility] Agency: [facility] ([place]) Date [place]: 03/17/22 Time [place]: 1146 Representative spoke with at [place]: [place] (SDOH) Interventions Food Insecurity Interventions: Intervention Not Indicated Housing Interventions: Intervention Not Indicated Transportation Interventions: Intervention Not Indicated  Readmission Risk Interventions     No data to display

## 2022-03-21 DIAGNOSIS — I509 Heart failure, unspecified: Secondary | ICD-10-CM | POA: Diagnosis not present

## 2022-03-21 LAB — CBC
HCT: 47.2 % (ref 39.0–52.0)
Hemoglobin: 15.2 g/dL (ref 13.0–17.0)
MCH: 30.8 pg (ref 26.0–34.0)
MCHC: 32.2 g/dL (ref 30.0–36.0)
MCV: 95.7 fL (ref 80.0–100.0)
Platelets: 249 10*3/uL (ref 150–400)
RBC: 4.93 MIL/uL (ref 4.22–5.81)
RDW: 14.7 % (ref 11.5–15.5)
WBC: 12.3 10*3/uL — ABNORMAL HIGH (ref 4.0–10.5)
nRBC: 0 % (ref 0.0–0.2)

## 2022-03-21 LAB — BASIC METABOLIC PANEL
Anion gap: 11 (ref 5–15)
BUN: 31 mg/dL — ABNORMAL HIGH (ref 8–23)
CO2: 30 mmol/L (ref 22–32)
Calcium: 8.7 mg/dL — ABNORMAL LOW (ref 8.9–10.3)
Chloride: 98 mmol/L (ref 98–111)
Creatinine, Ser: 1.62 mg/dL — ABNORMAL HIGH (ref 0.61–1.24)
GFR, Estimated: 40 mL/min — ABNORMAL LOW (ref >60)
Glucose, Bld: 103 mg/dL — ABNORMAL HIGH (ref 70–99)
Potassium: 3.9 mmol/L (ref 3.5–5.1)
Sodium: 139 mmol/L (ref 135–145)

## 2022-03-21 MED ORDER — SPIRONOLACTONE 25 MG PO TABS
25.0000 mg | ORAL_TABLET | Freq: Every day | ORAL | Status: DC
  Administered 2022-03-22: 25 mg via ORAL
  Filled 2022-03-21: qty 1

## 2022-03-21 NOTE — Progress Notes (Addendum)
PROGRESS NOTE    Rahul Malinak Rockefeller  FWY:[id number] DOB: 07-29-1929 DOA: 03/15/2022 PCP: Bernerd Limbo, MD  89/M with history of CAD, CABG, COPD, chronic diastolic CHF, obesity, OSA was recently hospitalized with CHF from 7/3-7/8 readmitted with worsening dyspnea and edema.  Discharged home on O2 then, does not use nasal cannula, uses BMT/facemask instead at baseline, chest x-ray noted cardiomegaly, bilateral interstitial infiltrates and right upper lobe infiltrate -Admitted, started on diuretics  Subjective: Overall improving however still with some dyspnea  Assessment and Plan:  Acute on chronic diastolic CHF (congestive heart failure) ([place]) Echo- 07/23 with preserved LV systolic function with EF 50 to 55%, RV with preserved systolic function, mild enlargement of RV cavity. Severe TR.  -Modest improvement with diuresis, he is 8.4 L negative, creatinine stable at 1.6, continue Aldactone -Transition to oral diuretics tomorrow -Discussed with daughter regarding need for palliative care follow-up after discharge  Acute hypoxic respiratory failure -Secondary to CHF, also suspect a component of COPD,?  ILD, lower lung scarring noted on lung cuts of CT abdomen on 1/23 and chest x-rays -May need HRCT if oxygen requirements do not improve -Wean O2 as tolerated, down to 3 L  Right upper lobe nodule -Suspect limited benefit in following this at his age  Essential hypertension -Stable, continue current meds -Creatinine improving, continue diuretics  PAF (paroxysmal atrial fibrillation) (HCC) Rate control with amiodarone and anticoagulation with apixaban Continue telemetry monitoring.   HLD (hyperlipidemia) Continue with statin therapy.   Hypothyroidism On levothyroxine   Class 3 obesity (HCC) Calculated BMI is 40,3   Goals of care -Elderly 89/M with recurrent hospitalizations with CHF, seen by palliative care, now DNR, plan for palliative care follow-up after discharge   DVT  prophylaxis: Apixaban Code Status: DNR Family Communication: Discussed with patient and daughter at bedside Disposition Plan: Home likely 1 to 2 days  Consultants:    Procedures:   Antimicrobials:    Objective: Vitals:   03/20/22 1934 03/21/22 0410 03/21/22 0800 03/21/22 1100  BP: (!) 152/62 128/67 (!) 149/78 (!) 157/77  Pulse: 73 75 76 73  Resp: 18 19 19 19   Temp: (!) 97.1 F (36.2 C) 97.7 F (36.5 C) 97.8 F (36.6 C) 98 F (36.7 C)  TempSrc: Oral Oral Oral Oral  SpO2: (!) 88% 94% 98% 94%  Weight:  120.8 kg    Height:        Intake/Output Summary (Last 24 hours) at 03/21/2022 1346 Last data filed at 03/21/2022 1025 Gross per 24 hour  Intake 363 ml  Output 1850 ml  Net -1487 ml   Filed Weights   03/19/22 0505 03/20/22 0345 03/21/22 0410  Weight: 122.9 kg 122.5 kg 120.8 kg    Examination:  General exam: Elderly chronically ill male sitting up in bed, AAO x2 HEENT: Positive JVD CVS: S1-S2, regular rhythm Lungs: Coarse bilateral rales Abdomen: Soft, nontender, bowel sounds present Extremities: 1+ edema Skin: No rashes Psychiatry:  Mood & affect appropriate.     Data Reviewed:   CBC: Recent Labs  Lab 03/15/22 1417 03/16/22 0321 03/21/22 0241  WBC 10.5 7.6 12.3*  NEUTROABS 8.5*  --   --   HGB 15.0 14.7 15.2  HCT 46.6 44.9 47.2  MCV 96.1 94.7 95.7  PLT 263 239 277   Basic Metabolic Panel: Recent Labs  Lab 03/17/22 0425 03/18/22 0511 03/19/22 0411 03/20/22 0332 03/21/22 0241  NA 135 139 138 138 139  K 3.7 3.6 3.7 4.0 3.9  CL 98 98 98 97*  98  CO2 27 29 29 29 30   GLUCOSE 97 102* 102* 107* 103*  BUN 37* 41* 36* 35* 31*  CREATININE 1.61* 1.86* 1.53* 1.63* 1.62*  CALCIUM 8.7* 8.7* 8.8* 8.6* 8.7*  MG 2.8*  --   --  2.7*  --    GFR: Estimated Creatinine Clearance: 38.1 mL/min (A) (by C-G formula based on SCr of 1.62 mg/dL (H)). Liver Function Tests: Recent Labs  Lab 03/15/22 1417  AST 29  ALT 24  ALKPHOS 100  BILITOT 0.7  PROT 7.4   ALBUMIN 3.5   No results for input(s): "LIPASE", "AMYLASE" in the last 168 hours. No results for input(s): "AMMONIA" in the last 168 hours. Coagulation Profile: No results for input(s): "INR", "PROTIME" in the last 168 hours. Cardiac Enzymes: No results for input(s): "CKTOTAL", "CKMB", "CKMBINDEX", "TROPONINI" in the last 168 hours. BNP (last 3 results) Recent Labs    05/11/21 1025  PROBNP 1,997*   HbA1C: No results for input(s): "HGBA1C" in the last 72 hours. CBG: No results for input(s): "GLUCAP" in the last 168 hours. Lipid Profile: No results for input(s): "CHOL", "HDL", "LDLCALC", "TRIG", "CHOLHDL", "LDLDIRECT" in the last 72 hours. Thyroid Function Tests: No results for input(s): "TSH", "T4TOTAL", "FREET4", "T3FREE", "THYROIDAB" in the last 72 hours. Anemia Panel: No results for input(s): "VITAMINB12", "FOLATE", "FERRITIN", "TIBC", "IRON", "RETICCTPCT" in the last 72 hours. Urine analysis:    Component Value Date/Time   COLORURINE YELLOW 09/05/2021 1552   APPEARANCEUR CLEAR 09/05/2021 1552   LABSPEC 1.012 09/05/2021 1552   PHURINE 5.5 09/05/2021 1552   GLUCOSEU 250 (A) 09/05/2021 1552   HGBUR NEGATIVE 09/05/2021 1552   BILIRUBINUR NEGATIVE 09/05/2021 1552   KETONESUR NEGATIVE 09/05/2021 1552   PROTEINUR TRACE (A) 09/05/2021 1552   UROBILINOGEN 0.2 03/01/2014 1440   NITRITE NEGATIVE 09/05/2021 1552   LEUKOCYTESUR NEGATIVE 09/05/2021 1552   Sepsis Labs: @LABRCNTIP (procalcitonin:4,lacticidven:4)  ) Recent Results (from the past 240 hour(s))  SARS Coronavirus 2 by RT PCR (hospital order, performed in [place] hospital lab) *cepheid single result test* Anterior Nasal Swab     Status: None   Collection Time: 03/15/22  5:00 PM   Specimen: Anterior Nasal Swab  Result Value Ref Range Status   SARS Coronavirus 2 by RT PCR NEGATIVE NEGATIVE Final    Comment: (NOTE) SARS-CoV-2 target nucleic acids are NOT DETECTED.  The SARS-CoV-2 RNA is generally detectable in  upper and lower respiratory specimens during the acute phase of infection. The lowest concentration of SARS-CoV-2 viral copies this assay can detect is 250 copies / mL. A negative result does not preclude SARS-CoV-2 infection and should not be used as the sole basis for treatment or other patient management decisions.  A negative result may occur with improper specimen collection / handling, submission of specimen other than nasopharyngeal swab, presence of viral mutation(s) within the areas targeted by this assay, and inadequate number of viral copies (<250 copies / mL). A negative result must be combined with clinical observations, patient history, and epidemiological information.  Fact Sheet for Patients:   https://www.patel.info/  Fact Sheet for Healthcare Providers: https://hall.com/  This test is not yet approved or  cleared by the [place] FDA and has been authorized for detection and/or diagnosis of SARS-CoV-2 by FDA under an Emergency Use Authorization (EUA).  This EUA will remain in effect (meaning this test can be used) for the duration of the COVID-19 declaration under Section 564(b)(1) of the Act, 21 U.S.C. section 360bbb-3(b)(1), unless the authorization is terminated  or revoked sooner.  Performed at [facility], [place] [address], [place], [place] [address]      Radiology Studies: DG Chest 2 View  Result Date: 03/20/2022 CLINICAL DATA:  Dyspnea. EXAM: CHEST - 2 VIEW COMPARISON:  March 15, 2022 FINDINGS: Stable appearance of cardiac pacemaker and postsurgical changes from CABG and atrial appendage clipping. Enlarged cardiac silhouette. Persistent airspace opacity in the right upper lobe. Diffusely coarsened interstitial markings. Osseous structures are without acute abnormality. Soft tissues are grossly normal. IMPRESSION: 1. Persistent airspace opacity in the right upper lobe. 2. Diffusely coarsened interstitial  markings. 3. Enlarged cardiac silhouette. Electronically Signed   By: Fidela Salisbury M.D.   On: 03/20/2022 10:17     Scheduled Meds:  amiodarone  100 mg Oral Daily   apixaban  2.5 mg Oral BID   bisacodyl  10 mg Rectal Once   docusate sodium  100 mg Oral BID   furosemide  80 mg Intravenous BID   hydrALAZINE  50 mg Oral q12n4p   levothyroxine  112 mcg Oral Q0600   polyethylene glycol  17 g Oral QHS   pravastatin  20 mg Oral Daily   sodium chloride flush  3 mL Intravenous Q12H   spironolactone  12.5 mg Oral Daily   tamsulosin  0.4 mg Oral QHS   Continuous Infusions:   LOS: 5 days    Time spent: 35min  Domenic Polite, MD [facility]   03/21/2022, 1:46 PM

## 2022-03-21 NOTE — Care Management Important Message (Signed)
Important Message  Patient Details  Name: Levi [name] MRN: [id number] Date of Birth: 07/04/1929   Medicare Important Message Given:  Yes     Shelda Altes 03/21/2022, 9:25 AM

## 2022-03-21 NOTE — Progress Notes (Signed)
Patient has home CPAP for use.

## 2022-03-21 NOTE — Progress Notes (Signed)
Physical Therapy Treatment Patient Details Name: Rangel Echeverri MRN: [id number] DOB: 1928-12-26 Today's Date: 03/21/2022   History of Present Illness Pt is 88 yo male who presents to [facility] on 03/15/22 from home alone with SOB. PMH: CAD s/p CABG, CHF, COPD, HTN, HLD, morbid obesity, afib, pacer, prostate cancer.    PT Comments    Pt received OOB in chair and agreeable to session with encouragement, as pt stating increased fatigue and general weakness. Pt able to come to standing with light min assist to power up from low recliner and demonstrating ambulation in hall with min guard for safety with RW. Pt able to self pace distance and activity throughout session with cueing. Noted DOE 3/4 with SpO2 remaining >90% with 6L facemask during activity. Educated pt re; increasing activity tolerance slowly, activity recommendations and pacing, with pt verbalizing understanding. Pt continues to benefit from skilled PT services to progress toward functional mobility goals.    Recommendations for follow up therapy are one component of a multi-disciplinary discharge planning process, led by the attending physician.  Recommendations may be updated based on patient status, additional functional criteria and insurance authorization.  Follow Up Recommendations  Home health PT Can patient physically be transported by private vehicle: Yes   Assistance Recommended at Discharge Intermittent Supervision/Assistance  Patient can return home with the following Help with stairs or ramp for entrance;A little help with walking and/or transfers;Assistance with cooking/housework;Assist for transportation   Equipment Recommendations  None recommended by PT    Recommendations for Other Services       Precautions / Restrictions Precautions Precautions: Other (comment) Precaution Comments: O2 must be at 5L or greater with his mask from home Restrictions Weight Bearing Restrictions: No     Mobility  Bed  Mobility Overal bed mobility: Needs Assistance             General bed mobility comments: pt OOB in chair pre and post session    Transfers Overall transfer level: Needs assistance Equipment used: Rolling walker (2 wheels) Transfers: Sit to/from Stand Sit to Stand: Min guard, Min assist           General transfer comment: Verbal cues for hand placement, light min asssit to power up to standing from low recliner    Ambulation/Gait Ambulation/Gait assistance: Min guard Gait Distance (Feet): 75 Feet Assistive device: Rolling walker (2 wheels) Gait Pattern/deviations: Step-through pattern, Wide base of support, Decreased stride length Gait velocity: decr     General Gait Details: slwo shuffling gait, Cues to pace himself and to limit distance to not overfatigue himself, noted DOE, SpO2 >90% throughout   Stairs             Wheelchair Mobility    Modified Rankin (Stroke Patients Only)       Balance Overall balance assessment: Needs assistance Sitting-balance support: No upper extremity supported, Feet supported Sitting balance-Leahy Scale: Good     Standing balance support: Bilateral upper extremity supported Standing balance-Leahy Scale: Poor Standing balance comment: walker and supervision for static standing                            Cognition Arousal/Alertness: Awake/alert Behavior During Therapy: WFL for tasks assessed/performed Overall Cognitive Status: Within Functional Limits for tasks assessed  Exercises      General Comments General comments (skin integrity, edema, etc.): SpO2 >92% with O2 on 6L with simple face mask      Pertinent Vitals/Pain Pain Assessment Pain Assessment: No/denies pain    Home Living                          Prior Function            PT Goals (current goals can now be found in the care plan section) Acute Rehab PT  Goals Patient Stated Goal: return home PT Goal Formulation: With patient Time For Goal Achievement: 03/30/22    Frequency    Min 3X/week      PT Plan      Co-evaluation              AM-PAC PT "6 Clicks" Mobility   Outcome Measure  Help needed turning from your back to your side while in a flat bed without using bedrails?: A Little Help needed moving from lying on your back to sitting on the side of a flat bed without using bedrails?: A Little Help needed moving to and from a bed to a chair (including a wheelchair)?: A Little Help needed standing up from a chair using your arms (e.g., wheelchair or bedside chair)?: A Little Help needed to walk in hospital room?: A Little Help needed climbing 3-5 steps with a railing? : A Lot 6 Click Score: 17    End of Session Equipment Utilized During Treatment: Gait belt;Oxygen Activity Tolerance: Patient tolerated treatment well Patient left: in chair;with family/visitor present;with nursing/sitter in room (in chair at sink with NT present for wash up) Nurse Communication: Mobility status PT Visit Diagnosis: Other abnormalities of gait and mobility (R26.89);Muscle weakness (generalized) (M62.81)     Time: 4142-3953 PT Time Calculation (min) (ACUTE ONLY): 17 min  Charges:  $Therapeutic Activity: 8-22 mins                    Nicholous Girgenti R. PTA Acute Rehabilitation Services Office: [place] 03/21/2022, 10:43 AM

## 2022-03-21 NOTE — TOC Progression Note (Signed)
Transition of Care [facility]) - Progression Note    Patient Details  Name: Levi [name] MRN: [id number] Date of Birth: April 10, 1929  [facility] CM/SW Contact  Zenon Mayo, RN Phone Number: 03/21/2022, 11:27 AM  Clinical Narrative:    NCM spoke with [name] the daughter, she states [employer] is at the house now delivering the hospital bed and the [facility].    Expected Discharge Plan: [place] Barriers to Discharge: Continued Medical Work up  Expected Discharge Plan and Services Expected Discharge Plan: [place]   Discharge Planning Services: CM Consult Post Acute Care Choice: Home Health, Durable Medical Equipment Living arrangements for the past 2 months: Single Family Home                 DME Arranged: Hospital bed, Bedside commode DME Agency: [employer] Date DME Agency Contacted: 03/20/22 Time DME Agency Contacted: [id number] Representative spoke with at DME Agency: Jodell Cipro HH Arranged: RN, PT, OT [facility] Agency: [facility] Date [place]: 03/17/22 Time [place]: 1146 Representative spoke with at [place]: [place] (SDOH) Interventions Food Insecurity Interventions: Intervention Not Indicated Housing Interventions: Intervention Not Indicated Transportation Interventions: Intervention Not Indicated  Readmission Risk Interventions     No data to display

## 2022-03-22 DIAGNOSIS — I5043 Acute on chronic combined systolic (congestive) and diastolic (congestive) heart failure: Secondary | ICD-10-CM | POA: Diagnosis not present

## 2022-03-22 DIAGNOSIS — Z7189 Other specified counseling: Secondary | ICD-10-CM | POA: Diagnosis not present

## 2022-03-22 DIAGNOSIS — Z515 Encounter for palliative care: Secondary | ICD-10-CM | POA: Diagnosis not present

## 2022-03-22 LAB — CBC
HCT: 44.5 % (ref 39.0–52.0)
Hemoglobin: 14.8 g/dL (ref 13.0–17.0)
MCH: 30.9 pg (ref 26.0–34.0)
MCHC: 33.3 g/dL (ref 30.0–36.0)
MCV: 92.9 fL (ref 80.0–100.0)
Platelets: 246 10*3/uL (ref 150–400)
RBC: 4.79 MIL/uL (ref 4.22–5.81)
RDW: 14.7 % (ref 11.5–15.5)
WBC: 10.3 10*3/uL (ref 4.0–10.5)
nRBC: 0 % (ref 0.0–0.2)

## 2022-03-22 LAB — BASIC METABOLIC PANEL
Anion gap: 11 (ref 5–15)
BUN: 37 mg/dL — ABNORMAL HIGH (ref 8–23)
CO2: 29 mmol/L (ref 22–32)
Calcium: 8.6 mg/dL — ABNORMAL LOW (ref 8.9–10.3)
Chloride: 98 mmol/L (ref 98–111)
Creatinine, Ser: 1.51 mg/dL — ABNORMAL HIGH (ref 0.61–1.24)
GFR, Estimated: 43 mL/min — ABNORMAL LOW (ref >60)
Glucose, Bld: 103 mg/dL — ABNORMAL HIGH (ref 70–99)
Potassium: 3.6 mmol/L (ref 3.5–5.1)
Sodium: 138 mmol/L (ref 135–145)

## 2022-03-22 MED ORDER — POTASSIUM CHLORIDE CRYS ER 20 MEQ PO TBCR: 20.0000 meq | EXTENDED_RELEASE_TABLET | Freq: Two times a day (BID) | ORAL | Status: AC

## 2022-03-22 NOTE — Discharge Instructions (Signed)
Nursing Education Pt educated on low sodium diet and how to request no salt meals when eating out with family and friends. All home health services and needs have been addressed with case manager. No signs nor symptoms of distress noted. Pt is appropriate for discharge.

## 2022-03-22 NOTE — Progress Notes (Addendum)
   Palliative Medicine Inpatient Follow Up Note   HPI: Levi [name] is a 88 y.o. male with medical history significant of CAD s/p CABG; chronic combined CHF; COPD; HTN; HLD; morbid obesity; afib; pacemaker placement; and prostate CA presenting with SOB.  He was last admitted from 7/3-8 with acute on chronic combined CHF, treated with Lasix and spironolactone and started on SGLT2 inhibitor.  OSA/OHS were thought to be contributors and he was discharged on home O2.     Palliative care has been consulted to have a goals of care conversation in the setting of multiple chronic co-morbid conditions.   Today's Discussion 03/22/2022  *Please note that this is a verbal dictation therefore any spelling or grammatical errors are due to the "[place] [facility]" system interpretation.  Chart reviewed inclusive of vital signs, progress notes, laboratory results, and diagnostic images.   I met with Levi [name] at bedside this morning. We reviewed the hope for discharge today. Discussed the importance of continued awareness in terms of diuresis management.   Plan for transition to home with OP Palliative support through [facility]  Questions and concerns addressed, palliative support provided.  Objective Assessment: Vital Signs Vitals:   03/21/22 2142 03/22/22 0400  BP:  137/63  Pulse:  75  Resp:  18  Temp:  (!) 97.5 F (36.4 C)  SpO2: 96% 92%    Intake/Output Summary (Last 24 hours) at 03/22/2022 1100 Last data filed at 03/22/2022 0400 Gross per 24 hour  Intake 240 ml  Output 1850 ml  Net -1610 ml    Last Weight  Most recent update: 03/22/2022  4:13 AM    Weight  122.6 kg (270 lb 4.5 oz)            Gen: Elderly Caucasian male in no acute distress HEENT: moist mucous membranes CV: Regular rate and rhythm  PULM: On 3 L nasal cannula breathing is even and nonlabored ABD: soft/nontender  EXT: (+)pedal edema and venous stasis dermatitis Neuro: Alert and oriented x3  SUMMARY  OF RECOMMENDATIONS   DNAR/DNI   Goal DNR on chart  Plan for discharge home this morning, Palliative arranged OP through  [place] based on MDM: Moderate  ______________________________________________________________________________________ [place] Team Team Cell Phone: [phone] Please utilize secure chat with additional questions, if there is no response within 30 minutes please call the above phone number  Palliative Medicine Team providers are available by phone from 7am to 7pm daily and Levi be reached through the team cell phone.  Should this patient require assistance outside of these hours, please call the patient's attending physician.

## 2022-03-22 NOTE — Discharge Summary (Signed)
Physician Discharge Summary  Levi [name] ZDG:[id number] DOB: 23-Jan-1929 DOA: 03/15/2022  PCP: Bernerd Limbo, MD  Admit date: 03/15/2022 Discharge date: 03/22/2022  Time spent: 35 minutes  Recommendations for Outpatient Follow-up:  CH[facility] clinic next week Cardiology Dr. Harrington Challenger in 1 month Palliative care follow-up recommended   Discharge Diagnoses:  Acute hypoxic respiratory failure   Acute on chronic diastolic CHF (congestive heart failure) ([place]) Suspected COPD Pulmonary fibrosis   Essential hypertension   Acute kidney injury superimposed on chronic kidney disease (HCC)   PAF (paroxysmal atrial fibrillation) (HCC)   HLD (hyperlipidemia)   Hypothyroidism   Class 3 obesity ([place])   Discharge Condition: Stable  Diet recommendation: Low-sodium, heart healthy  Filed Weights   03/20/22 0345 03/21/22 0410 03/22/22 0400  Weight: 122.5 kg 120.8 kg 122.6 kg    History of present illness:  88/M with history of CAD, CABG, COPD, chronic diastolic CHF, obesity, OSA was recently hospitalized with CHF from 7/3-7/8 readmitted with worsening dyspnea and edema.  Discharged home on O2 then, does not use nasal cannula, uses BMT/facemask instead at baseline, chest x-ray noted cardiomegaly, bilateral interstitial infiltrates and right upper lobe infiltrate -Admitted, started on diuretics  Hospital Course:   Acute hypoxic respiratory failure -Multifactorial, secondary to CHF, and a component of COPD,?  Pulmonary fibrosis, lower lung scarring noted on lung cuts of CT abdomen on 1/23 and chest x-rays -O2 weaned down to 3 L which is his baseline oxygen requirement -Had considerable improvement with diuresis however I suspect pulmonary scarring/fibrosis, COPD is contributing to his dyspnea and oxygen requirement as well  Acute on chronic diastolic CHF (congestive heart failure) ([place]) -Echo- 07/23 with preserved LV systolic function with EF 50 to 55%, RV with preserved systolic function, mild  enlargement of RV cavity. Severe TR.  -Modest improvement with diuresis, he is 10.8 L negative, creatinine stable at 1.6, -started on Aldactone, transitioned back to torsemide 60 Mg twice daily, patient and family educated regarding low-salt diet, advised to weigh him every day, instructed to call Dr. Alan Ripper office for weight gain -Also seen by palliative care this admission on account of recurrent hospitalizations, now DNR, he will be set up with palliative care follow-up after discharge -CHF TOC follow-up arranged as well   Right upper lobe nodule -Suspect limited benefit in following this at his age   Essential hypertension -Stable, continue current meds   PAF (paroxysmal atrial fibrillation) (HCC) Rate control with amiodarone and anticoagulation with apixaban   HLD (hyperlipidemia) Continue with statin therapy.    Hypothyroidism On levothyroxine    Class 3 obesity (HCC) Calculated BMI is 40,3    Goals of care -Elderly 89/M with recurrent hospitalizations with CHF, seen by palliative care, now DNR, plan for palliative care follow-up after discharge   Consultations: Palliative care  Discharge Exam: Vitals:   03/22/22 0900 03/22/22 1000  BP:    Pulse: 73 73  Resp: 18 (!) 29  Temp:    SpO2: 95% 95%    General: AAOx3, no distress Cardiovascular: S1S2/RRR Respiratory: Coarse crackles bilaterally  Discharge Instructions   Discharge Instructions     Amb Referral to Palliative Care   Complete by: As directed    Diet - low sodium heart healthy   Complete by: As directed    Increase activity slowly   Complete by: As directed       Allergies as of 03/22/2022   No Known Allergies      Medication List     TAKE  these medications    amiodarone 100 MG tablet Commonly known as: PACERONE Take 1 tablet (100 mg total) by mouth daily. What changed: additional instructions   apixaban 2.5 MG Tabs tablet Commonly known as: Eliquis Take 1 tablet (2.5 mg total) by  mouth 2 (two) times daily.   cetirizine 10 MG tablet Commonly known as: ZYRTEC Take 10 mg by mouth daily as needed for allergies or rhinitis.   DRY EYES OP Apply 1 drop to eye 2 (two) times daily.   hydrALAZINE 50 MG tablet Commonly known as: APRESOLINE Take 1 tablet (50 mg total) by mouth 2 times daily at 12 noon and 4 pm.   Jardiance 10 MG Tabs tablet Generic drug: empagliflozin Take 1 tablet (10 mg total) by mouth daily.   levothyroxine 112 MCG tablet Commonly known as: SYNTHROID TAKE 1 TABLET BY MOUTH  DAILY BEFORE BREAKFAST   nitroGLYCERIN 0.4 MG SL tablet Commonly known as: NITROSTAT Place 1 tablet (0.4 mg total) under the tongue every 5 (five) minutes x 3 doses as needed for chest pain.   pantoprazole 40 MG tablet Commonly known as: PROTONIX Take 40 mg by mouth daily as needed (heartburn).   polyethylene glycol 17 g packet Commonly known as: MIRALAX / GLYCOLAX Take 17 g by mouth at bedtime.   potassium chloride SA 20 MEQ tablet Commonly known as: KLOR-CON M Take 1 tablet (20 mEq total) by mouth 2 (two) times daily. What changed:  how much to take when to take this   pravastatin 20 MG tablet Commonly known as: PRAVACHOL TAKE 1 TABLET BY MOUTH  DAILY   spironolactone 25 MG tablet Commonly known as: Aldactone Take 0.5 tablets (12.5 mg total) by mouth daily.   tamsulosin 0.4 MG Caps capsule Commonly known as: FLOMAX Take 0.4 mg by mouth at bedtime.   torsemide 20 MG tablet Commonly known as: DEMADEX Take 3 tablets (60 mg total) by mouth 2 (two) times daily.               Durable Medical Equipment  (From admission, onward)           Start     Ordered   03/20/22 1510  For home use only DME Bedside commode  Once       Question:  Patient needs a bedside commode to treat with the following condition  Answer:  Weakness   03/20/22 1509   03/20/22 1508  For home use only DME Hospital bed  Once       Question Answer Comment  Length of Need  Lifetime   Patient has (list medical condition): CHF   The above medical condition requires: Patient requires the ability to reposition frequently   Head must be elevated greater than: 30 degrees   Bed type Semi-electric   Support Surface: Gel Overlay      03/20/22 1508           No Known Allergies  Follow-up Information     [facility] Follow up.   Why: Continue home health services and outpatient palliative services Contact information: [place] [address] [place], [place] [facility] Follow up.   Why: hospital bed, BSC Contact information: [address] [place] [address] [phone]         [facility]. Go in 11 day(s).   Specialty: Cardiology Why: Hospital follow up PLEASE bring current medication list  to appointment FREE valet parking, Entrance C, Off of [employer] information: [address] [id number] [place] [phone]                 The results of significant diagnostics from this hospitalization (including imaging, microbiology, ancillary and laboratory) are listed below for reference.    Significant Diagnostic Studies: DG Chest 2 View  Result Date: 03/20/2022 CLINICAL DATA:  Dyspnea. EXAM: CHEST - 2 VIEW COMPARISON:  March 15, 2022 FINDINGS: Stable appearance of cardiac pacemaker and postsurgical changes from CABG and atrial appendage clipping. Enlarged cardiac silhouette. Persistent airspace opacity in the right upper lobe. Diffusely coarsened interstitial markings. Osseous structures are without acute abnormality. Soft tissues are grossly normal. IMPRESSION: 1. Persistent airspace opacity in the right upper lobe. 2. Diffusely coarsened interstitial markings. 3. Enlarged cardiac silhouette. Electronically Signed   By: Fidela Salisbury M.D.   On: 03/20/2022 10:17   DG Chest Portable 1  View  Result Date: 03/15/2022 CLINICAL DATA:  Shortness of breath EXAM: PORTABLE CHEST 1 VIEW COMPARISON:  Chest x-ray dated March 04, 2022 FINDINGS: Left chest wall dual lead pacer. Cardiac and mediastinal contours are unchanged post median sternotomy and CABG. Persistent focal consolidation of the mid right lung. Background lower lung predominant heterogeneous opacities are slightly increased when compared with prior exam. Trace right pleural effusion. No evidence of pneumothorax. IMPRESSION: 1. Persistent focal consolidation of the right mid lung, possibly due to infection. Recommend radiographic follow-up to ensure resolution. 2. Increased background heterogeneous opacities, likely due to worsening pulmonary edema. Electronically Signed   By: Yetta Glassman M.D.   On: 03/15/2022 14:54   ECHOCARDIOGRAM COMPLETE  Result Date: 03/05/2022    ECHOCARDIOGRAM REPORT   Patient Name:   OIVA DIBARI Date of Exam: 03/05/2022 Medical Rec #:  [id number]     Height:       69.0 in Accession #:    [id number]    Weight:       281.7 lb Date of Birth:  12-22-1928     BSA:          2.390 m Patient Age:    54 years      BP:           132/68 mmHg Patient Gender: M             HR:           75 bpm. Exam Location:  Inpatient Procedure: 2D Echo, Cardiac Doppler, Color Doppler and Intracardiac            Opacification Agent Indications:    CHF  History:        Patient has prior history of Echocardiogram examinations, most                 recent 03/15/2020. CAD, Prior CABG, Signs/Symptoms:Shortness of                 Breath; Risk Factors:Hypertension and Dyslipidemia.  Sonographer:    Merrie Roof RDCS Referring Phys: Karmen Bongo  Sonographer Comments: No subcostal window. IMPRESSIONS  1. Left ventricular ejection fraction, by estimation, is 50 to 55%. The left ventricle has low normal function. The left ventricle has no regional wall motion abnormalities. Left ventricular diastolic parameters were normal.  2. Right ventricular  systolic function is normal. The right ventricular size is mildly enlarged.  3. Right atrial size was moderately dilated.  4. The mitral valve is normal in structure. Trivial mitral valve  regurgitation. No evidence of mitral stenosis.  5. Tricuspid valve regurgitation is severe.  6. The aortic valve is normal in structure. Aortic valve regurgitation is not visualized. No aortic stenosis is present.  7. The inferior vena cava is normal in size with greater than 50% respiratory variability, suggesting right atrial pressure of 3 mmHg. FINDINGS  Left Ventricle: Left ventricular ejection fraction, by estimation, is 50 to 55%. The left ventricle has low normal function. The left ventricle has no regional wall motion abnormalities. Definity contrast agent was given IV to delineate the left ventricular endocardial borders. The left ventricular internal cavity size was normal in size. There is no left ventricular hypertrophy. Abnormal (paradoxical) septal motion, consistent with left bundle branch block. Left ventricular diastolic parameters  were normal.  LV Wall Scoring: The apical septal segment and apex are akinetic. Right Ventricle: The right ventricular size is mildly enlarged. No increase in right ventricular wall thickness. Right ventricular systolic function is normal. Left Atrium: Left atrial size was normal in size. Right Atrium: Right atrial size was moderately dilated. Pericardium: There is no evidence of pericardial effusion. Mitral Valve: The mitral valve is normal in structure. Mild mitral annular calcification. Trivial mitral valve regurgitation. No evidence of mitral valve stenosis. Tricuspid Valve: The tricuspid valve is normal in structure. Tricuspid valve regurgitation is severe. No evidence of tricuspid stenosis. Aortic Valve: The aortic valve is normal in structure. Aortic valve regurgitation is not visualized. No aortic stenosis is present. Aortic valve mean gradient measures 4.0 mmHg. Aortic valve  peak gradient measures 7.1 mmHg. Aortic valve area, by VTI measures 1.83 cm. Pulmonic Valve: The pulmonic valve was normal in structure. Pulmonic valve regurgitation is not visualized. No evidence of pulmonic stenosis. Aorta: The aortic root is normal in size and structure. Venous: The inferior vena cava is normal in size with greater than 50% respiratory variability, suggesting right atrial pressure of 3 mmHg. IAS/Shunts: There is redundancy of the interatrial septum. No atrial level shunt detected by color flow Doppler.  LEFT VENTRICLE PLAX 2D LVIDd:         5.40 cm   Diastology LVIDs:         3.90 cm   LV e' medial:    7.18 cm/s LV PW:         0.90 cm   LV E/e' medial:  14.6 LV IVS:        1.30 cm   LV e' lateral:   11.40 cm/s LVOT diam:     2.10 cm   LV E/e' lateral: 9.2 LV SV:         46 LV SV Index:   19 LVOT Area:     3.46 cm  RIGHT VENTRICLE RV Basal diam:  5.20 cm RV Mid diam:    5.30 cm RV S prime:     8.92 cm/s TAPSE (M-mode): 1.4 cm LEFT ATRIUM           Index        RIGHT ATRIUM           Index LA diam:      5.20 cm 2.18 cm/m   RA Area:     37.50 cm LA Vol (A4C): 71.5 ml 29.92 ml/m  RA Volume:   144.00 ml 60.25 ml/m  AORTIC VALVE AV Area (Vmax):    1.80 cm AV Area (Vmean):   1.67 cm AV Area (VTI):     1.83 cm AV Vmax:  133.00 cm/s AV Vmean:          91.100 cm/s AV VTI:            0.254 m AV Peak Grad:      7.1 mmHg AV Mean Grad:      4.0 mmHg LVOT Vmax:         69.10 cm/s LVOT Vmean:        43.900 cm/s LVOT VTI:          0.134 m LVOT/AV VTI ratio: 0.53  AORTA Ao Root diam: 3.10 cm MITRAL VALVE                TRICUSPID VALVE MV Area (PHT): 4.89 cm     TR Peak grad:   27.5 mmHg MV Decel Time: 155 msec     TR Vmax:        262.00 cm/s MV E velocity: 105.00 cm/s MV A velocity: 62.20 cm/s   SHUNTS MV E/A ratio:  1.69         Systemic VTI:  0.13 m                             Systemic Diam: 2.10 cm Candee Furbish MD Electronically signed by Candee Furbish MD Signature Date/Time: 03/05/2022/2:14:14 PM     Final    DG Chest Port 1 View  Result Date: 03/04/2022 CLINICAL DATA:  Respiratory distress. EXAM: PORTABLE CHEST 1 VIEW COMPARISON:  Chest radiograph 03/14/2020 and earlier FINDINGS: Left chest wall pacemaker with leads overlying the right atrium and right ventricle. Postoperative changes of median sternotomy and coronary artery bypass graft. Coronary artery stent. Left atrial appendage clip. Stable enlarged cardiac silhouette. Aortic calcifications. New nodular peripheral airspace opacity in the right mid lung. Unchanged bibasilar atelectasis. No pleural effusion or pneumothorax. Anterior cervical spinal fusion. IMPRESSION: New nodular peripheral airspace opacity in the right mid lung may represent infection in the appropriate clinical context versus new pulmonary nodule. Consider CT chest with intravenous contrast for further evaluation. Stable cardiomegaly.  Aortic Atherosclerosis (ICD10-I70.0). Electronically Signed   By: Ileana Roup M.D.   On: 03/04/2022 12:20    Microbiology: Recent Results (from the past 240 hour(s))  SARS Coronavirus 2 by RT PCR (hospital order, performed in [facility] hospital lab) *cepheid single result test* Anterior Nasal Swab     Status: None   Collection Time: 03/15/22  5:00 PM   Specimen: Anterior Nasal Swab  Result Value Ref Range Status   SARS Coronavirus 2 by RT PCR NEGATIVE NEGATIVE Final    Comment: (NOTE) SARS-CoV-2 target nucleic acids are NOT DETECTED.  The SARS-CoV-2 RNA is generally detectable in upper and lower respiratory specimens during the acute phase of infection. The lowest concentration of SARS-CoV-2 viral copies this assay can detect is 250 copies / mL. A negative result does not preclude SARS-CoV-2 infection and should not be used as the sole basis for treatment or other patient management decisions.  A negative result may occur with improper specimen collection / handling, submission of specimen other than nasopharyngeal swab, presence  of viral mutation(s) within the areas targeted by this assay, and inadequate number of viral copies (<250 copies / mL). A negative result must be combined with clinical observations, patient history, and epidemiological information.  Fact Sheet for Patients:   https://www.patel.info/  Fact Sheet for Healthcare Providers: https://hall.com/  This test is not yet approved or  cleared by the [place] and  has been authorized for detection and/or diagnosis of SARS-CoV-2 by FDA under an Emergency Use Authorization (EUA).  This EUA will remain in effect (meaning this test can be used) for the duration of the COVID-19 declaration under Section 564(b)(1) of the Act, 21 U.S.C. section 360bbb-3(b)(1), unless the authorization is terminated or revoked sooner.  Performed at [facility], [place] [address] [place], [place] [address]      Labs: Basic Metabolic Panel: Recent Labs  Lab 03/17/22 0425 03/18/22 0511 03/19/22 0411 03/20/22 0332 03/21/22 0241 03/22/22 0318  NA 135 139 138 138 139 138  K 3.7 3.6 3.7 4.0 3.9 3.6  CL 98 98 98 97* 98 98  CO2 27 29 29 29 30 29   GLUCOSE 97 102* 102* 107* 103* 103*  BUN 37* 41* 36* 35* 31* 37*  CREATININE 1.61* 1.86* 1.53* 1.63* 1.62* 1.51*  CALCIUM 8.7* 8.7* 8.8* 8.6* 8.7* 8.6*  MG 2.8*  --   --  2.7*  --   --    Liver Function Tests: Recent Labs  Lab 03/15/22 1417  AST 29  ALT 24  ALKPHOS 100  BILITOT 0.7  PROT 7.4  ALBUMIN 3.5   No results for input(s): "LIPASE", "AMYLASE" in the last 168 hours. No results for input(s): "AMMONIA" in the last 168 hours. CBC: Recent Labs  Lab 03/15/22 1417 03/16/22 0321 03/21/22 0241 03/22/22 0318  WBC 10.5 7.6 12.3* 10.3  NEUTROABS 8.5*  --   --   --   HGB 15.0 14.7 15.2 14.8  HCT 46.6 44.9 47.2 44.5  MCV 96.1 94.7 95.7 92.9  PLT 263 239 249 246   Cardiac Enzymes: No results for input(s): "CKTOTAL", "CKMB", "CKMBINDEX", "TROPONINI" in  the last 168 hours. BNP: BNP (last 3 results) Recent Labs    03/04/22 1157 03/15/22 1417  BNP 56.3 64.6    ProBNP (last 3 results) Recent Labs    05/11/21 1025  PROBNP 1,997*    CBG: No results for input(s): "GLUCAP" in the last 168 hours.     Signed:  Domenic Polite MD.  [facility] 03/22/2022, 1:33 PM

## 2022-03-22 NOTE — Plan of Care (Signed)
  Problem: Clinical Measurements: Goal: Will remain free from infection Outcome: Completed/Met   Problem: Nutrition: Goal: Adequate nutrition will be maintained Outcome: Completed/Met   Problem: Pain Managment: Goal: General experience of comfort will improve Outcome: Completed/Met   

## 2022-03-22 NOTE — TOC Transition Note (Signed)
Transition of Care [facility]) - CM/SW Discharge Note   Patient Details  Name: Levi [name] MRN: [id number] Date of Birth: [birth year]  Transition of Care [facility]) CM/SW Contact:  Zenon Mayo, RN Phone Number: 03/22/2022, 10:16 AM   Clinical Narrative:    Patient is for dc today, he will have out patient palliative service with  [facility].  Canceled it with [facility].  [facility] is providing [facility] services as well.  He has hospital bed and bsc that has been delivered to the home . Daughter is here to transport him home today.    Final next level of care: [place] Barriers to Discharge: Continued Medical Work up   Patient Goals and CMS Choice Patient states their goals for this hospitalization and ongoing recovery are:: return home CMS Medicare.gov Compare Post Acute Care list provided to:: Patient Represenative (must comment) Choice offered to / list presented to : Adult Children  Discharge Placement                       Discharge Plan and Services   Discharge Planning Services: CM Consult Post Acute Care Choice: Home Health, Durable Medical Equipment          DME Arranged: Hospital bed, Bedside commode DME Agency: [employer] Date DME Agency Contacted: 03/20/22 Time DME Agency Contacted: [phone] Representative spoke with at DME Agency: Jodell Cipro HH Arranged: RN, PT, OT [facility] Agency: [facility] Date [place]: 03/17/22 Time [place]: 1146 Representative spoke with at [place]: [place] Determinants of Health (SDOH) Interventions Food Insecurity Interventions: Intervention Not Indicated Housing Interventions: Intervention Not Indicated Transportation Interventions: Intervention Not Indicated   Readmission Risk Interventions     No data to display

## 2022-03-22 NOTE — Plan of Care (Signed)
  Problem: Education: Goal: Ability to demonstrate management of disease process will improve Outcome: Progressing Goal: Ability to verbalize understanding of medication therapies will improve Outcome: Progressing Goal: Individualized Educational Video(s) Outcome: Progressing   Problem: Activity: Goal: Capacity to carry out activities will improve Outcome: Progressing   Problem: Cardiac: Goal: Ability to achieve and maintain adequate cardiopulmonary perfusion will improve Outcome: Progressing   Problem: Education: Goal: Ability to demonstrate management of disease process will improve Outcome: Progressing Goal: Ability to verbalize understanding of medication therapies will improve Outcome: Progressing Goal: Individualized Educational Video(s) Outcome: Progressing   Problem: Activity: Goal: Capacity to carry out activities will improve Outcome: Progressing   Problem: Cardiac: Goal: Ability to achieve and maintain adequate cardiopulmonary perfusion will improve Outcome: Progressing   Problem: Education: Goal: Knowledge of General Education information will improve Description: Including pain rating scale, medication(s)/side effects and non-pharmacologic comfort measures Outcome: Progressing   Problem: Health Behavior/Discharge Planning: Goal: Ability to manage health-related needs will improve Outcome: Progressing   Problem: Clinical Measurements: Goal: Ability to maintain clinical measurements within normal limits will improve Outcome: Progressing Goal: Will remain free from infection Outcome: Progressing Goal: Diagnostic test results will improve Outcome: Progressing Goal: Respiratory complications will improve Outcome: Progressing Goal: Cardiovascular complication will be avoided Outcome: Progressing   Problem: Activity: Goal: Risk for activity intolerance will decrease Outcome: Progressing   Problem: Nutrition: Goal: Adequate nutrition will be  maintained Outcome: Progressing   Problem: Coping: Goal: Level of anxiety will decrease Outcome: Progressing   Problem: Elimination: Goal: Will not experience complications related to bowel motility Outcome: Progressing Goal: Will not experience complications related to urinary retention Outcome: Progressing   Problem: Elimination: Goal: Will not experience complications related to bowel motility Outcome: Progressing Goal: Will not experience complications related to urinary retention Outcome: Progressing   Problem: Pain Managment: Goal: General experience of comfort will improve Outcome: Progressing   Problem: Safety: Goal: Ability to remain free from injury will improve Outcome: Progressing   Problem: Skin Integrity: Goal: Risk for impaired skin integrity will decrease Outcome: Progressing   Problem: Education: Goal: Knowledge of General Education information will improve Description: Including pain rating scale, medication(s)/side effects and non-pharmacologic comfort measures Outcome: Progressing   Problem: Health Behavior/Discharge Planning: Goal: Ability to manage health-related needs will improve Outcome: Progressing   Problem: Clinical Measurements: Goal: Ability to maintain clinical measurements within normal limits will improve Outcome: Progressing Goal: Diagnostic test results will improve Outcome: Progressing Goal: Respiratory complications will improve Outcome: Progressing Goal: Cardiovascular complication will be avoided Outcome: Progressing   Problem: Coping: Goal: Level of anxiety will decrease Outcome: Progressing   Problem: Elimination: Goal: Will not experience complications related to bowel motility Outcome: Progressing Goal: Will not experience complications related to urinary retention Outcome: Progressing   Problem: Skin Integrity: Goal: Risk for impaired skin integrity will decrease Outcome: Progressing

## 2022-03-25 ENCOUNTER — Telehealth: Payer: Self-pay

## 2022-03-25 NOTE — Telephone Encounter (Signed)
error 

## 2022-03-27 ENCOUNTER — Ambulatory Visit (HOSPITAL_COMMUNITY)
Admission: RE | Admit: 2022-03-27 | Discharge: 2022-03-27 | Disposition: A | Discharge status: Home / Self Care | Payer: Medicare Other | Source: Ambulatory Visit | Attending: Cardiology | Admitting: Cardiology

## 2022-03-27 ENCOUNTER — Encounter (HOSPITAL_COMMUNITY): Payer: Medicare Other

## 2022-03-27 ENCOUNTER — Encounter (HOSPITAL_COMMUNITY): Payer: Self-pay

## 2022-03-27 ENCOUNTER — Telehealth (HOSPITAL_COMMUNITY): Payer: Self-pay | Admitting: Cardiology

## 2022-03-27 VITALS — BP 138/66 | HR 73 | Wt 271.0 lb

## 2022-03-27 DIAGNOSIS — Z955 Presence of coronary angioplasty implant and graft: Secondary | ICD-10-CM | POA: Diagnosis not present

## 2022-03-27 DIAGNOSIS — Z5181 Encounter for therapeutic drug level monitoring: Secondary | ICD-10-CM

## 2022-03-27 DIAGNOSIS — J9611 Chronic respiratory failure with hypoxia: Secondary | ICD-10-CM | POA: Diagnosis not present

## 2022-03-27 DIAGNOSIS — I480 Paroxysmal atrial fibrillation: Secondary | ICD-10-CM | POA: Diagnosis not present

## 2022-03-27 DIAGNOSIS — I5030 Unspecified diastolic (congestive) heart failure: Secondary | ICD-10-CM | POA: Diagnosis not present

## 2022-03-27 DIAGNOSIS — J439 Emphysema, unspecified: Secondary | ICD-10-CM | POA: Insufficient documentation

## 2022-03-27 DIAGNOSIS — Z7984 Long term (current) use of oral hypoglycemic drugs: Secondary | ICD-10-CM | POA: Insufficient documentation

## 2022-03-27 DIAGNOSIS — Z9981 Dependence on supplemental oxygen: Secondary | ICD-10-CM | POA: Insufficient documentation

## 2022-03-27 DIAGNOSIS — N183 Chronic kidney disease, stage 3 unspecified: Secondary | ICD-10-CM | POA: Diagnosis not present

## 2022-03-27 DIAGNOSIS — Z9989 Dependence on other enabling machines and devices: Secondary | ICD-10-CM | POA: Diagnosis not present

## 2022-03-27 DIAGNOSIS — G4733 Obstructive sleep apnea (adult) (pediatric): Secondary | ICD-10-CM | POA: Diagnosis not present

## 2022-03-27 DIAGNOSIS — I255 Ischemic cardiomyopathy: Secondary | ICD-10-CM

## 2022-03-27 DIAGNOSIS — I5042 Chronic combined systolic (congestive) and diastolic (congestive) heart failure: Secondary | ICD-10-CM | POA: Insufficient documentation

## 2022-03-27 DIAGNOSIS — E785 Hyperlipidemia, unspecified: Secondary | ICD-10-CM | POA: Diagnosis not present

## 2022-03-27 DIAGNOSIS — E1122 Type 2 diabetes mellitus with diabetic chronic kidney disease: Secondary | ICD-10-CM | POA: Diagnosis not present

## 2022-03-27 DIAGNOSIS — Z79899 Other long term (current) drug therapy: Secondary | ICD-10-CM | POA: Diagnosis not present

## 2022-03-27 DIAGNOSIS — Z951 Presence of aortocoronary bypass graft: Secondary | ICD-10-CM | POA: Diagnosis not present

## 2022-03-27 DIAGNOSIS — Z87891 Personal history of nicotine dependence: Secondary | ICD-10-CM | POA: Insufficient documentation

## 2022-03-27 DIAGNOSIS — I130 Hypertensive heart and chronic kidney disease with heart failure and stage 1 through stage 4 chronic kidney disease, or unspecified chronic kidney disease: Secondary | ICD-10-CM | POA: Insufficient documentation

## 2022-03-27 DIAGNOSIS — I2510 Atherosclerotic heart disease of native coronary artery without angina pectoris: Secondary | ICD-10-CM | POA: Diagnosis not present

## 2022-03-27 DIAGNOSIS — Z950 Presence of cardiac pacemaker: Secondary | ICD-10-CM | POA: Insufficient documentation

## 2022-03-27 LAB — BASIC METABOLIC PANEL
Anion gap: 8 (ref 5–15)
BUN: 32 mg/dL — ABNORMAL HIGH (ref 8–23)
CO2: 29 mmol/L (ref 22–32)
Calcium: 8.8 mg/dL — ABNORMAL LOW (ref 8.9–10.3)
Chloride: 97 mmol/L — ABNORMAL LOW (ref 98–111)
Creatinine, Ser: 1.71 mg/dL — ABNORMAL HIGH (ref 0.61–1.24)
GFR, Estimated: 37 mL/min — ABNORMAL LOW (ref >60)
Glucose, Bld: 106 mg/dL — ABNORMAL HIGH (ref 70–99)
Potassium: 4.5 mmol/L (ref 3.5–5.1)
Sodium: 134 mmol/L — ABNORMAL LOW (ref 135–145)

## 2022-03-27 LAB — BRAIN NATRIURETIC PEPTIDE: B Natriuretic Peptide: 62 pg/mL (ref 0.0–100.0)

## 2022-03-27 NOTE — Patient Instructions (Addendum)
It was great to see you today! No medication changes are needed at this time.   Labs today We will only contact you if something comes back abnormal or we need to make some changes. Otherwise no news is good news!  You have been referred to [facility] -they will be in contact with an appointment    Do the following things EVERYDAY: Weigh yourself in the morning before breakfast. Write it down and keep it in a log. Take your medicines as prescribed Eat low salt foods--Limit salt (sodium) to 2000 mg per day.  Stay as active as you can everyday Limit all fluids for the day to less than 2 liters'

## 2022-03-27 NOTE — Progress Notes (Addendum)
HEART & VASCULAR TRANSITION OF CARE CONSULT NOTE     Referring Physician: Dr. Broadus John  Primary Care: Bernerd Limbo, MD Primary Cardiologist: [name], MD   HPI: Referred to clinic by Dr. Broadus John for heart failure consultation.   88 y/o male w/ chronic combined systolic and diastolic heart failure, CAD s/p prior CABG in 2011 followed by LAD PCI in 2013, symptomatic bradycardia s/p PPM, PAF, HTN, HLD, Stage III CKD, OSA on CPAP, COPD and chronic hypoxic respiratory failure on home O2 (3L/min baseline).   CAD history: S/p CABG in 2011  S/p DES x 2 to LAD in 2013 LHC at [facility] in 6/18: patent LIMA-LAD, diff dz elsewhere.  All VGs occluded.  No targets for PCI >> medical Rx recommended    Echo 09/2015: EF 55-60% PPM implanted 10/2015 Echo 06/2016: EF 50-55% Echo 10/2017: EF 40-45%, RV nl Echo 03/2020: EF 45-50%, RV nl  Echo 03/2022: EF 50-55%, RV nl    Recent admit 7/23 for acute hypoxic respiratory failure. Multifactorial, secondary to CHF, and a component of COPD,?Pulmonary fibrosis (lower lung scarring noted on lung cuts of CT abdomen on 1/23 and chest x-rays).  Treated for COPDE and a/c CHF. O2 weaned down to 3 L which was his baseline oxygen requirement. Had considerable improvement with diuresis however, it was suspect pulmonary scarring/fibrosis and COPD was major contributors of his dyspnea and increased oxygen requirement. Echo showed EF 50-55%, RV nl and severe TR. Transitioned to PO torsemide after diuresis w/ IV Lasix.   Palliative care was also consulted to have a goals of care conversation in the setting of multiple chronic co-morbid conditions. Plan was to transition to home with OP Palliative support through [facility].  He presents to [facility] clinic today. Here w/ his son and daughter. In [place]. C/w supp O2 requirements. + increased WOB but appears euvolemic on exam. Wt only up 3 lb since discharge. Reports full compliance w/ home diuretics, on torsemide, Jardiance and spiro.  Of note, his BNP recent hospitalization was not elevated, 56.  He has also been on amiodarone long term and I worry about amiodarone toxicity. We re discussed GOC and he is scheduled to have his first visit from outpatient palliative care team tomorrow. He is agreeable to further w/u w/ labs (ESR) to screen for possible amiodarone toxicity, as treatment w/ steroids may potentially improve his breathing and help w/ palliation.   Current O2 sats 95% on 3L/[place].    Cardiac Testing   2D Echo 03/2022 Left ventricular ejection fraction, by estimation, is 50 to 55%. The left ventricle has low normal function. The left ventricle has no regional wall motion abnormalities. Left ventricular diastolic parameters were normal. 1. Right ventricular systolic function is normal. The right ventricular size is mildly enlarged. 2. 3. Right atrial size was moderately dilated. The mitral valve is normal in structure. Trivial mitral valve regurgitation. No evidence of mitral stenosis. 4. 5. Tricuspid valve regurgitation is severe. The aortic valve is normal in structure. Aortic valve regurgitation is not visualized. No aortic stenosis is present. 6. The inferior vena cava is normal in size with greater than 50% respiratory variability, suggesting right atrial pressure of 3 mmHg.   Review of Systems: [y] = yes, _0  = no   General: Weight gain _1 ; Weight loss _2 ; Anorexia _3 ; Fatigue _4 ; Fever _5 ; Chills _6 ; Weakness _7   Cardiac: Chest pain/pressure _8 ; Resting SOB [Y ]; Exertional SOB Y]; Orthopnea _9 ;  Pedal Edema _0 ; Palpitations _1 ; Syncope _2 ; Presyncope _3 ; Paroxysmal nocturnal dyspnea_4   Pulmonary: Cough _5 ; Wheezing[ Y]; Hemoptysis_6 ; Sputum _7 ; Snoring _8   GI: Vomiting_9 ; Dysphagia_10 ; Melena_11 ; Hematochezia _12 ; Heartburn_13 ; Abdominal pain _14 ; Constipation _15 ; Diarrhea _16 ; BRBPR _17   GU: Hematuria_18 ; Dysuria _19 ; Nocturia_20   Vascular: Pain in legs with walking _21 ; Pain in  feet with lying flat _22 ; Non-healing sores _23 ; Stroke _24 ; TIA _25 ; Slurred speech _26 ;  Neuro: Headaches_27 ; Vertigo_28 ; Seizures_29 ; Paresthesias_30 ;Blurred vision _31 ; Diplopia _32 ; Vision changes _33   Ortho/Skin: Arthritis _34 ; Joint pain _35 ; Muscle pain _36 ; Joint swelling _37 ; Back Pain _38 ; Rash _39   Psych: Depression_40 ; Anxiety_41   Heme: Bleeding problems _42 ; Clotting disorders _43 ; Anemia _44   Endocrine: Diabetes _45 ; Thyroid dysfunction_46    Past Medical History:  Diagnosis Date   Asthma    Benign neoplasm of colon    CAD (coronary artery disease) Dec 2011   s/p CABG per Dr. Roxy Manns; had normal EF; All SVGs occluded per follow up cath with only LIMA to LAD patent; s/p PCI of the proximal and mid LAD February 2014 per Dr. Burt Knack   Chronic combined systolic and diastolic CHF (congestive heart failure) ([place])    CKD (chronic kidney disease), stage II    Emphysema    "never treated for it" (10/05/2015)   History of hiatal hernia    HLD (hyperlipidemia)    HTN (hypertension)    Malaise and fatigue    Obesity    Osteopenia    PAF (paroxysmal atrial fibrillation) (HCC)    Pneumonia 1960s X 1; 1990s X 1   Presence of permanent cardiac pacemaker    Prostate cancer ([place])    Skin cancer    "face, nose, top of ears, scalp"   Symptomatic bradycardia    STJ PPM, 10/05/15, Dr. Lovena Le    Current Outpatient Medications  Medication Sig Dispense Refill   amiodarone (PACERONE) 100 MG tablet Take 1 tablet (100 mg total) by mouth daily. (Patient taking differently: Take 100 mg by mouth daily. Taking in the morning) 90 tablet 3   apixaban (ELIQUIS) 2.5 MG TABS tablet Take 1 tablet (2.5 mg total) by mouth 2 (two) times daily. 180 tablet 1   Artificial Tear Ointment (DRY EYES OP) Apply 1 drop to eye 2 (two) times daily.     cetirizine (ZYRTEC) 10 MG tablet Take 10 mg by mouth daily as needed for allergies or rhinitis.      empagliflozin (JARDIANCE) 10 MG TABS tablet Take 1 tablet (10 mg total)  by mouth daily. 30 tablet 1   hydrALAZINE (APRESOLINE) 50 MG tablet Take 1 tablet (50 mg total) by mouth 2 times daily at 12 noon and 4 pm. 60 tablet 1   levothyroxine (SYNTHROID) 112 MCG tablet TAKE 1 TABLET BY MOUTH  DAILY BEFORE BREAKFAST 90 tablet 3   nitroGLYCERIN (NITROSTAT) 0.4 MG SL tablet Place 1 tablet (0.4 mg total) under the tongue every 5 (five) minutes x 3 doses as needed for chest pain. 30 tablet 3   pantoprazole (PROTONIX) 40 MG tablet Take 40 mg by mouth daily as needed (heartburn).     polyethylene glycol (MIRALAX / GLYCOLAX) 17 g packet Take 17 g by mouth at bedtime.     potassium chloride  SA (KLOR-CON M) 20 MEQ tablet Take 1 tablet (20 mEq total) by mouth 2 (two) times daily.     pravastatin (PRAVACHOL) 20 MG tablet TAKE 1 TABLET BY MOUTH  DAILY (Patient taking differently: Take 20 mg by mouth daily.) 90 tablet 3   spironolactone (ALDACTONE) 25 MG tablet Take 0.5 tablets (12.5 mg total) by mouth daily. 45 tablet 3   tamsulosin (FLOMAX) 0.4 MG CAPS capsule Take 0.4 mg by mouth at bedtime.      torsemide (DEMADEX) 20 MG tablet Take 3 tablets (60 mg total) by mouth 2 (two) times daily. 540 tablet 3   No current facility-administered medications for this encounter.    No Known Allergies    Social History   Socioeconomic History   Marital status: Widowed    Spouse name: Not on file   Number of children: 4   Years of education: Not on file   Highest education level: 9th grade  Occupational History   Occupation: retired Information systems manager: RETIRED  Tobacco Use   Smoking status: Former    Packs/day: 1.00    Years: 30.00    Total pack years: 30.00    Types: Cigarettes    Quit date: 09/07/1975    Years since quitting: 46.5   Smokeless tobacco: Former    Types: Chew    Quit date: 07/27/2015  Vaping Use   Vaping Use: Never used  Substance and Sexual Activity   Alcohol use: No    Alcohol/week: 0.0 standard drinks of alcohol   Drug use: No   Sexual activity: Not  Currently  Other Topics Concern   Not on file  Social History Narrative   Not on file   Social Determinants of Health   Financial Resource Strain: Low Risk  (03/07/2022)   Overall Financial Resource Strain (CARDIA)    Difficulty of Paying Living Expenses: Not hard at all  Food Insecurity: No Food Insecurity (03/18/2022)   Hunger Vital Sign    Worried About Running Out of Food in the Last Year: Never true    [place] in the Last Year: Never true  Transportation Needs: No Transportation Needs (03/18/2022)   PRAPARE - Hydrologist (Medical): No    Lack of Transportation (Non-Medical): No  Physical Activity: Not on file  Stress: Not on file  Social Connections: Not on file  Intimate Partner Violence: Not on file      Family History  Problem Relation Age of Onset   Kidney cancer Father    Heart attack Father    Hypertension Mother    Stroke Mother    Heart disease Brother    Skin cancer Brother    Prostate cancer Brother    Allergic rhinitis Neg Hx    Asthma Neg Hx    Eczema Neg Hx    Urticaria Neg Hx     Vitals:   03/27/22 1212  BP: 138/66  Pulse: 73  SpO2: 95%  Weight: 122.9 kg (271 lb)    PHYSICAL EXAM: General:  elderly male, in WC, wearing Ada, slight increased WOB w/ conversation HEENT: normal Neck: supple. no JVD. Carotids 2+ bilat; no bruits. No lymphadenopathy or thryomegaly appreciated. Cor: PMI nondisplaced. Regular rate & rhythm. 2/6 TR murmur  Lungs: bibasilar crackles c/w ILD  Abdomen: soft, nontender, nondistended. No hepatosplenomegaly. No bruits or masses. Good bowel sounds. Extremities: no cyanosis, clubbing, rash, edema Neuro: alert & oriented x 3, cranial nerves grossly intact.  moves all 4 extremities w/o difficulty. Affect pleasant.  ECG: Not performed   ASSESSMENT & PLAN:  88 y/o male w/ h/o HFrEF>>HFimEF, chronic diastolic dysfunction, CAD s/p remote CABG and LAD PCI, PAF treated w/ long term amiodarone,  COPD, former smoker w/ recent hospitalization for acute hypoxic respiratory failure and new O2 requirements. Initially felt multifactorial, 2/2 COPDE and a/c CHF, though BNP was WNL at 56 pg/mL. He was also noted to have evidence of possible pulmonary fibrosis, noted on prior CT of abdomen 1/23 (lower lung scarring noted on lung cut).   Despite diuresis and euvolemic status, he c/w supp O2 requirements and increased WOB. Dyspnea out of proportion to degree of HF. Most recent Echo showed normalization of LVEF, 50-55%. RV nl.   I have high concern for possible amiodarone pulmonary toxicity in the setting of long term use. We re discussed GOC and he is scheduled to have his first visit from outpatient palliative care team tomorrow. However, he is agreeable to further w/u w/ labs (ESR) to screen for possible amiodarone toxicity, as treatment w/ steroids may potentially improve his breathing and help w/ palliation.   If ESR c/w toxicity, will stop amiodarone and plan course of steroids.   Will also plan for him to f/u w/ gen cards. Will arrange f/u with either Dr. Harrington Challenger of [facility], in 2 wks.   Will also check BMP to assess renal function/ K and f/u BNP to r/o interval change in baseline level.    NYHA III confounded by lung disease, COPD and likely pulmonary fibrosis  GDMT  Diuretic- Torsemide 60 mg bid  BB- No, avoiding in setting of underlying lung disease  Ace/ARB/ARNI No  MRA Spiro 12.5 mg daily  SGLT2i Jardiance 10 mg daily     Referred to HFSW (PCP, Medications, Transportation, ETOH Abuse, Drug Abuse, Insurance, Museum/gallery curator ): No Refer to Pharmacy: No  Refer to Home Health:  No Refer to Advanced Heart Failure Clinic: No Refer to General Cardiology: Yes (Dr. Ross/ Scott Weaver PA-C)   Follow up  in 2 wks w/ gen cards.

## 2022-03-27 NOTE — Telephone Encounter (Signed)
Sed rate not ran QNS with specimen sent to lab Order sent to [facility] home health/hospice P [phone] F [phone]

## 2022-03-29 ENCOUNTER — Telehealth (HOSPITAL_COMMUNITY): Payer: Self-pay | Admitting: Surgery

## 2022-03-29 MED ORDER — PREDNISONE 20 MG PO TABS: ORAL_TABLET | ORAL | 3 refills | Status: DC

## 2022-03-29 MED ORDER — PREDNISONE 20 MG PO TABS: ORAL_TABLET | ORAL | 0 refills | Status: DC

## 2022-03-29 NOTE — Telephone Encounter (Signed)
-----  Message from Consuelo Pandy, [place] sent at 03/29/2022 11:19 AM EDT ----- ESR elevated at 87.   Clinical picture highly concerning for amiodarone pulmonary toxicity.  He needs to stop amiodarone. Please place in allergy list. Start treatment w/ prednisone, 40 mg daily. Standard treatment for this is usually 2-3 months, depending on response and will need to be slowly weaned off. Let family know that I will notify cardiology as well, to inform Dr. Harrington Challenger' care team about tx recommendations. They can decide on additional evaluation/ pulmonary referral at f/u appt w/ [facility] Dopp in 2 wks.

## 2022-03-29 NOTE — Telephone Encounter (Signed)
I called and spoke with patient's daughter to review results and recommendations.  I have sent prescription to [facility] for 10 days of Prednisone and 3 months to [facility] per her request.  I also added Amiodarone to patients allergy list.

## 2022-04-02 ENCOUNTER — Encounter (HOSPITAL_COMMUNITY): Payer: Medicare Other

## 2022-04-04 ENCOUNTER — Encounter (HOSPITAL_COMMUNITY): Payer: Self-pay | Admitting: Emergency Medicine

## 2022-04-04 ENCOUNTER — Emergency Department (HOSPITAL_COMMUNITY): Payer: Medicare Other

## 2022-04-04 ENCOUNTER — Other Ambulatory Visit: Payer: Self-pay

## 2022-04-04 ENCOUNTER — Inpatient Hospital Stay (HOSPITAL_COMMUNITY)
Admission: EM | Admit: 2022-04-04 | Discharge: 2022-04-08 | DRG: 871 | Disposition: A | Discharge status: Hospice | Payer: Medicare Other | Attending: Internal Medicine | Admitting: Internal Medicine

## 2022-04-04 DIAGNOSIS — J439 Emphysema, unspecified: Secondary | ICD-10-CM | POA: Diagnosis present

## 2022-04-04 DIAGNOSIS — Z981 Arthrodesis status: Secondary | ICD-10-CM

## 2022-04-04 DIAGNOSIS — Z808 Family history of malignant neoplasm of other organs or systems: Secondary | ICD-10-CM

## 2022-04-04 DIAGNOSIS — K2100 Gastro-esophageal reflux disease with esophagitis, without bleeding: Secondary | ICD-10-CM | POA: Diagnosis present

## 2022-04-04 DIAGNOSIS — I130 Hypertensive heart and chronic kidney disease with heart failure and stage 1 through stage 4 chronic kidney disease, or unspecified chronic kidney disease: Secondary | ICD-10-CM | POA: Diagnosis present

## 2022-04-04 DIAGNOSIS — Z515 Encounter for palliative care: Secondary | ICD-10-CM | POA: Diagnosis not present

## 2022-04-04 DIAGNOSIS — R918 Other nonspecific abnormal finding of lung field: Secondary | ICD-10-CM

## 2022-04-04 DIAGNOSIS — Z8546 Personal history of malignant neoplasm of prostate: Secondary | ICD-10-CM | POA: Diagnosis not present

## 2022-04-04 DIAGNOSIS — Z888 Allergy status to other drugs, medicaments and biological substances status: Secondary | ICD-10-CM

## 2022-04-04 DIAGNOSIS — R0603 Acute respiratory distress: Principal | ICD-10-CM

## 2022-04-04 DIAGNOSIS — R54 Age-related physical debility: Secondary | ICD-10-CM | POA: Diagnosis present

## 2022-04-04 DIAGNOSIS — I5030 Unspecified diastolic (congestive) heart failure: Secondary | ICD-10-CM | POA: Diagnosis present

## 2022-04-04 DIAGNOSIS — J9621 Acute and chronic respiratory failure with hypoxia: Secondary | ICD-10-CM | POA: Diagnosis present

## 2022-04-04 DIAGNOSIS — Z20822 Contact with and (suspected) exposure to covid-19: Secondary | ICD-10-CM | POA: Diagnosis present

## 2022-04-04 DIAGNOSIS — Z9981 Dependence on supplemental oxygen: Secondary | ICD-10-CM | POA: Diagnosis not present

## 2022-04-04 DIAGNOSIS — J189 Pneumonia, unspecified organism: Secondary | ICD-10-CM | POA: Diagnosis present

## 2022-04-04 DIAGNOSIS — N179 Acute kidney failure, unspecified: Secondary | ICD-10-CM | POA: Diagnosis present

## 2022-04-04 DIAGNOSIS — I2510 Atherosclerotic heart disease of native coronary artery without angina pectoris: Secondary | ICD-10-CM | POA: Diagnosis present

## 2022-04-04 DIAGNOSIS — B9789 Other viral agents as the cause of diseases classified elsewhere: Secondary | ICD-10-CM | POA: Diagnosis present

## 2022-04-04 DIAGNOSIS — I4892 Unspecified atrial flutter: Secondary | ICD-10-CM | POA: Diagnosis present

## 2022-04-04 DIAGNOSIS — I4430 Unspecified atrioventricular block: Secondary | ICD-10-CM | POA: Diagnosis present

## 2022-04-04 DIAGNOSIS — N182 Chronic kidney disease, stage 2 (mild): Secondary | ICD-10-CM | POA: Diagnosis present

## 2022-04-04 DIAGNOSIS — R700 Elevated erythrocyte sedimentation rate: Secondary | ICD-10-CM | POA: Diagnosis present

## 2022-04-04 DIAGNOSIS — A419 Sepsis, unspecified organism: Principal | ICD-10-CM | POA: Diagnosis present

## 2022-04-04 DIAGNOSIS — H9190 Unspecified hearing loss, unspecified ear: Secondary | ICD-10-CM | POA: Diagnosis present

## 2022-04-04 DIAGNOSIS — H9193 Unspecified hearing loss, bilateral: Secondary | ICD-10-CM | POA: Diagnosis present

## 2022-04-04 DIAGNOSIS — I878 Other specified disorders of veins: Secondary | ICD-10-CM | POA: Diagnosis present

## 2022-04-04 DIAGNOSIS — Z85828 Personal history of other malignant neoplasm of skin: Secondary | ICD-10-CM

## 2022-04-04 DIAGNOSIS — R590 Localized enlarged lymph nodes: Secondary | ICD-10-CM

## 2022-04-04 DIAGNOSIS — Z8042 Family history of malignant neoplasm of prostate: Secondary | ICD-10-CM

## 2022-04-04 DIAGNOSIS — I5033 Acute on chronic diastolic (congestive) heart failure: Secondary | ICD-10-CM | POA: Diagnosis present

## 2022-04-04 DIAGNOSIS — Z951 Presence of aortocoronary bypass graft: Secondary | ICD-10-CM

## 2022-04-04 DIAGNOSIS — Z8249 Family history of ischemic heart disease and other diseases of the circulatory system: Secondary | ICD-10-CM

## 2022-04-04 DIAGNOSIS — Z950 Presence of cardiac pacemaker: Secondary | ICD-10-CM

## 2022-04-04 DIAGNOSIS — E039 Hypothyroidism, unspecified: Secondary | ICD-10-CM | POA: Diagnosis present

## 2022-04-04 DIAGNOSIS — Z87891 Personal history of nicotine dependence: Secondary | ICD-10-CM

## 2022-04-04 DIAGNOSIS — Z66 Do not resuscitate: Secondary | ICD-10-CM | POA: Diagnosis present

## 2022-04-04 DIAGNOSIS — Z8719 Personal history of other diseases of the digestive system: Secondary | ICD-10-CM

## 2022-04-04 DIAGNOSIS — Z79890 Hormone replacement therapy (postmenopausal): Secondary | ICD-10-CM

## 2022-04-04 DIAGNOSIS — Z8051 Family history of malignant neoplasm of kidney: Secondary | ICD-10-CM

## 2022-04-04 DIAGNOSIS — Z9841 Cataract extraction status, right eye: Secondary | ICD-10-CM

## 2022-04-04 DIAGNOSIS — G4733 Obstructive sleep apnea (adult) (pediatric): Secondary | ICD-10-CM | POA: Diagnosis present

## 2022-04-04 DIAGNOSIS — Z6841 Body Mass Index (BMI) 40.0 and over, adult: Secondary | ICD-10-CM

## 2022-04-04 DIAGNOSIS — I480 Paroxysmal atrial fibrillation: Secondary | ICD-10-CM | POA: Diagnosis present

## 2022-04-04 DIAGNOSIS — E662 Morbid (severe) obesity with alveolar hypoventilation: Secondary | ICD-10-CM | POA: Diagnosis present

## 2022-04-04 DIAGNOSIS — Z823 Family history of stroke: Secondary | ICD-10-CM

## 2022-04-04 DIAGNOSIS — E785 Hyperlipidemia, unspecified: Secondary | ICD-10-CM | POA: Diagnosis present

## 2022-04-04 DIAGNOSIS — Z86010 Personal history of colonic polyps: Secondary | ICD-10-CM

## 2022-04-04 DIAGNOSIS — Z79899 Other long term (current) drug therapy: Secondary | ICD-10-CM

## 2022-04-04 DIAGNOSIS — C3490 Malignant neoplasm of unspecified part of unspecified bronchus or lung: Secondary | ICD-10-CM | POA: Diagnosis present

## 2022-04-04 DIAGNOSIS — R778 Other specified abnormalities of plasma proteins: Secondary | ICD-10-CM | POA: Diagnosis present

## 2022-04-04 DIAGNOSIS — Z7901 Long term (current) use of anticoagulants: Secondary | ICD-10-CM

## 2022-04-04 DIAGNOSIS — Z955 Presence of coronary angioplasty implant and graft: Secondary | ICD-10-CM

## 2022-04-04 DIAGNOSIS — Z7952 Long term (current) use of systemic steroids: Secondary | ICD-10-CM

## 2022-04-04 DIAGNOSIS — I10 Essential (primary) hypertension: Secondary | ICD-10-CM | POA: Diagnosis present

## 2022-04-04 LAB — COMPREHENSIVE METABOLIC PANEL
ALT: 36 U/L (ref 0–44)
AST: 39 U/L (ref 15–41)
Albumin: 3.3 g/dL — ABNORMAL LOW (ref 3.5–5.0)
Alkaline Phosphatase: 95 U/L (ref 38–126)
Anion gap: 14 (ref 5–15)
BUN: 40 mg/dL — ABNORMAL HIGH (ref 8–23)
CO2: 28 mmol/L (ref 22–32)
Calcium: 8.8 mg/dL — ABNORMAL LOW (ref 8.9–10.3)
Chloride: 95 mmol/L — ABNORMAL LOW (ref 98–111)
Creatinine, Ser: 1.88 mg/dL — ABNORMAL HIGH (ref 0.61–1.24)
GFR, Estimated: 33 mL/min — ABNORMAL LOW (ref >60)
Glucose, Bld: 169 mg/dL — ABNORMAL HIGH (ref 70–99)
Potassium: 3.8 mmol/L (ref 3.5–5.1)
Sodium: 137 mmol/L (ref 135–145)
Total Bilirubin: 0.9 mg/dL (ref 0.3–1.2)
Total Protein: 6.9 g/dL (ref 6.5–8.1)

## 2022-04-04 LAB — RESPIRATORY PANEL BY PCR

## 2022-04-04 LAB — I-STAT VENOUS BLOOD GAS, ED
Acid-Base Excess: 8 mmol/L — ABNORMAL HIGH (ref 0.0–2.0)
Bicarbonate: 34 mmol/L — ABNORMAL HIGH (ref 20.0–28.0)
Calcium, Ion: 1.03 mmol/L — ABNORMAL LOW (ref 1.15–1.40)
HCT: 48 % (ref 39.0–52.0)
Hemoglobin: 16.3 g/dL (ref 13.0–17.0)
O2 Saturation: 99 %
Potassium: 3.7 mmol/L (ref 3.5–5.1)
Sodium: 136 mmol/L (ref 135–145)
TCO2: 36 mmol/L — ABNORMAL HIGH (ref 22–32)
pCO2, Ven: 49.9 mmHg (ref 44–60)
pH, Ven: 7.441 — ABNORMAL HIGH (ref 7.25–7.43)
pO2, Ven: 122 mmHg — ABNORMAL HIGH (ref 32–45)

## 2022-04-04 LAB — STREP PNEUMONIAE URINARY ANTIGEN: Strep Pneumo Urinary Antigen: NEGATIVE

## 2022-04-04 LAB — URINALYSIS, ROUTINE W REFLEX MICROSCOPIC
Bacteria, UA: NONE SEEN
Bilirubin Urine: NEGATIVE
Glucose, UA: >=500 mg/dL — AB
Hgb urine dipstick: NEGATIVE
Ketones, ur: NEGATIVE mg/dL
Leukocytes,Ua: NEGATIVE
Nitrite: NEGATIVE
Protein, ur: NEGATIVE mg/dL
Specific Gravity, Urine: 1.018 (ref 1.005–1.030)
pH: 5 (ref 5.0–8.0)

## 2022-04-04 LAB — TROPONIN I (HIGH SENSITIVITY)
Troponin I (High Sensitivity): 33 ng/L — ABNORMAL HIGH (ref <18)
Troponin I (High Sensitivity): 28 ng/L — ABNORMAL HIGH (ref <18)

## 2022-04-04 LAB — RESP PANEL BY RT-PCR (FLU A&B, COVID) ARPGX2
Influenza A by PCR: NEGATIVE
Influenza B by PCR: NEGATIVE
SARS Coronavirus 2 by RT PCR: NEGATIVE

## 2022-04-04 LAB — CBC
HCT: 47.9 % (ref 39.0–52.0)
Hemoglobin: 15.6 g/dL (ref 13.0–17.0)
MCH: 31 pg (ref 26.0–34.0)
MCHC: 32.6 g/dL (ref 30.0–36.0)
MCV: 95.2 fL (ref 80.0–100.0)
Platelets: 249 10*3/uL (ref 150–400)
RBC: 5.03 MIL/uL (ref 4.22–5.81)
RDW: 15.1 % (ref 11.5–15.5)
WBC: 16.4 10*3/uL — ABNORMAL HIGH (ref 4.0–10.5)
nRBC: 0 % (ref 0.0–0.2)

## 2022-04-04 LAB — PROCALCITONIN: Procalcitonin: 2.25 ng/mL

## 2022-04-04 LAB — BRAIN NATRIURETIC PEPTIDE: B Natriuretic Peptide: 84.5 pg/mL (ref 0.0–100.0)

## 2022-04-04 MED ORDER — IPRATROPIUM-ALBUTEROL 0.5-2.5 (3) MG/3ML IN SOLN
3.0000 mL | RESPIRATORY_TRACT | Status: AC
  Administered 2022-04-04 (×3): 3 mL via RESPIRATORY_TRACT
  Filled 2022-04-04: qty 9

## 2022-04-04 MED ORDER — NITROGLYCERIN 0.4 MG SL SUBL: 0.4000 mg | SUBLINGUAL_TABLET | SUBLINGUAL | Status: DC | PRN

## 2022-04-04 MED ORDER — PANTOPRAZOLE SODIUM 40 MG PO TBEC: 40.0000 mg | DELAYED_RELEASE_TABLET | Freq: Every day | ORAL | Status: DC | PRN

## 2022-04-04 MED ORDER — METHYLPREDNISOLONE SODIUM SUCC 125 MG IJ SOLR
125.0000 mg | Freq: Once | INTRAMUSCULAR | Status: AC
  Administered 2022-04-04: 125 mg via INTRAVENOUS
  Filled 2022-04-04: qty 2

## 2022-04-04 MED ORDER — SODIUM CHLORIDE 0.9 % IV SOLN
500.0000 mg | INTRAVENOUS | Status: DC
  Administered 2022-04-05 – 2022-04-06 (×2): 500 mg via INTRAVENOUS
  Filled 2022-04-04 (×5): qty 5

## 2022-04-04 MED ORDER — SODIUM CHLORIDE 0.9% FLUSH: 3.0000 mL | INTRAVENOUS | Status: DC | PRN

## 2022-04-04 MED ORDER — BENZONATATE 100 MG PO CAPS
200.0000 mg | ORAL_CAPSULE | Freq: Three times a day (TID) | ORAL | Status: DC | PRN
  Administered 2022-04-04: 200 mg via ORAL
  Filled 2022-04-04: qty 2

## 2022-04-04 MED ORDER — ALBUTEROL SULFATE (2.5 MG/3ML) 0.083% IN NEBU
2.5000 mg | INHALATION_SOLUTION | RESPIRATORY_TRACT | Status: DC | PRN
  Administered 2022-04-06 – 2022-04-07 (×4): 2.5 mg via RESPIRATORY_TRACT
  Filled 2022-04-04 (×4): qty 3

## 2022-04-04 MED ORDER — LEVOTHYROXINE SODIUM 112 MCG PO TABS
112.0000 ug | ORAL_TABLET | Freq: Every day | ORAL | Status: DC
  Administered 2022-04-05 – 2022-04-08 (×4): 112 ug via ORAL
  Filled 2022-04-04 (×4): qty 1

## 2022-04-04 MED ORDER — GUAIFENESIN ER 600 MG PO TB12
600.0000 mg | ORAL_TABLET | Freq: Two times a day (BID) | ORAL | Status: DC | PRN
  Administered 2022-04-04: 600 mg via ORAL
  Filled 2022-04-04: qty 1

## 2022-04-04 MED ORDER — SODIUM CHLORIDE 0.9 % IV SOLN
1.0000 g | Freq: Once | INTRAVENOUS | Status: AC
  Administered 2022-04-04: 1 g via INTRAVENOUS
  Filled 2022-04-04: qty 10

## 2022-04-04 MED ORDER — ACETAMINOPHEN 325 MG PO TABS: 650.0000 mg | ORAL_TABLET | Freq: Four times a day (QID) | ORAL | Status: DC | PRN

## 2022-04-04 MED ORDER — SODIUM CHLORIDE 0.9% FLUSH
3.0000 mL | Freq: Two times a day (BID) | INTRAVENOUS | Status: DC
  Administered 2022-04-04 – 2022-04-08 (×8): 3 mL via INTRAVENOUS

## 2022-04-04 MED ORDER — SODIUM CHLORIDE 0.9 % IV SOLN: 250.0000 mL | INTRAVENOUS | Status: DC | PRN

## 2022-04-04 MED ORDER — IPRATROPIUM-ALBUTEROL 0.5-2.5 (3) MG/3ML IN SOLN
3.0000 mL | Freq: Four times a day (QID) | RESPIRATORY_TRACT | Status: AC
  Administered 2022-04-04 – 2022-04-05 (×4): 3 mL via RESPIRATORY_TRACT
  Filled 2022-04-04 (×4): qty 3

## 2022-04-04 MED ORDER — TAMSULOSIN HCL 0.4 MG PO CAPS
0.4000 mg | ORAL_CAPSULE | Freq: Every day | ORAL | Status: DC
  Administered 2022-04-04 – 2022-04-07 (×4): 0.4 mg via ORAL
  Filled 2022-04-04 (×4): qty 1

## 2022-04-04 MED ORDER — METHYLPREDNISOLONE SODIUM SUCC 125 MG IJ SOLR: 125.0000 mg | Freq: Every day | INTRAMUSCULAR | Status: DC

## 2022-04-04 MED ORDER — METHYLPREDNISOLONE SODIUM SUCC 125 MG IJ SOLR
125.0000 mg | INTRAMUSCULAR | Status: DC
  Administered 2022-04-05: 125 mg via INTRAVENOUS
  Filled 2022-04-04: qty 2

## 2022-04-04 MED ORDER — IOHEXOL 350 MG/ML SOLN
75.0000 mL | Freq: Once | INTRAVENOUS | Status: AC | PRN
  Administered 2022-04-04: 75 mL via INTRAVENOUS

## 2022-04-04 MED ORDER — SODIUM CHLORIDE 0.9 % IV SOLN
1.0000 g | INTRAVENOUS | Status: DC
  Administered 2022-04-05 – 2022-04-07 (×3): 1 g via INTRAVENOUS
  Filled 2022-04-04 (×3): qty 10

## 2022-04-04 MED ORDER — PRAVASTATIN SODIUM 10 MG PO TABS
20.0000 mg | ORAL_TABLET | Freq: Every day | ORAL | Status: DC
  Administered 2022-04-05 – 2022-04-08 (×4): 20 mg via ORAL
  Filled 2022-04-04 (×4): qty 2

## 2022-04-04 MED ORDER — HYDRALAZINE HCL 50 MG PO TABS: 50.0000 mg | ORAL_TABLET | Freq: Two times a day (BID) | ORAL | Status: DC

## 2022-04-04 MED ORDER — APIXABAN 2.5 MG PO TABS
2.5000 mg | ORAL_TABLET | Freq: Two times a day (BID) | ORAL | Status: DC
  Administered 2022-04-04 – 2022-04-08 (×8): 2.5 mg via ORAL
  Filled 2022-04-04 (×8): qty 1

## 2022-04-04 MED ORDER — SODIUM CHLORIDE 0.9 % IV SOLN
500.0000 mg | Freq: Once | INTRAVENOUS | Status: AC
  Administered 2022-04-04: 500 mg via INTRAVENOUS
  Filled 2022-04-04: qty 5

## 2022-04-04 MED ORDER — ACETAMINOPHEN 650 MG RE SUPP: 650.0000 mg | Freq: Four times a day (QID) | RECTAL | Status: DC | PRN

## 2022-04-04 NOTE — Assessment & Plan Note (Addendum)
Troponin flat and down trending with no chest pain not consistent with ACS Secondary to demand ischemia in setting of acute on chronic respiratory failure Continue medical management with pravachol, eliquis, imdur

## 2022-04-04 NOTE — Assessment & Plan Note (Signed)
Concern for primary lung cancer Have asked pulmonology to see as they would like to know options about biopsy Continue eliquis for now as he is not medically stable for a biopsy  Discussed with patient and family and will have palliative care see as well for possible comfort care  Likely contributing to #1

## 2022-04-04 NOTE — Consult Note (Signed)
Consultation Note Date: 04/04/2022   Patient Name: Levi [name]  DOB: 03-09-1929  MRN: [id number]  Age / Sex: 88 y.o., male  PCP: Bernerd Limbo, MD Referring Physician: Orma Flaming, MD  Reason for Consultation: Establishing goals of care  HPI/Patient Profile: 88 y.o. male  with past medical history of  CAD s/p CABG; chronic combined CHF; COPD; HTN; HLD; morbid obesity; afib; pacemaker placement; and prostate CA admitted on 04/04/2022 with shortness of breath.   Patient with 3 admissions in the past 6 months.  Currently admitted for acute on chronic respiratory failure, sepsis due to pneumonia.  Also with right lung mass concerning for lung cancer.  PMT has been consulted to assist with goals of care conversation.  Clinical Assessment and Goals of Care:  I have reviewed medical records including EPIC notes, labs and imaging, received report from RN, assessed the patient and then met at the bedside with patient's sons Louie Casa and Zenia Resides to discuss diagnosis prognosis, GOC, EOL wishes, disposition and options.  I introduced Palliative Medicine as specialized medical care for people living with serious illness. It focuses on providing relief from the symptoms and stress of a serious illness. The goal is to improve quality of life for both the patient and the family.  We discussed a brief life review of the patient and then focused on their current illness.   I attempted to elicit values and goals of care important to the patient.    Medical History Review and Understanding:  We discussed patient's medical history including CHF, COPD, recurrent hospitalizations due to respiratory failure, and now right lung mass that is likely cancer.  Patient and his sons understand the severity of his illness.  Social History: Patient is widowed.  He has 4 children -1 daughter and 2 sons locally, 1 son lives a few hours away.   Reviewed other social history per previous palliative medicine notes.  Functional and Nutritional State: Konrad Dolores shares that he has been caring for himself fairly well until the past month.  He is now limited by his weakness and shortness of breath with exertion.  He has had to stop driving during this past month as well.  His appetite is still good.  Palliative Symptoms: Dyspnea, cough  Advance Directives: A detailed discussion regarding advanced directives was had.  No HCPOA on file.   Code Status: Concepts specific to code status, artifical feeding and hydration, and rehospitalization were considered and discussed.  Patient confirms DNR order.  Discussion: Patient is familiar with palliative team from previous hospital stays and is appreciative of support.  We reviewed the ED work-up and next steps of his care including treatment of his pneumonia, possible bronchoscopy for biopsy of his lung mass next week if he can tolerate after recovery from pneumonia.  We discussed the risk involved with this procedure, particularly with his age and comorbidities, and I shared my concern that he may be difficult to extubate after bronchoscopy.  Counseled patient and family that his recent functional decline will likely make him a poor candidate for further treatment such as chemotherapy, radiation, immunotherapy.  Encouraged him to reflect on whether he is willing to accept these risks for the knowledge of a specific diagnosis or whether he wishes to focus on his comfort, peace, and dignity for his remaining time.  Comfort care and hospice philosophy were explained and offered.  Patient states that he has always felt "God will take me when it is my time, but I also want to  live as long as they are able to keep me alive."  He hopes to have some improvement to be able to resume the quality of life he had a few weeks ago.  We discussed continuing the current care plan and arranging next steps based on how his  symptoms progress.   The difference between aggressive medical intervention and comfort care was considered in light of the patient's goals of care. Hospice and Palliative Care services outpatient were explained and offered.   Discussed the importance of continued conversation with family and the medical providers regarding overall plan of care and treatment options, ensuring decisions are within the context of the patient's values and GOCs.   Questions and concerns were addressed.  Hard Choices booklet left for review. The family was encouraged to call with questions or concerns.  PMT will continue to support holistically.   SUMMARY OF RECOMMENDATIONS   -DNR confirmed -Continue current care, patient is hopeful for recovery from acute illness and desires to pursue further diagnostics in regards to his lung mass -Ongoing goals of care discussions based on clinical course -Psychosocial emotional support provided -PMT will continue to follow and support  Prognosis:  Poor prognosis given advanced age, multiple chronic comorbidities, and right lung mass concerning for cancer with involvement of mediastinal lymph nodes  Discharge Planning: To Be Determined      Primary Diagnoses: Present on Admission:  Acute on chronic respiratory failure with hypoxia (HCC)  Essential hypertension  PAF (paroxysmal atrial fibrillation) (HCC)  HLD (hyperlipidemia)  Heart failure with preserved ejection fraction (HCC)  OSA (obstructive sleep apnea)  Hypothyroidism    Physical Exam Vitals and nursing note reviewed.  Constitutional:      General: He is not in acute distress.    Appearance: He is ill-appearing.  Cardiovascular:     Rate and Rhythm: Normal rate.  Pulmonary:     Effort: Tachypnea present.  Skin:    General: Skin is warm and dry.  Neurological:     Mental Status: He is alert and oriented to person, place, and time.  Psychiatric:        Mood and Affect: Mood normal.         Behavior: Behavior normal.     Vital Signs: BP 125/83   Pulse 75   Temp 98.3 F (36.8 C) (Axillary)   Resp (!) 28   Ht '5\' 9"'  (1.753 m)   Wt 122.9 kg   SpO2 96%   BMI 40.01 kg/m  Pain Scale: 0-10   Pain Score: 3    SpO2: SpO2: 96 % O2 Device:SpO2: 96 % O2 Flow Rate: .O2 Flow Rate (L/min): 5 L/min   Palliative Assessment/Data:     MDM: High   Vickye Astorino Johnnette Litter, PA-C  Palliative Medicine Team Team phone # [phone]  Thank you for allowing the Palliative Medicine Team to assist in the care of this patient. Please utilize secure chat with additional questions, if there is no response within 30 minutes please call the above phone number.  Palliative Medicine Team providers are available by phone from 7am to 7pm daily and can be reached through the team cell phone.  Should this patient require assistance outside of these hours, please call the patient's attending physician.

## 2022-04-04 NOTE — ED Provider Notes (Signed)
[facility] EMERGENCY DEPARTMENT Provider Note   CSN: [id number] Arrival date & time: 04/04/22  0626     History  Chief Complaint  Patient presents with   Shortness of Breath    Levi [name] is a 88 y.o. male with past medical history of LAD diastolic CHF, COPD, obesity, hypothyroidism, hyperlipidemia, paroxysmal atrial fibrillation who presents to the emergency department in respiratory distress.  Patient states that he started feeling short of breath 2 to 3 days prior and had significantly worsened this morning.  He has no associated chest pain.  Has had more swelling to the bilateral lower extremities over this timeframe.  States that his right lower extremity is always slightly larger than his left lower extremity.  Patient's wife states that he has gained 5 pounds overnight.  He has been compliant with Lasix and states that "I took 3 of them this morning."  States that he has had a cough over the past several days that is worsened from baseline.  Patient denies fevers or chills  Shortness of Breath Associated symptoms: no abdominal pain, no chest pain, no cough, no ear pain, no fever, no rash, no sore throat and no vomiting        Home Medications Prior to Admission medications   Medication Sig Start Date End Date Taking? Authorizing Provider  apixaban (ELIQUIS) 2.5 MG TABS tablet Take 1 tablet (2.5 mg total) by mouth 2 (two) times daily. 03/12/22   [name], MD  Artificial Tear Ointment (DRY EYES OP) Apply 1 drop to eye 2 (two) times daily.    [provider]  cetirizine (ZYRTEC) 10 MG tablet Take 10 mg by mouth daily as needed for allergies or rhinitis.     [provider]  empagliflozin (JARDIANCE) 10 MG TABS tablet Take 1 tablet (10 mg total) by mouth daily. 03/09/22   Shelly Coss, MD  hydrALAZINE (APRESOLINE) 50 MG tablet Take 1 tablet (50 mg total) by mouth 2 times daily at 12 noon and 4 pm. 03/09/22   Shelly Coss, MD   levothyroxine (SYNTHROID) 112 MCG tablet TAKE 1 TABLET BY MOUTH  DAILY BEFORE BREAKFAST 11/30/21   [name], MD  nitroGLYCERIN (NITROSTAT) 0.4 MG SL tablet Place 1 tablet (0.4 mg total) under the tongue every 5 (five) minutes x 3 doses as needed for chest pain. 03/22/20   Richardson Dopp T, PA-C  pantoprazole (PROTONIX) 40 MG tablet Take 40 mg by mouth daily as needed (heartburn).    [provider]  polyethylene glycol (MIRALAX / GLYCOLAX) 17 g packet Take 17 g by mouth at bedtime.    [provider]  potassium chloride SA (KLOR-CON M) 20 MEQ tablet Take 1 tablet (20 mEq total) by mouth 2 (two) times daily. 03/22/22   Domenic Polite, MD  pravastatin (PRAVACHOL) 20 MG tablet TAKE 1 TABLET BY MOUTH  DAILY Patient taking differently: Take 20 mg by mouth daily. 08/31/21   [name], MD  predniSONE (DELTASONE) 20 MG tablet Take 40 mg daily 03/29/22   Lyda Jester M, PA-C  spironolactone (ALDACTONE) 25 MG tablet Take 0.5 tablets (12.5 mg total) by mouth daily. 06/14/21   Richardson Dopp T, PA-C  tamsulosin (FLOMAX) 0.4 MG CAPS capsule Take 0.4 mg by mouth at bedtime.     [provider]  torsemide (DEMADEX) 20 MG tablet Take 3 tablets (60 mg total) by mouth 2 (two) times daily. 05/15/21   [name], MD  Allergies    Amiodarone    Review of Systems   Review of Systems  Constitutional:  Negative for chills and fever.  HENT:  Negative for ear pain and sore throat.   Eyes:  Negative for pain and visual disturbance.  Respiratory:  Positive for shortness of breath. Negative for cough.   Cardiovascular:  Positive for leg swelling. Negative for chest pain and palpitations.  Gastrointestinal:  Negative for abdominal pain and vomiting.  Genitourinary:  Negative for dysuria and hematuria.  Musculoskeletal:  Negative for arthralgias and back pain.  Skin:  Negative for color change and rash.  Neurological:  Negative for seizures and syncope.  All other systems  reviewed and are negative.   Physical Exam Updated Vital Signs BP (!) 152/73 (BP Location: Right Arm)   Pulse 74   Temp (!) 97.5 F (36.4 C) (Oral)   Resp (!) 26   Ht 5\' 9"  (1.753 m)   Wt 122.9 kg   SpO2 (!) 87% Comment: Pt put on nasal cannula right after  BMI 40.01 kg/m  Physical Exam Vitals and nursing note reviewed.  Constitutional:      General: He is not in acute distress.    Appearance: He is well-developed. He is ill-appearing.     Comments: Upon EMS arrival the patient is sitting upright, tachypneic, ill-appearing.  HENT:     Head: Normocephalic and atraumatic.  Eyes:     Conjunctiva/sclera: Conjunctivae normal.  Cardiovascular:     Rate and Rhythm: Normal rate and regular rhythm.     Heart sounds: No murmur heard. Pulmonary:     Effort: Respiratory distress present.     Breath sounds: Wheezing and rales present.     Comments: Patient is speaking in 1-2 words only at a time.  He is tachypneic to the 40s and 50s, saturating 83% on room air, he is in moderate respiratory distress.  Bibasilar rales appreciated.  Diffuse inspiratory expiratory wheezes with moderate air movement globally. Abdominal:     Palpations: Abdomen is soft.     Tenderness: There is no abdominal tenderness.     Comments: Abdomen is distended which patient reports is at baseline.  No tenderness.  Soft  Musculoskeletal:        General: Swelling present.     Cervical back: Neck supple.  Skin:    General: Skin is warm and dry.     Capillary Refill: Capillary refill takes less than 2 seconds.  Neurological:     Mental Status: He is alert.     Comments: Patient is fully alert and oriented moving all extremity spontaneously grossly neurologically intact.  Psychiatric:        Mood and Affect: Mood normal.     ED Results / Procedures / Treatments   Labs (all labs ordered are listed, but only abnormal results are displayed) Labs Reviewed  RESP PANEL BY RT-PCR (FLU A&B, COVID) [id number]   COMPREHENSIVE METABOLIC PANEL  CBC  TROPONIN I (HIGH SENSITIVITY)    EKG None  Radiology No results found.  Procedures Procedures    Medications Ordered in ED Medications - No data to display  ED Course/ Medical Decision Making/ A&P                           Medical Decision Making Amount and/or Complexity of Data Reviewed Independent Historian: caregiver Labs: ordered. Radiology: ordered and independent interpretation performed. Decision-making details documented in ED Course. ECG/medicine tests: ordered and  independent interpretation performed. Decision-making details documented in ED Course.  Risk Prescription drug management. Decision regarding hospitalization.   Patient presents to the emergency department tachypneic, hypoxic, with inspiratory expiratory wheezes and bibasilar Rales as above in respiratory distress.  Differential diagnosis includes CHF exacerbation versus COPD exacerbation versus mixed CHF COPD exacerbation versus pneumonia. Less likely PE given no associated chest pain, exam findings as above and that the patient's right lower extremity edema is reportedly chronic and longstanding.   I have personally reviewed and interpreted the patient's EKG which reveals known left bundle branch block which does not meet Sgarbossa criteria.  We will place on BiPAP, provide Solu-Medrol, empiric antibiotics including Rocephin and azithromycin and reassess.  We will follow-up electrolytes and creatinine prior to administering diuretic.  I have personally reviewed and interpreted the patient's chest x-ray which reveals persistent right upper lobe consolidation.  Will obtain CT chest with contrast to better characterize.  reassessment reveals that the patient is tolerating BiPAP well and appears much more comfortable.  I have personally reviewed interpret the patient's CT chest which reveals right upper lobe consolidation with likely postobstructive pneumonia in the  setting of mediastinal mass with potential tumor invasion into the right upper lobe as well.  I discussed the bedside these findings with family and plan to admit to stepdown for continued antibiotics and continued work-up.  Patient was admitted to stepdown in stable condition.           Final Clinical Impression(s) / ED Diagnoses Final diagnoses:  Respiratory distress  Pneumonia of right upper lobe due to infectious organism    Rx / DC Orders ED Discharge Orders     None         Levie Heritage, MD 04/04/22 1331    Isla Pence, MD 04/04/22 1334

## 2022-04-04 NOTE — Assessment & Plan Note (Signed)
Continue hydralazine 50mg  BID Hold torsemide, spironolactone with aKI

## 2022-04-04 NOTE — ED Notes (Signed)
Admitting at bedside 

## 2022-04-04 NOTE — Assessment & Plan Note (Addendum)
Continue cpap +oxygen at night

## 2022-04-04 NOTE — Progress Notes (Signed)
Patient arrived to unit at shift change. ED RN that transported the patient did not wait until floor nurse took over care.

## 2022-04-04 NOTE — Assessment & Plan Note (Signed)
Meets sepsis criteria with elevated respiratory rate and leukocytosis (on steroids though) with source of pneumonia -check PCT and RVP -blood cultures pending -check urinary antigens -continue rocephin/azithromycin  -schedule duonebs/SABA prn -trend cbc

## 2022-04-04 NOTE — Progress Notes (Signed)
Pt was taken off BiPAP and is currently on 6L HFNC. Pt is currently stable at this time.  04/04/22 1538  Therapy Vitals  Pulse Rate 75  Resp (!) 28  Patient Position (if appropriate) Lying  MEWS Score/Color  MEWS Score 2  MEWS Score Color Yellow  Respiratory Assessment  Assessment Type Assess only  Respiratory Pattern Regular;Unlabored  Chest Assessment Chest expansion symmetrical  Cough Congested;Strong;Non-productive  Bilateral Breath Sounds Diminished;Rhonchi  Oxygen Therapy/Pulse Ox  O2 Device HFNC  $ High Flow Nasal Cannula  Yes  O2 Therapy Oxygen humidified  O2 Flow Rate (L/min) 6 L/min  SpO2 95 %

## 2022-04-04 NOTE — Assessment & Plan Note (Addendum)
-  has pacemaker -Continue Eliquis -amiodarone recently stopped due to elevated ESR and concern for pulmonary toxicity. (likely due to primary lung cancer)  -on no rate controlling drugs

## 2022-04-04 NOTE — Consult Note (Signed)
NAME:  Levi [name], MRN:  [id number], DOB:  08-05-1929, LOS: 0 ADMISSION DATE:  04/04/2022, CONSULTATION DATE:  04/04/22 REFERRING MD:  Rogers Blocker CHIEF COMPLAINT:  Lung Mass   History of Present Illness:  Levi [name] is a 88 y.o. male who has a PMH as below including but not limited to CAD s/p CABG, combined heart failure, HTN, HLD, A.fib, COPD, prostate CA. He presented to [facility] ED 8/3 with dyspnea x 2 - 3 days. Just discharged 7/21 after brief admission for resp failure felt to be 2/2 COPD + CHF exacerbation. He was discharged home on his baseline of 3L O2.  He was seen by palliative care during that admission and decision was made to list code status as DNR with arrangements to be set up with palliative care as an outpatient.  CXR in ED showed persistent RUL abnormality; therefore, CTA chest was obtained and was negative for PE but demonstrated 4 x 3 cm right hilar mass invading the mediastinum with associated mediastinal lymphadenopathy and probable interstitial spread of tumor in RUL and RLL along with postobstructive PNA. He required BiPAP due to desaturations and work of breathing.  He was admitted by [facility] for ongoing management. PCCM consulted 8/3 for recs regarding lung mass.  He is a former smoker, quite in [place] but roughly 30 pack year history prior to that.  Pertinent  Medical History:  has PROSTATE CANCER; ADENOMATOUS COLONIC POLYP; HLD (hyperlipidemia); Essential hypertension; EMPHYSEMA; OSTEOPENIA; MALAISE AND FATIGUE; Heart failure with preserved ejection fraction ([place]); CHEST PAIN UNSPECIFIED; History of colonic polyps; PAF (paroxysmal atrial fibrillation) ([place]); CAD (coronary artery disease); Back pain; Angina, class III ([place]); Syncope; Allergic rhinitis; CA of prostate ([place]); Personal history of nicotine dependence; H/O coronary artery bypass surgery; Gastro-esophageal reflux disease with esophagitis; Lumbosacral spondylosis; Chronic anticoagulation; Bifascicular block;  Bradycardia; Mobitz type 2 second degree atrioventricular block; Obesity, Class III, BMI 40-49.9 (morbid obesity) ([place]); Chronic coronary artery disease; Acute on chronic diastolic CHF (congestive heart failure) ([place]); Decreased hearing of both ears; Depression, reactive; OSA (obstructive sleep apnea); Personal history of tobacco use, presenting hazards to health; Orthostatic hypotension; Ischemic cardiomyopathy; Acute kidney injury superimposed on chronic kidney disease ([place]); Acute on chronic combined systolic (congestive) and diastolic (congestive) heart failure ([place]); Dizziness; Acute kidney injury superimposed on CKD ([place]); Hypokalemia; Acute on chronic combined systolic and diastolic CHF (congestive heart failure) ([place]); Hypothyroidism; Class 3 obesity ([place]); Acute on chronic respiratory failure with hypoxia ([facility]); and Mass of right lung on their problem list.  Significant Hospital Events: Including procedures, antibiotic start and stop dates in addition to other pertinent events   8/3 admit  Interim History / Subjective:    Objective:  Blood pressure 125/83, pulse 70, temperature 98.3 F (36.8 C), temperature source Axillary, resp. rate (!) 30, height 5\' 9"  (1.753 m), weight 122.9 kg, SpO2 97 %.    FiO2 (%):  [40 %-50 %] 50 %   Intake/Output Summary (Last 24 hours) at 04/04/2022 1500 Last data filed at 04/04/2022 1235 Gross per 24 hour  Intake --  Output 800 ml  Net -800 ml   Filed Weights   04/04/22 1003  Weight: 122.9 kg    Examination: General: Elderly man lying in bed no acute distress, breathing comfortably on BiPAP Neuro: Awake and alert, answering questions appropriately, moving all extremities HEENT: [place]/AT, eyes anicteric, BiPAP mask in place Cardiovascular: S1-S2, regular rate and rhythm Lungs: Faint rales on the right, no wheezing.  Occasional coughing.  No accessory muscle use.  Abdomen: Obese, soft, nontender Musculoskeletal: Normal muscle mass, mild peripheral  edema Skin: Chronic venous stasis changes bilateral lower extremities.  Well-healed sternotomy scar.  No diffuse rashes.  Labs/imaging personally reviewed:  CTA chest 8/3 > negative for PE but demonstrated 4 x 3 cm right hilar mass invading the mediastinum with associated bulky mediastinal lymphadenopathy and probable interstitial spread of tumor in RUL and RLL along with postobstructive PNA.  Right upper lobe takeoff appears nearly completely occluded.  Assessment & Plan:  Acute on chronic respiratory failure with hypoxia-suspect community-acquired pneumonia, possibly postobstructive pneumonia -Steroids, antibiotics - BiPAP and heated high flow as needed; wean supplemental oxygen as able back to his baseline. -Continue bronchodilators  Lung mass-very concerning for primary lung cancer.  Based on severity and location of adenopathy, suspect at least stage III if primary lung cancer. -We discussed my concern that this is likely a primary lung cancer, especially with age, location, mediastinal adenopathy, and previous smoking history.  Multiple family members participated in this discussion.  At this point he is not stable for a lung biopsy from a respiratory standpoint.  We discussed the possibility of doing a biopsy when his pulmonary status is back closer to his baseline.  We also discussed options for foregoing a biopsy and pursuing comfort care.  His family has indicated that he would want to know a tissue diagnosis, but have wondered about his ability to tolerate treatment for cancer with his baseline heart disease.  They asked questions about the possibility of focusing on his comfort.  We reviewed that diagnostic procedures would carry risks, and if we do not think that he would want to undergo cancer treatment, it may not be worth the risk.  We will follow-up as his respiratory status improves to further discuss diagnostic procedure options.  I do not think his respiratory status will be amenable  to a biopsy until next week.  Best practice (evaluated daily):  Per primary  Labs   CBC: Recent Labs  Lab 04/04/22 1009 04/04/22 1032  WBC 16.4*  --   HGB 15.6 16.3  HCT 47.9 48.0  MCV 95.2  --   PLT 249  --     Basic Metabolic Panel: Recent Labs  Lab 04/04/22 1009 04/04/22 1032  NA 137 136  K 3.8 3.7  CL 95*  --   CO2 28  --   GLUCOSE 169*  --   BUN 40*  --   CREATININE 1.88*  --   CALCIUM 8.8*  --    GFR: Estimated Creatinine Clearance: 33.2 mL/min (A) (by C-G formula based on SCr of 1.88 mg/dL (H)). Recent Labs  Lab 04/04/22 1009  WBC 16.4*    Liver Function Tests: Recent Labs  Lab 04/04/22 1009  AST 39  ALT 36  ALKPHOS 95  BILITOT 0.9  PROT 6.9  ALBUMIN 3.3*   No results for input(s): "LIPASE", "AMYLASE" in the last 168 hours. No results for input(s): "AMMONIA" in the last 168 hours.  ABG    Component Value Date/Time   PHART 7.414 10/07/2011 0852   PCO2ART 39.1 10/07/2011 0852   PO2ART 83.0 10/07/2011 0852   HCO3 34.0 (H) 04/04/2022 1032   TCO2 36 (H) 04/04/2022 1032   ACIDBASEDEF 5.0 (H) 08/22/2010 2037   O2SAT 99 04/04/2022 1032     Coagulation Profile: No results for input(s): "INR", "PROTIME" in the last 168 hours.  Cardiac Enzymes: No results for input(s): "CKTOTAL", "CKMB", "CKMBINDEX", "TROPONINI" in the last 168 hours.  HbA1C:  Hgb A1c MFr Bld  Date/Time Value Ref Range Status  08/20/2010 07:04 PM (H) <5.7 % Final   5.7 (NOTE)                                                                       According to the ADA Clinical Practice Recommendations for 2011, when HbA1c is used as a screening test:   >=6.5%   Diagnostic of Diabetes Mellitus           (if abnormal result  is confirmed)  5.7-6.4%   Increased risk of developing Diabetes Mellitus  References:Diagnosis and Classification of Diabetes Mellitus,Diabetes GDJM,4268,34(HDQQI 1):S62-S69 and Standards of Medical Care in         Diabetes - 2011,Diabetes WLNL,8921,19  (Suppl  1):S11-S61.    CBG: No results for input(s): "GLUCAP" in the last 168 hours.  Review of Systems:   Review of Systems  Constitutional:  Negative for fever.  HENT: Negative.    Respiratory:  Positive for cough and shortness of breath. Negative for hemoptysis and sputum production.   Cardiovascular:  Positive for leg swelling. Negative for chest pain.  Gastrointestinal: Negative.   Skin:  Negative for rash.  Neurological: Negative.      Past Medical History:  He,  has a past medical history of Asthma, Benign neoplasm of colon, CAD (coronary artery disease) (Dec 2011), Chronic combined systolic and diastolic CHF (congestive heart failure) ([place]), CKD (chronic kidney disease), stage II, Emphysema, History of hiatal hernia, HLD (hyperlipidemia), HTN (hypertension), Malaise and fatigue, Obesity, Osteopenia, PAF (paroxysmal atrial fibrillation) ([place]), Pneumonia (1960s X 1; 1990s X 1), Presence of permanent cardiac pacemaker, Prostate cancer ([place]), Skin cancer, and Symptomatic bradycardia.   Surgical History:   Past Surgical History:  Procedure Laterality Date   ANTERIOR CERVICAL DECOMP/DISCECTOMY FUSION     BACK SURGERY     C-EYE SURGERY PROCEDURE     CATARACT EXTRACTION Right    CORONARY ANGIOPLASTY WITH STENT PLACEMENT  10/14/12   with stent to proximal and mid LAD   CORONARY ARTERY BYPASS GRAFT  08/23/10   "CABG X4"   EP IMPLANTABLE DEVICE N/A 10/05/2015   Procedure: Pacemaker Implant;  Surgeon: [name], MD;  Location: [place] CV LAB;  Service: Cardiovascular;  Laterality: N/A;   EP IMPLANTABLE DEVICE N/A 10/06/2015   Procedure: Lead Revision/Repair;  Surgeon: [name], MD;  Location: [place] [facility];  Service: Cardiovascular;  Laterality: N/A;   EYE SURGERY     INSERT / REPLACE / REMOVE PACEMAKER  10/05/2015   LEFT HEART CATHETERIZATION WITH CORONARY/GRAFT ANGIOGRAM N/A 09/01/2012   Procedure: LEFT HEART CATHETERIZATION WITH [name];  Surgeon:  [name], MD;  Location: [facility] CATH LAB;  Service: Cardiovascular;  Laterality: N/A;   MEDIAN STERNOTOMY  08/23/10   MOHS SURGERY  "@ least twice"   PACEMAKER INSERTION     STJ 10/05/15   PERCUTANEOUS CORONARY STENT INTERVENTION (PCI-S) N/A 10/14/2012   Procedure: PERCUTANEOUS CORONARY STENT INTERVENTION (PCI-S);  Surgeon: [name], MD;  Location: [facility] CATH LAB;  Service: Cardiovascular;  Laterality: N/A;   PROSTATE BIOPSY     TRANSURETHRAL RESECTION OF PROSTATE       Social History:   reports that he quit smoking  about 46 years ago. His smoking use included cigarettes. He has a 30.00 pack-year smoking history. He quit smokeless tobacco use about 6 years ago.  His smokeless tobacco use included chew. He reports that he does not drink alcohol and does not use drugs.   Family History:  His family history includes Heart attack in his father; Heart disease in his brother; Hypertension in his mother; Kidney cancer in his father; Prostate cancer in his brother; Skin cancer in his brother; Stroke in his mother. There is no history of Allergic rhinitis, Asthma, Eczema, or Urticaria.   Allergies Allergies  Allergen Reactions   Amiodarone Other (See Comments)    Pulmonary toxicity     Home Medications  Prior to Admission medications   Medication Sig Start Date End Date Taking? Authorizing Provider  apixaban (ELIQUIS) 2.5 MG TABS tablet Take 1 tablet (2.5 mg total) by mouth 2 (two) times daily. 03/12/22   [name], MD  Artificial Tear Ointment (DRY EYES OP) Apply 1 drop to eye 2 (two) times daily.    [provider]  cetirizine (ZYRTEC) 10 MG tablet Take 10 mg by mouth daily as needed for allergies or rhinitis.     [provider]  empagliflozin (JARDIANCE) 10 MG TABS tablet Take 1 tablet (10 mg total) by mouth daily. 03/09/22   Shelly Coss, MD  hydrALAZINE (APRESOLINE) 50 MG tablet Take 1 tablet (50 mg total) by mouth 2 times daily at 12 noon and 4 pm. 03/09/22    Shelly Coss, MD  levothyroxine (SYNTHROID) 112 MCG tablet TAKE 1 TABLET BY MOUTH  DAILY BEFORE BREAKFAST 11/30/21   [name], MD  nitroGLYCERIN (NITROSTAT) 0.4 MG SL tablet Place 1 tablet (0.4 mg total) under the tongue every 5 (five) minutes x 3 doses as needed for chest pain. 03/22/20   Richardson Dopp T, PA-C  pantoprazole (PROTONIX) 40 MG tablet Take 40 mg by mouth daily as needed (heartburn).    [provider]  polyethylene glycol (MIRALAX / GLYCOLAX) 17 g packet Take 17 g by mouth at bedtime.    [provider]  potassium chloride SA (KLOR-CON M) 20 MEQ tablet Take 1 tablet (20 mEq total) by mouth 2 (two) times daily. 03/22/22   Domenic Polite, MD  pravastatin (PRAVACHOL) 20 MG tablet TAKE 1 TABLET BY MOUTH  DAILY Patient taking differently: Take 20 mg by mouth daily. 08/31/21   [name], MD  predniSONE (DELTASONE) 20 MG tablet Take 40 mg daily 03/29/22   Lyda Jester M, PA-C  spironolactone (ALDACTONE) 25 MG tablet Take 0.5 tablets (12.5 mg total) by mouth daily. 06/14/21   Richardson Dopp T, PA-C  tamsulosin (FLOMAX) 0.4 MG CAPS capsule Take 0.4 mg by mouth at bedtime.     [provider]  torsemide (DEMADEX) 20 MG tablet Take 3 tablets (60 mg total) by mouth 2 (two) times daily. 05/15/21   [name], MD     Critical care time: n/a   Julian Hy, DO 04/04/22 5:27 PM [facility]

## 2022-04-04 NOTE — ED Notes (Signed)
Respiratory discussed trial off Bipap and agreed to do neb treatment

## 2022-04-04 NOTE — Assessment & Plan Note (Signed)
Continue pravachol.  

## 2022-04-04 NOTE — ED Triage Notes (Signed)
/  Pt BIB [facility] from home due to [facility] over the last 2-3 days.  Pt wife endorses a 5lb weight gain overnight. 5mg  of albuterol given. Hx of CHF.  SpO2 96% on 6L oxygen venturi mask.  VS BP 139/95.  20g left AC.

## 2022-04-04 NOTE — Progress Notes (Signed)
Pt was transported to CT via BiPAP with no apparent complications. Pt now back in room ED 018. RT will continue to monitor for any changes.

## 2022-04-04 NOTE — Assessment & Plan Note (Addendum)
88 year old presenting with acute on chronic respiratory failure with oxygen 87% on 6L requiring bipap likely secondary to pneumonia/obstructive pneumonia and new findings of possible lung cancer and suspected COPD -admit to progressive -blood cultures pending, sputum cx ordered -check PCT and RVP -urinary antigens -continue rocephin and azithromycin -scheduled duonebs/SABA prn, mucinex, continue IV steroids. Has been on 7/10 days of prednisone and will need taper -pulm consult for lung mass -palliative care consult for lung mass  -wean oxygen as tolerated back to baseline of 3-4L [place]

## 2022-04-04 NOTE — Assessment & Plan Note (Addendum)
Appears euvolemic on exam with no increased LE edema or finding on CXR or labs consistent with exacerbation  7/23: echo done shows EF 50-55% with normal diastolic function Strict I/O Holding jardiance/spironolactone and torsemide with AKI

## 2022-04-04 NOTE — Assessment & Plan Note (Signed)
TSH wnl 05/2021 Continue home synthroid at 167mcg daily

## 2022-04-04 NOTE — Progress Notes (Signed)
Patient placed on bipap with the following settings. Tolerating well at this time.   04/04/22 1025  BiPAP/CPAP/SIPAP  $ Non-Invasive Ventilator  Non-Invasive Vent Set Up;Non-Invasive Vent Initial  $ Face Mask Medium Yes  BiPAP/CPAP/SIPAP Pt Type Adult  Mask Type Full face mask  Mask Size Medium  Set Rate 12 breaths/min  Respiratory Rate 34 breaths/min  IPAP 12 cmH20  EPAP 8 cmH2O  Oxygen Percent 40 %  Minute Ventilation 17.9  Leak 0  Peak Inspiratory Pressure (PIP) 16  Tidal Volume (Vt) 507  BiPAP/CPAP/SIPAP BiPAP  Press High Alarm 40 cmH2O  Press Low Alarm 5 cmH2O  Nasal massage performed Yes  CPAP/SIPAP surface wiped down Yes  BiPAP/CPAP /SiPAP Vitals  Pulse Rate 72  Resp (!) 34  SpO2 98 %  Bilateral Breath Sounds Diminished;Inspiratory wheezes  MEWS Score/Color  MEWS Score 2  MEWS Score Color Yellow

## 2022-04-04 NOTE — H&P (Signed)
History and Physical    Patient: Levi [name] WFU:[id number] DOB: 1929/05/11 DOA: 04/04/2022 DOS: the patient was seen and examined on 04/04/2022 PCP: Bernerd Limbo, MD  Patient coming from: Home - lives alone, daughter lives next door. He uses walker and cane. Daughter has been staying with him recently.    Chief Complaint: shortness of breath and cough   HPI: Levi [name] is a 88 y.o. male with medical history significant of  CAD s/p CABG; chronic combined CHF; COPD; HTN; HLD; morbid obesity; afib; pacemaker placement; and prostate CA and chronic respiratory failure on 3-4L oxygen via [place] presenting with SOB.  He was last admitted from 7/14-7/21 for acute hypoxic respiratory failure secondary to CHF, pulmonary fibrosis and? Component of COPD. Oxygen weaned down to 3L, his baseline. Improved with diuresis. Started on aldactone. Palliative care consulted and made DNR with palliative care f/u.  He was also hospitalized on 7/3-8 with acute on chronic combined CHF, treated with Lasix and spironolactone and started on SGLT2 inhibitor.  OSA/OHS were thought to be contributors and he was discharged on home O2. Today he presents with worsening dry cough and shortness of breath. This morning he couldn't breath well so they came to the ED. His daughter states he has been on 4L oxygen during the day and then 2L at night with his CPAP. She states today his cough sounds wet. NO fever/chills at home. NO sick contacts.    Denies any fever/chills, vision changes/headaches, chest pain or palpitations,  abdominal pain, N/V/D, dysuria or leg swelling.    He does not smoke or drink alcohol.   ER Course:  vitals: temp: 97.5, bp: 152/73, HR; 74, RR: 26, oxygen: 87% on 6L [place] >bipap 98% on 40 fio2.  Pertinent labs: wbc: 16.4, BUN: 40, creatinine: 1.88, troponin 33>  ,  Ph: 7.44, po2: 122,  CXR: persistent RUL consolidation, not significantly changed over the last month.  CTA chest: 4x3 cm right hilar  mass invading the mediastinum. Associated extensive mediastinal lymphadenopathy and probably post obstructive pneumonitis and interstitial spread of tumor in the RUL and RLL. PET-CT suggested for further evaluation and referral to multi disciplinary thoracic Oncology clinic. Patient may require bronchoscopic biopsy for tissue diagnosis. No CT findings for PE.  In ED: given steroids, neb, rocephin and azithromycin. TRH asked to admit.     Review of Systems: As mentioned in the history of present illness. All other systems reviewed and are negative. Past Medical History:  Diagnosis Date   Asthma    Benign neoplasm of colon    CAD (coronary artery disease) Dec 2011   s/p CABG per Dr. Roxy Manns; had normal EF; All SVGs occluded per follow up cath with only LIMA to LAD patent; s/p PCI of the proximal and mid LAD February 2014 per Dr. Burt Knack   Chronic combined systolic and diastolic CHF (congestive heart failure) ([place])    CKD (chronic kidney disease), stage II    Emphysema    "never treated for it" (10/05/2015)   History of hiatal hernia    HLD (hyperlipidemia)    HTN (hypertension)    Malaise and fatigue    Obesity    Osteopenia    PAF (paroxysmal atrial fibrillation) (HCC)    Pneumonia 1960s X 1; 1990s X 1   Presence of permanent cardiac pacemaker    Prostate cancer ([place])    Skin cancer    "face, nose, top of ears, scalp"   Symptomatic bradycardia    STJ PPM,  10/05/15, Dr. Lovena Le   Past Surgical History:  Procedure Laterality Date   ANTERIOR CERVICAL DECOMP/DISCECTOMY FUSION     BACK SURGERY     [place]     CATARACT EXTRACTION Right    CORONARY ANGIOPLASTY WITH STENT PLACEMENT  10/14/12   with stent to proximal and mid LAD   CORONARY ARTERY BYPASS GRAFT  08/23/10   "CABG X4"   EP IMPLANTABLE DEVICE N/A 10/05/2015   Procedure: Pacemaker Implant;  Surgeon: [name], MD;  Location: [facility];  Service: Cardiovascular;  Laterality: N/A;   EP IMPLANTABLE DEVICE  N/A 10/06/2015   Procedure: Lead Revision/Repair;  Surgeon: [name], MD;  Location: [facility];  Service: Cardiovascular;  Laterality: N/A;   EYE SURGERY     INSERT / REPLACE / REMOVE PACEMAKER  10/05/2015   LEFT HEART CATHETERIZATION WITH CORONARY/GRAFT ANGIOGRAM N/A 09/01/2012   Procedure: LEFT HEART CATHETERIZATION WITH [name];  Surgeon: [name], MD;  Location: [facility] CATH LAB;  Service: Cardiovascular;  Laterality: N/A;   MEDIAN STERNOTOMY  08/23/10   MOHS SURGERY  "@ least twice"   PACEMAKER INSERTION     STJ 10/05/15   PERCUTANEOUS CORONARY STENT INTERVENTION (PCI-S) N/A 10/14/2012   Procedure: PERCUTANEOUS CORONARY STENT INTERVENTION (PCI-S);  Surgeon: [name], MD;  Location: [facility] CATH LAB;  Service: Cardiovascular;  Laterality: N/A;   PROSTATE BIOPSY     TRANSURETHRAL RESECTION OF PROSTATE     Social History:  reports that he quit smoking about 46 years ago. His smoking use included cigarettes. He has a 30.00 pack-year smoking history. He quit smokeless tobacco use about 6 years ago.  His smokeless tobacco use included chew. He reports that he does not drink alcohol and does not use drugs.  Allergies  Allergen Reactions   Amiodarone Other (See Comments)    Pulmonary toxicity    Family History  Problem Relation Age of Onset   Kidney cancer Father    Heart attack Father    Hypertension Mother    Stroke Mother    Heart disease Brother    Skin cancer Brother    Prostate cancer Brother    Allergic rhinitis Neg Hx    Asthma Neg Hx    Eczema Neg Hx    Urticaria Neg Hx     Prior to Admission medications   Medication Sig Start Date End Date Taking? Authorizing Provider  apixaban (ELIQUIS) 2.5 MG TABS tablet Take 1 tablet (2.5 mg total) by mouth 2 (two) times daily. 03/12/22   [name], MD  Artificial Tear Ointment (DRY EYES OP) Apply 1 drop to eye 2 (two) times daily.    [provider]  cetirizine (ZYRTEC) 10 MG tablet  Take 10 mg by mouth daily as needed for allergies or rhinitis.     [provider]  empagliflozin (JARDIANCE) 10 MG TABS tablet Take 1 tablet (10 mg total) by mouth daily. 03/09/22   Shelly Coss, MD  hydrALAZINE (APRESOLINE) 50 MG tablet Take 1 tablet (50 mg total) by mouth 2 times daily at 12 noon and 4 pm. 03/09/22   Shelly Coss, MD  levothyroxine (SYNTHROID) 112 MCG tablet TAKE 1 TABLET BY MOUTH  DAILY BEFORE BREAKFAST 11/30/21   [name], MD  nitroGLYCERIN (NITROSTAT) 0.4 MG SL tablet Place 1 tablet (0.4 mg total) under the tongue every 5 (five) minutes x 3 doses as needed for chest pain. 03/22/20   Liliane Shi, PA-C  pantoprazole (PROTONIX) 40 MG tablet Take 40 mg by mouth daily as needed (heartburn).    [provider]  polyethylene glycol (MIRALAX / GLYCOLAX) 17 g packet Take 17 g by mouth at bedtime.    [provider]  potassium chloride SA (KLOR-CON M) 20 MEQ tablet Take 1 tablet (20 mEq total) by mouth 2 (two) times daily. 03/22/22   Domenic Polite, MD  pravastatin (PRAVACHOL) 20 MG tablet TAKE 1 TABLET BY MOUTH  DAILY Patient taking differently: Take 20 mg by mouth daily. 08/31/21   [name], MD  predniSONE (DELTASONE) 20 MG tablet Take 40 mg daily 03/29/22   Lyda Jester M, PA-C  spironolactone (ALDACTONE) 25 MG tablet Take 0.5 tablets (12.5 mg total) by mouth daily. 06/14/21   Richardson Dopp T, PA-C  tamsulosin (FLOMAX) 0.4 MG CAPS capsule Take 0.4 mg by mouth at bedtime.     [provider]  torsemide (DEMADEX) 20 MG tablet Take 3 tablets (60 mg total) by mouth 2 (two) times daily. 05/15/21   [name], MD    Physical Exam: Vitals:   04/04/22 1546 04/04/22 1600 04/04/22 1700 04/04/22 1715  BP:  139/70 133/88 138/72  Pulse:  72 73 73  Resp:  (!) 27 16 (!) 22  Temp:      TempSrc:      SpO2: 96% 94% 96% 97%  Weight:      Height:       General:  Appears calm and comfortable and is in NAD. Bipap in place.  Eyes:  PERRL,  EOMI, normal lids, iris ENT:  grossly normal hearing,can not examine mouth due to bipap Neck:  no LAD, masses or thyromegaly;  no carotid bruits Cardiovascular:  RRR, no m/r/g. trace LE edema.  Respiratory:   course breath sounds, occasional expiratory wheeze in bilateral upper lobes. Normal work of breathing.  Abdomen:  soft, NT, ND, NABS Back:   normal alignment, no CVAT Skin:  no rash or induration seen on limited exam Musculoskeletal:  grossly normal tone BUE/BLE, good ROM, no bony abnormality Lower extremity:  trace LE edema.  Limited foot exam with no ulcerations.  2+ distal pulses. Psychiatric:  grossly normal mood and affect, speech fluent and appropriate, AOx3 Neurologic:  CN 2-12 grossly intact, moves all extremities in coordinated fashion, sensation intact   Radiological Exams on Admission: Independently reviewed - see discussion in A/P where applicable  CT Angio Chest PE W and/or Wo Contrast  Result Date: 04/04/2022 CLINICAL DATA:  2-3 day history of shortness of breath. EXAM: CT ANGIOGRAPHY CHEST WITH CONTRAST TECHNIQUE: Multidetector CT imaging of the chest was performed using the standard protocol during bolus administration of intravenous contrast. Multiplanar CT image reconstructions and MIPs were obtained to evaluate the vascular anatomy. RADIATION DOSE REDUCTION: This exam was performed according to the departmental dose-optimization program which includes automated exposure control, adjustment of the mA and/or kV according to patient size and/or use of iterative reconstruction technique. CONTRAST:  84m OMNIPAQUE IOHEXOL 350 MG/ML SOLN COMPARISON:  Chest x-ray, same date. FINDINGS: Cardiovascular: The heart is upper limits of normal in size given the patient's age. No pericardial effusion. The aorta is normal in caliber. No dissection. Moderate to advanced atherosclerotic calcifications. Extensive coronary artery calcifications and evidence of prior coronary artery bypass  surgery. Left atrial closure device noted. The pulmonary arteries are fairly well opacified. No filling defects to suggest pulmonary embolism. Mediastinum/Nodes: Right hilar mass and extensive mediastinal lymphadenopathy. 2.5 cm right paratracheal node on  image 37/5. 2.5 cm subcarinal node on image 51/5. Extensive hilar and infrahilar adenopathy. The esophagus is grossly normal. Lungs/Pleura: Dense right upper lobe and right lower lobe airspace consolidation likely postobstructive pneumonitis but could not exclude some component interstitial spread of tumor. Underlying chronic bronchitic type changes. I do not see any obvious metastatic pulmonary nodules. There is a small right pleural effusion noted. Upper Abdomen: No significant upper abdominal findings. No obvious hepatic metastatic disease. The adrenal glands are not included. Musculoskeletal: No significant bony findings. No supraclavicular or axillary adenopathy. Thyroid gland is unremarkable. Review of the MIP images confirms the above findings. IMPRESSION: 1. 4 x 3 cm right hilar mass invading the mediastinum. Associated extensive mediastinal lymphadenopathy and probable postobstructive pneumonitis and interstitial spread of tumor in the right upper lobe and right lower lobe. PET-CT suggested for further evaluation and referral to multi disciplinary thoracic Oncology clinic. Patient may require bronchoscopic biopsy for tissue diagnosis. 2. No CT findings for pulmonary embolism. 3. No findings for upper abdominal or osseous metastatic disease. Aortic Atherosclerosis (ICD10-I70.0) and Emphysema (ICD10-J43.9). Electronically Signed   By: Marijo Sanes M.D.   On: 04/04/2022 12:53   DG Chest Port 1 View  Result Date: 04/04/2022 CLINICAL DATA:  Shortness of breath. EXAM: PORTABLE CHEST 1 VIEW COMPARISON:  Chest x-ray dated March 20, 2022. FINDINGS: Unchanged left chest wall pacemaker. Stable cardiomegaly status post CABG and left atrial appendage clipping.  Normal pulmonary vascularity. Continued focal consolidation in the peripheral right upper lobe. No pleural effusion or pneumothorax. Unchanged bibasilar scarring. No acute osseous abnormality. IMPRESSION: 1. Persistent right upper lobe peripheral consolidation, not significantly changed over the past month. If the patient has been on appropriate antibiotic therapy during this time, CT of the chest is recommended for further evaluation. Electronically Signed   By: Titus Dubin M.D.   On: 04/04/2022 10:56    EKG: Independently reviewed.  Atrial flutter with rate 73 with 3:1 AV block; nonspecific ST changes with no evidence of acute ischemia Prolonged qt  Labs on Admission: I have personally reviewed the available labs and imaging studies at the time of the admission.  Pertinent labs:   wbc: 16.4,  BUN: 40,  creatinine: 1.88,  troponin 33> 28 ,  Ph: 7.44,  po2: 122,   Assessment and Plan: Principal Problem:   Acute on chronic respiratory failure with hypoxia (HCC) Active Problems:   Sepsis due to pneumonia ([place])   Acute kidney injury superimposed on CKD ([place])   Mass of right lung   CAD with H/O coronary artery bypass surgery with elevated troponins   HLD (hyperlipidemia)   PAF (paroxysmal atrial fibrillation) (HCC)   Heart failure with preserved ejection fraction (HCC)   Essential hypertension   Hypothyroidism   OSA (obstructive sleep apnea)    Assessment and Plan: * Acute on chronic respiratory failure with hypoxia ([place]) 88 year old presenting with acute on chronic respiratory failure with oxygen 87% on 6L requiring bipap likely secondary to pneumonia/obstructive pneumonia and new findings of possible lung cancer and suspected COPD -admit to progressive -blood cultures pending, sputum cx ordered -check PCT and RVP -urinary antigens -continue rocephin and azithromycin -scheduled duonebs/SABA prn, mucinex, continue IV steroids. Has been on 7/10 days of prednisone and will need  taper -pulm consult for lung mass -palliative care consult for lung mass  -wean oxygen as tolerated back to baseline of 3-4L [place]   Sepsis due to pneumonia [facility]) Meets sepsis criteria with elevated respiratory rate and leukocytosis (on  steroids though) with source of pneumonia -check PCT and RVP -blood cultures pending -check urinary antigens -continue rocephin/azithromycin  -schedule duonebs/SABA prn -trend cbc   Mass of right lung Concern for primary lung cancer Have asked pulmonology to see as they would like to know options about biopsy Continue eliquis for now as he is not medically stable for a biopsy  Discussed with patient and family and will have palliative care see as well for possible comfort care  Likely contributing to #1  Acute kidney injury superimposed on CKD ([place]) Baseline creatinine appears to be around 1.5-1.7 Creatinine today 1.88 UA pending  Strict I/O  Hold nephrotoxic drugs  Trend   CAD with H/O coronary artery bypass surgery with elevated troponins Troponin flat and down trending with no chest pain not consistent with ACS Secondary to demand ischemia in setting of acute on chronic respiratory failure Continue medical management with pravachol, eliquis, imdur   HLD (hyperlipidemia) Continue pravachol   PAF (paroxysmal atrial fibrillation) (HCC) - has pacemaker -Continue Eliquis -amiodarone recently stopped due to elevated ESR and concern for pulmonary toxicity. (likely due to primary lung cancer)  -on no rate controlling drugs   Heart failure with preserved ejection fraction ([place]) Appears euvolemic on exam with no increased LE edema or finding on CXR or labs consistent with exacerbation  7/23: echo done shows EF 50-55% with normal diastolic function Strict I/O Holding jardiance/spironolactone and torsemide with AKI   Essential hypertension Continue hydralazine 70m BID Hold torsemide, spironolactone with aKI   Hypothyroidism TSH wnl  05/2021 Continue home synthroid at 111m daily   OSA (obstructive sleep apnea) Continue cpap +oxygen at night      Advance Care Planning:   Code Status: DNR  Consults: pulmonology and palliative care   DVT Prophylaxis: eliquis   Family Communication: daughter at bedside: [name] Holleran   Severity of Illness: The appropriate patient status for this patient is INPATIENT. Inpatient status is judged to be reasonable and necessary in order to provide the required intensity of service to ensure the patient's safety. The patient's presenting symptoms, physical exam findings, and initial radiographic and laboratory data in the context of their chronic comorbidities is felt to place them at high risk for further clinical deterioration. Furthermore, it is not anticipated that the patient will be medically stable for discharge from the hospital within 2 midnights of admission.   * I certify that at the point of admission it is my clinical judgment that the patient will require inpatient hospital care spanning beyond 2 midnights from the point of admission due to high intensity of service, high risk for further deterioration and high frequency of surveillance required.*  Author: AlOrma FlamingMD 04/04/2022 7:12 PM  For on call review [email or web address]

## 2022-04-04 NOTE — ED Notes (Signed)
Respiratory therapy in room per provider request.  Pt being put on BiPAP.

## 2022-04-04 NOTE — Assessment & Plan Note (Addendum)
Baseline creatinine appears to be around 1.5-1.7 Creatinine today 1.88 UA pending  Strict I/O  Hold nephrotoxic drugs  Trend

## 2022-04-05 ENCOUNTER — Encounter (HOSPITAL_COMMUNITY): Payer: Self-pay | Admitting: Family Medicine

## 2022-04-05 ENCOUNTER — Inpatient Hospital Stay (HOSPITAL_COMMUNITY): Payer: Medicare Other

## 2022-04-05 DIAGNOSIS — J9621 Acute and chronic respiratory failure with hypoxia: Secondary | ICD-10-CM | POA: Diagnosis not present

## 2022-04-05 DIAGNOSIS — J189 Pneumonia, unspecified organism: Secondary | ICD-10-CM

## 2022-04-05 DIAGNOSIS — R918 Other nonspecific abnormal finding of lung field: Secondary | ICD-10-CM | POA: Diagnosis not present

## 2022-04-05 LAB — CBC
HCT: 43.5 % (ref 39.0–52.0)
Hemoglobin: 14.5 g/dL (ref 13.0–17.0)
MCH: 31.3 pg (ref 26.0–34.0)
MCHC: 33.3 g/dL (ref 30.0–36.0)
MCV: 93.8 fL (ref 80.0–100.0)
Platelets: 220 10*3/uL (ref 150–400)
RBC: 4.64 MIL/uL (ref 4.22–5.81)
RDW: 15.2 % (ref 11.5–15.5)
WBC: 13.1 10*3/uL — ABNORMAL HIGH (ref 4.0–10.5)
nRBC: 0 % (ref 0.0–0.2)

## 2022-04-05 LAB — EXPECTORATED SPUTUM ASSESSMENT W GRAM STAIN, RFLX TO RESP C

## 2022-04-05 LAB — BASIC METABOLIC PANEL
Anion gap: 11 (ref 5–15)
BUN: 36 mg/dL — ABNORMAL HIGH (ref 8–23)
CO2: 32 mmol/L (ref 22–32)
Calcium: 9.1 mg/dL (ref 8.9–10.3)
Chloride: 94 mmol/L — ABNORMAL LOW (ref 98–111)
Creatinine, Ser: 1.71 mg/dL — ABNORMAL HIGH (ref 0.61–1.24)
GFR, Estimated: 37 mL/min — ABNORMAL LOW (ref >60)
Glucose, Bld: 120 mg/dL — ABNORMAL HIGH (ref 70–99)
Potassium: 4.6 mmol/L (ref 3.5–5.1)
Sodium: 137 mmol/L (ref 135–145)

## 2022-04-05 LAB — GLUCOSE, CAPILLARY: Glucose-Capillary: 155 mg/dL — ABNORMAL HIGH (ref 70–99)

## 2022-04-05 MED ORDER — FUROSEMIDE 10 MG/ML IJ SOLN
40.0000 mg | Freq: Two times a day (BID) | INTRAMUSCULAR | Status: AC
  Administered 2022-04-05 – 2022-04-06 (×2): 40 mg via INTRAVENOUS
  Filled 2022-04-05 (×2): qty 4

## 2022-04-05 MED ORDER — METHYLPREDNISOLONE SODIUM SUCC 125 MG IJ SOLR
60.0000 mg | Freq: Two times a day (BID) | INTRAMUSCULAR | Status: DC
Start: 2022-04-05 — End: 2022-04-07
  Administered 2022-04-05 – 2022-04-07 (×4): 60 mg via INTRAVENOUS
  Filled 2022-04-05 (×4): qty 2

## 2022-04-05 MED ORDER — GUAIFENESIN-DM 100-10 MG/5ML PO SYRP
5.0000 mL | ORAL_SOLUTION | ORAL | Status: DC | PRN
  Administered 2022-04-05 – 2022-04-08 (×11): 5 mL via ORAL
  Filled 2022-04-05 (×12): qty 5

## 2022-04-05 MED ORDER — HYDRALAZINE HCL 25 MG PO TABS
25.0000 mg | ORAL_TABLET | Freq: Two times a day (BID) | ORAL | Status: DC
  Administered 2022-04-05 – 2022-04-07 (×5): 25 mg via ORAL
  Filled 2022-04-05 (×5): qty 1

## 2022-04-05 MED ORDER — BENZONATATE 100 MG PO CAPS
200.0000 mg | ORAL_CAPSULE | Freq: Three times a day (TID) | ORAL | Status: DC
  Administered 2022-04-05 – 2022-04-08 (×9): 200 mg via ORAL
  Filled 2022-04-05 (×9): qty 2

## 2022-04-05 NOTE — Plan of Care (Signed)
  Problem: Clinical Measurements: Goal: Will remain free from infection Outcome: Progressing   Problem: Health Behavior/Discharge Planning: Goal: Ability to manage health-related needs will improve Outcome: Not Progressing   

## 2022-04-05 NOTE — Progress Notes (Signed)
Daily Progress Note   Patient Name: Levi [name]       Date: 04/05/2022 DOB: 11/21/1928  Age: 88 y.o. MRN#: [id number] Attending Physician: Domenic Polite, MD Primary Care Physician: Bernerd Limbo, MD Admit Date: 04/04/2022  Reason for Consultation/Follow-up: Establishing goals of care  Subjective: Medical records reviewed including progress notes, labs, imaging. Patient assessed at the bedside. Discussed with RN. His son and daughter are present visiting. He is feeling fair today, enjoying his breakfast.  Created space and opportunity for patient's thoughts and feelings on his current illness. He does not recall much of what we discussed yesterday, as there were many people speaking with him. I reviewed our previous conversation with emphasis on my great concern for his ability to tolerate bronchoscopy and that he would not be a candidate for cancer treatment even if this tissue diagnosis was obtained. I urged him to consider his priorities at this time and whether he would be ok with the risk of complications such as difficulty with extubation. He becomes emotional and tells this PA and his family that he would not want to go through with this procedure, as the risk for decompensation is not acceptable to him. He would much rather just return home and not concern himself with the lung mass, focusing on his time with family until God decides it is his time to die. His family feels that it was important for him to hear this honest update, as he has not shown insight into the severity of his illness and how aggressive this path would be. They are supportive of his wishes and have already spoken to outpatient palliative about what transition to hospice would entail when it became evident he was at  that point in his illness.  We then discussed patient's thoughts on inpatient comfort measures and whether he would desire to continue with treatments for his pneumonia versus return home with hospice as soon as able. Counseled at length and he ultimately decides to complete his course of antibiotics before returning home to avoid further hospitalization. Family ensures his understanding of his decision and he again states that he clearly does not want to proceed with bronchoscopy, understanding that he can change his mind if he wishes.   Questions and concerns addressed. PMT will continue to support holistically.   Length of  Stay: 1   Physical Exam Vitals and nursing note reviewed.  Constitutional:      General: He is not in acute distress.    Appearance: He is ill-appearing.  Cardiovascular:     Rate and Rhythm: Normal rate.  Skin:    General: Skin is warm and dry.  Neurological:     Mental Status: He is alert and oriented to person, place, and time.  Psychiatric:        Mood and Affect: Mood normal.        Behavior: Behavior normal.             Vital Signs: BP 125/66 (BP Location: Left Arm)   Pulse 74   Temp 98.2 F (36.8 C) (Axillary)   Resp 19   Ht 5\' 9"  (1.753 m)   Wt 123 kg   SpO2 92%   BMI 40.04 kg/m  SpO2: SpO2: 92 % O2 Device: O2 Device: Nasal Cannula O2 Flow Rate: O2 Flow Rate (L/min): 4 L/min      Palliative Assessment/Data: 30%     Palliative Care Assessment & Plan   Patient Profile: 88 y.o. male  with past medical history of  CAD s/p CABG; chronic combined CHF; COPD; HTN; HLD; morbid obesity; afib; pacemaker placement; and prostate CA admitted on 04/04/2022 with shortness of breath.    Patient with 3 admissions in the past 6 months.  Currently admitted for acute on chronic respiratory failure, sepsis due to pneumonia.  Also with right lung mass concerning for lung cancer.  PMT has been consulted to assist with goals of care  conversation.  Assessment: Goals of care conversation Postobstructive PNA COPD CHF Acute on chronic respiratory failure Right lung mass concerning for primary lung cancer Rhinovirus positive  Recommendations/Plan: Continue DNR Continue current medical interventions without further escalation, at this time patient does not wish to proceed with further workup of lung mass/bronchoscopy Goal is for treatment of pneumonia and return home with hospice support when medically stable, will discuss with [facility] hospital liaison  Spiritual care consult declined at this time, family will reach out to personal pastor Psychosocial and emotional support provided PMT will continue to follow and support   Prognosis:  < 6 months  Discharge Planning: Home with Hospice  Care plan was discussed with patient, patient's family, RN, Dr. Broadus John   [facility]         Nomar Broad Johnnette Litter, PA-C  Palliative Medicine Team Team phone # [phone]  Thank you for allowing the Palliative Medicine Team to assist in the care of this patient. Please utilize secure chat with additional questions, if there is no response within 30 minutes please call the above phone number.  Palliative Medicine Team providers are available by phone from 7am to 7pm daily and can be reached through the team cell phone.  Should this patient require assistance outside of these hours, please call the patient's attending physician.

## 2022-04-05 NOTE — Progress Notes (Signed)
NAME:  Levi [name], MRN:  [id number], DOB:  [birth year], LOS: 1 ADMISSION DATE:  04/04/2022, CONSULTATION DATE:  04/04/22 REFERRING MD:  Rogers Blocker CHIEF COMPLAINT:  Lung Mass   History of Present Illness:  Levi [name] is a 88 y.o. male who has a PMH as below including but not limited to CAD s/p CABG, combined heart failure, HTN, HLD, A.fib, COPD, prostate CA. He presented to [facility] ED 8/3 with dyspnea x 2 - 3 days. Just discharged 7/21 after brief admission for resp failure felt to be 2/2 COPD + CHF exacerbation. He was discharged home on his baseline of 3L O2.  He was seen by palliative care during that admission and decision was made to list code status as DNR with arrangements to be set up with palliative care as an outpatient.  CXR in ED showed persistent RUL abnormality; therefore, CTA chest was obtained and was negative for PE but demonstrated 4 x 3 cm right hilar mass invading the mediastinum with associated mediastinal lymphadenopathy and probable interstitial spread of tumor in RUL and RLL along with postobstructive PNA. He required BiPAP due to desaturations and work of breathing.  He was admitted by [facility] for ongoing management. PCCM consulted 8/3 for recs regarding lung mass.  He is a former smoker, quite in [place] but roughly 30 pack year history prior to that.  Pertinent  Medical History:  has PROSTATE CANCER; ADENOMATOUS COLONIC POLYP; HLD (hyperlipidemia); Essential hypertension; EMPHYSEMA; OSTEOPENIA; MALAISE AND FATIGUE; Heart failure with preserved ejection fraction ([place]); CHEST PAIN UNSPECIFIED; History of colonic polyps; PAF (paroxysmal atrial fibrillation) ([place]); CAD (coronary artery disease); Back pain; Angina, class III ([place]); Syncope; Allergic rhinitis; CA of prostate ([place]); Personal history of nicotine dependence; CAD with H/O coronary artery bypass surgery with elevated troponins; Gastro-esophageal reflux disease with esophagitis; Lumbosacral spondylosis; Chronic  anticoagulation; Bifascicular block; Bradycardia; Mobitz type 2 second degree atrioventricular block; Obesity, Class III, BMI 40-49.9 (morbid obesity) ([place]); Chronic coronary artery disease; Acute on chronic diastolic CHF (congestive heart failure) ([place]); Decreased hearing of both ears; Depression, reactive; OSA (obstructive sleep apnea); Personal history of tobacco use, presenting hazards to health; Orthostatic hypotension; Ischemic cardiomyopathy; Acute kidney injury superimposed on chronic kidney disease ([place]); Acute on chronic combined systolic (congestive) and diastolic (congestive) heart failure ([place]); Dizziness; Acute kidney injury superimposed on CKD ([place]); Hypokalemia; Acute on chronic combined systolic and diastolic CHF (congestive heart failure) ([place]); Hypothyroidism; Class 3 obesity ([place]); Acute on chronic respiratory failure with hypoxia ([place]); Mass of right lung; and Sepsis due to pneumonia [facility]) on their problem list.  Significant Hospital Events: Including procedures, antibiotic start and stop dates in addition to other pertinent events   8/3 admit  Interim History / Subjective:  Utilizes CPAP during the night.  Poor insight into current situation  Objective:  Blood pressure 133/66, pulse 72, temperature 97.8 F (36.6 C), temperature source Oral, resp. rate 20, height 5\' 9"  (1.753 m), weight 123 kg, SpO2 91 %.    FiO2 (%):  [40 %-50 %] 50 %   Intake/Output Summary (Last 24 hours) at 04/05/2022 0850 Last data filed at 04/05/2022 0756 Gross per 24 hour  Intake 0 ml  Output 2600 ml  Net -2600 ml    Filed Weights   04/04/22 1003 04/04/22 1947  Weight: 122.9 kg 123 kg    Examination: General: Elderly male whose is severely debilitated HEENT: No JVD or lymphadenopathy is appreciated Neuro: Hard of hearing and intact CV: Heart sounds are distant PULM: Decreased throughout  O2 saturation 94% 4 L nasal cannula utilize CPAP during the night  GI: soft, bsx4 active   GU: Extremities: warm/dry, 2+ edema  Skin: no rashes or lesions   Labs/imaging personally reviewed:  CTA chest 8/3 > negative for PE but demonstrated 4 x 3 cm right hilar mass invading the mediastinum with associated bulky mediastinal lymphadenopathy and probable interstitial spread of tumor in RUL and RLL along with postobstructive PNA.  Right upper lobe takeoff appears nearly completely occluded.  Assessment & Plan:  Acute on chronic respiratory failure with hypoxia-suspect community-acquired pneumonia, possibly postobstructive pneumonia Continue steroids and antibiotics Nocturnal CPAP Bronchodilators as needed     Lung mass-very concerning for primary lung cancer.  Based on severity and location of adenopathy, suspect at least stage III if primary lung cancer.  Questionable primary cancer Not stable enough for bronchoscopy at this time and not quite sure what she would do with inflammation.  Too frail to undergo treatments. Palliative care is involved   Positive rhinovirus  Best practice (evaluated daily):  Per primary  Labs   CBC: Recent Labs  Lab 04/04/22 1009 04/04/22 1032 04/05/22 0307  WBC 16.4*  --  13.1*  HGB 15.6 16.3 14.5  HCT 47.9 48.0 43.5  MCV 95.2  --  93.8  PLT 249  --  220     Basic Metabolic Panel: Recent Labs  Lab 04/04/22 1009 04/04/22 1032 04/05/22 0307  NA 137 136 137  K 3.8 3.7 4.6  CL 95*  --  94*  CO2 28  --  32  GLUCOSE 169*  --  120*  BUN 40*  --  36*  CREATININE 1.88*  --  1.71*  CALCIUM 8.8*  --  9.1    GFR: Estimated Creatinine Clearance: 36.5 mL/min (A) (by C-G formula based on SCr of 1.71 mg/dL (H)). Recent Labs  Lab 04/04/22 1009 04/04/22 1410 04/05/22 0307  PROCALCITON  --  2.25  --   WBC 16.4*  --  13.1*     Liver Function Tests: Recent Labs  Lab 04/04/22 1009  AST 39  ALT 36  ALKPHOS 95  BILITOT 0.9  PROT 6.9  ALBUMIN 3.3*    No results for input(s): "LIPASE", "AMYLASE" in the last 168  hours. No results for input(s): "AMMONIA" in the last 168 hours.  ABG    Component Value Date/Time   PHART 7.414 10/07/2011 0852   PCO2ART 39.1 10/07/2011 0852   PO2ART 83.0 10/07/2011 0852   HCO3 34.0 (H) 04/04/2022 1032   TCO2 36 (H) 04/04/2022 1032   ACIDBASEDEF 5.0 (H) 08/22/2010 2037   O2SAT 99 04/04/2022 1032     Coagulation Profile: No results for input(s): "INR", "PROTIME" in the last 168 hours.  Cardiac Enzymes: No results for input(s): "CKTOTAL", "CKMB", "CKMBINDEX", "TROPONINI" in the last 168 hours.  HbA1C: Hgb A1c MFr Bld  Date/Time Value Ref Range Status  08/20/2010 07:04 PM (H) <5.7 % Final   5.7 (NOTE)                                                                       According to the ADA Clinical Practice Recommendations for 2011, when HbA1c is used as a screening test:   >=6.5%  Diagnostic of Diabetes Mellitus           (if abnormal result  is confirmed)  5.7-6.4%   Increased risk of developing Diabetes Mellitus  References:Diagnosis and Classification of Diabetes Mellitus,Diabetes TDSK,8768,11(XBWIO 1):S62-S69 and Standards of Medical Care in         Diabetes - 2011,Diabetes Care,2011,34  (Suppl 1):S11-S61.    CBG: No results for input(s): "GLUCAP" in the last 168 hours.   Critical care time: n/a  Richardson Landry Sarra Rachels ACNP Acute Care Nurse Practitioner [place] Please consult Amion 04/05/2022, 8:51 AM

## 2022-04-05 NOTE — Progress Notes (Signed)
PROGRESS NOTE    Levi [name]  PNT:[id number] DOB: December 04, 1928 DOA: 04/04/2022 PCP: Bernerd Limbo, MD  89/M with history of CAD/CABG, chronic diastolic CHF, COPD, chronic respiratory failure on 3 L O2, prostate cancer, paroxysmal A-fib recently discharged after hospitalization from COPD and CHF exacerbation, seen by palliative care then decision was made for DNR with outpatient palliative care follow-up. -Back to [facility] ED 8/3 with dyspnea and cough for 2 to 3 days, chest x-ray in ED noted persistent right upper lobe abnormality, CTA chest noted for X 3 cm right hilar mass invading mediastinum with associated mediastinal lymphadenopathy and interstitial spread of tumor in right upper lobe and right lower lobe along with postobstructive pneumonia. -Pulmonary and palliative care was consulted  Subjective: -Feels a little better today, coughing some  Assessment and Plan:  Acute on chronic respiratory failure with hypoxia (HCC) -Secondary to postobstructive pneumonia and COPD exacerbation -Continue IV ceftriaxone, azithromycin, cut down IV steroids -Continue DuoNebs, pulmonary toilet -CPAP nightly  Sepsis due to pneumonia ([place]) -As above  Right lung mass -Highly suspicious for primary lung CA -Pulmonary consulting, not stable enough for bronc at this time and doubt he would be a candidate for therapy regardless -Palliative care consulted as well  Acute kidney injury superimposed on CKD ([place]) Baseline creatinine appears to be around 1.5-1.7 -Stable monitor  CAD with H/O coronary artery bypass surgery with elevated troponins Continue medical management with pravachol, eliquis, imdur   HLD (hyperlipidemia) Continue pravachol   PAF (paroxysmal atrial fibrillation) (HCC) - has pacemaker -Continue Eliquis -amiodarone recently stopped due to elevated ESR and concern for pulmonary toxicity. (likely due to primary lung cancer)   Acute on chronic diastolic CHF 0/86: echo done shows  EF 50-55% with normal diastolic function -Restart IV Lasix today -Holding Jardiance and Aldactone  Essential hypertension -Decrease hydralazine dose, holding Aldactone and torsemide  Hypothyroidism TSH wnl 05/2021 Continue home synthroid at 144mg daily   OSA (obstructive sleep apnea) Continue cpap +oxygen at night   DVT prophylaxis: Eliquis Code Status: DNR Family Communication: Daughter at bedside Disposition Plan: To be determined  Consultants:    Procedures:   Antimicrobials:    Objective: Vitals:   04/05/22 0300 04/05/22 0333 04/05/22 0416 04/05/22 0718  BP:   (!) 144/74 133/66  Pulse: 79  75 72  Resp: (!) 23 (!) _0 Temp:   97.8 F (36.6 C) 97.8 F (36.6 C)  TempSrc:   Oral Oral  SpO2:   90% 91%  Weight:      Height:        Intake/Output Summary (Last 24 hours) at 04/05/2022 1158 Last data filed at 04/05/2022 0756 Gross per 24 hour  Intake 0 ml  Output 2600 ml  Net -2600 ml   Filed Weights   04/04/22 1003 04/04/22 1947  Weight: 122.9 kg 123 kg    Examination:  General exam: Elderly male sitting up in recliner, AAOx3, no distress HEENT: Positive JVD CVS: S1-S2, regular rhythm Lungs: Scattered bilateral rhonchi Abdomen: Soft, nontender, bowel sounds present Extremities: 1+ edema  Skin: No rashes Psychiatry:  Mood & affect appropriate.     Data Reviewed:   CBC: Recent Labs  Lab 04/04/22 1009 04/04/22 1032 04/05/22 0307  WBC 16.4*  --  13.1*  HGB 15.6 16.3 14.5  HCT 47.9 48.0 43.5  MCV 95.2  --  93.8  PLT 249  --  2761  Basic Metabolic Panel: Recent Labs  Lab 04/04/22 1009 04/04/22 1032 04/05/22 0307  NA 137 136 137  K 3.8 3.7 4.6  CL 95*  --  94*  CO2 28  --  32  GLUCOSE 169*  --  120*  BUN 40*  --  36*  CREATININE 1.88*  --  1.71*  CALCIUM 8.8*  --  9.1   GFR: Estimated Creatinine Clearance: 36.5 mL/min (A) (by C-G formula based on SCr of 1.71 mg/dL (H)). Liver Function Tests: Recent Labs  Lab 04/04/22 1009   AST 39  ALT 36  ALKPHOS 95  BILITOT 0.9  PROT 6.9  ALBUMIN 3.3*   No results for input(s): "LIPASE", "AMYLASE" in the last 168 hours. No results for input(s): "AMMONIA" in the last 168 hours. Coagulation Profile: No results for input(s): "INR", "PROTIME" in the last 168 hours. Cardiac Enzymes: No results for input(s): "CKTOTAL", "CKMB", "CKMBINDEX", "TROPONINI" in the last 168 hours. BNP (last 3 results) Recent Labs    05/11/21 1025  PROBNP 1,997*   HbA1C: No results for input(s): "HGBA1C" in the last 72 hours. CBG: No results for input(s): "GLUCAP" in the last 168 hours. Lipid Profile: No results for input(s): "CHOL", "HDL", "LDLCALC", "TRIG", "CHOLHDL", "LDLDIRECT" in the last 72 hours. Thyroid Function Tests: No results for input(s): "TSH", "T4TOTAL", "FREET4", "T3FREE", "THYROIDAB" in the last 72 hours. Anemia Panel: No results for input(s): "VITAMINB12", "FOLATE", "FERRITIN", "TIBC", "IRON", "RETICCTPCT" in the last 72 hours. Urine analysis:    Component Value Date/Time   COLORURINE YELLOW 04/04/2022 2124   APPEARANCEUR CLEAR 04/04/2022 2124   LABSPEC 1.018 04/04/2022 2124   PHURINE 5.0 04/04/2022 2124   GLUCOSEU >=500 (A) 04/04/2022 2124   HGBUR NEGATIVE 04/04/2022 2124   BILIRUBINUR NEGATIVE 04/04/2022 2124   KETONESUR NEGATIVE 04/04/2022 2124   PROTEINUR NEGATIVE 04/04/2022 2124   UROBILINOGEN 0.2 03/01/2014 1440   NITRITE NEGATIVE 04/04/2022 2124   LEUKOCYTESUR NEGATIVE 04/04/2022 2124   Sepsis Labs: _0 (procalcitonin:4,lacticidven:4)  ) Recent Results (from the past 240 hour(s))  Resp Panel by RT-PCR (Flu A&B, Covid) Anterior Nasal Swab     Status: None   Collection Time: 04/04/22 10:00 AM   Specimen: Anterior Nasal Swab  Result Value Ref Range Status   SARS Coronavirus 2 by RT PCR NEGATIVE NEGATIVE Final    Comment: (NOTE) SARS-CoV-2 target nucleic acids are NOT DETECTED.  The SARS-CoV-2 RNA is generally detectable in upper  respiratory specimens during the acute phase of infection. The lowest concentration of SARS-CoV-2 viral copies this assay can detect is 138 copies/mL. A negative result does not preclude SARS-Cov-2 infection and should not be used as the sole basis for treatment or other patient management decisions. A negative result may occur with  improper specimen collection/handling, submission of specimen other than nasopharyngeal swab, presence of viral mutation(s) within the areas targeted by this assay, and inadequate number of viral copies(<138 copies/mL). A negative result must be combined with clinical observations, patient history, and epidemiological information. The expected result is Negative.  Fact Sheet for Patients:  EntrepreneurPulse.com.au  Fact Sheet for Healthcare Providers:  IncredibleEmployment.be  This test is no t yet approved or cleared by the [place] FDA and  has been authorized for detection and/or diagnosis of SARS-CoV-2 by FDA under an Emergency Use Authorization (EUA). This EUA will remain  in effect (meaning this test can be used) for the duration of the COVID-19 declaration under Section 564(b)(1) of the Act, 21 U.S.C.section 360bbb-3(b)(1), unless the authorization is terminated  or revoked sooner.       Influenza A by PCR NEGATIVE  NEGATIVE Final   Influenza B by PCR NEGATIVE NEGATIVE Final    Comment: (NOTE) The Xpert Xpress SARS-CoV-2/FLU/RSV plus assay is intended as an aid in the diagnosis of influenza from Nasopharyngeal swab specimens and should not be used as a sole basis for treatment. Nasal washings and aspirates are unacceptable for Xpert Xpress SARS-CoV-2/FLU/RSV testing.  Fact Sheet for Patients: EntrepreneurPulse.com.au  Fact Sheet for Healthcare Providers: IncredibleEmployment.be  This test is not yet approved or cleared by the [place] FDA and has been  authorized for detection and/or diagnosis of SARS-CoV-2 by FDA under an Emergency Use Authorization (EUA). This EUA will remain in effect (meaning this test can be used) for the duration of the COVID-19 declaration under Section 564(b)(1) of the Act, 21 U.S.C. section 360bbb-3(b)(1), unless the authorization is terminated or revoked.  Performed at [facility], [place] [address] [place], [place] [address]   Respiratory (~20 pathogens) panel by PCR     Status: Abnormal   Collection Time: 04/04/22 10:00 AM   Specimen: Nasopharyngeal Swab; Respiratory  Result Value Ref Range Status   Adenovirus NOT DETECTED NOT DETECTED Final   Coronavirus 229E NOT DETECTED NOT DETECTED Final    Comment: (NOTE) The Coronavirus on the Respiratory Panel, DOES NOT test for the novel  Coronavirus (2019 nCoV)    Coronavirus HKU1 NOT DETECTED NOT DETECTED Final   Coronavirus NL63 NOT DETECTED NOT DETECTED Final   Coronavirus OC43 NOT DETECTED NOT DETECTED Final   Metapneumovirus NOT DETECTED NOT DETECTED Final   Rhinovirus / Enterovirus DETECTED (A) NOT DETECTED Final   Influenza A NOT DETECTED NOT DETECTED Final   Influenza B NOT DETECTED NOT DETECTED Final   Parainfluenza Virus 1 NOT DETECTED NOT DETECTED Final   Parainfluenza Virus 2 NOT DETECTED NOT DETECTED Final   Parainfluenza Virus 3 NOT DETECTED NOT DETECTED Final   Parainfluenza Virus 4 NOT DETECTED NOT DETECTED Final   Respiratory Syncytial Virus NOT DETECTED NOT DETECTED Final   Bordetella pertussis NOT DETECTED NOT DETECTED Final   Bordetella Parapertussis NOT DETECTED NOT DETECTED Final   Chlamydophila pneumoniae NOT DETECTED NOT DETECTED Final   Mycoplasma pneumoniae NOT DETECTED NOT DETECTED Final    Comment: Performed at [facility], [address], [place], [place] [address]  Culture, blood (routine x 2)     Status: None (Preliminary result)   Collection Time: 04/04/22 10:33 AM   Specimen: BLOOD  Result Value Ref Range  Status   Specimen Description BLOOD RIGHT ANTECUBITAL  Final   Special Requests   Final    BOTTLES DRAWN AEROBIC AND ANAEROBIC Blood Culture results may not be optimal due to an excessive volume of blood received in culture bottles   Culture   Final    NO GROWTH 1 DAY Performed at [facility], [place] [address] [place], [place] [address]    Report Status PENDING  Incomplete  Culture, blood (routine x 2)     Status: None (Preliminary result)   Collection Time: 04/04/22 10:43 AM   Specimen: BLOOD LEFT FOREARM  Result Value Ref Range Status   Specimen Description BLOOD LEFT FOREARM  Final   Special Requests   Final    BOTTLES DRAWN AEROBIC AND ANAEROBIC Blood Culture adequate volume   Culture   Final    NO GROWTH 1 DAY Performed at [facility], [place] [address] [place], [place] [address]    Report Status PENDING  Incomplete     Radiology  Studies: DG CHEST PORT 1 VIEW  Result Date: 04/05/2022 CLINICAL DATA:  Cough. EXAM: PORTABLE CHEST 1 VIEW COMPARISON:  April 04, 2022. FINDINGS: Stable cardiomediastinal silhouette. Status post coronary bypass graft. Left-sided pacemaker is unchanged in position. Stable right upper lobe airspace opacity is noted consistent with pneumonia. Minimal bibasilar subsegmental atelectasis is noted. Bony thorax is unremarkable. IMPRESSION: Stable right upper lobe airspace opacity is noted concerning for pneumonia. Electronically Signed   By: Marijo Conception M.D.   On: 04/05/2022 10:21   CT Angio Chest PE W and/or Wo Contrast  Result Date: 04/04/2022 CLINICAL DATA:  2-3 day history of shortness of breath. EXAM: CT ANGIOGRAPHY CHEST WITH CONTRAST TECHNIQUE: Multidetector CT imaging of the chest was performed using the standard protocol during bolus administration of intravenous contrast. Multiplanar CT image reconstructions and MIPs were obtained to evaluate the vascular anatomy. RADIATION DOSE REDUCTION: This exam was performed according to the departmental  dose-optimization program which includes automated exposure control, adjustment of the mA and/or kV according to patient size and/or use of iterative reconstruction technique. CONTRAST:  46m OMNIPAQUE IOHEXOL 350 MG/ML SOLN COMPARISON:  Chest x-ray, same date. FINDINGS: Cardiovascular: The heart is upper limits of normal in size given the patient's age. No pericardial effusion. The aorta is normal in caliber. No dissection. Moderate to advanced atherosclerotic calcifications. Extensive coronary artery calcifications and evidence of prior coronary artery bypass surgery. Left atrial closure device noted. The pulmonary arteries are fairly well opacified. No filling defects to suggest pulmonary embolism. Mediastinum/Nodes: Right hilar mass and extensive mediastinal lymphadenopathy. 2.5 cm right paratracheal node on image 37/5. 2.5 cm subcarinal node on image 51/5. Extensive hilar and infrahilar adenopathy. The esophagus is grossly normal. Lungs/Pleura: Dense right upper lobe and right lower lobe airspace consolidation likely postobstructive pneumonitis but could not exclude some component interstitial spread of tumor. Underlying chronic bronchitic type changes. I do not see any obvious metastatic pulmonary nodules. There is a small right pleural effusion noted. Upper Abdomen: No significant upper abdominal findings. No obvious hepatic metastatic disease. The adrenal glands are not included. Musculoskeletal: No significant bony findings. No supraclavicular or axillary adenopathy. Thyroid gland is unremarkable. Review of the MIP images confirms the above findings. IMPRESSION: 1. 4 x 3 cm right hilar mass invading the mediastinum. Associated extensive mediastinal lymphadenopathy and probable postobstructive pneumonitis and interstitial spread of tumor in the right upper lobe and right lower lobe. PET-CT suggested for further evaluation and referral to multi disciplinary thoracic Oncology clinic. Patient may require  bronchoscopic biopsy for tissue diagnosis. 2. No CT findings for pulmonary embolism. 3. No findings for upper abdominal or osseous metastatic disease. Aortic Atherosclerosis (ICD10-I70.0) and Emphysema (ICD10-J43.9). Electronically Signed   By: PMarijo SanesM.D.   On: 04/04/2022 12:53   DG Chest Port 1 View  Result Date: 04/04/2022 CLINICAL DATA:  Shortness of breath. EXAM: PORTABLE CHEST 1 VIEW COMPARISON:  Chest x-ray dated March 20, 2022. FINDINGS: Unchanged left chest wall pacemaker. Stable cardiomegaly status post CABG and left atrial appendage clipping. Normal pulmonary vascularity. Continued focal consolidation in the peripheral right upper lobe. No pleural effusion or pneumothorax. Unchanged bibasilar scarring. No acute osseous abnormality. IMPRESSION: 1. Persistent right upper lobe peripheral consolidation, not significantly changed over the past month. If the patient has been on appropriate antibiotic therapy during this time, CT of the chest is recommended for further evaluation. Electronically Signed   By: WTitus DubinM.D.   On: 04/04/2022 10:56     Scheduled Meds:  apixaban  2.5 mg Oral BID   hydrALAZINE  50 mg Oral q12n4p   levothyroxine  112 mcg Oral QAC breakfast   methylPREDNISolone (SOLU-MEDROL) injection  125 mg Intravenous Q24H   pravastatin  20 mg Oral Daily   sodium chloride flush  3 mL Intravenous Q12H   tamsulosin  0.4 mg Oral QHS   Continuous Infusions:  sodium chloride     azithromycin (ZITHROMAX) 500 mg in sodium chloride 0.9 % 250 mL IVPB     cefTRIAXone (ROCEPHIN)  IV 1 g (04/05/22 0824)     LOS: 1 day    Time spent: 76mn   PDomenic Polite MD [facility]   04/05/2022, 11:58 AM

## 2022-04-05 NOTE — Progress Notes (Signed)
Patient c/o "burning up". Patient states that he can't cool off.  Patient given fan for comfort  Asked patient when he started to feel hot, patient's son stated that, "maybe an hour ago'. This RN asked if was about the same time that abx azithromycin was given. Patient's son yes he thinks so.   Reaction to the antibiotic??  MD: please look into this further. thanks

## 2022-04-06 DIAGNOSIS — J9621 Acute and chronic respiratory failure with hypoxia: Secondary | ICD-10-CM | POA: Diagnosis not present

## 2022-04-06 LAB — BASIC METABOLIC PANEL
Anion gap: 10 (ref 5–15)
BUN: 41 mg/dL — ABNORMAL HIGH (ref 8–23)
CO2: 29 mmol/L (ref 22–32)
Calcium: 8.7 mg/dL — ABNORMAL LOW (ref 8.9–10.3)
Chloride: 95 mmol/L — ABNORMAL LOW (ref 98–111)
Creatinine, Ser: 1.62 mg/dL — ABNORMAL HIGH (ref 0.61–1.24)
GFR, Estimated: 40 mL/min — ABNORMAL LOW (ref >60)
Glucose, Bld: 134 mg/dL — ABNORMAL HIGH (ref 70–99)
Potassium: 4.2 mmol/L (ref 3.5–5.1)
Sodium: 134 mmol/L — ABNORMAL LOW (ref 135–145)

## 2022-04-06 LAB — CBC
HCT: 44.3 % (ref 39.0–52.0)
Hemoglobin: 14.2 g/dL (ref 13.0–17.0)
MCH: 30.5 pg (ref 26.0–34.0)
MCHC: 32.1 g/dL (ref 30.0–36.0)
MCV: 95.3 fL (ref 80.0–100.0)
Platelets: 218 10*3/uL (ref 150–400)
RBC: 4.65 MIL/uL (ref 4.22–5.81)
RDW: 15.2 % (ref 11.5–15.5)
WBC: 14.9 10*3/uL — ABNORMAL HIGH (ref 4.0–10.5)
nRBC: 0 % (ref 0.0–0.2)

## 2022-04-06 NOTE — Progress Notes (Signed)
Daily Progress Note   Patient Name: Levi [name]       Date: 04/06/2022 DOB: [birth year]  Age: 88 y.o. MRN#: [id number] Attending Physician: Domenic Polite, MD Primary Care Physician: Bernerd Limbo, MD Admit Date: 04/04/2022  Reason for Consultation/Follow-up: Establishing goals of care  Subjective: Medical records reviewed including progress notes, labs, imaging. Patient assessed in bedside chair. Discussed with RN.  He reports feeling fair, about the same as yesterday.  His daughter [name] is present visiting at the bedside.  Created space and opportunity for patient and family's thoughts and feelings on his current illness.  Reviewed concepts including comfort feeding, allowing sips and bites for enjoyment while being cautious to avoid choking and fluid overload.  We discussed the current treatment plan and that discharge time will be based on how he responds to ongoing antibiotics, steroids, breathing treatments.  Patient confirms his desire to improve what he can with these treatments, then return home with hospice rather than pursuing diagnostics and treatments for his lung mass.  Patient notes that last night, liquid medication for his cough was provided which was very helpful.  He would like to have this at home.  Reviewed MAR and discussed Robitussin-DM being available over-the-counter, which may be more helpful than the regular Robitussin he has at home.  Provided overview of hospice philosophy and reassured patient and family that if comfort cannot be adequately provided at home, he will still have the option to return to the hospital for further IV comfort medicines or hospice facility if preferred and eligible.  Questions and concerns addressed. PMT will continue to support  holistically.   Length of Stay: 2   Physical Exam Vitals and nursing note reviewed.  Constitutional:      General: He is not in acute distress.    Appearance: He is ill-appearing.  Cardiovascular:     Rate and Rhythm: Normal rate.  Pulmonary:     Effort: Pulmonary effort is normal.  Skin:    General: Skin is warm and dry.  Neurological:     Mental Status: He is alert and oriented to person, place, and time.  Psychiatric:        Mood and Affect: Mood normal.        Behavior: Behavior normal.  Vital Signs: BP (!) 146/83 (BP Location: Left Arm)   Pulse 77   Temp (!) 97.3 F (36.3 C) (Axillary)   Resp 20   Ht 5\' 9"  (1.753 m)   Wt 122.8 kg   SpO2 93%   BMI 39.98 kg/m  SpO2: SpO2: 93 % O2 Device: O2 Device: Nasal Cannula O2 Flow Rate: O2 Flow Rate (L/min): 4 L/min      Palliative Assessment/Data: 30%    Palliative Care Assessment & Plan   Patient Profile: 88 y.o. male  with past medical history of  CAD s/p CABG; chronic combined CHF; COPD; HTN; HLD; morbid obesity; afib; pacemaker placement; and prostate CA admitted on 04/04/2022 with shortness of breath.    Patient with 3 admissions in the past 6 months.  Currently admitted for acute on chronic respiratory failure, sepsis due to pneumonia.  Also with right lung mass concerning for lung cancer.  PMT has been consulted to assist with goals of care conversation.  Assessment: Goals of care conversation Postobstructive PNA COPD CHF Acute on chronic respiratory failure Right lung mass concerning for primary lung cancer Rhinovirus positive  Recommendations/Plan: Continue DNR Continue current care Discussed with [facility] hospital liaison via secure chat, appreciate assistance coordinating transition from palliative to hospice support at discharge Psychosocial emotional support provided PMT will continue to follow and support as needed  Prognosis:  < 6 months  Discharge Planning: Home with  Hospice  Care plan was discussed with patient, patient's family, RN, [facility] hospital liaisons    MDM Pomeroy, PA-C  Palliative Medicine Team Team phone # [phone]  Thank you for allowing the Palliative Medicine Team to assist in the care of this patient. Please utilize secure chat with additional questions, if there is no response within 30 minutes please call the above phone number.  Palliative Medicine Team providers are available by phone from 7am to 7pm daily and can be reached through the team cell phone.  Should this patient require assistance outside of these hours, please call the patient's attending physician.

## 2022-04-06 NOTE — Progress Notes (Signed)
PROGRESS NOTE    Levi [name]  PXT:[id number] DOB: July 27, 1929 DOA: 04/04/2022 PCP: Bernerd Limbo, MD  89/M with history of CAD/CABG, chronic diastolic CHF, COPD, chronic respiratory failure on 3 L O2, prostate cancer, paroxysmal A-fib recently discharged after hospitalization from COPD and CHF exacerbation, seen by palliative care then decision was made for DNR with outpatient palliative care follow-up. -Back to [facility] ED 8/3 with dyspnea and cough for 2 to 3 days, chest x-ray in ED noted persistent right upper lobe abnormality, CTA chest noted for X 3 cm right hilar mass invading mediastinum with associated mediastinal lymphadenopathy and interstitial spread of tumor in right upper lobe and right lower lobe along with postobstructive pneumonia. -Pulmonary and palliative care was consulted -Improving on antibiotics, steroids  Subjective: -breathing improving, +DoE  Assessment and Plan:  Acute on chronic respiratory failure with hypoxia (HCC) -Secondary to postobstructive pneumonia and COPD exacerbation -Continue IV ceftriaxone, azithromycin, IV Solu-Medrol -Switch to oral antibiotics and steroids tomorrow -Continue DuoNebs, pulmonary toilet -CPAP nightly  Sepsis due to pneumonia ([place]) -As above  Right lung mass -Highly suspicious for primary lung CA -Pulmonary consulting, not stable enough for bronchoscopy at this time and poor candidate for therapy regardless, seen by palliative care yesterday, decision made for medical optimization, defer biopsy and set up hospice services  Acute kidney injury superimposed on CKD ([place]) Baseline creatinine appears to be around 1.5-1.7 -Stable monitor  CAD with H/O coronary artery bypass surgery with elevated troponins Continue medical management with pravachol, eliquis, imdur   HLD (hyperlipidemia) Continue pravachol   PAF (paroxysmal atrial fibrillation) (HCC) - has pacemaker -Continue Eliquis -amiodarone recently stopped due to  elevated ESR and concern for pulmonary toxicity. (likely due to primary lung cancer)   Acute on chronic diastolic CHF 6/27: echo done shows EF 50-55% with normal diastolic function -Diuresed with IV Lasix yesterday, he is 4.6 L negative -Holding Jardiance and Aldactone  Essential hypertension -Decreased hydralazine dose, holding Aldactone and torsemide  Hypothyroidism TSH wnl 05/2021 Continue home synthroid at 124mg daily   OSA (obstructive sleep apnea) Continue cpap +oxygen at night   DVT prophylaxis: Eliquis Code Status: DNR Family Communication: Daughter at bedside Disposition Plan: Likely home with hospice services in 48 hours  Consultants:    Procedures:   Antimicrobials:    Objective: Vitals:   04/06/22 0002 04/06/22 0333 04/06/22 0500 04/06/22 0900  BP: (!) 163/76 (!) 162/75  (!) 146/83  Pulse: 75 76  77  Resp: _0 Temp: 98.2 F (36.8 C) 97.7 F (36.5 C)  (!) 97.3 F (36.3 C)  TempSrc: Axillary Oral  Axillary  SpO2: 95% 93%  93%  Weight:   122.8 kg   Height:        Intake/Output Summary (Last 24 hours) at 04/06/2022 1129 Last data filed at 04/06/2022 0333 Gross per 24 hour  Intake 380.77 ml  Output 2450 ml  Net -2069.23 ml   Filed Weights   04/04/22 1003 04/04/22 1947 04/06/22 0500  Weight: 122.9 kg 123 kg 122.8 kg    Examination:  Gen: obese,  Awake, Alert, Oriented X 3,  HEENT: no JVD Lungs: diffuse scattered ronchi CVS: S1S2/RRR Abd: soft, Non tender, non distended, BS present Extremities: No edema Skin: chronic skin changes  Data Reviewed:   CBC: Recent Labs  Lab 04/04/22 1009 04/04/22 1032 04/05/22 0307 04/06/22 0152  WBC 16.4*  --  13.1* 14.9*  HGB 15.6 16.3 14.5 14.2  HCT 47.9 48.0 43.5 44.3  MCV  95.2  --  93.8 95.3  PLT 249  --  220 983   Basic Metabolic Panel: Recent Labs  Lab 04/04/22 1009 04/04/22 1032 04/05/22 0307 04/06/22 0152  NA 137 136 137 134*  K 3.8 3.7 4.6 4.2  CL 95*  --  94* 95*  CO2 28  --   32 29  GLUCOSE 169*  --  120* 134*  BUN 40*  --  36* 41*  CREATININE 1.88*  --  1.71* 1.62*  CALCIUM 8.8*  --  9.1 8.7*   GFR: Estimated Creatinine Clearance: 38.4 mL/min (A) (by C-G formula based on SCr of 1.62 mg/dL (H)). Liver Function Tests: Recent Labs  Lab 04/04/22 1009  AST 39  ALT 36  ALKPHOS 95  BILITOT 0.9  PROT 6.9  ALBUMIN 3.3*   No results for input(s): "LIPASE", "AMYLASE" in the last 168 hours. No results for input(s): "AMMONIA" in the last 168 hours. Coagulation Profile: No results for input(s): "INR", "PROTIME" in the last 168 hours. Cardiac Enzymes: No results for input(s): "CKTOTAL", "CKMB", "CKMBINDEX", "TROPONINI" in the last 168 hours. BNP (last 3 results) Recent Labs    05/11/21 1025  PROBNP 1,997*   HbA1C: No results for input(s): "HGBA1C" in the last 72 hours. CBG: Recent Labs  Lab 04/05/22 1732  GLUCAP 155*   Lipid Profile: No results for input(s): "CHOL", "HDL", "LDLCALC", "TRIG", "CHOLHDL", "LDLDIRECT" in the last 72 hours. Thyroid Function Tests: No results for input(s): "TSH", "T4TOTAL", "FREET4", "T3FREE", "THYROIDAB" in the last 72 hours. Anemia Panel: No results for input(s): "VITAMINB12", "FOLATE", "FERRITIN", "TIBC", "IRON", "RETICCTPCT" in the last 72 hours. Urine analysis:    Component Value Date/Time   COLORURINE YELLOW 04/04/2022 2124   APPEARANCEUR CLEAR 04/04/2022 2124   LABSPEC 1.018 04/04/2022 2124   PHURINE 5.0 04/04/2022 2124   GLUCOSEU >=500 (A) 04/04/2022 2124   HGBUR NEGATIVE 04/04/2022 2124   BILIRUBINUR NEGATIVE 04/04/2022 2124   KETONESUR NEGATIVE 04/04/2022 2124   PROTEINUR NEGATIVE 04/04/2022 2124   UROBILINOGEN 0.2 03/01/2014 1440   NITRITE NEGATIVE 04/04/2022 2124   LEUKOCYTESUR NEGATIVE 04/04/2022 2124   Sepsis Labs: _0 (procalcitonin:4,lacticidven:4)  ) Recent Results (from the past 240 hour(s))  Resp Panel by RT-PCR (Flu A&B, Covid) Anterior Nasal Swab     Status: None   Collection Time:  04/04/22 10:00 AM   Specimen: Anterior Nasal Swab  Result Value Ref Range Status   SARS Coronavirus 2 by RT PCR NEGATIVE NEGATIVE Final    Comment: (NOTE) SARS-CoV-2 target nucleic acids are NOT DETECTED.  The SARS-CoV-2 RNA is generally detectable in upper respiratory specimens during the acute phase of infection. The lowest concentration of SARS-CoV-2 viral copies this assay can detect is 138 copies/mL. A negative result does not preclude SARS-Cov-2 infection and should not be used as the sole basis for treatment or other patient management decisions. A negative result may occur with  improper specimen collection/handling, submission of specimen other than nasopharyngeal swab, presence of viral mutation(s) within the areas targeted by this assay, and inadequate number of viral copies(<138 copies/mL). A negative result must be combined with clinical observations, patient history, and epidemiological information. The expected result is Negative.  Fact Sheet for Patients:  EntrepreneurPulse.com.au  Fact Sheet for Healthcare Providers:  IncredibleEmployment.be  This test is no t yet approved or cleared by the [place] FDA and  has been authorized for detection and/or diagnosis of SARS-CoV-2 by FDA under an Emergency Use Authorization (EUA). This EUA will remain  in effect (meaning  this test can be used) for the duration of the COVID-19 declaration under Section 564(b)(1) of the Act, 21 U.S.C.section 360bbb-3(b)(1), unless the authorization is terminated  or revoked sooner.       Influenza A by PCR NEGATIVE NEGATIVE Final   Influenza B by PCR NEGATIVE NEGATIVE Final    Comment: (NOTE) The Xpert Xpress SARS-CoV-2/FLU/RSV plus assay is intended as an aid in the diagnosis of influenza from Nasopharyngeal swab specimens and should not be used as a sole basis for treatment. Nasal washings and aspirates are unacceptable for Xpert Xpress  SARS-CoV-2/FLU/RSV testing.  Fact Sheet for Patients: EntrepreneurPulse.com.au  Fact Sheet for Healthcare Providers: IncredibleEmployment.be  This test is not yet approved or cleared by the [place] FDA and has been authorized for detection and/or diagnosis of SARS-CoV-2 by FDA under an Emergency Use Authorization (EUA). This EUA will remain in effect (meaning this test can be used) for the duration of the COVID-19 declaration under Section 564(b)(1) of the Act, 21 U.S.C. section 360bbb-3(b)(1), unless the authorization is terminated or revoked.  Performed at [facility], [place] [address], [place], [place] [address]   Respiratory (~20 pathogens) panel by PCR     Status: Abnormal   Collection Time: 04/04/22 10:00 AM   Specimen: Nasopharyngeal Swab; Respiratory  Result Value Ref Range Status   Adenovirus NOT DETECTED NOT DETECTED Final   Coronavirus 229E NOT DETECTED NOT DETECTED Final    Comment: (NOTE) The Coronavirus on the Respiratory Panel, DOES NOT test for the novel  Coronavirus (2019 nCoV)    Coronavirus HKU1 NOT DETECTED NOT DETECTED Final   Coronavirus NL63 NOT DETECTED NOT DETECTED Final   Coronavirus OC43 NOT DETECTED NOT DETECTED Final   Metapneumovirus NOT DETECTED NOT DETECTED Final   Rhinovirus / Enterovirus DETECTED (A) NOT DETECTED Final   Influenza A NOT DETECTED NOT DETECTED Final   Influenza B NOT DETECTED NOT DETECTED Final   Parainfluenza Virus 1 NOT DETECTED NOT DETECTED Final   Parainfluenza Virus 2 NOT DETECTED NOT DETECTED Final   Parainfluenza Virus 3 NOT DETECTED NOT DETECTED Final   Parainfluenza Virus 4 NOT DETECTED NOT DETECTED Final   Respiratory Syncytial Virus NOT DETECTED NOT DETECTED Final   Bordetella pertussis NOT DETECTED NOT DETECTED Final   Bordetella Parapertussis NOT DETECTED NOT DETECTED Final   Chlamydophila pneumoniae NOT DETECTED NOT DETECTED Final   Mycoplasma pneumoniae NOT  DETECTED NOT DETECTED Final    Comment: Performed at [facility], [address] [place], [place] [address]  Culture, blood (routine x 2)     Status: None (Preliminary result)   Collection Time: 04/04/22 10:33 AM   Specimen: BLOOD  Result Value Ref Range Status   Specimen Description BLOOD RIGHT ANTECUBITAL  Final   Special Requests   Final    BOTTLES DRAWN AEROBIC AND ANAEROBIC Blood Culture results may not be optimal due to an excessive volume of blood received in culture bottles   Culture   Final    NO GROWTH 2 DAYS Performed at [facility], [place] [address], [place], [place] [address]    Report Status PENDING  Incomplete  Culture, blood (routine x 2)     Status: None (Preliminary result)   Collection Time: 04/04/22 10:43 AM   Specimen: BLOOD LEFT FOREARM  Result Value Ref Range Status   Specimen Description BLOOD LEFT FOREARM  Final   Special Requests   Final    BOTTLES DRAWN AEROBIC AND ANAEROBIC Blood Culture  adequate volume   Culture   Final    NO GROWTH 2 DAYS Performed at [facility], [place] [address], [place], [place] [address]    Report Status PENDING  Incomplete  Expectorated Sputum Assessment w Gram Stain, Rflx to Resp Cult     Status: None   Collection Time: 04/05/22  8:31 AM   Specimen: Sputum  Result Value Ref Range Status   Specimen Description SPUTUM  Final   Special Requests NONE  Final   Sputum evaluation   Final    THIS SPECIMEN IS ACCEPTABLE FOR SPUTUM CULTURE Performed at [facility], [place] [address], [place], [place] [address]    Report Status 04/05/2022 FINAL  Final  Culture, Respiratory w Gram Stain     Status: None (Preliminary result)   Collection Time: 04/05/22  8:31 AM   Specimen: SPU  Result Value Ref Range Status   Specimen Description SPUTUM  Final   Special Requests NONE Reflexed from [id number]  Final   Gram Stain   Final    ABUNDANT WBC PRESENT,BOTH PMN AND MONONUCLEAR RARE GRAM POSITIVE COCCI IN PAIRS Performed at  [facility], [place] [address], [place], [place] [address]    Culture PENDING  Incomplete   Report Status PENDING  Incomplete     Radiology Studies: DG CHEST PORT 1 VIEW  Result Date: 04/05/2022 CLINICAL DATA:  Cough. EXAM: PORTABLE CHEST 1 VIEW COMPARISON:  April 04, 2022. FINDINGS: Stable cardiomediastinal silhouette. Status post coronary bypass graft. Left-sided pacemaker is unchanged in position. Stable right upper lobe airspace opacity is noted consistent with pneumonia. Minimal bibasilar subsegmental atelectasis is noted. Bony thorax is unremarkable. IMPRESSION: Stable right upper lobe airspace opacity is noted concerning for pneumonia. Electronically Signed   By: Marijo Conception M.D.   On: 04/05/2022 10:21   CT Angio Chest PE W and/or Wo Contrast  Result Date: 04/04/2022 CLINICAL DATA:  2-3 day history of shortness of breath. EXAM: CT ANGIOGRAPHY CHEST WITH CONTRAST TECHNIQUE: Multidetector CT imaging of the chest was performed using the standard protocol during bolus administration of intravenous contrast. Multiplanar CT image reconstructions and MIPs were obtained to evaluate the vascular anatomy. RADIATION DOSE REDUCTION: This exam was performed according to the departmental dose-optimization program which includes automated exposure control, adjustment of the mA and/or kV according to patient size and/or use of iterative reconstruction technique. CONTRAST:  65m OMNIPAQUE IOHEXOL 350 MG/ML SOLN COMPARISON:  Chest x-ray, same date. FINDINGS: Cardiovascular: The heart is upper limits of normal in size given the patient's age. No pericardial effusion. The aorta is normal in caliber. No dissection. Moderate to advanced atherosclerotic calcifications. Extensive coronary artery calcifications and evidence of prior coronary artery bypass surgery. Left atrial closure device noted. The pulmonary arteries are fairly well opacified. No filling defects to suggest pulmonary embolism. Mediastinum/Nodes:  Right hilar mass and extensive mediastinal lymphadenopathy. 2.5 cm right paratracheal node on image 37/5. 2.5 cm subcarinal node on image 51/5. Extensive hilar and infrahilar adenopathy. The esophagus is grossly normal. Lungs/Pleura: Dense right upper lobe and right lower lobe airspace consolidation likely postobstructive pneumonitis but could not exclude some component interstitial spread of tumor. Underlying chronic bronchitic type changes. I do not see any obvious metastatic pulmonary nodules. There is a small right pleural effusion noted. Upper Abdomen: No significant upper abdominal findings. No obvious hepatic metastatic disease. The adrenal glands are not included. Musculoskeletal: No significant bony findings. No supraclavicular or axillary adenopathy. Thyroid gland is unremarkable. Review  of the MIP images confirms the above findings. IMPRESSION: 1. 4 x 3 cm right hilar mass invading the mediastinum. Associated extensive mediastinal lymphadenopathy and probable postobstructive pneumonitis and interstitial spread of tumor in the right upper lobe and right lower lobe. PET-CT suggested for further evaluation and referral to multi disciplinary thoracic Oncology clinic. Patient may require bronchoscopic biopsy for tissue diagnosis. 2. No CT findings for pulmonary embolism. 3. No findings for upper abdominal or osseous metastatic disease. Aortic Atherosclerosis (ICD10-I70.0) and Emphysema (ICD10-J43.9). Electronically Signed   By: Marijo Sanes M.D.   On: 04/04/2022 12:53     Scheduled Meds:  apixaban  2.5 mg Oral BID   benzonatate  200 mg Oral TID   hydrALAZINE  25 mg Oral q12n4p   levothyroxine  112 mcg Oral QAC breakfast   methylPREDNISolone (SOLU-MEDROL) injection  60 mg Intravenous Q12H   pravastatin  20 mg Oral Daily   sodium chloride flush  3 mL Intravenous Q12H   tamsulosin  0.4 mg Oral QHS   Continuous Infusions:  sodium chloride     azithromycin (ZITHROMAX) 500 mg in sodium chloride 0.9 %  250 mL IVPB 500 mg (04/05/22 1802)   cefTRIAXone (ROCEPHIN)  IV 1 g (04/05/22 0824)     LOS: 2 days    Time spent: 66mn   PDomenic Polite MD [facility]   04/06/2022, 11:29 AM

## 2022-04-06 NOTE — Progress Notes (Signed)
Pt has home CPAP, no assistance needed.

## 2022-04-06 NOTE — Progress Notes (Signed)
[facility]) Hospital Liaison RN note  Received request from [facility] for hospice services at home after discharge. Chart and patient information under review by Hospice physician.   Spoke with [name] to initiate education related to hospice philosophy, services, and team approach to care. Patient/family verbalized understanding of information given.   Per discussion, the plan is for discharge home possibly Monday.   DME needs discussed. At this time there are no immediate DME needs and any additional needs will be addressed when nurse goes out.   Please send signed and completed DNR with patient/family. Please provide symptoms at discharge as needed for ongoing symptom management.   [facility] information and contact numbers given to [name].  Above information shared with TOC . Please call with any hospice related questions or concerns.   Thank you for the opportunity to participate in this patient's care.  Jhonnie Garner, Therapist, sports, BSN [employer] [phone]

## 2022-04-06 NOTE — Plan of Care (Signed)
  Problem: Activity: Goal: Ability to tolerate increased activity will improve Outcome: Not Progressing   Problem: Respiratory: Goal: Levels of oxygenation will improve Outcome: Not Progressing   Problem: Respiratory: Goal: Ability to maintain adequate ventilation will improve Outcome: Not Progressing

## 2022-04-07 DIAGNOSIS — J9621 Acute and chronic respiratory failure with hypoxia: Secondary | ICD-10-CM | POA: Diagnosis not present

## 2022-04-07 LAB — BASIC METABOLIC PANEL
Anion gap: 12 (ref 5–15)
BUN: 43 mg/dL — ABNORMAL HIGH (ref 8–23)
CO2: 27 mmol/L (ref 22–32)
Calcium: 8.9 mg/dL (ref 8.9–10.3)
Chloride: 95 mmol/L — ABNORMAL LOW (ref 98–111)
Creatinine, Ser: 1.49 mg/dL — ABNORMAL HIGH (ref 0.61–1.24)
GFR, Estimated: 44 mL/min — ABNORMAL LOW (ref >60)
Glucose, Bld: 135 mg/dL — ABNORMAL HIGH (ref 70–99)
Potassium: 4 mmol/L (ref 3.5–5.1)
Sodium: 134 mmol/L — ABNORMAL LOW (ref 135–145)

## 2022-04-07 LAB — CULTURE, RESPIRATORY W GRAM STAIN: Culture: NORMAL

## 2022-04-07 MED ORDER — PREDNISONE 20 MG PO TABS
40.0000 mg | ORAL_TABLET | Freq: Every day | ORAL | Status: DC
  Administered 2022-04-08: 40 mg via ORAL
  Filled 2022-04-07: qty 2

## 2022-04-07 MED ORDER — CEFDINIR 300 MG PO CAPS
300.0000 mg | ORAL_CAPSULE | Freq: Two times a day (BID) | ORAL | Status: DC
  Administered 2022-04-07 – 2022-04-08 (×2): 300 mg via ORAL
  Filled 2022-04-07 (×3): qty 1

## 2022-04-07 MED ORDER — METHYLPREDNISOLONE SODIUM SUCC 125 MG IJ SOLR
60.0000 mg | Freq: Two times a day (BID) | INTRAMUSCULAR | Status: AC
  Administered 2022-04-07: 60 mg via INTRAVENOUS
  Filled 2022-04-07: qty 2

## 2022-04-07 MED ORDER — POTASSIUM CHLORIDE CRYS ER 20 MEQ PO TBCR
40.0000 meq | EXTENDED_RELEASE_TABLET | Freq: Every day | ORAL | Status: DC
  Administered 2022-04-07 – 2022-04-08 (×2): 40 meq via ORAL
  Filled 2022-04-07 (×2): qty 2

## 2022-04-07 MED ORDER — TORSEMIDE 20 MG PO TABS
60.0000 mg | ORAL_TABLET | Freq: Two times a day (BID) | ORAL | Status: DC
  Administered 2022-04-07 – 2022-04-08 (×3): 60 mg via ORAL
  Filled 2022-04-07 (×3): qty 3

## 2022-04-07 NOTE — Progress Notes (Signed)
[facility]) Hospital Liaison    [place] continues to follow patient through hospitalization.   If applicable, please send signed and completed DNR with patient/family upon discharge. Please provide prescriptions at discharge as needed to ensure ongoing symptom management and a transport packet.   [facility] information and contact numbers given to family and above information shared with TOC.    Please call with any questions/concerns.    Thank you for the opportunity to participate in this patient's care   Daphene Calamity, MSW [facility] Liaison  [phone]

## 2022-04-07 NOTE — Progress Notes (Signed)
Pt has home CPAP, 2L bleed in.

## 2022-04-07 NOTE — Progress Notes (Signed)
PROGRESS NOTE    Levi [name]  YBF:[id number] DOB: 10/16/1928 DOA: 04/04/2022 PCP: Bernerd Limbo, MD  89/M with history of CAD/CABG, chronic diastolic CHF, COPD, chronic respiratory failure on 3 L O2, prostate cancer, paroxysmal A-fib recently discharged after hospitalization from COPD and CHF exacerbation, seen by palliative care then decision was made for DNR with outpatient palliative care follow-up. -Back to [facility] ED 8/3 with dyspnea and cough for 2 to 3 days, chest x-ray in ED noted persistent right upper lobe abnormality, CTA chest noted for X 3 cm right hilar mass invading mediastinum with associated mediastinal lymphadenopathy and interstitial spread of tumor in right upper lobe and right lower lobe along with postobstructive pneumonia. -Pulmonary and palliative care was consulted -Improving on antibiotics, steroids -8/4, palliative care meeting, decision made to optimize medically, defer biopsy and home with hospice when stable  Subjective: -Feels better, breathing is improving, intermittent cough  Assessment and Plan:  Acute on chronic respiratory failure with hypoxia ([place]) -Secondary to postobstructive pneumonia and COPD exacerbation -Treated with IV ceftriaxone, azithromycin, IV Solu-Medrol -Switch to oral cefdinir and steroids -Continue DuoNebs, pulmonary toilet -CPAP nightly -Ambulate, discharge planning  Sepsis due to pneumonia ([place]) -As above  Right lung mass -Highly suspicious for primary lung CA -Pulmonary consulting, not stable enough for bronchoscopy at this time and poor candidate for therapy regardless, seen by palliative care 8/4, decision made for medical optimization, defer biopsy and set up hospice services  Acute kidney injury superimposed on CKD ([place]) Baseline creatinine appears to be around 1.5-1.7 -Stable monitor  CAD with H/O coronary artery bypass surgery with elevated troponins Continue medical management with pravachol, eliquis, imdur   HLD  (hyperlipidemia) Continue pravachol   PAF (paroxysmal atrial fibrillation) (HCC) - has pacemaker -Continue Eliquis -amiodarone recently stopped due to elevated ESR and concern for pulmonary toxicity. (likely due to primary lung cancer)   Acute on chronic diastolic CHF 6/06: echo done shows EF 50-55% with normal diastolic function -Diuresed with IV Lasix, he is 5.2 L negative, volume status has improved -Start torsemide -Holding Jardiance and Aldactone  Essential hypertension -Restarted Aldactone, continue torsemide  Hypothyroidism TSH wnl 05/2021 Continue home synthroid at 156mg daily   OSA (obstructive sleep apnea) Continue cpap +oxygen at night   DVT prophylaxis: Eliquis Code Status: DNR Family Communication: Daughter at bedside Disposition Plan: Likely home with hospice services likely 1 to 2 days  Consultants:    Procedures:   Antimicrobials:    Objective: Vitals:   04/07/22 0046 04/07/22 0458 04/07/22 0500 04/07/22 0844  BP: (!) 153/75 (!) 157/80 (!) 157/80 (!) 162/75  Pulse: 72 73 74 83  Resp: _0 (!) 21  Temp: 97.7 F (36.5 C) 97.8 F (36.6 C)  97.8 F (36.6 C)  TempSrc: Oral Oral  Oral  SpO2: 91% 95% 95% 91%  Weight:   120.1 kg   Height:        Intake/Output Summary (Last 24 hours) at 04/07/2022 1019 Last data filed at 04/07/2022 0219 Gross per 24 hour  Intake 348.9 ml  Output 950 ml  Net -601.1 ml   Filed Weights   04/04/22 1947 04/06/22 0500 04/07/22 0500  Weight: 123 kg 122.8 kg 120.1 kg    Examination:  General: Obese chronically ill male sitting up in chair, AAOx3, no distress HEENT: No JVD CVS: S1-S2, regular rhythm Lungs: Diffuse scattered rhonchi Abdomen: Soft, nontender, bowel sounds present Extremities: No edema Skin: chronic skin changes  Data Reviewed:   CBC: Recent Labs  Lab 04/04/22 1009 04/04/22 1032 04/05/22 0307 04/06/22 0152  WBC 16.4*  --  13.1* 14.9*  HGB 15.6 16.3 14.5 14.2  HCT 47.9 48.0 43.5 44.3   MCV 95.2  --  93.8 95.3  PLT 249  --  220 081   Basic Metabolic Panel: Recent Labs  Lab 04/04/22 1009 04/04/22 1032 04/05/22 0307 04/06/22 0152 04/07/22 0254  NA 137 136 137 134* 134*  K 3.8 3.7 4.6 4.2 4.0  CL 95*  --  94* 95* 95*  CO2 28  --  32 29 27  GLUCOSE 169*  --  120* 134* 135*  BUN 40*  --  36* 41* 43*  CREATININE 1.88*  --  1.71* 1.62* 1.49*  CALCIUM 8.8*  --  9.1 8.7* 8.9   GFR: Estimated Creatinine Clearance: 41.3 mL/min (A) (by C-G formula based on SCr of 1.49 mg/dL (H)). Liver Function Tests: Recent Labs  Lab 04/04/22 1009  AST 39  ALT 36  ALKPHOS 95  BILITOT 0.9  PROT 6.9  ALBUMIN 3.3*   No results for input(s): "LIPASE", "AMYLASE" in the last 168 hours. No results for input(s): "AMMONIA" in the last 168 hours. Coagulation Profile: No results for input(s): "INR", "PROTIME" in the last 168 hours. Cardiac Enzymes: No results for input(s): "CKTOTAL", "CKMB", "CKMBINDEX", "TROPONINI" in the last 168 hours. BNP (last 3 results) Recent Labs    05/11/21 1025  PROBNP 1,997*   HbA1C: No results for input(s): "HGBA1C" in the last 72 hours. CBG: Recent Labs  Lab 04/05/22 1732  GLUCAP 155*   Lipid Profile: No results for input(s): "CHOL", "HDL", "LDLCALC", "TRIG", "CHOLHDL", "LDLDIRECT" in the last 72 hours. Thyroid Function Tests: No results for input(s): "TSH", "T4TOTAL", "FREET4", "T3FREE", "THYROIDAB" in the last 72 hours. Anemia Panel: No results for input(s): "VITAMINB12", "FOLATE", "FERRITIN", "TIBC", "IRON", "RETICCTPCT" in the last 72 hours. Urine analysis:    Component Value Date/Time   COLORURINE YELLOW 04/04/2022 2124   APPEARANCEUR CLEAR 04/04/2022 2124   LABSPEC 1.018 04/04/2022 2124   PHURINE 5.0 04/04/2022 2124   GLUCOSEU >=500 (A) 04/04/2022 2124   HGBUR NEGATIVE 04/04/2022 2124   BILIRUBINUR NEGATIVE 04/04/2022 2124   KETONESUR NEGATIVE 04/04/2022 2124   PROTEINUR NEGATIVE 04/04/2022 2124   UROBILINOGEN 0.2 03/01/2014 1440    NITRITE NEGATIVE 04/04/2022 2124   LEUKOCYTESUR NEGATIVE 04/04/2022 2124   Sepsis Labs: _0 (procalcitonin:4,lacticidven:4)  ) Recent Results (from the past 240 hour(s))  Resp Panel by RT-PCR (Flu A&B, Covid) Anterior Nasal Swab     Status: None   Collection Time: 04/04/22 10:00 AM   Specimen: Anterior Nasal Swab  Result Value Ref Range Status   SARS Coronavirus 2 by RT PCR NEGATIVE NEGATIVE Final    Comment: (NOTE) SARS-CoV-2 target nucleic acids are NOT DETECTED.  The SARS-CoV-2 RNA is generally detectable in upper respiratory specimens during the acute phase of infection. The lowest concentration of SARS-CoV-2 viral copies this assay can detect is 138 copies/mL. A negative result does not preclude SARS-Cov-2 infection and should not be used as the sole basis for treatment or other patient management decisions. A negative result may occur with  improper specimen collection/handling, submission of specimen other than nasopharyngeal swab, presence of viral mutation(s) within the areas targeted by this assay, and inadequate number of viral copies(<138 copies/mL). A negative result must be combined with clinical observations, patient history, and epidemiological information. The expected result is Negative.  Fact Sheet for Patients:  EntrepreneurPulse.com.au  Fact Sheet for Healthcare Providers:  IncredibleEmployment.be  This test is no t yet approved or cleared by the [place] and  has been authorized for detection and/or diagnosis of SARS-CoV-2 by FDA under an Emergency Use Authorization (EUA). This EUA will remain  in effect (meaning this test can be used) for the duration of the COVID-19 declaration under Section 564(b)(1) of the Act, 21 U.S.C.section 360bbb-3(b)(1), unless the authorization is terminated  or revoked sooner.       Influenza A by PCR NEGATIVE NEGATIVE Final   Influenza B by PCR NEGATIVE NEGATIVE Final     Comment: (NOTE) The Xpert Xpress SARS-CoV-2/FLU/RSV plus assay is intended as an aid in the diagnosis of influenza from Nasopharyngeal swab specimens and should not be used as a sole basis for treatment. Nasal washings and aspirates are unacceptable for Xpert Xpress SARS-CoV-2/FLU/RSV testing.  Fact Sheet for Patients: EntrepreneurPulse.com.au  Fact Sheet for Healthcare Providers: IncredibleEmployment.be  This test is not yet approved or cleared by the [place] FDA and has been authorized for detection and/or diagnosis of SARS-CoV-2 by FDA under an Emergency Use Authorization (EUA). This EUA will remain in effect (meaning this test can be used) for the duration of the COVID-19 declaration under Section 564(b)(1) of the Act, 21 U.S.C. section 360bbb-3(b)(1), unless the authorization is terminated or revoked.  Performed at [facility], [place] [address] [place], [place] [address]   Respiratory (~20 pathogens) panel by PCR     Status: Abnormal   Collection Time: 04/04/22 10:00 AM   Specimen: Nasopharyngeal Swab; Respiratory  Result Value Ref Range Status   Adenovirus NOT DETECTED NOT DETECTED Final   Coronavirus 229E NOT DETECTED NOT DETECTED Final    Comment: (NOTE) The Coronavirus on the Respiratory Panel, DOES NOT test for the novel  Coronavirus (2019 nCoV)    Coronavirus HKU1 NOT DETECTED NOT DETECTED Final   Coronavirus NL63 NOT DETECTED NOT DETECTED Final   Coronavirus OC43 NOT DETECTED NOT DETECTED Final   Metapneumovirus NOT DETECTED NOT DETECTED Final   Rhinovirus / Enterovirus DETECTED (A) NOT DETECTED Final   Influenza A NOT DETECTED NOT DETECTED Final   Influenza B NOT DETECTED NOT DETECTED Final   Parainfluenza Virus 1 NOT DETECTED NOT DETECTED Final   Parainfluenza Virus 2 NOT DETECTED NOT DETECTED Final   Parainfluenza Virus 3 NOT DETECTED NOT DETECTED Final   Parainfluenza Virus 4 NOT DETECTED NOT DETECTED Final    Respiratory Syncytial Virus NOT DETECTED NOT DETECTED Final   Bordetella pertussis NOT DETECTED NOT DETECTED Final   Bordetella Parapertussis NOT DETECTED NOT DETECTED Final   Chlamydophila pneumoniae NOT DETECTED NOT DETECTED Final   Mycoplasma pneumoniae NOT DETECTED NOT DETECTED Final    Comment: Performed at [facility], [address], [place], [place] [address]  Culture, blood (routine x 2)     Status: None (Preliminary result)   Collection Time: 04/04/22 10:33 AM   Specimen: BLOOD  Result Value Ref Range Status   Specimen Description BLOOD RIGHT ANTECUBITAL  Final   Special Requests   Final    BOTTLES DRAWN AEROBIC AND ANAEROBIC Blood Culture results may not be optimal due to an excessive volume of blood received in culture bottles   Culture   Final    NO GROWTH 3 DAYS Performed at [facility], [place] [address], [place], [place] [address]    Report Status PENDING  Incomplete  Culture, blood (routine x 2)     Status: None (Preliminary result)   Collection Time:  04/04/22 10:43 AM   Specimen: BLOOD LEFT FOREARM  Result Value Ref Range Status   Specimen Description BLOOD LEFT FOREARM  Final   Special Requests   Final    BOTTLES DRAWN AEROBIC AND ANAEROBIC Blood Culture adequate volume   Culture   Final    NO GROWTH 3 DAYS Performed at [facility], [address], [place], [place] [address]    Report Status PENDING  Incomplete  Expectorated Sputum Assessment w Gram Stain, Rflx to Resp Cult     Status: None   Collection Time: 04/05/22  8:31 AM   Specimen: Sputum  Result Value Ref Range Status   Specimen Description SPUTUM  Final   Special Requests NONE  Final   Sputum evaluation   Final    THIS SPECIMEN IS ACCEPTABLE FOR SPUTUM CULTURE Performed at [facility], [place] [address] [place], [place] [address]    Report Status 04/05/2022 FINAL  Final  Culture, Respiratory w Gram Stain     Status: None (Preliminary result)   Collection Time: 04/05/22   8:31 AM   Specimen: SPU  Result Value Ref Range Status   Specimen Description SPUTUM  Final   Special Requests NONE Reflexed from [id number]  Final   Gram Stain   Final    ABUNDANT WBC PRESENT,BOTH PMN AND MONONUCLEAR RARE GRAM POSITIVE COCCI IN PAIRS    Culture   Final    CULTURE REINCUBATED FOR BETTER GROWTH Performed at [facility], [place] [address] [place], [place] [address]    Report Status PENDING  Incomplete     Radiology Studies: No results found.   Scheduled Meds:  apixaban  2.5 mg Oral BID   benzonatate  200 mg Oral TID   hydrALAZINE  25 mg Oral q12n4p   levothyroxine  112 mcg Oral QAC breakfast   methylPREDNISolone (SOLU-MEDROL) injection  60 mg Intravenous Q12H   pravastatin  20 mg Oral Daily   sodium chloride flush  3 mL Intravenous Q12H   tamsulosin  0.4 mg Oral QHS   Continuous Infusions:  sodium chloride     azithromycin (ZITHROMAX) 500 mg in sodium chloride 0.9 % 250 mL IVPB Stopped (04/06/22 1904)   cefTRIAXone (ROCEPHIN)  IV 1 g (04/07/22 0842)     LOS: 3 days    Time spent: 71mn   PDomenic Polite MD [facility]   04/07/2022, 10:19 AM

## 2022-04-08 ENCOUNTER — Telehealth: Payer: Self-pay | Admitting: *Deleted

## 2022-04-08 ENCOUNTER — Ambulatory Visit: Payer: Medicare Other | Admitting: Internal Medicine

## 2022-04-08 ENCOUNTER — Other Ambulatory Visit (HOSPITAL_COMMUNITY): Payer: Self-pay

## 2022-04-08 DIAGNOSIS — J9621 Acute and chronic respiratory failure with hypoxia: Secondary | ICD-10-CM | POA: Diagnosis not present

## 2022-04-08 DIAGNOSIS — Z79899 Other long term (current) drug therapy: Secondary | ICD-10-CM

## 2022-04-08 LAB — BASIC METABOLIC PANEL
Anion gap: 17 — ABNORMAL HIGH (ref 5–15)
BUN: 46 mg/dL — ABNORMAL HIGH (ref 8–23)
CO2: 31 mmol/L (ref 22–32)
Calcium: 8.9 mg/dL (ref 8.9–10.3)
Chloride: 92 mmol/L — ABNORMAL LOW (ref 98–111)
Creatinine, Ser: 1.58 mg/dL — ABNORMAL HIGH (ref 0.61–1.24)
GFR, Estimated: 41 mL/min — ABNORMAL LOW (ref >60)
Glucose, Bld: 146 mg/dL — ABNORMAL HIGH (ref 70–99)
Potassium: 4.4 mmol/L (ref 3.5–5.1)
Sodium: 140 mmol/L (ref 135–145)

## 2022-04-08 LAB — CBC
HCT: 45.2 % (ref 39.0–52.0)
Hemoglobin: 14.8 g/dL (ref 13.0–17.0)
MCH: 31 pg (ref 26.0–34.0)
MCHC: 32.7 g/dL (ref 30.0–36.0)
MCV: 94.8 fL (ref 80.0–100.0)
Platelets: 219 10*3/uL (ref 150–400)
RBC: 4.77 MIL/uL (ref 4.22–5.81)
RDW: 15.4 % (ref 11.5–15.5)
WBC: 13.6 10*3/uL — ABNORMAL HIGH (ref 4.0–10.5)
nRBC: 0 % (ref 0.0–0.2)

## 2022-04-08 LAB — LEGIONELLA PNEUMOPHILA SEROGP 1 UR AG: L. pneumophila Serogp 1 Ur Ag: NEGATIVE

## 2022-04-08 LAB — GLUCOSE, CAPILLARY: Glucose-Capillary: 114 mg/dL — ABNORMAL HIGH (ref 70–99)

## 2022-04-08 MED ORDER — BENZONATATE 200 MG PO CAPS
200.0000 mg | ORAL_CAPSULE | Freq: Two times a day (BID) | ORAL | 0 refills | Status: AC
  Filled 2022-04-08: qty 20, 10d supply, fill #0

## 2022-04-08 MED ORDER — CEFDINIR 300 MG PO CAPS
300.0000 mg | ORAL_CAPSULE | Freq: Two times a day (BID) | ORAL | 0 refills | Status: AC
  Filled 2022-04-08: qty 6, 3d supply, fill #0

## 2022-04-08 MED ORDER — MORPHINE SULFATE (CONCENTRATE) 10 MG /0.5 ML PO SOLN
5.0000 mg | ORAL | 0 refills | Status: AC | PRN
  Filled 2022-04-08: qty 10, 7d supply, fill #0

## 2022-04-08 MED ORDER — PREDNISONE 20 MG PO TABS
20.0000 mg | ORAL_TABLET | Freq: Every day | ORAL | 0 refills | Status: AC
  Filled 2022-04-08: qty 4, 4d supply, fill #0

## 2022-04-08 NOTE — Telephone Encounter (Signed)
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 Doing well. Hope you are. I will go ahead and refer him to pulmonology. He has f/u with me this month and Dr. Harrington Challenger in Oct. 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   I saw Levi [name] for the first time in [facility] clinic, following 2 back to back hospitalizations for acute hypoxic respiratory failure, with new supp O2 requirements (now on home O2).   He was treated both times for suspected CHF and possible component of COPDE. However I think more pulmonary than HF. Dyspnea out of proportion to degree of HF and volume status. He has been on amiodarone and has underlying lung disease at baseline. Recent imaging also suggestive of scaring/fibrosis. I checked ESR and it was elevated at 87, further supporting APT diagnosis.   He is being followed by outpatient palliative care service but I feel it is reasonable to treat w/ course of prednisone for palliation to see he we can improve his breathing. I have discontinued amiodarone and ordered 40 mg of prednisone daily. He is scheduled to f/u w/ you in 2 wks. Hopefully symptoms will improve and you two can discuss possible referral to pulmonology if needed. Will need slow prednisone wean.   Thanks,  [employer]

## 2022-04-08 NOTE — Care Management Important Message (Signed)
Important Message  Patient Details  Name: Levi [name] MRN: [id number] Date of Birth: [birth year]   Medicare Important Message Given:  Yes     Shelda Altes 04/08/2022, 11:13 AM

## 2022-04-08 NOTE — TOC Transition Note (Signed)
Transition of Care (TOC) - CM/SW Discharge Note Marvetta Gibbons RN, BSN Transitions of Care Unit 4E- RN Case Manager See Treatment Team for direct phone #    Patient Details  Name: Levi [name] MRN: [id number] Date of Birth: 1928/12/28  Transition of Care [facility]) CM/SW Contact:  Dawayne Patricia, RN Phone Number: 04/08/2022, 12:02 PM   Clinical Narrative:    Pt stable to return home today w/ family. Has needed DME at home including home 02 and hospital bed.  Pt is active w/ [facility] for HHRN/PT and Palliative care will ask MD fo resumption orders.  1030- received msg from [facility]-hospital liaison S. Wicker regarding d/c home today w/ home hospice- CM was not aware of pt going home w/ hospice and no TOC referral noted regarding hospice choice. Reviewed note on 8/5 from [facility] and note plans to return home w/ Hospice, will f/u with Hoyle Sauer at [facility] and Hospice as well.   1100- Spoke w/ Hoyle Sauer at [facility]- she is at the bedside w/ son and they are stating that they want to continue services with [facility] and change over from [place] to Hospice with them. CM has also received msg from S. Wicker at [facility] that they have received same msg from family stating that they want to continue w/ [facility]. [facility]) will cancel referral for home hospice. Will coordinate Home Hospice care w/ [facility]ospice as per family choice. No DME needs per Hoyle Sauer. Family would like to transport by EMS- will await meds from [place], and pt needs GOLD DNR signed.   22- spoke w/ Hoyle Sauer, son now has decided he will transport pt home via private vehicle - has portable 02 for transport.  Await MD to sign GOLD DNR to provide son for home, and TOC meds. - bedside RN updated on plan.  [facility] will see pt this afternoon.    Final next level of care: Home w Hospice Care Barriers to Discharge: No Barriers Identified   Patient Goals and CMS Choice Patient states their goals for this  hospitalization and ongoing recovery are:: return home CMS Medicare.gov Compare Post Acute Care list provided to:: Patient Represenative (must comment) Choice offered to / list presented to : Adult Children  Discharge Placement               Home w/ Hospice.         Discharge Plan and Services   Discharge Planning Services: CM Consult Post Acute Care Choice: Home Health, Hospice, Resumption of Svcs/PTA Provider (Active with [facility])          DME Arranged: N/A DME Agency: NA       HH Arranged: Disease Management [place] Agency: [facility] ([facility]) Date HH Agency Contacted: 04/08/22 Time [place]: 1100 Representative spoke with at [place]: [place] ([place]) Interventions     Readmission Risk Interventions    04/08/2022   11:28 AM  Readmission Risk Prevention Plan  Transportation Screening Complete  PCP or Specialist Appt within 5-7 Days Complete  Home Care Screening Complete  Medication Review (RN CM) Complete

## 2022-04-09 ENCOUNTER — Encounter: Payer: Self-pay | Admitting: Internal Medicine

## 2022-04-09 LAB — CULTURE, BLOOD (ROUTINE X 2)
Culture: NO GROWTH
Culture: NO GROWTH
Special Requests: ADEQUATE

## 2022-04-09 NOTE — Addendum Note (Signed)
Addended by: Gaetano Net on: 04/09/2022 08:04 AM   Modules accepted: Orders

## 2022-04-09 NOTE — Discharge Summary (Signed)
Physician Discharge Summary  Levi [name] Levi [name]:[id number] DOB: Apr 14, 1929 DOA: 04/04/2022  PCP: Bernerd Limbo, MD  Admit date: 04/04/2022 Discharge date: 04/09/2022  Time spent: 35 minutes  Recommendations for Outpatient Follow-up:  Home with hospice services for symptom focused care   Discharge Diagnoses:  Principal Problem:   Acute on chronic respiratory failure with hypoxia (HCC) Lung mass, most likely primary lung CA Postobstructive pneumonia Acute on chronic diastolic CHF COPD Chronic respiratory failure on 3 L home O2   Pneumonia of right upper lobe due to infectious organism   Acute kidney injury superimposed on CKD ([place])   Mass of right lung   CAD with H/O coronary artery bypass surgery with elevated troponins   HLD (hyperlipidemia)   PAF (paroxysmal atrial fibrillation) (HCC)   Heart failure with preserved ejection fraction ([place])   Essential hypertension   Hypothyroidism   OSA (obstructive sleep apnea)   Discharge Condition: Stable  Diet recommendation: Low-sodium, heart healthy  Filed Weights   04/06/22 0500 04/07/22 0500 04/08/22 0500  Weight: 122.8 kg 120.1 kg 125.3 kg    History of present illness:  89/M with history of CAD/CABG, chronic diastolic CHF, COPD, chronic respiratory failure on 3 L O2, prostate cancer, paroxysmal A-fib recently discharged after hospitalization from COPD and CHF exacerbation, seen by palliative care then decision was made for DNR with outpatient palliative care follow-up. -Back to [facility] ED 8/3 with dyspnea and cough for 2 to 3 days, chest x-ray in ED noted persistent right upper lobe abnormality, CTA chest noted for X 3 cm right hilar mass invading mediastinum with associated mediastinal lymphadenopathy and interstitial spread of tumor in right upper lobe and right lower lobe along with postobstructive pneumonia. -Pulmonary and palliative care was consulted -Improving on antibiotics, steroids -8/4, palliative care meeting, decision  made to optimize medically, defer biopsy and home with hospice when stable  Hospital Course:   Acute on chronic respiratory failure with hypoxia ([name]) -Secondary to postobstructive pneumonia and COPD exacerbation -Treated with IV ceftriaxone, azithromycin, IV Solu-Medrol -Switched to oral cefdinir and steroids -Continue DuoNebs, pulmonary toilet -CPAP nightly -Discharged home with hospice services today, no plan for work-up of lung mass   Sepsis due to pneumonia ([name]) -As above   Right lung mass -Highly suspicious for primary lung CA -Pulmonary consulting, not stable enough for bronchoscopy initially and poor candidate for therapy regardless, seen by palliative care 8/4, decision made for medical optimization, defer biopsy and set up hospice services at discharge   Acute kidney injury superimposed on CKD ([name]) Baseline creatinine appears to be around 1.5-1.7 -Stable monitor   CAD with H/O coronary artery bypass surgery with elevated troponins Continue medical management with pravachol, eliquis, imdur    HLD (hyperlipidemia) Continue pravachol    PAF (paroxysmal atrial fibrillation) (HCC) - has pacemaker -Continue Eliquis -amiodarone recently stopped due to elevated ESR and concern for pulmonary toxicity. (likely due to primary lung cancer)    Acute on chronic diastolic CHF 8/65: echo done shows EF 50-55% with normal diastolic function -Diuresed with IV Lasix, he is 5.2 L negative, volume status has improved -Resumed torsemide -Restarted Jardiance and Aldactone   Essential hypertension -Restarted Aldactone, continue torsemide   Hypothyroidism TSH wnl 05/2021 Continue home synthroid at 185mg daily    OSA (obstructive sleep apnea) Continue cpap +oxygen at night      Consultants: Pulmonary, palliative care  Discharge Exam: Vitals:   04/08/22 0500 04/08/22 0839  BP: (!) 126/107 (!) 142/71  Pulse: 73 76  Resp: 20 18  Temp: 98.3 F (36.8 C) 97.7 F (36.5 C)   SpO2: 93% 95%   General: Obese chronically ill male sitting up in chair, AAOx3, no distress HEENT: No JVD CVS: S1-S2, regular rhythm Lungs: Diffuse scattered rhonchi Abdomen: Soft, nontender, bowel sounds present Extremities: No edema Skin: chronic skin changes  Discharge Instructions   Discharge Instructions     Diet - low sodium heart healthy   Complete by: As directed    Increase activity slowly   Complete by: As directed       Allergies as of 04/08/2022       Reactions   Amiodarone Other (See Comments)   Pulmonary toxicity        Medication List     TAKE these medications    apixaban 2.5 MG Tabs tablet Commonly known as: Eliquis Take 1 tablet (2.5 mg total) by mouth 2 (two) times daily.   benzonatate 200 MG capsule Commonly known as: TESSALON Take 1 capsule (200 mg total) by mouth 2 (two) times daily.   cefdinir 300 MG capsule Commonly known as: OMNICEF Take 1 capsule (300 mg total) by mouth every 12 (twelve) hours for 3 days.   cetirizine 10 MG tablet Commonly known as: ZYRTEC Take 10 mg by mouth daily as needed for allergies or rhinitis.   DRY EYES OP Apply 1 drop to eye 2 (two) times daily.   Gerhardt's butt cream Crea Apply 1 Application topically daily. Apply to groin pain   hydrALAZINE 50 MG tablet Commonly known as: APRESOLINE Take 1 tablet (50 mg total) by mouth 2 times daily at 12 noon and 4 pm.   Jardiance 10 MG Tabs tablet Generic drug: empagliflozin Take 1 tablet (10 mg total) by mouth daily.   levothyroxine 112 MCG tablet Commonly known as: SYNTHROID TAKE 1 TABLET BY MOUTH  DAILY BEFORE BREAKFAST   morphine CONCENTRATE 10 mg / 0.5 ml concentrated solution Place 0.25 mLs (5 mg total) under the tongue every 4 (four) hours as needed for shortness of breath or moderate pain (for airhunger, pain).   nitroGLYCERIN 0.4 MG SL tablet Commonly known as: NITROSTAT Place 1 tablet (0.4 mg total) under the tongue every 5 (five) minutes x 3  doses as needed for chest pain.   pantoprazole 40 MG tablet Commonly known as: PROTONIX Take 40 mg by mouth daily as needed (heartburn).   polyethylene glycol 17 g packet Commonly known as: MIRALAX / GLYCOLAX Take 17 g by mouth at bedtime.   potassium chloride SA 20 MEQ tablet Commonly known as: KLOR-CON M Take 1 tablet (20 mEq total) by mouth 2 (two) times daily.   pravastatin 20 MG tablet Commonly known as: PRAVACHOL TAKE 1 TABLET BY MOUTH  DAILY   predniSONE 20 MG tablet Commonly known as: DELTASONE Take 1 tablet (20 mg total) by mouth daily for 4 days. 10 day course What changed:  how much to take how to take this when to take this additional instructions   spironolactone 25 MG tablet Commonly known as: Aldactone Take 0.5 tablets (12.5 mg total) by mouth daily.   tamsulosin 0.4 MG Caps capsule Commonly known as: FLOMAX Take 0.4 mg by mouth at bedtime.   torsemide 20 MG tablet Commonly known as: DEMADEX Take 3 tablets (60 mg total) by mouth 2 (two) times daily.       Allergies  Allergen Reactions   Amiodarone Other (See Comments)    Pulmonary toxicity    Follow-up Information  [facility] Follow up.   Why: Dent referral- they will see you in the home Contact information: [address] [address] [phone]                  The results of significant diagnostics from this hospitalization (including imaging, microbiology, ancillary and laboratory) are listed below for reference.    Significant Diagnostic Studies: DG CHEST PORT 1 VIEW  Result Date: 04/05/2022 CLINICAL DATA:  Cough. EXAM: PORTABLE CHEST 1 VIEW COMPARISON:  April 04, 2022. FINDINGS: Stable cardiomediastinal silhouette. Status post coronary bypass graft. Left-sided pacemaker is unchanged in position. Stable right upper lobe airspace opacity is noted consistent with pneumonia. Minimal bibasilar subsegmental  atelectasis is noted. Bony thorax is unremarkable. IMPRESSION: Stable right upper lobe airspace opacity is noted concerning for pneumonia. Electronically Signed   By: Marijo Conception M.D.   On: 04/05/2022 10:21   CT Angio Chest PE W and/or Wo Contrast  Result Date: 04/04/2022 CLINICAL DATA:  2-3 day history of shortness of breath. EXAM: CT ANGIOGRAPHY CHEST WITH CONTRAST TECHNIQUE: Multidetector CT imaging of the chest was performed using the standard protocol during bolus administration of intravenous contrast. Multiplanar CT image reconstructions and MIPs were obtained to evaluate the vascular anatomy. RADIATION DOSE REDUCTION: This exam was performed according to the departmental dose-optimization program which includes automated exposure control, adjustment of the mA and/or kV according to patient size and/or use of iterative reconstruction technique. CONTRAST:  36mL OMNIPAQUE IOHEXOL 350 MG/ML SOLN COMPARISON:  Chest x-ray, same date. FINDINGS: Cardiovascular: The heart is upper limits of normal in size given the patient's age. No pericardial effusion. The aorta is normal in caliber. No dissection. Moderate to advanced atherosclerotic calcifications. Extensive coronary artery calcifications and evidence of prior coronary artery bypass surgery. Left atrial closure device noted. The pulmonary arteries are fairly well opacified. No filling defects to suggest pulmonary embolism. Mediastinum/Nodes: Right hilar mass and extensive mediastinal lymphadenopathy. 2.5 cm right paratracheal node on image 37/5. 2.5 cm subcarinal node on image 51/5. Extensive hilar and infrahilar adenopathy. The esophagus is grossly normal. Lungs/Pleura: Dense right upper lobe and right lower lobe airspace consolidation likely postobstructive pneumonitis but could not exclude some component interstitial spread of tumor. Underlying chronic bronchitic type changes. I do not see any obvious metastatic pulmonary nodules. There is a small right  pleural effusion noted. Upper Abdomen: No significant upper abdominal findings. No obvious hepatic metastatic disease. The adrenal glands are not included. Musculoskeletal: No significant bony findings. No supraclavicular or axillary adenopathy. Thyroid gland is unremarkable. Review of the MIP images confirms the above findings. IMPRESSION: 1. 4 x 3 cm right hilar mass invading the mediastinum. Associated extensive mediastinal lymphadenopathy and probable postobstructive pneumonitis and interstitial spread of tumor in the right upper lobe and right lower lobe. PET-CT suggested for further evaluation and referral to multi disciplinary thoracic Oncology clinic. Patient may require bronchoscopic biopsy for tissue diagnosis. 2. No CT findings for pulmonary embolism. 3. No findings for upper abdominal or osseous metastatic disease. Aortic Atherosclerosis (ICD10-I70.0) and Emphysema (ICD10-J43.9). Electronically Signed   By: Marijo Sanes M.D.   On: 04/04/2022 12:53   DG Chest Port 1 View  Result Date: 04/04/2022 CLINICAL DATA:  Shortness of breath. EXAM: PORTABLE CHEST 1 VIEW COMPARISON:  Chest x-ray dated March 20, 2022. FINDINGS: Unchanged left chest wall pacemaker. Stable cardiomegaly status post CABG and left atrial appendage clipping. Normal pulmonary vascularity. Continued focal consolidation in  the peripheral right upper lobe. No pleural effusion or pneumothorax. Unchanged bibasilar scarring. No acute osseous abnormality. IMPRESSION: 1. Persistent right upper lobe peripheral consolidation, not significantly changed over the past month. If the patient has been on appropriate antibiotic therapy during this time, CT of the chest is recommended for further evaluation. Electronically Signed   By: Titus Dubin M.D.   On: 04/04/2022 10:56   DG Chest 2 View  Result Date: 03/20/2022 CLINICAL DATA:  Dyspnea. EXAM: CHEST - 2 VIEW COMPARISON:  March 15, 2022 FINDINGS: Stable appearance of cardiac pacemaker and  postsurgical changes from CABG and atrial appendage clipping. Enlarged cardiac silhouette. Persistent airspace opacity in the right upper lobe. Diffusely coarsened interstitial markings. Osseous structures are without acute abnormality. Soft tissues are grossly normal. IMPRESSION: 1. Persistent airspace opacity in the right upper lobe. 2. Diffusely coarsened interstitial markings. 3. Enlarged cardiac silhouette. Electronically Signed   By: Fidela Salisbury M.D.   On: 03/20/2022 10:17   DG Chest Portable 1 View  Result Date: 03/15/2022 CLINICAL DATA:  Shortness of breath EXAM: PORTABLE CHEST 1 VIEW COMPARISON:  Chest x-ray dated March 04, 2022 FINDINGS: Left chest wall dual lead pacer. Cardiac and mediastinal contours are unchanged post median sternotomy and CABG. Persistent focal consolidation of the mid right lung. Background lower lung predominant heterogeneous opacities are slightly increased when compared with prior exam. Trace right pleural effusion. No evidence of pneumothorax. IMPRESSION: 1. Persistent focal consolidation of the right mid lung, possibly due to infection. Recommend radiographic follow-up to ensure resolution. 2. Increased background heterogeneous opacities, likely due to worsening pulmonary edema. Electronically Signed   By: Yetta Glassman M.D.   On: 03/15/2022 14:54    Microbiology: Recent Results (from the past 240 hour(s))  Resp Panel by RT-PCR (Flu A&B, Covid) Anterior Nasal Swab     Status: None   Collection Time: 04/04/22 10:00 AM   Specimen: Anterior Nasal Swab  Result Value Ref Range Status   SARS Coronavirus 2 by RT PCR NEGATIVE NEGATIVE Final    Comment: (NOTE) SARS-CoV-2 target nucleic acids are NOT DETECTED.  The SARS-CoV-2 RNA is generally detectable in upper respiratory specimens during the acute phase of infection. The lowest concentration of SARS-CoV-2 viral copies this assay can detect is 138 copies/mL. A negative result does not preclude  SARS-Cov-2 infection and should not be used as the sole basis for treatment or other patient management decisions. A negative result may occur with  improper specimen collection/handling, submission of specimen other than nasopharyngeal swab, presence of viral mutation(s) within the areas targeted by this assay, and inadequate number of viral copies(<138 copies/mL). A negative result must be combined with clinical observations, patient history, and epidemiological information. The expected result is Negative.  Fact Sheet for Patients:  EntrepreneurPulse.com.au  Fact Sheet for Healthcare Providers:  IncredibleEmployment.be  This test is no t yet approved or cleared by the [place] FDA and  has been authorized for detection and/or diagnosis of SARS-CoV-2 by FDA under an Emergency Use Authorization (EUA). This EUA will remain  in effect (meaning this test can be used) for the duration of the COVID-19 declaration under Section 564(b)(1) of the Act, 21 U.S.C.section 360bbb-3(b)(1), unless the authorization is terminated  or revoked sooner.       Influenza A by PCR NEGATIVE NEGATIVE Final   Influenza B by PCR NEGATIVE NEGATIVE Final    Comment: (NOTE) The Xpert Xpress SARS-CoV-2/FLU/RSV plus assay is intended as an aid in the diagnosis of influenza from  Nasopharyngeal swab specimens and should not be used as a sole basis for treatment. Nasal washings and aspirates are unacceptable for Xpert Xpress SARS-CoV-2/FLU/RSV testing.  Fact Sheet for Patients: EntrepreneurPulse.com.au  Fact Sheet for Healthcare Providers: IncredibleEmployment.be  This test is not yet approved or cleared by the [place] FDA and has been authorized for detection and/or diagnosis of SARS-CoV-2 by FDA under an Emergency Use Authorization (EUA). This EUA will remain in effect (meaning this test can be used) for the duration of  the COVID-19 declaration under Section 564(b)(1) of the Act, 21 U.S.C. section 360bbb-3(b)(1), unless the authorization is terminated or revoked.  Performed at [facility], [place] [address], [place], [place] [address]   Respiratory (~20 pathogens) panel by PCR     Status: Abnormal   Collection Time: 04/04/22 10:00 AM   Specimen: Nasopharyngeal Swab; Respiratory  Result Value Ref Range Status   Adenovirus NOT DETECTED NOT DETECTED Final   Coronavirus 229E NOT DETECTED NOT DETECTED Final    Comment: (NOTE) The Coronavirus on the Respiratory Panel, DOES NOT test for the novel  Coronavirus (2019 nCoV)    Coronavirus HKU1 NOT DETECTED NOT DETECTED Final   Coronavirus NL63 NOT DETECTED NOT DETECTED Final   Coronavirus OC43 NOT DETECTED NOT DETECTED Final   Metapneumovirus NOT DETECTED NOT DETECTED Final   Rhinovirus / Enterovirus DETECTED (A) NOT DETECTED Final   Influenza A NOT DETECTED NOT DETECTED Final   Influenza B NOT DETECTED NOT DETECTED Final   Parainfluenza Virus 1 NOT DETECTED NOT DETECTED Final   Parainfluenza Virus 2 NOT DETECTED NOT DETECTED Final   Parainfluenza Virus 3 NOT DETECTED NOT DETECTED Final   Parainfluenza Virus 4 NOT DETECTED NOT DETECTED Final   Respiratory Syncytial Virus NOT DETECTED NOT DETECTED Final   Bordetella pertussis NOT DETECTED NOT DETECTED Final   Bordetella Parapertussis NOT DETECTED NOT DETECTED Final   Chlamydophila pneumoniae NOT DETECTED NOT DETECTED Final   Mycoplasma pneumoniae NOT DETECTED NOT DETECTED Final    Comment: Performed at [facility], [address] [place], [place] [address]  Culture, blood (routine x 2)     Status: None   Collection Time: 04/04/22 10:33 AM   Specimen: BLOOD  Result Value Ref Range Status   Specimen Description BLOOD RIGHT ANTECUBITAL  Final   Special Requests   Final    BOTTLES DRAWN AEROBIC AND ANAEROBIC Blood Culture results may not be optimal due to an excessive volume of blood received  in culture bottles   Culture   Final    NO GROWTH 5 DAYS Performed at [facility], [place] [address], [place], [place] [address]    Report Status 04/09/2022 FINAL  Final  Culture, blood (routine x 2)     Status: None   Collection Time: 04/04/22 10:43 AM   Specimen: BLOOD LEFT FOREARM  Result Value Ref Range Status   Specimen Description BLOOD LEFT FOREARM  Final   Special Requests   Final    BOTTLES DRAWN AEROBIC AND ANAEROBIC Blood Culture adequate volume   Culture   Final    NO GROWTH 5 DAYS Performed at [facility], [place] [address] [place],  [address]    Report Status 04/09/2022 FINAL  Final  Expectorated Sputum Assessment w Gram Stain, Rflx to Resp Cult     Status: None   Collection Time: 04/05/22  8:31 AM   Specimen: Sputum  Result Value Ref Range Status   Specimen Description SPUTUM  Final   Special Requests NONE  Final   Sputum evaluation   Final    THIS SPECIMEN IS ACCEPTABLE FOR SPUTUM CULTURE Performed at [facility], [address], [place], [place] [address]    Report Status 04/05/2022 FINAL  Final  Culture, Respiratory w Gram Stain     Status: None   Collection Time: 04/05/22  8:31 AM   Specimen: SPU  Result Value Ref Range Status   Specimen Description SPUTUM  Final   Special Requests NONE Reflexed from [id number]  Final   Gram Stain   Final    ABUNDANT WBC PRESENT,BOTH PMN AND MONONUCLEAR RARE GRAM POSITIVE COCCI IN PAIRS    Culture   Final    RARE Consistent with normal respiratory flora. Performed at [facility], [place] [address] [place], [place] [address]    Report Status 04/07/2022 FINAL  Final     Labs: Basic Metabolic Panel: Recent Labs  Lab 04/04/22 1009 04/04/22 1032 04/05/22 0307 04/06/22 0152 04/07/22 0254 04/08/22 0243  NA 137 136 137 134* 134* 140  K 3.8 3.7 4.6 4.2 4.0 4.4  CL 95*  --  94* 95* 95* 92*  CO2 28  --  32 _0 GLUCOSE 169*  --  120* 134* 135* 146*  BUN 40*  --  36* 41* 43* 46*   CREATININE 1.88*  --  1.71* 1.62* 1.49* 1.58*  CALCIUM 8.8*  --  9.1 8.7* 8.9 8.9   Liver Function Tests: Recent Labs  Lab 04/04/22 1009  AST 39  ALT 36  ALKPHOS 95  BILITOT 0.9  PROT 6.9  ALBUMIN 3.3*   No results for input(s): "LIPASE", "AMYLASE" in the last 168 hours. No results for input(s): "AMMONIA" in the last 168 hours. CBC: Recent Labs  Lab 04/04/22 1009 04/04/22 1032 04/05/22 0307 04/06/22 0152 04/08/22 0243  WBC 16.4*  --  13.1* 14.9* 13.6*  HGB 15.6 16.3 14.5 14.2 14.8  HCT 47.9 48.0 43.5 44.3 45.2  MCV 95.2  --  93.8 95.3 94.8  PLT 249  --  220 218 219   Cardiac Enzymes: No results for input(s): "CKTOTAL", "CKMB", "CKMBINDEX", "TROPONINI" in the last 168 hours. BNP: BNP (last 3 results) Recent Labs    03/15/22 1417 03/27/22 1315 04/04/22 1009  BNP 64.6 62.0 84.5    ProBNP (last 3 results) Recent Labs    05/11/21 1025  PROBNP 1,997*    CBG: Recent Labs  Lab 04/05/22 1732 04/06/22 1150  GLUCAP 155* 114*       Signed:  Domenic Polite MD.  [facility] 04/09/2022, 2:52 PM

## 2022-04-10 ENCOUNTER — Ambulatory Visit (INDEPENDENT_AMBULATORY_CARE_PROVIDER_SITE_OTHER): Payer: Medicare Other

## 2022-04-10 DIAGNOSIS — I255 Ischemic cardiomyopathy: Secondary | ICD-10-CM

## 2022-04-10 LAB — CUP PACEART REMOTE DEVICE CHECK
Battery Remaining Longevity: 31 mo
Battery Remaining Percentage: 27 %
Battery Voltage: 2.93 V
Brady Statistic AP VP Percent: 96 %
Brady Statistic AP VS Percent: 1 %
Brady Statistic AS VP Percent: 3.9 %
Brady Statistic AS VS Percent: 1 %
Brady Statistic RA Percent Paced: 9.2 %
Brady Statistic RV Percent Paced: 99 %
Date Time Interrogation Session: 20230809074554
Implantable Lead Implant Date: 20170202
Implantable Lead Implant Date: 20170202
Implantable Lead Location: 753859
Implantable Lead Location: 753860
Implantable Pulse Generator Implant Date: 20170202
Lead Channel Impedance Value: 400 Ohm
Lead Channel Impedance Value: 440 Ohm
Lead Channel Pacing Threshold Amplitude: 0.75 V
Lead Channel Pacing Threshold Amplitude: 1.25 V
Lead Channel Pacing Threshold Pulse Width: 0.4 ms
Lead Channel Pacing Threshold Pulse Width: 0.4 ms
Lead Channel Sensing Intrinsic Amplitude: 1.1 mV
Lead Channel Sensing Intrinsic Amplitude: 12 mV
Lead Channel Setting Pacing Amplitude: 1 V
Lead Channel Setting Pacing Amplitude: 2.5 V
Lead Channel Setting Pacing Pulse Width: 0.4 ms
Lead Channel Setting Sensing Sensitivity: 2 mV
Pulse Gen Model: 2240
Pulse Gen Serial Number: 7864186

## 2022-04-17 ENCOUNTER — Ambulatory Visit: Payer: Medicare Other | Admitting: Physician Assistant

## 2022-04-23 NOTE — Telephone Encounter (Signed)
Called patients daughter [name] to share condolences for father She has remote interrogation equipment   Wants to know what to do with it   Told her I would forward to EP

## 2022-04-24 ENCOUNTER — Other Ambulatory Visit: Payer: Self-pay | Admitting: Internal Medicine

## 2022-04-25 NOTE — Telephone Encounter (Signed)
Called daughter, [name] to offer condolences. Richardson Dopp, PA-C    04/25/2022 5:23 PM

## 2022-05-03 DEATH — deceased

## 2022-05-14 NOTE — Progress Notes (Signed)
Remote pacemaker transmission.   

## 2022-06-20 ENCOUNTER — Ambulatory Visit: Payer: Medicare Other | Admitting: Internal Medicine
